# Patient Record
Sex: Female | Born: 1945 | Race: White | Hispanic: No | Smoking: Former smoker
Health system: Southern US, Community
[De-identification: ages and names within clinical notes are randomized; demographics above are authoritative.]

## PROBLEM LIST (undated history)

## (undated) DIAGNOSIS — I959 Hypotension, unspecified: Secondary | ICD-10-CM

## (undated) DIAGNOSIS — I509 Heart failure, unspecified: Secondary | ICD-10-CM

## (undated) DIAGNOSIS — E119 Type 2 diabetes mellitus without complications: Secondary | ICD-10-CM

## (undated) DIAGNOSIS — J90 Pleural effusion, not elsewhere classified: Secondary | ICD-10-CM

## (undated) DIAGNOSIS — J449 Chronic obstructive pulmonary disease, unspecified: Secondary | ICD-10-CM

## (undated) DIAGNOSIS — D649 Anemia, unspecified: Secondary | ICD-10-CM

## (undated) DIAGNOSIS — C801 Malignant (primary) neoplasm, unspecified: Secondary | ICD-10-CM

## (undated) HISTORY — DX: Chronic obstructive pulmonary disease, unspecified: J44.9

## (undated) HISTORY — PX: TONSILLECTOMY: SUR1361

## (undated) HISTORY — DX: Pleural effusion, not elsewhere classified: J90

## (undated) HISTORY — DX: Hypotension, unspecified: I95.9

## (undated) HISTORY — DX: Anemia, unspecified: D64.9

## (undated) HISTORY — DX: Heart failure, unspecified: I50.9

---

## 2016-09-21 ENCOUNTER — Emergency Department
Admission: EM | Admit: 2016-09-21 | Discharge: 2016-09-21 | Disposition: A | Discharge status: Home / Self Care | Payer: Medicare Other | Attending: Emergency Medicine | Admitting: Emergency Medicine

## 2016-09-21 ENCOUNTER — Encounter: Payer: Self-pay | Admitting: Emergency Medicine

## 2016-09-21 DIAGNOSIS — E119 Type 2 diabetes mellitus without complications: Secondary | ICD-10-CM | POA: Insufficient documentation

## 2016-09-21 DIAGNOSIS — M62830 Muscle spasm of back: Secondary | ICD-10-CM | POA: Insufficient documentation

## 2016-09-21 DIAGNOSIS — M545 Low back pain, unspecified: Secondary | ICD-10-CM

## 2016-09-21 DIAGNOSIS — F17210 Nicotine dependence, cigarettes, uncomplicated: Secondary | ICD-10-CM | POA: Insufficient documentation

## 2016-09-21 HISTORY — DX: Malignant (primary) neoplasm, unspecified: C80.1

## 2016-09-21 HISTORY — DX: Type 2 diabetes mellitus without complications: E11.9

## 2016-09-21 MED ORDER — KETOROLAC TROMETHAMINE 30 MG/ML IJ SOLN
30.0000 mg | Freq: Once | INTRAMUSCULAR | Status: AC
  Administered 2016-09-21: 30 mg via INTRAMUSCULAR
  Filled 2016-09-21: qty 1

## 2016-09-21 MED ORDER — BACLOFEN 10 MG PO TABS: 10.0000 mg | ORAL_TABLET | Freq: Three times a day (TID) | ORAL | 0 refills | Status: AC | PRN

## 2016-09-21 MED ORDER — ORPHENADRINE CITRATE 30 MG/ML IJ SOLN
60.0000 mg | Freq: Two times a day (BID) | INTRAMUSCULAR | Status: DC
  Administered 2016-09-21: 60 mg via INTRAMUSCULAR
  Filled 2016-09-21: qty 2

## 2016-09-21 MED ORDER — NAPROXEN 500 MG PO TABS: 500.0000 mg | ORAL_TABLET | Freq: Two times a day (BID) | ORAL | 0 refills | Status: DC

## 2016-09-21 NOTE — ED Triage Notes (Signed)
Patient presents to the ED with back pain.  Patient states, "it started in the middle and now it's all over."  Patient states pain began approx. 1 week ago.  Patient states back pain increases with coughing.

## 2016-09-21 NOTE — ED Notes (Addendum)
Pt reports back pain (entire back) for the last week - pt denies heavy lifting/falls/injuries - pt reports she has never had this pain before - pain is severe enough to hamper sleep and causes difficulty walking - pt denies urinary pain or frequency - pt states the pain is worse with coughing - pt has had a "cold" for the past week with a non-productive cough

## 2016-09-21 NOTE — ED Provider Notes (Signed)
[facility] Emergency Department Provider Note  ____________________________________________  Time seen: Approximately 12:35 PM  I have reviewed the triage vital signs and the nursing notes.   HISTORY  Chief Complaint Back Pain    HPI Natasha [name] is a 70 y.o. female , NAD, presents to the emergency department with 1 week history of back pain. Patient states she had back pain began about the middle section of her back approximately one week ago. Pain has been consistently worsening and spreading up and down her back since that time. Denies any injuries, traumas or falls. Has not noted any rashes, redness or swelling. Has been taking Excedrin over-the-counter which has not been assisting with her pain. Also notes she has had a cold over the last week and the cough seems to make the pain worse. Patient denies chest pain, shortness breath or wheeze. Said no abdominal pain, nausea or vomiting. Denies any numbness, weakness, tingling.   Past Medical History:  Diagnosis Date  . Cancer ([place])    lymphoma  . Diabetes mellitus without complication ([place])     There are no active problems to display for this patient.   History reviewed. No pertinent surgical history.  Prior to Admission medications   Medication Sig Start Date End Date Taking? Authorizing Provider  baclofen (LIORESAL) 10 MG tablet Take 1 tablet (10 mg total) by mouth 3 (three) times daily as needed for muscle spasms. 09/21/16 09/28/16  Stephonie Wilcoxen L Madasyn Heath, PA-C  naproxen (NAPROSYN) 500 MG tablet Take 1 tablet (500 mg total) by mouth 2 (two) times daily with a meal. 09/21/16   Kijana Cromie L Davin Muramoto, PA-C    Allergies Patient has no known allergies.  No family history on file.  Social History Social History  Substance Use Topics  . Smoking status: Current Every Day Smoker    Packs/day: 0.50    Types: Cigarettes  . Smokeless tobacco: Never Used  . Alcohol use No     Review of Systems  Constitutional: No  fever/chills ENT: Positive nasal congestion, runny nose. No ear pain, sinus pressure. Cardiovascular: No chest pain. Respiratory: Positive cough without chest congestion No shortness of breath. No wheezing.  Gastrointestinal: No abdominal pain.  No nausea, vomiting.   Musculoskeletal: Positive for back pain.  Skin: Negative for rash. Neurological: Negative for numbness, weakness, tingling.Marland Kitchen 10-point ROS otherwise negative.  ____________________________________________   PHYSICAL EXAM:  VITAL SIGNS: ED Triage Vitals  Enc Vitals Group     BP 09/21/16 1200 127/89     Pulse Rate 09/21/16 1200 95     Resp 09/21/16 1200 20     Temp 09/21/16 1200 98.6 F (37 C)     Temp Source 09/21/16 1200 Oral     SpO2 09/21/16 1200 98 %     Weight 09/21/16 1201 200 lb (90.7 kg)     Height 09/21/16 1201 5\' 5"  (1.651 m)     Head Circumference --      Peak Flow --      Pain Score 09/21/16 1201 8     Pain Loc --      Pain Edu? --      Excl. in [place]? --     Constitutional: Alert and oriented. Well appearing and in no acute distress. Eyes: Conjunctivae are normal Without icterus or injection Head: Atraumatic. ENT:      Nose: No congestion/rhinnorhea.      Mouth/Throat: Mucous membranes are moist.  Neck: Supple with full range of motion. No cervical spine  tenderness to palpation. No trapezial muscle spasms. Hematological/Lymphatic/Immunilogical: No cervical lymphadenopathy. Cardiovascular: Normal rate, regular rhythm. Normal S1 and S2. No murmurs, rubs, gallops. Good peripheral circulation. Respiratory: Normal respiratory effort without tachypnea or retractions. Lungs CTAB with breath sounds noted in all lung fields. No wheeze, rhonchi, rales. Musculoskeletal: No tenderness to palpation of the central thoracic, lumbar or sacral regions. Mid to lower thoracic muscle spasm about the right side is noted with mild tenderness to palpation. Full range of motion of the lumbar spine without pain or  difficulty. Full range of motion of bilateral upper and lower extremities without pain or difficulty. No lower extremity tenderness nor edema.  No joint effusions. Neurologic:  Normal speech and language. Normal gait and posture. No gross focal neurologic deficits are appreciated.  Skin:  Skin is warm, dry and intact. No rash, redness, swelling, skin sores or bruising noted. Psychiatric: Mood and affect are normal. Speech and behavior are normal. Patient exhibits appropriate insight and judgement.   ____________________________________________   LABS  None ____________________________________________  EKG  None ____________________________________________  RADIOLOGY  None ____________________________________________    PROCEDURES  Procedure(s) performed: None   Procedures   Medications  orphenadrine (NORFLEX) injection 60 mg (60 mg Intramuscular Given 09/21/16 1259)  ketorolac (TORADOL) 30 MG/ML injection 30 mg (30 mg Intramuscular Given 09/21/16 1259)     ____________________________________________   INITIAL IMPRESSION / ASSESSMENT AND PLAN / ED COURSE  Pertinent labs & imaging results that were available during my care of the patient were reviewed by me and considered in my medical decision making (see chart for details).  Clinical Course     Patient's diagnosis is consistent with Acute lower back pain with muscle spasms. Patient was given Toradol and Norflex while in the emergency department and noted significant decrease in pain. Patient will be discharged home with prescriptions for baclofen and Naprosyn to take as directed. Patient is to follow up with [facility] if symptoms persist past this treatment course. Patient is given ED precautions to return to the ED for any worsening or new symptoms.   ____________________________________________  FINAL CLINICAL IMPRESSION(S) / ED DIAGNOSES  Final diagnoses:  Acute bilateral low back pain without  sciatica  Muscle spasm of back      NEW MEDICATIONS STARTED DURING THIS VISIT:  Discharge Medication List as of 09/21/2016  1:53 PM    START taking these medications   Details  baclofen (LIORESAL) 10 MG tablet Take 1 tablet (10 mg total) by mouth 3 (three) times daily as needed for muscle spasms., Starting Tue 09/21/2016, Until Tue 09/28/2016, Print    naproxen (NAPROSYN) 500 MG tablet Take 1 tablet (500 mg total) by mouth 2 (two) times daily with a meal., Starting Tue 09/21/2016, [place], PA-C 09/21/16 1422    Carrie Mew, MD 09/22/16 1609

## 2016-10-03 ENCOUNTER — Emergency Department
Admission: EM | Admit: 2016-10-03 | Discharge: 2016-10-03 | Disposition: A | Discharge status: Home / Self Care | Payer: Medicare Other | Attending: Emergency Medicine | Admitting: Emergency Medicine

## 2016-10-03 ENCOUNTER — Emergency Department: Payer: Medicare Other

## 2016-10-03 ENCOUNTER — Encounter: Payer: Self-pay | Admitting: Emergency Medicine

## 2016-10-03 DIAGNOSIS — E119 Type 2 diabetes mellitus without complications: Secondary | ICD-10-CM | POA: Diagnosis not present

## 2016-10-03 DIAGNOSIS — M47816 Spondylosis without myelopathy or radiculopathy, lumbar region: Secondary | ICD-10-CM | POA: Diagnosis not present

## 2016-10-03 DIAGNOSIS — Z8572 Personal history of non-Hodgkin lymphomas: Secondary | ICD-10-CM | POA: Insufficient documentation

## 2016-10-03 DIAGNOSIS — Z791 Long term (current) use of non-steroidal anti-inflammatories (NSAID): Secondary | ICD-10-CM | POA: Insufficient documentation

## 2016-10-03 DIAGNOSIS — F17210 Nicotine dependence, cigarettes, uncomplicated: Secondary | ICD-10-CM | POA: Insufficient documentation

## 2016-10-03 DIAGNOSIS — M461 Sacroiliitis, not elsewhere classified: Secondary | ICD-10-CM | POA: Diagnosis not present

## 2016-10-03 DIAGNOSIS — M549 Dorsalgia, unspecified: Secondary | ICD-10-CM

## 2016-10-03 DIAGNOSIS — M545 Low back pain: Secondary | ICD-10-CM | POA: Diagnosis present

## 2016-10-03 MED ORDER — KETOROLAC TROMETHAMINE 60 MG/2ML IM SOLN
30.0000 mg | Freq: Once | INTRAMUSCULAR | Status: AC
  Administered 2016-10-03: 30 mg via INTRAMUSCULAR
  Filled 2016-10-03: qty 2

## 2016-10-03 MED ORDER — PREDNISONE 10 MG PO TABS: 40.0000 mg | ORAL_TABLET | Freq: Every day | ORAL | 0 refills | Status: AC

## 2016-10-03 MED ORDER — MELOXICAM 7.5 MG PO TABS: 7.5000 mg | ORAL_TABLET | Freq: Every day | ORAL | 0 refills | Status: DC

## 2016-10-03 NOTE — ED Triage Notes (Signed)
C/O low back pain.  Onset of symptoms day after thanksgiving.  Has been seen through ED for same and was given Naprosyn, muscle relaxers.   Medications helped pain, but patient has run out of medication.  Has not followed up with PCP

## 2016-10-03 NOTE — ED Provider Notes (Signed)
[facility] Emergency Department Provider Note  ____________________________________________  Time seen: Approximately 4:28 PM  I have reviewed the triage vital signs and the nursing notes.   HISTORY  Chief Complaint Back Pain    HPI Natasha [name] is a 70 y.o. female that presents with low back pain for 2 weeks. Patient states that she has back pain throughout lower back. Pain does not radiate. Pain is worse when standing up. Patient is not having any difficulty walking. Patient denies any trauma. Patient was seen in ED previously and was given baclofen and naproxen for symptoms. Patient states that these helped significanty but she has run out. Patient has not followed up with a primary care doctor. Patient denies incontinence, numbness or tingling, leg pain, nausea, vomiting, fever, weight loss.   Past Medical History:  Diagnosis Date  . 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 Diabetes mellitus without complication ([place])     There are no active problems to display for this patient.   History reviewed. No pertinent surgical history.  Prior to Admission medications   Medication Sig Start Date End Date Taking? Authorizing Provider  meloxicam (MOBIC) 7.5 MG tablet Take 1 tablet (7.5 mg total) by mouth daily. 10/03/16   Laban Emperor, PA-C  naproxen (NAPROSYN) 500 MG tablet Take 1 tablet (500 mg total) by mouth 2 (two) times daily with a meal. 09/21/16   Jami L Hagler, PA-C  predniSONE (DELTASONE) 10 MG tablet Take 4 tablets (40 mg total) by mouth daily. 10/03/16 10/08/16  Laban Emperor, PA-C    Allergies Patient has no known allergies.  No family history on file.  Social History Social History  Substance Use Topics  . Smoking status: Current Every Day Smoker    Packs/day: 0.50    Types: Cigarettes  . Smokeless tobacco: Never Used  . Alcohol use No     Review of Systems  Constitutional: No fever/chills Eyes: No visual changes. No discharge ENT: No  upper respiratory complaints. Cardiovascular: no chest pain. Respiratory: no cough. No SOB. Gastrointestinal: No abdominal pain.  No nausea, no vomiting.  No diarrhea.  No constipation. Genitourinary: Negative for dysuria.  Skin: Negative for rash, abrasions, lacerations, ecchymosis. Neurological: Negative for headaches, focal weakness or numbness.   ____________________________________________   PHYSICAL EXAM:  VITAL SIGNS: ED Triage Vitals  Enc Vitals Group     BP 10/03/16 1314 (!) 148/66     Pulse Rate 10/03/16 1314 82     Resp 10/03/16 1314 16     Temp 10/03/16 1314 98 F (36.7 C)     Temp Source 10/03/16 1314 Oral     SpO2 10/03/16 1314 100 %     Weight 10/03/16 1312 200 lb (90.7 kg)     Height 10/03/16 1312 5\' 5"  (1.651 m)     Head Circumference --      Peak Flow --      Pain Score 10/03/16 1312 10     Pain Loc --      Pain Edu? --      Excl. in [place]? --      Constitutional: Alert and oriented. Well appearing and in no acute distress. Eyes: Conjunctivae are normal. PERRL. EOMI. Head: Atraumatic. ENT:      Ears:       Nose: No congestion/rhinnorhea.      Mouth/Throat: Mucous membranes are moist.  Neck: No stridor. Cardiovascular: Normal rate, regular rhythm. Normal S1 and S2.  Good peripheral circulation. Respiratory: Normal respiratory  effort without tachypnea or retractions. Lungs CTAB. Good air entry to the bases with no decreased or absent breath sounds. Gastrointestinal: Bowel sounds 4 quadrants. Soft and nontender to palpation.  Musculoskeletal: No tenderness to palpation over cervical, thoracic, lumbar, or sacral spine. Patient has tenderness to palpation over right SI joint. No tenderness to palpation over left SI joint. And has full range of motion of upper and lower extremities. No difficulty walking. No joint effusions. Positive straight leg raise. Neurologic:  Normal speech and language. No gross focal neurologic deficits are appreciated.  Skin:  Skin  is warm, dry and intact. No rash noted. Psychiatric: Mood and affect are normal. Speech and behavior are normal. Patient exhibits appropriate insight and judgement.   ____________________________________________   LABS (all labs ordered are listed, but only abnormal results are displayed)  Labs Reviewed - No data to display ____________________________________________  EKG   ____________________________________________  RADIOLOGY Robinette Haines, personally viewed and evaluated these images (plain radiographs) as part of my medical decision making, as well as reviewing the written report by the radiologist.  Dg Lumbar Spine 2-3 Views  Result Date: 10/03/2016 CLINICAL DATA:  Low back pain, no known injury, initial encounter EXAM: LUMBAR SPINE - 3 VIEW COMPARISON:  None. FINDINGS: Five lumbar type vertebral bodies are well visualized. Vertebral body height is well maintained. Mild osteophytic changes are seen worst at the L1-2 level. No soft tissue abnormality is noted. IMPRESSION: Mild degenerative change without acute abnormality. Electronically Signed   By: Inez Catalina M.D.   On: 10/03/2016 16:10    ____________________________________________    PROCEDURES  Procedure(s) performed:    Procedures    Medications  ketorolac (TORADOL) injection 30 mg (30 mg Intramuscular Given 10/03/16 1607)     ____________________________________________   INITIAL IMPRESSION / ASSESSMENT AND PLAN / ED COURSE  Pertinent labs & imaging results that were available during my care of the patient were reviewed by me and considered in my medical decision making (see chart for details).  Review of the [place] CSRS was performed in accordance of the [place] prior to dispensing any controlled drugs.  Clinical Course     Patient's diagnosis is consistent with osteoarthritis and SI joint inflammation without radiculopathy. Patient was given Toradol in emergency department  and states that her  pain resolved. Patient will be discharged home with prescriptions for prednisone and meloxicam. Patient is to follow up with [facility] as needed or otherwise directed. Patient is given ED precautions to return to the ED for any worsening or new symptoms. No evidence of cauda hematoma, vascular catastrophe, spinal abscess/hematoma, or referred intra-abdominal pain. All patients questions questions were answered.    ____________________________________________  FINAL CLINICAL IMPRESSION(S) / ED DIAGNOSES  Final diagnoses:  Back pain  Osteoarthritis of lumbar spine, unspecified spinal osteoarthritis complication status  SI (sacroiliac) joint inflammation ([facility])      NEW MEDICATIONS STARTED DURING THIS VISIT:  New Prescriptions   MELOXICAM (MOBIC) 7.5 MG TABLET    Take 1 tablet (7.5 mg total) by mouth daily.   PREDNISONE (DELTASONE) 10 MG TABLET    Take 4 tablets (40 mg total) by mouth daily.        This chart was dictated using voice recognition software/Dragon. Despite best efforts to proofread, errors can occur which can change the meaning. Any change was purely unintentional.   Laban Emperor, PA-C 10/03/16 1810    Delman Kitten, MD 10/10/16 [phone]

## 2017-08-22 ENCOUNTER — Emergency Department: Payer: Medicare Other

## 2017-08-22 ENCOUNTER — Emergency Department
Admission: EM | Admit: 2017-08-22 | Discharge: 2017-08-23 | Disposition: A | Discharge status: Home / Self Care | Payer: Medicare Other | Attending: Emergency Medicine | Admitting: Emergency Medicine

## 2017-08-22 DIAGNOSIS — Y9389 Activity, other specified: Secondary | ICD-10-CM | POA: Insufficient documentation

## 2017-08-22 DIAGNOSIS — F17210 Nicotine dependence, cigarettes, uncomplicated: Secondary | ICD-10-CM | POA: Insufficient documentation

## 2017-08-22 DIAGNOSIS — S6991XA Unspecified injury of right wrist, hand and finger(s), initial encounter: Secondary | ICD-10-CM | POA: Diagnosis present

## 2017-08-22 DIAGNOSIS — Y92009 Unspecified place in unspecified non-institutional (private) residence as the place of occurrence of the external cause: Secondary | ICD-10-CM | POA: Diagnosis not present

## 2017-08-22 DIAGNOSIS — Z8572 Personal history of non-Hodgkin lymphomas: Secondary | ICD-10-CM | POA: Insufficient documentation

## 2017-08-22 DIAGNOSIS — E119 Type 2 diabetes mellitus without complications: Secondary | ICD-10-CM | POA: Diagnosis not present

## 2017-08-22 DIAGNOSIS — Y999 Unspecified external cause status: Secondary | ICD-10-CM | POA: Insufficient documentation

## 2017-08-22 DIAGNOSIS — W2209XA Striking against other stationary object, initial encounter: Secondary | ICD-10-CM | POA: Diagnosis not present

## 2017-08-22 MED ORDER — MELOXICAM 7.5 MG PO TABS
7.5000 mg | ORAL_TABLET | Freq: Once | ORAL | Status: AC
  Administered 2017-08-22: 7.5 mg via ORAL
  Filled 2017-08-22: qty 1

## 2017-08-22 MED ORDER — MELOXICAM 7.5 MG PO TABS: 7.5000 mg | ORAL_TABLET | Freq: Every day | ORAL | 0 refills | Status: DC

## 2017-08-22 NOTE — ED Notes (Signed)

## 2017-08-22 NOTE — ED Notes (Signed)
Pt states around last Thursday she got her hand caught down in the couch and pulled it out too quickly. States R pointer and middle finger pain. Those 2 fingers are swollen. Pt able to move slightly but not bend fully. Alert, oriented, ambulatory. Has not used ice or pain medication. States she put vicks vapor rub on it.

## 2017-08-22 NOTE — ED Provider Notes (Signed)
[facility] Emergency Department Provider Note  ____________________________________________  Time seen: Approximately 11:53 PM  I have reviewed the triage vital signs and the nursing notes.   HISTORY  Chief Complaint Hand Injury    HPI Natasha [name] is a 71 y.o. female who presents emergency department complaining of second digit pain to the right hand.  Patient reports that she was trying to get her cell phone out between 2 questions on the couch 3 days ago.  She reports that she tried to pull her hand out to quickly and injured her second finger.  Patient has had pain, swelling, ecchymosis that is now turning a "yellowish" color since the injury.  Patient reports that she has limited range of motion to the second digit of her right hand.  No numbness or tingling.  Patient has tried Vicks rubs topically but has not taken any other medication for this complaint.  No other injury or complaint at this time.  Past Medical History:  Diagnosis Date  . 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 Diabetes mellitus without complication ([place])     There are no active problems to display for this patient.   No past surgical history on file.  Prior to Admission medications   Medication Sig Start Date End Date Taking? Authorizing Provider  meloxicam (MOBIC) 7.5 MG tablet Take 1 tablet (7.5 mg total) by mouth daily. 08/22/17 08/22/18  Mikaili Flippin, Charline Bills, PA-C    Allergies Patient has no known allergies.  No family history on file.  Social History Social History  Substance Use Topics  . Smoking status: Current Every Day Smoker    Packs/day: 0.50    Types: Cigarettes  . Smokeless tobacco: Never Used  . Alcohol use No     Review of Systems  Constitutional: No fever/chills Eyes: No visual changes.  Cardiovascular: no chest pain. Respiratory: no cough. No SOB. Gastrointestinal: No abdominal pain.  No nausea, no vomiting.  Musculoskeletal: Positive for pain, swelling,  ecchymosis to the second digit of the right hand Skin: Negative for rash, abrasions, lacerations, ecchymosis. Neurological: Negative for headaches, focal weakness or numbness. 10-point ROS otherwise negative.  ____________________________________________   PHYSICAL EXAM:  VITAL SIGNS: ED Triage Vitals  Enc Vitals Group     BP 08/22/17 2148 128/85     Pulse Rate 08/22/17 2148 89     Resp 08/22/17 2148 20     Temp 08/22/17 2148 98.7 F (37.1 C)     Temp Source 08/22/17 2148 Oral     SpO2 08/22/17 2148 99 %     Weight 08/22/17 2146 210 lb (95.3 kg)     Height 08/22/17 2146 5\' 5"  (1.651 m)     Head Circumference --      Peak Flow --      Pain Score 08/22/17 2146 10     Pain Loc --      Pain Edu? --      Excl. in [place]? --      Constitutional: Alert and oriented. Well appearing and in no acute distress. Eyes: Conjunctivae are normal. PERRL. EOMI. Head: Atraumatic. Neck: No stridor.    Cardiovascular: Normal rate, regular rhythm. Normal S1 and S2.  Good peripheral circulation. Respiratory: Normal respiratory effort without tachypnea or retractions. Lungs CTAB. Good air entry to the bases with no decreased or absent breath sounds. Musculoskeletal: Full range of motion to all extremities. No gross deformities appreciated.  Patient has edema to the second and third digit  of the right hand.  Resolving ecchymosis is appreciated to the second and third digit. full range of motion to the first, third, fourth, fith digits.  The patient has good extension of the second digit but limited flexion.  Patient is very tender to palpation over the MCP joint and the PIP joint of the second digit.  All tenderness to palpation carcinoma dorsal aspect.  Patient is nontender to palpation over the flexor tendon distribution.  No palpable abnormality.  Refill and sensation intact distal digit.  There are no lacerations to the hand or digit.  No other tenderness to palpation. Neurologic:  Normal speech and  language. No gross focal neurologic deficits are appreciated.  Skin:  Skin is warm, dry and intact. No rash noted. Psychiatric: Mood and affect are normal. Speech and behavior are normal. Patient exhibits appropriate insight and judgement.   ____________________________________________   LABS (all labs ordered are listed, but only abnormal results are displayed)  Labs Reviewed - No data to display ____________________________________________  EKG   ____________________________________________  RADIOLOGY Natasha [name], personally viewed and evaluated these images (plain radiographs) as part of my medical decision making, as well as reviewing the written report by the radiologist.  Dg Hand Complete Right  Result Date: 08/22/2017 CLINICAL DATA:  71 year old female with trauma to the right hand with pain over the right index finger. EXAM: RIGHT HAND - COMPLETE 3+ VIEW COMPARISON:  None. FINDINGS: There is a faint linear oblique lucency in the proximal aspect of the fourth metacarpal, likely representing an old healed fracture. Correlation with clinical exam and point tenderness over this area is recommended. No other acute fracture identified. The bones are osteopenic. There is no dislocation. The soft tissues are grossly unremarkable. IMPRESSION: No definite acute fracture. Probable old healed fracture of the proximal fourth metacarpal. Correlation with clinical exam and point tenderness recommended. No dislocation. Electronically Signed   By: Anner Crete M.D.   On: 08/22/2017 22:21    ____________________________________________    PROCEDURES  Procedure(s) performed:    Procedures    Medications  meloxicam (MOBIC) tablet 7.5 mg (7.5 mg Oral Given 08/22/17 2353)     ____________________________________________   INITIAL IMPRESSION / ASSESSMENT AND PLAN / ED COURSE  Pertinent labs & imaging results that were available during my care of the patient were  reviewed by me and considered in my medical decision making (see chart for details).  Review of the [place] CSRS was performed in accordance of the [place] prior to dispensing any controlled drugs.     Patient's diagnosis is consistent with right hand injury.  Differential included fracture versus strain versus contusion versus ligament rupture.  X-ray reveals no acute osseous abnormality.  Patient does have a second digit.  There is some concern for ligamentous injury, patient's presentation with no tenderness to palpation along the flexor tendon, as well as mechanism of injury is not consistent with flexor tendon rupture.  At this time, finger splinted, patient will be placed on anti-inflammatories.  If after 4-5 days but there is no improvement in the flexion of the digit, patient will follow up with hand surgery for further evaluation.. Patient will be discharged home with prescriptions for meloxicam. Patient is to follow up with hand surgery as needed or otherwise directed. Patient is given ED precautions to return to the ED for any worsening or new symptoms.     ____________________________________________  FINAL CLINICAL IMPRESSION(S) / ED DIAGNOSES  Final diagnoses:  Injury of finger of right  hand, initial encounter      NEW MEDICATIONS STARTED DURING THIS VISIT:  New Prescriptions   MELOXICAM (MOBIC) 7.5 MG TABLET    Take 1 tablet (7.5 mg total) by mouth daily.        This chart was dictated using voice recognition software/Dragon. Despite best efforts to proofread, errors can occur which can change the meaning. Any change was purely unintentional.    Darletta Moll, PA-C 08/22/17 2359    Carrie Mew, MD 08/27/17 1630

## 2017-08-22 NOTE — ED Triage Notes (Signed)
Hand got caught in sofa 5 days ago and having continued pain.

## 2018-06-11 ENCOUNTER — Emergency Department
Admission: EM | Admit: 2018-06-11 | Discharge: 2018-06-11 | Disposition: A | Discharge status: Home / Self Care | Payer: Medicare HMO | Attending: Emergency Medicine | Admitting: Emergency Medicine

## 2018-06-11 ENCOUNTER — Other Ambulatory Visit: Payer: Self-pay

## 2018-06-11 DIAGNOSIS — Z8571 Personal history of Hodgkin lymphoma: Secondary | ICD-10-CM | POA: Diagnosis not present

## 2018-06-11 DIAGNOSIS — Z5320 Procedure and treatment not carried out because of patient's decision for unspecified reasons: Secondary | ICD-10-CM | POA: Insufficient documentation

## 2018-06-11 DIAGNOSIS — L0291 Cutaneous abscess, unspecified: Secondary | ICD-10-CM | POA: Diagnosis not present

## 2018-06-11 DIAGNOSIS — E119 Type 2 diabetes mellitus without complications: Secondary | ICD-10-CM | POA: Diagnosis not present

## 2018-06-11 DIAGNOSIS — F17210 Nicotine dependence, cigarettes, uncomplicated: Secondary | ICD-10-CM | POA: Insufficient documentation

## 2018-06-11 DIAGNOSIS — L02214 Cutaneous abscess of groin: Secondary | ICD-10-CM | POA: Diagnosis not present

## 2018-06-11 DIAGNOSIS — N939 Abnormal uterine and vaginal bleeding, unspecified: Secondary | ICD-10-CM | POA: Diagnosis not present

## 2018-06-11 LAB — URINALYSIS, ROUTINE W REFLEX MICROSCOPIC
Bacteria, UA: NONE SEEN
Bilirubin Urine: NEGATIVE
Glucose, UA: >=500 mg/dL — AB
Ketones, ur: NEGATIVE mg/dL
Nitrite: NEGATIVE
Protein, ur: 30 mg/dL — AB
RBC / HPF: >50 RBC/hpf — ABNORMAL HIGH (ref 0–5)
Specific Gravity, Urine: 1.022 (ref 1.005–1.030)
WBC, UA: >50 WBC/hpf — ABNORMAL HIGH (ref 0–5)
pH: 5 (ref 5.0–8.0)

## 2018-06-11 LAB — CBC
HCT: 41 % (ref 35.0–47.0)
Hemoglobin: 14.4 g/dL (ref 12.0–16.0)
MCH: 31 pg (ref 26.0–34.0)
MCHC: 35 g/dL (ref 32.0–36.0)
MCV: 88.5 fL (ref 80.0–100.0)
Platelets: 151 10*3/uL (ref 150–440)
RBC: 4.63 MIL/uL (ref 3.80–5.20)
RDW: 14 % (ref 11.5–14.5)
WBC: 7 10*3/uL (ref 3.6–11.0)

## 2018-06-11 LAB — BASIC METABOLIC PANEL
Anion gap: 5 (ref 5–15)
BUN: 13 mg/dL (ref 8–23)
CO2: 29 mmol/L (ref 22–32)
Calcium: 8.6 mg/dL — ABNORMAL LOW (ref 8.9–10.3)
Chloride: 105 mmol/L (ref 98–111)
Creatinine, Ser: 0.77 mg/dL (ref 0.44–1.00)
GFR calc Af Amer: >60 mL/min (ref >60)
GFR calc non Af Amer: >60 mL/min (ref >60)
Glucose, Bld: 260 mg/dL — ABNORMAL HIGH (ref 70–99)
Potassium: 4.3 mmol/L (ref 3.5–5.1)
Sodium: 139 mmol/L (ref 135–145)

## 2018-06-11 MED ORDER — CEPHALEXIN 500 MG PO CAPS: 500.0000 mg | ORAL_CAPSULE | Freq: Four times a day (QID) | ORAL | 0 refills | Status: AC

## 2018-06-11 NOTE — ED Provider Notes (Signed)
[facility] Emergency Department Provider Note  ____________________________________________   I have reviewed the triage vital signs and the nursing notes.   HISTORY  Chief Complaint Groin bleeding  History limited by: Not Limited   HPI Natasha [name] is a 72 y.o. female who presents to the emergency department today because of concerns for pain and bleeding from the site in her groin.  Located in the left groin.  States that the symptoms started last night after she took a bath.  She noticed then some bleeding at this morning.  She states spleen is been continuous.  It has been associated with some swelling and discomfort.  Patient has not had any fevers, nausea or vomiting.   Per medical record review patient has a history of DM  Past Medical History:  Diagnosis Date  . 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 Diabetes mellitus without complication ([place])     There are no active problems to display for this patient.   History reviewed. No pertinent surgical history.  Prior to Admission medications   Medication Sig Start Date End Date Taking? Authorizing Provider  meloxicam (MOBIC) 7.5 MG tablet Take 1 tablet (7.5 mg total) by mouth daily. 08/22/17 08/22/18  Cuthriell, Charline Bills, PA-C    Allergies Patient has no known allergies.  History reviewed. No pertinent family history.  Social History Social History   Tobacco Use  . Smoking status: Current Every Day Smoker    Packs/day: 0.50    Types: Cigarettes  . Smokeless tobacco: Never Used  Substance Use Topics  . Alcohol use: No  . Drug use: Not on file    Review of Systems Constitutional: No fever/chills Eyes: No visual changes. ENT: No sore throat. Cardiovascular: Denies chest pain. Respiratory: Denies shortness of breath. Gastrointestinal: No abdominal pain.  No nausea, no vomiting.  No diarrhea.   Genitourinary: Negative for dysuria. Musculoskeletal: Negative for back pain. Skin: Positive  for bleeding from groin Neurological: Negative for headaches, focal weakness or numbness.  ____________________________________________   PHYSICAL EXAM:  VITAL SIGNS: ED Triage Vitals [06/11/18 1305]  Enc Vitals Group     BP (!) 124/55     Pulse Rate 86     Resp 18     Temp 99.1 F (37.3 C)     Temp Source Oral     SpO2 98 %     Weight 200 lb (90.7 kg)     Height 5\' 5"  (1.651 m)     Head Circumference      Peak Flow      Pain Score 5   Constitutional: Alert and oriented.  Eyes: Conjunctivae are normal.  ENT      Head: Normocephalic and atraumatic.      Nose: No congestion/rhinnorhea.      Mouth/Throat: Mucous membranes are moist.      Neck: No stridor. Hematological/Lymphatic/Immunilogical: No cervical lymphadenopathy. Cardiovascular: Normal rate, regular rhythm.  No murmurs, rubs, or gallops.  Respiratory: Normal respiratory effort without tachypnea nor retractions. Breath sounds are clear and equal bilaterally. No wheezes/rales/rhonchi. Gastrointestinal: Soft and non tender. No rebound. No guarding.  Genitourinary: Left groin area of fluctuance and small open area with spontaneous drainage of blood. Tender to palpation.  Musculoskeletal: Normal range of motion in all extremities. No lower extremity edema. Neurologic:  Normal speech and language. No gross focal neurologic deficits are appreciated.  Skin:  Erythema and drainage to left groin. Psychiatric: Mood and affect are normal. Speech and behavior are  normal. Patient exhibits appropriate insight and judgment.  ____________________________________________    LABS (pertinent positives/negatives)  BMP wnl except glu 260, ca 8.6 CBC wbc 7.0, hgb 14.4, plt 151 UA cloudy, large urine dipstick, large leukocytes,  >50 rbc and wbc, 11-20 squamous epithelial cells, present wbc clumps ____________________________________________   EKG  None  ____________________________________________     RADIOLOGY  None  ____________________________________________   PROCEDURES  Procedures  ____________________________________________   INITIAL IMPRESSION / ASSESSMENT AND PLAN / ED COURSE  Pertinent labs & imaging results that were available during my care of the patient were reviewed by me and considered in my medical decision making (see chart for details).   Patient presented to the emergency department today because of concerns for bleeding swelling and tenderness to the left groin.  Exam is consistent with an abscess.  There is small amount of spontaneous sanguinous discharge.  Did have a discussion with the patient.  Did highly recommend incision and drainage.  Patient however declined.  We did discuss risk of worsening infection including sepsis and death.  Patient continued to decline incision and drainage.  We will at least place the patient on antibiotics.  In addition UA does show some white blood cells which I think could be secondary to the abscess however will place on antibiotics that could cover both the abscess and a potential urinary tract infection.  ____________________________________________   FINAL CLINICAL IMPRESSION(S) / ED DIAGNOSES  Final diagnoses:  Abscess     Note: This dictation was prepared with Dragon dictation. Any transcriptional errors that result from this process are unintentional     Nance Pear, MD 06/11/18 1630

## 2018-06-11 NOTE — ED Triage Notes (Signed)
Pt arrives [facility] from home. States last night she took a bath with essential oils (bath salts). States woke up this AM with vaginal swelling. States on L side. States vaginal bleeding. Alert, oriented, ambulatory.

## 2018-06-11 NOTE — Discharge Instructions (Signed)
Please seek medical attention for any high fevers, chest pain, shortness of breath, change in behavior, persistent vomiting, bloody stool or any other new or concerning symptoms.  

## 2018-06-11 NOTE — ED Notes (Signed)
Pt refused vital signs.

## 2018-07-07 ENCOUNTER — Ambulatory Visit (INDEPENDENT_AMBULATORY_CARE_PROVIDER_SITE_OTHER): Payer: Medicare HMO | Admitting: Family Medicine

## 2018-07-07 ENCOUNTER — Encounter: Payer: Self-pay | Admitting: Family Medicine

## 2018-07-07 VITALS — BP 116/64 | HR 86 | Temp 98.0°F | Resp 18 | Ht 63.25 in | Wt 178.8 lb

## 2018-07-07 DIAGNOSIS — E2839 Other primary ovarian failure: Secondary | ICD-10-CM | POA: Diagnosis not present

## 2018-07-07 DIAGNOSIS — Z1239 Encounter for other screening for malignant neoplasm of breast: Secondary | ICD-10-CM

## 2018-07-07 DIAGNOSIS — Z23 Encounter for immunization: Secondary | ICD-10-CM | POA: Diagnosis not present

## 2018-07-07 DIAGNOSIS — Z1159 Encounter for screening for other viral diseases: Secondary | ICD-10-CM | POA: Diagnosis not present

## 2018-07-07 DIAGNOSIS — Z1231 Encounter for screening mammogram for malignant neoplasm of breast: Secondary | ICD-10-CM

## 2018-07-07 DIAGNOSIS — E669 Obesity, unspecified: Secondary | ICD-10-CM | POA: Diagnosis not present

## 2018-07-07 DIAGNOSIS — L602 Onychogryphosis: Secondary | ICD-10-CM

## 2018-07-07 DIAGNOSIS — R6889 Other general symptoms and signs: Secondary | ICD-10-CM | POA: Diagnosis not present

## 2018-07-07 DIAGNOSIS — Z720 Tobacco use: Secondary | ICD-10-CM | POA: Diagnosis not present

## 2018-07-07 DIAGNOSIS — R252 Cramp and spasm: Secondary | ICD-10-CM | POA: Diagnosis not present

## 2018-07-07 DIAGNOSIS — D692 Other nonthrombocytopenic purpura: Secondary | ICD-10-CM | POA: Insufficient documentation

## 2018-07-07 DIAGNOSIS — M170 Bilateral primary osteoarthritis of knee: Secondary | ICD-10-CM | POA: Diagnosis not present

## 2018-07-07 DIAGNOSIS — M79604 Pain in right leg: Secondary | ICD-10-CM | POA: Diagnosis not present

## 2018-07-07 DIAGNOSIS — E119 Type 2 diabetes mellitus without complications: Secondary | ICD-10-CM | POA: Diagnosis not present

## 2018-07-07 DIAGNOSIS — M79605 Pain in left leg: Secondary | ICD-10-CM

## 2018-07-07 DIAGNOSIS — R634 Abnormal weight loss: Secondary | ICD-10-CM

## 2018-07-07 DIAGNOSIS — E1169 Type 2 diabetes mellitus with other specified complication: Secondary | ICD-10-CM | POA: Diagnosis not present

## 2018-07-07 DIAGNOSIS — R1013 Epigastric pain: Secondary | ICD-10-CM

## 2018-07-07 MED ORDER — OMEPRAZOLE 40 MG PO CPDR: 40.0000 mg | DELAYED_RELEASE_CAPSULE | Freq: Every day | ORAL | 0 refills | Status: DC

## 2018-07-07 NOTE — Patient Instructions (Signed)
Diabetes Mellitus and Nutrition When you have diabetes (diabetes mellitus), it is very important to have healthy eating habits because your blood sugar (glucose) levels are greatly affected by what you eat and drink. Eating healthy foods in the appropriate amounts, at about the same times every day, can help you:  Control your blood glucose.  Lower your risk of heart disease.  Improve your blood pressure.  Reach or maintain a healthy weight.  Every person with diabetes is different, and each person has different needs for a meal plan. Your health care provider may recommend that you work with a diet and nutrition specialist (dietitian) to make a meal plan that is best for you. Your meal plan may vary depending on factors such as:  The calories you need.  The medicines you take.  Your weight.  Your blood glucose, blood pressure, and cholesterol levels.  Your activity level.  Other health conditions you have, such as heart or kidney disease.  How do carbohydrates affect me? Carbohydrates affect your blood glucose level more than any other type of food. Eating carbohydrates naturally increases the amount of glucose in your blood. Carbohydrate counting is a method for keeping track of how many carbohydrates you eat. Counting carbohydrates is important to keep your blood glucose at a healthy level, especially if you use insulin or take certain oral diabetes medicines. It is important to know how many carbohydrates you can safely have in each meal. This is different for every person. Your dietitian can help you calculate how many carbohydrates you should have at each meal and for snack. Foods that contain carbohydrates include:  Bread, cereal, rice, pasta, and crackers.  Potatoes and corn.  Peas, beans, and lentils.  Milk and yogurt.  Fruit and juice.  Desserts, such as cakes, cookies, ice cream, and candy.  How does alcohol affect me? Alcohol can cause a sudden decrease in blood  glucose (hypoglycemia), especially if you use insulin or take certain oral diabetes medicines. Hypoglycemia can be a life-threatening condition. Symptoms of hypoglycemia (sleepiness, dizziness, and confusion) are similar to symptoms of having too much alcohol. If your health care provider says that alcohol is safe for you, follow these guidelines:  Limit alcohol intake to no more than 1 drink per day for nonpregnant women and 2 drinks per day for men. One drink equals 12 oz of beer, 5 oz of wine, or 1 oz of hard liquor.  Do not drink on an empty stomach.  Keep yourself hydrated with water, diet soda, or unsweetened iced tea.  Keep in mind that regular soda, juice, and other mixers may contain a lot of sugar and must be counted as carbohydrates.  What are tips for following this plan? Reading food labels  Start by checking the serving size on the label. The amount of calories, carbohydrates, fats, and other nutrients listed on the label are based on one serving of the food. Many foods contain more than one serving per package.  Check the total grams (g) of carbohydrates in one serving. You can calculate the number of servings of carbohydrates in one serving by dividing the total carbohydrates by 15. For example, if a food has 30 g of total carbohydrates, it would be equal to 2 servings of carbohydrates.  Check the number of grams (g) of saturated and trans fats in one serving. Choose foods that have low or no amount of these fats.  Check the number of milligrams (mg) of sodium in one serving. Most people   should limit total sodium intake to less than 2,300 mg per day.  Always check the nutrition information of foods labeled as "low-fat" or "nonfat". These foods may be higher in added sugar or refined carbohydrates and should be avoided.  Talk to your dietitian to identify your daily goals for nutrients listed on the label. Shopping  Avoid buying canned, premade, or processed foods. These  foods tend to be high in fat, sodium, and added sugar.  Shop around the outside edge of the grocery store. This includes fresh fruits and vegetables, bulk grains, fresh meats, and fresh dairy. Cooking  Use low-heat cooking methods, such as baking, instead of high-heat cooking methods like deep frying.  Cook using healthy oils, such as olive, canola, or sunflower oil.  Avoid cooking with butter, cream, or high-fat meats. Meal planning  Eat meals and snacks regularly, preferably at the same times every day. Avoid going long periods of time without eating.  Eat foods high in fiber, such as fresh fruits, vegetables, beans, and whole grains. Talk to your dietitian about how many servings of carbohydrates you can eat at each meal.  Eat 4-6 ounces of lean protein each day, such as lean meat, chicken, fish, eggs, or tofu. 1 ounce is equal to 1 ounce of meat, chicken, or fish, 1 egg, or 1/4 cup of tofu.  Eat some foods each day that contain healthy fats, such as avocado, nuts, seeds, and fish. Lifestyle   Check your blood glucose regularly.  Exercise at least 30 minutes 5 or more days each week, or as told by your health care provider.  Take medicines as told by your health care provider.  Do not use any products that contain nicotine or tobacco, such as cigarettes and e-cigarettes. If you need help quitting, ask your health care provider.  Work with a counselor or diabetes educator to identify strategies to manage stress and any emotional and social challenges. What are some questions to ask my health care provider?  Do I need to meet with a diabetes educator?  Do I need to meet with a dietitian?  What number can I call if I have questions?  When are the best times to check my blood glucose? Where to find more information:  American Diabetes Association: diabetes.org/food-and-fitness/food  Academy of Nutrition and Dietetics:  www.eatright.org/resources/health/diseases-and-conditions/diabetes  National Institute of Diabetes and Digestive and Kidney Diseases (NIH): www.niddk.nih.gov/health-information/diabetes/overview/diet-eating-physical-activity Summary  A healthy meal plan will help you control your blood glucose and maintain a healthy lifestyle.  Working with a diet and nutrition specialist (dietitian) can help you make a meal plan that is best for you.  Keep in mind that carbohydrates and alcohol have immediate effects on your blood glucose levels. It is important to count carbohydrates and to use alcohol carefully. This information is not intended to replace advice given to you by your health care provider. Make sure you discuss any questions you have with your health care provider. Document Released: 07/08/2005 Document Revised: 11/15/2016 Document Reviewed: 11/15/2016 Elsevier Interactive Patient Education  2018 Elsevier Inc.  
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
## 2018-07-08 LAB — COMPLETE METABOLIC PANEL WITH GFR
AG Ratio: 2.1 (calc) (ref 1.0–2.5)
ALT: 7 U/L (ref 6–29)
AST: 8 U/L — ABNORMAL LOW (ref 10–35)
Albumin: 3.9 g/dL (ref 3.6–5.1)
Alkaline phosphatase (APISO): 101 U/L (ref 33–130)
BUN: 17 mg/dL (ref 7–25)
CO2: 28 mmol/L (ref 20–32)
Calcium: 9.2 mg/dL (ref 8.6–10.4)
Chloride: 107 mmol/L (ref 98–110)
Creat: 0.92 mg/dL (ref 0.60–0.93)
GFR, Est African American: 73 mL/min/{1.73_m2} (ref >=60)
GFR, Est Non African American: 63 mL/min/{1.73_m2} (ref >=60)
Globulin: 1.9 g/dL (calc) (ref 1.9–3.7)
Glucose, Bld: 324 mg/dL — ABNORMAL HIGH (ref 65–99)
Potassium: 4.1 mmol/L (ref 3.5–5.3)
Sodium: 139 mmol/L (ref 135–146)
Total Bilirubin: 0.6 mg/dL (ref 0.2–1.2)
Total Protein: 5.8 g/dL — ABNORMAL LOW (ref 6.1–8.1)

## 2018-07-08 LAB — LIPID PANEL
Cholesterol: 119 mg/dL (ref <200)
HDL: 25 mg/dL — ABNORMAL LOW (ref >50)
LDL Cholesterol (Calc): 70 mg/dL (calc)
Non-HDL Cholesterol (Calc): 94 mg/dL (calc) (ref <130)
Total CHOL/HDL Ratio: 4.8 (calc) (ref <5.0)
Triglycerides: 153 mg/dL — ABNORMAL HIGH (ref <150)

## 2018-07-08 LAB — HEMOGLOBIN A1C
Hgb A1c MFr Bld: 9 % of total Hgb — ABNORMAL HIGH (ref <5.7)
Mean Plasma Glucose: 212 (calc)
eAG (mmol/L): 11.7 (calc)

## 2018-07-08 LAB — CBC WITH DIFFERENTIAL/PLATELET
Basophils Absolute: 29 cells/uL (ref 0–200)
Basophils Relative: 0.6 %
Eosinophils Absolute: 110 cells/uL (ref 15–500)
Eosinophils Relative: 2.3 %
HCT: 42.8 % (ref 35.0–45.0)
Hemoglobin: 14.5 g/dL (ref 11.7–15.5)
Lymphs Abs: 1104 cells/uL (ref 850–3900)
MCH: 30.3 pg (ref 27.0–33.0)
MCHC: 33.9 g/dL (ref 32.0–36.0)
MCV: 89.4 fL (ref 80.0–100.0)
MPV: 10.4 fL (ref 7.5–12.5)
Monocytes Relative: 7.7 %
Neutro Abs: 3187 cells/uL (ref 1500–7800)
Neutrophils Relative %: 66.4 %
Platelets: 177 10*3/uL (ref 140–400)
RBC: 4.79 10*6/uL (ref 3.80–5.10)
RDW: 13.2 % (ref 11.0–15.0)
Total Lymphocyte: 23 %
WBC mixed population: 370 cells/uL (ref 200–950)
WBC: 4.8 10*3/uL (ref 3.8–10.8)

## 2018-07-08 LAB — HEPATITIS C ANTIBODY
Hepatitis C Ab: NONREACTIVE
SIGNAL TO CUT-OFF: 0.01 (ref <1.00)

## 2018-07-08 LAB — MICROALBUMIN / CREATININE URINE RATIO
Creatinine, Urine: 94 mg/dL (ref 20–275)
Microalb Creat Ratio: 7 mcg/mg creat (ref <30)
Microalb, Ur: 0.7 mg/dL

## 2018-07-08 LAB — MAGNESIUM: Magnesium: 1.7 mg/dL (ref 1.5–2.5)

## 2018-07-10 ENCOUNTER — Encounter: Payer: Self-pay | Admitting: Gastroenterology

## 2018-07-10 ENCOUNTER — Telehealth: Payer: Self-pay | Admitting: Family Medicine

## 2018-07-10 NOTE — Telephone Encounter (Signed)
Referral was placed during her visit. Can you check and see if done?

## 2018-07-10 NOTE — Telephone Encounter (Signed)
Copied from [place] [id number]. Topic: Quick Communication - See Telephone Encounter >> Jul 10, 2018 10:11 AM Blase Mess A wrote: CRM for notification. See Telephone encounter for: 07/10/18. Patient called to request a referral  from [place] [facility] [place], [place] [address] [phone]  Please advise Patients call back [phone]

## 2018-07-17 DIAGNOSIS — R6889 Other general symptoms and signs: Secondary | ICD-10-CM | POA: Diagnosis not present

## 2018-07-24 ENCOUNTER — Ambulatory Visit (INDEPENDENT_AMBULATORY_CARE_PROVIDER_SITE_OTHER): Payer: Medicare HMO | Admitting: Podiatry

## 2018-07-24 ENCOUNTER — Encounter: Payer: Self-pay | Admitting: Podiatry

## 2018-07-24 VITALS — BP 127/82 | HR 73 | Resp 16

## 2018-07-24 DIAGNOSIS — R6889 Other general symptoms and signs: Secondary | ICD-10-CM | POA: Diagnosis not present

## 2018-07-24 DIAGNOSIS — M79676 Pain in unspecified toe(s): Secondary | ICD-10-CM | POA: Diagnosis not present

## 2018-07-24 DIAGNOSIS — B351 Tinea unguium: Secondary | ICD-10-CM

## 2018-07-24 NOTE — Progress Notes (Signed)
  Subjective:  Patient ID: Natasha [name], female    DOB: 1946/05/08,  MRN: [id number] HPI Chief Complaint  Patient presents with  . Debridement    Requesting nail care-nails are long and thick, unable to cut die to back issues  . New Patient (Initial Visit)    72 y.o. female presents with the above complaint.   ROS: Denies fever chills nausea vomiting muscle aches pains relates positive back pain negative chest pain shortness of breath or headache.  Past Medical History:  Diagnosis Date  . Cancer ([place])    lymphoma  . Diabetes mellitus without complication [facility])    Past Surgical History:  Procedure Laterality Date  . TONSILLECTOMY Bilateral    as a child    Current Outpatient Medications:  .  omeprazole (PRILOSEC) 40 MG capsule, Take 1 capsule (40 mg total) by mouth daily., Disp: 30 capsule, Rfl: 0  No Known Allergies Review of Systems Objective:   Vitals:   07/24/18 0817  BP: 127/82  Pulse: 73  Resp: 16    General: Well developed, nourished, in no acute distress, alert and oriented x3   Dermatological: Skin is warm, dry and supple bilateral. Nails x 10 are well maintained; remaining integument appears unremarkable at this time. There are no open sores, no preulcerative lesions, no rash or signs of infection present.  Toenails are grossly thickened brittle mycotic with subungual debris severely elongated.  No open lesions or wounds.  Vascular: Dorsalis Pedis artery and Posterior Tibial artery pedal pulses are 2/4 bilateral with immedate capillary fill time. Pedal hair growth present. No varicosities and no lower extremity edema present bilateral.   Neruologic: Grossly intact via light touch bilateral. Vibratory intact via tuning fork bilateral. Protective threshold with Semmes Wienstein monofilament intact to all pedal sites bilateral. Patellar and Achilles deep tendon reflexes 2+ bilateral. No Babinski or clonus noted bilateral.   Musculoskeletal: No gross boney pedal  deformities bilateral. No pain, crepitus, or limitation noted with foot and ankle range of motion bilateral. Muscular strength 5/5 in all groups tested bilateral.  Gait: Unassisted, Nonantalgic.    Radiographs:  None taken  Assessment & Plan:   Assessment: Onychomycosis onychogryphosis.    Plan: Debridement of toenails 1 through 5 bilateral secondary to pain refer to Dr. Prudence [name] for nail care.     Natasha [name], [place]

## 2018-08-14 ENCOUNTER — Other Ambulatory Visit: Payer: Self-pay

## 2018-08-14 ENCOUNTER — Encounter: Payer: Self-pay | Admitting: Family Medicine

## 2018-08-14 ENCOUNTER — Ambulatory Visit (INDEPENDENT_AMBULATORY_CARE_PROVIDER_SITE_OTHER): Payer: Medicare HMO | Admitting: *Deleted

## 2018-08-14 ENCOUNTER — Ambulatory Visit
Admission: RE | Admit: 2018-08-14 | Discharge: 2018-08-14 | Disposition: A | Discharge status: Home / Self Care | Payer: Medicare HMO | Source: Ambulatory Visit | Attending: Family Medicine | Admitting: Family Medicine

## 2018-08-14 ENCOUNTER — Ambulatory Visit (INDEPENDENT_AMBULATORY_CARE_PROVIDER_SITE_OTHER): Payer: Medicare HMO | Admitting: Family Medicine

## 2018-08-14 VITALS — BP 116/78 | HR 89 | Temp 97.5°F | Resp 18 | Ht 63.0 in | Wt 181.0 lb

## 2018-08-14 DIAGNOSIS — M79662 Pain in left lower leg: Secondary | ICD-10-CM

## 2018-08-14 DIAGNOSIS — M461 Sacroiliitis, not elsewhere classified: Secondary | ICD-10-CM | POA: Insufficient documentation

## 2018-08-14 DIAGNOSIS — R590 Localized enlarged lymph nodes: Secondary | ICD-10-CM | POA: Diagnosis not present

## 2018-08-14 DIAGNOSIS — E1169 Type 2 diabetes mellitus with other specified complication: Secondary | ICD-10-CM

## 2018-08-14 DIAGNOSIS — R252 Cramp and spasm: Secondary | ICD-10-CM

## 2018-08-14 DIAGNOSIS — E669 Obesity, unspecified: Secondary | ICD-10-CM | POA: Diagnosis not present

## 2018-08-14 DIAGNOSIS — E785 Hyperlipidemia, unspecified: Secondary | ICD-10-CM | POA: Diagnosis not present

## 2018-08-14 DIAGNOSIS — R1013 Epigastric pain: Secondary | ICD-10-CM | POA: Diagnosis not present

## 2018-08-14 DIAGNOSIS — R6889 Other general symptoms and signs: Secondary | ICD-10-CM | POA: Diagnosis not present

## 2018-08-14 DIAGNOSIS — M79605 Pain in left leg: Secondary | ICD-10-CM

## 2018-08-14 DIAGNOSIS — D692 Other nonthrombocytopenic purpura: Secondary | ICD-10-CM

## 2018-08-14 DIAGNOSIS — M79604 Pain in right leg: Secondary | ICD-10-CM

## 2018-08-14 DIAGNOSIS — R600 Localized edema: Secondary | ICD-10-CM | POA: Diagnosis not present

## 2018-08-14 DIAGNOSIS — M170 Bilateral primary osteoarthritis of knee: Secondary | ICD-10-CM

## 2018-08-14 MED ORDER — FREESTYLE LANCETS MISC: 12 refills | Status: DC

## 2018-08-14 MED ORDER — GLUCOSE BLOOD VI STRP: ORAL_STRIP | 12 refills | Status: DC

## 2018-08-14 MED ORDER — FREESTYLE SYSTEM KIT: 1.0000 | PACK | 0 refills | Status: DC | PRN

## 2018-08-14 MED ORDER — METFORMIN HCL ER 750 MG PO TB24: 1500.0000 mg | ORAL_TABLET | Freq: Every day | ORAL | 0 refills | Status: DC

## 2018-08-14 MED ORDER — GLIPIZIDE ER 5 MG PO TB24: 5.0000 mg | ORAL_TABLET | Freq: Every day | ORAL | 0 refills | Status: DC

## 2018-08-14 MED ORDER — OMEPRAZOLE 40 MG PO CPDR: 40.0000 mg | DELAYED_RELEASE_CAPSULE | Freq: Every day | ORAL | 0 refills | Status: DC

## 2018-08-14 MED ORDER — ROPINIROLE HCL 1 MG PO TABS: 1.0000 mg | ORAL_TABLET | Freq: Every day | ORAL | 0 refills | Status: DC

## 2018-08-14 NOTE — Patient Instructions (Signed)
Diabetes Mellitus and Nutrition When you have diabetes (diabetes mellitus), it is very important to have healthy eating habits because your blood sugar (glucose) levels are greatly affected by what you eat and drink. Eating healthy foods in the appropriate amounts, at about the same times every day, can help you:  Control your blood glucose.  Lower your risk of heart disease.  Improve your blood pressure.  Reach or maintain a healthy weight.  Every person with diabetes is different, and each person has different needs for a meal plan. Your health care provider may recommend that you work with a diet and nutrition specialist (dietitian) to make a meal plan that is best for you. Your meal plan may vary depending on factors such as:  The calories you need.  The medicines you take.  Your weight.  Your blood glucose, blood pressure, and cholesterol levels.  Your activity level.  Other health conditions you have, such as heart or kidney disease.  How do carbohydrates affect me? Carbohydrates affect your blood glucose level more than any other type of food. Eating carbohydrates naturally increases the amount of glucose in your blood. Carbohydrate counting is a method for keeping track of how many carbohydrates you eat. Counting carbohydrates is important to keep your blood glucose at a healthy level, especially if you use insulin or take certain oral diabetes medicines. It is important to know how many carbohydrates you can safely have in each meal. This is different for every person. Your dietitian can help you calculate how many carbohydrates you should have at each meal and for snack. Foods that contain carbohydrates include:  Bread, cereal, rice, pasta, and crackers.  Potatoes and corn.  Peas, beans, and lentils.  Milk and yogurt.  Fruit and juice.  Desserts, such as cakes, cookies, ice cream, and candy.  How does alcohol affect me? Alcohol can cause a sudden decrease in blood  glucose (hypoglycemia), especially if you use insulin or take certain oral diabetes medicines. Hypoglycemia can be a life-threatening condition. Symptoms of hypoglycemia (sleepiness, dizziness, and confusion) are similar to symptoms of having too much alcohol. If your health care provider says that alcohol is safe for you, follow these guidelines:  Limit alcohol intake to no more than 1 drink per day for nonpregnant women and 2 drinks per day for men. One drink equals 12 oz of beer, 5 oz of wine, or 1 oz of hard liquor.  Do not drink on an empty stomach.  Keep yourself hydrated with water, diet soda, or unsweetened iced tea.  Keep in mind that regular soda, juice, and other mixers may contain a lot of sugar and must be counted as carbohydrates.  What are tips for following this plan? Reading food labels  Start by checking the serving size on the label. The amount of calories, carbohydrates, fats, and other nutrients listed on the label are based on one serving of the food. Many foods contain more than one serving per package.  Check the total grams (g) of carbohydrates in one serving. You can calculate the number of servings of carbohydrates in one serving by dividing the total carbohydrates by 15. For example, if a food has 30 g of total carbohydrates, it would be equal to 2 servings of carbohydrates.  Check the number of grams (g) of saturated and trans fats in one serving. Choose foods that have low or no amount of these fats.  Check the number of milligrams (mg) of sodium in one serving. Most people   should limit total sodium intake to less than 2,300 mg per day.  Always check the nutrition information of foods labeled as "low-fat" or "nonfat". These foods may be higher in added sugar or refined carbohydrates and should be avoided.  Talk to your dietitian to identify your daily goals for nutrients listed on the label. Shopping  Avoid buying canned, premade, or processed foods. These  foods tend to be high in fat, sodium, and added sugar.  Shop around the outside edge of the grocery store. This includes fresh fruits and vegetables, bulk grains, fresh meats, and fresh dairy. Cooking  Use low-heat cooking methods, such as baking, instead of high-heat cooking methods like deep frying.  Cook using healthy oils, such as olive, canola, or sunflower oil.  Avoid cooking with butter, cream, or high-fat meats. Meal planning  Eat meals and snacks regularly, preferably at the same times every day. Avoid going long periods of time without eating.  Eat foods high in fiber, such as fresh fruits, vegetables, beans, and whole grains. Talk to your dietitian about how many servings of carbohydrates you can eat at each meal.  Eat 4-6 ounces of lean protein each day, such as lean meat, chicken, fish, eggs, or tofu. 1 ounce is equal to 1 ounce of meat, chicken, or fish, 1 egg, or 1/4 cup of tofu.  Eat some foods each day that contain healthy fats, such as avocado, nuts, seeds, and fish. Lifestyle   Check your blood glucose regularly.  Exercise at least 30 minutes 5 or more days each week, or as told by your health care provider.  Take medicines as told by your health care provider.  Do not use any products that contain nicotine or tobacco, such as cigarettes and e-cigarettes. If you need help quitting, ask your health care provider.  Work with a counselor or diabetes educator to identify strategies to manage stress and any emotional and social challenges. What are some questions to ask my health care provider?  Do I need to meet with a diabetes educator?  Do I need to meet with a dietitian?  What number can I call if I have questions?  When are the best times to check my blood glucose? Where to find more information:  American Diabetes Association: diabetes.org/food-and-fitness/food  Academy of Nutrition and Dietetics:  www.eatright.org/resources/health/diseases-and-conditions/diabetes  National Institute of Diabetes and Digestive and Kidney Diseases (NIH): www.niddk.nih.gov/health-information/diabetes/overview/diet-eating-physical-activity Summary  A healthy meal plan will help you control your blood glucose and maintain a healthy lifestyle.  Working with a diet and nutrition specialist (dietitian) can help you make a meal plan that is best for you.  Keep in mind that carbohydrates and alcohol have immediate effects on your blood glucose levels. It is important to count carbohydrates and to use alcohol carefully. This information is not intended to replace advice given to you by your health care provider. Make sure you discuss any questions you have with your health care provider. Document Released: 07/08/2005 Document Revised: 11/15/2016 Document Reviewed: 11/15/2016 Elsevier Interactive Patient Education  2018 Elsevier Inc.  

## 2018-08-14 NOTE — Progress Notes (Signed)
Name: Natasha [name]   MRN: [id number]    DOB: 03-31-1946   Date:08/14/2018       Progress Note  Subjective  Chief Complaint  Chief Complaint  Patient presents with  . Follow-up    1 month f/u  . Diabetes  . Leg Pain    lots of muscle cramps    HPI  DMII: she states she was diagnosed over 3 years ago when she was in [place]. She was given a prescription but never filled it. First visit with me was 06/2018, hgbA1C of 9, she refuses any type of injectable therapy. Urine micro was normal, her glucose was 324 on her last visit, normal kidney function, she has dyslipidemia with very low HDL, discussed statin but she refuses at this time. Discussed basal insulin but she is resistant, willing to try changing diet and also starting on oral diabetes medications. She states she does not have transportation so [facility] her [facility] can be delivered to her house. .   OA knee: she states her knees locks at times, has daily leg pain, lot os spasms. She states she has seen vascular doctors, and other providers and every one told her something different. She states she has cramps, legs feels sore all the time. We will try requip since she has spasms, pain is constant, at rest or during activity. Both legs. She asked for pain medication.  Toe nails: seen by podiatrist and doing better.   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 She  states can drink but cannot eat, after a couple of bites she has severe epigastric pain and has to stop otherwise gets nauseated. No blood in stools, no regurgitation. Pain in epigastric area is described as sharp, no radiation, but intense. She refuses referral to GI. She drinks coffee and sodas. Explained that caffeine is not good for her stomach. She states she drinks coffee and cigarettes for breakfast, coke for lunch and eats a meal for  dinner - last night she had spaghetti.    Patient Active Problem List   Diagnosis Date Noted  . SI (sacroiliac) joint inflammation ([place]) 08/14/2018  . Diabetes mellitus type 2, diet-controlled ([place]) 07/07/2018  . Primary osteoarthritis of both knees 07/07/2018  . Leg cramping 07/07/2018  . Senile purpura ([place]) 07/07/2018    Past Surgical History:  Procedure Laterality Date  . TONSILLECTOMY Bilateral    as a child    Family History  Problem Relation Age of Onset  . Cancer Mother        breast  . Cancer Father        lung  . Alzheimer's disease Father   . Varicose Veins Brother     Social History   Socioeconomic History  . Marital status: Widowed    Spouse name: Not on file  . Number of children: 2  . Years of education: Not on file  . Highest education level: 9th grade  Occupational History  . Occupation: unemployed  Social Needs  . Financial resource strain: Very hard  . Food insecurity:    Worry: Often true    Inability: Often true  . Transportation needs:    Medical: No    Non-medical: No  Tobacco Use  . Smoking status: Current Every Day Smoker    Packs/day: 0.50    Types: Cigarettes  Start date: 07/07/1961  . Smokeless tobacco: Never Used  Substance and Sexual Activity  . Alcohol use: No  . Drug use: Never  . Sexual activity: Not Currently    Partners: Male  Lifestyle  . Physical activity:    Days per week: 7 days    Minutes per session: 10 min  . Stress: Not at all  Relationships  . Social connections:    Talks on phone: Never    Gets together: More than three times a week    Attends religious service: More than 4 times per year    Active member of club or organization: No    Attends meetings of clubs or organizations: Never    Relationship status: Widowed  . Intimate partner violence:    Fear of current or ex partner: No    Emotionally abused: No    Physically abused: No    Forced sexual activity: No  Other Topics Concern  . Not on  file  Social History Narrative   Patient just moved here from [place] and lives with her daughter.      She has 1 son, 1 daughter, 8 grandchildren, & 2 great-grandchildren.     Current Outpatient Medications:  .  glipiZIDE (GLUCOTROL XL) 5 MG 24 hr tablet, Take 1 tablet (5 mg total) by mouth daily with breakfast., Disp: 90 tablet, Rfl: 0 .  metFORMIN (GLUCOPHAGE-XR) 750 MG 24 hr tablet, Take 2 tablets (1,500 mg total) by mouth daily with breakfast., Disp: 180 tablet, Rfl: 0 .  omeprazole (PRILOSEC) 40 MG capsule, Take 1 capsule (40 mg total) by mouth daily., Disp: 30 capsule, Rfl: 0 .  rOPINIRole (REQUIP) 1 MG tablet, Take 1 tablet (1 mg total) by mouth at bedtime., Disp: 90 tablet, Rfl: 0  No Known Allergies  I personally reviewed active problem list, medication list, allergies, family history, social history, health maintenance with the patient/caregiver today.   ROS  Constitutional: Negative for fever or weight change.  Respiratory: positive  for cough but no  shortness of breath.   Cardiovascular: Negative for chest pain or palpitations.  Gastrointestinal: Negative for abdominal pain, no bowel changes.  Musculoskeletal: positive for gait problem but no  joint swelling.  Skin: Negative for rash.  Neurological: Negative for dizziness or headache.  No other specific complaints in a complete review of systems (except as listed in HPI above).  Objective  Vitals:   08/14/18 0901  BP: 116/78  Pulse: 89  Resp: 18  Temp: (!) 97.5 F (36.4 C)  TempSrc: Oral  SpO2: 95%  Weight: 181 lb (82.1 kg)  Height: '5\' 3"'  (1.6 m)    Body mass index is 32.06 kg/m.  Physical Exam  Constitutional: Patient appears well-developed and well-nourished. Obese No distress.  HEENT: head atraumatic, normocephalic, pupils equal and reactive to light, neck supple, throat within normal limits Cardiovascular: Normal rate, regular rhythm and normal heart sounds.  No murmur heard. No BLE  edema. Pulmonary/Chest: Effort normal and breath sounds normal. No respiratory distress. Abdominal: Soft.  There is epigastric  tenderness. Psychiatric: Patient has a normal mood and affect. behavior is normal. Judgment and thought content normal. Muscular Skeletal: pain with extension of both knees, left calf larger than right, tight to touch and very tender.   Recent Results (from the past 2160 hour(s))  Urinalysis, Routine w reflex microscopic     Status: Abnormal   Collection Time: 06/11/18  1:08 PM  Result Value Ref Range   Color, Urine AMBER (A)  YELLOW    Comment: BIOCHEMICALS MAY BE AFFECTED BY COLOR   APPearance CLOUDY (A) CLEAR   Specific Gravity, Urine 1.022 1.005 - 1.030   pH 5.0 5.0 - 8.0   Glucose, UA >=500 (A) NEGATIVE mg/dL   Hgb urine dipstick LARGE (A) NEGATIVE   Bilirubin Urine NEGATIVE NEGATIVE   Ketones, ur NEGATIVE NEGATIVE mg/dL   Protein, ur 30 (A) NEGATIVE mg/dL   Nitrite NEGATIVE NEGATIVE   Leukocytes, UA LARGE (A) NEGATIVE   RBC / HPF >50 (H) 0 - 5 RBC/hpf   WBC, UA >50 (H) 0 - 5 WBC/hpf   Bacteria, UA NONE SEEN NONE SEEN   Squamous Epithelial / LPF 11-20 0 - 5   WBC Clumps PRESENT    Mucus PRESENT    Budding Yeast PRESENT     Comment: Performed at [facility], [address] [place], [place] [address]  CBC     Status: None   Collection Time: 06/11/18  1:08 PM  Result Value Ref Range   WBC 7.0 3.6 - 11.0 K/uL   RBC 4.63 3.80 - 5.20 MIL/uL   Hemoglobin 14.4 12.0 - 16.0 g/dL   HCT 41.0 35.0 - 47.0 %   MCV 88.5 80.0 - 100.0 fL   MCH 31.0 26.0 - 34.0 pg   MCHC 35.0 32.0 - 36.0 g/dL   RDW 14.0 11.5 - 14.5 %   Platelets 151 150 - 440 K/uL    Comment: Performed at [facility], [address] [place], [place] [address]  Basic metabolic panel     Status: Abnormal   Collection Time: 06/11/18  1:08 PM  Result Value Ref Range   Sodium 139 135 - 145 mmol/L   Potassium 4.3 3.5 - 5.1 mmol/L   Chloride 105 98 - 111 mmol/L   CO2 29 22  - 32 mmol/L   Glucose, Bld 260 (H) 70 - 99 mg/dL   BUN 13 8 - 23 mg/dL   Creatinine, Ser 0.77 0.44 - 1.00 mg/dL   Calcium 8.6 (L) 8.9 - 10.3 mg/dL   GFR calc non Af Amer >60 >60 mL/min   GFR calc Af Amer >60 >60 mL/min    Comment: (NOTE) The eGFR has been calculated using the CKD EPI equation. This calculation has not been validated in all clinical situations. eGFR's persistently <60 mL/min signify possible Chronic Kidney Disease.    Anion gap 5 5 - 15    Comment: Performed at [facility], [address] [place], [place] [address]  CBC with Differential/Platelet     Status: None   Collection Time: 07/07/18  9:55 AM  Result Value Ref Range   WBC 4.8 3.8 - 10.8 Thousand/uL   RBC 4.79 3.80 - 5.10 Million/uL   Hemoglobin 14.5 11.7 - 15.5 g/dL   HCT 42.8 35.0 - 45.0 %   MCV 89.4 80.0 - 100.0 fL   MCH 30.3 27.0 - 33.0 pg   MCHC 33.9 32.0 - 36.0 g/dL   RDW 13.2 11.0 - 15.0 %   Platelets 177 140 - 400 Thousand/uL   MPV 10.4 7.5 - 12.5 fL   Neutro Abs 3,187 1,500 - 7,800 cells/uL   Lymphs Abs 1,104 850 - 3,900 cells/uL   WBC mixed population 370 200 - 950 cells/uL   Eosinophils Absolute 110 15 - 500 cells/uL   Basophils Absolute 29 0 - 200 cells/uL   Neutrophils Relative % 66.4 %   Total Lymphocyte 23.0 %   Monocytes Relative 7.7 %  Eosinophils Relative 2.3 %   Basophils Relative 0.6 %  COMPLETE METABOLIC PANEL WITH GFR     Status: Abnormal   Collection Time: 07/07/18  9:55 AM  Result Value Ref Range   Glucose, Bld 324 (H) 65 - 99 mg/dL    Comment: .            Fasting reference interval . For someone without known diabetes, a glucose value >125 mg/dL indicates that they may have diabetes and this should be confirmed with a follow-up test. .    BUN 17 7 - 25 mg/dL   Creat 0.92 0.60 - 0.93 mg/dL    Comment: For patients >45 years of age, the reference limit for Creatinine is approximately 13% higher for people identified as African-American. .    GFR, Est  Non African American 63 > OR = 60 mL/min/1.4m   GFR, Est African American 73 > OR = 60 mL/min/1.788m  BUN/Creatinine Ratio NOT APPLICABLE 6 - 22 (calc)   Sodium 139 135 - 146 mmol/L   Potassium 4.1 3.5 - 5.3 mmol/L   Chloride 107 98 - 110 mmol/L   CO2 28 20 - 32 mmol/L   Calcium 9.2 8.6 - 10.4 mg/dL   Total Protein 5.8 (L) 6.1 - 8.1 g/dL   Albumin 3.9 3.6 - 5.1 g/dL   Globulin 1.9 1.9 - 3.7 g/dL (calc)   AG Ratio 2.1 1.0 - 2.5 (calc)   Total Bilirubin 0.6 0.2 - 1.2 mg/dL   Alkaline phosphatase (APISO) 101 33 - 130 U/L   AST 8 (L) 10 - 35 U/L   ALT 7 6 - 29 U/L  Lipid panel     Status: Abnormal   Collection Time: 07/07/18  9:55 AM  Result Value Ref Range   Cholesterol 119 <200 mg/dL   HDL 25 (L) >50 mg/dL   Triglycerides 153 (H) <150 mg/dL   LDL Cholesterol (Calc) 70 mg/dL (calc)    Comment: Reference range: <100 . Desirable range <100 mg/dL for primary prevention;   <70 mg/dL for patients with CHD or diabetic patients  with > or = 2 CHD risk factors. . Marland KitchenDL-C is now calculated using the Martin-Hopkins  calculation, which is a validated novel method providing  better accuracy than the Friedewald equation in the  estimation of LDL-C.  MaCresenciano Genret al. JAAnnamaria Helling202094;709(62 [id number]  (http://education.[facility].com/faq/FAQ164)    Total CHOL/HDL Ratio 4.8 <5.0 (calc)   Non-HDL Cholesterol (Calc) 94 <130 mg/dL (calc)    Comment: For patients with diabetes plus 1 major ASCVD risk  factor, treating to a non-HDL-C goal of <100 mg/dL  (LDL-C of <70 mg/dL) is considered a therapeutic  option.   Hemoglobin A1c     Status: Abnormal   Collection Time: 07/07/18  9:55 AM  Result Value Ref Range   Hgb A1c MFr Bld 9.0 (H) <5.7 % of total Hgb    Comment: For someone without known diabetes, a hemoglobin A1c value of 6.5% or greater indicates that they may have  diabetes and this should be confirmed with a follow-up  test. . For someone with known diabetes, a value <7%  indicates  that their diabetes is well controlled and a value  greater than or equal to 7% indicates suboptimal  control. A1c targets should be individualized based on  duration of diabetes, age, comorbid conditions, and  other considerations. . Currently, no consensus exists regarding use of hemoglobin A1c for diagnosis of diabetes for children. .    Mean  Plasma Glucose 212 (calc)   eAG (mmol/L) 11.7 (calc)  Hepatitis C Antibody     Status: None   Collection Time: 07/07/18  9:55 AM  Result Value Ref Range   Hepatitis C Ab NON-REACTIVE NON-REACTI   SIGNAL TO CUT-OFF 0.01 <1.00    Comment: . HCV antibody was non-reactive. There is no laboratory  evidence of HCV infection. . In most cases, no further action is required. However, if recent HCV exposure is suspected, a test for HCV RNA (test code 808 [id number] 2599) is suggested. . For additional information please refer to http://education.[facility].com/faq/FAQ22v1 (This link is being provided for informational/ educational purposes only.) .   Microalbumin / creatinine urine ratio     Status: None   Collection Time: 07/07/18  9:55 AM  Result Value Ref Range   Creatinine, Urine 94 20 - 275 mg/dL   Microalb, Ur 0.7 mg/dL    Comment: Reference Range Not established    Microalb Creat Ratio 7 <30 mcg/mg creat    Comment: . The ADA defines abnormalities in albumin excretion as follows: Marland Kitchen Category         Result (mcg/mg creatinine) . Normal                    <30 Microalbuminuria         30-299  Clinical albuminuria   > OR = 300 . The ADA recommends that at least two of three specimens collected within a 3-6 month period be abnormal before considering a patient to be within a diagnostic category.   Magnesium     Status: None   Collection Time: 07/07/18  9:55 AM  Result Value Ref Range   Magnesium 1.7 1.5 - 2.5 mg/dL   PHQ2/9: Depression screen [facility] 2/9 08/14/2018 07/07/2018  Decreased Interest 0 0  Down, Depressed,  Hopeless 0 0  PHQ - 2 Score 0 0  Altered sleeping - 3  Tired, decreased energy - 3  Change in appetite - 0  Feeling bad or failure about yourself  - 0  Trouble concentrating - 0  Moving slowly or fidgety/restless - 0  Suicidal thoughts - 0  PHQ-9 Score - 6  Difficult doing work/chores - Not difficult at all     Fall Risk: Fall Risk  08/14/2018 07/07/2018  Falls in the past year? Yes Yes  Number falls in past yr: 2 or more 2 or more  Injury with Fall? Yes Yes  Risk Factor Category  High Fall Risk High Fall Risk  Risk for fall due to : History of fall(s) Impaired balance/gait;Impaired mobility  Follow up Falls evaluation completed Falls prevention discussed;Education provided;Falls evaluation completed     Functional Status Survey: Is the patient deaf or have difficulty hearing?: No Does the patient have difficulty seeing, even when wearing glasses/contacts?: No Does the patient have difficulty concentrating, remembering, or making decisions?: No Does the patient have difficulty walking or climbing stairs?: Yes Does the patient have difficulty dressing or bathing?: No Does the patient have difficulty doing errands alone such as visiting a doctor's office or shopping?: No    Assessment & Plan  1. Dyslipidemia associated with type 2 diabetes mellitus ([place])  Discussed medications, but she refuses injectables, we will refer her to chronic care management, I also discussed referral to dietician but she refuses   2. SI (sacroiliac) joint inflammation ([facility])  Currently not having pain or problems  3. Leg cramping  - rOPINIRole (REQUIP) 1 MG tablet; Take 1 tablet (1 mg  total) by mouth at bedtime.  Dispense: 90 tablet; Refill: 0 - Referral to Chronic Care Management Services  4. Senile purpura ([facility])  stable  5. Primary osteoarthritis of both knees  - Referral to Chronic Care Management Services  6. Diabetes mellitus type 2 in obese ([facility])  - metFORMIN (GLUCOPHAGE-XR) 750  MG 24 hr tablet; Take 2 tablets (1,500 mg total) by mouth daily with breakfast.  Dispense: 180 tablet; Refill: 0 - glipiZIDE (GLUCOTROL XL) 5 MG 24 hr tablet; Take 1 tablet (5 mg total) by mouth daily with breakfast.  Dispense: 90 tablet; Refill: 0 - Referral to Chronic Care Management Services   7. Epigastric pain  She never got rx because of lack of transportation  - omeprazole (PRILOSEC) 40 MG capsule; Take 1 capsule (40 mg total) by mouth daily.  Dispense: 90 capsule; Refill: 0  8. Weight loss  Stable now, gained 2 lbs since last visit    9. Pain of left calf  - VAS [place] LOWER EXTREMITY VENOUS (DVT); Future

## 2018-08-14 NOTE — Telephone Encounter (Signed)
Refill request was sent to Dr. Krichna Sowles for approval and submission.  

## 2018-08-14 NOTE — Addendum Note (Signed)
Addended by: Inda Coke on: 08/14/2018 10:09 AM   Modules accepted: Orders

## 2018-08-14 NOTE — Patient Instructions (Addendum)
1. Attend your ultrasound/biopsy appointment as scheduled.  2. Call the office with any questions for your case management team 3. Your nurse case manager will call you in a week to follow up with you.    CCM (Chronic Care Management) Team   [place] Nurse Care Coordinator  [phone]   Ruben Reason PharmD  Clinical Pharmacist  [phone]

## 2018-08-14 NOTE — Addendum Note (Signed)
Addended by: Inda Coke on: 08/14/2018 11:49 AM   Modules accepted: Orders

## 2018-08-14 NOTE — Chronic Care Management (AMB) (Signed)
  Chronic Care Management   Initial Visit Note  08/14/2018 Name: Natasha [name] MRN: [id number] DOB: [birth year]  Referred by: Natasha Sizer, MD Reason for referral : Chronic Care Management (Initial Visit)  Subjective: "I want my legs to stop hurting"  Objective:  Lab Results  Component Value Date   HGBA1C 9.0 (H) 07/07/2018   Lab Results  Component Value Date   MICROALBUR 0.7 07/07/2018   LDLCALC 70 07/07/2018   CREATININE 0.92 07/07/2018   BP Readings from Last 3 Encounters:  08/14/18 116/78  07/24/18 127/82  07/07/18 116/64   Advanced Directives 06/11/2018  Does Patient Have a Medical Advance Directive? No  Would patient like information on creating a medical advance directive? No - Patient declined    Assessment: [name] is a 72 year old female primary care patient of Dr. Steele [name] who only recently moved to the area from [place]. She has recently established care with Dr. Ancil [name] who referred her to the CCM program for assistance with care coordination and disease management related to DM and new diagnosis left groin pain and lymph node swelling/progressively worsening leg pain.   [name] is being seen in the office today by her PCP and met with the CCM team as well.   Goals Addressed    . "I don't want my diabetes to get worse" (pt-stated)       Patient has poor understanding of DM disease process; limited to no knowledge of carb modified diet; patient is not checking cbg's and does not currently have a monitor; HgA1C elevated as noted above; patient had wound of left groin in 2018 which was difficult to heal  Clinical Goal(s): Over the next 30 days, patient will verbalize basic understanding of DM diagnosis and importance of self management activities  Interventions: General DM education provided; collaboration with PCP team to order cbg meter     . "I need the pain in my legs to go away" (pt-stated)       Discussion with Dr. Ancil [name] re: patient's  report of persistent leg pain, evaluation via ultrasound, and recommendation for lymph node bx.   Clinical Goal(s): Over the next 7 days, patient will verbalize receipt of appointment confirmation for biopsy and will confirm needed transportation.   Interventions: Discussed with patient Dr. Ancil [name] recommendations; addressed patient questions about procedure and transportation barriers.     Plan: I will follow up with patient by phone in one week. Patient will attend scheduled provider/diagnostic appointments.   [name] was given information about Chronic Care Management services today including:  1. CCM service includes personalized support from designated clinical staff supervised by her physician, including individualized plan of care and coordination with other care providers 2. 24/7 contact phone numbers for assistance for urgent and routine care needs. 3. Service will only be billed when office clinical staff spend 20 minutes or more in a month to coordinate care. 4. Only one practitioner may furnish and bill the service in a calendar month. 5. The patient may stop CCM services at any time (effective at the end of the month) by phone call to the office staff. 6. The patient will be responsible for cost sharing (co-pay) of up to 20% of the service fee (after annual deductible is met).  Patient agreed to services and verbal consent obtained.  [facility] [place] Management  [phone]

## 2018-08-15 ENCOUNTER — Telehealth: Payer: Self-pay | Admitting: Family Medicine

## 2018-08-15 MED ORDER — GLUCOSE BLOOD VI STRP: ORAL_STRIP | 12 refills | Status: DC

## 2018-08-15 MED ORDER — ACCU-CHEK AVIVA PLUS W/DEVICE KIT: 1.0000 | PACK | Freq: Two times a day (BID) | 0 refills | Status: DC

## 2018-08-15 NOTE — Telephone Encounter (Signed)
Copied from [place] [id number]. Topic: Quick Communication - See Telephone Encounter >> Aug 15, 2018  8:02 AM Conception Chancy, NT wrote: CRM for notification. See Telephone encounter for: 08/15/18.  Butch Penny is calling from [employer] and states that they are needing to know exactly how many times the patient is supposed to check there blood a day. They can not go off of "as instructed". Also states the patients insurance does not cover [place] and they would like approval to change it to [place] which the insurance does cover.  MEDICAL [address] [place], [place] - [place] [address] Phone: [phone] Fax: [phone]

## 2018-08-15 NOTE — Telephone Encounter (Signed)
Before breakfast and 2 hours after dinner

## 2018-08-16 ENCOUNTER — Ambulatory Visit (INDEPENDENT_AMBULATORY_CARE_PROVIDER_SITE_OTHER): Payer: Medicare HMO | Admitting: Gastroenterology

## 2018-08-16 ENCOUNTER — Encounter

## 2018-08-16 ENCOUNTER — Other Ambulatory Visit (INDEPENDENT_AMBULATORY_CARE_PROVIDER_SITE_OTHER): Payer: Medicare HMO

## 2018-08-16 ENCOUNTER — Encounter: Payer: Self-pay | Admitting: Gastroenterology

## 2018-08-16 VITALS — BP 120/72 | HR 86 | Ht 63.0 in | Wt 177.6 lb

## 2018-08-16 DIAGNOSIS — R1033 Periumbilical pain: Secondary | ICD-10-CM

## 2018-08-16 DIAGNOSIS — R634 Abnormal weight loss: Secondary | ICD-10-CM | POA: Diagnosis not present

## 2018-08-16 DIAGNOSIS — R6889 Other general symptoms and signs: Secondary | ICD-10-CM | POA: Diagnosis not present

## 2018-08-16 LAB — BUN: BUN: 16 mg/dL (ref 6–23)

## 2018-08-16 LAB — CREATININE, SERUM: Creatinine, Ser: 0.86 mg/dL (ref 0.40–1.20)

## 2018-08-16 NOTE — Patient Instructions (Signed)
If you are age 70 or older, your body mass index should be between 23-30. Your Body mass index is 31.46 kg/m. If this is out of the aforementioned range listed, please consider follow up with your Primary Care Provider.  If you are age 38 or younger, your body mass index should be between 19-25. Your Body mass index is 31.46 kg/m. If this is out of the aformentioned range listed, please consider follow up with your Primary Care Provider.   You have been scheduled for a CT scan of the abdomen and pelvis at [facility] ([address]---this is in the same building as Press [facility]).   You are scheduled on 08-29-2018 at 2pm. You should arrive 15 minutes prior to your appointment time for registration. Please follow the written instructions below on the day of your exam:  WARNING: IF YOU ARE ALLERGIC TO IODINE/X-RAY DYE, PLEASE NOTIFY RADIOLOGY IMMEDIATELY AT [phone]! YOU WILL BE GIVEN A 13 HOUR PREMEDICATION PREP.  1) Do not eat or drink anything after 10am (4 hours prior to your test) 2) You have been given 2 bottles of oral contrast to drink. The solution may taste better if refrigerated, but do NOT add ice or any other liquid to this solution. Shake well before drinking.    Drink 1 bottle of contrast @ 12/noon (2 hours prior to your exam)  Drink 1 bottle of contrast @ 1pm (1 hour prior to your exam)  You may take any medications as prescribed with a small amount of water, if necessary. If you take any of the following medications: METFORMIN, GLUCOPHAGE, GLUCOVANCE, AVANDAMET, RIOMET, FORTAMET, [place] MET, JANUMET, GLUMETZA or METAGLIP, you MAY be asked to HOLD this medication 48 hours AFTER the exam.  The purpose of you drinking the oral contrast is to aid in the visualization of your intestinal tract. The contrast solution may cause some diarrhea. Depending on your individual set of symptoms, you may also receive an intravenous injection of x-ray contrast/dye. Plan on  being at [facility] for 30 minutes or longer, depending on the type of exam you are having performed.  This test typically takes 30-45 minutes to complete.  If you have any questions regarding your exam or if you need to reschedule, you may call the CT department at [phone] between the hours of 8:00 am and 5:00 pm, Monday-Friday.  ________________________________________________________________________  It was a pleasure to see you today!  Dr. Loletha Carrow

## 2018-08-16 NOTE — Progress Notes (Signed)
[facility] Consult Note:  History: Natasha [name] 08/16/2018  Referring physician: Steele Sizer, MD  Reason for consult/chief complaint: Abdominal Pain (periumbilical pain after meals x 1 month)   Subjective  HPI:  This is a 72 year old woman referred after a family medicine clinic visit 2 days ago for abdominal pain.  I reviewed that note, it sounds like they have had difficulty getting a clear history of the patient's medical issues since she moved here from [place].  Patient has had poorly controlled diabetes but is not willing to take injectable medicines.  She expresses frustration to me today that no one has been paying attention to her chronic leg pain. Natasha [name] is a limited historian with poor health literacy who describes about a month of periumbilic somewhat bandlike abdominal pain that is vague with no clear consistent triggers or relieving factors.  It sounds like it might be worse to meals, more so if she eats a large portion.  She had lost some weight over the last few months, but it is stabilized currently.  She typically has a BM about every other day, and this is been her pattern for many years.  Denies rectal bleeding, dysphagia, odynophagia, nausea, vomiting.  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  She does not even recall the circumstances that led to the diagnosis.  She recalls being offered chemotherapy, but said she would need it "for life".  She did not want that therapy, and then says her condition was reevaluated and found to be cured after she had been to a revival.  He has not noticed any enlarged lymph nodes or skin rash.  She complains multiple times about chronic pain in her lower legs.  ROS:  Review of Systems  Constitutional: Positive for fatigue and unexpected weight change. Negative for appetite change.  HENT: Negative for mouth sores and voice change.   Eyes: Negative for pain  and redness.  Respiratory: Negative for cough and shortness of breath.   Cardiovascular: Positive for leg swelling. Negative for chest pain and palpitations.  Genitourinary: Negative for dysuria and hematuria.  Musculoskeletal: Positive for arthralgias and myalgias.  Skin: Negative for pallor and rash.  Neurological: Negative for weakness and headaches.       Bilateral leg pain  Hematological: Negative for adenopathy.     Past Medical History: Past Medical History:  Diagnosis Date  . Cancer ([place])    lymphoma  . Diabetes mellitus without complication ([place])   See above for questions related to the lymphoma diagnosis   Past Surgical History: Past Surgical History:  Procedure Laterality Date  . TONSILLECTOMY Bilateral    as a child     Family History: Family History  Problem Relation Age of Onset  . Cancer Mother        breast  . Cancer Father        lung  . Alzheimer's disease Father   . Varicose Veins Brother     Social History: Social History   Socioeconomic History  . Marital status: Widowed    Spouse name: Not on file  . Number of children: 2  . Years of education: Not on file  . Highest education level: 9th grade  Occupational History  . Occupation: unemployed  Social Needs  . Financial resource strain: Very hard  . Food insecurity:    Worry: Often true    Inability: Often true  . Transportation needs:    Medical: No  Non-medical: No  Tobacco Use  . Smoking status: Current Every Day Smoker    Packs/day: 0.50    Types: Cigarettes    Start date: 07/07/1961  . Smokeless tobacco: Never Used  Substance and Sexual Activity  . Alcohol use: No  . Drug use: Never  . Sexual activity: Not Currently    Partners: Male  Lifestyle  . Physical activity:    Days per week: 7 days    Minutes per session: 10 min  . Stress: Not at all  Relationships  . Social connections:    Talks on phone: Never    Gets together: More than three times a week    Attends  religious service: More than 4 times per year    Active member of club or organization: No    Attends meetings of clubs or organizations: Never    Relationship status: Widowed  Other Topics Concern  . Not on file  Social History Narrative   Patient just moved here from [place] and lives with her daughter.      She has 1 son, 1 daughter, 8 grandchildren, & 2 great-grandchildren.    Allergies: No Known Allergies  Outpatient Meds: Current Outpatient Medications  Medication Sig Dispense Refill  . Blood Glucose Monitoring Suppl (ACCU-CHEK AVIVA PLUS) w/Device KIT Place 1 each into alternate nostrils 2 (two) times daily. (Patient not taking: Reported on 08/16/2018) 1 kit 0  . glipiZIDE (GLUCOTROL XL) 5 MG 24 hr tablet Take 1 tablet (5 mg total) by mouth daily with breakfast. (Patient not taking: Reported on 08/16/2018) 90 tablet 0  . glucose blood (ACCU-CHEK AVIVA PLUS) test strip Use as instructed (Patient not taking: Reported on 08/16/2018) 100 each 12  . Lancets (FREESTYLE) lancets Use as instructed (Patient not taking: Reported on 08/16/2018) 100 each 12  . metFORMIN (GLUCOPHAGE-XR) 750 MG 24 hr tablet Take 2 tablets (1,500 mg total) by mouth daily with breakfast. (Patient not taking: Reported on 08/16/2018) 180 tablet 0  . omeprazole (PRILOSEC) 40 MG capsule Take 1 capsule (40 mg total) by mouth daily. (Patient not taking: Reported on 08/16/2018) 90 capsule 0  . rOPINIRole (REQUIP) 1 MG tablet Take 1 tablet (1 mg total) by mouth at bedtime. (Patient not taking: Reported on 08/16/2018) 90 tablet 0   No current facility-administered medications for this visit.       ___________________________________________________________________ Objective   Exam:  BP 120/72   Pulse 86   Ht '5\' 3"'  (1.6 m)   Wt 177 lb 9.6 oz (80.6 kg)   SpO2 94%   BMI 31.46 kg/m  Wt 200# 06/11/18, 178 # 07/07/18 Entire exam chaperoned by medical assistant, Magdalene River  General: this is a(n) chronically  ill-appearing woman, blunted and odd affect  Eyes: sclera anicteric, no redness  ENT: oral mucosa moist without lesions, no cervical or supraclavicular lymphadenopathy, poor dentition but no axillary adenopathy  CV: RRR without murmur, S1/S2, no JVD, mild peripheral edema, lower legs tender to the touch  Resp: clear to auscultation bilaterally, normal RR and effort noted  GI: soft, upper bandlike tenderness, with active bowel sounds. No guarding or palpable organomegaly noted.  Skin; warm and dry, no rash or jaundice noted  Neuro: awake, alert and oriented x 3. Normal gross motor function and fluent speech  Labs:  CBC Latest Ref Rng & Units 07/07/2018 06/11/2018  WBC 3.8 - 10.8 Thousand/uL 4.8 7.0  Hemoglobin 11.7 - 15.5 g/dL 14.5 14.4  Hematocrit 35.0 - 45.0 % 42.8 41.0  Platelets  140 - 400 Thousand/uL 177 151   CMP Latest Ref Rng & Units 08/16/2018 07/07/2018 06/11/2018  Glucose 65 - 99 mg/dL - 324(H) 260(H)  BUN 6 - 23 mg/dL '16 17 13  ' Creatinine 0.40 - 1.20 mg/dL 0.86 0.92 0.77  Sodium 135 - 146 mmol/L - 139 139  Potassium 3.5 - 5.3 mmol/L - 4.1 4.3  Chloride 98 - 110 mmol/L - 107 105  CO2 20 - 32 mmol/L - 28 29  Calcium 8.6 - 10.4 mg/dL - 9.2 8.6(L)  Total Protein 6.1 - 8.1 g/dL - 5.8(L) -  Total Bilirubin 0.2 - 1.2 mg/dL - 0.6 -  AST 10 - 35 U/L - 8(L) -  ALT 6 - 29 U/L - 7 -   Hgb A1C 9.0 on 07/07/18 (PCP note says refused injectable therapy)  Radiologic Studies:  No imaging  Assessment: Encounter Diagnoses  Name Primary?  . Periumbilical pain Yes  . Abnormal loss of weight     A month of abdominal pain that is very difficult to characterize.  Unclear and unsubstantiated history of lymphoma.  She might need endoscopic work-up, but when I started making mention of that, she seemed disinclined to do so, stating "I don't know if I want anything stuck in me".  She was agreeable to a CT scan abdomen and pelvis.  She also asked if I had any suggestions about her chronic  leg pain, and I referred her back to primary care about this.  Plan:  CT abdomen and pelvis with oral and IV contrast.  Recent renal function normal, will recheck today.  Thank you for the courtesy of this consult.  Please call me with any questions or concerns.  Nelida Meuse III  CC: Steele Sizer, MD

## 2018-08-21 ENCOUNTER — Other Ambulatory Visit: Payer: Self-pay | Admitting: Radiology

## 2018-08-21 ENCOUNTER — Encounter: Payer: Self-pay | Admitting: Family Medicine

## 2018-08-22 ENCOUNTER — Ambulatory Visit: Payer: Self-pay

## 2018-08-22 ENCOUNTER — Other Ambulatory Visit: Payer: Self-pay

## 2018-08-22 ENCOUNTER — Telehealth: Payer: Self-pay

## 2018-08-22 ENCOUNTER — Ambulatory Visit
Admission: RE | Admit: 2018-08-22 | Discharge: 2018-08-22 | Disposition: A | Discharge status: Home / Self Care | Payer: Medicare HMO | Source: Ambulatory Visit | Attending: Family Medicine | Admitting: Family Medicine

## 2018-08-22 DIAGNOSIS — Z79899 Other long term (current) drug therapy: Secondary | ICD-10-CM | POA: Diagnosis not present

## 2018-08-22 DIAGNOSIS — R252 Cramp and spasm: Secondary | ICD-10-CM

## 2018-08-22 DIAGNOSIS — Z8572 Personal history of non-Hodgkin lymphomas: Secondary | ICD-10-CM | POA: Insufficient documentation

## 2018-08-22 DIAGNOSIS — Z9889 Other specified postprocedural states: Secondary | ICD-10-CM | POA: Diagnosis not present

## 2018-08-22 DIAGNOSIS — E669 Obesity, unspecified: Secondary | ICD-10-CM

## 2018-08-22 DIAGNOSIS — Z7984 Long term (current) use of oral hypoglycemic drugs: Secondary | ICD-10-CM | POA: Insufficient documentation

## 2018-08-22 DIAGNOSIS — F17210 Nicotine dependence, cigarettes, uncomplicated: Secondary | ICD-10-CM | POA: Insufficient documentation

## 2018-08-22 DIAGNOSIS — E1169 Type 2 diabetes mellitus with other specified complication: Secondary | ICD-10-CM

## 2018-08-22 DIAGNOSIS — R599 Enlarged lymph nodes, unspecified: Secondary | ICD-10-CM | POA: Diagnosis not present

## 2018-08-22 DIAGNOSIS — R590 Localized enlarged lymph nodes: Secondary | ICD-10-CM | POA: Insufficient documentation

## 2018-08-22 DIAGNOSIS — R1013 Epigastric pain: Secondary | ICD-10-CM

## 2018-08-22 DIAGNOSIS — E119 Type 2 diabetes mellitus without complications: Secondary | ICD-10-CM | POA: Diagnosis not present

## 2018-08-22 DIAGNOSIS — R6889 Other general symptoms and signs: Secondary | ICD-10-CM | POA: Diagnosis not present

## 2018-08-22 LAB — CBC
HCT: 42.8 % (ref 36.0–46.0)
Hemoglobin: 14.4 g/dL (ref 12.0–15.0)
MCH: 29.7 pg (ref 26.0–34.0)
MCHC: 33.6 g/dL (ref 30.0–36.0)
MCV: 88.2 fL (ref 80.0–100.0)
Platelets: 163 10*3/uL (ref 150–400)
RBC: 4.85 MIL/uL (ref 3.87–5.11)
RDW: 13.2 % (ref 11.5–15.5)
WBC: 4.9 10*3/uL (ref 4.0–10.5)
nRBC: 0 % (ref 0.0–0.2)

## 2018-08-22 LAB — GLUCOSE, CAPILLARY: Glucose-Capillary: 225 mg/dL — ABNORMAL HIGH (ref 70–99)

## 2018-08-22 LAB — PROTIME-INR
INR: 1.03
Prothrombin Time: 13.4 seconds (ref 11.4–15.2)

## 2018-08-22 LAB — APTT: aPTT: 25 seconds (ref 24–36)

## 2018-08-22 MED ORDER — LIDOCAINE HCL (PF) 1 % IJ SOLN
INTRAMUSCULAR | Status: AC | PRN
  Administered 2018-08-22: 8 mL

## 2018-08-22 MED ORDER — GLUCOSE BLOOD VI STRP: ORAL_STRIP | 12 refills | Status: DC

## 2018-08-22 MED ORDER — ACCU-CHEK AVIVA PLUS W/DEVICE KIT: 1.0000 | PACK | Freq: Two times a day (BID) | 0 refills | Status: DC

## 2018-08-22 MED ORDER — GLIPIZIDE ER 5 MG PO TB24: 5.0000 mg | ORAL_TABLET | Freq: Every day | ORAL | 0 refills | Status: DC

## 2018-08-22 MED ORDER — SODIUM CHLORIDE 0.9 % IV SOLN
INTRAVENOUS | Status: DC
  Administered 2018-08-22: 10:00:00 via INTRAVENOUS

## 2018-08-22 MED ORDER — FENTANYL CITRATE (PF) 100 MCG/2ML IJ SOLN
INTRAMUSCULAR | Status: AC | PRN
  Administered 2018-08-22 (×2): 50 ug via INTRAVENOUS

## 2018-08-22 MED ORDER — MIDAZOLAM HCL 2 MG/2ML IJ SOLN
INTRAMUSCULAR | Status: AC | PRN
  Administered 2018-08-22 (×2): 1 mg via INTRAVENOUS

## 2018-08-22 MED ORDER — FREESTYLE LANCETS MISC: 12 refills | Status: DC

## 2018-08-22 MED ORDER — FENTANYL CITRATE (PF) 100 MCG/2ML IJ SOLN
INTRAMUSCULAR | Status: AC
  Filled 2018-08-22: qty 4

## 2018-08-22 MED ORDER — MIDAZOLAM HCL 5 MG/5ML IJ SOLN
INTRAMUSCULAR | Status: AC
  Filled 2018-08-22: qty 5

## 2018-08-22 MED ORDER — OMEPRAZOLE 40 MG PO CPDR: 40.0000 mg | DELAYED_RELEASE_CAPSULE | Freq: Every day | ORAL | 0 refills | Status: DC

## 2018-08-22 MED ORDER — METFORMIN HCL ER 750 MG PO TB24: 1500.0000 mg | ORAL_TABLET | Freq: Every day | ORAL | 0 refills | Status: DC

## 2018-08-22 MED ORDER — ROPINIROLE HCL 1 MG PO TABS: 1.0000 mg | ORAL_TABLET | Freq: Every day | ORAL | 0 refills | Status: DC

## 2018-08-22 NOTE — Telephone Encounter (Signed)
Refill request was sent to Dr. Krichna Sowles for approval and submission.  

## 2018-08-22 NOTE — Procedures (Signed)
Left inguinal lymph node core biopsy 18 g times four EBL 0 Comp 0

## 2018-08-22 NOTE — Chronic Care Management (AMB) (Signed)
  Care Management Note    72 y.o. year old female referred to Chronic Care Management by PCP Dr. Steele Sizer for assistance with care coordination and disease management related to DM and new diagnosis left groin pain and lymph node swelling/progressively worsening leg pain.   CCM RN CM reached out to patient via telephone as a follow up on progression towards established goals.  Was unable to reach patient via telephone today and have left HIPAA compliant voicemail asking patient to return my call. (unsuccessful outreach #1).  Plan: Will follow-up within 3-5  business days via telephone.   Natasha Husted E. Rollene Rotunda RN, BSN [facility] Care Management Coordinator [phone]

## 2018-08-22 NOTE — H&P (Signed)
Chief Complaint: Patient was seen in consultation today for No chief complaint on file.  at the request of [facility] [place] [facility]  Referring Physician(s): Steele Sizer  Supervising Physician: Marybelle Killings  Patient Status: [facility] - Out-pt  History of Present Illness: Natasha [name] is a 72 y.o. female with a history of lymphoma who presents with an enlarged left inguinal lymph node.  Past Medical History:  Diagnosis Date  . Cancer ([place])    lymphoma  . Diabetes mellitus without complication [facility])     Past Surgical History:  Procedure Laterality Date  . TONSILLECTOMY Bilateral    as a child    Allergies: Patient has no known allergies.  Medications: Prior to Admission medications   Medication Sig Start Date End Date Taking? Authorizing Provider  Blood Glucose Monitoring Suppl (ACCU-CHEK AVIVA PLUS) w/Device KIT Place 1 each into alternate nostrils 2 (two) times daily. Patient not taking: Reported on 08/16/2018 08/15/18   Steele Sizer, MD  glipiZIDE (GLUCOTROL XL) 5 MG 24 hr tablet Take 1 tablet (5 mg total) by mouth daily with breakfast. Patient not taking: Reported on 08/16/2018 08/14/18   Steele Sizer, MD  glucose blood (ACCU-CHEK AVIVA PLUS) test strip Use as instructed Patient not taking: Reported on 08/16/2018 08/15/18   Steele Sizer, MD  Lancets (FREESTYLE) lancets Use as instructed Patient not taking: Reported on 08/16/2018 08/14/18   Steele Sizer, MD  metFORMIN (GLUCOPHAGE-XR) 750 MG 24 hr tablet Take 2 tablets (1,500 mg total) by mouth daily with breakfast. Patient not taking: Reported on 08/16/2018 08/14/18   Steele Sizer, MD  omeprazole (PRILOSEC) 40 MG capsule Take 1 capsule (40 mg total) by mouth daily. Patient not taking: Reported on 08/16/2018 08/14/18   Steele Sizer, MD  rOPINIRole (REQUIP) 1 MG tablet Take 1 tablet (1 mg total) by mouth at bedtime. Patient not taking: Reported on 08/16/2018 08/14/18   Steele Sizer, MD     Family  History  Problem Relation Age of Onset  . Cancer Mother        breast  . Cancer Father        lung  . Alzheimer's disease Father   . Varicose Veins Brother     Social History   Socioeconomic History  . Marital status: Widowed    Spouse name: Not on file  . Number of children: 2  . Years of education: Not on file  . Highest education level: 9th grade  Occupational History  . Occupation: unemployed  Social Needs  . Financial resource strain: Very hard  . Food insecurity:    Worry: Often true    Inability: Often true  . Transportation needs:    Medical: No    Non-medical: No  Tobacco Use  . Smoking status: Current Every Day Smoker    Packs/day: 0.50    Types: Cigarettes    Start date: 07/07/1961  . Smokeless tobacco: Never Used  Substance and Sexual Activity  . Alcohol use: No  . Drug use: Never  . Sexual activity: Not Currently    Partners: Male  Lifestyle  . Physical activity:    Days per week: 7 days    Minutes per session: 10 min  . Stress: Not at all  Relationships  . Social connections:    Talks on phone: Never    Gets together: More than three times a week    Attends religious service: More than 4 times per year    Active member of club or organization: No    Attends  meetings of clubs or organizations: Never    Relationship status: Widowed  Other Topics Concern  . Not on file  Social History Narrative   Patient just moved here from [place] and lives with her daughter.      She has 1 son, 1 daughter, 8 grandchildren, & 2 great-grandchildren.     Review of Systems: A 12 point ROS discussed and pertinent positives are indicated in the HPI above.  All other systems are negative.  Review of Systems  Vital Signs: There were no vitals taken for this visit.  Physical Exam  Constitutional: She is oriented to person, place, and time. She appears well-developed and well-nourished.  HENT:  Head: Normocephalic and atraumatic.  Cardiovascular: Normal rate  and regular rhythm.  Pulmonary/Chest: Effort normal and breath sounds normal.  Neurological: She is alert and oriented to person, place, and time.    Imaging: US Venous Img Lower Unilateral Left  Result Date: 08/14/2018 CLINICAL DATA:  Left lower extremity pain and edema. EXAM: LEFT LOWER EXTREMITY VENOUS DOPPLER ULTRASOUND TECHNIQUE: Gray-scale sonography with graded compression, as well as color Doppler and duplex ultrasound were performed to evaluate the lower extremity deep venous systems from the level of the common femoral vein and including the common femoral, femoral, profunda femoral, popliteal and calf veins including the posterior tibial, peroneal and gastrocnemius veins when visible. The superficial great saphenous vein was also interrogated. Spectral Doppler was utilized to evaluate flow at rest and with distal augmentation maneuvers in the common femoral, femoral and popliteal veins. COMPARISON:  None. FINDINGS: Contralateral Common Femoral Vein: Respiratory phasicity is normal and symmetric with the symptomatic side. No evidence of thrombus. Normal compressibility. Common Femoral Vein: No evidence of thrombus. Normal compressibility, respiratory phasicity and response to augmentation. Saphenofemoral Junction: No evidence of thrombus. Normal compressibility and flow on color Doppler imaging. Profunda Femoral Vein: No evidence of thrombus. Normal compressibility and flow on color Doppler imaging. Femoral Vein: No evidence of thrombus. Normal compressibility, respiratory phasicity and response to augmentation. Popliteal Vein: No evidence of thrombus. Normal compressibility, respiratory phasicity and response to augmentation. Calf Veins: No evidence of thrombus. Normal compressibility and flow on color Doppler imaging. Superficial Great Saphenous Vein: No evidence of thrombus. Normal compressibility. Venous Reflux:  None. Other Findings: No evidence of superficial thrombophlebitis or abnormal  fluid collection. Nonspecific mildly prominent left inguinal lymph node measures approximately 3.8 x 0.9 x 1.9 cm. Although the short axis is not overtly enlarged, the sonographic appearance of this lymph node suggests a possible pathologic lymph node and correlation is suggested with physical exam and any prior hematologic history. IMPRESSION: 1. No evidence of left lower extremity deep venous thrombosis. 2. Mildly prominent left inguinal lymph node with sonographic appearance suggesting possible pathologic lymph node. Correlation suggested with physical exam and any prior hematologic medical history. Electronically Signed   By: Aletta Edouard M.D.   On: 08/14/2018 11:33    Labs:  CBC: Recent Labs    06/11/18 1308 07/07/18 0955  WBC 7.0 4.8  HGB 14.4 14.5  HCT 41.0 42.8  PLT 151 177    COAGS: No results for input(s): INR, APTT in the last 8760 hours.  BMP: Recent Labs    06/11/18 1308 07/07/18 0955 08/16/18 1445  NA 139 139  --   K 4.3 4.1  --   CL 105 107  --   CO2 29 28  --   GLUCOSE 260* 324*  --   BUN _0 CALCIUM 8.6*  9.2  --   CREATININE 0.77 0.92 0.86  GFRNONAA >60 63  --   GFRAA >60 73  --     LIVER FUNCTION TESTS: Recent Labs    07/07/18 0955  BILITOT 0.6  AST 8*  ALT 7  PROT 5.8*    TUMOR MARKERS: No results for input(s): AFPTM, CEA, CA199, CHROMGRNA in the last 8760 hours.  Assessment and Plan:  Left inguinal adenopathy. Biopsy to follow.  Thank you for this interesting consult.  I greatly enjoyed meeting Natasha [name] and look forward to participating in their care.  A copy of this report was sent to the requesting provider on this date.  Electronically Signed: Art A Alexande Sheerin, MD 08/22/2018, 9:14 AM   I spent a total of  40 Minutes   in face to face in clinical consultation, greater than 50% of which was counseling/coordinating care for lymph node biopsy.

## 2018-08-23 ENCOUNTER — Telehealth: Payer: Self-pay | Admitting: Family Medicine

## 2018-08-23 MED ORDER — ACCU-CHEK SOFTCLIX LANCETS MISC: 12 refills | Status: DC

## 2018-08-23 NOTE — Telephone Encounter (Signed)
Copied from [place] [id number]. Topic: Quick Communication - Rx Refill/Question >> Aug 23, 2018  2:18 PM Scherrie Gerlach wrote: Medication: soft clix  Anne at Pharmacy calling to ask if you could change the Rx you sent in for free style lancets to the soft clix.  Pt does not use free style.  [place], [place] [phone] (Phone) [phone] (Fax)

## 2018-08-24 ENCOUNTER — Encounter: Payer: Self-pay | Admitting: Family Medicine

## 2018-08-24 LAB — SURGICAL PATHOLOGY

## 2018-08-29 ENCOUNTER — Telehealth: Payer: Self-pay

## 2018-08-29 ENCOUNTER — Ambulatory Visit: Payer: Self-pay

## 2018-08-29 ENCOUNTER — Ambulatory Visit (INDEPENDENT_AMBULATORY_CARE_PROVIDER_SITE_OTHER)
Admission: RE | Admit: 2018-08-29 | Discharge: 2018-08-29 | Disposition: A | Discharge status: Home / Self Care | Payer: Medicare HMO | Source: Ambulatory Visit | Attending: Gastroenterology | Admitting: Gastroenterology

## 2018-08-29 DIAGNOSIS — R6889 Other general symptoms and signs: Secondary | ICD-10-CM | POA: Diagnosis not present

## 2018-08-29 DIAGNOSIS — R634 Abnormal weight loss: Secondary | ICD-10-CM

## 2018-08-29 DIAGNOSIS — R1033 Periumbilical pain: Secondary | ICD-10-CM | POA: Diagnosis not present

## 2018-08-29 DIAGNOSIS — R161 Splenomegaly, not elsewhere classified: Secondary | ICD-10-CM | POA: Diagnosis not present

## 2018-08-29 MED ORDER — IOPAMIDOL (ISOVUE-300) INJECTION 61%
100.0000 mL | Freq: Once | INTRAVENOUS | Status: AC | PRN
  Administered 2018-08-29: 100 mL via INTRAVENOUS

## 2018-08-29 NOTE — Chronic Care Management (AMB) (Signed)
  Care Management Note   72 y.o. year old female referred to Chronic Care Management by PCP Dr. Steele Sizer for assistance with care coordination and disease management related to DM and new diagnosis left groin pain and lymph node swelling/progressively worsening leg pain.   CCM RN CM reached out to patient via telephone as a follow up on progression towards established goals and for additional interventions if needed.  Was unable to reach patient via telephone today and have left HIPAA compliant voicemail on give number ( ([name] Office manager, friend)  asking patient to return my call. (unsuccessful outreach # 2).  Plan: Will follow-up within 3-5  business days via telephone.     [name] E. Rollene Rotunda, RN, BSN Nurse Care Coordinator [facility] Care Management  [phone]

## 2018-08-30 ENCOUNTER — Telehealth: Payer: Self-pay | Admitting: Family Medicine

## 2018-08-30 ENCOUNTER — Other Ambulatory Visit: Payer: Self-pay | Admitting: Family Medicine

## 2018-08-30 DIAGNOSIS — R591 Generalized enlarged lymph nodes: Secondary | ICD-10-CM

## 2018-08-30 DIAGNOSIS — R935 Abnormal findings on diagnostic imaging of other abdominal regions, including retroperitoneum: Secondary | ICD-10-CM

## 2018-08-30 DIAGNOSIS — R634 Abnormal weight loss: Secondary | ICD-10-CM

## 2018-08-30 NOTE — Telephone Encounter (Signed)
Did not leave a message on preferred number due to the name on the voicemail message was different than the patients. Also called the house number it was busy and mobile phone number had no name on voice message. Will try to call again tomorrow.

## 2018-08-30 NOTE — Telephone Encounter (Signed)
Spoke to GI, Dr. Loletha Carrow, he is concerned about lymphoma based on CT scan report and I will place referral to oncologist for further evaluation. He is notifying patient of results but we will also reach out to patient today.

## 2018-08-30 NOTE — Telephone Encounter (Signed)
Dr Loletha Carrow GI called to speak to Dr Ancil Boozer.

## 2018-09-01 ENCOUNTER — Encounter: Payer: Self-pay | Admitting: Gastroenterology

## 2018-09-04 ENCOUNTER — Ambulatory Visit: Payer: Self-pay

## 2018-09-04 ENCOUNTER — Ambulatory Visit: Payer: Self-pay | Admitting: *Deleted

## 2018-09-04 NOTE — Telephone Encounter (Signed)
Attempted to contact pt; left message on voicemail [phone].

## 2018-09-04 NOTE — Telephone Encounter (Signed)
Pt returned call states she has been having severe leg cramps in both legs mostly the back of her legs.. She states that she has seen Dr Ancil Boozer for this last week and was given Requip to help the cramping. She states the medication has had no effect. Pt does not know what is causing the cramps. She denies numbness and rash to  legs. She states that both legs are sore to touch. Per protocol pt to be seen today but pt states she needs 2 days notice for her ride. Appointment made per pt request on Wednesday with Dr Ancil Boozer.  Care advice read to patient. Pt verbalized understanding of all instructions. Pt instructed to go to nearest ED if symptoms worsen.  Reason for Disposition . [1] SEVERE pain (e.g., excruciating, unable to do any normal activities) AND [2] not improved after 2 hours of pain medicine  Answer Assessment - Initial Assessment Questions 1. ONSET: "When did the pain start?"      Long time 2. LOCATION: "Where is the pain located?"      Both legs mostly back of legs 3. PAIN: "How bad is the pain?"    (Scale 1-10; or mild, moderate, severe)   -  MILD (1-3): doesn't interfere with normal activities    -  MODERATE (4-7): interferes with normal activities (e.g., work or school) or awakens from sleep, limping    -  SEVERE (8-10): excruciating pain, unable to do any normal activities, unable to walk     Severe tries to walk out the cramp 4. WORK OR EXERCISE: "Has there been any recent work or exercise that involved this part of the body?"      no 5. CAUSE: "What do you think is causing the leg pain?"     Not know See Dr Ancil Boozer 6. OTHER SYMPTOMS: "Do you have any other symptoms?" (e.g., chest pain, back pain, breathing difficulty, swelling, rash, fever, numbness, weakness)     none 7. PREGNANCY: "Is there any chance you are pregnant?" "When was your last menstrual period?"     No  Protocols used: LEG PAIN-A-AH

## 2018-09-05 ENCOUNTER — Telehealth: Payer: Self-pay

## 2018-09-05 ENCOUNTER — Ambulatory Visit: Payer: Self-pay

## 2018-09-05 DIAGNOSIS — E119 Type 2 diabetes mellitus without complications: Secondary | ICD-10-CM

## 2018-09-05 DIAGNOSIS — R591 Generalized enlarged lymph nodes: Secondary | ICD-10-CM

## 2018-09-05 DIAGNOSIS — R252 Cramp and spasm: Secondary | ICD-10-CM

## 2018-09-05 DIAGNOSIS — M79604 Pain in right leg: Secondary | ICD-10-CM

## 2018-09-05 DIAGNOSIS — M79605 Pain in left leg: Secondary | ICD-10-CM

## 2018-09-05 NOTE — Telephone Encounter (Addendum)
She is going to be seen at cancer center, the pain may be secondary to lymphoma and they will be able to treat her with pain medication, in the mean time I can send gabapentin

## 2018-09-05 NOTE — Chronic Care Management (AMB) (Signed)
  Chronic Care Management Note   72 y.o.year old femalereferred to Chronic Care Management by PCP Dr. Laure Kidney assistance with care coordination and disease management related to DM and new diagnosis left groin pain and lymph node swelling/progressively worsening leg pain.  Was unable to reach patient via telephone today for follow up after initial engagement in office on 08/14/18. I have left HIPAA compliant voicemail on mobil number patient was recently contact at by PCP asking patient to return my call. (unsuccessful outreach #[phone]). CCM Team has had a difficult time contacting patient as previous numbers listed in chart do not have VM set up or are not patients current numbers.  Plan: Will follow-up within 3-5  business days via telephone for final engagement/follow up attempt. Will discuss inability to contact patient with Primary Care Provider.    Travus Oren E. Rollene Rotunda, RN, BSN Nurse Care Coordinator [facility] Care Management  [phone]

## 2018-09-06 ENCOUNTER — Ambulatory Visit (INDEPENDENT_AMBULATORY_CARE_PROVIDER_SITE_OTHER): Payer: Medicare HMO | Admitting: Family Medicine

## 2018-09-06 ENCOUNTER — Telehealth: Payer: Self-pay | Admitting: Gastroenterology

## 2018-09-06 ENCOUNTER — Encounter: Payer: Self-pay | Admitting: Family Medicine

## 2018-09-06 VITALS — BP 128/86 | HR 93 | Temp 98.1°F | Resp 18 | Ht 63.0 in | Wt 182.6 lb

## 2018-09-06 DIAGNOSIS — R252 Cramp and spasm: Secondary | ICD-10-CM | POA: Diagnosis not present

## 2018-09-06 DIAGNOSIS — R6889 Other general symptoms and signs: Secondary | ICD-10-CM | POA: Diagnosis not present

## 2018-09-06 DIAGNOSIS — S32000S Wedge compression fracture of unspecified lumbar vertebra, sequela: Secondary | ICD-10-CM | POA: Diagnosis not present

## 2018-09-06 DIAGNOSIS — I700 Atherosclerosis of aorta: Secondary | ICD-10-CM

## 2018-09-06 DIAGNOSIS — Z8572 Personal history of non-Hodgkin lymphomas: Secondary | ICD-10-CM | POA: Diagnosis not present

## 2018-09-06 DIAGNOSIS — R591 Generalized enlarged lymph nodes: Secondary | ICD-10-CM

## 2018-09-06 DIAGNOSIS — Z1211 Encounter for screening for malignant neoplasm of colon: Secondary | ICD-10-CM

## 2018-09-06 DIAGNOSIS — M79604 Pain in right leg: Secondary | ICD-10-CM

## 2018-09-06 DIAGNOSIS — M79605 Pain in left leg: Secondary | ICD-10-CM

## 2018-09-06 DIAGNOSIS — K8020 Calculus of gallbladder without cholecystitis without obstruction: Secondary | ICD-10-CM | POA: Diagnosis not present

## 2018-09-06 LAB — POC HEMOCCULT BLD/STL (HOME/3-CARD/SCREEN)
Card #2 Fecal Occult Blod, POC: NEGATIVE
Card #3 Fecal Occult Blood, POC: NEGATIVE
Fecal Occult Blood, POC: NEGATIVE

## 2018-09-06 MED ORDER — GABAPENTIN 100 MG PO CAPS: 100.0000 mg | ORAL_CAPSULE | Freq: Every day | ORAL | 3 refills | Status: DC

## 2018-09-06 MED ORDER — TRAMADOL HCL 50 MG PO TABS: 50.0000 mg | ORAL_TABLET | Freq: Every day | ORAL | 0 refills | Status: DC

## 2018-09-06 NOTE — Telephone Encounter (Signed)
Routed to Dr. Loletha Carrow, this is the patient you had tried to contact about her CT returning your call.

## 2018-09-06 NOTE — Progress Notes (Signed)
Name: Natasha [name]   MRN: [id number]    DOB: [birth year]   Date:09/06/2018       Progress Note  Subjective  Chief Complaint  Chief Complaint  Patient presents with  . Follow-up  . Leg Pain    HPI  Leg pain: she came in today because Requip is not helping with leg pain. She states is constant but worse at night, affects her sleep. She states it swells at times, no redness.   Compression fracture lumbar spine: on CT scan looks like chronic, she does not recall trauma, we will set up for bone density. We will try gabapentin to see if helps with leg pain. She denies back pain, bowel or bladder incontinence. She states she went to a cabin last year and slept on a sofa bed and could not walk for two days afterwards, explained that it was present in 2017 based on CT report this year  Atherosclerosis of aorta: explained vascular disease can also cause leg pain, and may need to see vascular surgeon.  Lymphadenopathy: had a biopsy that was low in cells but negative for lymphoma. 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 She is willing to see hematologist / oncologist today if it can help with her legs. 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 She is a poor historian and records are in [place]  Abdominal pain: seen by GI, had CT abdomen, she has splenomegaly, also lymphadenopathy, he advised her to be seen by oncologist for evaluation. She is on pantoprazole. CT also showed cholelithiasis but first needs evaluation by oncologist. Advised to continue PPI for now.    Patient Active Problem List   Diagnosis Date Noted  . SI (sacroiliac) joint inflammation ([place]) 08/14/2018  . Diabetes mellitus type 2, diet-controlled ([place]) 07/07/2018  . Primary osteoarthritis of both knees 07/07/2018  . Leg cramping 07/07/2018  . Senile purpura ([place]) 07/07/2018    Past Surgical History:  Procedure Laterality Date   . TONSILLECTOMY Bilateral    as a child    Family History  Problem Relation Age of Onset  . Cancer Mother        breast  . Cancer Father        lung  . Alzheimer's disease Father   . Varicose Veins Brother     Social History   Socioeconomic History  . Marital status: Widowed    Spouse name: Not on file  . Number of children: 2  . Years of education: Not on file  . Highest education level: 9th grade  Occupational History  . Occupation: unemployed  Social Needs  . Financial resource strain: Very hard  . Food insecurity:    Worry: Often true    Inability: Often true  . Transportation needs:    Medical: No    Non-medical: No  Tobacco Use  . Smoking status: Current Every Day Smoker    Packs/day: 0.50    Types: Cigarettes    Start date: 07/07/1961  . Smokeless tobacco: Never Used  Substance and Sexual Activity  . Alcohol use: No  . Drug use: Never  . Sexual activity: Not Currently    Partners: Male  Lifestyle  . Physical activity:    Days per week: 7 days    Minutes per session: 10 min  . Stress: Not at all  Relationships  . Social connections:    Talks on phone:  Never    Gets together: More than three times a week    Attends religious service: More than 4 times per year    Active member of club or organization: No    Attends meetings of clubs or organizations: Never    Relationship status: Widowed  . Intimate partner violence:    Fear of current or ex partner: No    Emotionally abused: No    Physically abused: No    Forced sexual activity: No  Other Topics Concern  . Not on file  Social History Narrative   Patient just moved here from [place] and lives with her daughter.      She has 1 son, 1 daughter, 8 grandchildren, & 2 great-grandchildren.     Current Outpatient Medications:  .  ACCU-CHEK SOFTCLIX LANCETS lancets, Use as instructed, Disp: 100 each, Rfl: 12 .  Blood Glucose Monitoring Suppl (ACCU-CHEK AVIVA PLUS) w/Device KIT, Place 1 each into  alternate nostrils 2 (two) times daily., Disp: 1 kit, Rfl: 0 .  glipiZIDE (GLUCOTROL XL) 5 MG 24 hr tablet, Take 1 tablet (5 mg total) by mouth daily with breakfast., Disp: 90 tablet, Rfl: 0 .  glucose blood (ACCU-CHEK AVIVA PLUS) test strip, Use as instructed, Disp: 100 each, Rfl: 12 .  metFORMIN (GLUCOPHAGE-XR) 750 MG 24 hr tablet, Take 2 tablets (1,500 mg total) by mouth daily with breakfast., Disp: 180 tablet, Rfl: 0 .  omeprazole (PRILOSEC) 40 MG capsule, Take 1 capsule (40 mg total) by mouth daily., Disp: 90 capsule, Rfl: 0 .  rOPINIRole (REQUIP) 1 MG tablet, Take 1 tablet (1 mg total) by mouth at bedtime., Disp: 90 tablet, Rfl: 0  No Known Allergies  I personally reviewed active problem list, medication list, allergies, family history, social history with the patient/caregiver today.   ROS  Constitutional: Negative for fever or weight change.  Respiratory: positive  for cough but no  shortness of breath.   Cardiovascular: Negative for chest pain or palpitations.  Gastrointestinal: Positive  for abdominal pain, no bowel changes.  Musculoskeletal: Positive  for gait problem and has intermittent  joint swelling.  Skin: Negative for rash.  Neurological: Negative for dizziness or headache.  No other specific complaints in a complete review of systems (except as listed in HPI above).   Objective  Vitals:   09/06/18 0800  BP: 128/86  Pulse: 93  Resp: 18  Temp: 98.1 F (36.7 C)  TempSrc: Oral  SpO2: 98%  Weight: 182 lb 9.6 oz (82.8 kg)  Height: '5\' 3"'  (1.6 m)    Body mass index is 32.35 kg/m.  Physical Exam  Constitutional: Patient appears well-developed and well-nourished. Obese No distress.  HEENT: head atraumatic, normocephalic, pupils equal and reactive to light,neck supple, throat within normal limits Cardiovascular: Normal rate, regular rhythm and normal heart sounds.  No murmur heard. No BLE edema. Pulmonary/Chest: Effort normal and breath sounds normal. No  respiratory distress. Abdominal: Soft.  There is abdominal tenderness. Psychiatric: Patient has a normal mood and affect. behavior is normal. Cooperative today   Recent Results (from the past 2160 hour(s))  Urinalysis, Routine w reflex microscopic     Status: Abnormal   Collection Time: 06/11/18  1:08 PM  Result Value Ref Range   Color, Urine AMBER (A) YELLOW    Comment: BIOCHEMICALS MAY BE AFFECTED BY COLOR   APPearance CLOUDY (A) CLEAR   Specific Gravity, Urine 1.022 1.005 - 1.030   pH 5.0 5.0 - 8.0   Glucose, UA >=500 (A)  NEGATIVE mg/dL   Hgb urine dipstick LARGE (A) NEGATIVE   Bilirubin Urine NEGATIVE NEGATIVE   Ketones, ur NEGATIVE NEGATIVE mg/dL   Protein, ur 30 (A) NEGATIVE mg/dL   Nitrite NEGATIVE NEGATIVE   Leukocytes, UA LARGE (A) NEGATIVE   RBC / HPF >50 (H) 0 - 5 RBC/hpf   WBC, UA >50 (H) 0 - 5 WBC/hpf   Bacteria, UA NONE SEEN NONE SEEN   Squamous Epithelial / LPF 11-20 0 - 5   WBC Clumps PRESENT    Mucus PRESENT    Budding Yeast PRESENT     Comment: Performed at [facility], [address] [place], [place] [address]  CBC     Status: None   Collection Time: 06/11/18  1:08 PM  Result Value Ref Range   WBC 7.0 3.6 - 11.0 K/uL   RBC 4.63 3.80 - 5.20 MIL/uL   Hemoglobin 14.4 12.0 - 16.0 g/dL   HCT 41.0 35.0 - 47.0 %   MCV 88.5 80.0 - 100.0 fL   MCH 31.0 26.0 - 34.0 pg   MCHC 35.0 32.0 - 36.0 g/dL   RDW 14.0 11.5 - 14.5 %   Platelets 151 150 - 440 K/uL    Comment: Performed at [facility], [address] [place], [place] [address]  Basic metabolic panel     Status: Abnormal   Collection Time: 06/11/18  1:08 PM  Result Value Ref Range   Sodium 139 135 - 145 mmol/L   Potassium 4.3 3.5 - 5.1 mmol/L   Chloride 105 98 - 111 mmol/L   CO2 29 22 - 32 mmol/L   Glucose, Bld 260 (H) 70 - 99 mg/dL   BUN 13 8 - 23 mg/dL   Creatinine, Ser 0.77 0.44 - 1.00 mg/dL   Calcium 8.6 (L) 8.9 - 10.3 mg/dL   GFR calc non Af Amer >60 >60 mL/min   GFR calc Af  Amer >60 >60 mL/min    Comment: (NOTE) The eGFR has been calculated using the CKD EPI equation. This calculation has not been validated in all clinical situations. eGFR's persistently <60 mL/min signify possible Chronic Kidney Disease.    Anion gap 5 5 - 15    Comment: Performed at [facility], [place],  [address]  CBC with Differential/Platelet     Status: None   Collection Time: 07/07/18  9:55 AM  Result Value Ref Range   WBC 4.8 3.8 - 10.8 Thousand/uL   RBC 4.79 3.80 - 5.10 Million/uL   Hemoglobin 14.5 11.7 - 15.5 g/dL   HCT 42.8 35.0 - 45.0 %   MCV 89.4 80.0 - 100.0 fL   MCH 30.3 27.0 - 33.0 pg   MCHC 33.9 32.0 - 36.0 g/dL   RDW 13.2 11.0 - 15.0 %   Platelets 177 140 - 400 Thousand/uL   MPV 10.4 7.5 - 12.5 fL   Neutro Abs 3,187 1,500 - 7,800 cells/uL   Lymphs Abs 1,104 850 - 3,900 cells/uL   WBC mixed population 370 200 - 950 cells/uL   Eosinophils Absolute 110 15 - 500 cells/uL   Basophils Absolute 29 0 - 200 cells/uL   Neutrophils Relative % 66.4 %   Total Lymphocyte 23.0 %   Monocytes Relative 7.7 %   Eosinophils Relative 2.3 %   Basophils Relative 0.6 %  COMPLETE METABOLIC PANEL WITH GFR     Status: Abnormal   Collection Time: 07/07/18  9:55 AM  Result Value Ref Range  Glucose, Bld 324 (H) 65 - 99 mg/dL    Comment: .            Fasting reference interval . For someone without known diabetes, a glucose value >125 mg/dL indicates that they may have diabetes and this should be confirmed with a follow-up test. .    BUN 17 7 - 25 mg/dL   Creat 0.92 0.60 - 0.93 mg/dL    Comment: For patients >33 years of age, the reference limit for Creatinine is approximately 13% higher for people identified as African-American. .    GFR, Est Non African American 63 > OR = 60 mL/min/1.75m   GFR, Est African American 73 > OR = 60 mL/min/1.751m  BUN/Creatinine Ratio NOT APPLICABLE 6 - 22 (calc)   Sodium 139 135 - 146 mmol/L   Potassium 4.1  3.5 - 5.3 mmol/L   Chloride 107 98 - 110 mmol/L   CO2 28 20 - 32 mmol/L   Calcium 9.2 8.6 - 10.4 mg/dL   Total Protein 5.8 (L) 6.1 - 8.1 g/dL   Albumin 3.9 3.6 - 5.1 g/dL   Globulin 1.9 1.9 - 3.7 g/dL (calc)   AG Ratio 2.1 1.0 - 2.5 (calc)   Total Bilirubin 0.6 0.2 - 1.2 mg/dL   Alkaline phosphatase (APISO) 101 33 - 130 U/L   AST 8 (L) 10 - 35 U/L   ALT 7 6 - 29 U/L  Lipid panel     Status: Abnormal   Collection Time: 07/07/18  9:55 AM  Result Value Ref Range   Cholesterol 119 <200 mg/dL   HDL 25 (L) >50 mg/dL   Triglycerides 153 (H) <150 mg/dL   LDL Cholesterol (Calc) 70 mg/dL (calc)    Comment: Reference range: <100 . Desirable range <100 mg/dL for primary prevention;   <70 mg/dL for patients with CHD or diabetic patients  with > or = 2 CHD risk factors. . Marland KitchenDL-C is now calculated using the Martin-Hopkins  calculation, which is a validated novel method providing  better accuracy than the Friedewald equation in the  estimation of LDL-C.  MaCresenciano Genret al. JAAnnamaria [id number];[id number]  (http://education.[facility].com/faq/FAQ164)    Total CHOL/HDL Ratio 4.8 <5.0 (calc)   Non-HDL Cholesterol (Calc) 94 <130 mg/dL (calc)    Comment: For patients with diabetes plus 1 major ASCVD risk  factor, treating to a non-HDL-C goal of <100 mg/dL  (LDL-C of <70 mg/dL) is considered a therapeutic  option.   Hemoglobin A1c     Status: Abnormal   Collection Time: 07/07/18  9:55 AM  Result Value Ref Range   Hgb A1c MFr Bld 9.0 (H) <5.7 % of total Hgb    Comment: For someone without known diabetes, a hemoglobin A1c value of 6.5% or greater indicates that they may have  diabetes and this should be confirmed with a follow-up  test. . For someone with known diabetes, a value <7% indicates  that their diabetes is well controlled and a value  greater than or equal to 7% indicates suboptimal  control. A1c targets should be individualized based on  duration of diabetes, age, comorbid  conditions, and  other considerations. . Currently, no consensus exists regarding use of hemoglobin A1c for diagnosis of diabetes for children. .    Mean Plasma Glucose 212 (calc)   eAG (mmol/L) 11.7 (calc)  Hepatitis C Antibody     Status: None   Collection Time: 07/07/18  9:55 AM  Result Value Ref Range   Hepatitis C  Ab NON-REACTIVE NON-REACTI   SIGNAL TO CUT-OFF 0.01 <1.00    Comment: . HCV antibody was non-reactive. There is no laboratory  evidence of HCV infection. . In most cases, no further action is required. However, if recent HCV exposure is suspected, a test for HCV RNA (test code [id number]) is suggested. . For additional information please refer to http://education.[facility].com/faq/FAQ22v1 (This link is being provided for informational/ educational purposes only.) .   Microalbumin / creatinine urine ratio     Status: None   Collection Time: 07/07/18  9:55 AM  Result Value Ref Range   Creatinine, Urine 94 20 - 275 mg/dL   Microalb, Ur 0.7 mg/dL    Comment: Reference Range Not established    Microalb Creat Ratio 7 <30 mcg/mg creat    Comment: . The ADA defines abnormalities in albumin excretion as follows: Marland Kitchen Category         Result (mcg/mg creatinine) . Normal                    <30 Microalbuminuria         30-299  Clinical albuminuria   > OR = 300 . The ADA recommends that at least two of three specimens collected within a 3-6 month period be abnormal before considering a patient to be within a diagnostic category.   Magnesium     Status: None   Collection Time: 07/07/18  9:55 AM  Result Value Ref Range   Magnesium 1.7 1.5 - 2.5 mg/dL  Creatinine     Status: None   Collection Time: 08/16/18  2:45 PM  Result Value Ref Range   Creatinine, Ser 0.86 0.40 - 1.20 mg/dL  BUN     Status: None   Collection Time: 08/16/18  2:45 PM  Result Value Ref Range   BUN 16 6 - 23 mg/dL  APTT upon arrival     Status: None   Collection Time: 08/22/18  9:04 AM   Result Value Ref Range   aPTT 25 24 - 36 seconds    Comment: Performed at [facility], [address] [place], [place] [address]  CBC upon arrival     Status: None   Collection Time: 08/22/18  9:04 AM  Result Value Ref Range   WBC 4.9 4.0 - 10.5 K/uL   RBC 4.85 3.87 - 5.11 MIL/uL   Hemoglobin 14.4 12.0 - 15.0 g/dL   HCT 42.8 36.0 - 46.0 %   MCV 88.2 80.0 - 100.0 fL   MCH 29.7 26.0 - 34.0 pg   MCHC 33.6 30.0 - 36.0 g/dL   RDW 13.2 11.5 - 15.5 %   Platelets 163 150 - 400 K/uL   nRBC 0.0 0.0 - 0.2 %    Comment: Performed at [facility], [address] [place], [place] [address]  Protime-INR upon arrival     Status: None   Collection Time: 08/22/18  9:04 AM  Result Value Ref Range   Prothrombin Time 13.4 11.4 - 15.2 seconds   INR 1.03     Comment: Performed at [facility], [address] [place], [place] [address]  Glucose, capillary     Status: Abnormal   Collection Time: 08/22/18  9:21 AM  Result Value Ref Range   Glucose-Capillary 225 (H) 70 - 99 mg/dL  Surgical pathology     Status: None   Collection Time: 08/22/18 11:07 AM  Result Value Ref Range   SURGICAL PATHOLOGY      Surgical  Pathology CASE: [id number] PATIENT: Aalyah Fluitt Surgical Pathology Report     SPECIMEN SUBMITTED: A. Lymph node, left inguinal  CLINICAL HISTORY: 72 year old female with reported history of lymphoma found to have non-specific mildly prominent left inguinal lymph node measuring approximately 3.8 x 0.9 x 1.9 cm on venous U/S of left lower extremity  PRE-OPERATIVE DIAGNOSIS: Mild left inguinal lymphadenopathy  POST-OPERATIVE DIAGNOSIS: Same as above     DIAGNOSIS: A. LYMPH NODE, LEFT INGUINAL; ULTRASOUND-GUIDED BIOPSY: - NO MORPHOLOGIC OR IMMUNOPHENOTYPIC EVIDENCE OF LYMPHOMA. - NEGATIVE FOR CARCINOMA AND GRANULOMATOUS INFLAMMATION. - SEE COMMENT.  Comment: Sections demonstrate three delicate cores of lymph node parenchyma with areas of  crush artifact. A portion of the specimen was submitted to [facility] for flow cytometric analysis.  Per report an immunophenotypic abnormality was not identified. Over all cell viability was less than optimal (54%). If these findings do not correlate with the level of clinical suspicion for a neoplastic process an excisional biopsy may be warranted.  GROSS DESCRIPTION: A. Labeled: Left inguinal lymph node Received: Fresh for lymphoma protocol Tissue fragment(s): 4 Size: Ranging from 0.4 to 1.3 cm in length and 0.1 cm in diameter Description: Needle core biopsy fragments of tan soft tissue.  A touch prep with a Diff-Quik stain is performed.  Representative section is submitted for flow cytometry. The remainder is entirely submitted for routine histology in cassettes 1-3.    Final Diagnosis performed by Quay Burow, MD.   Electronically signed 08/24/2018 2:39:11PM The electronic signature indicates that the named Attending Pathologist has evaluated the specimen  Technical component performed at [facility], [address] [place], [place] [address] Lab: [phone] Dir: Rush Farmer, MD, MMM  Professional component performed at [place], [facility], [address], [place], [place] [address] Lab: [phone] Dir: Dellia Nims. Rubinas, MD      PHQ2/9: Depression screen [facility] 2/9 09/06/2018 08/14/2018 07/07/2018  Decreased Interest 0 0 0  Down, Depressed, Hopeless 0 0 0  PHQ - 2 Score 0 0 0  Altered sleeping 3 - 3  Tired, decreased energy 0 - 3  Change in appetite 0 - 0  Feeling bad or failure about yourself  0 - 0  Trouble concentrating 0 - 0  Moving slowly or fidgety/restless 1 - 0  Suicidal thoughts 0 - 0  PHQ-9 Score 4 - 6  Difficult doing work/chores Not difficult at all - Not difficult at all    Fall Risk: Fall Risk  09/06/2018 08/14/2018 07/07/2018  Falls in the past year? 1 Yes Yes  Number falls in past yr: 1  2 or more 2 or more  Injury with Fall? 1 Yes Yes  Risk Factor Category  - High Fall Risk High Fall Risk  Risk for fall due to : History of fall(s);Impaired balance/gait;Impaired mobility History of fall(s) Impaired balance/gait;Impaired mobility  Follow up - Falls evaluation completed Falls prevention discussed;Education provided;Falls evaluation completed     Functional Status Survey: Is the patient deaf or have difficulty hearing?: No Does the patient have difficulty seeing, even when wearing glasses/contacts?: No Does the patient have difficulty concentrating, remembering, or making decisions?: No Does the patient have difficulty walking or climbing stairs?: Yes Does the patient have difficulty dressing or bathing?: No Does the patient have difficulty doing errands alone such as visiting a doctor's office or shopping?: No  Assessment & Plan  1. Lymphadenopathy  - Ambulatory referral to Oncology  2. Pain in both lower extremities  -  traMADol (ULTRAM) 50 MG tablet; Take 1 tablet (50 mg total) by mouth at bedtime.  Dispense: 30 tablet; Refill: 0 - gabapentin (NEURONTIN) 100 MG capsule; Take 1-3 capsules (100-300 mg total) by mouth at bedtime. Start with one at night and go up by one every 3 days, max of 300 mg at night  Dispense: 90 capsule; Refill: 3  3. Atherosclerosis of aorta (HCC)  Not on aspirin  4. Closed compression fracture of lumbosacral spine, sequela  - traMADol (ULTRAM) 50 MG tablet; Take 1 tablet (50 mg total) by mouth at bedtime.  Dispense: 30 tablet; Refill: 0 - gabapentin (NEURONTIN) 100 MG capsule; Take 1-3 capsules (100-300 mg total) by mouth at bedtime. Start with one at night and go up by one every 3 days, max of 300 mg at night  Dispense: 90 capsule; Refill: 3  5. Leg cramping  Stop requip   6. Cholelithiasis without cholecystitis  Hold off until evaluation by oncologist   7. Personal history of lymphoma  She needs to be seen by oncologist

## 2018-09-07 ENCOUNTER — Ambulatory Visit (INDEPENDENT_AMBULATORY_CARE_PROVIDER_SITE_OTHER): Payer: Medicare HMO

## 2018-09-07 DIAGNOSIS — E119 Type 2 diabetes mellitus without complications: Secondary | ICD-10-CM

## 2018-09-07 DIAGNOSIS — R591 Generalized enlarged lymph nodes: Secondary | ICD-10-CM

## 2018-09-07 NOTE — Chronic Care Management (AMB) (Signed)
  Chronic Care Management   Follow Up Note   09/07/2018 Name: Natasha [name] MRN: [id number] DOB: [birth year]  Referred by: Steele Sizer, MD Reason for referral : Chronic Care Management (Follow up DM)    Subjective: "Everytime I go to Dr. Ancil Boozer I get bad news" "I have cancer but I am not going to let anyone put poison in me"   Assessment: 72 y.o.year old femalereferred to Chronic Care Management by PCP Dr. Laure Kidney assistance with care coordination and disease management related to DM and new diagnosis left groin pain and lymph node swelling/progressively worsening leg pain.She was initially consulted by the CCM Team on 08/14/18 and goals were established. The CCM Team has had difficulty follow up with patient related to incorrect numbers provided. Today, I was able to engage with Natasha [name] on the telephone. She has had multiple medical consults for new and ongoing medical problems since our initial consult including new/recurring dx of possible lymphoma with referral to oncology.   Goals Addressed    . "I don't want my diabetes to get worse" (pt-stated)       Natasha [name] has received her glucose monitor and supplies but states 'its broken because it always reads 450". She states she is eating nothing but fruit and only eats one meal a day. She is willing to come in for a face to face for DM education reinforcement.  Clinical Goal(s): Over the next 30 days, patient will verbalize basic understanding of DM diagnosis and importance of self management activities  Interventions: Face to face follow up scheduled for 09/14/18 at 11:00 for education reinforcement. Patient encouraged to bring meter and medications to visit. Notified PCP of patient re-engagement.      Plan: The CCM Team will assess patient needs and establish goals related to needs during follow up face to face on Thursday 09/14/18 at 11:00     Delle Andrzejewski E. Rollene Rotunda, RN, BSN Nurse Care Coordinator [facility] Care Management  [phone]

## 2018-09-07 NOTE — Patient Instructions (Signed)
1. It was a pleasure to speak with you today.  2. You have an appointment scheduled with the CCM Team for a face to face follow up on Thursday 09/14/18 at 11:00 3. Please call and scheduled your transportation 2 days prior to your appointment with the CCM Team.  CCM (Chronic Care Management) Team   Trish Fountain RN, BSN Nurse Care Coordinator  [phone]  Ruben Reason PharmD  Clinical Pharmacist  [phone]

## 2018-09-08 ENCOUNTER — Inpatient Hospital Stay: Payer: Medicare HMO

## 2018-09-08 ENCOUNTER — Inpatient Hospital Stay: Payer: Medicare HMO | Attending: Internal Medicine | Admitting: Internal Medicine

## 2018-09-08 ENCOUNTER — Other Ambulatory Visit: Payer: Self-pay

## 2018-09-08 ENCOUNTER — Encounter: Payer: Self-pay | Admitting: Internal Medicine

## 2018-09-08 DIAGNOSIS — R05 Cough: Secondary | ICD-10-CM | POA: Insufficient documentation

## 2018-09-08 DIAGNOSIS — Z803 Family history of malignant neoplasm of breast: Secondary | ICD-10-CM | POA: Diagnosis not present

## 2018-09-08 DIAGNOSIS — E119 Type 2 diabetes mellitus without complications: Secondary | ICD-10-CM | POA: Diagnosis not present

## 2018-09-08 DIAGNOSIS — Z801 Family history of malignant neoplasm of trachea, bronchus and lung: Secondary | ICD-10-CM | POA: Insufficient documentation

## 2018-09-08 DIAGNOSIS — C8590 Non-Hodgkin lymphoma, unspecified, unspecified site: Secondary | ICD-10-CM

## 2018-09-08 DIAGNOSIS — R5381 Other malaise: Secondary | ICD-10-CM | POA: Diagnosis not present

## 2018-09-08 DIAGNOSIS — F17210 Nicotine dependence, cigarettes, uncomplicated: Secondary | ICD-10-CM | POA: Diagnosis not present

## 2018-09-08 DIAGNOSIS — Z7984 Long term (current) use of oral hypoglycemic drugs: Secondary | ICD-10-CM | POA: Insufficient documentation

## 2018-09-08 DIAGNOSIS — R6889 Other general symptoms and signs: Secondary | ICD-10-CM | POA: Diagnosis not present

## 2018-09-08 DIAGNOSIS — R591 Generalized enlarged lymph nodes: Secondary | ICD-10-CM | POA: Diagnosis not present

## 2018-09-08 DIAGNOSIS — R5383 Other fatigue: Secondary | ICD-10-CM | POA: Diagnosis not present

## 2018-09-08 DIAGNOSIS — G8929 Other chronic pain: Secondary | ICD-10-CM | POA: Diagnosis not present

## 2018-09-08 DIAGNOSIS — R109 Unspecified abdominal pain: Secondary | ICD-10-CM | POA: Diagnosis not present

## 2018-09-08 DIAGNOSIS — Z79899 Other long term (current) drug therapy: Secondary | ICD-10-CM | POA: Diagnosis not present

## 2018-09-08 LAB — COMPREHENSIVE METABOLIC PANEL
ALT: 12 U/L (ref 0–44)
AST: 16 U/L (ref 15–41)
Albumin: 4.2 g/dL (ref 3.5–5.0)
Alkaline Phosphatase: 82 U/L (ref 38–126)
Anion gap: 11 (ref 5–15)
BUN: 18 mg/dL (ref 8–23)
CO2: 26 mmol/L (ref 22–32)
Calcium: 9 mg/dL (ref 8.9–10.3)
Chloride: 105 mmol/L (ref 98–111)
Creatinine, Ser: 0.85 mg/dL (ref 0.44–1.00)
GFR calc Af Amer: >60 mL/min (ref >60)
GFR calc non Af Amer: >60 mL/min (ref >60)
Glucose, Bld: 100 mg/dL — ABNORMAL HIGH (ref 70–99)
Potassium: 3.7 mmol/L (ref 3.5–5.1)
Sodium: 142 mmol/L (ref 135–145)
Total Bilirubin: 0.8 mg/dL (ref 0.3–1.2)
Total Protein: 6.7 g/dL (ref 6.5–8.1)

## 2018-09-08 LAB — CBC WITH DIFFERENTIAL/PLATELET
Abs Immature Granulocytes: 0.02 10*3/uL (ref 0.00–0.07)
Basophils Absolute: 0 10*3/uL (ref 0.0–0.1)
Basophils Relative: 1 %
Eosinophils Absolute: 0.1 10*3/uL (ref 0.0–0.5)
Eosinophils Relative: 1 %
HCT: 42.2 % (ref 36.0–46.0)
Hemoglobin: 13.9 g/dL (ref 12.0–15.0)
Immature Granulocytes: 0 %
Lymphocytes Relative: 23 %
Lymphs Abs: 1.2 10*3/uL (ref 0.7–4.0)
MCH: 29.3 pg (ref 26.0–34.0)
MCHC: 32.9 g/dL (ref 30.0–36.0)
MCV: 89 fL (ref 80.0–100.0)
Monocytes Absolute: 0.3 10*3/uL (ref 0.1–1.0)
Monocytes Relative: 6 %
Neutro Abs: 3.6 10*3/uL (ref 1.7–7.7)
Neutrophils Relative %: 69 %
Platelets: 190 10*3/uL (ref 150–400)
RBC: 4.74 MIL/uL (ref 3.87–5.11)
RDW: 13.2 % (ref 11.5–15.5)
WBC: 5.2 10*3/uL (ref 4.0–10.5)
nRBC: 0 % (ref 0.0–0.2)

## 2018-09-08 LAB — LACTATE DEHYDROGENASE: LDH: 157 U/L (ref 98–192)

## 2018-09-08 NOTE — Assessment & Plan Note (Addendum)
#  As per patient report suspicious for" low-grade lymphoma".  CT scan abdomen pelvis suggestive of up to 2 cm retroperitoneal adenopathy/periportal/peripancreatic/pelvic adenopathy.  However left inguinal lymph node core biopsy negative for malignancy.   #I  Had a long discussion with the patient regarding the possibility of lymphoma; especially given her prior history.  However patient is adamant that she does not want to talk about the previous diagnosis.  Unfortunately patient seems to have poor insight.  Offered to talk to her daughter patient declined.  #Plan to get labs including CBC CMP LDH hepatitis panel; and also PET scan.  Patient will need repeat biopsy/possible excisional biopsy to confirm diagnosis of lymphoma.  # Smoker-discussed regarding smoking cessation.  Patient is not interested.  # Abdominal pain-chronic.  Question related to above possible diagnosis of lymphoma.  Await GI evaluation as planned.  #Bilateral lower extremity tenderness-unclear etiology.  Recent lower extremity ultrasound negative for any DVT.  Question related to smoking.  Recommend vascular referral/defer to PCP.  Thank you Dr.Sowles for allowing me to participate in the care of your pleasant patient. Please do not hesitate to contact me with questions or concerns in the interim.  # 60 minutes face-to-face with the patient discussing the above plan of care; more than 50% of time spent on prognosis/ natural history; counseling and coordination.   # DISPOSITION: # labs today; PET 3 weeks- Dr.B

## 2018-09-08 NOTE — Telephone Encounter (Signed)
She has seen the oncologist, so I no longer need to speak to her about the CT results.  I am not currently planning any further testing, as the lymphoma appears to explain the abdominal pain.

## 2018-09-08 NOTE — Progress Notes (Signed)
[name], [name], [name]; declines to give further information; [name]; [name]; 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  female has been referred to [place] for further evaluation of lymphadenopathy/diagnosis of lymphoma.  Patient states that she was diagnosed with "lymphoma" of the abdomen in [place] approximately 2 years ago.  However, states that after "divine intervention"-lymphoma disappeared/she was told to be cured.  She did not receive any chemotherapy or radiation therapy.  She is adamant; and does not want to talk about it any further.  Patient complains of abdominal pain for the last many months.  Denies any night sweats.  Denies any weight loss.  No fevers.  Patient complains of soreness in the legs.  Chronic mild cough.  Complains of fatigue/malaise.  Complains of easy bruising.  Review of Systems  Constitutional: Positive for malaise/fatigue. Negative for chills, diaphoresis, fever and weight loss.  HENT: Negative for nosebleeds and sore throat.   Eyes: Negative for double vision.  Respiratory: Positive for cough. Negative for hemoptysis, sputum production, shortness of breath and wheezing.   Cardiovascular: Negative for chest pain, palpitations, orthopnea and leg swelling.  Gastrointestinal: Positive for  abdominal pain. Negative for blood in stool, constipation, diarrhea, heartburn, melena, nausea and vomiting.  Genitourinary: Negative for dysuria, frequency and urgency.  Musculoskeletal: Positive for myalgias. Negative for back pain and joint pain.  Skin: Negative.  Negative for itching and rash.  Neurological: Negative for dizziness, tingling, focal weakness, weakness and headaches.  Endo/Heme/Allergies: Bruises/bleeds easily.  Psychiatric/Behavioral: Negative for depression. The patient is not nervous/anxious and does not have insomnia.      MEDICAL HISTORY:  Past Medical History:  Diagnosis Date  . Cancer [facility])    lymphoma-stomach   . Diabetes mellitus without complication ([place])     SURGICAL HISTORY: Past Surgical History:  Procedure Laterality Date  . TONSILLECTOMY Bilateral    as a child    SOCIAL HISTORY: Social History   Socioeconomic History  . Marital status: Widowed    Spouse name: Not on file  . Number of children: 2  . Years of education: Not on file  . Highest education level: 9th grade  Occupational History  . Occupation: unemployed  Social Needs  . Financial resource strain: Very hard  . Food insecurity:    Worry: Often true    Inability: Often true  . Transportation needs:    Medical: No    Non-medical: No  Tobacco Use  . Smoking status: Current Every Day Smoker    Packs/day: 1.00    Years: 55.00    Pack years: 55.00    Types: Cigarettes    Start date: 07/07/1961  . Smokeless tobacco: Never Used  Substance and Sexual Activity  . Alcohol use: No  . Drug use: Never  . Sexual activity: Not Currently    Partners: Male  Lifestyle  . Physical activity:  Days per week: 7 days    Minutes per session: 10 min  . Stress: Not at all  Relationships  . Social connections:    Talks on phone: Never    Gets together: More than three times a week    Attends religious service: More than 4 times per year    Active member of club or organization: No     Attends meetings of clubs or organizations: Never    Relationship status: Widowed  . Intimate partner violence:    Fear of current or ex partner: No    Emotionally abused: No    Physically abused: No    Forced sexual activity: No  Other Topics Concern  . Not on file  Social History Narrative   Patient moved from [place] and lives with her daughter; active smoker.  Denies any alcohol abuse.      She has 1 son, 1 daughter, 8 grandchildren, & 2 great-grandchildren.    FAMILY HISTORY: Family History  Problem Relation Age of Onset  . Cancer Mother        breast  . Cancer Father        lung  . Alzheimer's disease Father   . Varicose Veins Brother     ALLERGIES:  has No Known Allergies.  MEDICATIONS:  Current Outpatient Medications  Medication Sig Dispense Refill  . ACCU-CHEK SOFTCLIX LANCETS lancets Use as instructed 100 each 12  . Blood Glucose Monitoring Suppl (ACCU-CHEK AVIVA PLUS) w/Device KIT Place 1 each into alternate nostrils 2 (two) times daily. 1 kit 0  . glipiZIDE (GLUCOTROL XL) 5 MG 24 hr tablet Take 1 tablet (5 mg total) by mouth daily with breakfast. 90 tablet 0  . glucose blood (ACCU-CHEK AVIVA PLUS) test strip Use as instructed 100 each 12  . metFORMIN (GLUCOPHAGE-XR) 750 MG 24 hr tablet Take 2 tablets (1,500 mg total) by mouth daily with breakfast. 180 tablet 0  . omeprazole (PRILOSEC) 40 MG capsule Take 1 capsule (40 mg total) by mouth daily. 90 capsule 0  . gabapentin (NEURONTIN) 100 MG capsule Take 1-3 capsules (100-300 mg total) by mouth at bedtime. Start with one at night and go up by one every 3 days, max of 300 mg at night (Patient not taking: Reported on 09/08/2018) 90 capsule 3  . traMADol (ULTRAM) 50 MG tablet Take 1 tablet (50 mg total) by mouth at bedtime. (Patient not taking: Reported on 09/08/2018) 30 tablet 0   No current facility-administered medications for this visit.       Marland Kitchen  PHYSICAL EXAMINATION: ECOG PERFORMANCE STATUS: 1 - Symptomatic  but completely ambulatory  Vitals:   09/08/18 1515  BP: 116/78  Pulse: 85  Resp: 20  Temp: 97.8 F (36.6 C)   There were no vitals filed for this visit.  Physical Exam  Constitutional: She is oriented to person, place, and time and well-developed, well-nourished, and in no distress.  HENT:  Head: Normocephalic and atraumatic.  Mouth/Throat: Oropharynx is clear and moist. No oropharyngeal exudate.  Eyes: Pupils are equal, round, and reactive to light.  Neck: Normal range of motion. Neck supple.  Cardiovascular: Normal rate and regular rhythm.  Pulmonary/Chest: No respiratory distress. She has no wheezes.  Abdominal: Soft. Bowel sounds are normal. She exhibits no distension and no mass. There is no tenderness. There is no rebound and no guarding.  Musculoskeletal: Normal range of motion. She exhibits tenderness. She exhibits no edema.  Neurological: She is alert and oriented to person, place, and  time.  Skin: Skin is warm.  Psychiatric: Affect normal.     LABORATORY DATA:  I have reviewed the data as listed Lab Results  Component Value Date   WBC 5.2 09/08/2018   HGB 13.9 09/08/2018   HCT 42.2 09/08/2018   MCV 89.0 09/08/2018   PLT 190 09/08/2018   Recent Labs    06/11/18 1308 07/07/18 0955 08/16/18 1445 09/08/18 1607  NA 139 139  --  142  K 4.3 4.1  --  3.7  CL 105 107  --  105  CO2 29 28  --  26  GLUCOSE 260* 324*  --  100*  BUN _0 CREATININE 0.77 0.92 0.86 0.85  CALCIUM 8.6* 9.2  --  9.0  GFRNONAA >60 63  --  >60  GFRAA >60 73  --  >60  PROT  --  5.8*  --  6.7  ALBUMIN  --   --   --  4.2  AST  --  8*  --  16  ALT  --  7  --  12  ALKPHOS  --   --   --  82  BILITOT  --  0.6  --  0.8    RADIOGRAPHIC STUDIES: I have personally reviewed the radiological images as listed and agreed with the findings in the report. Ct Abdomen Pelvis W Contrast  Result Date: 08/29/2018 CLINICAL DATA:  Periumbilical abdominal pain lower abdominal pain for 1 month.  Postprandial discomfort. EXAM: CT ABDOMEN AND PELVIS WITH CONTRAST TECHNIQUE: Multidetector CT imaging of the abdomen and pelvis was performed using the standard protocol following bolus administration of intravenous contrast. CONTRAST:  174m ISOVUE-300 IOPAMIDOL (ISOVUE-300) INJECTION 61% COMPARISON:  None. FINDINGS: Lower chest: 3.1 by 2.0 cm bulla of the left lower lobe medially on image 6/3. Mild scarring or atelectasis in the lingula. Just above the hiatus there is a 1.2 cm periaortic lymph node on image 15/2. Hepatobiliary: Punctate calcifications in the right hepatic lobe favoring old granulomatous disease. Mild wall thickening in the somewhat contracted gallbladder with evidence of gallstones. No biliary dilatation. Pancreas: Unremarkable Spleen: Spleen measures 11.1 by 6.3 by 15.4 cm (volume = 560 cm^3). No focal splenic lesion. Adrenals/Urinary Tract: Both adrenal glands appear normal. Mild but likely age-appropriate renal atrophy. Exophytic 8 mm hypodense lesion of the left kidney lower pole is technically nonspecific but statistically likely represent a benign cyst. Urinary bladder unremarkable. Stomach/Bowel: Unremarkable Vascular/Lymphatic: Mild abdominal aortic atherosclerotic calcification. Patent celiac trunk, SMA, and inferior mesenteric artery. Abnormal accentuated size and number of peripancreatic, porta hepatis, retroperitoneal, upper abdominal central mesenteric, right external iliac, and bilateral inguinal lymph nodes. Index porta hepatis lymph node 1.3 cm in short axis, image 20/2. Portacaval node 2.0 cm in short axis, image 26/2. Left periaortic node 1.1 cm in short axis, image 31/2. Central mesenteric node 1.2 cm in short axis, image 39/2. Dominant right external iliac lymph node, 2.1 cm in short axis on image 70/2. Right inguinal lymph node 1.2 cm in short axis, image 78/2. Reproductive: Unremarkable Other: No supplemental non-categorized findings. Musculoskeletal: Superior and inferior  endplate compressions at L2 appears slightly more pronounced than on the radiographs from 10/03/2016 suggesting mild progression. Chronic superior endplate compression fracture of L5, stable. IMPRESSION: 1. Mild splenomegaly with upper abdominal and pelvic adenopathy concerning for lymphoma/lymphoproliferative disease. No focal splenic lesion identified. There is also mildly enlarged lower para-aortic thoracic lymph node. 2. Gallbladder wall thickening may be secondary to gallbladder contraction, but they are  believed to be multiple gallstones. 3.  Aortoiliac atherosclerotic vascular disease. 4. Chronic superior endplate compression fracture of L5. Superior and inferior endplate compression fractures at L2 appear mildly progressive compared to 10/03/2016 although may also be chronic. These results will be called to the ordering clinician or representative by the Radiologist Assistant, and communication documented in the PACS or zVision Dashboard. Electronically Signed   By: Van Clines M.D.   On: 08/29/2018 14:49   US Venous Img Lower Unilateral Left  Result Date: 08/14/2018 CLINICAL DATA:  Left lower extremity pain and edema. EXAM: LEFT LOWER EXTREMITY VENOUS DOPPLER ULTRASOUND TECHNIQUE: Gray-scale sonography with graded compression, as well as color Doppler and duplex ultrasound were performed to evaluate the lower extremity deep venous systems from the level of the common femoral vein and including the common femoral, femoral, profunda femoral, popliteal and calf veins including the posterior tibial, peroneal and gastrocnemius veins when visible. The superficial great saphenous vein was also interrogated. Spectral Doppler was utilized to evaluate flow at rest and with distal augmentation maneuvers in the common femoral, femoral and popliteal veins. COMPARISON:  None. FINDINGS: Contralateral Common Femoral Vein: Respiratory phasicity is normal and symmetric with the symptomatic side. No evidence of  thrombus. Normal compressibility. Common Femoral Vein: No evidence of thrombus. Normal compressibility, respiratory phasicity and response to augmentation. Saphenofemoral Junction: No evidence of thrombus. Normal compressibility and flow on color Doppler imaging. Profunda Femoral Vein: No evidence of thrombus. Normal compressibility and flow on color Doppler imaging. Femoral Vein: No evidence of thrombus. Normal compressibility, respiratory phasicity and response to augmentation. Popliteal Vein: No evidence of thrombus. Normal compressibility, respiratory phasicity and response to augmentation. Calf Veins: No evidence of thrombus. Normal compressibility and flow on color Doppler imaging. Superficial Great Saphenous Vein: No evidence of thrombus. Normal compressibility. Venous Reflux:  None. Other Findings: No evidence of superficial thrombophlebitis or abnormal fluid collection. Nonspecific mildly prominent left inguinal lymph node measures approximately 3.8 x 0.9 x 1.9 cm. Although the short axis is not overtly enlarged, the sonographic appearance of this lymph node suggests a possible pathologic lymph node and correlation is suggested with physical exam and any prior hematologic history. IMPRESSION: 1. No evidence of left lower extremity deep venous thrombosis. 2. Mildly prominent left inguinal lymph node with sonographic appearance suggesting possible pathologic lymph node. Correlation suggested with physical exam and any prior hematologic medical history. Electronically Signed   By: Aletta Edouard M.D.   On: 08/14/2018 11:33   [place] Core Biopsy (lymph Nodes)  Result Date: 08/22/2018 INDICATION: Left inguinal adenopathy.  History of lymphoma. EXAM: ULTRASOUND-GUIDED LEFT INGUINAL LYMPH NODE CORE BIOPSY MEDICATIONS: None. ANESTHESIA/SEDATION: Moderate (conscious) sedation was employed during this procedure. A total of Versed 2 mg and Fentanyl 100 mcg was administered intravenously. Moderate Sedation Time: 6  minutes. The patient's level of consciousness and vital signs were monitored continuously by radiology nursing throughout the procedure under my direct supervision. FLUOROSCOPY TIME:  Fluoroscopy Time:  minutes  seconds ( mGy). COMPLICATIONS: None immediate. PROCEDURE: Informed written consent was obtained from the patient after a thorough discussion of the procedural risks, benefits and alternatives. All questions were addressed. Maximal Sterile Barrier Technique was utilized including caps, mask, sterile gowns, sterile gloves, sterile drape, hand hygiene and skin antiseptic. A timeout was performed prior to the initiation of the procedure. The left inguinal region was prepped and draped in a sterile fashion. 1% lidocaine was utilized for local anesthesia. Under sonographic guidance, 4 18 gauge core biopsies of the enlarged  left inguinal lymph node were obtained. FINDINGS: Imaging confirms needle placement in the left inguinal lymph node. Post biopsy images demonstrate no evidence of hemorrhage. IMPRESSION: Successful ultrasound-guided core biopsy of a left inguinal lymph node. Electronically Signed   By: Marybelle Killings M.D.   On: 08/22/2018 11:34    ASSESSMENT & PLAN:   Generalized lymphadenopathy     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HCC) #As per patient report suspicious for" low-grade lymphoma".  CT scan abdomen pelvis suggestive of up to 2 cm retroperitoneal adenopathy/periportal/peripancreatic/pelvic adenopathy.  However left inguinal lymph node core biopsy negative for malignancy.   #I  Had a long discussion with the patient regarding the possibility of lymphoma; especially given her prior history.  However patient is adamant that she does not want to talk about the previous diagnosis.  Unfortunately patient seems to have poor insight.  Offered to talk to her daughter patient declined.  #Plan to get labs including CBC CMP LDH hepatitis panel; and also PET scan.  Patient will need repeat  biopsy/possible excisional biopsy to confirm diagnosis of lymphoma.  # Smoker-discussed regarding smoking cessation.  Patient is not interested.  # Abdominal pain-chronic.  Question related to above possible diagnosis of lymphoma.  Await GI evaluation as planned.  #Bilateral lower extremity tenderness-unclear etiology.  Recent lower extremity ultrasound negative for any DVT.  Question related to smoking.  Recommend vascular referral/defer to PCP.  Thank you Dr.Sowles for allowing me to participate in the care of your pleasant patient. Please do not hesitate to contact me with questions or concerns in the interim.  # DISPOSITION: # labs today; PET 3 weeks- Dr.B     All questions were answered. The patient knows to call the clinic with any problems, questions or concerns.       Cammie Sickle, MD 09/11/2018 7:11 AM

## 2018-09-08 NOTE — Progress Notes (Signed)
Pt highly anxious. Patient states that she was previously dx with lymphoma/leukemia. Patient very adamant about her fears and concerns regarding receiving "cancer treatment. I do not want to take any cancer treatments. I do not want to loose my hair or have any side effects. She is a poor historian to her care. She does not recall the exact hospital where she was treated in [place]. She explains that his experience was 1 year ago. She states that she had repeat imaging while in [place] and no abnormality was found. "I was healed at a church service and no evidence of any abnormal found at that time."

## 2018-09-09 LAB — HEPATITIS B SURFACE ANTIGEN: Hepatitis B Surface Ag: NEGATIVE

## 2018-09-09 LAB — HEPATITIS B CORE ANTIBODY, IGM: Hep B C IgM: NEGATIVE

## 2018-09-11 ENCOUNTER — Encounter: Payer: Self-pay | Admitting: Internal Medicine

## 2018-09-11 NOTE — Telephone Encounter (Signed)
Left patient a detailed message with Dr. Loletha Carrow' response. Oncology will now follow her for abdominal pain.

## 2018-09-12 ENCOUNTER — Telehealth: Payer: Self-pay

## 2018-09-12 ENCOUNTER — Ambulatory Visit: Payer: Self-pay

## 2018-09-12 DIAGNOSIS — E119 Type 2 diabetes mellitus without complications: Secondary | ICD-10-CM

## 2018-09-12 DIAGNOSIS — M79605 Pain in left leg: Secondary | ICD-10-CM

## 2018-09-12 DIAGNOSIS — M79604 Pain in right leg: Secondary | ICD-10-CM

## 2018-09-12 DIAGNOSIS — Z8572 Personal history of non-Hodgkin lymphomas: Secondary | ICD-10-CM

## 2018-09-12 LAB — MULTIPLE MYELOMA PANEL, SERUM
Albumin SerPl Elph-Mcnc: 3.9 g/dL (ref 2.9–4.4)
Albumin/Glob SerPl: 1.8 — ABNORMAL HIGH (ref 0.7–1.7)
Alpha 1: 0.2 g/dL (ref 0.0–0.4)
Alpha2 Glob SerPl Elph-Mcnc: 0.6 g/dL (ref 0.4–1.0)
B-Globulin SerPl Elph-Mcnc: 0.8 g/dL (ref 0.7–1.3)
Gamma Glob SerPl Elph-Mcnc: 0.5 g/dL (ref 0.4–1.8)
Globulin, Total: 2.2 g/dL (ref 2.2–3.9)
IgA: 36 mg/dL — ABNORMAL LOW (ref 87–352)
IgG (Immunoglobin G), Serum: 598 mg/dL — ABNORMAL LOW (ref 700–1600)
IgM (Immunoglobulin M), Srm: 28 mg/dL (ref 26–217)
Total Protein ELP: 6.1 g/dL (ref 6.0–8.5)

## 2018-09-12 NOTE — Chronic Care Management (AMB) (Signed)
  Chronic Care Management Note   72 y.o.year old femalereferred to Chronic Care Management by PCP Dr. Laure Kidney assistance with care coordination and disease management related to DM and new diagnosis left groin pain and lymph node swelling/progressively worsening leg pain.Patient has also recently been diagnosed/re-diagnosed with lymphoma and had an initial oncology consult 09/08/18.  Patient recently agreed to a follow up face to face appointment with the CCM Team for additional eduction related to her DM and care coordination related to her multiple referrals to specialist.  Was unable to reach patient via telephone today for a reminder of her appointment with the CCM team on 09/14/18 at 11:00. I have left a detailed VM message reminder of appointment time and date as requested by patient during last encounter.  Plan: Will follow-up with patient at her face to face visit.    Quintavious Rinck E. Rollene Rotunda, RN, BSN Nurse Care Coordinator [facility] Care Management  [phone]

## 2018-09-14 ENCOUNTER — Ambulatory Visit: Payer: Medicare HMO

## 2018-09-14 ENCOUNTER — Ambulatory Visit: Payer: Self-pay

## 2018-09-14 NOTE — Chronic Care Management (AMB) (Signed)
  Chronic Care Management   Note  09/14/2018 Name: Natasha [name] MRN: [id number] DOB: May 30, 1946   72 y.o.year old femalereferred to Chronic Care Management by PCP Dr. Laure Kidney assistance with care coordination and disease management related to DM and new diagnosis left groin pain and lymph node swelling/progressively worsening leg pain.Patient has also recently been diagnosed/re-diagnosed with lymphoma and had an initial oncology consult 09/08/18.  Patient recently agreed to a follow up face to face appointment with the CCM Team for additional eduction related to her DM and care coordination related to her multiple referrals to specialist. She was scheduled for a follow up face to face appointment with the team today, 09/14/18 at 11:00.  Patient was a no call no show for appointment. CCM RN CM placed an unsuccessful telephone outreach call to Ms. Fillingim and left a HIPAA compliant VM message requesting a return call.   Plan: The CCM Team will outreach to Ms. Geise over the next 3-5 days.    Tobey Lippard E. Rollene Rotunda, RN, BSN Nurse Care Coordinator [facility] / [facility] Care Management  [phone]

## 2018-09-19 ENCOUNTER — Telehealth: Payer: Self-pay

## 2018-09-19 ENCOUNTER — Ambulatory Visit: Payer: Self-pay

## 2018-09-19 DIAGNOSIS — E119 Type 2 diabetes mellitus without complications: Secondary | ICD-10-CM

## 2018-09-19 DIAGNOSIS — R591 Generalized enlarged lymph nodes: Secondary | ICD-10-CM

## 2018-09-19 NOTE — Chronic Care Management (AMB) (Signed)
  Chronic Care Management   Note  09/19/2018 Name: Natasha [name] MRN: [id number] DOB: 05-Apr-1946  72 y.o.year old femalereferred to Chronic Care Management by PCP Dr. Laure Kidney assistance with care coordination and disease management related to DM and new diagnosis left groin pain and lymph node swelling/progressively worsening leg pain.Patient has also recently been diagnosed/re-diagnosed with lymphoma and had an initial oncology consult 09/08/18.  Patient recently agreed to a follow up face to face appointment with the CCM Team for additional eduction related to her DM and care coordination related to her multiple referrals to specialist. She was scheduled for a follow up face to face appointment with the team on 09/14/18 at 11:00.  Patient was a no call no show for appointment. CCM RN CM placed an unsuccessful telephone outreach call (#2) to [name] and left a HIPAA compliant VM message requesting a return call.   Plan: The CCM Team will outreach to [name] over the next 3-5 days.   [name] E. Rollene Rotunda, RN, BSN Nurse Care Coordinator [facility] / [facility] Care Management  [phone]

## 2018-09-20 ENCOUNTER — Ambulatory Visit
Admission: RE | Admit: 2018-09-20 | Discharge: 2018-09-20 | Disposition: A | Discharge status: Home / Self Care | Payer: Medicare HMO | Source: Ambulatory Visit | Attending: Family Medicine | Admitting: Family Medicine

## 2018-09-20 ENCOUNTER — Ambulatory Visit: Payer: Medicare HMO

## 2018-09-20 DIAGNOSIS — Z1231 Encounter for screening mammogram for malignant neoplasm of breast: Secondary | ICD-10-CM | POA: Insufficient documentation

## 2018-09-20 DIAGNOSIS — Z1239 Encounter for other screening for malignant neoplasm of breast: Secondary | ICD-10-CM | POA: Diagnosis present

## 2018-09-20 DIAGNOSIS — R6889 Other general symptoms and signs: Secondary | ICD-10-CM | POA: Diagnosis not present

## 2018-09-26 ENCOUNTER — Telehealth: Payer: Self-pay

## 2018-09-26 ENCOUNTER — Ambulatory Visit: Payer: Self-pay

## 2018-09-26 DIAGNOSIS — R591 Generalized enlarged lymph nodes: Secondary | ICD-10-CM

## 2018-09-26 DIAGNOSIS — E119 Type 2 diabetes mellitus without complications: Secondary | ICD-10-CM

## 2018-09-26 DIAGNOSIS — C8590 Non-Hodgkin lymphoma, unspecified, unspecified site: Secondary | ICD-10-CM

## 2018-09-26 NOTE — Chronic Care Management (AMB) (Signed)
  Chronic Care Management   Note  09/26/2018 Name: Annalucia Laino Arlotta MRN: [id number] DOB: 1946-03-02   3rd unsuccessful telephone outreach attempt to Ms. Prentice Docker Thune today. We will discontinue outreach calls but will gladly be available at any time to provide care management services to Ms. Prentice Docker Nierenberg should she choose to re-engage with the CCM Team. CCM Team contact information previously provided.     Srihitha Tagliaferri E. Rollene Rotunda, RN, BSN Nurse Care Coordinator [facility] / [facility] Care Management  [phone]

## 2018-09-26 NOTE — Telephone Encounter (Signed)
Message from Conception Chancy, NT sent at 09/26/2018 2:23 PM EST   Patient returning phone call ----- Message from Natasha [name] sent at 09/26/2018 12:14 PM EST ----- Pt calling stating that she want to stop taking these three medicines she haven't taking medicine since thanksgiving Because they made her have diarrhea and abd pain with vomiting she states that none of these medicines work for her abd pain and leg pain and swelling gabapentin (NEURONTIN) 100 MG capsule glipiZIDE (GLUCOTROL XL) 5 MG 24 hr tablet traMADol (ULTRAM) 50 MG tablet [phone] please call this number until her phones get cut back on

## 2018-09-27 ENCOUNTER — Ambulatory Visit: Payer: Medicare HMO

## 2018-09-27 NOTE — Telephone Encounter (Signed)
She needs to be seen, please ask her to take glipizide only until she comes in to discuss other options

## 2018-09-27 NOTE — Telephone Encounter (Signed)
Patient was informed of Dr. Ancil Boozer message and stated that the Glipizide was the one that made her stomach hurt. I then transferred her upfront to be scheduled for a visit.

## 2018-09-29 ENCOUNTER — Encounter
Admission: RE | Admit: 2018-09-29 | Discharge: 2018-09-29 | Disposition: A | Discharge status: Home / Self Care | Payer: Medicare HMO | Source: Ambulatory Visit | Attending: Internal Medicine | Admitting: Internal Medicine

## 2018-09-29 ENCOUNTER — Inpatient Hospital Stay: Payer: Medicare HMO | Admitting: Internal Medicine

## 2018-09-29 ENCOUNTER — Telehealth: Payer: Self-pay

## 2018-09-29 DIAGNOSIS — C8591 Non-Hodgkin lymphoma, unspecified, lymph nodes of head, face, and neck: Secondary | ICD-10-CM | POA: Diagnosis not present

## 2018-09-29 DIAGNOSIS — C8590 Non-Hodgkin lymphoma, unspecified, unspecified site: Secondary | ICD-10-CM

## 2018-09-29 DIAGNOSIS — R6889 Other general symptoms and signs: Secondary | ICD-10-CM | POA: Diagnosis not present

## 2018-09-29 DIAGNOSIS — R591 Generalized enlarged lymph nodes: Secondary | ICD-10-CM | POA: Diagnosis not present

## 2018-09-29 LAB — GLUCOSE, CAPILLARY: Glucose-Capillary: 125 mg/dL — ABNORMAL HIGH (ref 70–99)

## 2018-09-29 MED ORDER — FLUDEOXYGLUCOSE F - 18 (FDG) INJECTION
9.4000 | Freq: Once | INTRAVENOUS | Status: AC | PRN
  Administered 2018-09-29: 9.14 via INTRAVENOUS

## 2018-09-29 NOTE — Assessment & Plan Note (Deleted)
#  As per patient report suspicious for" low-grade lymphoma".  CT scan abdomen pelvis suggestive of up to 2 cm retroperitoneal adenopathy/periportal/peripancreatic/pelvic adenopathy.  However left inguinal lymph node core biopsy negative for malignancy.   #I  Had a long discussion with the patient regarding the possibility of lymphoma; especially given her prior history.  However patient is adamant that she does not want to talk about the previous diagnosis.  Unfortunately patient seems to have poor insight.  Offered to talk to her daughter patient declined.  #Plan to get labs including CBC CMP LDH hepatitis panel; and also PET scan.  Patient will need repeat biopsy/possible excisional biopsy to confirm diagnosis of lymphoma.  # Smoker-discussed regarding smoking cessation.  Patient is not interested.  # Abdominal pain-chronic.  Question related to above possible diagnosis of lymphoma.  Await GI evaluation as planned.  #Bilateral lower extremity tenderness-unclear etiology.  Recent lower extremity ultrasound negative for any DVT.  Question related to smoking.  Recommend vascular referral/defer to PCP.  Thank you Dr.Sowles for allowing me to participate in the care of your pleasant patient. Please do not hesitate to contact me with questions or concerns in the interim.  # 60 minutes face-to-face with the patient discussing the above plan of care; more than 50% of time spent on prognosis/ natural history; counseling and coordination.   # DISPOSITION: # labs today; PET 3 weeks- Dr.B

## 2018-09-29 NOTE — Telephone Encounter (Signed)
I attempted to contact patient and ask about no show to her appointment today at 2:45. Patient did not answer, but I left a voicemail message for patient to return phone call and we can get her rescheduled to discuss results.

## 2018-09-29 NOTE — Progress Notes (Deleted)
[facility] CONSULT NOTE  Patient Care Team: Steele Sizer, [name], [name]; declines to give further information; [name]; [name]; 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  female has been referred to [place] for further evaluation of lymphadenopathy/diagnosis of lymphoma.  Patient states that she was diagnosed with "lymphoma" of the abdomen in [place] approximately 2 years ago.  However, states that after "divine intervention"-lymphoma disappeared/she was told to be cured.  She did not receive any chemotherapy or radiation therapy.  She is adamant; and does not want to talk about it any further.  Patient complains of abdominal pain for the last many months.  Denies any night sweats.  Denies any weight loss.  No fevers.  Patient complains of soreness in the legs.  Chronic mild cough.  Complains of fatigue/malaise.  Complains of easy bruising.  Review of Systems  Constitutional: Positive for malaise/fatigue. Negative for chills, diaphoresis, fever and weight loss.  HENT: Negative for nosebleeds and sore throat.   Eyes: Negative for double vision.  Respiratory: Positive for cough. Negative for hemoptysis, sputum production, shortness of breath and wheezing.   Cardiovascular: Negative for chest pain, palpitations, orthopnea and leg swelling.  Gastrointestinal: Positive for  abdominal pain. Negative for blood in stool, constipation, diarrhea, heartburn, melena, nausea and vomiting.  Genitourinary: Negative for dysuria, frequency and urgency.  Musculoskeletal: Positive for myalgias. Negative for back pain and joint pain.  Skin: Negative.  Negative for itching and rash.  Neurological: Negative for dizziness, tingling, focal weakness, weakness and headaches.  Endo/Heme/Allergies: Bruises/bleeds easily.  Psychiatric/Behavioral: Negative for depression. The patient is not nervous/anxious and does not have insomnia.      MEDICAL HISTORY:  Past Medical History:  Diagnosis Date  . Cancer [facility])    lymphoma-stomach   . 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 Diabetes mellitus without complication ([place])     SURGICAL HISTORY: Past Surgical History:  Procedure Laterality Date  . TONSILLECTOMY Bilateral    as a child    SOCIAL HISTORY: Social History   Socioeconomic History  . Marital status: Widowed    Spouse name: Not on file  . Number of children: 2  . Years of education: Not on file  . Highest education level: 9th grade  Occupational History  . Occupation: unemployed  Social Needs  . Financial resource strain: Very hard  . Food insecurity:    Worry: Often true    Inability: Often true  . Transportation needs:    Medical: No    Non-medical: No  Tobacco Use  . Smoking status: Current Every Day Smoker    Packs/day: 1.00    Years: 55.00    Pack years: 55.00    Types: Cigarettes    Start date: 07/07/1961  . Smokeless tobacco: Never Used  Substance and Sexual Activity  . Alcohol use: No  . Drug use: Never  . Sexual activity: Not Currently  Partners: Male  Lifestyle  . Physical activity:    Days per week: 7 days    Minutes per session: 10 min  . Stress: Not at all  Relationships  . Social connections:    Talks on phone: Never    Gets together: More than three times a week    Attends religious service: More than 4 times per year    Active member of  club or organization: No    Attends meetings of clubs or organizations: Never    Relationship status: Widowed  . Intimate partner violence:    Fear of current or ex partner: No    Emotionally abused: No    Physically abused: No    Forced sexual activity: No  Other Topics Concern  . Not on file  Social History Narrative   Patient moved from [place] and lives with her daughter; active smoker.  Denies any alcohol abuse.      She has 1 son, 1 daughter, 8 grandchildren, & 2 great-grandchildren.    FAMILY HISTORY: Family History  Problem Relation Age of Onset  . Cancer Mother        breast  . Cancer Father        lung  . Alzheimer's disease Father   . Varicose Veins Brother   . Breast cancer Neg Hx     ALLERGIES:  has No Known Allergies.  MEDICATIONS:  Current Outpatient Medications  Medication Sig Dispense Refill  . ACCU-CHEK SOFTCLIX LANCETS lancets Use as instructed 100 each 12  . Blood Glucose Monitoring Suppl (ACCU-CHEK AVIVA PLUS) w/Device KIT Place 1 each into alternate nostrils 2 (two) times daily. 1 kit 0  . gabapentin (NEURONTIN) 100 MG capsule Take 1-3 capsules (100-300 mg total) by mouth at bedtime. Start with one at night and go up by one every 3 days, max of 300 mg at night (Patient not taking: Reported on 09/08/2018) 90 capsule 3  . glipiZIDE (GLUCOTROL XL) 5 MG 24 hr tablet Take 1 tablet (5 mg total) by mouth daily with breakfast. 90 tablet 0  . glucose blood (ACCU-CHEK AVIVA PLUS) test strip Use as instructed 100 each 12  . metFORMIN (GLUCOPHAGE-XR) 750 MG 24 hr tablet Take 2 tablets (1,500 mg total) by mouth daily with breakfast. 180 tablet 0  . omeprazole (PRILOSEC) 40 MG capsule Take 1 capsule (40 mg total) by mouth daily. 90 capsule 0  . traMADol (ULTRAM) 50 MG tablet Take 1 tablet (50 mg total) by mouth at bedtime. (Patient not taking: Reported on 09/08/2018) 30 tablet 0   No current facility-administered medications for this visit.       Marland Kitchen  PHYSICAL  EXAMINATION: ECOG PERFORMANCE STATUS: 1 - Symptomatic but completely ambulatory  There were no vitals filed for this visit. There were no vitals filed for this visit.  Physical Exam  Constitutional: She is oriented to person, place, and time and well-developed, well-nourished, and in no distress.  HENT:  Head: Normocephalic and atraumatic.  Mouth/Throat: Oropharynx is clear and moist. No oropharyngeal exudate.  Eyes: Pupils are equal, round, and reactive to light.  Neck: Normal range of motion. Neck supple.  Cardiovascular: Normal rate and regular rhythm.  Pulmonary/Chest: No respiratory distress. She has no wheezes.  Abdominal: Soft. Bowel sounds are normal. She exhibits no distension and no mass. There is no tenderness. There is no rebound and no guarding.  Musculoskeletal: Normal range of motion. She exhibits tenderness. She exhibits no edema.  Neurological: She is alert and oriented  to person, place, and time.  Skin: Skin is warm.  Psychiatric: Affect normal.     LABORATORY DATA:  I have reviewed the data as listed Lab Results  Component Value Date   WBC 5.2 09/08/2018   HGB 13.9 09/08/2018   HCT 42.2 09/08/2018   MCV 89.0 09/08/2018   PLT 190 09/08/2018   Recent Labs    06/11/18 1308 07/07/18 0955 08/16/18 1445 09/08/18 1607  NA 139 139  --  142  K 4.3 4.1  --  3.7  CL 105 107  --  105  CO2 29 28  --  26  GLUCOSE 260* 324*  --  100*  BUN _0 CREATININE 0.77 0.92 0.86 0.85  CALCIUM 8.6* 9.2  --  9.0  GFRNONAA >60 63  --  >60  GFRAA >60 73  --  >60  PROT  --  5.8*  --  6.7  ALBUMIN  --   --   --  4.2  AST  --  8*  --  16  ALT  --  7  --  12  ALKPHOS  --   --   --  82  BILITOT  --  0.6  --  0.8    RADIOGRAPHIC STUDIES: I have personally reviewed the radiological images as listed and agreed with the findings in the report. No results found.  ASSESSMENT & PLAN:   No problem-specific Assessment & Plan notes found for this encounter.    All  questions were answered. The patient knows to call the clinic with any problems, questions or concerns.       Cammie Sickle, MD 09/29/2018 1:01 PM

## 2018-10-02 ENCOUNTER — Ambulatory Visit (INDEPENDENT_AMBULATORY_CARE_PROVIDER_SITE_OTHER): Payer: Medicare HMO | Admitting: Family Medicine

## 2018-10-02 ENCOUNTER — Encounter: Payer: Self-pay | Admitting: Family Medicine

## 2018-10-02 VITALS — BP 112/68 | HR 88 | Temp 97.8°F | Resp 16 | Ht 63.0 in | Wt 177.5 lb

## 2018-10-02 DIAGNOSIS — R6889 Other general symptoms and signs: Secondary | ICD-10-CM | POA: Diagnosis not present

## 2018-10-02 DIAGNOSIS — M79605 Pain in left leg: Secondary | ICD-10-CM

## 2018-10-02 DIAGNOSIS — C8590 Non-Hodgkin lymphoma, unspecified, unspecified site: Secondary | ICD-10-CM | POA: Diagnosis not present

## 2018-10-02 DIAGNOSIS — M79604 Pain in right leg: Secondary | ICD-10-CM

## 2018-10-02 DIAGNOSIS — R1084 Generalized abdominal pain: Secondary | ICD-10-CM

## 2018-10-02 NOTE — Progress Notes (Signed)
Name: Natasha [name]   MRN: [id number]    DOB: 08/18/1946   Date:10/02/2018       Progress Note  Subjective  Chief Complaint  Chief Complaint  Patient presents with  . Abdominal Pain    Tried taking Omaprazole but made her sick to her stomach-nausea, vomiting, diarrhea and states she is freezing. Patient would like to find out what is going with her stomach instead of taking another pill.    HPI  Lymphoma: patient diagnosed with lymphoma many years ago in [place], she came here and was complaining of leg pain, abdominal pain. Had CT and found to have lymphadenopathy. Saw GI but wanted to wait for oncologist evaluation. Omeprazole caused worsening of symptoms and she stopped medication. She has lost some weight, no change in bowel movements. States pain on abdomen is all over, no fever or chills.   Leg pain and edema: left worse than right. Negative for DVT, but continues to have edema of left leg, oncologist recommended evaluation by vascular surgeon    Patient Active Problem List   Diagnosis Date Noted  . Generalized lymphadenopathy 09/08/2018  . 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 SI (sacroiliac) joint inflammation ([place]) 08/14/2018  . Diabetes mellitus type 2, diet-controlled ([place]) 07/07/2018  . Primary osteoarthritis of both knees 07/07/2018  . Leg cramping 07/07/2018  . Senile purpura ([place]) 07/07/2018    Past Surgical History:  Procedure Laterality Date  . TONSILLECTOMY Bilateral    as a child    Family History  Problem Relation Age of Onset  . Cancer Mother        breast  . Cancer Father        lung  . Alzheimer's disease Father   . Varicose Veins Brother   . Breast cancer Neg Hx     Social History   Socioeconomic History  . Marital status: Widowed    Spouse name: Not on file  . Number of children: 2  . Years of education: Not on file  . Highest education level: 9th grade  Occupational History  . Occupation: unemployed  Social Needs  .  Financial resource strain: Very hard  . Food insecurity:    Worry: Often true    Inability: Often true  . Transportation needs:    Medical: No    Non-medical: No  Tobacco Use  . Smoking status: Current Every Day Smoker    Packs/day: 1.00    Years: 55.00    Pack years: 55.00    Types: Cigarettes    Start date: 07/07/1961  . Smokeless tobacco: Never Used  Substance and Sexual Activity  . Alcohol use: No  . Drug use: Never  . Sexual activity: Not Currently    Partners: Male  Lifestyle  . Physical activity:    Days per week: 7 days    Minutes per session: 10 min  . Stress: Not at all  Relationships  . Social connections:    Talks on phone: Never    Gets together: More than three times a week    Attends religious service: More than 4 times per year    Active member of club or organization: No    Attends meetings of clubs or organizations: Never    Relationship status: Widowed  . Intimate partner violence:    Fear of current or ex partner: No    Emotionally abused: No    Physically abused: No    Forced sexual activity: No  Other Topics Concern  .  Not on file  Social History Narrative   Patient moved from [place] and lives with her daughter; active smoker.  Denies any alcohol abuse.      She has 1 son, 1 daughter, 8 grandchildren, & 2 great-grandchildren.     Current Outpatient Medications:  .  ACCU-CHEK SOFTCLIX LANCETS lancets, Use as instructed, Disp: 100 each, Rfl: 12 .  Blood Glucose Monitoring Suppl (ACCU-CHEK AVIVA PLUS) w/Device KIT, Place 1 each into alternate nostrils 2 (two) times daily., Disp: 1 kit, Rfl: 0 .  glipiZIDE (GLUCOTROL XL) 5 MG 24 hr tablet, Take 1 tablet (5 mg total) by mouth daily with breakfast., Disp: 90 tablet, Rfl: 0 .  glucose blood (ACCU-CHEK AVIVA PLUS) test strip, Use as instructed, Disp: 100 each, Rfl: 12 .  metFORMIN (GLUCOPHAGE-XR) 750 MG 24 hr tablet, Take 2 tablets (1,500 mg total) by mouth daily with breakfast., Disp: 180 tablet,  Rfl: 0 .  gabapentin (NEURONTIN) 100 MG capsule, Take 1-3 capsules (100-300 mg total) by mouth at bedtime. Start with one at night and go up by one every 3 days, max of 300 mg at night (Patient not taking: Reported on 09/08/2018), Disp: 90 capsule, Rfl: 3 .  omeprazole (PRILOSEC) 40 MG capsule, Take 1 capsule (40 mg total) by mouth daily. (Patient not taking: Reported on 10/02/2018), Disp: 90 capsule, Rfl: 0 .  traMADol (ULTRAM) 50 MG tablet, Take 1 tablet (50 mg total) by mouth at bedtime. (Patient not taking: Reported on 09/08/2018), Disp: 30 tablet, Rfl: 0  No Known Allergies  I personally reviewed active problem list, medication list, allergies, family history, social history with the patient/caregiver today.   ROS   Constitutional: Negative for fever , positive for  weight change.  Respiratory: Positive  for cough and shortness of breath.   Cardiovascular: Negative for chest pain or palpitations.  Gastrointestinal: Positive for abdominal pain, no bowel changes.  Musculoskeletal: Negative for gait problem or joint swelling.  Skin: Negative for rash.  Neurological: Negative for dizziness or headac he.  No other specific complaints in a complete review of systems (except as listed in HPI above).   Objective  Vitals:   10/02/18 1127  BP: 112/68  Pulse: 88  Resp: 16  Temp: 97.8 F (36.6 C)  TempSrc: Oral  SpO2: 99%  Weight: 177 lb 8 oz (80.5 kg)  Height: '5\' 3"'  (1.6 m)    Body mass index is 31.44 kg/m.  Physical Exam  Constitutional: Patient appears well-developed and well-nourished. Obese No distress.  HEENT: head atraumatic, normocephalic, pupils equal and reactive to light, eneck supple, throat within normal limits Cardiovascular: Normal rate, regular rhythm and normal heart sounds.  No murmur heard. No BLE edema. Pulmonary/Chest: Effort normal and breath sounds normal. No respiratory distress. Abdominal: Soft.  There is diffuse abdominal tenderness, no rebound  tenderness. Psychiatric: Patient has a normal mood and affect. behavior was normal initially but got agitated when I said she has cancer   Recent Results (from the past 2160 hour(s))  CBC with Differential/Platelet     Status: None   Collection Time: 07/07/18  9:55 AM  Result Value Ref Range   WBC 4.8 3.8 - 10.8 Thousand/uL   RBC 4.79 3.80 - 5.10 Million/uL   Hemoglobin 14.5 11.7 - 15.5 g/dL   HCT 42.8 35.0 - 45.0 %   MCV 89.4 80.0 - 100.0 fL   MCH 30.3 27.0 - 33.0 pg   MCHC 33.9 32.0 - 36.0 g/dL   RDW 13.2 11.0 -  15.0 %   Platelets 177 140 - 400 Thousand/uL   MPV 10.4 7.5 - 12.5 fL   Neutro Abs 3,187 1,500 - 7,800 cells/uL   Lymphs Abs 1,104 850 - 3,900 cells/uL   WBC mixed population 370 200 - 950 cells/uL   Eosinophils Absolute 110 15 - 500 cells/uL   Basophils Absolute 29 0 - 200 cells/uL   Neutrophils Relative % 66.4 %   Total Lymphocyte 23.0 %   Monocytes Relative 7.7 %   Eosinophils Relative 2.3 %   Basophils Relative 0.6 %  COMPLETE METABOLIC PANEL WITH GFR     Status: Abnormal   Collection Time: 07/07/18  9:55 AM  Result Value Ref Range   Glucose, Bld 324 (H) 65 - 99 mg/dL    Comment: .            Fasting reference interval . For someone without known diabetes, a glucose value >125 mg/dL indicates that they may have diabetes and this should be confirmed with a follow-up test. .    BUN 17 7 - 25 mg/dL   Creat 0.92 0.60 - 0.93 mg/dL    Comment: For patients >62 years of age, the reference limit for Creatinine is approximately 13% higher for people identified as African-American. .    GFR, Est Non African American 63 > OR = 60 mL/min/1.66m   GFR, Est African American 73 > OR = 60 mL/min/1.749m  BUN/Creatinine Ratio NOT APPLICABLE 6 - 22 (calc)   Sodium 139 135 - 146 mmol/L   Potassium 4.1 3.5 - 5.3 mmol/L   Chloride 107 98 - 110 mmol/L   CO2 28 20 - 32 mmol/L   Calcium 9.2 8.6 - 10.4 mg/dL   Total Protein 5.8 (L) 6.1 - 8.1 g/dL   Albumin 3.9 3.6 - 5.1  g/dL   Globulin 1.9 1.9 - 3.7 g/dL (calc)   AG Ratio 2.1 1.0 - 2.5 (calc)   Total Bilirubin 0.6 0.2 - 1.2 mg/dL   Alkaline phosphatase (APISO) 101 33 - 130 U/L   AST 8 (L) 10 - 35 U/L   ALT 7 6 - 29 U/L  Lipid panel     Status: Abnormal   Collection Time: 07/07/18  9:55 AM  Result Value Ref Range   Cholesterol 119 <200 mg/dL   HDL 25 (L) >50 mg/dL   Triglycerides 153 (H) <150 mg/dL   LDL Cholesterol (Calc) 70 mg/dL (calc)    Comment: Reference range: <100 . Desirable range <100 mg/dL for primary prevention;   <70 mg/dL for patients with CHD or diabetic patients  with > or = 2 CHD risk factors. . Marland KitchenDL-C is now calculated using the Martin-Hopkins  calculation, which is a validated novel method providing  better accuracy than the Friedewald equation in the  estimation of LDL-C.  MaCresenciano Genret al. JAAnnamaria [id number]  (http://education.[facility].com/faq/FAQ164)    Total CHOL/HDL Ratio 4.8 <5.0 (calc)   Non-HDL Cholesterol (Calc) 94 <130 mg/dL (calc)    Comment: For patients with diabetes plus 1 major ASCVD risk  factor, treating to a non-HDL-C goal of <100 mg/dL  (LDL-C of <70 mg/dL) is considered a therapeutic  option.   Hemoglobin A1c     Status: Abnormal   Collection Time: 07/07/18  9:55 AM  Result Value Ref Range   Hgb A1c MFr Bld 9.0 (H) <5.7 % of total Hgb    Comment: For someone without known diabetes, a hemoglobin A1c value of 6.5% or greater indicates that they  may have  diabetes and this should be confirmed with a follow-up  test. . For someone with known diabetes, a value <7% indicates  that their diabetes is well controlled and a value  greater than or equal to 7% indicates suboptimal  control. A1c targets should be individualized based on  duration of diabetes, age, comorbid conditions, and  other considerations. . Currently, no consensus exists regarding use of hemoglobin A1c for diagnosis of diabetes for children. .    Mean Plasma  Glucose 212 (calc)   eAG (mmol/L) 11.7 (calc)  Hepatitis C Antibody     Status: None   Collection Time: 07/07/18  9:55 AM  Result Value Ref Range   Hepatitis C Ab NON-REACTIVE NON-REACTI   SIGNAL TO CUT-OFF 0.01 <1.00    Comment: . HCV antibody was non-reactive. There is no laboratory  evidence of HCV infection. . In most cases, no further action is required. However, if recent HCV exposure is suspected, a test for HCV RNA (test code [phone]) is suggested. . For additional information please refer to http://education.[facility].com/faq/FAQ22v1 (This link is being provided for informational/ educational purposes only.) .   Microalbumin / creatinine urine ratio     Status: None   Collection Time: 07/07/18  9:55 AM  Result Value Ref Range   Creatinine, Urine 94 20 - 275 mg/dL   Microalb, Ur 0.7 mg/dL    Comment: Reference Range Not established    Microalb Creat Ratio 7 <30 mcg/mg creat    Comment: . The ADA defines abnormalities in albumin excretion as follows: Marland Kitchen Category         Result (mcg/mg creatinine) . Normal                    <30 Microalbuminuria         30-299  Clinical albuminuria   > OR = 300 . The ADA recommends that at least two of three specimens collected within a 3-6 month period be abnormal before considering a patient to be within a diagnostic category.   Magnesium     Status: None   Collection Time: 07/07/18  9:55 AM  Result Value Ref Range   Magnesium 1.7 1.5 - 2.5 mg/dL  Creatinine     Status: None   Collection Time: 08/16/18  2:45 PM  Result Value Ref Range   Creatinine, Ser 0.86 0.40 - 1.20 mg/dL  BUN     Status: None   Collection Time: 08/16/18  2:45 PM  Result Value Ref Range   BUN 16 6 - 23 mg/dL  APTT upon arrival     Status: None   Collection Time: 08/22/18  9:04 AM  Result Value Ref Range   aPTT 25 24 - 36 seconds    Comment: Performed at [facility], [address] [place], [place] [address]  CBC upon arrival      Status: None   Collection Time: 08/22/18  9:04 AM  Result Value Ref Range   WBC 4.9 4.0 - 10.5 K/uL   RBC 4.85 3.87 - 5.11 MIL/uL   Hemoglobin 14.4 12.0 - 15.0 g/dL   HCT 42.8 36.0 - 46.0 %   MCV 88.2 80.0 - 100.0 fL   MCH 29.7 26.0 - 34.0 pg   MCHC 33.6 30.0 - 36.0 g/dL   RDW 13.2 11.5 - 15.5 %   Platelets 163 150 - 400 K/uL   nRBC 0.0 0.0 - 0.2 %    Comment: Performed at [facility],  [place], [place] [address]  Protime-INR upon arrival     Status: None   Collection Time: 08/22/18  9:04 AM  Result Value Ref Range   Prothrombin Time 13.4 11.4 - 15.2 seconds   INR 1.03     Comment: Performed at [facility], [address] [place], [place] [address]  Glucose, capillary     Status: Abnormal   Collection Time: 08/22/18  9:21 AM  Result Value Ref Range   Glucose-Capillary 225 (H) 70 - 99 mg/dL  Surgical pathology     Status: None   Collection Time: 08/22/18 11:07 AM  Result Value Ref Range   SURGICAL PATHOLOGY      Surgical Pathology CASE: [id number] PATIENT: Valecia Dicostanzo Surgical Pathology Report     SPECIMEN SUBMITTED: A. Lymph node, left inguinal  CLINICAL HISTORY: 72 year old female with reported history of lymphoma found to have non-specific mildly prominent left inguinal lymph node measuring approximately 3.8 x 0.9 x 1.9 cm on venous U/S of left lower extremity  PRE-OPERATIVE DIAGNOSIS: Mild left inguinal lymphadenopathy  POST-OPERATIVE DIAGNOSIS: Same as above     DIAGNOSIS: A. LYMPH NODE, LEFT INGUINAL; ULTRASOUND-GUIDED BIOPSY: - NO MORPHOLOGIC OR IMMUNOPHENOTYPIC EVIDENCE OF LYMPHOMA. - NEGATIVE FOR CARCINOMA AND GRANULOMATOUS INFLAMMATION. - SEE COMMENT.  Comment: Sections demonstrate three delicate cores of lymph node parenchyma with areas of crush artifact. A portion of the specimen was submitted to [facility] for Molecular Biology and Pathology for flow cytometric analysis.  Per report an  immunophenotypic abnormality was not identified. Over all cell viability was less than optimal (54%). If these findings do not correlate with the level of clinical suspicion for a neoplastic process an excisional biopsy may be warranted.  GROSS DESCRIPTION: A. Labeled: Left inguinal lymph node Received: Fresh for lymphoma protocol Tissue fragment(s): 4 Size: Ranging from 0.4 to 1.3 cm in length and 0.1 cm in diameter Description: Needle core biopsy fragments of tan soft tissue.  A touch prep with a Diff-Quik stain is performed.  Representative section is submitted for flow cytometry. The remainder is entirely submitted for routine histology in cassettes 1-3.    Final Diagnosis performed by Quay Burow, MD.   Electronically signed 08/24/2018 2:39:11PM The electronic signature indicates that the named Attending Pathologist has evaluated the specimen  Technical component performed at [facility], [address], [place], [place] [address] Lab: [phone] Dir: Rush Farmer, MD, MMM  Professional component performed at [place], [facility], [place], [place], [place] [address] Lab: [phone] Dir: Dellia Nims. Rubinas, MD   POC Hemoccult Bld/Stl (3-Cd Home Screen)     Status: Normal   Collection Time: 09/06/18  9:07 AM  Result Value Ref Range   Card #1 Date 08/29/2018    Fecal Occult Blood, POC Negative Negative   Card #2 Date 08/30/2018    Card #2 Fecal Occult Blod, POC Negative    Card #3 Date 08/31/2018    Card #3 Fecal Occult Blood, POC Negative   CBC with Differential/Platelet     Status: None   Collection Time: 09/08/18  4:07 PM  Result Value Ref Range   WBC 5.2 4.0 - 10.5 K/uL   RBC 4.74 3.87 - 5.11 MIL/uL   Hemoglobin 13.9 12.0 - 15.0 g/dL   HCT 42.2 36.0 - 46.0 %   MCV 89.0 80.0 - 100.0 fL   MCH 29.3 26.0 - 34.0 pg   MCHC 32.9 30.0 - 36.0 g/dL   RDW 13.2 11.5 -  15.5 %   Platelets 190 150 - 400 K/uL   nRBC 0.0 0.0 - 0.2 %   Neutrophils  Relative % 69 %   Neutro Abs 3.6 1.7 - 7.7 K/uL   Lymphocytes Relative 23 %   Lymphs Abs 1.2 0.7 - 4.0 K/uL   Monocytes Relative 6 %   Monocytes Absolute 0.3 0.1 - 1.0 K/uL   Eosinophils Relative 1 %   Eosinophils Absolute 0.1 0.0 - 0.5 K/uL   Basophils Relative 1 %   Basophils Absolute 0.0 0.0 - 0.1 K/uL   Immature Granulocytes 0 %   Abs Immature Granulocytes 0.02 0.00 - 0.07 K/uL    Comment: Performed at [facility], [address] [place], [place] [address]  Lactate dehydrogenase     Status: None   Collection Time: 09/08/18  4:07 PM  Result Value Ref Range   LDH 157 98 - 192 U/L    Comment: Performed at [facility], [address] [place], [place] [address]  Comprehensive metabolic panel     Status: Abnormal   Collection Time: 09/08/18  4:07 PM  Result Value Ref Range   Sodium 142 135 - 145 mmol/L   Potassium 3.7 3.5 - 5.1 mmol/L   Chloride 105 98 - 111 mmol/L   CO2 26 22 - 32 mmol/L   Glucose, Bld 100 (H) 70 - 99 mg/dL   BUN 18 8 - 23 mg/dL   Creatinine, Ser 0.85 0.44 - 1.00 mg/dL   Calcium 9.0 8.9 - 10.3 mg/dL   Total Protein 6.7 6.5 - 8.1 g/dL   Albumin 4.2 3.5 - 5.0 g/dL   AST 16 15 - 41 U/L   ALT 12 0 - 44 U/L   Alkaline Phosphatase 82 38 - 126 U/L   Total Bilirubin 0.8 0.3 - 1.2 mg/dL   GFR calc non Af Amer >60 >60 mL/min   GFR calc Af Amer >60 >60 mL/min    Comment: (NOTE) The eGFR has been calculated using the CKD EPI equation. This calculation has not been validated in all clinical situations. eGFR's persistently <60 mL/min signify possible Chronic Kidney Disease.    Anion gap 11 5 - 15    Comment: Performed at [facility], [address] [place], [place] [address]  Hepatitis B core antibody, IgM     Status: None   Collection Time: 09/08/18  4:07 PM  Result Value Ref Range   Hep B C IgM Negative Negative    Comment: (NOTE) Performed At: [facility] [place], [place] [id number] Rush Farmer MD  PJ:[id number]   Hepatitis B surface antigen     Status: None   Collection Time: 09/08/18  4:07 PM  Result Value Ref Range   Hepatitis B Surface Ag Negative Negative    Comment: (NOTE) Performed At: [facility] [place], [place] [id number] Rush Farmer MD NO:[id number]   Multiple Myeloma Panel (SPEP&IFE w/QIG)     Status: Abnormal   Collection Time: 09/08/18  4:07 PM  Result Value Ref Range   IgG (Immunoglobin G), Serum 598 (L) 700 - 1,600 mg/dL   IgA 36 (L) 87 - 352 mg/dL    Comment: Result confirmed on concentration.   IgM (Immunoglobulin M), Srm 28 26 - 217 mg/dL   Total Protein ELP 6.1 6.0 - 8.5 g/dL   Albumin SerPl Elph-Mcnc 3.9 2.9 - 4.4 g/dL   Alpha 1 0.2 0.0 - 0.4 g/dL   Alpha2 Glob SerPl Elph-Mcnc  0.6 0.4 - 1.0 g/dL   B-Globulin SerPl Elph-Mcnc 0.8 0.7 - 1.3 g/dL   Gamma Glob SerPl Elph-Mcnc 0.5 0.4 - 1.8 g/dL   M Protein SerPl Elph-Mcnc Not Observed Not Observed g/dL   Globulin, Total 2.2 2.2 - 3.9 g/dL   Albumin/Glob SerPl 1.8 (H) 0.7 - 1.7   IFE 1 Comment     Comment: An apparent normal immunofixation pattern.   Please Note Comment     Comment: (NOTE) Protein electrophoresis scan will follow via computer, mail, or courier delivery. Performed At: [facility] [place], [place] [id number] Rush Farmer MD FY:[id number]   Glucose, capillary     Status: Abnormal   Collection Time: 09/29/18 11:23 AM  Result Value Ref Range   Glucose-Capillary 125 (H) 70 - 99 mg/dL      PHQ2/9: Depression screen [facility] 2/9 09/06/2018 08/14/2018 07/07/2018  Decreased Interest 0 0 0  Down, Depressed, Hopeless 0 0 0  PHQ - 2 Score 0 0 0  Altered sleeping 3 - 3  Tired, decreased energy 0 - 3  Change in appetite 0 - 0  Feeling bad or failure about yourself  0 - 0  Trouble concentrating 0 - 0  Moving slowly or fidgety/restless 1 - 0  Suicidal thoughts 0 - 0  PHQ-9 Score 4 - 6  Difficult doing work/chores Not difficult at all - Not  difficult at all     Fall Risk: Fall Risk  09/06/2018 08/14/2018 07/07/2018  Falls in the past year? 1 Yes Yes  Number falls in past yr: 1 2 or more 2 or more  Injury with Fall? 1 Yes Yes  Risk Factor Category  - High Fall Risk High Fall Risk  Risk for fall due to : History of fall(s);Impaired balance/gait;Impaired mobility History of fall(s) Impaired balance/gait;Impaired mobility  Follow up - Falls evaluation completed Falls prevention discussed;Education provided;Falls evaluation completed     Assessment & Plan  1. 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 Generalized abdominal pain  It may be from Lymphoma, oncologist recommended follow up with GI  3. Pain in both lower extremities  - Ambulatory referral to Vascular Surgery - per oncologist recommendation

## 2018-10-02 NOTE — Addendum Note (Signed)
Addended by: Inda Coke on: 10/02/2018 12:38 PM   Modules accepted: Orders

## 2018-10-10 ENCOUNTER — Telehealth: Payer: Self-pay | Admitting: Internal Medicine

## 2018-10-10 NOTE — Telephone Encounter (Signed)
1610-Patient returned phone call to Shirlean Mylar in cancer center reception from phone number [phone]. She told the receptionist. "I don't know why Dr. B is calling me when I don't have cancer."  Dr. B called and left a phone number again at [phone]. MD left vm requesting pt to return a phone call and to come in for an apt to see him to discuss test results.

## 2018-10-10 NOTE — Telephone Encounter (Signed)
And myself called all the numbers listed in the contacts [[phone]-left messages to call us back].   The [phone]- " wrong number".  Heather/Brooke-please try to reach the patient to follow-up with me asap to discuss results of the PET scan.  FYI-Dr.Sowles.   Thanks Dr.B

## 2018-10-10 NOTE — Telephone Encounter (Signed)
1607- Left vm for patient at [phone] requesting patient to return our phone call to discuss test results.

## 2018-10-11 ENCOUNTER — Other Ambulatory Visit: Payer: Self-pay | Admitting: Family Medicine

## 2018-10-11 NOTE — Telephone Encounter (Signed)
Tramadol is not even on her active medication list, not sure if she stopped on her own. We placed referral to vascular surgeon  I don't mind giving one refill since she was here recently

## 2018-10-11 NOTE — Telephone Encounter (Signed)
Copied from [place] [phone]. Topic: Quick Communication - Rx Refill/Question >> Oct 11, 2018  1:46 PM Windy Kalata wrote: Medication: traMADol (ULTRAM) 50 MG tablet   Has the patient contacted their pharmacy? Yes.   (Agent: If no, request that the patient contact the pharmacy for the refill.) (Agent: If yes, when and what did the pharmacy advise?)   Needs refill  Preferred Pharmacy (with phone number or street name):   Agent: Please be advised that RX refills may take up to 3 business days. We ask that you follow-up with your pharmacy.

## 2018-10-11 NOTE — Telephone Encounter (Signed)
Doesn't she need an appointment for Tramadol.

## 2018-10-12 MED ORDER — TRAMADOL HCL 50 MG PO TABS: 50.0000 mg | ORAL_TABLET | Freq: Three times a day (TID) | ORAL | 0 refills | Status: DC | PRN

## 2018-10-12 NOTE — Telephone Encounter (Signed)
1431-I left a vm for the patient [phone]. The msg on the phone stated that the "textmail prescriber is not available please let a vm." VM - requesting patient "to return our phone call as Dr. B would like you to make an apt in the clinic to discuss test results."

## 2018-10-12 NOTE — Addendum Note (Signed)
Addended by: Chilton Greathouse on: 10/12/2018 02:46 PM   Modules accepted: Orders

## 2018-10-12 NOTE — Telephone Encounter (Signed)
FYI - Dr. Ancil Boozer, we have been trying to reach this patient to discuss her most recent pet scan results and have not been successful. The results are abnormal!

## 2018-10-12 NOTE — Telephone Encounter (Signed)
Tired to contact her to see if she has a schedule appointment with Vascular. Unable to reach her or leave vm. Looks like they have tired to call her and schedule appointment.

## 2018-10-16 ENCOUNTER — Encounter: Payer: Self-pay | Admitting: *Deleted

## 2018-10-16 NOTE — Telephone Encounter (Signed)
Certified letter mailed to patient.  ° °

## 2018-10-16 NOTE — Telephone Encounter (Signed)
md would like to send certified letter to patient stressing the importance of follow-up for test results.

## 2018-10-23 ENCOUNTER — Telehealth: Payer: Self-pay | Admitting: *Deleted

## 2018-10-23 ENCOUNTER — Telehealth: Payer: Self-pay | Admitting: Internal Medicine

## 2018-10-23 ENCOUNTER — Ambulatory Visit: Payer: Medicare HMO | Admitting: Podiatry

## 2018-10-23 NOTE — Telephone Encounter (Signed)
Patient called stating she got a registered letter and she wants to know her results. She left number [phone] as her contact number

## 2018-10-23 NOTE — Telephone Encounter (Signed)
Finally spoke to the patient regarding the results of the PET scan and the concerns for lymphoma.  Finally agrees to follow-up on January 2/Thursday; will discuss biopsy.

## 2018-10-23 NOTE — Telephone Encounter (Signed)
I called left message x3 phones- to call us back to discuss the results of the PET scan.

## 2018-10-23 NOTE — Telephone Encounter (Signed)
Pt returned phone call. Discussed briefly results with Dr. Rogue Bussing. Apt given on 10/26/18 at 11 am to further discuss test results

## 2018-10-26 ENCOUNTER — Other Ambulatory Visit: Payer: Medicare HMO

## 2018-10-26 ENCOUNTER — Inpatient Hospital Stay: Payer: Medicare HMO | Admitting: Internal Medicine

## 2018-10-26 ENCOUNTER — Telehealth: Payer: Self-pay | Admitting: *Deleted

## 2018-10-26 NOTE — Progress Notes (Unsigned)
[facility] CONSULT NOTE  Patient Care Team: Steele Sizer, [name], [name]; declines to give further information; [name]; [name]; 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  female has been referred to [place] for further evaluation of lymphadenopathy/diagnosis of lymphoma.  Patient states that she was diagnosed with "lymphoma" of the abdomen in [place] approximately 2 years ago.  However, states that after "divine intervention"-lymphoma disappeared/she was told to be cured.  She did not receive any chemotherapy or radiation therapy.  She is adamant; and does not want to talk about it any further.  Patient complains of abdominal pain for the last many months.  Denies any night sweats.  Denies any weight loss.  No fevers.  Patient complains of soreness in the legs.  Chronic mild cough.  Complains of fatigue/malaise.  Complains of easy bruising.  Review of Systems  Constitutional: Positive for malaise/fatigue. Negative for chills, diaphoresis, fever and weight loss.  HENT: Negative for nosebleeds and sore throat.   Eyes: Negative for double vision.  Respiratory: Positive for cough. Negative for hemoptysis, sputum production, shortness of breath and wheezing.   Cardiovascular: Negative for chest pain, palpitations, orthopnea and leg swelling.  Gastrointestinal: Positive for  abdominal pain. Negative for blood in stool, constipation, diarrhea, heartburn, melena, nausea and vomiting.  Genitourinary: Negative for dysuria, frequency and urgency.  Musculoskeletal: Positive for myalgias. Negative for back pain and joint pain.  Skin: Negative.  Negative for itching and rash.  Neurological: Negative for dizziness, tingling, focal weakness, weakness and headaches.  Endo/Heme/Allergies: Bruises/bleeds easily.  Psychiatric/Behavioral: Negative for depression. The patient is not nervous/anxious and does not have insomnia.      MEDICAL HISTORY:  Past Medical History:  Diagnosis Date  . Cancer [facility])    lymphoma-stomach   . 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 Diabetes mellitus without complication ([place])     SURGICAL HISTORY: Past Surgical History:  Procedure Laterality Date  . TONSILLECTOMY Bilateral    as a child    SOCIAL HISTORY: Social History   Socioeconomic History  . Marital status: Widowed    Spouse name: Not on file  . Number of children: 2  . Years of education: Not on file  . Highest education level: 9th grade  Occupational History  . Occupation: unemployed  Social Needs  . Financial resource strain: Very hard  . Food insecurity:    Worry: Often true    Inability: Often true  . Transportation needs:    Medical: No    Non-medical: No  Tobacco Use  . Smoking status: Current Every Day Smoker    Packs/day: 1.00    Years: 55.00    Pack years: 55.00    Types: Cigarettes    Start date: 07/07/1961  . Smokeless tobacco: Never Used  Substance and Sexual Activity  . Alcohol use: No  . Drug use: Never  . Sexual activity: Not Currently  Partners: Male  Lifestyle  . Physical activity:    Days per week: 7 days    Minutes per session: 10 min  . Stress: Not at all  Relationships  . Social connections:    Talks on phone: Never    Gets together: More than three times a week    Attends religious service: More than 4 times per year    Active member of  club or organization: No    Attends meetings of clubs or organizations: Never    Relationship status: Widowed  . Intimate partner violence:    Fear of current or ex partner: No    Emotionally abused: No    Physically abused: No    Forced sexual activity: No  Other Topics Concern  . Not on file  Social History Narrative   Patient moved from [place] and lives with her daughter; active smoker.  Denies any alcohol abuse.      She has 1 son, 1 daughter, 8 grandchildren, & 2 great-grandchildren.    FAMILY HISTORY: Family History  Problem Relation Age of Onset  . Cancer Mother        breast  . Cancer Father        lung  . Alzheimer's disease Father   . Varicose Veins Brother   . Breast cancer Neg Hx     ALLERGIES:  has No Known Allergies.  MEDICATIONS:  Current Outpatient Medications  Medication Sig Dispense Refill  . ACCU-CHEK SOFTCLIX LANCETS lancets Use as instructed 100 each 12  . Blood Glucose Monitoring Suppl (ACCU-CHEK AVIVA PLUS) w/Device KIT Place 1 each into alternate nostrils 2 (two) times daily. 1 kit 0  . glipiZIDE (GLUCOTROL XL) 5 MG 24 hr tablet Take 1 tablet (5 mg total) by mouth daily with breakfast. 90 tablet 0  . glucose blood (ACCU-CHEK AVIVA PLUS) test strip Use as instructed 100 each 12  . metFORMIN (GLUCOPHAGE-XR) 750 MG 24 hr tablet Take 2 tablets (1,500 mg total) by mouth daily with breakfast. 180 tablet 0  . omeprazole (PRILOSEC) 40 MG capsule Take 1 capsule (40 mg total) by mouth daily. (Patient not taking: Reported on 10/02/2018) 90 capsule 0  . traMADol (ULTRAM) 50 MG tablet Take 1 tablet (50 mg total) by mouth every 8 (eight) hours as needed. 30 tablet 0   No current facility-administered medications for this visit.       Marland Kitchen  PHYSICAL EXAMINATION: ECOG PERFORMANCE STATUS: 1 - Symptomatic but completely ambulatory  There were no vitals filed for this visit. There were no vitals filed for this visit.  Physical Exam  Constitutional: She is  oriented to person, place, and time and well-developed, well-nourished, and in no distress.  HENT:  Head: Normocephalic and atraumatic.  Mouth/Throat: Oropharynx is clear and moist. No oropharyngeal exudate.  Eyes: Pupils are equal, round, and reactive to light.  Neck: Normal range of motion. Neck supple.  Cardiovascular: Normal rate and regular rhythm.  Pulmonary/Chest: No respiratory distress. She has no wheezes.  Abdominal: Soft. Bowel sounds are normal. She exhibits no distension and no mass. There is no abdominal tenderness. There is no rebound and no guarding.  Musculoskeletal: Normal range of motion.        General: Tenderness present. No edema.  Neurological: She is alert and oriented to person, place, and time.  Skin: Skin is warm.  Psychiatric: Affect normal.     LABORATORY DATA:  I have reviewed the data as listed Lab Results  Component Value Date  WBC 5.2 09/08/2018   HGB 13.9 09/08/2018   HCT 42.2 09/08/2018   MCV 89.0 09/08/2018   PLT 190 09/08/2018   Recent Labs    06/11/18 1308 07/07/18 0955 08/16/18 1445 09/08/18 1607  NA 139 139  --  142  K 4.3 4.1  --  3.7  CL 105 107  --  105  CO2 29 28  --  26  GLUCOSE 260* 324*  --  100*  BUN '13 17 16 18  '$ CREATININE 0.77 0.92 0.86 0.85  CALCIUM 8.6* 9.2  --  9.0  GFRNONAA >60 63  --  >60  GFRAA >60 73  --  >60  PROT  --  5.8*  --  6.7  ALBUMIN  --   --   --  4.2  AST  --  8*  --  16  ALT  --  7  --  12  ALKPHOS  --   --   --  82  BILITOT  --  0.6  --  0.8    RADIOGRAPHIC STUDIES: I have personally reviewed the radiological images as listed and agreed with the findings in the report. Nm Pet Image Initial (pi) Skull Base To Thigh  Result Date: 09/29/2018 CLINICAL DATA:  Initial treatment strategy for low-grade lymphoma with generalized lymphadenopathy. Biopsy 2-3 weeks ago in leg. EXAM: NUCLEAR MEDICINE PET SKULL BASE TO THIGH TECHNIQUE: 9.1 mCi F-18 FDG was injected intravenously. Full-ring PET imaging was  performed from the skull base to thigh after the radiotracer. CT data was obtained and used for attenuation correction and anatomic localization. Fasting blood glucose: 125 mg/dl COMPARISON:  Abdominopelvic CT 08/29/2018 FINDINGS: Mediastinal blood pool activity: SUV max 2.1 NECK: Left tongue base/palatine tonsil hypermetabolism corresponds to probable soft tissue fullness including on image 39/3. This measures a S.U.V. max of 5.0. Hypermetabolism about the right facial musculature superficial to the angle of the mandible is likely due to muscular activity after radiopharmaceutical injection. Adenopathy in the low left jugular region and supraclavicular station, including at 10 mm and a S.U.V. max of 4.2 on image 65/3. Incidental CT findings: none CHEST: Bilateral hypermetabolic axillary nodes. Index left axillary node measures 1.3 cm and a S.U.V. max of 5.2 on 76/3. A periesophageal node measures 9 mm and a S.U.V. max of 5.3 on 96/3. Incidental CT findings: Tiny hiatal hernia. Normal heart size. A right upper lobe well-circumscribed non hypermetabolic pulmonary nodule of 1.5 cm has speckled calcification and possible fat density within on image 90/3. ABDOMEN/PELVIS: Hypermetabolism of the spleen relative to the liver with splenomegaly as detailed on prior CT. This measures a S.U.V. max of 4.7. Hypermetabolic abdominal nodes, including a portal caval node which measures 1.9 cm and a S.U.V. max of 6.9 on 147/3. Pelvic sidewall adenopathy, with an index right external iliac node measuring 2.1 cm and a S.U.V. max of 11.0 on 223/3. There is mild misregistration between CT and PET images, especially within the pelvis. A focus of hypermetabolism which projects posterior to the urinary bladder is therefore indeterminate, measuring a S.U.V. max of 9.5 and in the region of the anus and perineum including on image 249/3. Incidental CT findings: Abdominal aortic atherosclerosis. Gallstones. Pelvic floor laxity. SKELETON:  Multifocal osseous hypermetabolism. Example in the right humeral head at a S.U.V. max of 5.0 without CT correlate on image 54/3. Within the T5 vertebral body at a S.U.V. max of 7.0. Incidental CT findings: Remote left rib fractures. IMPRESSION: 1. Active lymphoma involving the cervicothoracic junction, chest,  abdomen, pelvis, and marrow, as detailed above. Suspicion of left tongue base/palatine tonsil involvement as evidenced by hypermetabolism and subtle soft tissue fullness. 2. Hypermetabolism within the low pelvis, difficult to localize secondary to misregistration between CT and PET images. Consider physical exam correlation, including of the perineum and anus. Presuming the physical exam is normal, this could simply represent urinary contamination about the perineum. 3. Non hypermetabolic right upper lobe pulmonary nodule, favored to represent a benign lesion such as a hamartoma. Recommend attention on follow-up. 4.  Incidental findings, including cholelithiasis. Electronically Signed   By: Abigail Miyamoto M.D.   On: 09/29/2018 16:50    ASSESSMENT & PLAN:   No problem-specific Assessment & Plan notes found for this encounter.    All questions were answered. The patient knows to call the clinic with any problems, questions or concerns.       Cammie Sickle, MD 10/26/2018 8:24 AM

## 2018-10-26 NOTE — Assessment & Plan Note (Signed)
#  As per patient report suspicious for" low-grade lymphoma".  CT scan abdomen pelvis suggestive of up to 2 cm retroperitoneal adenopathy/periportal/peripancreatic/pelvic adenopathy.  However left inguinal lymph node core biopsy negative for malignancy.   #I  Had a long discussion with the patient regarding the possibility of lymphoma; especially given her prior history.  However patient is adamant that she does not want to talk about the previous diagnosis.  Unfortunately patient seems to have poor insight.  Offered to talk to her daughter patient declined.  #Plan to get labs including CBC CMP LDH hepatitis panel; and also PET scan.  Patient will need repeat biopsy/possible excisional biopsy to confirm diagnosis of lymphoma.  # Smoker-discussed regarding smoking cessation.  Patient is not interested.  # Abdominal pain-chronic.  Question related to above possible diagnosis of lymphoma.  Await GI evaluation as planned.  #Bilateral lower extremity tenderness-unclear etiology.  Recent lower extremity ultrasound negative for any DVT.  Question related to smoking.  Recommend vascular referral/defer to PCP.  Thank you Dr.Sowles for allowing me to participate in the care of your pleasant patient. Please do not hesitate to contact me with questions or concerns in the interim.  # 60 minutes face-to-face with the patient discussing the above plan of care; more than 50% of time spent on prognosis/ natural history; counseling and coordination.   # DISPOSITION: # labs today; PET 3 weeks- Dr.B

## 2018-10-26 NOTE — Progress Notes (Signed)
Tumor Board Documentation  Natasha [name] was presented by Dr Rogue Bussing at our Tumor Board on 10/26/2018, which included representatives from medical oncology, radiation oncology, surgical oncology, surgical, radiology, pathology, navigation, research, pulmonology.  Natasha [name] currently presents as a current patient, for discussion with history of the following treatments: active survellience.  Additionally, we reviewed previous medical and familial history, history of present illness, and recent lab results along with all available histopathologic and imaging studies. The tumor board considered available treatment options and made the following recommendations: Biopsy Axillary bx either CT Core vs surgical lymph node excision  The following procedures/referrals were also placed: No orders of the defined types were placed in this encounter.   Clinical Trial Status: not discussed   Staging used:    National site-specific guidelines NCCN were discussed with respect to the case.  Tumor board is a meeting of clinicians from various specialty areas who evaluate and discuss patients for whom a multidisciplinary approach is being considered. Final determinations in the plan of care are those of the provider(s). The responsibility for follow up of recommendations given during tumor board is that of the provider.   Today's extended care, comprehensive team conference, Natasha [name] was not present for the discussion and was not examined.   Multidisciplinary Tumor Board is a multidisciplinary case peer review process.  Decisions discussed in the Multidisciplinary Tumor Board reflect the opinions of the specialists present at the conference without having examined the patient.  Ultimately, treatment and diagnostic decisions rest with the primary provider(s) and the patient.

## 2018-10-26 NOTE — Telephone Encounter (Signed)
Left vm x 2 - [phone] and [phone]- requesting call back as soon as possible to r/s her apt today. Patient called and left vm on scheduling line at 10 am this morning to cnl her apts with Dr. Jacinto Reap.

## 2018-10-27 NOTE — Telephone Encounter (Signed)
Spoke with patient. She is willing to r/s her apts with Dr. Rogue Bussing next Thursday - 11/02/18 at 845 am. Apt given to patient.

## 2018-10-31 ENCOUNTER — Telehealth: Payer: Self-pay | Admitting: Gastroenterology

## 2018-10-31 NOTE — Telephone Encounter (Signed)
Please schedule pt to see Dr. Loletha Carrow tomorrow or Thursday, he has appts.

## 2018-10-31 NOTE — Telephone Encounter (Signed)
Patient wants to be seen asap for abd pain and stomach pain, vomiting and nausea.

## 2018-11-02 ENCOUNTER — Inpatient Hospital Stay: Payer: Medicare HMO | Attending: Internal Medicine | Admitting: Internal Medicine

## 2018-11-02 ENCOUNTER — Encounter: Payer: Self-pay | Admitting: Internal Medicine

## 2018-11-02 VITALS — BP 131/79 | HR 73 | Temp 98.1°F | Resp 16 | Wt 177.0 lb

## 2018-11-02 DIAGNOSIS — Z9119 Patient's noncompliance with other medical treatment and regimen: Secondary | ICD-10-CM | POA: Insufficient documentation

## 2018-11-02 DIAGNOSIS — R591 Generalized enlarged lymph nodes: Secondary | ICD-10-CM | POA: Insufficient documentation

## 2018-11-02 DIAGNOSIS — M791 Myalgia, unspecified site: Secondary | ICD-10-CM | POA: Insufficient documentation

## 2018-11-02 DIAGNOSIS — E119 Type 2 diabetes mellitus without complications: Secondary | ICD-10-CM | POA: Diagnosis not present

## 2018-11-02 DIAGNOSIS — R109 Unspecified abdominal pain: Secondary | ICD-10-CM | POA: Insufficient documentation

## 2018-11-02 DIAGNOSIS — C8590 Non-Hodgkin lymphoma, unspecified, unspecified site: Secondary | ICD-10-CM

## 2018-11-02 DIAGNOSIS — Z79899 Other long term (current) drug therapy: Secondary | ICD-10-CM | POA: Insufficient documentation

## 2018-11-02 DIAGNOSIS — F17210 Nicotine dependence, cigarettes, uncomplicated: Secondary | ICD-10-CM | POA: Insufficient documentation

## 2018-11-02 DIAGNOSIS — R6889 Other general symptoms and signs: Secondary | ICD-10-CM | POA: Diagnosis not present

## 2018-11-02 DIAGNOSIS — R6881 Early satiety: Secondary | ICD-10-CM | POA: Insufficient documentation

## 2018-11-02 DIAGNOSIS — Z7984 Long term (current) use of oral hypoglycemic drugs: Secondary | ICD-10-CM | POA: Insufficient documentation

## 2018-11-02 DIAGNOSIS — R5383 Other fatigue: Secondary | ICD-10-CM | POA: Diagnosis not present

## 2018-11-02 NOTE — Assessment & Plan Note (Addendum)
#   As per patient report suspicious for" low-grade lymphoma".  Patient is quite symptomatic with poor appetite early satiety abdominal pain etc.   November 2019 CT scan abdomen pelvis suggestive of up to 2 cm retroperitoneal adenopathy/periportal/peripancreatic/pelvic adenopathy.    #Inguinal lymph node biopsy negative/interestingly no significant uptake noted on the PET scan.  #PET scan December 2019-lymphadenopathy above and below the diaphragm; splenic enlargement; lesions of the bone-highly concerning for involvement by lymphoma versus others.  I recommend a biopsy of the axillary lymph node.  This was discussed at the tumor conference.  #I had a long discussion the patient regarding the above results of the PET scan/CAT scan.  Patient continues to decline any further work-up including biopsy.  Discussed the treatment options of lymphoma could include " antibody therapies"/pills"-again based upon her histology etc.  #Unfortunately patient continues to have poor insight-she states " Reita Cliche is on my side".  She also states " hospitals kill people"  #Patient has missed multiple appointments/"no-shows"; significant noncompliance.  # Active smoker-  #Abdominal pain early satiety-likely secondary to above diagnosis.   # I reviewed the blood work- with the patient in detail; also reviewed the imaging independently [as summarized above]; and with the patient in detail.   #Unfortunately overall poor prognosis given patient's reluctance with proceeding with work-up/treatment plan.   # 40 minutes face-to-face with the patient discussing the above plan of care; more than 50% of time spent on prognosis/ natural history; counseling and coordination.  DISPOSITION: PET report # pt will call is interested in getting biopsy.

## 2018-11-02 NOTE — Progress Notes (Signed)
[facility] CONSULT NOTE  Patient Care Team: Steele Sizer, [name], [name]; declines to give further information; [name]; [name]; 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  female with a prior history of " lymphoma"-without any treatment-is currently here to review the results of her PET scan.  Patient unfortunately has poor insight-has missed multiple appointments/no-shows.  Finally after multiple calls/letter she finally is here to discuss options.  Complains of abdominal pain/early satiety; complains of bone pain.  She unfortunately continues to complain of significant fatigue; myalgias.  Review of Systems  Constitutional: Positive for malaise/fatigue. Negative for chills, diaphoresis, fever and weight loss.  HENT: Negative for nosebleeds and sore throat.   Eyes: Negative for double vision.  Respiratory: Positive for cough. Negative for hemoptysis, sputum production, shortness of breath and wheezing.   Cardiovascular: Negative for chest pain, palpitations, orthopnea and leg swelling.  Gastrointestinal: Positive for abdominal pain. Negative for blood in stool, constipation, diarrhea, heartburn, melena, nausea and vomiting.  Genitourinary: Negative for dysuria, frequency and urgency.  Musculoskeletal: Positive for myalgias. Negative for back pain and joint pain.   Skin: Negative.  Negative for itching and rash.  Neurological: Negative for dizziness, tingling, focal weakness, weakness and headaches.  Endo/Heme/Allergies: Bruises/bleeds easily.  Psychiatric/Behavioral: Negative for depression. The patient is not nervous/anxious and does not have insomnia.      MEDICAL HISTORY:  Past Medical History:  Diagnosis Date  . Cancer [facility])    lymphoma-stomach   . 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 Diabetes mellitus without complication ([place])     SURGICAL HISTORY: Past Surgical History:  Procedure Laterality Date  . TONSILLECTOMY Bilateral    as a child    SOCIAL HISTORY: Social History   Socioeconomic History  . Marital status: Widowed    Spouse name: Not on file  . Number of children: 2  . Years of education: Not on file  . Highest education level: 9th grade  Occupational History  . Occupation: unemployed  Social Needs  . Financial resource strain: Very hard  . Food insecurity:    Worry: Often true    Inability: Often true  . Transportation needs:    Medical: No    Non-medical: No  Tobacco Use  . Smoking status: Current Every Day Smoker    Packs/day: 1.00    Years: 55.00    Pack years: 55.00    Types: Cigarettes    Start date: 07/07/1961  . Smokeless tobacco: Never Used  Substance and Sexual Activity  . Alcohol use: No  . Drug use: Never  . Sexual activity: Not Currently    Partners: Male  Lifestyle  . Physical activity:    Days per week: 7 days    Minutes per session: 10 min  . Stress: Not at all  Relationships  . Social connections:    Talks on phone: Never    Gets together: More than three times  a week    Attends religious service: More than 4 times per year    Active member of club or organization: No    Attends meetings of clubs or organizations: Never    Relationship status: Widowed  . Intimate partner violence:    Fear of current or ex partner: No    Emotionally abused: No    Physically abused: No    Forced sexual  activity: No  Other Topics Concern  . Not on file  Social History Narrative   Patient moved from [place] and lives with her daughter; active smoker.  Denies any alcohol abuse.      She has 1 son, 1 daughter, 8 grandchildren, & 2 great-grandchildren.    FAMILY HISTORY: Family History  Problem Relation Age of Onset  . Cancer Mother        breast  . Cancer Father        lung  . Alzheimer's disease Father   . Varicose Veins Brother   . Breast cancer Neg Hx     ALLERGIES:  has No Known Allergies.  MEDICATIONS:  Current Outpatient Medications  Medication Sig Dispense Refill  . ACCU-CHEK SOFTCLIX LANCETS lancets Use as instructed 100 each 12  . Blood Glucose Monitoring Suppl (ACCU-CHEK AVIVA PLUS) w/Device KIT Place 1 each into alternate nostrils 2 (two) times daily. 1 kit 0  . glipiZIDE (GLUCOTROL XL) 5 MG 24 hr tablet Take 1 tablet (5 mg total) by mouth daily with breakfast. 90 tablet 0  . glucose blood (ACCU-CHEK AVIVA PLUS) test strip Use as instructed 100 each 12  . metFORMIN (GLUCOPHAGE-XR) 750 MG 24 hr tablet Take 2 tablets (1,500 mg total) by mouth daily with breakfast. 180 tablet 0  . omeprazole (PRILOSEC) 40 MG capsule Take 1 capsule (40 mg total) by mouth daily. 90 capsule 0  . traMADol (ULTRAM) 50 MG tablet Take 1 tablet (50 mg total) by mouth every 8 (eight) hours as needed. 30 tablet 0   No current facility-administered medications for this visit.       Marland Kitchen  PHYSICAL EXAMINATION: ECOG PERFORMANCE STATUS: 1 - Symptomatic but completely ambulatory  Vitals:   11/02/18 0854  BP: 131/79  Pulse: 73  Resp: 16  Temp: 98.1 F (36.7 C)   Filed Weights   11/02/18 0854  Weight: 177 lb (80.3 kg)    Physical Exam  Constitutional: She is oriented to person, place, and time and well-developed, well-nourished, and in no distress.  HENT:  Head: Normocephalic and atraumatic.  Mouth/Throat: Oropharynx is clear and moist. No oropharyngeal exudate.  Eyes: Pupils are equal,  round, and reactive to light.  Neck: Normal range of motion. Neck supple.  Cardiovascular: Normal rate and regular rhythm.  Pulmonary/Chest: No respiratory distress. She has no wheezes.  Decreased breath sounds bilateral.  Abdominal: Soft. Bowel sounds are normal. She exhibits no distension and no mass. There is no abdominal tenderness. There is no rebound and no guarding.  Musculoskeletal: Normal range of motion.        General: Tenderness present. No edema.  Lymphadenopathy:  Positive for axial adenopathy left more than right.  Neurological: She is alert and oriented to person, place, and time.  Skin: Skin is warm.  Psychiatric: Affect normal.     LABORATORY DATA:  I have reviewed the data as listed Lab Results  Component Value Date   WBC 5.2 09/08/2018   HGB 13.9 09/08/2018   HCT 42.2 09/08/2018   MCV 89.0 09/08/2018   PLT  190 09/08/2018   Recent Labs    06/11/18 1308 07/07/18 0955 08/16/18 1445 09/08/18 1607  NA 139 139  --  142  K 4.3 4.1  --  3.7  CL 105 107  --  105  CO2 29 28  --  26  GLUCOSE 260* 324*  --  100*  BUN '13 17 16 18  ' CREATININE 0.77 0.92 0.86 0.85  CALCIUM 8.6* 9.2  --  9.0  GFRNONAA >60 63  --  >60  GFRAA >60 73  --  >60  PROT  --  5.8*  --  6.7  ALBUMIN  --   --   --  4.2  AST  --  8*  --  16  ALT  --  7  --  12  ALKPHOS  --   --   --  82  BILITOT  --  0.6  --  0.8    RADIOGRAPHIC STUDIES: I have personally reviewed the radiological images as listed and agreed with the findings in the report. No results found.  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  Patient is quite symptomatic with poor appetite early satiety abdominal pain etc.   November 2019 CT scan abdomen pelvis suggestive of up to 2 cm retroperitoneal adenopathy/periportal/peripancreatic/pelvic adenopathy.    #Inguinal lymph node biopsy negative/interestingly no significant uptake noted on the PET  scan.  #PET scan December 2019-lymphadenopathy above and below the diaphragm; splenic enlargement; lesions of the bone-highly concerning for involvement by lymphoma versus others.  I recommend a biopsy of the axillary lymph node.  This was discussed at the tumor conference.  #I had a long discussion the patient regarding the above results of the PET scan/CAT scan.  Patient continues to decline any further work-up including biopsy.  Discussed the treatment options of lymphoma could include " antibody therapies"/pills"-again based upon her histology etc.  #Unfortunately patient continues to have poor insight-she states " Reita Cliche is on my side".  She also states " hospitals kill people"  #Patient has missed multiple appointments/"no-shows"; significant noncompliance.  # Active smoker-  #Abdominal pain early satiety-likely secondary to above diagnosis.   # I reviewed the blood work- with the patient in detail; also reviewed the imaging independently [as summarized above]; and with the patient in detail.    # 40 minutes face-to-face with the patient discussing the above plan of care; more than 50% of time spent on prognosis/ natural history; counseling and coordination.  DISPOSITION: PET report # pt will call is interested in getting biopsy.   All questions were answered. The patient knows to call the clinic with any problems, questions or concerns.    Cammie Sickle, MD 11/02/2018 3:33 PM

## 2018-11-02 NOTE — Progress Notes (Signed)
Patient is here today for a follow up on results and to discuss plan of care.  Patient c/o abdominal pain. She states that her stomach growls and she feels very hungry, but she can only take 2-3 bites of food and then feels very nauseated and bloated - and states her abdomen swells and gets very tender to touch.

## 2018-11-03 ENCOUNTER — Other Ambulatory Visit: Payer: Self-pay | Admitting: Family Medicine

## 2018-11-03 ENCOUNTER — Ambulatory Visit: Payer: Medicare HMO | Admitting: Internal Medicine

## 2018-11-03 DIAGNOSIS — R1013 Epigastric pain: Secondary | ICD-10-CM

## 2018-11-03 DIAGNOSIS — R252 Cramp and spasm: Secondary | ICD-10-CM

## 2018-11-03 DIAGNOSIS — E669 Obesity, unspecified: Secondary | ICD-10-CM

## 2018-11-03 DIAGNOSIS — E1169 Type 2 diabetes mellitus with other specified complication: Secondary | ICD-10-CM

## 2018-11-03 NOTE — Telephone Encounter (Signed)
Refill request for diabetic medication:   Glipizide 5 mg  Metformin 750 mg 24 hr tablet.  Last office visit pertaining to diabetes: 08/14/2018  Lab Results  Component Value Date   HGBA1C 9.0 (H) 07/07/2018     Refill request for general medication: Omeprazole 40 mg Requip 1 mg Tramadol 50 mg  Last office visit: 10/02/2018  Follow-ups on file. 11/14/2018

## 2018-11-14 ENCOUNTER — Ambulatory Visit: Payer: Medicare HMO | Admitting: Family Medicine

## 2018-11-16 NOTE — Progress Notes (Deleted)
[facility] Progress Note  Chief Complaint: Generalized abdominal pain  Subjective  History:  I saw this patient once in late October for generalized abdominal pain, CT scan suggested lymphoma.  It was difficult to reach the patient to give results, see documentation of that for details.  She was finally reached and given results, saw primary care and referred to oncology.  Initial oncology office note indicates this patient's ongoing denial about the diagnosis.  She also does not appear to believe that her abdominal pain is related to the lymphoma.  She was diagnosed with lymphoma years ago in [place], and believes that it was cured by a religious healer.  11/02/2018 oncology office note outlines this patient's poor insight into the diagnosis and how she declined further evaluation or treatment. She has ongoing generalized abdominal pain and nausea.  ROS: Cardiovascular:  no chest pain Respiratory: no dyspnea  The patient's Past Medical, Family and Social History were reviewed and are on file in the EMR.  Objective:  Med list reviewed  Current Outpatient Medications:  .  ACCU-CHEK SOFTCLIX LANCETS lancets, Use as instructed, Disp: 100 each, Rfl: 12 .  Blood Glucose Monitoring Suppl (ACCU-CHEK AVIVA PLUS) w/Device KIT, Place 1 each into alternate nostrils 2 (two) times daily., Disp: 1 kit, Rfl: 0 .  glipiZIDE (GLUCOTROL XL) 5 MG 24 hr tablet, TAKE ONE TABLET BY MOUTH EVERY DAY WITH BREAKFAST. (GENERIC FOR GLUCOTROL XL), Disp: 30 tablet, Rfl: 0 .  glucose blood (ACCU-CHEK AVIVA PLUS) test strip, Use as instructed, Disp: 100 each, Rfl: 12 .  metFORMIN (GLUCOPHAGE-XR) 750 MG 24 hr tablet, TAKE TWO TABLETS BY MOUTH EVERY DAY WITH BREAKFAST, Disp: 60 tablet, Rfl: 0 .  omeprazole (PRILOSEC) 40 MG capsule, TAKE ONE CAPSULE BY MOUTH EVERY DAY, Disp: 30 capsule, Rfl: 0 .  rOPINIRole (REQUIP) 1 MG tablet, TAKE ONE TABLET BY MOUTH EVERY DAY AT BEDTIME, Disp: 30 tablet, Rfl: 0 .  traMADol  (ULTRAM) 50 MG tablet, Take 1 tablet (50 mg total) by mouth every 8 (eight) hours as needed., Disp: 30 tablet, Rfl: 0   Vital signs in last 24 hrs: There were no vitals filed for this visit.  Physical Exam  ***  HEENT: sclera anicteric, oral mucosa moist without lesions  Neck: supple, no thyromegaly, JVD or lymphadenopathy  Cardiac: RRR without murmurs, S1S2 heard, no peripheral edema  Pulm: clear to auscultation bilaterally, normal RR and effort noted  Abdomen: soft, *** tenderness, with active bowel sounds. No guarding or palpable hepatosplenomegaly.  Skin; warm and dry, no jaundice or rash  Recent Labs:    Radiologic studies:  09/29/2018 PET scan IMPRESSION: 1. Active lymphoma involving the cervicothoracic junction, chest, abdomen, pelvis, and marrow, as detailed above. Suspicion of left tongue base/palatine tonsil involvement as evidenced by hypermetabolism and subtle soft tissue fullness. 2. Hypermetabolism within the low pelvis, difficult to localize secondary to misregistration between CT and PET images. Consider physical exam correlation, including of the perineum and anus. Presuming the physical exam is normal, this could simply represent urinary contamination about the perineum. 3. Non hypermetabolic right upper lobe pulmonary nodule, favored to represent a benign lesion such as a hamartoma. Recommend attention on follow-up. 4.  Incidental findings, including cholelithiasis.     Electronically Signed   By: Abigail Miyamoto M.D.   On: 09/29/2018 16:50    '@ASSESSMENTPLANBEGIN' @ Assessment: No diagnosis found.    Plan:    Total time *** minutes, over half spent face-to-face with patient in counseling  and coordination of care.   Natasha [name]

## 2018-11-17 ENCOUNTER — Ambulatory Visit: Payer: Medicare HMO | Admitting: Gastroenterology

## 2018-12-01 ENCOUNTER — Telehealth: Payer: Self-pay | Admitting: Family Medicine

## 2018-12-01 DIAGNOSIS — S32000S Wedge compression fracture of unspecified lumbar vertebra, sequela: Secondary | ICD-10-CM

## 2018-12-01 DIAGNOSIS — M79605 Pain in left leg: Principal | ICD-10-CM

## 2018-12-01 DIAGNOSIS — E1169 Type 2 diabetes mellitus with other specified complication: Secondary | ICD-10-CM

## 2018-12-01 DIAGNOSIS — E669 Obesity, unspecified: Secondary | ICD-10-CM

## 2018-12-01 DIAGNOSIS — R1013 Epigastric pain: Secondary | ICD-10-CM

## 2018-12-01 DIAGNOSIS — M79604 Pain in right leg: Secondary | ICD-10-CM

## 2018-12-01 DIAGNOSIS — R252 Cramp and spasm: Secondary | ICD-10-CM

## 2018-12-01 NOTE — Telephone Encounter (Signed)
LVM for pt to call the office to schedule an appt. °

## 2018-12-06 ENCOUNTER — Ambulatory Visit: Payer: Medicare HMO | Admitting: Gastroenterology

## 2019-08-27 ENCOUNTER — Emergency Department
Admission: EM | Admit: 2019-08-27 | Discharge: 2019-08-27 | Disposition: A | Discharge status: Home / Self Care | Payer: Medicare Other | Attending: Emergency Medicine | Admitting: Emergency Medicine

## 2019-08-27 ENCOUNTER — Other Ambulatory Visit: Payer: Self-pay

## 2019-08-27 ENCOUNTER — Encounter: Payer: Self-pay | Admitting: Emergency Medicine

## 2019-08-27 DIAGNOSIS — R21 Rash and other nonspecific skin eruption: Secondary | ICD-10-CM | POA: Diagnosis present

## 2019-08-27 DIAGNOSIS — Z79899 Other long term (current) drug therapy: Secondary | ICD-10-CM | POA: Diagnosis not present

## 2019-08-27 DIAGNOSIS — B029 Zoster without complications: Secondary | ICD-10-CM | POA: Insufficient documentation

## 2019-08-27 DIAGNOSIS — E119 Type 2 diabetes mellitus without complications: Secondary | ICD-10-CM | POA: Insufficient documentation

## 2019-08-27 DIAGNOSIS — F17210 Nicotine dependence, cigarettes, uncomplicated: Secondary | ICD-10-CM | POA: Insufficient documentation

## 2019-08-27 DIAGNOSIS — Z7984 Long term (current) use of oral hypoglycemic drugs: Secondary | ICD-10-CM | POA: Diagnosis not present

## 2019-08-27 MED ORDER — ONDANSETRON 8 MG PO TBDP
8.0000 mg | ORAL_TABLET | Freq: Once | ORAL | Status: AC
  Administered 2019-08-27: 12:00:00 8 mg via ORAL
  Filled 2019-08-27: qty 1

## 2019-08-27 MED ORDER — TRAMADOL HCL 50 MG PO TABS: 50.0000 mg | ORAL_TABLET | Freq: Two times a day (BID) | ORAL | 0 refills | Status: DC | PRN

## 2019-08-27 MED ORDER — ONDANSETRON HCL 8 MG PO TABS: 8.0000 mg | ORAL_TABLET | Freq: Three times a day (TID) | ORAL | 0 refills | Status: DC | PRN

## 2019-08-27 MED ORDER — TRAMADOL HCL 50 MG PO TABS: 50.0000 mg | ORAL_TABLET | Freq: Two times a day (BID) | ORAL | 0 refills | Status: AC | PRN

## 2019-08-27 MED ORDER — HYDROMORPHONE HCL 1 MG/ML IJ SOLN
0.5000 mg | Freq: Once | INTRAMUSCULAR | Status: AC
  Administered 2019-08-27: 11:00:00 0.5 mg via INTRAMUSCULAR
  Filled 2019-08-27: qty 1

## 2019-08-27 MED ORDER — VALACYCLOVIR HCL 500 MG PO TABS: 500.0000 mg | ORAL_TABLET | Freq: Three times a day (TID) | ORAL | 0 refills | Status: AC

## 2019-08-27 NOTE — ED Triage Notes (Signed)
Pt to ED via POV c/o rash that started on her right buttock and has spread down her right leg. Rash is blistery and red, appearance consistent with shingles type rash. Pt reports pain that feels like "pins and needles". Pt is in NAD.

## 2019-08-27 NOTE — ED Provider Notes (Signed)
[facility] Emergency Department Provider Note   ____________________________________________   None    (approximate)  I have reviewed the triage vital signs and the nursing notes.   HISTORY  Chief Complaint Herpes Zoster    HPI Natasha [name] is a 74 y.o. female patient presents with a burning rash from her right buttocks to mid thigh.  Patient states acute onset 2 days ago.  Patient denies fever associated complaint.  Patient rates pain as 8/10.  No palliative measures for complaint.         Past Medical History:  Diagnosis Date  . Cancer [facility])    lymphoma-stomach   . 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 Diabetes mellitus without complication [facility])     Patient Active Problem List   Diagnosis Date Noted  . Generalized lymphadenopathy 09/08/2018  . 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 Diabetes mellitus type 2, diet-controlled ([place]) 07/07/2018  . Primary osteoarthritis of both knees 07/07/2018  . Leg cramping 07/07/2018  . Senile purpura ([place]) 07/07/2018    Past Surgical History:  Procedure Laterality Date  . TONSILLECTOMY Bilateral    as a child    Prior to Admission medications   Medication Sig Start Date End Date Taking? Authorizing Provider  ACCU-CHEK SOFTCLIX LANCETS lancets Use as instructed 08/23/18   Steele Sizer, MD  Blood Glucose Monitoring Suppl (ACCU-CHEK AVIVA PLUS) w/Device KIT Place 1 each into alternate nostrils 2 (two) times daily. 08/22/18   Sowles, Drue Stager, MD  glipiZIDE (GLUCOTROL XL) 5 MG 24 hr tablet TAKE ONE TABLET BY MOUTH EVERY DAY WITH BREAKFAST. (GENERIC FOR GLUCOTROL XL) 11/03/18   Sowles, Drue Stager, MD  glucose blood (ACCU-CHEK AVIVA PLUS) test strip Use as instructed 08/22/18   Steele Sizer, MD  metFORMIN (GLUCOPHAGE-XR) 750 MG 24 hr tablet TAKE TWO TABLETS BY MOUTH EVERY DAY WITH BREAKFAST 11/03/18   Ancil Boozer, Drue Stager, MD  omeprazole (PRILOSEC) 40  MG capsule TAKE ONE CAPSULE BY MOUTH EVERY DAY 11/03/18   Ancil Boozer, Drue Stager, MD  rOPINIRole (REQUIP) 1 MG tablet TAKE ONE TABLET BY MOUTH EVERY DAY AT BEDTIME 11/03/18   Steele Sizer, MD  traMADol (ULTRAM) 50 MG tablet Take 1 tablet (50 mg total) by mouth every 8 (eight) hours as needed. 10/12/18   Steele Sizer, MD  traMADol (ULTRAM) 50 MG tablet Take 1 tablet (50 mg total) by mouth every 12 (twelve) hours as needed for up to 7 days. 08/27/19 09/03/19  Sable Feil, PA-C  valACYclovir (VALTREX) 500 MG tablet Take 1 tablet (500 mg total) by mouth 3 (three) times daily for 7 days. 08/27/19 09/03/19  Sable Feil, PA-C    Allergies Patient has no known allergies.  Family History  Problem Relation Age of Onset  . Cancer Mother        breast  . Cancer Father        lung  . Alzheimer's disease Father   . Varicose Veins Brother   . Breast cancer Neg Hx     Social History Social History   Tobacco Use  . Smoking status: Current Every Day Smoker    Packs/day: 1.00    Years: 55.00    Pack years: 55.00    Types: Cigarettes    Start date: 07/07/1961  . Smokeless tobacco: Never Used  Substance Use Topics  . Alcohol use: No  . Drug use: Never    Review of Systems  Constitutional: No fever/chills  Eyes: No visual changes. ENT: No sore throat. Cardiovascular: Denies chest pain. Respiratory: Denies shortness of breath. Gastrointestinal: No abdominal pain.  No nausea, no vomiting.  No diarrhea.  No constipation. Genitourinary: Negative for dysuria. Musculoskeletal: Negative for back pain. Skin: Positive for rash. Neurological: Negative for headaches, focal weakness or numbness. Endocrine:  Diabetes. ____________________________________________   PHYSICAL EXAM:  VITAL SIGNS: ED Triage Vitals [08/27/19 1105]  Enc Vitals Group     BP 123/64     Pulse Rate 86     Resp 16     Temp 99 F (37.2 C)     Temp Source Oral     SpO2 97 %     Weight 180 lb (81.6 kg)     Height 5'  5" (1.651 m)     Head Circumference      Peak Flow      Pain Score 8     Pain Loc      Pain Edu?      Excl. in [place]?    Constitutional: Alert and oriented. Well appearing and in no acute distress. Cardiovascular: Normal rate, regular rhythm. Grossly normal heart sounds.  Good peripheral circulation. Respiratory: Normal respiratory effort.  No retractions. Lungs CTAB. Skin:  Skin is warm, dry and intact.  Multiple vascular lesion on erythematous base radiating from the right buttocks to the mid thigh. Psychiatric: Mood and affect are normal. Speech and behavior are normal.  ____________________________________________   LABS (all labs ordered are listed, but only abnormal results are displayed)  Labs Reviewed - No data to display ____________________________________________  EKG   ____________________________________________  RADIOLOGY  ED MD interpretation:    Official radiology report(s): No results found.  ____________________________________________   PROCEDURES  Procedure(s) performed (including Critical Care):  Procedures   ____________________________________________   INITIAL IMPRESSION / ASSESSMENT AND PLAN / ED COURSE  As part of my medical decision making, I reviewed the following data within the [place] [facility] was evaluated in Emergency Department on 08/27/2019 for the symptoms described in the history of present illness. She was evaluated in the context of the global COVID-19 pandemic, which necessitated consideration that the patient might be at risk for infection with the SARS-CoV-2 virus that causes COVID-19. Institutional protocols and algorithms that pertain to the evaluation of patients at risk for COVID-19 are in a state of rapid change based on information released by regulatory bodies including the CDC and federal and state organizations. These policies and algorithms were followed during the patient's care in  the ED.   Patient presented with 2-day onset of a skin rash radiating from her right buttocks to mid thigh.  Physical exam is consistent with herpes zoster.  Patient given discharge care instructions prescription for Valtrex and tramadol.  Patient advised follow-up PCP.      ____________________________________________   FINAL CLINICAL IMPRESSION(S) / ED DIAGNOSES  Final diagnoses:  Herpes zoster of thigh     ED Discharge Orders         Ordered    valACYclovir (VALTREX) 500 MG tablet  3 times daily     08/27/19 1121    traMADol (ULTRAM) 50 MG tablet  Every 12 hours PRN     08/27/19 1121           Note:  This document was prepared using Dragon voice recognition software and may include unintentional dictation errors.    Sable Feil, PA-C 08/27/19  1126    Earleen Newport, MD 08/27/19 1136

## 2019-08-27 NOTE — ED Notes (Signed)
See triage note  Presents with rash to lower back and to right hip area   States she noticed the rash over night

## 2019-09-03 ENCOUNTER — Other Ambulatory Visit: Payer: Self-pay | Admitting: Family Medicine

## 2019-09-03 DIAGNOSIS — E2839 Other primary ovarian failure: Secondary | ICD-10-CM

## 2019-09-18 ENCOUNTER — Emergency Department
Admission: EM | Admit: 2019-09-18 | Discharge: 2019-09-18 | Disposition: A | Discharge status: Home / Self Care | Payer: Medicare Other | Attending: Emergency Medicine | Admitting: Emergency Medicine

## 2019-09-18 ENCOUNTER — Encounter: Payer: Self-pay | Admitting: Emergency Medicine

## 2019-09-18 ENCOUNTER — Other Ambulatory Visit: Payer: Self-pay

## 2019-09-18 DIAGNOSIS — L03312 Cellulitis of back [any part except buttock]: Secondary | ICD-10-CM | POA: Insufficient documentation

## 2019-09-18 DIAGNOSIS — Z7984 Long term (current) use of oral hypoglycemic drugs: Secondary | ICD-10-CM | POA: Diagnosis not present

## 2019-09-18 DIAGNOSIS — Z856 Personal history of leukemia: Secondary | ICD-10-CM | POA: Insufficient documentation

## 2019-09-18 DIAGNOSIS — E119 Type 2 diabetes mellitus without complications: Secondary | ICD-10-CM | POA: Insufficient documentation

## 2019-09-18 DIAGNOSIS — Z79899 Other long term (current) drug therapy: Secondary | ICD-10-CM | POA: Insufficient documentation

## 2019-09-18 DIAGNOSIS — R21 Rash and other nonspecific skin eruption: Secondary | ICD-10-CM | POA: Diagnosis present

## 2019-09-18 DIAGNOSIS — F17210 Nicotine dependence, cigarettes, uncomplicated: Secondary | ICD-10-CM | POA: Insufficient documentation

## 2019-09-18 LAB — CBC WITH DIFFERENTIAL/PLATELET
Abs Immature Granulocytes: 0.03 10*3/uL (ref 0.00–0.07)
Basophils Absolute: 0 10*3/uL (ref 0.0–0.1)
Basophils Relative: 1 %
Eosinophils Absolute: 0.2 10*3/uL (ref 0.0–0.5)
Eosinophils Relative: 4 %
HCT: 38.8 % (ref 36.0–46.0)
Hemoglobin: 13.3 g/dL (ref 12.0–15.0)
Immature Granulocytes: 1 %
Lymphocytes Relative: 20 %
Lymphs Abs: 1 10*3/uL (ref 0.7–4.0)
MCH: 28.9 pg (ref 26.0–34.0)
MCHC: 34.3 g/dL (ref 30.0–36.0)
MCV: 84.2 fL (ref 80.0–100.0)
Monocytes Absolute: 0.5 10*3/uL (ref 0.1–1.0)
Monocytes Relative: 10 %
Neutro Abs: 3.4 10*3/uL (ref 1.7–7.7)
Neutrophils Relative %: 64 %
Platelets: 157 10*3/uL (ref 150–400)
RBC: 4.61 MIL/uL (ref 3.87–5.11)
RDW: 14 % (ref 11.5–15.5)
WBC: 5.3 10*3/uL (ref 4.0–10.5)
nRBC: 0 % (ref 0.0–0.2)

## 2019-09-18 LAB — BASIC METABOLIC PANEL
Anion gap: 9 (ref 5–15)
BUN: 23 mg/dL (ref 8–23)
CO2: 28 mmol/L (ref 22–32)
Calcium: 9.3 mg/dL (ref 8.9–10.3)
Chloride: 103 mmol/L (ref 98–111)
Creatinine, Ser: 0.73 mg/dL (ref 0.44–1.00)
GFR calc Af Amer: >60 mL/min (ref >60)
GFR calc non Af Amer: >60 mL/min (ref >60)
Glucose, Bld: 338 mg/dL — ABNORMAL HIGH (ref 70–99)
Potassium: 3.7 mmol/L (ref 3.5–5.1)
Sodium: 140 mmol/L (ref 135–145)

## 2019-09-18 LAB — GLUCOSE, CAPILLARY: Glucose-Capillary: 292 mg/dL — ABNORMAL HIGH (ref 70–99)

## 2019-09-18 MED ORDER — ACETAMINOPHEN 500 MG PO TABS
1000.0000 mg | ORAL_TABLET | Freq: Once | ORAL | Status: AC
  Administered 2019-09-18: 1000 mg via ORAL
  Filled 2019-09-18: qty 2

## 2019-09-18 MED ORDER — TRAMADOL HCL 50 MG PO TABS
50.0000 mg | ORAL_TABLET | Freq: Once | ORAL | Status: AC
  Administered 2019-09-18: 50 mg via ORAL
  Filled 2019-09-18: qty 1

## 2019-09-18 MED ORDER — TRAMADOL HCL 50 MG PO TABS: 50.0000 mg | ORAL_TABLET | Freq: Four times a day (QID) | ORAL | 0 refills | Status: DC | PRN

## 2019-09-18 MED ORDER — CLINDAMYCIN HCL 300 MG PO CAPS: 300.0000 mg | ORAL_CAPSULE | Freq: Three times a day (TID) | ORAL | 0 refills | Status: AC

## 2019-09-18 MED ORDER — CLINDAMYCIN PHOSPHATE 300 MG/50ML IV SOLN
300.0000 mg | Freq: Once | INTRAVENOUS | Status: AC
  Administered 2019-09-18: 07:00:00 300 mg via INTRAVENOUS
  Filled 2019-09-18: qty 50

## 2019-09-18 NOTE — ED Notes (Signed)
This [facility] pharmacy regarding missing medication.

## 2019-09-18 NOTE — ED Notes (Signed)
Dressing applied to wounds on sacral area by MD Alfred Levins

## 2019-09-18 NOTE — ED Provider Notes (Signed)
[facility] Emergency Department Provider Note  ____________________________________________  Time seen: Approximately 5:28 AM  I have reviewed the triage vital signs and the nursing notes.   HISTORY  Chief Complaint Herpes Zoster   HPI Natasha [name] is a 73 y.o. female with several chronic medical problems as listed below who presents for evaluation of shingles.  Patient was seen here 3 weeks ago and diagnosed with shingles.  She was sent home on Valtrex and tramadol.  She reports that she felt the rash was improving however she has location in her right lower back where the skin peeled off and she started noticed some purulent discharge coming from it.  She reports that the pain has been severe and constant for the last 3 weeks.  She ran out of the tramadol she was given.  She denies fever or chills.   Past Medical History:  Diagnosis Date  . Cancer [facility])    lymphoma-stomach   . 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 Diabetes mellitus without complication [facility])     Patient Active Problem List   Diagnosis Date Noted  . Generalized lymphadenopathy 09/08/2018  . 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 SI (sacroiliac) joint inflammation ([place]) 08/14/2018  . Diabetes mellitus type 2, diet-controlled ([place]) 07/07/2018  . Primary osteoarthritis of both knees 07/07/2018  . Leg cramping 07/07/2018  . Senile purpura ([place]) 07/07/2018    Past Surgical History:  Procedure Laterality Date  . TONSILLECTOMY Bilateral    as a child    Prior to Admission medications   Medication Sig Start Date End Date Taking? Authorizing Provider  ACCU-CHEK SOFTCLIX LANCETS lancets Use as instructed 08/23/18   Steele Sizer, MD  Blood Glucose Monitoring Suppl (ACCU-CHEK AVIVA PLUS) w/Device KIT Place 1 each into alternate nostrils 2 (two) times daily. 08/22/18   Steele Sizer, MD  clindamycin (CLEOCIN) 300 MG capsule Take 1 capsule (300 mg total) by mouth 3 (three) times  daily for 10 days. 09/18/19 09/28/19  Alfred Levins, [place], MD  glipiZIDE (GLUCOTROL XL) 5 MG 24 hr tablet TAKE ONE TABLET BY MOUTH EVERY DAY WITH BREAKFAST. (GENERIC FOR GLUCOTROL XL) 11/03/18   Sowles, Drue Stager, MD  glucose blood (ACCU-CHEK AVIVA PLUS) test strip Use as instructed 08/22/18   Steele Sizer, MD  metFORMIN (GLUCOPHAGE-XR) 750 MG 24 hr tablet TAKE TWO TABLETS BY MOUTH EVERY DAY WITH BREAKFAST 11/03/18   Ancil Boozer, Drue Stager, MD  omeprazole (PRILOSEC) 40 MG capsule TAKE ONE CAPSULE BY MOUTH EVERY DAY 11/03/18   Steele Sizer, MD  ondansetron (ZOFRAN) 8 MG tablet Take 1 tablet (8 mg total) by mouth every 8 (eight) hours as needed for nausea or vomiting. 08/27/19   Sable Feil, PA-C  rOPINIRole (REQUIP) 1 MG tablet TAKE ONE TABLET BY MOUTH EVERY DAY AT BEDTIME 11/03/18   Steele Sizer, MD  traMADol (ULTRAM) 50 MG tablet Take 1 tablet (50 mg total) by mouth every 6 (six) hours as needed. 09/18/19 09/17/20  Rudene Re, MD    Allergies Patient has no known allergies.  Family History  Problem Relation Age of Onset  . Cancer Mother        breast  . Cancer Father        lung  . Alzheimer's disease Father   . Varicose Veins Brother   . Breast cancer Neg Hx     Social History Social History   Tobacco Use  . Smoking status: Current Every Day Smoker    Packs/day: 1.00  Years: 55.00    Pack years: 55.00    Types: Cigarettes    Start date: 07/07/1961  . Smokeless tobacco: Never Used  Substance Use Topics  . Alcohol use: No  . Drug use: Never    Review of Systems  Constitutional: Negative for fever. Eyes: Negative for visual changes. ENT: Negative for sore throat. Neck: No neck pain  Cardiovascular: Negative for chest pain. Respiratory: Negative for shortness of breath. Gastrointestinal: Negative for abdominal pain, vomiting or diarrhea. Genitourinary: Negative for dysuria. Musculoskeletal: Negative for back pain. Skin: + rash. Neurological: Negative for  headaches, weakness or numbness. Psych: No SI or HI  ____________________________________________   PHYSICAL EXAM:  VITAL SIGNS: ED Triage Vitals  Enc Vitals Group     BP 09/18/19 0329 122/77     Pulse Rate 09/18/19 0329 90     Resp 09/18/19 0329 20     Temp 09/18/19 0329 98 F (36.7 C)     Temp Source 09/18/19 0329 Oral     SpO2 09/18/19 0329 97 %     Weight 09/18/19 0321 179 lb 14.3 oz (81.6 kg)     Height 09/18/19 0321 _0  (1.651 m)     Head Circumference --      Peak Flow --      Pain Score 09/18/19 0329 10     Pain Loc --      Pain Edu? --      Excl. in [place]? --     Constitutional: Alert and oriented. Well appearing and in no apparent distress. HEENT:      Head: Normocephalic and atraumatic.         Eyes: Conjunctivae are normal. Sclera is non-icteric.       Mouth/Throat: Mucous membranes are moist.       Neck: Supple with no signs of meningismus. Cardiovascular: Regular rate and rhythm.  Respiratory: Normal respiratory effort.  Musculoskeletal: Nontender with normal range of motion in all extremities. No edema, cyanosis, or erythema of extremities. Neurologic: Normal speech and language. Face is symmetric. Moving all extremities. No gross focal neurologic deficits are appreciated. Skin: Skin is warm, dry and intact. Shingles rash located in the R lower back and buttock region, the lesions are all dry and healing. There is a larger lesion on the back with minimal amount of purulent discharge and mild surrounding erythema. Psychiatric: Mood and affect are normal. Speech and behavior are normal.  ____________________________________________   LABS (all labs ordered are listed, but only abnormal results are displayed)  Labs Reviewed  GLUCOSE, CAPILLARY - Abnormal; Notable for the following components:      Result Value   Glucose-Capillary 292 (*)    All other components within normal limits  BASIC METABOLIC PANEL - Abnormal; Notable for the following components:    Glucose, Bld 338 (*)    All other components within normal limits  CBC WITH DIFFERENTIAL/PLATELET  CBG MONITORING, ED   ____________________________________________  EKG  none  ____________________________________________  RADIOLOGY  none  ____________________________________________   PROCEDURES  Procedure(s) performed: None Procedures Critical Care performed:  None ____________________________________________   INITIAL IMPRESSION / ASSESSMENT AND PLAN / ED COURSE  73 y.o. female with several chronic medical problems as listed below who presents for evaluation of shingles.  Patient has had shingles infection now for 3 weeks.  The lesions are clear dry and crusted and in the healing process.  There is a larger lesion that now seems to have overlying cellulitis with a small amount of  pus and erythema.  There are no signs of sepsis or systemic symptoms.  No fluctuance or abscess. Labs showing mild hyperglycemia in a patient with known history of diabetes in the setting of not taking her morning diabetes medication. Patient given tramadol and tylenol for pain. Clindamycin for cellulitis.  Discussed wound care and close follow-up for wound reassessment with her PCP in 48 hours.  Discussed return precautions for any signs of systemic infection or progression of current cellulitis.       As part of my medical decision making, I reviewed the following data within the [place] notes reviewed and incorporated, Labs reviewed , Old chart reviewed, Notes from prior ED visits and [place] Controlled Substance Database   Please note:  Patient was evaluated in Emergency Department today for the symptoms described in the history of present illness. Patient was evaluated in the context of the global COVID-19 pandemic, which necessitated consideration that the patient might be at risk for infection with the SARS-CoV-2 virus that causes COVID-19. Institutional protocols and  algorithms that pertain to the evaluation of patients at risk for COVID-19 are in a state of rapid change based on information released by regulatory bodies including the CDC and federal and state organizations. These policies and algorithms were followed during the patient's care in the ED.  Some ED evaluations and interventions may be delayed as a result of limited staffing during the pandemic.   ____________________________________________   FINAL CLINICAL IMPRESSION(S) / ED DIAGNOSES   Final diagnoses:  Cellulitis of back except buttock      NEW MEDICATIONS STARTED DURING THIS VISIT:  ED Discharge Orders         Ordered    clindamycin (CLEOCIN) 300 MG capsule  3 times daily     09/18/19 0651    traMADol (ULTRAM) 50 MG tablet  Every 6 hours PRN     09/18/19 0651           Note:  This document was prepared using Dragon voice recognition software and may include unintentional dictation errors.    Rudene Re, MD 09/18/19 [phone]

## 2019-09-18 NOTE — ED Triage Notes (Signed)
Pt to triage via w/c with no distress noted, mask in place,brought in by EMS from home for c/o shingles; st "out of her meds for 3wks"; pt was seen 11/2 and rx 7 day course of valtrex

## 2019-09-18 NOTE — Discharge Instructions (Addendum)
Take the antibiotics fully as prescribed.  Change the dressing once every 24 hours.  Keep the area dry and clean.  Is okay to wash with warm water and soap.  Return to the emergency room if there are red area is becoming larger, to have a fever, if you notice more pus coming from it.  Otherwise follow-up with your doctor in 2 days for reevaluation.

## 2019-09-18 NOTE — ED Notes (Signed)
Pt resting in bed with no complaints. Bed locked and low.

## 2019-10-09 ENCOUNTER — Other Ambulatory Visit: Payer: Self-pay | Admitting: Family Medicine

## 2019-10-09 DIAGNOSIS — R1032 Left lower quadrant pain: Secondary | ICD-10-CM

## 2019-10-09 DIAGNOSIS — C8590 Non-Hodgkin lymphoma, unspecified, unspecified site: Secondary | ICD-10-CM

## 2019-10-09 DIAGNOSIS — R161 Splenomegaly, not elsewhere classified: Secondary | ICD-10-CM

## 2019-10-09 DIAGNOSIS — R1031 Right lower quadrant pain: Secondary | ICD-10-CM

## 2019-10-10 ENCOUNTER — Other Ambulatory Visit: Payer: Self-pay

## 2019-10-10 ENCOUNTER — Ambulatory Visit (HOSPITAL_COMMUNITY): Payer: Medicare Other

## 2019-10-10 ENCOUNTER — Ambulatory Visit
Admission: RE | Admit: 2019-10-10 | Discharge: 2019-10-10 | Disposition: A | Discharge status: Home / Self Care | Payer: Medicare Other | Source: Ambulatory Visit | Attending: Family Medicine | Admitting: Family Medicine

## 2019-10-10 DIAGNOSIS — R161 Splenomegaly, not elsewhere classified: Secondary | ICD-10-CM | POA: Diagnosis present

## 2019-10-10 DIAGNOSIS — C8590 Non-Hodgkin lymphoma, unspecified, unspecified site: Secondary | ICD-10-CM | POA: Insufficient documentation

## 2019-10-10 DIAGNOSIS — R1032 Left lower quadrant pain: Secondary | ICD-10-CM | POA: Insufficient documentation

## 2019-10-10 DIAGNOSIS — R1031 Right lower quadrant pain: Secondary | ICD-10-CM

## 2019-10-10 MED ORDER — IOHEXOL 300 MG/ML  SOLN
100.0000 mL | Freq: Once | INTRAMUSCULAR | Status: AC | PRN
  Administered 2019-10-10: 100 mL via INTRAVENOUS

## 2019-10-23 DIAGNOSIS — K8020 Calculus of gallbladder without cholecystitis without obstruction: Secondary | ICD-10-CM | POA: Insufficient documentation

## 2019-10-23 DIAGNOSIS — C8590 Non-Hodgkin lymphoma, unspecified, unspecified site: Secondary | ICD-10-CM | POA: Insufficient documentation

## 2019-12-11 DIAGNOSIS — M5442 Lumbago with sciatica, left side: Secondary | ICD-10-CM | POA: Diagnosis not present

## 2019-12-11 DIAGNOSIS — R21 Rash and other nonspecific skin eruption: Secondary | ICD-10-CM | POA: Diagnosis not present

## 2019-12-11 DIAGNOSIS — M545 Low back pain: Secondary | ICD-10-CM | POA: Diagnosis not present

## 2019-12-12 ENCOUNTER — Other Ambulatory Visit: Payer: Self-pay | Admitting: Family Medicine

## 2019-12-12 DIAGNOSIS — M545 Low back pain, unspecified: Secondary | ICD-10-CM

## 2019-12-20 ENCOUNTER — Ambulatory Visit
Admission: RE | Admit: 2019-12-20 | Discharge: 2019-12-20 | Disposition: A | Discharge status: Home / Self Care | Payer: Medicare Other | Source: Ambulatory Visit | Attending: Family Medicine | Admitting: Family Medicine

## 2019-12-20 ENCOUNTER — Other Ambulatory Visit: Payer: Self-pay

## 2019-12-20 DIAGNOSIS — M545 Low back pain, unspecified: Secondary | ICD-10-CM

## 2019-12-31 DIAGNOSIS — E119 Type 2 diabetes mellitus without complications: Secondary | ICD-10-CM | POA: Diagnosis not present

## 2019-12-31 DIAGNOSIS — S32030A Wedge compression fracture of third lumbar vertebra, initial encounter for closed fracture: Secondary | ICD-10-CM | POA: Diagnosis not present

## 2020-01-21 DIAGNOSIS — C8590 Non-Hodgkin lymphoma, unspecified, unspecified site: Secondary | ICD-10-CM | POA: Diagnosis not present

## 2020-01-21 DIAGNOSIS — M461 Sacroiliitis, not elsewhere classified: Secondary | ICD-10-CM | POA: Diagnosis not present

## 2020-01-21 DIAGNOSIS — B0229 Other postherpetic nervous system involvement: Secondary | ICD-10-CM | POA: Diagnosis not present

## 2020-01-21 DIAGNOSIS — E119 Type 2 diabetes mellitus without complications: Secondary | ICD-10-CM | POA: Diagnosis not present

## 2020-01-21 LAB — HM DIABETES EYE EXAM

## 2020-01-23 ENCOUNTER — Other Ambulatory Visit: Payer: Self-pay | Admitting: Family Medicine

## 2020-01-23 DIAGNOSIS — Z1231 Encounter for screening mammogram for malignant neoplasm of breast: Secondary | ICD-10-CM

## 2020-02-14 DIAGNOSIS — Z1211 Encounter for screening for malignant neoplasm of colon: Secondary | ICD-10-CM | POA: Diagnosis not present

## 2020-02-28 ENCOUNTER — Ambulatory Visit: Payer: Medicare HMO | Admitting: Podiatry

## 2020-03-04 ENCOUNTER — Ambulatory Visit: Payer: Medicare Other | Attending: Family Medicine

## 2020-03-14 DIAGNOSIS — B0229 Other postherpetic nervous system involvement: Secondary | ICD-10-CM | POA: Insufficient documentation

## 2020-03-18 ENCOUNTER — Other Ambulatory Visit (HOSPITAL_COMMUNITY): Payer: Self-pay | Admitting: Family Medicine

## 2020-03-18 ENCOUNTER — Other Ambulatory Visit: Payer: Self-pay | Admitting: Family Medicine

## 2020-03-18 DIAGNOSIS — H539 Unspecified visual disturbance: Secondary | ICD-10-CM

## 2020-03-18 DIAGNOSIS — C8590 Non-Hodgkin lymphoma, unspecified, unspecified site: Secondary | ICD-10-CM

## 2020-03-18 DIAGNOSIS — R519 Headache, unspecified: Secondary | ICD-10-CM

## 2020-03-19 ENCOUNTER — Ambulatory Visit
Admission: RE | Admit: 2020-03-19 | Discharge: 2020-03-19 | Disposition: A | Discharge status: Home / Self Care | Payer: Medicare Other | Source: Ambulatory Visit | Attending: Family Medicine | Admitting: Family Medicine

## 2020-03-19 ENCOUNTER — Other Ambulatory Visit: Payer: Self-pay

## 2020-03-19 DIAGNOSIS — R519 Headache, unspecified: Secondary | ICD-10-CM | POA: Diagnosis present

## 2020-03-19 DIAGNOSIS — C8590 Non-Hodgkin lymphoma, unspecified, unspecified site: Secondary | ICD-10-CM

## 2020-03-19 DIAGNOSIS — H539 Unspecified visual disturbance: Secondary | ICD-10-CM

## 2020-03-19 MED ORDER — IOHEXOL 300 MG/ML  SOLN
75.0000 mL | Freq: Once | INTRAMUSCULAR | Status: AC | PRN
  Administered 2020-03-19: 75 mL via INTRAVENOUS

## 2020-04-23 ENCOUNTER — Ambulatory Visit: Admit: 2020-04-23 | Payer: Medicare Other | Admitting: Ophthalmology

## 2020-04-23 SURGERY — PHACOEMULSIFICATION, CATARACT, WITH IOL INSERTION
Anesthesia: Topical | Laterality: Right

## 2020-05-27 DIAGNOSIS — R6889 Other general symptoms and signs: Secondary | ICD-10-CM | POA: Diagnosis not present

## 2020-07-24 IMAGING — US US EXTREM LOW VENOUS*L*
1 series · 13 of 24 positions shown · non-contrast
Comparison: None.

CLINICAL DATA: Left lower extremity pain and edema.



[Series 1: us extrem low venous*left* · 13 of 38 slices shown]
[im 1/38]
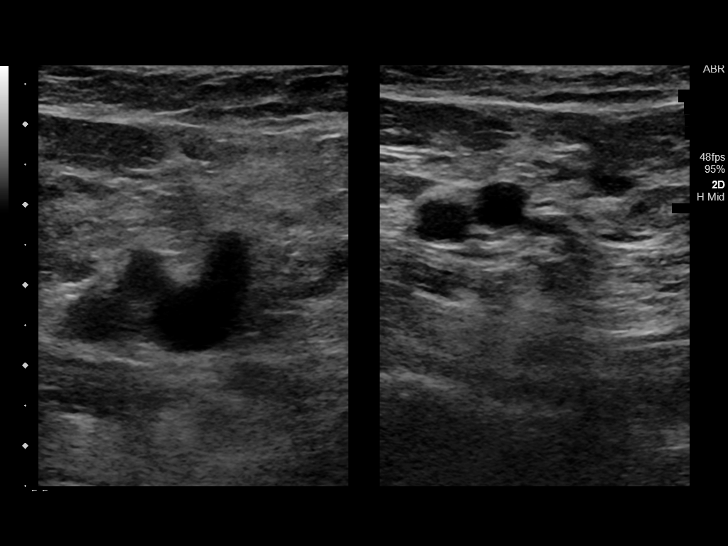
[im 4/38]
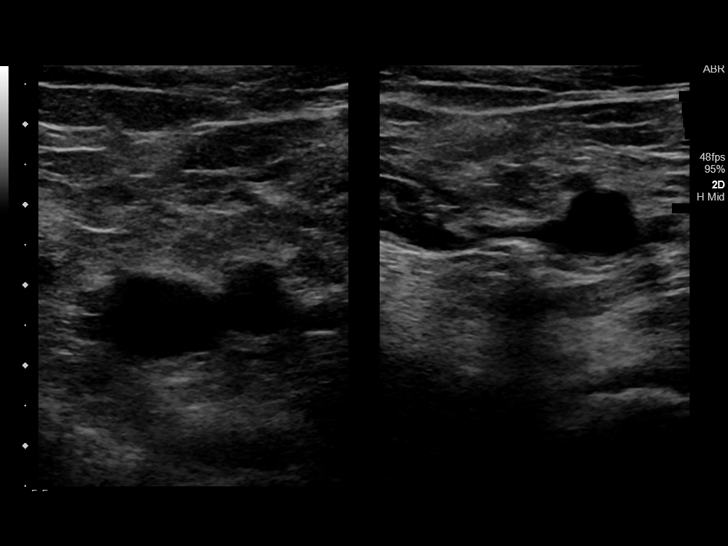
[im 7/38]
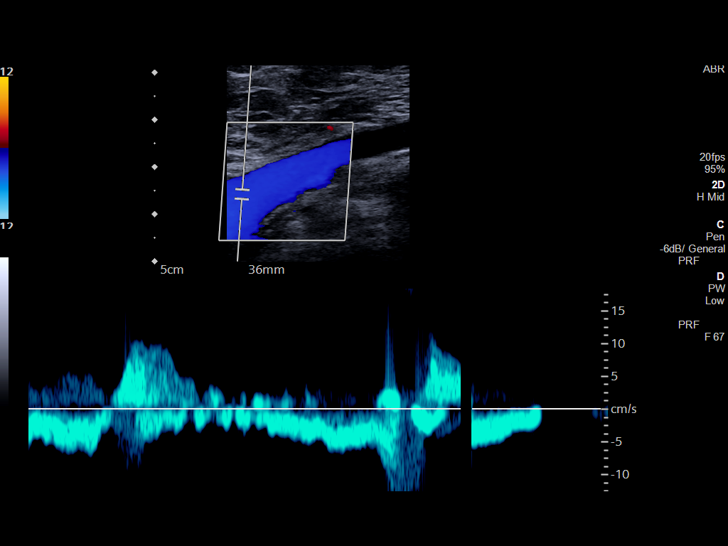
[im 10/38]
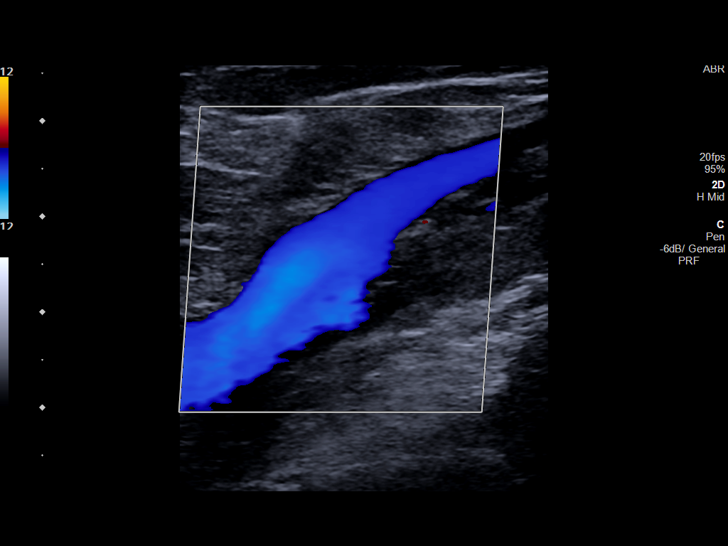
[im 13/38]
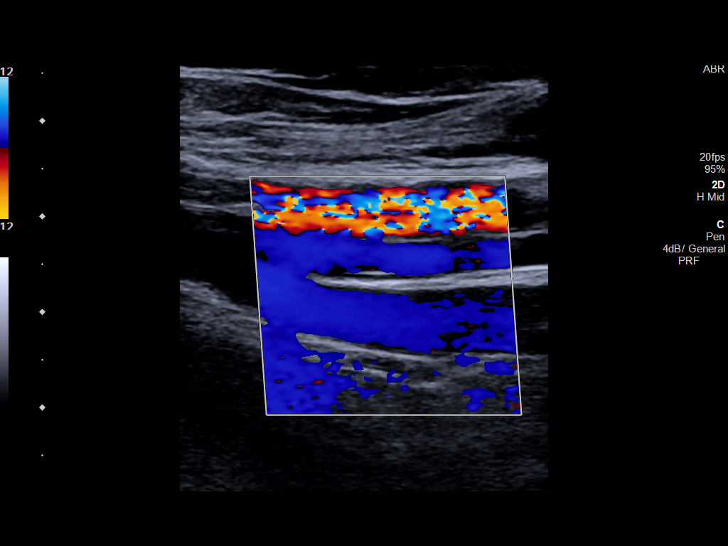
[im 17/38]
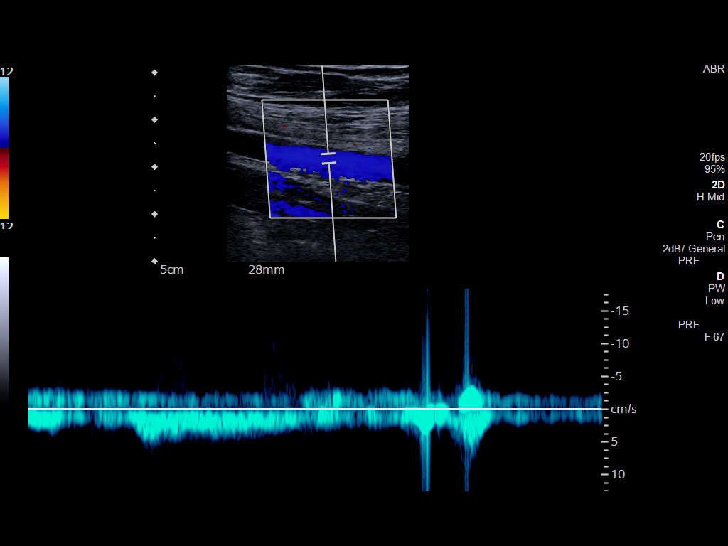
[im 20/38]
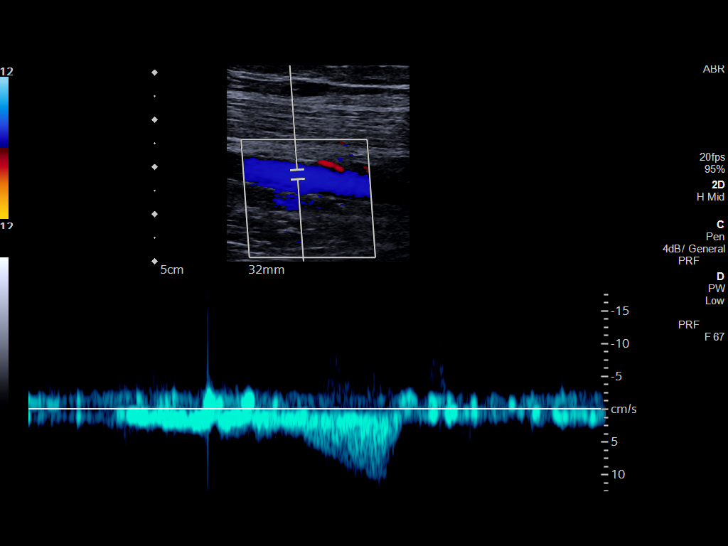
[im 21/38]
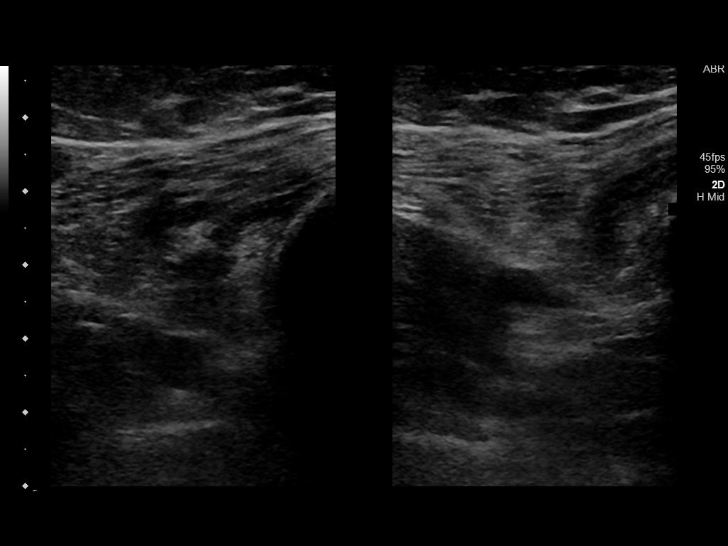
[im 25/38]
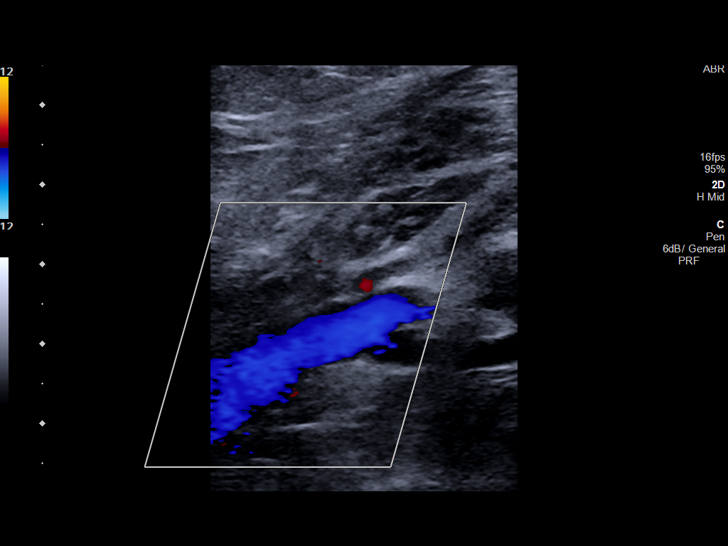
[im 28/38]
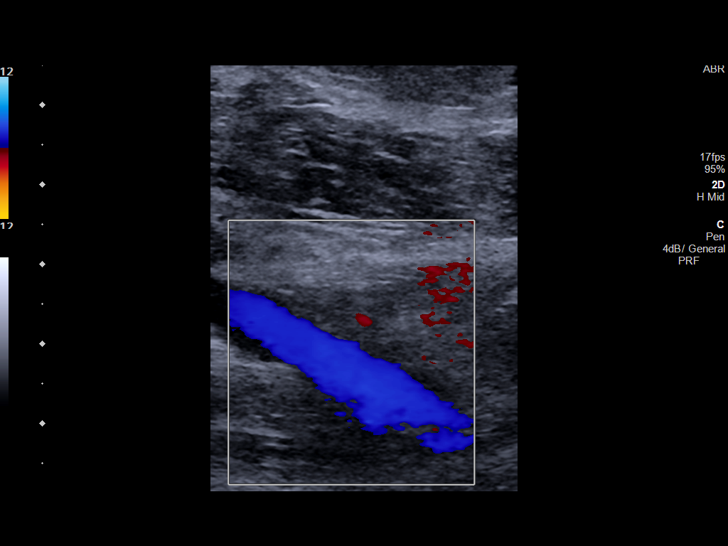
[im 31/38]
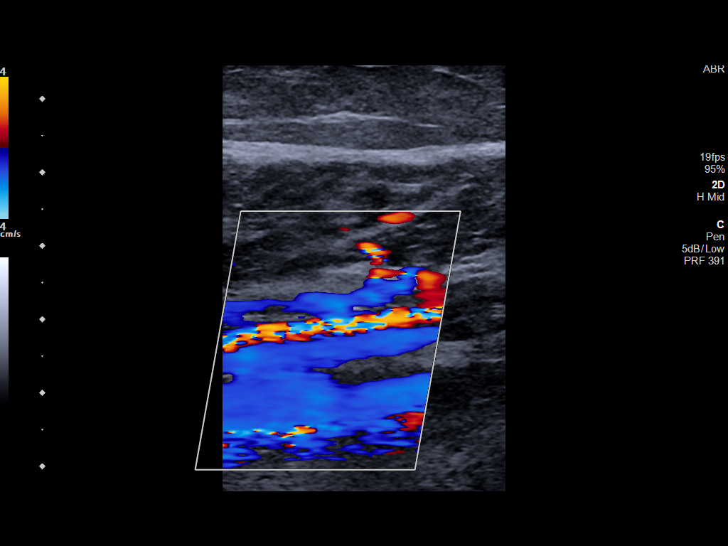
[im 34/38]
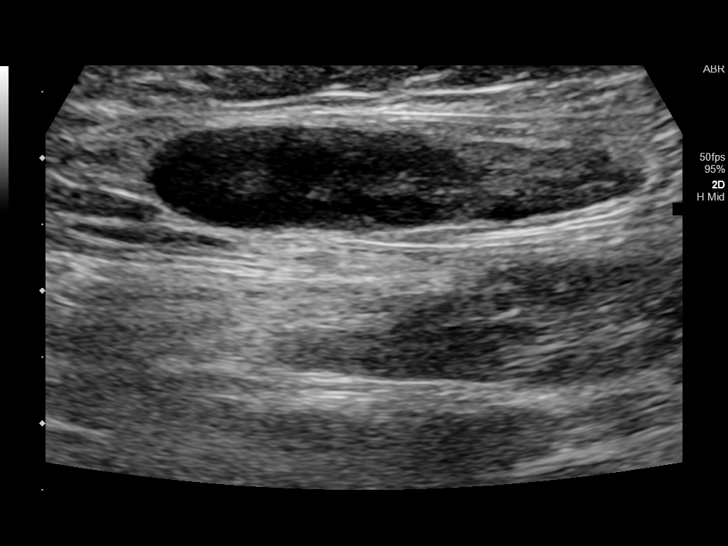
[im 38/38]
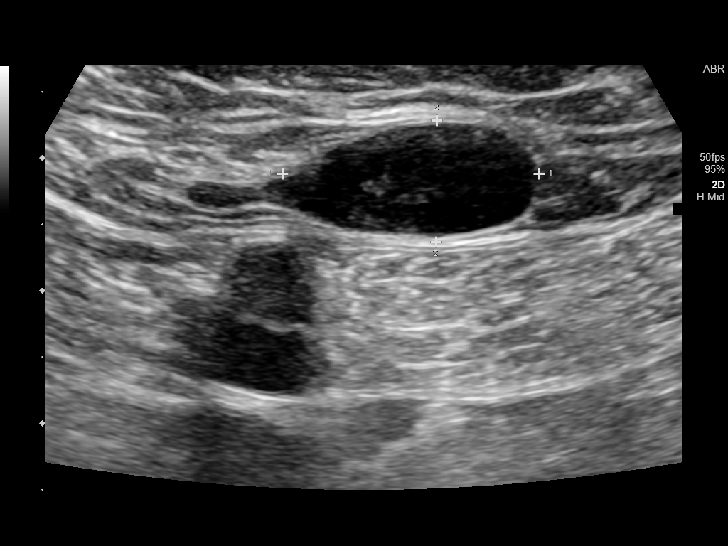

[13 of 24 positions shown; findings below may reference images not displayed]

FINDINGS: Contralateral Common Femoral Vein: Respiratory phasicity is normal
and symmetric with the symptomatic side. No evidence of thrombus.
Normal compressibility.

Common Femoral Vein: No evidence of thrombus. Normal
compressibility, respiratory phasicity and response to augmentation.

Saphenofemoral Junction: No evidence of thrombus. Normal
compressibility and flow on color Doppler imaging.

Profunda Femoral Vein: No evidence of thrombus. Normal
compressibility and flow on color Doppler imaging.

Femoral Vein: No evidence of thrombus. Normal compressibility,
respiratory phasicity and response to augmentation.

Popliteal Vein: No evidence of thrombus. Normal compressibility,
respiratory phasicity and response to augmentation.

Calf Veins: No evidence of thrombus. Normal compressibility and flow
on color Doppler imaging.

Superficial Great Saphenous Vein: No evidence of thrombus. Normal
compressibility.

Venous Reflux:  None.

Other Findings: No evidence of superficial thrombophlebitis or
abnormal fluid collection. Nonspecific mildly prominent left
inguinal lymph node measures approximately 3.8 x 0.9 x 1.9 cm.
Although the short axis is not overtly enlarged, the sonographic
appearance of this lymph node suggests a possible pathologic lymph
node and correlation is suggested with physical exam and any prior
hematologic history.
IMPRESSION: 1. No evidence of left lower extremity deep venous thrombosis.
2. Mildly prominent left inguinal lymph node with sonographic
appearance suggesting possible pathologic lymph node. Correlation
suggested with physical exam and any prior hematologic medical
history.

## 2020-07-29 DIAGNOSIS — R6889 Other general symptoms and signs: Secondary | ICD-10-CM | POA: Diagnosis not present

## 2020-08-14 ENCOUNTER — Other Ambulatory Visit: Payer: Self-pay

## 2020-08-14 ENCOUNTER — Ambulatory Visit (INDEPENDENT_AMBULATORY_CARE_PROVIDER_SITE_OTHER): Payer: Medicare HMO | Admitting: Family Medicine

## 2020-08-14 ENCOUNTER — Encounter: Payer: Self-pay | Admitting: Family Medicine

## 2020-08-14 VITALS — BP 110/74 | HR 56 | Temp 98.2°F | Ht 65.0 in | Wt 174.8 lb

## 2020-08-14 DIAGNOSIS — L609 Nail disorder, unspecified: Secondary | ICD-10-CM | POA: Diagnosis not present

## 2020-08-14 DIAGNOSIS — R6889 Other general symptoms and signs: Secondary | ICD-10-CM | POA: Diagnosis not present

## 2020-08-14 DIAGNOSIS — Z1211 Encounter for screening for malignant neoplasm of colon: Secondary | ICD-10-CM

## 2020-08-14 DIAGNOSIS — E119 Type 2 diabetes mellitus without complications: Secondary | ICD-10-CM

## 2020-08-14 DIAGNOSIS — Z720 Tobacco use: Secondary | ICD-10-CM | POA: Diagnosis not present

## 2020-08-14 DIAGNOSIS — C8590 Non-Hodgkin lymphoma, unspecified, unspecified site: Secondary | ICD-10-CM

## 2020-08-14 DIAGNOSIS — Z5320 Procedure and treatment not carried out because of patient's decision for unspecified reasons: Secondary | ICD-10-CM | POA: Diagnosis not present

## 2020-08-14 DIAGNOSIS — B0229 Other postherpetic nervous system involvement: Secondary | ICD-10-CM | POA: Diagnosis not present

## 2020-08-14 DIAGNOSIS — F17220 Nicotine dependence, chewing tobacco, uncomplicated: Secondary | ICD-10-CM | POA: Insufficient documentation

## 2020-08-14 DIAGNOSIS — F17210 Nicotine dependence, cigarettes, uncomplicated: Secondary | ICD-10-CM | POA: Insufficient documentation

## 2020-08-14 NOTE — Assessment & Plan Note (Signed)
Long and thick nails in pt with diabetes. Referral to podiatry for foot care

## 2020-08-14 NOTE — Assessment & Plan Note (Signed)
Discussed high risk for lung cancer and advised screening. She declined stating "she doesn't have cancer". Will continue to encourage smoking cessation

## 2020-08-14 NOTE — Patient Instructions (Signed)
You can call for a mammogram at these locations: ? ?[facility].  ?[address] ?[place] ?[phone] ? ? ? ?

## 2020-08-14 NOTE — Progress Notes (Signed)
Subjective:     Natasha [name] is a 74 y.o. female presenting for Establish Care, Herpes Zoster, and Podiatry referral     HPI  #Post herpetic neurology - on the right back, hip, thigh - before it was tingling - now with burning symptoms - saw Dr. Manuella Ghazi - initially on gabapentin w/o improvmenet - started lyrica 50 mg BID w/ improvement in treatment - has f/u with Dr. Manuella Ghazi on Monday  #Nails - nails are thick and long - has difficulty cutting nails and "out of control - would like to see podiatry  #Lymphoma - reports "its gone" - that it was gone before she moved up from [place] - one doctor told her it was gone, another doctor says no cancer   Review of Systems   Social History   Tobacco Use  Smoking Status Current Every Day Smoker  . Packs/day: 1.00  . Years: 55.00  . Pack years: 55.00  . Types: Cigarettes  . Start date: 07/07/1961  Smokeless Tobacco Never Used        Objective:    BP Readings from Last 3 Encounters:  08/14/20 110/74  09/18/19 107/75  08/27/19 123/64   Wt Readings from Last 3 Encounters:  08/14/20 174 lb 12 oz (79.3 kg)  09/18/19 179 lb 14.3 oz (81.6 kg)  08/27/19 180 lb (81.6 kg)    BP 110/74 (BP Location: Left Arm, Patient Position: Sitting, Cuff Size: Normal)   Pulse (!) 56   Temp 98.2 F (36.8 C) (Temporal)   Ht 5\' 5"  (1.651 m)   Wt 174 lb 12 oz (79.3 kg)   SpO2 96%   BMI 29.08 kg/m    Physical Exam Constitutional:      General: She is not in acute distress.    Appearance: She is well-developed. She is not diaphoretic.  HENT:     Right Ear: External ear normal.     Left Ear: External ear normal.     Nose: Nose normal.  Eyes:     Conjunctiva/sclera: Conjunctivae normal.  Cardiovascular:     Rate and Rhythm: Normal rate and regular rhythm.     Heart sounds: No murmur heard.   Pulmonary:     Effort: Pulmonary effort is normal. No respiratory distress.     Breath sounds: Normal breath sounds. No wheezing.    Musculoskeletal:     Cervical back: Neck supple.  Feet:     Comments: Long thick nails Skin:    General: Skin is warm and dry.     Capillary Refill: Capillary refill takes less than 2 seconds.  Neurological:     Mental Status: She is alert. Mental status is at baseline.  Psychiatric:        Mood and Affect: Mood normal.        Behavior: Behavior normal.           Assessment & Plan:   Problem List Items Addressed This Visit      Endocrine   Controlled type 2 diabetes mellitus without complication, without long-term current use of insulin (HCC)    Outside HgbA1c 5.8%. Continue diet and metformin 750 mg XR daily. Will address statin at next visit      Relevant Orders   Ambulatory referral to Podiatry   Ambulatory referral to Ophthalmology     Nervous and Auditory   Post herpetic neuralgia    Reviewed neurology note with Dr. Manuella Ghazi and trial of lyrica. She reports improvement with this medication. She has  neurology f/u on Monday, so elected to defer management to Dr. Manuella Ghazi        Musculoskeletal and Integument   Nail abnormality - Primary    Long and thick nails in pt with diabetes. 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 Reviewed visit from Dr.Brahmanday 10/2018 and she continues to have poor insight into disease, and declines any issues or desire for further work-up. Will continue to try to engage in care.       Tobacco use    Discussed high risk for lung cancer and advised screening. She declined stating "she doesn't have cancer". Will continue to encourage smoking cessation      Lung cancer screening declined by patient    Other Visit Diagnoses    Screening for colon cancer        Relevant Orders   Fecal occult blood, imunochemical       Return in about 6 months (around 02/12/2021) for wellness visit.  Lesleigh Noe, MD  This visit occurred during the SARS-CoV-2 public health emergency.  Safety protocols were in place, including screening questions prior to the visit, additional usage of staff PPE, and extensive cleaning of exam room while observing appropriate contact time as indicated for disinfecting solutions.

## 2020-08-14 NOTE — Assessment & Plan Note (Signed)
Reviewed neurology note with Dr. Manuella Ghazi and trial of lyrica. She reports improvement with this medication. She has neurology f/u on Monday, so elected to defer management to Dr. Manuella Ghazi

## 2020-08-14 NOTE — Assessment & Plan Note (Signed)
Outside HgbA1c 5.8%. Continue diet and metformin 750 mg XR daily. Will address statin at next visit

## 2020-08-14 NOTE — Assessment & Plan Note (Signed)
Reviewed CT 09/2019 with lymphadenopathy and showed images to patient to explain the abnormal findings and she was adament that she did not have cancer and declined any work-up. Reviewed visit from Dr.Brahmanday 10/2018 and she continues to have poor insight into disease, and declines any issues or desire for further work-up. Will continue to try to engage in care.

## 2020-08-18 DIAGNOSIS — R519 Headache, unspecified: Secondary | ICD-10-CM | POA: Diagnosis not present

## 2020-08-18 DIAGNOSIS — B0229 Other postherpetic nervous system involvement: Secondary | ICD-10-CM | POA: Diagnosis not present

## 2020-08-18 DIAGNOSIS — R6889 Other general symptoms and signs: Secondary | ICD-10-CM | POA: Diagnosis not present

## 2020-08-20 ENCOUNTER — Other Ambulatory Visit: Payer: Self-pay

## 2020-08-20 ENCOUNTER — Ambulatory Visit (INDEPENDENT_AMBULATORY_CARE_PROVIDER_SITE_OTHER): Payer: Medicare HMO | Admitting: Podiatry

## 2020-08-20 ENCOUNTER — Ambulatory Visit: Payer: Medicaid Other | Admitting: Podiatry

## 2020-08-20 DIAGNOSIS — B351 Tinea unguium: Secondary | ICD-10-CM

## 2020-08-20 DIAGNOSIS — R6889 Other general symptoms and signs: Secondary | ICD-10-CM | POA: Diagnosis not present

## 2020-08-20 DIAGNOSIS — M79674 Pain in right toe(s): Secondary | ICD-10-CM | POA: Diagnosis not present

## 2020-08-20 DIAGNOSIS — E119 Type 2 diabetes mellitus without complications: Secondary | ICD-10-CM | POA: Diagnosis not present

## 2020-08-20 DIAGNOSIS — M79675 Pain in left toe(s): Secondary | ICD-10-CM | POA: Diagnosis not present

## 2020-08-21 ENCOUNTER — Encounter: Payer: Self-pay | Admitting: Podiatry

## 2020-08-21 NOTE — Progress Notes (Signed)
  Subjective:  Patient ID: Natasha [name], female    DOB: 22-Feb-1946,  MRN: [id number]  Chief Complaint  Patient presents with  . routine foot care    nail trim    74 y.o. female returns for the above complaint.  Patient presents with thickened elongated dystrophic toenails discolored x10.  Patient states there is pain to touch.  She has not been able to debride on herself other than really thick and is painful to walk.  She would like for me to do it.  She denies any other acute complaints.  She is a diabetic with last A1c of 9.0  Objective:  There were no vitals filed for this visit. Podiatric Exam: Vascular: dorsalis pedis and posterior tibial pulses are palpable bilateral. Capillary return is immediate. Temperature gradient is WNL. Skin turgor WNL  Sensorium: Normal Semmes Weinstein monofilament test. Normal tactile sensation bilaterally. Nail Exam: Pt has thick disfigured discolored nails with subungual debris noted bilateral entire nail hallux through fifth toenails.  Pain on palpation to the nails. Ulcer Exam: There is no evidence of ulcer or pre-ulcerative changes or infection. Orthopedic Exam: Muscle tone and strength are WNL. No limitations in general ROM. No crepitus or effusions noted. HAV  B/L.  Hammer toes 2-5  B/L. Skin: No Porokeratosis. No infection or ulcers    Assessment & Plan:   1. Pain due to onychomycosis of toenails of both feet   2. Controlled type 2 diabetes mellitus without complication, without long-term current use of insulin ([place])     Patient was evaluated and treated and all questions answered.  Onychomycosis with pain  -Nails palliatively debrided as below. -Educated on self-care  Procedure: Nail Debridement Rationale: pain  Type of Debridement: manual, sharp debridement. Instrumentation: Nail nipper, rotary burr. Number of Nails: 10  Procedures and Treatment: Consent by patient was obtained for treatment procedures. The patient understood the  discussion of treatment and procedures well. All questions were answered thoroughly reviewed. Debridement of mycotic and hypertrophic toenails, 1 through 5 bilateral and clearing of subungual debris. No ulceration, no infection noted.  Return Visit-Office Procedure: Patient instructed to return to the office for a follow up visit 3 months for continued evaluation and treatment.  Boneta Lucks, DPM    No follow-ups on file.

## 2020-09-08 IMAGING — PT NM PET TUM IMG INITIAL (PI) SKULL BASE T - THIGH
1 of 10 series · 1 of 25 positions shown · non-contrast
Comparison: Abdominopelvic CT 08/29/2018

CLINICAL DATA: Initial treatment strategy for low-grade lymphoma
with generalized lymphadenopathy. Biopsy 2-3 weeks ago in leg..

EXAM:
NUCLEAR MEDICINE PET SKULL BASE TO THIGH
TECHNIQUE: 9.1 mCi F-18 FDG was injected intravenously. Full-ring PET imaging
was performed from the skull base to thigh after the radiotracer. CT
data was obtained and used for attenuation correction and anatomic
localization.
Fasting blood glucose: 125 mg/dl

[Series 3: ct wb 5.0 b30f · axial · 5.0mm · 0.98mm/px · 1 of 290 slices shown]
[im 290/290  brain]
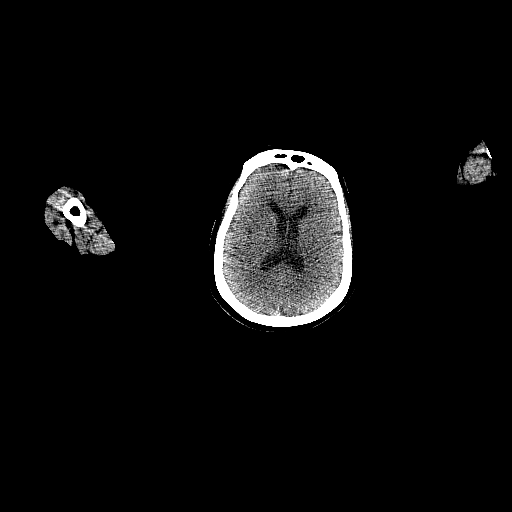

[1 of 25 positions shown; findings below may reference images not displayed]

FINDINGS: Mediastinal blood pool activity: SUV max

NECK: Left tongue base/palatine tonsil hypermetabolism corresponds
to probable soft tissue fullness including on image 39/3. This
measures a S.U.V. max of 5.0.

Hypermetabolism about the right facial musculature superficial to
the angle of the mandible is likely due to muscular activity after
radiopharmaceutical injection.

Adenopathy in the low left jugular region and supraclavicular
station, including at 10 mm and a S.U.V. max of 4.2 on image 65/3.

Incidental CT findings: none

CHEST: Bilateral hypermetabolic axillary nodes. Index left axillary
node measures 1.3 cm and a S.U.V. max of 5.2 on 76/3.

A periesophageal node measures 9 mm and a S.U.V. max of 5.3 on 96/3.

Incidental CT findings: Tiny hiatal hernia. Normal heart size. A
right upper lobe well-circumscribed non hypermetabolic pulmonary
nodule of 1.5 cm has speckled calcification and possible fat density
within on image 90/3.

ABDOMEN/PELVIS: Hypermetabolism of the spleen relative to the liver
with splenomegaly as detailed on prior CT. This measures a S.U.V.
max of 4.7.

Hypermetabolic abdominal nodes, including a portal caval node which
measures 1.9 cm and a S.U.V. max of 6.9 on 147/3.

Pelvic sidewall adenopathy, with an index right external iliac node
measuring 2.1 cm and a S.U.V. max of 11.0 on 223/3.

There is mild misregistration between CT and PET images, especially
within the pelvis. A focus of hypermetabolism which projects
posterior to the urinary bladder is therefore indeterminate,
measuring a S.U.V. max of 9.5 and in the region of the anus and
perineum including on image 249/3.

Incidental CT findings: Abdominal aortic atherosclerosis.
Gallstones. Pelvic floor laxity.

SKELETON: Multifocal osseous hypermetabolism. Example in the right
humeral head at a S.U.V. max of 5.0 without CT correlate on image
54/3. Within the T5 vertebral body at a S.U.V. max of 7.0.

Incidental CT findings: Remote left rib fractures.
IMPRESSION: 1. Active lymphoma involving the cervicothoracic junction, chest,
abdomen, pelvis, and marrow, as detailed above. Suspicion of left
tongue base/palatine tonsil involvement as evidenced by
hypermetabolism and subtle soft tissue fullness.
2. Hypermetabolism within the low pelvis, difficult to localize
secondary to misregistration between CT and PET images. Consider
physical exam correlation, including of the perineum and anus.
Presuming the physical exam is normal, this could simply represent
urinary contamination about the perineum.
3. Non hypermetabolic right upper lobe pulmonary nodule, favored to
represent a benign lesion such as a hamartoma. Recommend attention
on follow-up.
4.  Incidental findings, including cholelithiasis.

## 2020-09-16 ENCOUNTER — Telehealth (INDEPENDENT_AMBULATORY_CARE_PROVIDER_SITE_OTHER): Payer: Medicare HMO | Admitting: Family Medicine

## 2020-09-16 ENCOUNTER — Telehealth: Payer: Self-pay

## 2020-09-16 ENCOUNTER — Encounter: Payer: Self-pay | Admitting: Family Medicine

## 2020-09-16 DIAGNOSIS — J069 Acute upper respiratory infection, unspecified: Secondary | ICD-10-CM

## 2020-09-16 MED ORDER — BENZONATATE 100 MG PO CAPS: 100.0000 mg | ORAL_CAPSULE | Freq: Three times a day (TID) | ORAL | 0 refills | Status: DC | PRN

## 2020-09-16 NOTE — Patient Instructions (Signed)
Based on your symptoms, it looks like you have a virus.   Antibiotics are not need for a viral infection but the following will help:   1. Drink plenty of fluids 2. Get lots of rest  Sinus Congestion 1) Neti Pot (Saline rinse) -- 2 times day -- if tolerated 2) Flonase (Store Brand ok) - once daily 3) Over the counter congestion medications  Cough 1) Cough drops can be helpful 2) Nyquil (or nighttime cough medication) 3) Honey is proven to be one of the best cough medications  4) Cough medicine with Dextromethorphan can also be helpful  Sore Throat 1) Honey as above, cough drops 2) Ibuprofen or Aleve can be helpful 3) Salt water Gargles  If you develop fevers (Temperature >100.4), chills, worsening symptoms or symptoms lasting longer than 10 days return to clinic.    

## 2020-09-16 NOTE — Telephone Encounter (Signed)
Please see note from today 

## 2020-09-16 NOTE — Progress Notes (Signed)
    I connected with Natasha [name] on 09/16/20 at  2:40 PM EST by video and verified that I am speaking with the correct person using two identifiers.   I discussed the limitations, risks, security and privacy concerns of performing an evaluation and management service by video and the availability of in person appointments. I also discussed with the patient that there may be a patient responsible charge related to this service. The patient expressed understanding and agreed to proceed.  Patient location: Home Provider Location: [facility] Participants: Lesleigh Noe and Prentice Docker Papp   Subjective:     Natasha [name] is a 74 y.o. female presenting for Cough (x 3 weeks ) and Nasal Congestion (x 3 weeks )     Cough Associated symptoms include headaches. Pertinent negatives include no chills, ear pain, sore throat or shortness of breath.  Sinus Problem This is a new problem. The current episode started 1 to 4 weeks ago. The problem is unchanged. There has been no fever. Associated symptoms include congestion, coughing and headaches. Pertinent negatives include no chills, ear pain, shortness of breath, sinus pressure, sneezing, sore throat or swollen glands. Past treatments include nothing.   No loss of taste or smell No sick contact  No money to purchase OTC medications  Was exposed to great grandson who has RSV  Review of Systems  Constitutional: Negative for chills.  HENT: Positive for congestion. Negative for ear pain, sinus pressure, sneezing and sore throat.   Respiratory: Positive for cough. Negative for shortness of breath.   Neurological: Positive for headaches.     Social History   Tobacco Use  Smoking Status Current Every Day Smoker  . Packs/day: 1.00  . Years: 55.00  . Pack years: 55.00  . Types: Cigarettes  . Start date: 07/07/1961  Smokeless Tobacco Never Used        Objective:   BP Readings from Last 3 Encounters:  08/14/20 110/74    09/18/19 107/75  08/27/19 123/64   Wt Readings from Last 3 Encounters:  08/14/20 174 lb 12 oz (79.3 kg)  09/18/19 179 lb 14.3 oz (81.6 kg)  08/27/19 180 lb (81.6 kg)   There were no vitals taken for this visit.    Speaking in complete sentences Alert oriented        Assessment & Plan:   Problem List Items Addressed This Visit    None    Visit Diagnoses    URI with cough and congestion    -  Primary   Relevant Medications   benzonatate (TESSALON PERLES) 100 MG capsule     Suspect RSV infection given known exposure with persisting cough Cough medication prescribed Follow-up if sinus symptoms worsen or new fever   Interactive audio and video telecommunications were attempted between this provider and patient, however failed, due to patient having technical difficulties OR patient did not have access to video capability.  We continued and completed visit with audio only.   Start Time: 3:01 End Time: 3:10     No follow-ups on file.  Lesleigh Noe, MD

## 2020-09-16 NOTE — Telephone Encounter (Signed)
Pt left v/m that her great grandson had RSV and gave to pt and pt wants abx. I spoke with pt and pts great grandson was tested positive for RSV. Pt started with symptoms 2 wks ago. Pt has prod cough with gray phlegm and when pt blows her nose there is gray mucus. pt has bad H/A.  No dizziness,CP, SOB,fever, diarrhea, loss of taste or smell,S/T, runny nose or vomiting. 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 Pt is not sure when he went to the doctor and got tested. Pt scheduled video visit with Dr Einar Pheasant 09/16/20 at 2:40. Pt cannot do vital signs but has questions for CMA about video connection.

## 2020-11-28 ENCOUNTER — Encounter: Payer: Self-pay | Admitting: Podiatry

## 2020-11-28 ENCOUNTER — Ambulatory Visit (INDEPENDENT_AMBULATORY_CARE_PROVIDER_SITE_OTHER): Payer: Medicare Other | Admitting: Podiatry

## 2020-11-28 ENCOUNTER — Other Ambulatory Visit: Payer: Self-pay

## 2020-11-28 DIAGNOSIS — E119 Type 2 diabetes mellitus without complications: Secondary | ICD-10-CM | POA: Diagnosis not present

## 2020-11-28 DIAGNOSIS — M79674 Pain in right toe(s): Secondary | ICD-10-CM | POA: Diagnosis not present

## 2020-11-28 DIAGNOSIS — L84 Corns and callosities: Secondary | ICD-10-CM | POA: Diagnosis not present

## 2020-11-28 DIAGNOSIS — M79675 Pain in left toe(s): Secondary | ICD-10-CM | POA: Diagnosis not present

## 2020-11-28 DIAGNOSIS — B351 Tinea unguium: Secondary | ICD-10-CM

## 2020-12-04 NOTE — Progress Notes (Addendum)
Subjective:  Patient ID: Natasha [name], female    DOB: 12-Nov-1945,  MRN: [id number]  75 y.o. female presents with preventative diabetic foot care and painful thick toenails that are difficult to trim. Pain interferes with ambulation. Aggravating factors include wearing enclosed shoe gear. Pain is relieved with periodic professional debridement.Marland Kitchen    PCP: Lesleigh Noe, MD and last visit was: 09/16/2020  Review of Systems: Negative except as noted in the HPI.  Past Medical History:  Diagnosis Date  . Cancer [facility])    lymphoma-stomach   . 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 Diabetes mellitus without complication ([place])   . Vaginal delivery    x 5   Past Surgical History:  Procedure Laterality Date  . TONSILLECTOMY Bilateral    as a child   Patient Active Problem List   Diagnosis Date Noted  . Nail abnormality 08/14/2020  . Tobacco use 08/14/2020  . Lung cancer screening declined by patient 08/14/2020  . Post herpetic neuralgia 03/14/2020  . Gall stones 10/23/2019  . Lymphoma ([place]) 10/23/2019  . Generalized lymphadenopathy 09/08/2018  . 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 SI (sacroiliac) joint inflammation ([place]) 08/14/2018  . Controlled type 2 diabetes mellitus without complication, without long-term current use of insulin ([place]) 07/07/2018  . Primary osteoarthritis of both knees 07/07/2018  . Leg cramping 07/07/2018  . Senile purpura ([place]) 07/07/2018    Current Outpatient Medications:  .  benzonatate (TESSALON PERLES) 100 MG capsule, Take 1 capsule (100 mg total) by mouth 3 (three) times daily as needed for cough., Disp: 20 capsule, Rfl: 0 .  metFORMIN (GLUCOPHAGE) 500 MG tablet, Take 500 mg by mouth 2 (two) times daily with a meal., Disp: , Rfl:  .  pregabalin (LYRICA) 100 MG capsule, Take by mouth., Disp: , Rfl:  No Known Allergies Social History   Tobacco Use  Smoking Status Current Every Day Smoker  . Packs/day: 1.00  . Years: 55.00  . Pack years: 55.00  .  Types: Cigarettes  . Start date: 07/07/1961  Smokeless Tobacco Never Used    Objective:  There were no vitals filed for this visit. Constitutional Patient is a pleasant 74 y.o. Caucasian female in NAD. AAO x 3.  Vascular Capillary refill time to digits immediate b/l. Palpable pedal pulses b/l LE. Pedal hair sparse. Lower extremity skin temperature gradient within normal limits. No pain with calf compression b/l. No cyanosis or clubbing noted.  Neurologic Normal speech. Protective sensation intact 5/5 intact bilaterally with 10g monofilament b/l. Vibratory sensation intact b/l.  Dermatologic Pedal skin with normal turgor, texture and tone bilaterally. No open wounds bilaterally. No interdigital macerations bilaterally. Toenails 1-5 b/l elongated, discolored, dystrophic, thickened, crumbly with subungual debris and tenderness to dorsal palpation. Hyperkeratotic lesion(s) submet head 5 left foot, submet head 5 right foot and sub 5th met base left foot.  No erythema, no edema, no drainage, no fluctuance.  Orthopedic: Normal muscle strength 5/5 to all lower extremity muscle groups bilaterally. No pain crepitus or joint limitation noted with ROM b/l. Hallux valgus with bunion deformity noted b/l lower extremities. Hammertoes noted to the 2-5 bilaterally.   Assessment:   1. Pain due to onychomycosis of toenails of both feet   2. Callus   3. Controlled type 2 diabetes mellitus without complication, without long-term current use of insulin ([place])    Plan:  Patient was evaluated and treated and all questions answered.  Onychomycosis with pain -Nails palliatively debridement as below. -Educated on  self-care  Procedure: Nail Debridement Rationale: Pain Type of Debridement: manual, sharp debridement. Instrumentation: Nail nipper, rotary burr. Number of Nails: 10  -Examined patient. -Continue diabetic foot care principles. -Patient to continue soft, supportive shoe gear daily. -Toenails 1-5 b/l  were debrided in length and girth with sterile nail nippers and dremel without iatrogenic bleeding.  -Callus(es) submet head 5 left foot, submet head 5 right foot and sub 5th met base left foot pared utilizing sterile scalpel blade without complication or incident. Total number debrided =3. -Patient to report any pedal injuries to medical professional immediately. -Patient/POA to call should there be question/concern in the interim.  Return in about 3 months (around 02/25/2021).  Marzetta Board, DPM

## 2021-02-18 ENCOUNTER — Telehealth: Payer: Self-pay

## 2021-02-18 NOTE — Telephone Encounter (Signed)
Pt wanted Dr Einar Pheasant to know that medicaid tried x 2 but could not get Dr Einar Pheasant approved as pts PCP. Pt wanted DR Einar Pheasant to be aware that she would be seeing a new doctor in [place] that does take medicaid.

## 2021-02-19 NOTE — Telephone Encounter (Signed)
Thank you for the notification. Will route to manager to see what the issue is.   We are able to refill medication for 30 days to allow time to find a new PCP

## 2021-02-19 NOTE — Telephone Encounter (Signed)
Noted, will have front office investigate this further.  Need further clarification on what type of Medicaid she has.  That may be the issue.    Forwarding to [employer] to look in to.   Thanks.

## 2021-03-04 ENCOUNTER — Ambulatory Visit: Payer: Medicare Other | Admitting: Podiatry

## 2021-03-20 ENCOUNTER — Ambulatory Visit: Payer: Medicare Other | Admitting: Podiatry

## 2021-05-20 ENCOUNTER — Encounter: Payer: Self-pay | Admitting: Podiatry

## 2021-05-20 ENCOUNTER — Ambulatory Visit (INDEPENDENT_AMBULATORY_CARE_PROVIDER_SITE_OTHER): Payer: Medicare Other | Admitting: Podiatry

## 2021-05-20 ENCOUNTER — Other Ambulatory Visit: Payer: Self-pay

## 2021-05-20 DIAGNOSIS — L84 Corns and callosities: Secondary | ICD-10-CM

## 2021-05-20 DIAGNOSIS — E119 Type 2 diabetes mellitus without complications: Secondary | ICD-10-CM | POA: Diagnosis not present

## 2021-05-20 DIAGNOSIS — M79675 Pain in left toe(s): Secondary | ICD-10-CM | POA: Diagnosis not present

## 2021-05-20 DIAGNOSIS — M79674 Pain in right toe(s): Secondary | ICD-10-CM | POA: Diagnosis not present

## 2021-05-20 DIAGNOSIS — B351 Tinea unguium: Secondary | ICD-10-CM | POA: Diagnosis not present

## 2021-05-23 NOTE — Progress Notes (Signed)
Subjective: Tekeshia Klahr Macari is a pleasant 75 y.o. female patient seen today for preventative diabetic foot care. She has  painful thick toenails that are difficult to trim. Pain interferes with ambulation. Aggravating factors include wearing enclosed shoe gear. Pain is relieved with periodic professional debridement.  Patient states she stubbed her right foot on 03/11/2021. She went to Urgent Care and was told her right 2nd toe was fractured. She was given ibuprofen 800 mg. She was to follow up with Ortho at [facility] in [place]. She did not follow up, but states digits feel better.  PCP is Lesleigh Noe, MD. Last visit was: 09/16/2020.  No Known Allergies  Objective: Physical Exam  General: Jameriah Trotti Cedeno is a pleasant 75 y.o. Caucasian female, in NAD. AAO x 3.   Vascular:  Capillary refill time to digits immediate b/l. Palpable pedal pulses b/l LE. Pedal hair sparse. Lower extremity skin temperature gradient within normal limits. No pain with calf compression b/l.  Dermatological:  Pedal skin with normal turgor, texture and tone b/l lower extremities. Toenails 1-5 b/l elongated, discolored, dystrophic, thickened, crumbly with subungual debris and tenderness to dorsal palpation. Hyperkeratotic lesion(s) submet head 5 left foot, submet head 5 right foot, and sub 5th met base left foot.  No erythema, no edema, no drainage, no fluctuance.  Musculoskeletal:  Normal muscle strength 5/5 to all lower extremity muscle groups bilaterally. No pain crepitus or joint limitation noted with ROM 2-5 bilaterally. No gross bony deformities b/l lower extremities.  Neurological:  Protective sensation intact 5/5 intact bilaterally with 10g monofilament b/l.  Assessment and Plan:  1. Pain due to onychomycosis of toenails of both feet   2. Callus   3. Controlled type 2 diabetes mellitus without complication, without long-term current use of insulin ([place])      -Examined patient. -Continue  diabetic foot care principles. -Patient to continue soft, supportive shoe gear daily. -Toenails 1-5 b/l were debrided in length and girth with sterile nail nippers and dremel without iatrogenic bleeding.  -Callus(es) submet head 5 left foot, submet head 5 right foot, and sub 5th met base left foot pared utilizing sterile scalpel blade without complication or incident. Total number debrided =3. -Patient to report any pedal injuries to medical professional immediately. -Patient/POA to call should there be question/concern in the interim.  Return in about 3 months (around 08/20/2021).  Marzetta Board, DPM

## 2021-05-25 ENCOUNTER — Encounter: Payer: Self-pay | Admitting: Nurse Practitioner

## 2021-05-25 ENCOUNTER — Other Ambulatory Visit: Payer: Self-pay

## 2021-05-25 ENCOUNTER — Ambulatory Visit (INDEPENDENT_AMBULATORY_CARE_PROVIDER_SITE_OTHER): Payer: Medicare Other | Admitting: Nurse Practitioner

## 2021-05-25 VITALS — BP 104/71 | HR 81 | Temp 98.4°F | Ht 63.11 in | Wt 168.4 lb

## 2021-05-25 DIAGNOSIS — L989 Disorder of the skin and subcutaneous tissue, unspecified: Secondary | ICD-10-CM | POA: Diagnosis not present

## 2021-05-25 DIAGNOSIS — Z1231 Encounter for screening mammogram for malignant neoplasm of breast: Secondary | ICD-10-CM

## 2021-05-25 DIAGNOSIS — Z7689 Persons encountering health services in other specified circumstances: Secondary | ICD-10-CM

## 2021-05-25 DIAGNOSIS — Z13820 Encounter for screening for osteoporosis: Secondary | ICD-10-CM | POA: Diagnosis not present

## 2021-05-25 DIAGNOSIS — R053 Chronic cough: Secondary | ICD-10-CM

## 2021-05-25 DIAGNOSIS — E119 Type 2 diabetes mellitus without complications: Secondary | ICD-10-CM | POA: Diagnosis not present

## 2021-05-25 MED ORDER — AZITHROMYCIN 250 MG PO TABS: ORAL_TABLET | ORAL | 0 refills | Status: AC

## 2021-05-25 NOTE — Assessment & Plan Note (Signed)
Chronic. Controlled. Last A1c 5.8. Controlled with diet and not currently on medication.  Will make recommendations based on lab results.

## 2021-05-25 NOTE — Progress Notes (Signed)
BP 104/71   Pulse 81   Temp 98.4 F (36.9 C)   Ht 5' 3.11" (1.603 m)   Wt 168 lb 6 oz (76.4 kg)   SpO2 96%   BMI 29.72 kg/m    Subjective:    Patient ID: Natasha [name], female    DOB: 07-19-1946, 75 y.o.   MRN: [id number]  HPI: Natasha [name] is a 75 y.o. female  Chief Complaint  Patient presents with   Establish Care   Cough    Patient states that her great grandchildren had a productive cough that she got from them, she would like something to help this   Patient presents to clinic to establish care with new PCP.  Patient reports a history of Diabetes.  Patient denies a history of: Hypertension, Elevated Cholesterol, Thyroid problems, Depression, Anxiety, Neurological problems, and Abdominal problems.   Patient states she watches her grandson and he had a cough and gave it to her.  Patient states she now has a productive cough that has been going on for about 4 months.  She was given tessalon/benzonate which hasn't helped her.  Patient states she is a current everyday smoker. She smokes about 1 ppd. Patient states she coughs mostly in the morning and some at night.  COPD status:  unknown Satisfied with current treatment?:  no current treatment Oxygen use: no Dyspnea frequency: none Cough frequency: daily. 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   Nail abnormality 08/14/2020   Post herpetic neuralgia 03/14/2020    Tobacco use 08/14/2020   Lung cancer screening declined by patient 08/14/2020   Gall stones 10/23/2019   Lymphoma ([place]) 10/23/2019   Resolved Ambulatory Problems    Diagnosis Date Noted   No Resolved Ambulatory Problems   Past Medical History:  Diagnosis Date   Cancer ([place])    Cancer ([place])    Diabetes mellitus without complication ([place])    Vaginal delivery    Family History  Problem Relation Age of Onset   Breast cancer Mother    Alzheimer's disease Father    Lung cancer Father        lung   Varicose Veins Brother    Heart attack Brother    Past Surgical History:  Procedure Laterality Date   TONSILLECTOMY Bilateral    as a child     Relevant past medical, surgical, family and social history reviewed and updated as indicated. Interim medical history since our last visit reviewed. Allergies and medications reviewed and updated.  Review of Systems  Eyes:  Negative for visual disturbance.  Respiratory:  Positive for cough. Negative for chest tightness and shortness of breath.   Cardiovascular:  Negative for chest pain, palpitations and leg swelling.  Skin:        Bumps on face  Neurological:  Negative for dizziness and headaches.   Per HPI unless specifically indicated above     Objective:    BP 104/71   Pulse 81  Temp 98.4 F (36.9 C)   Ht 5' 3.11" (1.603 m)   Wt 168 lb 6 oz (76.4 kg)   SpO2 96%   BMI 29.72 kg/m   Wt Readings from Last 3 Encounters:  05/25/21 168 lb 6 oz (76.4 kg)  08/14/20 174 lb 12 oz (79.3 kg)  09/18/19 179 lb 14.3 oz (81.6 kg)    Physical Exam Vitals and nursing note reviewed.  Constitutional:      General: She is not in acute distress.    Appearance: Normal appearance. She is normal weight. She is not ill-appearing, toxic-appearing or diaphoretic.  HENT:     Head: Normocephalic.     Right Ear: External ear normal.     Left Ear: External ear normal.     Nose: Nose normal.     Mouth/Throat:     Mouth: Mucous membranes are  moist.     Pharynx: Oropharynx is clear.  Eyes:     General:        Right eye: No discharge.        Left eye: No discharge.     Extraocular Movements: Extraocular movements intact.     Conjunctiva/sclera: Conjunctivae normal.     Pupils: Pupils are equal, round, and reactive to light.  Cardiovascular:     Rate and Rhythm: Normal rate and regular rhythm.     Heart sounds: No murmur heard. Pulmonary:     Effort: Pulmonary effort is normal. No respiratory distress.     Breath sounds: Normal breath sounds. No wheezing or rales.  Musculoskeletal:     Cervical back: Normal range of motion and neck supple.  Skin:    General: Skin is warm and dry.     Capillary Refill: Capillary refill takes less than 2 seconds.       Neurological:     General: No focal deficit present.     Mental Status: She is alert and oriented to person, place, and time. Mental status is at baseline.  Psychiatric:        Mood and Affect: Mood normal.        Behavior: Behavior normal.        Thought Content: Thought content normal.        Judgment: Judgment normal.    Results for orders placed or performed during the hospital encounter of 09/18/19  Glucose, capillary  Result Value Ref Range   Glucose-Capillary 292 (H) 70 - 99 mg/dL  CBC with Differential  Result Value Ref Range   WBC 5.3 4.0 - 10.5 K/uL   RBC 4.61 3.87 - 5.11 MIL/uL   Hemoglobin 13.3 12.0 - 15.0 g/dL   HCT 38.8 36.0 - 46.0 %   MCV 84.2 80.0 - 100.0 fL   MCH 28.9 26.0 - 34.0 pg   MCHC 34.3 30.0 - 36.0 g/dL   RDW 14.0 11.5 - 15.5 %   Platelets 157 150 - 400 K/uL   nRBC 0.0 0.0 - 0.2 %   Neutrophils Relative % 64 %   Neutro Abs 3.4 1.7 - 7.7 K/uL   Lymphocytes Relative 20 %   Lymphs Abs 1.0 0.7 - 4.0 K/uL   Monocytes Relative 10 %   Monocytes Absolute 0.5 0.1 - 1.0 K/uL   Eosinophils Relative 4 %   Eosinophils Absolute 0.2 0.0 - 0.5 K/uL   Basophils Relative 1 %   Basophils Absolute 0.0 0.0 - 0.1 K/uL   Immature Granulocytes 1 %    Abs Immature Granulocytes 0.03 0.00 -  0.07 K/uL  Basic metabolic panel  Result Value Ref Range   Sodium 140 135 - 145 mmol/L   Potassium 3.7 3.5 - 5.1 mmol/L   Chloride 103 98 - 111 mmol/L   CO2 28 22 - 32 mmol/L   Glucose, Bld 338 (H) 70 - 99 mg/dL   BUN 23 8 - 23 mg/dL   Creatinine, Ser 0.73 0.44 - 1.00 mg/dL   Calcium 9.3 8.9 - 10.3 mg/dL   GFR calc non Af Amer >60 >60 mL/min   GFR calc Af Amer >60 >60 mL/min   Anion gap 9 5 - 15      Assessment & Plan:   Problem List Items Addressed This Visit       Endocrine   Controlled type 2 diabetes mellitus without complication, without long-term current use of insulin ([facility]) - Primary    Chronic. Controlled. Last A1c 5.8. Controlled with diet and not currently on medication.  Will make recommendations based on lab results.       Relevant Orders   Comp Met (CMET)   HgB A1c   Other Visit Diagnoses     Chronic cough       Azithromycin given. Chest xray ordered. If unable to find cause of cough will refer to pulmonolgy.   Relevant Orders   DG Chest 2 View   Face lesion       Recommend patient see Dermatology for further evaluation and treatment.    Relevant Orders   Ambulatory referral to Dermatology   Screening for osteoporosis       Relevant Orders   DG Bone Density   Encounter for screening mammogram for malignant neoplasm of breast       Relevant Orders   MM DIGITAL SCREENING BILATERAL   Encounter to establish care            Follow up plan: Return in about 3 months (around 08/25/2021) for HTN, HLD, DM2 FU.

## 2021-05-26 LAB — HEMOGLOBIN A1C
Est. average glucose Bld gHb Est-mCnc: 123 mg/dL
Hgb A1c MFr Bld: 5.9 % — ABNORMAL HIGH (ref 4.8–5.6)

## 2021-05-26 LAB — COMPREHENSIVE METABOLIC PANEL
ALT: 13 IU/L (ref 0–32)
AST: 14 IU/L (ref 0–40)
Albumin/Globulin Ratio: 3.6 — ABNORMAL HIGH (ref 1.2–2.2)
Albumin: 4.3 g/dL (ref 3.7–4.7)
Alkaline Phosphatase: 128 IU/L — ABNORMAL HIGH (ref 44–121)
BUN/Creatinine Ratio: 32 — ABNORMAL HIGH (ref 12–28)
BUN: 27 mg/dL (ref 8–27)
Bilirubin Total: 0.5 mg/dL (ref 0.0–1.2)
CO2: 25 mmol/L (ref 20–29)
Calcium: 9.2 mg/dL (ref 8.7–10.3)
Chloride: 107 mmol/L — ABNORMAL HIGH (ref 96–106)
Creatinine, Ser: 0.85 mg/dL (ref 0.57–1.00)
Globulin, Total: 1.2 g/dL — ABNORMAL LOW (ref 1.5–4.5)
Glucose: 168 mg/dL — ABNORMAL HIGH (ref 65–99)
Potassium: 4.6 mmol/L (ref 3.5–5.2)
Sodium: 144 mmol/L (ref 134–144)
Total Protein: 5.5 g/dL — ABNORMAL LOW (ref 6.0–8.5)
eGFR: 72 mL/min/{1.73_m2} (ref >59)

## 2021-05-26 NOTE — Progress Notes (Signed)
Hi Natasha [name].  Your lab work looks good.  Your liver enzymes are elevated.  We will continue to monitor in the future.  A1c is well controlled at 5.9.  Keep up the good work.  Please let me know if you have any questions.

## 2021-06-01 ENCOUNTER — Encounter: Payer: Medicare HMO | Admitting: Family Medicine

## 2021-07-06 DIAGNOSIS — G43009 Migraine without aura, not intractable, without status migrainosus: Secondary | ICD-10-CM | POA: Diagnosis not present

## 2021-07-06 DIAGNOSIS — S61002A Unspecified open wound of left thumb without damage to nail, initial encounter: Secondary | ICD-10-CM | POA: Diagnosis not present

## 2021-07-06 DIAGNOSIS — Z23 Encounter for immunization: Secondary | ICD-10-CM | POA: Diagnosis not present

## 2021-07-06 DIAGNOSIS — L089 Local infection of the skin and subcutaneous tissue, unspecified: Secondary | ICD-10-CM | POA: Diagnosis not present

## 2021-07-06 DIAGNOSIS — M189 Osteoarthritis of first carpometacarpal joint, unspecified: Secondary | ICD-10-CM | POA: Diagnosis not present

## 2021-07-06 DIAGNOSIS — B0229 Other postherpetic nervous system involvement: Secondary | ICD-10-CM | POA: Diagnosis not present

## 2021-07-06 DIAGNOSIS — S61012A Laceration without foreign body of left thumb without damage to nail, initial encounter: Secondary | ICD-10-CM | POA: Diagnosis not present

## 2021-08-21 ENCOUNTER — Ambulatory Visit (INDEPENDENT_AMBULATORY_CARE_PROVIDER_SITE_OTHER): Payer: Medicare Other | Admitting: Podiatry

## 2021-08-21 ENCOUNTER — Other Ambulatory Visit: Payer: Self-pay

## 2021-08-21 ENCOUNTER — Encounter: Payer: Self-pay | Admitting: Podiatry

## 2021-08-21 DIAGNOSIS — E119 Type 2 diabetes mellitus without complications: Secondary | ICD-10-CM | POA: Diagnosis not present

## 2021-08-21 DIAGNOSIS — M79675 Pain in left toe(s): Secondary | ICD-10-CM

## 2021-08-21 DIAGNOSIS — M2042 Other hammer toe(s) (acquired), left foot: Secondary | ICD-10-CM

## 2021-08-21 DIAGNOSIS — B351 Tinea unguium: Secondary | ICD-10-CM | POA: Diagnosis not present

## 2021-08-21 DIAGNOSIS — M2041 Other hammer toe(s) (acquired), right foot: Secondary | ICD-10-CM

## 2021-08-21 DIAGNOSIS — M79674 Pain in right toe(s): Secondary | ICD-10-CM | POA: Diagnosis not present

## 2021-08-25 ENCOUNTER — Encounter: Payer: Self-pay | Admitting: Nurse Practitioner

## 2021-08-25 ENCOUNTER — Other Ambulatory Visit: Payer: Self-pay

## 2021-08-25 ENCOUNTER — Ambulatory Visit (INDEPENDENT_AMBULATORY_CARE_PROVIDER_SITE_OTHER): Payer: Medicare Other | Admitting: Nurse Practitioner

## 2021-08-25 VITALS — BP 116/67 | HR 82 | Temp 97.9°F | Ht 63.0 in | Wt 169.6 lb

## 2021-08-25 DIAGNOSIS — B0229 Other postherpetic nervous system involvement: Secondary | ICD-10-CM | POA: Diagnosis not present

## 2021-08-25 DIAGNOSIS — E119 Type 2 diabetes mellitus without complications: Secondary | ICD-10-CM

## 2021-08-25 MED ORDER — PREGABALIN 150 MG PO CAPS
150.0000 mg | ORAL_CAPSULE | Freq: Two times a day (BID) | ORAL | 0 refills | Status: DC
Start: 2021-08-25 — End: 2022-04-09

## 2021-08-25 NOTE — Progress Notes (Signed)
  Subjective:  Patient ID: Natasha [name], female    DOB: [birth year],  MRN: [id number]  Natasha Docker Alexie presents to clinic today for for annual diabetic foot examination and callus(es) b/l feet and painful thick toenails that are difficult to trim. Painful toenails interfere with ambulation. Aggravating factors include wearing enclosed shoe gear. Pain is relieved with periodic professional debridement. Painful calluses are aggravated when weightbearing with and without shoegear. Pain is relieved with periodic professional debridement.  Patient states she tripped over her purse strap on yesterday. She suspects she reinjured her right 2nd toe. States digit is not painful or swollen.   Patient does not monitor blood glucose daily.  PCP is Jon Billings, NP , and last visit was 05/25/2021.  No Known Allergies  Review of Systems: Negative except as noted in the HPI. Objective:   Constitutional Liani Caris Dingwall is a pleasant 75 y.o. Caucasian female, WD, WN in NAD. AAO x 3.   Vascular CFT immediate b/l LE. Palpable DP/PT pulses b/l LE. Digital hair sparse b/l. Skin temperature gradient WNL b/l. No pain with calf compression b/l. No edema noted b/l. Lower extremity skin temperature gradient within normal limits. No cyanosis or clubbing noted.  Neurologic Normal speech. Oriented to person, place, and time. Protective sensation intact 5/5 intact bilaterally with 10g monofilament b/l. Vibratory sensation intact b/l.  Dermatologic Pedal skin with normal turgor, texture and tone b/l lower extremities. No open wounds b/l lower extremities. No interdigital macerations noted b/l LE. Toenails 1-5 b/l elongated, discolored, dystrophic, thickened, crumbly with subungual debris and tenderness to dorsal palpation. Hyperkeratotic lesion(s) submet head 1 b/l, submet head 2 b/l, submet head 3 b/l, submet head 4 b/l, and submet head 5 b/l.  No erythema, no edema, no drainage, no fluctuance.  Orthopedic: Normal muscle  strength 5/5 to all lower extremity muscle groups bilaterally. No edema nor ecchymosis of right 2nd digit. Hammertoe deformity noted 2-5 b/l.   Radiographs: None Assessment:   1. Pain due to onychomycosis of toenails of both feet   2. Controlled type 2 diabetes mellitus without complication, without long-term current use of insulin ([place])   3. Acquired hammertoes of both feet   4. Encounter for diabetic foot exam [facility])    Plan:  Patient was evaluated and treated and all questions answered. Consent given for treatment as described below: -Medicaid ABN signed for this year. Patient refuses services of paring of calluses bilaterally on today. Copy has been placed in patient chart. -Discussed injury to right 2nd toe. She declines xray on today's visit. -Diabetic foot examination performed today. -Continue diabetic foot care principles: inspect feet daily, monitor glucose as recommended by PCP and/or Endocrinologist, and follow prescribed diet per PCP, Endocrinologist and/or dietician. -Toenails 1-5 b/l were debrided in length and girth with sterile nail nippers and dremel without iatrogenic bleeding.  -Patient/POA to call should there be question/concern in the interim.  Return in about 3 months (around 11/21/2021).  Marzetta Board, DPM

## 2021-08-25 NOTE — Assessment & Plan Note (Signed)
Chronic. Patient states Lyrica was helping some but not completely.  Will increase Lyrica to 150mg  BID. Follow up in 3 months for reevaluation.

## 2021-08-25 NOTE — Progress Notes (Signed)
BP 116/67   Pulse 82   Temp 97.9 F (36.6 C) (Oral)   Ht _0  (1.6 m)   Wt 169 lb 9.6 oz (76.9 kg)   SpO2 98%   BMI 30.04 kg/m    Subjective:    Patient ID: Natasha [name], female    DOB: [birth year], 75 y.o.   MRN: [id number]  HPI: Natasha [name] is a 75 y.o. female  Chief Complaint  Patient presents with   Diabetes    Pt states she had an eye exam within the last year but does not know where she went to. States she has the papers at home, requested patient to bring them to next visit   Hyperlipidemia   Hypertension   DIABETES Hypoglycemic episodes:no Polydipsia/polyuria: no Visual disturbance: no Chest pain: no Paresthesias: no Glucose Monitoring: no  Accucheck frequency: Not Checking  Fasting glucose:  Post prandial:  Evening:  Before meals: Taking Insulin?: no  Long acting insulin:  Short acting insulin: Blood Pressure Monitoring: not checking Retinal Examination:  Up to date. Will bring information to next visit Foot Exam: Up to Date Diabetic Education: Not Completed Pneumovax:  Refused Influenza:  Refused Aspirin: no  Patient has stopped all of her medications.  She does not feel like any of them were working.  States she doesn't know why she was taking the elavil.  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  States it was helping some but not a lot.    Relevant past medical, surgical, family and social history reviewed and updated as indicated. Interim medical history since our last visit reviewed. Allergies and medications reviewed and updated.  Review of Systems  Musculoskeletal:        Leg pain   Per HPI unless specifically indicated above     Objective:    BP 116/67   Pulse 82   Temp 97.9 F (36.6 C) (Oral)   Ht _1  (1.6 m)   Wt 169 lb 9.6 oz (76.9 kg)   SpO2 98%   BMI 30.04 kg/m   Wt Readings from Last 3 Encounters:  08/25/21 169 lb 9.6 oz (76.9 kg)  05/25/21 168 lb 6 oz (76.4 kg)  08/14/20 174 lb 12 oz (79.3  kg)    Physical Exam Vitals and nursing note reviewed.  Constitutional:      General: She is not in acute distress.    Appearance: Normal appearance. She is normal weight. She is not ill-appearing, toxic-appearing or diaphoretic.  HENT:     Head: Normocephalic.     Right Ear: External ear normal.     Left Ear: External ear normal.     Nose: Nose normal.     Mouth/Throat:     Mouth: Mucous membranes are moist.     Pharynx: Oropharynx is clear.  Eyes:     General:        Right eye: No discharge.        Left eye: No discharge.     Extraocular Movements: Extraocular movements intact.     Conjunctiva/sclera: Conjunctivae normal.     Pupils: Pupils are equal, round, and reactive to light.  Cardiovascular:     Rate and Rhythm: Normal rate and regular rhythm.     Heart sounds: No murmur heard. Pulmonary:     Effort: Pulmonary effort is normal. No respiratory distress.     Breath sounds: Normal breath sounds. No wheezing or rales.  Musculoskeletal:     Cervical back:  Normal range of motion and neck supple.  Skin:    General: Skin is warm and dry.     Capillary Refill: Capillary refill takes less than 2 seconds.  Neurological:     General: No focal deficit present.     Mental Status: She is alert and oriented to person, place, and time. Mental status is at baseline.  Psychiatric:        Mood and Affect: Mood normal.        Behavior: Behavior normal.        Thought Content: Thought content normal.        Judgment: Judgment normal.    Results for orders placed or performed in visit on 05/25/21  Comp Met (CMET)  Result Value Ref Range   Glucose 168 (H) 65 - 99 mg/dL   BUN 27 8 - 27 mg/dL   Creatinine, Ser 0.85 0.57 - 1.00 mg/dL   eGFR 72 >59 mL/min/1.73   BUN/Creatinine Ratio 32 (H) 12 - 28   Sodium 144 134 - 144 mmol/L   Potassium 4.6 3.5 - 5.2 mmol/L   Chloride 107 (H) 96 - 106 mmol/L   CO2 25 20 - 29 mmol/L   Calcium 9.2 8.7 - 10.3 mg/dL   Total Protein 5.5 (L) 6.0 - 8.5  g/dL   Albumin 4.3 3.7 - 4.7 g/dL   Globulin, Total 1.2 (L) 1.5 - 4.5 g/dL   Albumin/Globulin Ratio 3.6 (H) 1.2 - 2.2   Bilirubin Total 0.5 0.0 - 1.2 mg/dL   Alkaline Phosphatase 128 (H) 44 - 121 IU/L   AST 14 0 - 40 IU/L   ALT 13 0 - 32 IU/L  HgB A1c  Result Value Ref Range   Hgb A1c MFr Bld 5.9 (H) 4.8 - 5.6 %   Est. average glucose Bld gHb Est-mCnc 123 mg/dL      Assessment & Plan:   Problem List Items Addressed This Visit       Endocrine   Controlled type 2 diabetes mellitus without complication, without long-term current use of insulin ([facility]) - Primary    Chronic.  Controlled with diet.  Has not been taking her Metformin.  Does not want to restart.  Would like to control her diabetes with diet.  Labs ordered today.  Return to clinic in 3 months for reevaluation.  Call sooner if concerns arise.  Discussed importance of Statin and ACE/ARB for prevention purposes.  Patient declines both.  Will draw labs at next visit.          Nervous and Auditory   Post herpetic neuralgia    Chronic. Patient states Lyrica was helping some but not completely.  Will increase Lyrica to 169m BID. Follow up in 3 months for reevaluation.      Other Visit Diagnoses     Screening for ischemic heart disease            Follow up plan: Return in about 3 months (around 11/25/2021) for HTN, HLD, DM2 FU.

## 2021-08-25 NOTE — Assessment & Plan Note (Addendum)
Chronic.  Controlled with diet.  Has not been taking her Metformin.  Does not want to restart.  Would like to control her diabetes with diet.  Labs ordered today.  Return to clinic in 3 months for reevaluation.  Call sooner if concerns arise.  Discussed importance of Statin and ACE/ARB for prevention purposes.  Patient declines both.  Will draw labs at next visit.

## 2021-09-07 ENCOUNTER — Telehealth: Payer: Self-pay | Admitting: Nurse Practitioner

## 2021-09-07 NOTE — Telephone Encounter (Signed)
Copied from [place] [id number]. Topic: Medicare AWV >> Sep 07, 2021 11:22 AM Lavonia Drafts wrote: Reason for CRM:  Left message for patient to call back and schedule Medicare Annual Wellness Visit (AWV) to be done virtually or by telephone.  No hx of AWV eligible as of 10/25/09  Please schedule at anytime with [facility] Advisor.      57 Minutes appointment   Any questions, please call me at [phone]

## 2021-10-23 ENCOUNTER — Telehealth: Payer: Self-pay | Admitting: Nurse Practitioner

## 2021-10-23 NOTE — Telephone Encounter (Signed)
Copied from [place] [phone]. Topic: Medicare AWV >> Oct 23, 2021 10:04 AM Lavonia Drafts wrote: Reason for CRM:  N/A unable to leave a message for patient to call back and schedule Medicare Annual Wellness Visit (AWV) to be done virtually or by telephone.  No hx of AWV eligible as of 09/07/21  Please schedule at anytime with [facility] Health Advisor.      58 Minutes appointment   Any questions, please call me at [phone]

## 2021-11-25 ENCOUNTER — Ambulatory Visit: Payer: Medicare Other | Admitting: Nurse Practitioner

## 2021-11-25 NOTE — Progress Notes (Deleted)
There were no vitals taken for this visit.   Subjective:    Patient ID: Natasha [name], female    DOB: 06-26-1946, 76 y.o.   MRN: [id number]  HPI: Natasha [name] is a 76 y.o. female  No chief complaint on file.  DIABETES Hypoglycemic episodes:no Polydipsia/polyuria: no Visual disturbance: no Chest pain: no Paresthesias: no Glucose Monitoring: no  Accucheck frequency: Not Checking  Fasting glucose:  Post prandial:  Evening:  Before meals: Taking Insulin?: no  Long acting insulin:  Short acting insulin: Blood Pressure Monitoring: not checking Retinal Examination:  Up to date. Will bring information to next visit Foot Exam: Up to Date Diabetic Education: Not Completed Pneumovax:  Refused Influenza:  Refused Aspirin: no  Patient has stopped all of her medications.  She does not feel like any of them were working.  States she doesn't know why she was taking the elavil.  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  States it was helping some but not a lot.    Relevant past medical, surgical, family and social history reviewed and updated as indicated. Interim medical history since our last visit reviewed. Allergies and medications reviewed and updated.  Review of Systems  Musculoskeletal:        Leg pain   Per HPI unless specifically indicated above     Objective:    There were no vitals taken for this visit.  Wt Readings from Last 3 Encounters:  08/25/21 169 lb 9.6 oz (76.9 kg)  05/25/21 168 lb 6 oz (76.4 kg)  08/14/20 174 lb 12 oz (79.3 kg)    Physical Exam Vitals and nursing note reviewed.  Constitutional:      General: She is not in acute distress.    Appearance: Normal appearance. She is normal weight. She is not ill-appearing, toxic-appearing or diaphoretic.  HENT:     Head: Normocephalic.     Right Ear: External ear normal.     Left Ear: External ear normal.     Nose: Nose normal.     Mouth/Throat:     Mouth: Mucous membranes are  moist.     Pharynx: Oropharynx is clear.  Eyes:     General:        Right eye: No discharge.        Left eye: No discharge.     Extraocular Movements: Extraocular movements intact.     Conjunctiva/sclera: Conjunctivae normal.     Pupils: Pupils are equal, round, and reactive to light.  Cardiovascular:     Rate and Rhythm: Normal rate and regular rhythm.     Heart sounds: No murmur heard. Pulmonary:     Effort: Pulmonary effort is normal. No respiratory distress.     Breath sounds: Normal breath sounds. No wheezing or rales.  Musculoskeletal:     Cervical back: Normal range of motion and neck supple.  Skin:    General: Skin is warm and dry.     Capillary Refill: Capillary refill takes less than 2 seconds.  Neurological:     General: No focal deficit present.     Mental Status: She is alert and oriented to person, place, and time. Mental status is at baseline.  Psychiatric:        Mood and Affect: Mood normal.        Behavior: Behavior normal.        Thought Content: Thought content normal.        Judgment: Judgment normal.  Results for orders placed or performed in visit on 05/25/21  Comp Met (CMET)  Result Value Ref Range   Glucose 168 (H) 65 - 99 mg/dL   BUN 27 8 - 27 mg/dL   Creatinine, Ser 0.85 0.57 - 1.00 mg/dL   eGFR 72 >59 mL/min/1.73   BUN/Creatinine Ratio 32 (H) 12 - 28   Sodium 144 134 - 144 mmol/L   Potassium 4.6 3.5 - 5.2 mmol/L   Chloride 107 (H) 96 - 106 mmol/L   CO2 25 20 - 29 mmol/L   Calcium 9.2 8.7 - 10.3 mg/dL   Total Protein 5.5 (L) 6.0 - 8.5 g/dL   Albumin 4.3 3.7 - 4.7 g/dL   Globulin, Total 1.2 (L) 1.5 - 4.5 g/dL   Albumin/Globulin Ratio 3.6 (H) 1.2 - 2.2   Bilirubin Total 0.5 0.0 - 1.2 mg/dL   Alkaline Phosphatase 128 (H) 44 - 121 IU/L   AST 14 0 - 40 IU/L   ALT 13 0 - 32 IU/L  HgB A1c  Result Value Ref Range   Hgb A1c MFr Bld 5.9 (H) 4.8 - 5.6 %   Est. average glucose Bld gHb Est-mCnc 123 mg/dL      Assessment & Plan:   Problem List  Items Addressed This Visit       Cardiovascular and Mediastinum   Senile purpura ([place])     Endocrine   Controlled type 2 diabetes mellitus without complication, without long-term current use of insulin ([place]) - Primary     Follow up plan: No follow-ups on file.

## 2021-11-29 IMAGING — MR MR LUMBAR SPINE W/O CM
2 series · 36 of 48 positions shown · non-contrast
Comparison: Correlation made with CT abdomen 08/29/2018

CLINICAL DATA: Acute low back pain

EXAM:
MRI LUMBAR SPINE WITHOUT CONTRAST
TECHNIQUE: Multiplanar, multisequence MR imaging of the lumbar spine was
performed. No intravenous contrast was administered.

[Series 8: T2 · axial · 4.0mm · 0.78mm/px · z∈[-49,+148]mm · 24 of 34 slices shown]
[im 1/34]
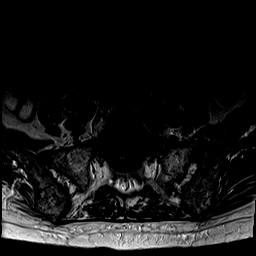
[im 2/34]
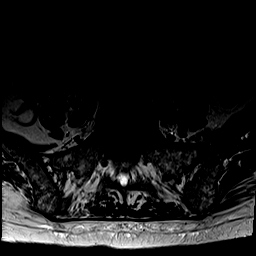
[im 3/34]
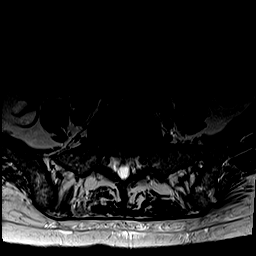
[im 5/34]
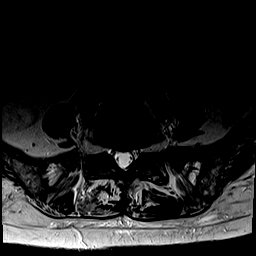
[im 6/34]
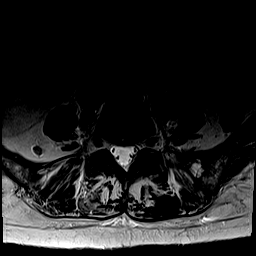
[im 8/34]
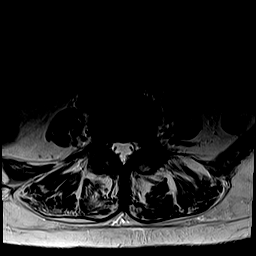
[im 9/34]
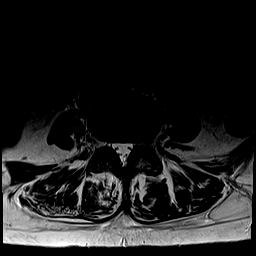
[im 11/34]
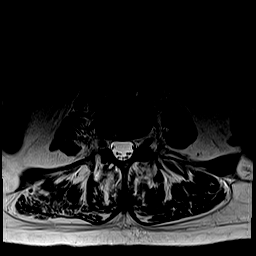
[im 12/34]
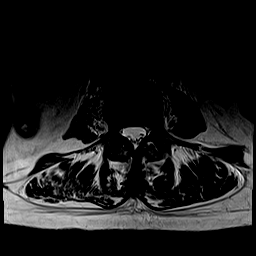
[im 13/34]
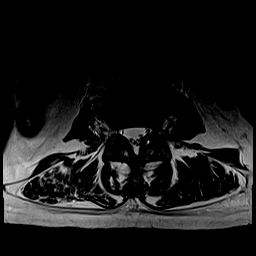
[im 15/34]
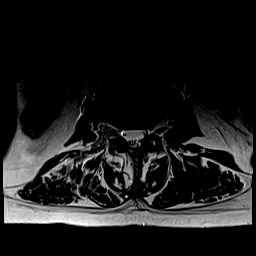
[im 16/34]
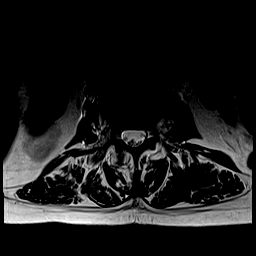
[im 18/34]
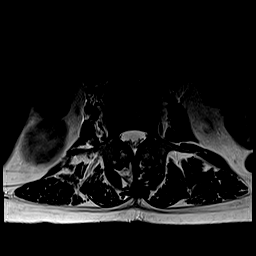
[im 19/34]
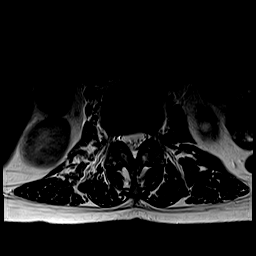
[im 21/34]
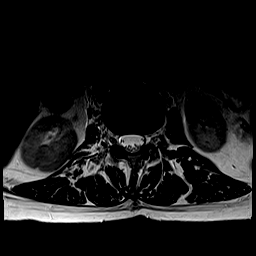
[im 22/34]
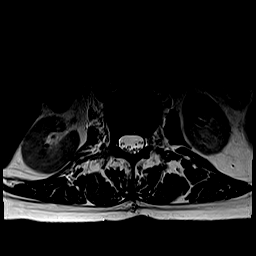
[im 23/34]
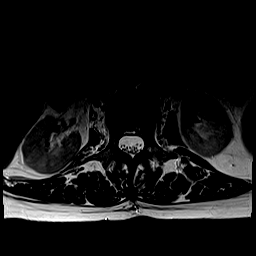
[im 25/34]
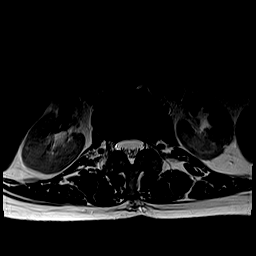
[im 26/34]
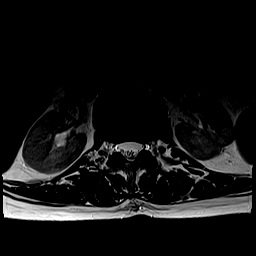
[im 28/34]
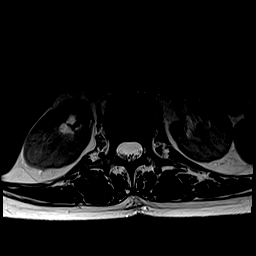
[im 29/34]
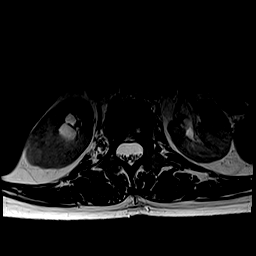
[im 31/34]
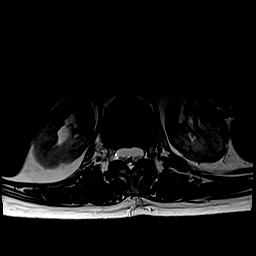
[im 32/34]
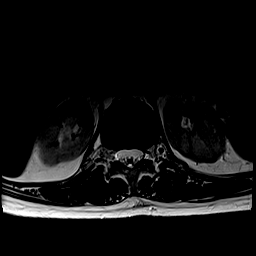
[im 34/34]
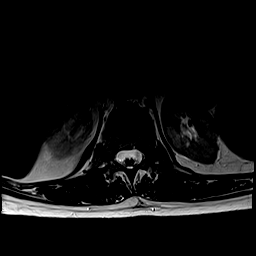

[Series 9: T1 · axial · 4.0mm · 0.39mm/px · z∈[-49,+139]mm · 12 of 34 slices shown]
[im 1/34]
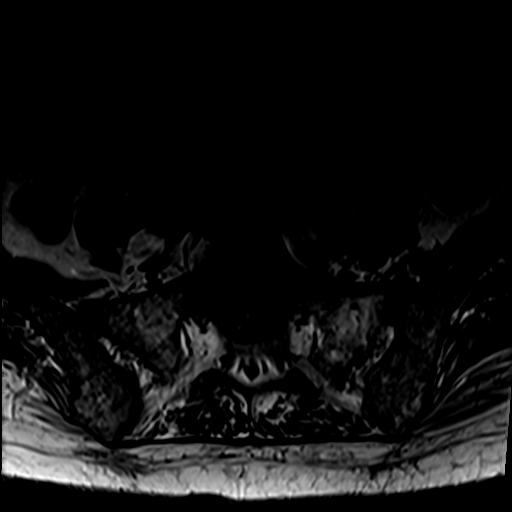
[im 2/34]
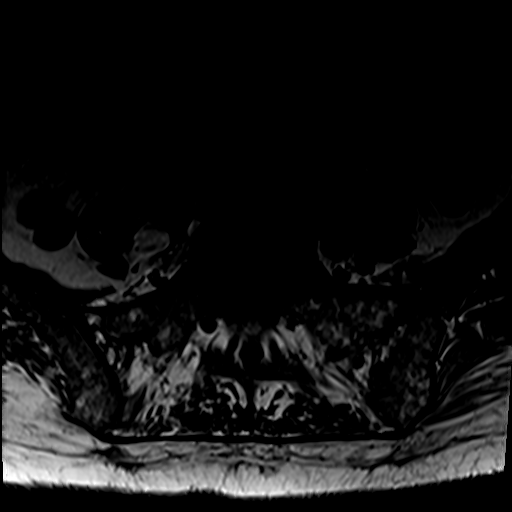
[im 3/34]
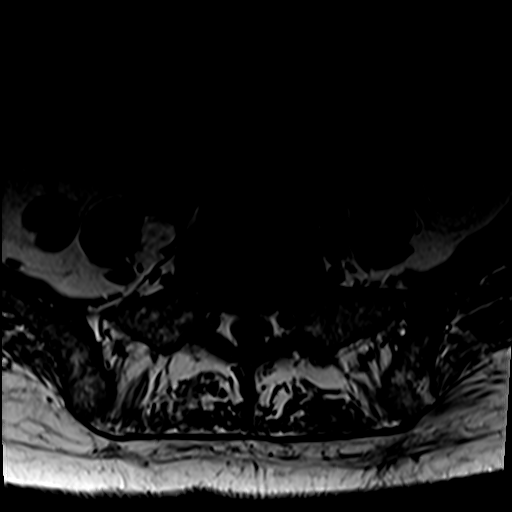
[im 6/34]
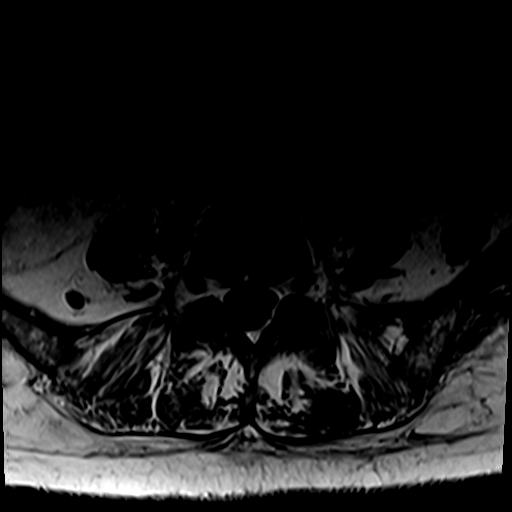
[im 11/34]
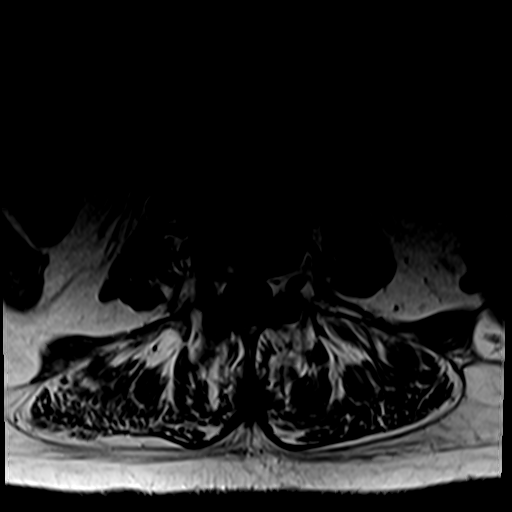
[im 15/34]
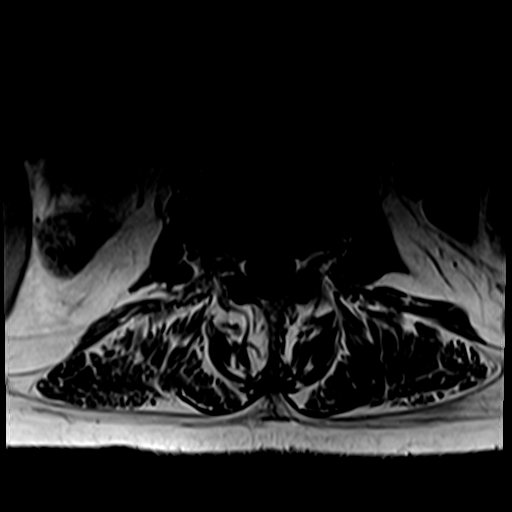
[im 18/34]
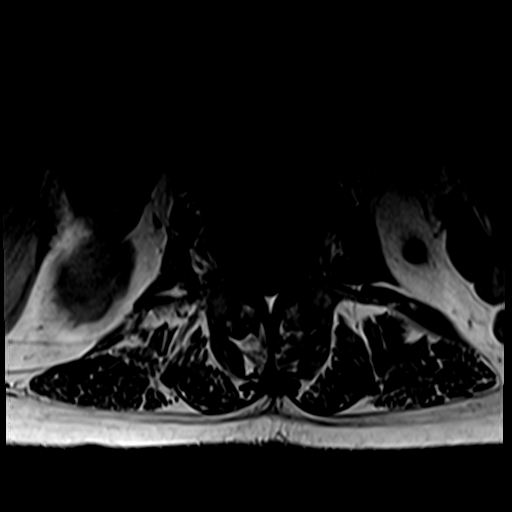
[im 19/34]
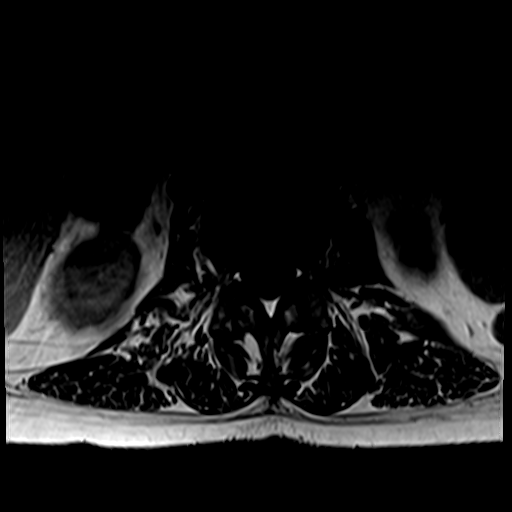
[im 23/34]
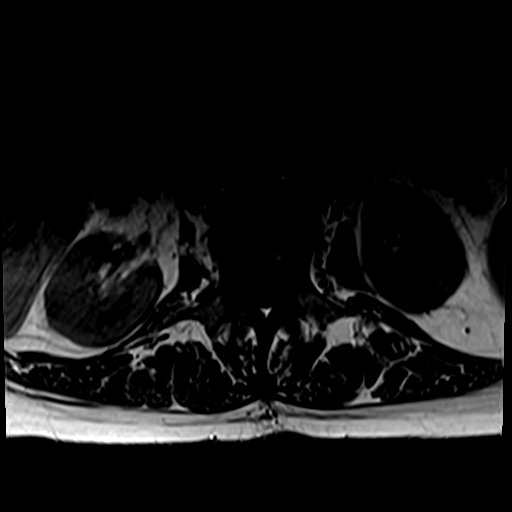
[im 28/34]
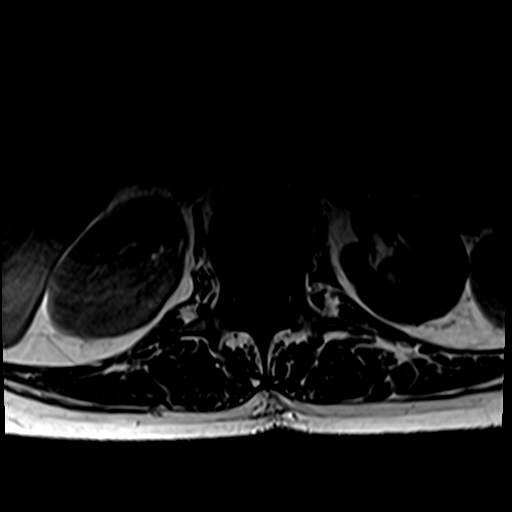
[im 29/34]
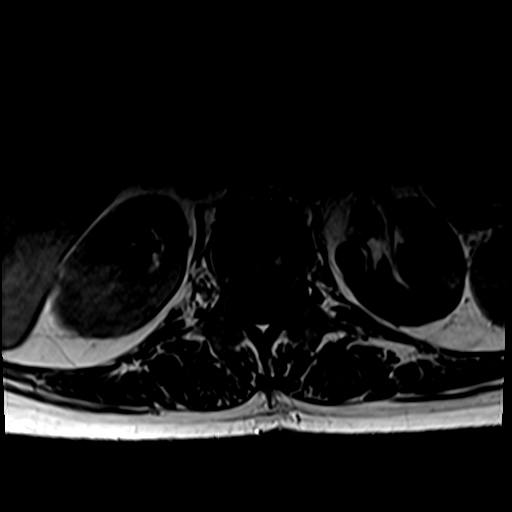
[im 32/34]
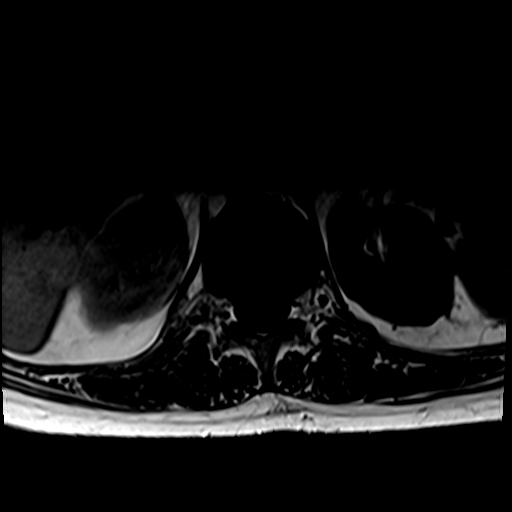

[36 of 48 positions shown; findings below may reference images not displayed]

FINDINGS: Segmentation:  Standard.

Alignment:  Trace retrolisthesis at L2-L3 and L3-L4.

Vertebrae: There is chronic loss of height at the superior and
inferior endplates of L2 and the superior endplate of L5. Loss of
height (less than 50%) at the inferior endplate of L3 is new since
the prior CT. No substantial osseous retropulsion. Mild marrow edema
is present at this level. There is no suspicious osseous lesion.

Conus medullaris and cauda equina: Conus extends to the L1 level.
Conus and cauda equina appear normal.

Paraspinal and other soft tissues: Retroperitoneal adenopathy the
reflecting known lymphoma.

Disc levels:

L1-L2: Minimal disc bulge. Mild facet arthropathy. No canal or
foraminal stenosis.

L2-L3: Minimal disc bulge. Mild facet arthropathy. No canal or
foraminal stenosis.

L3-L4: Minimal disc bulge. Mild facet arthropathy. No canal or
foraminal stenosis.

L4-L5: Mild disc bulge. Moderate facet arthropathy with ligamentum
flavum infolding. No canal or foraminal stenosis.

L5-S1: Small central disc protrusion and annular fissure. Mild facet
arthropathy. No canal or foraminal stenosis.
IMPRESSION: Acute or subacute compression deformity of L3 with less than 50%
loss of height.

Mild degenerative changes without high-grade stenosis.

Retroperitoneal adenopathy.

## 2021-12-02 ENCOUNTER — Ambulatory Visit: Payer: Medicare Other | Admitting: Podiatry

## 2022-04-09 ENCOUNTER — Ambulatory Visit (INDEPENDENT_AMBULATORY_CARE_PROVIDER_SITE_OTHER): Payer: Medicare Other | Admitting: Nurse Practitioner

## 2022-04-09 ENCOUNTER — Encounter: Payer: Self-pay | Admitting: Nurse Practitioner

## 2022-04-09 VITALS — BP 111/73 | HR 87 | Temp 98.6°F | Ht 62.99 in | Wt 161.4 lb

## 2022-04-09 DIAGNOSIS — R0602 Shortness of breath: Secondary | ICD-10-CM | POA: Insufficient documentation

## 2022-04-09 DIAGNOSIS — Z5320 Procedure and treatment not carried out because of patient's decision for unspecified reasons: Secondary | ICD-10-CM

## 2022-04-09 DIAGNOSIS — F17220 Nicotine dependence, chewing tobacco, uncomplicated: Secondary | ICD-10-CM

## 2022-04-09 DIAGNOSIS — R591 Generalized enlarged lymph nodes: Secondary | ICD-10-CM

## 2022-04-09 DIAGNOSIS — D692 Other nonthrombocytopenic purpura: Secondary | ICD-10-CM | POA: Diagnosis not present

## 2022-04-09 DIAGNOSIS — E1129 Type 2 diabetes mellitus with other diabetic kidney complication: Secondary | ICD-10-CM | POA: Diagnosis not present

## 2022-04-09 DIAGNOSIS — R809 Proteinuria, unspecified: Secondary | ICD-10-CM | POA: Diagnosis not present

## 2022-04-09 DIAGNOSIS — R051 Acute cough: Secondary | ICD-10-CM | POA: Diagnosis not present

## 2022-04-09 DIAGNOSIS — C8590 Non-Hodgkin lymphoma, unspecified, unspecified site: Secondary | ICD-10-CM

## 2022-04-09 DIAGNOSIS — E119 Type 2 diabetes mellitus without complications: Secondary | ICD-10-CM | POA: Diagnosis not present

## 2022-04-09 DIAGNOSIS — I700 Atherosclerosis of aorta: Secondary | ICD-10-CM | POA: Insufficient documentation

## 2022-04-09 DIAGNOSIS — J449 Chronic obstructive pulmonary disease, unspecified: Secondary | ICD-10-CM | POA: Insufficient documentation

## 2022-04-09 LAB — MICROALBUMIN, URINE WAIVED
Creatinine, Urine Waived: 100 mg/dL (ref 10–300)
Microalb, Ur Waived: 80 mg/L — ABNORMAL HIGH (ref 0–19)

## 2022-04-09 LAB — BAYER DCA HB A1C WAIVED: HB A1C (BAYER DCA - WAIVED): 5.2 % (ref 4.8–5.6)

## 2022-04-09 MED ORDER — ALBUTEROL SULFATE HFA 108 (90 BASE) MCG/ACT IN AERS: 2.0000 | INHALATION_SPRAY | Freq: Four times a day (QID) | RESPIRATORY_TRACT | 1 refills | Status: DC | PRN

## 2022-04-09 MED ORDER — UMECLIDINIUM-VILANTEROL 62.5-25 MCG/ACT IN AEPB: 1.0000 | INHALATION_SPRAY | Freq: Every day | RESPIRATORY_TRACT | 3 refills | Status: DC

## 2022-04-09 MED ORDER — AMOXICILLIN-POT CLAVULANATE 875-125 MG PO TABS: 1.0000 | ORAL_TABLET | Freq: Two times a day (BID) | ORAL | 0 refills | Status: AC

## 2022-04-09 MED ORDER — FUROSEMIDE 20 MG PO TABS: 10.0000 mg | ORAL_TABLET | Freq: Every day | ORAL | 0 refills | Status: DC

## 2022-04-09 MED ORDER — IPRATROPIUM-ALBUTEROL 0.5-2.5 (3) MG/3ML IN SOLN
3.0000 mL | Freq: Once | RESPIRATORY_TRACT | Status: AC
  Administered 2022-04-09: 3 mL via RESPIRATORY_TRACT

## 2022-04-09 MED ORDER — PREDNISONE 20 MG PO TABS: 40.0000 mg | ORAL_TABLET | Freq: Every day | ORAL | 0 refills | Status: AC

## 2022-04-09 NOTE — Progress Notes (Signed)
BP 111/73   Pulse 87   Temp 98.6 F (37 C) (Oral)   Ht 5' 2.99" (1.6 m)   Wt 161 lb 6.4 oz (73.2 kg)   SpO2 94%   BMI 28.60 kg/m    Subjective:    Patient ID: Natasha [name], female    DOB: 08/08/1946, 76 y.o.   MRN: [id number]  HPI: Natasha [name] is a 76 y.o. female  Chief Complaint  Patient presents with   Cough    Started about a months ago. Patient states that cough is very productive.   Shortness of Breath    Current everyday smoking.    Insomnia    Finding it hard to sleep   Leg Swelling    B/L leg and foot swelling    Wheezing   Facial Swelling    Swelling started about a month ago    DIABETES Last A1c 5.9%.  Currently no medications. Hypoglycemic episodes:no Polydipsia/polyuria: no Visual disturbance: no Chest pain: no Paresthesias: no Glucose Monitoring: no  Accucheck frequency: not checking  Fasting glucose:  Post prandial:  Evening:  Before meals: Taking Insulin?: no  Long acting insulin:  Short acting insulin: Blood Pressure Monitoring: not checking Retinal Examination: Not up to Date Foot Exam: Up to Date Pneumovax:  refuses Influenza: Up to Date Aspirin: no   SHORTNESS OF BREATH/COUGH/WHEEZING Started about one month ago feeling weak and shortness of breath.  Walking from dining room to kitchen will have SOB with wheezing.  Is a smoker, started at age 15.  Currently smokes 1/2 PPD.  Not interested in quitting.  Coughing up dark yellow phlegm.  Has used inhalers in past, but none current.  States along with the cough her face feels swollen and jaw feels locked + legs have been swollen.  Has history of lymphoma, she reports oncology cleared her and she takes no medications.  Last visit 11/02/2018.  Has swelling in legs, presented yesterday morning.  Does have SOB when lying down with coughing.  Reports the face swelling started about one month ago.  Discussed return to oncology with her due to her history, to which she reports "I am not going  back there because God cleared my cancer and they lied to me there". Duration: weeks Circumstances of initial development of cough: unknown Cough severity: moderate Cough description: productive Aggravating factors:   nothing specific -- getting worse Alleviating factors:  Vicks, Nyquil, Dayquil Status:  worse Treatments attempted:  nothing is working Wheezing: yes Shortness of breath: yes Chest pain: no Chest tightness:no Nasal congestion: yes Runny nose: yes Postnasal drip: yes Frequent throat clearing or swallowing: yes Hemoptysis: no Fevers: no Night sweats: no Weight loss: no Heartburn: no Recent foreign travel: no Tuberculosis contacts: no   INSOMNIA Due to cough recently -- can not sleep. Duration: months Satisfied with sleep quality: no Difficulty falling asleep: yes Difficulty staying asleep: yes Waking a few hours after sleep onset: yes Early morning awakenings: no Daytime hypersomnolence: no Wakes feeling refreshed: no Good sleep hygiene: yes Apnea: no Snoring: no Depressed/anxious mood: no Recent stress: no Restless legs/nocturnal leg cramps: no Chronic pain/arthritis: no History of sleep study: no Treatments attempted: none    Relevant past medical, surgical, family and social history reviewed and updated as indicated. Interim medical history since our last visit reviewed. Allergies and medications reviewed and updated.  Review of Systems  Constitutional:  Negative for activity change, appetite change, diaphoresis, fatigue and fever.  HENT:  Positive for congestion,  postnasal drip and rhinorrhea. Negative for ear discharge, ear pain, sinus pressure, sinus pain, sneezing, sore throat and voice change.   Respiratory:  Positive for cough, chest tightness, shortness of breath and wheezing.   Cardiovascular:  Positive for leg swelling. Negative for chest pain and palpitations.  Gastrointestinal: Negative.   Endocrine: Negative for polydipsia, polyphagia  and polyuria.  Neurological: Negative.   Psychiatric/Behavioral:  Positive for sleep disturbance. Negative for decreased concentration, self-injury and suicidal ideas. The patient is not nervous/anxious.     Per HPI unless specifically indicated above     Objective:    BP 111/73   Pulse 87   Temp 98.6 F (37 C) (Oral)   Ht 5' 2.99" (1.6 m)   Wt 161 lb 6.4 oz (73.2 kg)   SpO2 94%   BMI 28.60 kg/m   Wt Readings from Last 3 Encounters:  04/09/22 161 lb 6.4 oz (73.2 kg)  08/25/21 169 lb 9.6 oz (76.9 kg)  05/25/21 168 lb 6 oz (76.4 kg)    Physical Exam Vitals and nursing note reviewed.  Constitutional:      General: She is awake. She is not in acute distress.    Appearance: She is well-developed and well-groomed. She is ill-appearing. She is not toxic-appearing.  HENT:     Head: Normocephalic.     Right Ear: Hearing, tympanic membrane, ear canal and external ear normal.     Left Ear: Hearing, tympanic membrane, ear canal and external ear normal.     Nose: Nose normal.     Right Sinus: No maxillary sinus tenderness or frontal sinus tenderness.     Left Sinus: No maxillary sinus tenderness or frontal sinus tenderness.     Mouth/Throat:     Mouth: Mucous membranes are moist.     Pharynx: Posterior oropharyngeal erythema (mild with cobblestoning) present. No pharyngeal swelling or oropharyngeal exudate.  Eyes:     General: Lids are normal.        Right eye: No discharge.        Left eye: No discharge.     Conjunctiva/sclera: Conjunctivae normal.     Pupils: Pupils are equal, round, and reactive to light.  Neck:     Thyroid: No thyromegaly.     Vascular: No carotid bruit.  Cardiovascular:     Rate and Rhythm: Normal rate and regular rhythm.     Heart sounds: Normal heart sounds. No murmur heard.    No gallop.  Pulmonary:     Effort: Pulmonary effort is normal. No accessory muscle usage or respiratory distress.     Breath sounds: Decreased breath sounds and wheezing  present. No rhonchi.     Comments: Expiratory wheezes noted throughout lung fields. Frequent coughing present. Abdominal:     General: Bowel sounds are normal. There is no distension.     Palpations: Abdomen is soft.     Tenderness: There is no abdominal tenderness.  Musculoskeletal:     Cervical back: Normal range of motion and neck supple.     Right lower leg: 2+ Edema present.     Left lower leg: 2+ Edema present.  Lymphadenopathy:     Head:     Right side of head: Submandibular adenopathy present. No submental, tonsillar, preauricular, posterior auricular or occipital adenopathy.     Left side of head: Submandibular adenopathy present. No submental, tonsillar, preauricular, posterior auricular or occipital adenopathy.     Cervical: Cervical adenopathy present.     Right cervical: Superficial cervical adenopathy  present. No deep or posterior cervical adenopathy.    Left cervical: Superficial cervical adenopathy present. No deep or posterior cervical adenopathy.  Skin:    General: Skin is warm and dry.  Neurological:     Mental Status: She is alert and oriented to person, place, and time.     Cranial Nerves: Cranial nerves 2-12 are intact.     Deep Tendon Reflexes: Reflexes are normal and symmetric.     Reflex Scores:      Brachioradialis reflexes are 2+ on the right side and 2+ on the left side.      Patellar reflexes are 2+ on the right side and 2+ on the left side. Psychiatric:        Attention and Perception: Attention normal.        Mood and Affect: Mood normal.        Behavior: Behavior normal. Behavior is cooperative.        Thought Content: Thought content normal.        Judgment: Judgment normal.    Results for orders placed or performed in visit on 04/09/22  Bayer DCA Hb A1c Waived  Result Value Ref Range   HB A1C (BAYER DCA - WAIVED) 5.2 4.8 - 5.6 %  Microalbumin, Urine Waived  Result Value Ref Range   Microalb, Ur Waived 80 (H) 0 - 19 mg/L   Creatinine, Urine  Waived 100 10 - 300 mg/dL   Microalb/Creat Ratio 30-300 (H) <30 mg/g      Assessment & Plan:   Problem List Items Addressed This Visit       Cardiovascular and Mediastinum   Atherosclerosis of aorta ([place])    Noted on past imaging on review, would benefit from statin but refuses this.  Recommend complete cessation of smoking.      Relevant Medications   furosemide (LASIX) 20 MG tablet   Senile purpura ([place])    Noted on exam.  Recommend gentle skin care and monitor for for skin breakdown and immediately notify provider if present.      Relevant Medications   furosemide (LASIX) 20 MG tablet     Respiratory   COPD (chronic obstructive pulmonary disease) ([place])    Suspect underlying emphysema present, she refuses lung screening.  Has smoked since age 77.  Current acute exacerbation (wheezing, SOB, Cough).  At this time provide Duoneb in office and: - Start Anoro 1 puff into lungs daily and Albuterol as needed for wheezing or SOB. - Script for Prednisone 40 MG daily x 5 days and Augmentin BID for 7 days sent for acute exacerbation. - Obtain CXR, although unsure she will obtain as she reports issues with transportation. - Consider spirometry in office at upcoming visit. Return in one week.       Relevant Medications   predniSONE (DELTASONE) 20 MG tablet   umeclidinium-vilanterol (ANORO ELLIPTA) 62.5-25 MCG/ACT AEPB   albuterol (VENTOLIN HFA) 108 (90 Base) MCG/ACT inhaler     Endocrine   Type 2 diabetes mellitus with proteinuria ([facility])    Chronic, ongoing.  A1c today 5.2% and urine ALB 80 (June 2023).  Can maintain off Metformin at this time, but discussed starting ARB in future for kidney protection.  Avoid ACE due to underlying lung disease.  Could do low dose Olmesartan in future if patient agreeable, at this time she refuses.  Recommend check BS at home at least a few times a month and document for provider.  Restart medication as needed in future. Labs today  CBC, CMP, lipid,  TSH.      Relevant Orders   Bayer DCA Hb A1c Waived (Completed)   Microalbumin, Urine Waived (Completed)   Lipid Panel w/o Chol/HDL Ratio   TSH     Immune and Lymphatic   Generalized lymphadenopathy    Refer to lymphoma plan of care.      Relevant Orders   CBC with Differential/Platelet     Other   Acute cough    Refer to COPD plan of care, suspect exacerbation present.  Will also obtain labs and order echo today + check BNP due to edema and SBE being present.  Return in one week.  Have advised her if worsening over weekend to immediately go to ER.      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  Lymphadenopathy at this time.  Obtain u/s of soft tissue neck to further assess.  Check labs today CBC, CMP.  She refuses return to oncology, highly recommended this and discussed with her findings of exam.  She reports "God cured her in past".      Relevant Medications   amoxicillin-clavulanate (AUGMENTIN) 875-125 MG tablet   predniSONE (DELTASONE) 20 MG tablet   Other Relevant Orders   US Soft Tissue Head/Neck (NON-THYROID)   Lung cancer screening declined by patient    Continues to refuse this at visit today, although is highly recommended and discussed at length.      Nicotine dependence, chewing tobacco, uncomplicated    I have recommended complete cessation of tobacco use. I have discussed various options available for assistance with tobacco cessation including over the counter methods (Nicotine gum, patch and lozenges). We also discussed prescription options (Chantix, Nicotine Inhaler / Nasal Spray). The patient is not interested in pursuing any prescription tobacco cessation options at this time.       SOB (shortness of breath) on exertion     Obtain echo and check BNP today.  Educated patient on this.  If worsening over weekend highly recommend she immediately go to ER.      Relevant Orders   DG Chest 2 View   ECHOCARDIOGRAM COMPLETE     Follow up plan: Return in about 4 days (around 04/13/2022) for Cough with me or Malachy Mood -- whoever has space:).

## 2022-04-09 NOTE — Assessment & Plan Note (Signed)
Previously followed by oncology, suspect lymphoma symptoms worsening at this time based on exam.  Lymphadenopathy at this time.  Obtain u/s of soft tissue neck to further assess.  Check labs today CBC, CMP.  She refuses return to oncology, highly recommended this and discussed with her findings of exam.  She reports "God cured her in past".

## 2022-04-09 NOTE — Assessment & Plan Note (Signed)
Continues to refuse this at visit today, although is highly recommended and discussed at length.

## 2022-04-09 NOTE — Assessment & Plan Note (Signed)
I have recommended complete cessation of tobacco use. I have discussed various options available for assistance with tobacco cessation including over the counter methods (Nicotine gum, patch and lozenges). We also discussed prescription options (Chantix, Nicotine Inhaler / Nasal Spray). The patient is not interested in pursuing any prescription tobacco cessation options at this time.  

## 2022-04-09 NOTE — Assessment & Plan Note (Addendum)
Chronic, ongoing.  A1c today 5.2% and urine ALB 80 (June 2023).  Can maintain off Metformin at this time, but discussed starting ARB in future for kidney protection.  Avoid ACE due to underlying lung disease.  Could do low dose Olmesartan in future if patient agreeable, at this time she refuses.  Recommend check BS at home at least a few times a month and document for provider.  Restart medication as needed in future. Labs today CBC, CMP, lipid, TSH.

## 2022-04-09 NOTE — Patient Instructions (Signed)
COPD and Physical Activity ?Chronic obstructive pulmonary disease (COPD) is a long-term, or chronic, condition that affects the lungs. COPD is a general term that can be used to describe many problems that cause inflammation of the lungs and limit airflow. These conditions include chronic bronchitis and emphysema. ?The main symptom of COPD is shortness of breath, which makes it harder to do even simple tasks. This can also make it harder to exercise and stay active. Talk with your health care provider about treatments to help you breathe better and actions you can take to prevent breathing problems during physical activity. ?What are the benefits of exercising when you have COPD? ?Exercising regularly is an important part of a healthy lifestyle. You can still exercise and do physical activities even though you have COPD. Exercise and physical activity improve your shortness of breath by increasing blood flow (circulation). This causes your heart to pump more oxygen through your body. Moderate exercise can: ?Improve oxygen use. ?Increase your energy level. ?Help with shortness of breath. ?Strengthen your breathing muscles. ?Improve heart health. ?Help with sleep. ?Improve your self-esteem and feelings of self-worth. ?Lower depression, stress, and anxiety. ?Exercise can benefit everyone with COPD. The severity of your disease may affect how hard you can exercise, especially at first, but everyone can benefit. Talk with your health care provider about how much exercise is safe for you, and which activities and exercises are safe for you. ?What actions can I take to prevent breathing problems during physical activity? ?Sign up for a pulmonary rehabilitation program. This type of program may include: ?Education about lung diseases. ?Exercise classes that teach you how to exercise and be more active while improving your breathing. This usually involves: ?Exercise using your lower extremities, such as a stationary  bicycle. ?About 30 minutes of exercise, 2 to 5 times per week, for 6 to 12 weeks. ?Strength training, such as push-ups or leg lifts. ?Nutrition education. ?Group classes in which you can talk with others who also have COPD and learn ways to manage stress. ?If you use an oxygen tank, you should use it while you exercise. Work with your health care provider to adjust your oxygen for your physical activity. Your resting flow rate is different from your flow rate during physical activity. ?How to manage your breathing while exercising ?While you are exercising: ?Take slow breaths. ?Pace yourself, and do nottry to go too fast. ?Purse your lips while breathing out. Pursing your lips is similar to a kissing or whistling position. ?If doing exercise that uses a quick burst of effort, such as weight lifting: ?Breathe in before starting the exercise. ?Breathe out during the hardest part of the exercise, such as raising the weights. ?Where to find support ?You can find support for exercising with COPD from: ?Your health care provider. ?A pulmonary rehabilitation program. ?Your local health department or community health programs. ?Support groups, either online or in-person. Your health care provider may be able to recommend support groups. ?Where to find more information ?You can find more information about exercising with COPD from: ?American Lung Association: lung.org ?COPD Foundation: copdfoundation.org ?Contact a health care provider if: ?Your symptoms get worse. ?You have nausea. ?You have a fever. ?You want to start a new exercise program or a new activity. ?Get help right away if: ?You have chest pain. ?You cannot breathe. ?These symptoms may represent a serious problem that is an emergency. Do not wait to see if the symptoms will go away. Get medical help right away. Call   your local emergency services (911 in the [place] Do not drive yourself to the hospital. ?Summary ?COPD is a general term that can be used to describe  many different lung problems that cause lung inflammation and limit airflow. This includes chronic bronchitis and emphysema. ?Exercise and physical activity improve your shortness of breath by increasing blood flow (circulation). This causes your heart to provide more oxygen to your body. ?Contact your health care provider before starting any exercise program or new activity. Ask your health care provider what exercises and activities are safe for you. ?This information is not intended to replace advice given to you by your health care provider. Make sure you discuss any questions you have with your health care provider. ?Document Revised: 08/19/2020 Document Reviewed: 08/19/2020 ?Elsevier Patient Education ? 2023 Elsevier Inc. ? ?

## 2022-04-09 NOTE — Assessment & Plan Note (Signed)
Noted on exam.  Recommend gentle skin care and monitor for for skin breakdown and immediately notify provider if present.

## 2022-04-09 NOTE — Assessment & Plan Note (Signed)
Noted on past imaging on review, would benefit from statin but refuses this.  Recommend complete cessation of smoking.

## 2022-04-09 NOTE — Assessment & Plan Note (Signed)
Refer to COPD plan of care, suspect exacerbation present.  Will also obtain labs and order echo today + check BNP due to edema and SBE being present.  Return in one week.  Have advised her if worsening over weekend to immediately go to ER.

## 2022-04-09 NOTE — Assessment & Plan Note (Signed)
Refer to lymphoma plan of care.

## 2022-04-09 NOTE — Assessment & Plan Note (Signed)
Obtain echo and check BNP today.  Educated patient on this.  If worsening over weekend highly recommend she immediately go to ER.

## 2022-04-09 NOTE — Assessment & Plan Note (Signed)
Suspect underlying emphysema present, she refuses lung screening.  Has smoked since age 76.  Current acute exacerbation (wheezing, SOB, Cough).  At this time provide Duoneb in office and: - Start Anoro 1 puff into lungs daily and Albuterol as needed for wheezing or SOB. - Script for Prednisone 40 MG daily x 5 days and Augmentin BID for 7 days sent for acute exacerbation. - Obtain CXR, although unsure she will obtain as she reports issues with transportation. - Consider spirometry in office at upcoming visit. Return in one week.

## 2022-04-10 LAB — CBC WITH DIFFERENTIAL/PLATELET
Basophils Absolute: 0.1 10*3/uL (ref 0.0–0.2)
Basos: 1 %
EOS (ABSOLUTE): 0.1 10*3/uL (ref 0.0–0.4)
Eos: 1 %
Hematocrit: 30.9 % — ABNORMAL LOW (ref 34.0–46.6)
Hemoglobin: 9.7 g/dL — ABNORMAL LOW (ref 11.1–15.9)
Immature Grans (Abs): 0.1 10*3/uL (ref 0.0–0.1)
Immature Granulocytes: 2 %
Lymphocytes Absolute: 3.9 10*3/uL — ABNORMAL HIGH (ref 0.7–3.1)
Lymphs: 59 %
MCH: 28.6 pg (ref 26.6–33.0)
MCHC: 31.4 g/dL — ABNORMAL LOW (ref 31.5–35.7)
MCV: 91 fL (ref 79–97)
Monocytes Absolute: 0.8 10*3/uL (ref 0.1–0.9)
Monocytes: 13 %
NRBC: 1 % — ABNORMAL HIGH (ref 0–0)
Neutrophils Absolute: 1.5 10*3/uL (ref 1.4–7.0)
Neutrophils: 24 %
Platelets: 82 10*3/uL — CL (ref 150–450)
RBC: 3.39 x10E6/uL — ABNORMAL LOW (ref 3.77–5.28)
RDW: 15.7 % — ABNORMAL HIGH (ref 11.7–15.4)
WBC: 6.5 10*3/uL (ref 3.4–10.8)

## 2022-04-10 LAB — LIPID PANEL W/O CHOL/HDL RATIO
Cholesterol, Total: 97 mg/dL — ABNORMAL LOW (ref 100–199)
HDL: 14 mg/dL — ABNORMAL LOW (ref >39)
LDL Chol Calc (NIH): 61 mg/dL (ref 0–99)
Triglycerides: 119 mg/dL (ref 0–149)
VLDL Cholesterol Cal: 22 mg/dL (ref 5–40)

## 2022-04-10 LAB — COMPREHENSIVE METABOLIC PANEL
ALT: 6 IU/L (ref 0–32)
AST: 19 IU/L (ref 0–40)
Albumin/Globulin Ratio: 2.4 — ABNORMAL HIGH (ref 1.2–2.2)
Albumin: 3.8 g/dL (ref 3.7–4.7)
Alkaline Phosphatase: 144 IU/L — ABNORMAL HIGH (ref 44–121)
BUN/Creatinine Ratio: 24 (ref 12–28)
BUN: 24 mg/dL (ref 8–27)
Bilirubin Total: 1.4 mg/dL — ABNORMAL HIGH (ref 0.0–1.2)
CO2: 26 mmol/L (ref 20–29)
Calcium: 9.3 mg/dL (ref 8.7–10.3)
Chloride: 104 mmol/L (ref 96–106)
Creatinine, Ser: 1.01 mg/dL — ABNORMAL HIGH (ref 0.57–1.00)
Globulin, Total: 1.6 g/dL (ref 1.5–4.5)
Glucose: 134 mg/dL — ABNORMAL HIGH (ref 70–99)
Potassium: 4.3 mmol/L (ref 3.5–5.2)
Sodium: 144 mmol/L (ref 134–144)
Total Protein: 5.4 g/dL — ABNORMAL LOW (ref 6.0–8.5)
eGFR: 58 mL/min/{1.73_m2} — ABNORMAL LOW (ref >59)

## 2022-04-10 LAB — BRAIN NATRIURETIC PEPTIDE: BNP: 51.8 pg/mL (ref 0.0–100.0)

## 2022-04-10 LAB — TSH: TSH: 5.3 u[IU]/mL — ABNORMAL HIGH (ref 0.450–4.500)

## 2022-04-11 ENCOUNTER — Other Ambulatory Visit: Payer: Self-pay | Admitting: Nurse Practitioner

## 2022-04-11 DIAGNOSIS — D649 Anemia, unspecified: Secondary | ICD-10-CM

## 2022-04-11 DIAGNOSIS — R7989 Other specified abnormal findings of blood chemistry: Secondary | ICD-10-CM

## 2022-04-11 DIAGNOSIS — C8590 Non-Hodgkin lymphoma, unspecified, unspecified site: Secondary | ICD-10-CM

## 2022-04-11 DIAGNOSIS — D696 Thrombocytopenia, unspecified: Secondary | ICD-10-CM

## 2022-04-11 NOTE — Progress Notes (Signed)
Good morning crew, please call patient and alert her labs have returned: - CBC is showing new onset of anemia -- low hemoglobin and hematocrit.  Plus platelets that help the blood clot are very low at 82, previous was 157 for her.  I am going to put a referral in to get her into the blood doctors I am very concerned with these changes and feel they are leading to some of the symptoms we are currently seeing.  I need her to schedule with them ASAP. 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  This is new for her. - Cholesterol levels are stable. - Thyroid level is mildly elevated, we will recheck this at next visit to see if we need to start medication for thyroid health. - Heart lab, BNP, which helps look for heart failure is in normal range.  Any questions?  Please ensure to maintain follow-up this week. Keep being amazing!!  Thank you for allowing me to participate in your care.  I appreciate you. Kindest regards, Annibelle Brazie

## 2022-04-13 ENCOUNTER — Ambulatory Visit: Payer: Medicare Other | Admitting: Family Medicine

## 2022-04-16 ENCOUNTER — Ambulatory Visit
Admission: RE | Admit: 2022-04-16 | Discharge: 2022-04-16 | Disposition: A | Discharge status: Home / Self Care | Payer: Medicare Other | Source: Ambulatory Visit | Attending: Family Medicine | Admitting: Family Medicine

## 2022-04-16 ENCOUNTER — Ambulatory Visit (INDEPENDENT_AMBULATORY_CARE_PROVIDER_SITE_OTHER): Payer: Medicare Other | Admitting: Family Medicine

## 2022-04-16 ENCOUNTER — Encounter: Payer: Self-pay | Admitting: Family Medicine

## 2022-04-16 ENCOUNTER — Ambulatory Visit: Payer: Self-pay | Admitting: *Deleted

## 2022-04-16 ENCOUNTER — Other Ambulatory Visit: Payer: Self-pay | Admitting: Family Medicine

## 2022-04-16 VITALS — BP 88/50 | HR 73 | Temp 97.7°F | Wt 163.4 lb

## 2022-04-16 DIAGNOSIS — F17220 Nicotine dependence, chewing tobacco, uncomplicated: Secondary | ICD-10-CM | POA: Diagnosis not present

## 2022-04-16 DIAGNOSIS — D61818 Other pancytopenia: Secondary | ICD-10-CM

## 2022-04-16 DIAGNOSIS — D649 Anemia, unspecified: Secondary | ICD-10-CM

## 2022-04-16 DIAGNOSIS — R601 Generalized edema: Secondary | ICD-10-CM | POA: Diagnosis not present

## 2022-04-16 DIAGNOSIS — I700 Atherosclerosis of aorta: Secondary | ICD-10-CM | POA: Diagnosis not present

## 2022-04-16 DIAGNOSIS — D696 Thrombocytopenia, unspecified: Secondary | ICD-10-CM

## 2022-04-16 DIAGNOSIS — R7989 Other specified abnormal findings of blood chemistry: Secondary | ICD-10-CM | POA: Diagnosis not present

## 2022-04-16 DIAGNOSIS — C8590 Non-Hodgkin lymphoma, unspecified, unspecified site: Secondary | ICD-10-CM

## 2022-04-16 DIAGNOSIS — R161 Splenomegaly, not elsewhere classified: Secondary | ICD-10-CM | POA: Diagnosis not present

## 2022-04-16 DIAGNOSIS — R0602 Shortness of breath: Secondary | ICD-10-CM

## 2022-04-16 DIAGNOSIS — R911 Solitary pulmonary nodule: Secondary | ICD-10-CM | POA: Diagnosis not present

## 2022-04-16 DIAGNOSIS — R590 Localized enlarged lymph nodes: Secondary | ICD-10-CM | POA: Diagnosis not present

## 2022-04-16 DIAGNOSIS — F17210 Nicotine dependence, cigarettes, uncomplicated: Secondary | ICD-10-CM | POA: Diagnosis not present

## 2022-04-16 DIAGNOSIS — Z8572 Personal history of non-Hodgkin lymphomas: Secondary | ICD-10-CM | POA: Diagnosis not present

## 2022-04-16 DIAGNOSIS — K8020 Calculus of gallbladder without cholecystitis without obstruction: Secondary | ICD-10-CM | POA: Diagnosis not present

## 2022-04-16 DIAGNOSIS — R9389 Abnormal findings on diagnostic imaging of other specified body structures: Secondary | ICD-10-CM | POA: Diagnosis not present

## 2022-04-16 DIAGNOSIS — R935 Abnormal findings on diagnostic imaging of other abdominal regions, including retroperitoneum: Secondary | ICD-10-CM | POA: Diagnosis not present

## 2022-04-16 MED ORDER — IOHEXOL 300 MG/ML  SOLN
100.0000 mL | Freq: Once | INTRAMUSCULAR | Status: AC | PRN
  Administered 2022-04-16: 100 mL via INTRAVENOUS

## 2022-04-16 NOTE — Telephone Encounter (Signed)
CT result from [facility] from [facility].   Reason for Disposition  [1] Follow-up call to recent contact AND [2] information only call, no triage required    CT Scan results  Answer Assessment - Initial Assessment Questions 1. REASON FOR CALL or QUESTION: "What is your reason for calling today?" or "How can I best help you?" or "What question do you have that I can help answer?"     [place] with [facility] calling in regarding stat results of a CT scan on this pt. Results in Epic  Protocols used: Information Only Call - No Triage-A-AH

## 2022-04-16 NOTE — Telephone Encounter (Addendum)
  Chief Complaint: CT of chest, abd and pelvis stat result called in from [facility] Symptoms: N/A Frequency: N/A Pertinent Negatives: Patient denies N/A Disposition: [] ED /[] Urgent Care (no appt availability in office) / [] Appointment(In office/virtual)/ []  [place]/ [] Home Care/ [] Refused Recommended Disposition /[] [facility]/ [x]  Follow-up with PCP Additional Notes: Message sent to [facility] high priority for Dr. Olevia Perches and I called into practice and spoke with Jon Gills making her aware of stat CT result for Dr. Laural Benes. She will get it to her.

## 2022-04-17 LAB — CBC WITH DIFFERENTIAL/PLATELET
Basophils Absolute: 0.1 10*3/uL (ref 0.0–0.2)
Basos: 1 %
EOS (ABSOLUTE): 0 10*3/uL (ref 0.0–0.4)
Eos: 0 %
Hematocrit: 26.6 % — ABNORMAL LOW (ref 34.0–46.6)
Hemoglobin: 8.2 g/dL — ABNORMAL LOW (ref 11.1–15.9)
Immature Grans (Abs): 0.3 10*3/uL — ABNORMAL HIGH (ref 0.0–0.1)
Immature Granulocytes: 2 %
Lymphocytes Absolute: 8.2 10*3/uL — ABNORMAL HIGH (ref 0.7–3.1)
Lymphs: 71 %
MCH: 28.5 pg (ref 26.6–33.0)
MCHC: 30.8 g/dL — ABNORMAL LOW (ref 31.5–35.7)
MCV: 92 fL (ref 79–97)
Monocytes Absolute: 0.7 10*3/uL (ref 0.1–0.9)
Monocytes: 6 %
NRBC: 1 % — ABNORMAL HIGH (ref 0–0)
Neutrophils Absolute: 2.3 10*3/uL (ref 1.4–7.0)
Neutrophils: 20 %
Platelets: 89 10*3/uL — CL (ref 150–450)
RBC: 2.88 x10E6/uL — ABNORMAL LOW (ref 3.77–5.28)
RDW: 16 % — ABNORMAL HIGH (ref 11.7–15.4)
WBC: 11.6 10*3/uL — ABNORMAL HIGH (ref 3.4–10.8)

## 2022-04-17 LAB — BASIC METABOLIC PANEL
BUN/Creatinine Ratio: 35 — ABNORMAL HIGH (ref 12–28)
BUN: 39 mg/dL — ABNORMAL HIGH (ref 8–27)
CO2: 22 mmol/L (ref 20–29)
Calcium: 9.2 mg/dL (ref 8.7–10.3)
Chloride: 103 mmol/L (ref 96–106)
Creatinine, Ser: 1.12 mg/dL — ABNORMAL HIGH (ref 0.57–1.00)
Glucose: 219 mg/dL — ABNORMAL HIGH (ref 70–99)
Potassium: 3.7 mmol/L (ref 3.5–5.2)
Sodium: 141 mmol/L (ref 134–144)
eGFR: 52 mL/min/{1.73_m2} — ABNORMAL LOW (ref >59)

## 2022-04-17 LAB — IRON AND TIBC
Iron Saturation: 32 % (ref 15–55)
Iron: 91 ug/dL (ref 27–139)
Total Iron Binding Capacity: 288 ug/dL (ref 250–450)
UIBC: 197 ug/dL (ref 118–369)

## 2022-04-17 LAB — URIC ACID: Uric Acid: 10 mg/dL — ABNORMAL HIGH (ref 3.1–7.9)

## 2022-04-17 LAB — TSH: TSH: 3.05 u[IU]/mL (ref 0.450–4.500)

## 2022-04-17 LAB — LACTATE DEHYDROGENASE: LDH: 289 IU/L — ABNORMAL HIGH (ref 119–226)

## 2022-04-17 LAB — FERRITIN: Ferritin: 143 ng/mL (ref 15–150)

## 2022-04-17 LAB — THYROID PEROXIDASE ANTIBODY: Thyroperoxidase Ab SerPl-aCnc: <9 IU/mL (ref 0–34)

## 2022-04-17 LAB — T4, FREE: Free T4: 1.27 ng/dL (ref 0.82–1.77)

## 2022-04-19 ENCOUNTER — Ambulatory Visit: Payer: Medicare Other

## 2022-04-22 ENCOUNTER — Ambulatory Visit: Payer: Self-pay | Admitting: *Deleted

## 2022-04-22 NOTE — Telephone Encounter (Signed)
Routing to provider to advise.  

## 2022-04-22 NOTE — Telephone Encounter (Signed)
Spoke with patient regarding ED advise from [place]. Pt states "Oh so you all lied, okay have a good day" and proceeded to end the call. Notified PCP.

## 2022-04-22 NOTE — Telephone Encounter (Signed)
Patient needs to be seen in the ER.  A virtual visit is not appropriate.  She needs further evaluation and treatment.

## 2022-04-22 NOTE — Telephone Encounter (Signed)
  Chief Complaint: jaw pain requesting medication Symptoms: left side of jaw pain. "Locked jaw" unable to open mouth to brush teeth or eat very well. Can drink.  Frequency: 1 month Pertinent Negatives: Patient denies difficulty breathing, dehydration sx  Disposition: '[x]'$ ED /'[]'$ Urgent Care (no appt availability in office) / '[]'$ Appointment(In office/virtual)/ '[]'$  [facility]/ '[]'$ Home Care/ '[x]'$ Refused Recommended Disposition /'[]'$ [facility]/ '[]'$  Follow-up with PCP Additional Notes:    Recommended ED patient refused due to transportation needs 3 days to arrange and does not want to go to ED. Reports she called office yesterday and was told by "someone" the Dr would be notified and medication would be sent to pharmacy. No medication has been sent. No documentation found of encounter from yesterday .please advise if VV appt can be scheduled.  Please advise .  FC Alexis notified regarding issue and NT not able to see any documentation from yesterday per patient.   Reason for Disposition  Patient sounds very sick or weak to the triager  Answer Assessment - Initial Assessment Questions 1. SYMPTOM: "What's the main symptom you're concerned about?" (e.g., chapped lips, dry mouth, lump, sores)     Jaw pain and unable to open mouth  2. ONSET: "When did the  jaw pain   start?"     1 month ago  3. PAIN: "Is there any pain?" If Yes, ask: "How bad is it?" (Scale: 1-10; mild, moderate, severe)   - MILD (1-3):  doesn't interfere with eating or normal activities   - MODERATE (4-7): interferes with eating some solids and normal activities   - SEVERE (8-10):  excruciating pain, interferes with most normal activities   - SEVERE DYSPHAGIA: can't swallow liquids, drooling     Yes  4. CAUSE: "What do you think is causing the symptoms?"     unknown 5. OTHER SYMPTOMS: "Do you have any other symptoms?" (e.g., fever, sore throat, toothache, swelling)     Left side pain and unable to open mouth to eat  can drink can not brush teeth 6. PREGNANCY: "Is there any chance you are pregnant?" "When was your last menstrual period?"     na  Protocols used: Mouth Symptoms-A-AH

## 2022-04-22 NOTE — Progress Notes (Signed)
Prednisone not appropriate. Patient needs to see oncology.

## 2022-04-23 DIAGNOSIS — I700 Atherosclerosis of aorta: Secondary | ICD-10-CM | POA: Diagnosis not present

## 2022-04-23 DIAGNOSIS — D649 Anemia, unspecified: Secondary | ICD-10-CM | POA: Insufficient documentation

## 2022-04-23 DIAGNOSIS — J449 Chronic obstructive pulmonary disease, unspecified: Secondary | ICD-10-CM | POA: Diagnosis not present

## 2022-04-23 DIAGNOSIS — D696 Thrombocytopenia, unspecified: Secondary | ICD-10-CM | POA: Diagnosis not present

## 2022-04-23 DIAGNOSIS — R059 Cough, unspecified: Secondary | ICD-10-CM | POA: Diagnosis not present

## 2022-04-23 DIAGNOSIS — J9811 Atelectasis: Secondary | ICD-10-CM | POA: Diagnosis not present

## 2022-04-23 DIAGNOSIS — R0602 Shortness of breath: Secondary | ICD-10-CM | POA: Diagnosis not present

## 2022-04-23 DIAGNOSIS — J90 Pleural effusion, not elsewhere classified: Secondary | ICD-10-CM | POA: Insufficient documentation

## 2022-04-23 DIAGNOSIS — R600 Localized edema: Secondary | ICD-10-CM | POA: Diagnosis not present

## 2022-04-23 DIAGNOSIS — R609 Edema, unspecified: Secondary | ICD-10-CM | POA: Diagnosis not present

## 2022-04-23 DIAGNOSIS — R053 Chronic cough: Secondary | ICD-10-CM | POA: Diagnosis present

## 2022-04-23 DIAGNOSIS — R079 Chest pain, unspecified: Secondary | ICD-10-CM | POA: Diagnosis not present

## 2022-04-24 ENCOUNTER — Emergency Department: Payer: Medicare Other

## 2022-04-24 ENCOUNTER — Emergency Department
Admission: EM | Admit: 2022-04-24 | Discharge: 2022-04-25 | Disposition: A | Discharge status: Other Acute Inpatient Hospital | Payer: Medicare Other | Attending: Emergency Medicine | Admitting: Emergency Medicine

## 2022-04-24 DIAGNOSIS — R609 Edema, unspecified: Secondary | ICD-10-CM

## 2022-04-24 DIAGNOSIS — J9811 Atelectasis: Secondary | ICD-10-CM | POA: Diagnosis not present

## 2022-04-24 DIAGNOSIS — J90 Pleural effusion, not elsewhere classified: Secondary | ICD-10-CM | POA: Diagnosis not present

## 2022-04-24 DIAGNOSIS — R0609 Other forms of dyspnea: Secondary | ICD-10-CM

## 2022-04-24 DIAGNOSIS — R079 Chest pain, unspecified: Secondary | ICD-10-CM | POA: Diagnosis not present

## 2022-04-24 DIAGNOSIS — I700 Atherosclerosis of aorta: Secondary | ICD-10-CM | POA: Diagnosis not present

## 2022-04-24 DIAGNOSIS — D649 Anemia, unspecified: Secondary | ICD-10-CM

## 2022-04-24 DIAGNOSIS — R059 Cough, unspecified: Secondary | ICD-10-CM | POA: Diagnosis not present

## 2022-04-24 DIAGNOSIS — D696 Thrombocytopenia, unspecified: Secondary | ICD-10-CM

## 2022-04-24 DIAGNOSIS — R053 Chronic cough: Secondary | ICD-10-CM

## 2022-04-24 LAB — CBC
HCT: 28.1 % — ABNORMAL LOW (ref 36.0–46.0)
Hemoglobin: 8.5 g/dL — ABNORMAL LOW (ref 12.0–15.0)
MCH: 28.6 pg (ref 26.0–34.0)
MCHC: 30.2 g/dL (ref 30.0–36.0)
MCV: 94.6 fL (ref 80.0–100.0)
Platelets: 85 10*3/uL — ABNORMAL LOW (ref 150–400)
RBC: 2.97 MIL/uL — ABNORMAL LOW (ref 3.87–5.11)
RDW: 16.6 % — ABNORMAL HIGH (ref 11.5–15.5)
WBC: 9.6 10*3/uL (ref 4.0–10.5)
nRBC: 0.8 % — ABNORMAL HIGH (ref 0.0–0.2)

## 2022-04-24 LAB — BASIC METABOLIC PANEL
Anion gap: 6 (ref 5–15)
BUN: 31 mg/dL — ABNORMAL HIGH (ref 8–23)
CO2: 26 mmol/L (ref 22–32)
Calcium: 8.7 mg/dL — ABNORMAL LOW (ref 8.9–10.3)
Chloride: 111 mmol/L (ref 98–111)
Creatinine, Ser: 1 mg/dL (ref 0.44–1.00)
GFR, Estimated: 59 mL/min — ABNORMAL LOW (ref >60)
Glucose, Bld: 263 mg/dL — ABNORMAL HIGH (ref 70–99)
Potassium: 3.8 mmol/L (ref 3.5–5.1)
Sodium: 143 mmol/L (ref 135–145)

## 2022-04-24 LAB — TROPONIN I (HIGH SENSITIVITY)
Troponin I (High Sensitivity): 5 ng/L (ref <18)
Troponin I (High Sensitivity): 6 ng/L (ref <18)

## 2022-04-24 LAB — BRAIN NATRIURETIC PEPTIDE: B Natriuretic Peptide: 332.8 pg/mL — ABNORMAL HIGH (ref 0.0–100.0)

## 2022-04-24 NOTE — ED Provider Notes (Signed)
Emergency department handoff note  Care of this patient was signed out to me at the end of the previous provider shift.  All pertinent patient information was conveyed and all questions were answered.  Patient pending transfer to facility with access to cardiothoracic surgery.  I spoke to Dr. Eliseo Squires at [facility] who agrees to except this patient in transfer.  Patient and family updated and agrees with plan for transfer to [facility] however it may be sometime as the transfer center stated that they have multiple admitted patients in the emergency department that will take priority and therefore transfer will likely happen later tonight or overnight.   Naaman Plummer, MD 04/24/22 [phone]

## 2022-04-24 NOTE — ED Notes (Signed)
Pt taken to CT.

## 2022-04-24 NOTE — ED Notes (Signed)
Called [facility] for update on transport, will be several more hours per CJ.

## 2022-04-24 NOTE — ED Provider Notes (Signed)
[facility] Provider Note    Event Date/Time   First MD Initiated Contact with Patient 04/24/22 [phone]     (approximate)   History   Cough, Chest Pain, and Shortness of Breath   HPI Level 5 caveat: History is limited by the patient being a very vague historian.  Natasha [name] is a 76 y.o. 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  She presents by private vehicle for evaluation of months of cough that has gradually been getting worse over time.  Over the last month it is gotten significantly worse and then got worse again over the last week.  She saw her primary care provider a week ago who did some labs and told her that some of her blood counts are low (specifically her hemoglobin and her platelets) but then her BMP looked good.  They were planning to do some additional outpatient evaluation, though it is unclear what was the plan.  The patient states that she was told to come to the emergency department and we would give her some pills that would make her feel better.  She also reports that she has had swelling in her legs that has been going on for perhaps a few weeks.  She does not feel short of breath except for when she coughs.  The episodes of coughing have started lasting longer and she feels short of breath when she is coughing, but otherwise she is fine.  She only has chest pain when she coughs and that it is an aching discomfort.  She has not had any trauma.  She denies fever, nausea, vomiting, and abdominal pain.     Physical Exam   Triage Vital Signs: ED Triage Vitals  Enc Vitals Group     BP 04/24/22 0020 117/63     Pulse Rate 04/24/22 0020 75     Resp 04/24/22 0020 (!) 22     Temp 04/24/22 0022 98.4 F (36.9 C)     Temp Source 04/24/22 0022 Oral     SpO2 04/24/22 0020 100 %     Weight --      Height --       Head Circumference --      Peak Flow --      Pain Score 04/24/22 0020 5     Pain Loc --      Pain Edu? --      Excl. in [place]? --     Most recent vital signs: Vitals:   04/24/22 0700 04/24/22 0800  BP: (!) 101/59 97/75  Pulse: 65 77  Resp: 18 18  Temp:    SpO2: 95% 98%     General: Awake, no distress.  Awake and alert, smiling and interactive with me, appears elderly and nontoxic. CV:  Good peripheral perfusion.  Normal heart sounds. Resp:  Normal effort.  No accessory muscle usage.  Upon attempts at inspiration for auscultation, the patient starts to cough, so auscultation is difficult.  However there seems to be decreased breath sounds on the right but still appropriate air movement. Abd:  No distention.  No tenderness to palpation. Other:  No focal neurological deficits.  Patient has 1+ pitting edema in bilateral lower extremities, no evidence of cellulitis.   ED Results / Procedures / Treatments   Labs (all labs ordered are listed, but only abnormal results are  displayed) Labs Reviewed  BASIC METABOLIC PANEL - Abnormal; Notable for the following components:      Result Value   Glucose, Bld 263 (*)    BUN 31 (*)    Calcium 8.7 (*)    GFR, Estimated 59 (*)    All other components within normal limits  CBC - Abnormal; Notable for the following components:   RBC 2.97 (*)    Hemoglobin 8.5 (*)    HCT 28.1 (*)    RDW 16.6 (*)    Platelets 85 (*)    nRBC 0.8 (*)    All other components within normal limits  BRAIN NATRIURETIC PEPTIDE - Abnormal; Notable for the following components:   B Natriuretic Peptide 332.8 (*)    All other components within normal limits  TROPONIN I (HIGH SENSITIVITY)  TROPONIN I (HIGH SENSITIVITY)     EKG  ED ECG REPORT I, Hinda Kehr, the attending physician, personally viewed and interpreted this ECG.  Date: 04/24/2022 EKG Time: 00: 21 Rate: 71 Rhythm: normal sinus rhythm QRS Axis: normal Intervals: normal ST/T Wave  abnormalities: Non-specific ST segment / T-wave changes, but no clear evidence of acute ischemia. Narrative Interpretation: no definitive evidence of acute ischemia; does not meet STEMI criteria.    RADIOLOGY I viewed and interpreted the patient's chest x-ray.  It is concerning for a pleural effusion on the right as well as some interstitial edema.  I viewed and interpreted the CT chest without contrast as well.  It is obvious that there is a large pleural effusion on the right side and there is a small area of pneumothorax.  The radiologist interpreted this is a hemopneumothorax although the report reports that there is a very small amount of air.  Infectious process less likely.    PROCEDURES:  Critical Care performed: No  Procedures   MEDICATIONS ORDERED IN ED: Medications - No data to display   IMPRESSION / MDM / [place] / ED COURSE  I reviewed the triage vital signs and the nursing notes.                              Differential diagnosis includes, but is not limited to, new onset CHF, COPD exacerbation, pneumonia, malignancy, interstitial edema, pneumothorax.  Patient's presentation is most consistent with acute presentation with potential threat to life or bodily function.  The patient is on the cardiac monitor to evaluate for evidence of arrhythmia and/or significant heart rate changes.  The patient's vital signs are stable and within normal limits.  She has no hypoxemia.  She is a lifelong smoker with a COPD diagnosis and her oxygen saturation of about 85% is to be expected.  She is in no respiratory distress and only becomes distressed when she starts coughing.  The only time during my evaluation with her that she started to cough was when I asked her to breathe deeply so that I could auscultate her lungs.  The coughing resolved very quickly.  This does not sound like an infectious process, but rather something that has been gradually worsening over an  extended period of time.  She showed me the paperwork from her outpatient clinic visit that stated that she had thrombocytopenia and anemia.  It also mentioned that her BNP is normal.  Labs ordered tonight in the emergency department include BMP, BNP, CBC, and high-sensitivity troponin.  Studies ordered include two-view chest x-ray and EKG.  EKG shows no  sign of ischemia.  Two-view chest x-ray is concerning for effusion and/or interstitial edema, as I documented above.  High-sensitivity troponin is normal but BNP is slightly elevated at 332 which could be suggestive of new onset CHF.  CBC is consistent with the outpatient labs with platelets of 85 and a slight drop in hemoglobin down to 8.5, but she has no symptomatic anemia.  Her BMP is essentially normal other than hyperglycemia.  Given the nonspecific or nondiagnostic findings on the chest x-ray, I ordered a CT chest without contrast.  As documented above, this is notable for a large pleural effusion and a small amount of pneumothorax, although I do not think that is the primary issue given that she is extremely clinically stable and this is a more insidious process than a spontaneous hemopneumothorax.  I am suspicious for a malignant effusion that has been developing over time, as well as perhaps some underlying new onset CHF.  The patient does not require an emergent thoracentesis nor an emergent thoracostomy.  I put her on 2 L of oxygen for comfort and for the presence of the small pneumothorax, but she does not need supplemental oxygen at this time.  I believe that she would benefit from hospitalization, pulmonary consult, and IR guided thoracentesis to further analyze the effusion.  She also will need a cardiology consultation to evaluate for the possibility of new onset CHF.  I discussed this with her and her friend who is at bedside.  They understand and agree with the plan.  I am consulting the hospitalist service for admission.   Clinical  Course as of 04/24/22 0818  Sat Apr 24, 2022  0753 Discussed case by phone with Dr. Blaine Hamper with the hospitalist service.  He pointed out the concern of the hemopneumothorax listed on the radiology report and feels that the patient should be transferred to a facility with cardiothoracic coverage.  I explained that this is more of a long-term process and that I believe it may be a malignant effusion that could be treated with IR guided thoracentesis, but I understand his point that she may require higher level of care than we can provide.  I discussed the case in person with the patient, her friend, and now her adult daughter who is at bedside.  They are all in agreement with transfer.  I put her on 2 L of oxygen simply because of the report stating that there was a very small pneumothorax, but she is not requiring supplemental oxygen and she is in no distress when she is not coughing, which only occurred when I was in the room when I asked her to take deep breaths when I was auscultating her lungs.  She is hemodynamically stable and in no distress at this time.  The patient and family requested that we transfer her to [facility].  I explained that we will try this first, but given hospital volumes, it may require multiple calls to find a facility with space for her.  I discussed the case in person with Dr. Cheri Fowler, the ED physician who is assuming care for her.  He will touch base with [facility] and try to initiate a transfer. [CF]    Clinical Course User Index [CF] Hinda Kehr, MD     FINAL CLINICAL IMPRESSION(S) / ED DIAGNOSES   Final diagnoses:  Pleural effusion on right  Chronic cough  Edema, unspecified type  Peripheral edema  Dyspnea on exertion  Thrombocytopenia (HCC)  Anemia, unspecified type  Rx / DC Orders   ED Discharge Orders     None        Note:  This document was prepared using Dragon voice recognition software and may include unintentional dictation errors.   Hinda Kehr, MD 04/24/22 [phone]

## 2022-04-24 NOTE — ED Triage Notes (Signed)
Pt has had a cough for the past few months. Pt states she has chest pain and shortness of breath.

## 2022-04-24 NOTE — ED Notes (Signed)
Patient's daughter signed transfer consent at this time.

## 2022-04-24 NOTE — ED Notes (Signed)
Patient ambulatory to restroom independently at this time.

## 2022-04-24 NOTE — ED Notes (Signed)
[facility] called to say they are having staffing issues and no transport is available tonight and no idea if/when one will be available tomorrow.

## 2022-04-25 DIAGNOSIS — D696 Thrombocytopenia, unspecified: Secondary | ICD-10-CM | POA: Diagnosis not present

## 2022-04-25 DIAGNOSIS — R6889 Other general symptoms and signs: Secondary | ICD-10-CM | POA: Diagnosis not present

## 2022-04-25 DIAGNOSIS — Z743 Need for continuous supervision: Secondary | ICD-10-CM | POA: Diagnosis not present

## 2022-04-25 DIAGNOSIS — F17210 Nicotine dependence, cigarettes, uncomplicated: Secondary | ICD-10-CM | POA: Diagnosis not present

## 2022-04-25 DIAGNOSIS — J90 Pleural effusion, not elsewhere classified: Secondary | ICD-10-CM | POA: Diagnosis not present

## 2022-04-25 DIAGNOSIS — R600 Localized edema: Secondary | ICD-10-CM | POA: Diagnosis not present

## 2022-04-25 DIAGNOSIS — D801 Nonfamilial hypogammaglobulinemia: Secondary | ICD-10-CM | POA: Diagnosis not present

## 2022-04-25 DIAGNOSIS — D61818 Other pancytopenia: Secondary | ICD-10-CM | POA: Diagnosis not present

## 2022-04-25 DIAGNOSIS — I361 Nonrheumatic tricuspid (valve) insufficiency: Secondary | ICD-10-CM | POA: Diagnosis not present

## 2022-04-25 DIAGNOSIS — Z859 Personal history of malignant neoplasm, unspecified: Secondary | ICD-10-CM | POA: Diagnosis not present

## 2022-04-25 DIAGNOSIS — J449 Chronic obstructive pulmonary disease, unspecified: Secondary | ICD-10-CM | POA: Diagnosis not present

## 2022-04-25 DIAGNOSIS — E119 Type 2 diabetes mellitus without complications: Secondary | ICD-10-CM | POA: Diagnosis not present

## 2022-04-25 DIAGNOSIS — D649 Anemia, unspecified: Secondary | ICD-10-CM | POA: Diagnosis not present

## 2022-04-25 LAB — BASIC METABOLIC PANEL
Anion gap: 3 — ABNORMAL LOW (ref 5–15)
BUN: 28 mg/dL — ABNORMAL HIGH (ref 8–23)
CO2: 27 mmol/L (ref 22–32)
Calcium: 8.2 mg/dL — ABNORMAL LOW (ref 8.9–10.3)
Chloride: 114 mmol/L — ABNORMAL HIGH (ref 98–111)
Creatinine, Ser: 1.03 mg/dL — ABNORMAL HIGH (ref 0.44–1.00)
GFR, Estimated: 57 mL/min — ABNORMAL LOW (ref >60)
Glucose, Bld: 96 mg/dL (ref 70–99)
Potassium: 4 mmol/L (ref 3.5–5.1)
Sodium: 144 mmol/L (ref 135–145)

## 2022-04-25 LAB — CBC WITH DIFFERENTIAL/PLATELET
Abs Immature Granulocytes: 0.15 10*3/uL — ABNORMAL HIGH (ref 0.00–0.07)
Basophils Absolute: 0 10*3/uL (ref 0.0–0.1)
Basophils Relative: 1 %
Eosinophils Absolute: 0.1 10*3/uL (ref 0.0–0.5)
Eosinophils Relative: 1 %
HCT: 28.3 % — ABNORMAL LOW (ref 36.0–46.0)
Hemoglobin: 8.4 g/dL — ABNORMAL LOW (ref 12.0–15.0)
Immature Granulocytes: 3 %
Lymphocytes Relative: 62 %
Lymphs Abs: 2.9 10*3/uL (ref 0.7–4.0)
MCH: 28.3 pg (ref 26.0–34.0)
MCHC: 29.7 g/dL — ABNORMAL LOW (ref 30.0–36.0)
MCV: 95.3 fL (ref 80.0–100.0)
Monocytes Absolute: 0.4 10*3/uL (ref 0.1–1.0)
Monocytes Relative: 8 %
Neutro Abs: 1.2 10*3/uL — ABNORMAL LOW (ref 1.7–7.7)
Neutrophils Relative %: 25 %
Platelets: 74 10*3/uL — ABNORMAL LOW (ref 150–400)
RBC: 2.97 MIL/uL — ABNORMAL LOW (ref 3.87–5.11)
RDW: 17.2 % — ABNORMAL HIGH (ref 11.5–15.5)
WBC: 4.7 10*3/uL (ref 4.0–10.5)
nRBC: 0.8 % — ABNORMAL HIGH (ref 0.0–0.2)

## 2022-04-25 NOTE — ED Notes (Signed)
Pt up to commode to void.  

## 2022-04-25 NOTE — ED Notes (Signed)
[place] checked for completion °

## 2022-04-25 NOTE — ED Notes (Signed)
Called [facility] about possible transport to [facility], due to staffing issues the earliest would be possibly be mid afternoon at the earliest.

## 2022-04-26 DIAGNOSIS — Z859 Personal history of malignant neoplasm, unspecified: Secondary | ICD-10-CM | POA: Diagnosis not present

## 2022-04-26 DIAGNOSIS — R600 Localized edema: Secondary | ICD-10-CM | POA: Diagnosis not present

## 2022-04-26 DIAGNOSIS — E119 Type 2 diabetes mellitus without complications: Secondary | ICD-10-CM | POA: Diagnosis not present

## 2022-04-26 DIAGNOSIS — D696 Thrombocytopenia, unspecified: Secondary | ICD-10-CM | POA: Diagnosis not present

## 2022-04-26 DIAGNOSIS — F17210 Nicotine dependence, cigarettes, uncomplicated: Secondary | ICD-10-CM | POA: Diagnosis not present

## 2022-04-26 DIAGNOSIS — D649 Anemia, unspecified: Secondary | ICD-10-CM | POA: Diagnosis not present

## 2022-04-26 DIAGNOSIS — J449 Chronic obstructive pulmonary disease, unspecified: Secondary | ICD-10-CM | POA: Diagnosis not present

## 2022-04-26 DIAGNOSIS — J90 Pleural effusion, not elsewhere classified: Secondary | ICD-10-CM | POA: Diagnosis not present

## 2022-04-26 DIAGNOSIS — D61818 Other pancytopenia: Secondary | ICD-10-CM | POA: Diagnosis not present

## 2022-04-28 ENCOUNTER — Telehealth: Payer: Self-pay | Admitting: *Deleted

## 2022-04-28 NOTE — Telephone Encounter (Signed)
Transition Care Management Unsuccessful Follow-up Telephone Call  Date of discharge and from where:   [facility] 04-26-2022  Attempts:  1st Attempt  Reason for unsuccessful TCM follow-up call:  Left voice message

## 2022-04-29 NOTE — Telephone Encounter (Signed)
Transition Care Management Unsuccessful Follow-up Telephone Call  Date of discharge and from where:  [facility] 04-26-2022   Attempts:  2nd Attempt  Reason for unsuccessful TCM follow-up call:  Left voice message

## 2022-04-30 DIAGNOSIS — L049 Acute lymphadenitis, unspecified: Secondary | ICD-10-CM | POA: Diagnosis not present

## 2022-05-07 DIAGNOSIS — L049 Acute lymphadenitis, unspecified: Secondary | ICD-10-CM | POA: Diagnosis not present

## 2022-05-07 DIAGNOSIS — Z7189 Other specified counseling: Secondary | ICD-10-CM | POA: Diagnosis not present

## 2022-05-07 DIAGNOSIS — M26609 Unspecified temporomandibular joint disorder, unspecified side: Secondary | ICD-10-CM | POA: Diagnosis not present

## 2022-05-07 DIAGNOSIS — J441 Chronic obstructive pulmonary disease with (acute) exacerbation: Secondary | ICD-10-CM | POA: Diagnosis not present

## 2022-05-12 ENCOUNTER — Encounter: Payer: Self-pay | Admitting: Nurse Practitioner

## 2022-05-12 ENCOUNTER — Ambulatory Visit (INDEPENDENT_AMBULATORY_CARE_PROVIDER_SITE_OTHER): Payer: Medicare Other | Admitting: Nurse Practitioner

## 2022-05-12 ENCOUNTER — Inpatient Hospital Stay: Payer: Medicare Other | Admitting: Nurse Practitioner

## 2022-05-12 VITALS — BP 111/66 | HR 70 | Temp 98.4°F | Wt 157.8 lb

## 2022-05-12 DIAGNOSIS — J90 Pleural effusion, not elsewhere classified: Secondary | ICD-10-CM | POA: Insufficient documentation

## 2022-05-12 DIAGNOSIS — D61818 Other pancytopenia: Secondary | ICD-10-CM | POA: Diagnosis not present

## 2022-05-12 DIAGNOSIS — R600 Localized edema: Secondary | ICD-10-CM

## 2022-05-12 DIAGNOSIS — Z09 Encounter for follow-up examination after completed treatment for conditions other than malignant neoplasm: Secondary | ICD-10-CM | POA: Diagnosis not present

## 2022-05-12 DIAGNOSIS — C8590 Non-Hodgkin lymphoma, unspecified, unspecified site: Secondary | ICD-10-CM

## 2022-05-12 DIAGNOSIS — J449 Chronic obstructive pulmonary disease, unspecified: Secondary | ICD-10-CM | POA: Diagnosis not present

## 2022-05-12 MED ORDER — FUROSEMIDE 20 MG PO TABS: 10.0000 mg | ORAL_TABLET | Freq: Every day | ORAL | 0 refills | Status: DC

## 2022-05-12 NOTE — Assessment & Plan Note (Signed)
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  Declined referral to Hematology due to platelets and anemia.  Patient declined.  Discussed with patient that she would not have to do treatment for lymphoma but can receive treatment for blood disorders.  Patient still declines.

## 2022-05-12 NOTE — Progress Notes (Signed)
BP 111/66   Pulse 70   Temp 98.4 F (36.9 C) (Oral)   Wt 157 lb 12.8 oz (71.6 kg)   SpO2 96%   BMI 27.96 kg/m    Subjective:    Patient ID: Natasha [name], female    DOB: 06/23/1946, 76 y.o.   MRN: [id number]  HPI: Natasha [name] is a 76 y.o. female  Chief Complaint  Patient presents with   COPD   Edema    Bilateral leg swelling , states vessels in her legs burst .    Transition of [facility] Follow up.   Hospital/Facility: [facility] Physician: Unknown D/C Date: 04/26/2022  Records Requested: NA Records Received: Yes Records Reviewed: Yes  Diagnoses on Discharge:   #Pleural effusion- likely related to malignancy. States her breathing is a lot better since she had the fluid drained.   #Lymphoma #Pancytopenia Patient initially presented to [facility] health with a month of progressive cough. CT at that time showed a large pleural effusion and a small pneumothorax. She has a very confusing history of lymphoma and leukemia patient states that she had both lymphoma and leukemia when she was in [place], nobody has been able to access those records and she states she never got treatment as she was saved by the [facility]. When she moved to [place] she was seen by oncology due to CT findings concerning for lymphoma with diffuse lymphadenopathy. She had a lymph node biopsy at that time which was negative for malignancy but oncology recommended a another lymph node biopsy given such a high suspicion for lymphoma. She declined further work-up and does not appear to have interacted with oncology since then. Of note was recently found to be pancytopenic. Her story is very concerning for an underlying malignancy. Given her pancytopenia suspect some sort of hematologic malignancy and have sent work-up as detailed below. 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 Will likely need to involve oncology in the next day or so. Reassuringly  she remains clinically stable and is no longer requiring oxygen. She became upset when I mentioned the need for a diagnostic thora and states she was hold she could just have all of the fluid drained from her lung and go home. I explained to ger our concern that she may have an underlying malignancy and she became upset and called me a liar. After continued discussion she agreed to go forward with the diagnostic para. She initially requested to be put fully under for the procedure and when I explained that was not an option, she was initially upset but then agreeable to have thora done with our procedure team.   # LE edema  Has bilateral lower extremity edema that has been worsening in the last couple of weeks. BNP 336 at OSH. Have low suspicion she has heart failure but given the clinical scenario she likely needs an echo anyway for malignancy work-up so have ordered complete echo.  #Type 2 diabetes Appears to have been on metformin at some point, has stopped taking it '[ ]'  A1c  #Dementia Has a chart diagnosis of dementia, does not appear to have a recent Bruning. Would benefit from PT/OT. She has very poor insight and it is hard to assess capacity given her refusal to engage an any conversations about cancer. Her daughter will be at the hospital later today and would be beneficial to discuss things with her, as per patient, appears her daughter  was the main reason she agreed to even come to [facility] as she wanted to stay home and not be treated.   # nicotine dependence  # COPD  Smokes 1/2 ppd    Biggest issue at visit today is her lower extremity edema.  States they are painful and get so full blood starts to come out.   Denies HA, CP, SOB, dizziness, palpitations, visual changes, and lower extremity swelling.   Date of interactive Contact within 48 hours of discharge:  Contact was through:  2 unsuccessful phone call attempts  Date of 7 day or 14 day face-to-face visit:    within 14  days  Outpatient Encounter Medications as of 05/12/2022  Medication Sig Note   albuterol (VENTOLIN HFA) 108 (90 Base) MCG/ACT inhaler Inhale 2 puffs into the lungs every 6 (six) hours as needed for wheezing or shortness of breath.    cyclobenzaprine (FLEXERIL) 10 MG tablet Take 10 mg by mouth at bedtime.    furosemide (LASIX) 20 MG tablet Take 0.5 tablets (10 mg total) by mouth daily for 7 days. T    umeclidinium-vilanterol (ANORO ELLIPTA) 62.5-25 MCG/ACT AEPB Inhale 1 puff into the lungs daily at 6 (six) AM.    [DISCONTINUED] baclofen (LIORESAL) 10 MG tablet Take 10 mg by mouth 3 (three) times daily. (Patient not taking: Reported on 05/12/2022) 04/24/2022: Patient states Rx was never called in to her pharmacy.   [DISCONTINUED] furosemide (LASIX) 20 MG tablet Take 0.5 tablets (10 mg total) by mouth daily for 7 days.    No facility-administered encounter medications on file as of 05/12/2022.    Diagnostic Tests Reviewed/Disposition: Reviewed  Consults: Needs referral to Hematology/Oncology, Cardiothoracic surgery, and Palliative  Discharge Instructions: Reviewed  Disease/illness Education: Provided during visit  Home Health/Community Services Discussions/Referrals: Discussed referral to palliative  Establishment or re-establishment of referral orders for community resources: NA  Discussion with other health care providers: NA  Assessment and Support of treatment regimen adherence:  Appointments Coordinated with: Patient  Education for self-management, independent living, and ADLs: Assessed at visit   Relevant past medical, surgical, family and social history reviewed and updated as indicated. Interim medical history since our last visit reviewed. Allergies and medications reviewed and updated.  Review of Systems  Eyes:  Negative for visual disturbance.  Respiratory:  Negative for cough, chest tightness and shortness of breath.   Cardiovascular:  Positive for leg swelling. Negative  for chest pain and palpitations.  Neurological:  Negative for dizziness and headaches.    Per HPI unless specifically indicated above     Objective:    BP 111/66   Pulse 70   Temp 98.4 F (36.9 C) (Oral)   Wt 157 lb 12.8 oz (71.6 kg)   SpO2 96%   BMI 27.96 kg/m   Wt Readings from Last 3 Encounters:  05/12/22 157 lb 12.8 oz (71.6 kg)  04/16/22 163 lb 6.4 oz (74.1 kg)  04/09/22 161 lb 6.4 oz (73.2 kg)    Physical Exam Vitals and nursing note reviewed.  Constitutional:      General: She is not in acute distress.    Appearance: Normal appearance. She is normal weight. She is not ill-appearing, toxic-appearing or diaphoretic.  HENT:     Head: Normocephalic.     Right Ear: External ear normal.     Left Ear: External ear normal.     Nose: Nose normal.     Mouth/Throat:     Mouth: Mucous membranes are moist.  Pharynx: Oropharynx is clear.  Eyes:     General:        Right eye: No discharge.        Left eye: No discharge.     Extraocular Movements: Extraocular movements intact.     Conjunctiva/sclera: Conjunctivae normal.     Pupils: Pupils are equal, round, and reactive to light.  Cardiovascular:     Rate and Rhythm: Normal rate and regular rhythm.     Heart sounds: No murmur heard. Pulmonary:     Effort: Pulmonary effort is normal. No respiratory distress.     Breath sounds: Normal breath sounds. No wheezing or rales.  Musculoskeletal:     Cervical back: Normal range of motion and neck supple.     Right lower leg: 2+ Pitting Edema present.     Left lower leg: 2+ Pitting Edema present.  Skin:    General: Skin is warm and dry.     Capillary Refill: Capillary refill takes less than 2 seconds.  Neurological:     General: No focal deficit present.     Mental Status: She is alert and oriented to person, place, and time. Mental status is at baseline.  Psychiatric:        Mood and Affect: Mood normal.        Behavior: Behavior normal.        Thought Content: Thought  content normal.        Judgment: Judgment normal.     Results for orders placed or performed during the hospital encounter of 77/41/28  Basic metabolic panel  Result Value Ref Range   Sodium 143 135 - 145 mmol/L   Potassium 3.8 3.5 - 5.1 mmol/L   Chloride 111 98 - 111 mmol/L   CO2 26 22 - 32 mmol/L   Glucose, Bld 263 (H) 70 - 99 mg/dL   BUN 31 (H) 8 - 23 mg/dL   Creatinine, Ser 1.00 0.44 - 1.00 mg/dL   Calcium 8.7 (L) 8.9 - 10.3 mg/dL   GFR, Estimated 59 (L) >60 mL/min   Anion gap 6 5 - 15  CBC  Result Value Ref Range   WBC 9.6 4.0 - 10.5 K/uL   RBC 2.97 (L) 3.87 - 5.11 MIL/uL   Hemoglobin 8.5 (L) 12.0 - 15.0 g/dL   HCT 28.1 (L) 36.0 - 46.0 %   MCV 94.6 80.0 - 100.0 fL   MCH 28.6 26.0 - 34.0 pg   MCHC 30.2 30.0 - 36.0 g/dL   RDW 16.6 (H) 11.5 - 15.5 %   Platelets 85 (L) 150 - 400 K/uL   nRBC 0.8 (H) 0.0 - 0.2 %  Brain natriuretic peptide  Result Value Ref Range   B Natriuretic Peptide 332.8 (H) 0.0 - 100.0 pg/mL  CBC with Differential  Result Value Ref Range   WBC 4.7 4.0 - 10.5 K/uL   RBC 2.97 (L) 3.87 - 5.11 MIL/uL   Hemoglobin 8.4 (L) 12.0 - 15.0 g/dL   HCT 28.3 (L) 36.0 - 46.0 %   MCV 95.3 80.0 - 100.0 fL   MCH 28.3 26.0 - 34.0 pg   MCHC 29.7 (L) 30.0 - 36.0 g/dL   RDW 17.2 (H) 11.5 - 15.5 %   Platelets 74 (L) 150 - 400 K/uL   nRBC 0.8 (H) 0.0 - 0.2 %   Neutrophils Relative % 25 %   Neutro Abs 1.2 (L) 1.7 - 7.7 K/uL   Lymphocytes Relative 62 %   Lymphs Abs 2.9 0.7 -  4.0 K/uL   Monocytes Relative 8 %   Monocytes Absolute 0.4 0.1 - 1.0 K/uL   Eosinophils Relative 1 %   Eosinophils Absolute 0.1 0.0 - 0.5 K/uL   Basophils Relative 1 %   Basophils Absolute 0.0 0.0 - 0.1 K/uL   Immature Granulocytes 3 %   Abs Immature Granulocytes 0.15 (H) 0.00 - 0.07 K/uL  Basic metabolic panel  Result Value Ref Range   Sodium 144 135 - 145 mmol/L   Potassium 4.0 3.5 - 5.1 mmol/L   Chloride 114 (H) 98 - 111 mmol/L   CO2 27 22 - 32 mmol/L   Glucose, Bld 96 70 - 99 mg/dL    BUN 28 (H) 8 - 23 mg/dL   Creatinine, Ser 1.03 (H) 0.44 - 1.00 mg/dL   Calcium 8.2 (L) 8.9 - 10.3 mg/dL   GFR, Estimated 57 (L) >60 mL/min   Anion gap 3 (L) 5 - 15  Troponin I (High Sensitivity)  Result Value Ref Range   Troponin I (High Sensitivity) 5 <18 ng/L  Troponin I (High Sensitivity)  Result Value Ref Range   Troponin I (High Sensitivity) 6 <18 ng/L      Assessment & Plan:   Problem List Items Addressed This Visit       Respiratory   Pleural effusion    Symptoms improved since hospitalization.  Discussed that symptoms will likely reoccur due to fluid being from Malignancy.  Offered to refer patient to Cardiothoracic surgery to help with management but she declined.  She would rather go back to the ED for treatment.         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  Declined referral to Hematology due to platelets and anemia.  Patient declined.  Discussed with patient that she would not have to do treatment for lymphoma but can receive treatment for blood disorders.  Patient still declines.      Relevant Orders   CBC w/Diff   Comp Met (CMET)   Amb Referral to Palliative Care     Other   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HCC)    Ongoing. Patient declines referral to Hematology/Oncology.  Persistent in declining treatment.  Does agree to Palliative Care referral which was placed during visit.        Relevant Orders   CBC w/Diff   Comp Met (CMET)   Amb Referral to Palliative Care   Lower extremity edema    Will start Lasix 39m daily for 1 week.  Discussed that it may or may not help due to it likely being third space. ECHO was normal and LE edema not related to cardiac.  CMP repeated to check potassium.  Will recheck potassium at next visit.  Will hold off on Spirnolactone due to lower end BP.  Discussed increasing protein intake to increase albumin levels and improve swelling.  Recommend compression stockings to help with  swelling.  Follow up in 1 week.      Other Visit Diagnoses     Hospital discharge follow-up    -  Primary   Discharge instrucitons reviewed. Declines treatment for lymphoma and pancytopenia.  Referral placed for palliative.        Follow up plan: Return in about 1 week (around 05/19/2022) for LE swelling.

## 2022-05-12 NOTE — Assessment & Plan Note (Signed)
Ongoing. Patient declines referral to Hematology/Oncology.  Persistent in declining treatment.  Does agree to Palliative Care referral which was placed during visit.

## 2022-05-12 NOTE — Assessment & Plan Note (Signed)
Symptoms improved since hospitalization.  Discussed that symptoms will likely reoccur due to fluid being from Malignancy.  Offered to refer patient to Cardiothoracic surgery to help with management but she declined.  She would rather go back to the ED for treatment.

## 2022-05-12 NOTE — Assessment & Plan Note (Signed)
Will start Lasix '10mg'$  daily for 1 week.  Discussed that it may or may not help due to it likely being third space. ECHO was normal and LE edema not related to cardiac.  CMP repeated to check potassium.  Will recheck potassium at next visit.  Will hold off on Spirnolactone due to lower end BP.  Discussed increasing protein intake to increase albumin levels and improve swelling.  Recommend compression stockings to help with swelling.  Follow up in 1 week.

## 2022-05-13 ENCOUNTER — Telehealth: Payer: Self-pay

## 2022-05-13 LAB — CBC WITH DIFFERENTIAL/PLATELET
Basophils Absolute: 0 10*3/uL (ref 0.0–0.2)
Basos: 1 %
EOS (ABSOLUTE): 0.1 10*3/uL (ref 0.0–0.4)
Eos: 1 %
Hematocrit: 29.1 % — ABNORMAL LOW (ref 34.0–46.6)
Hemoglobin: 9.4 g/dL — ABNORMAL LOW (ref 11.1–15.9)
Immature Grans (Abs): 0.1 10*3/uL (ref 0.0–0.1)
Immature Granulocytes: 1 %
Lymphocytes Absolute: 2.5 10*3/uL (ref 0.7–3.1)
Lymphs: 66 %
MCH: 28.8 pg (ref 26.6–33.0)
MCHC: 32.3 g/dL (ref 31.5–35.7)
MCV: 89 fL (ref 79–97)
Monocytes Absolute: 0.6 10*3/uL (ref 0.1–0.9)
Monocytes: 15 %
NRBC: 1 % — ABNORMAL HIGH (ref 0–0)
Neutrophils Absolute: 0.6 10*3/uL — ABNORMAL LOW (ref 1.4–7.0)
Neutrophils: 16 %
Platelets: 70 10*3/uL — CL (ref 150–450)
RBC: 3.26 x10E6/uL — ABNORMAL LOW (ref 3.77–5.28)
RDW: 16 % — ABNORMAL HIGH (ref 11.7–15.4)
WBC: 3.8 10*3/uL (ref 3.4–10.8)

## 2022-05-13 LAB — COMPREHENSIVE METABOLIC PANEL
ALT: 9 IU/L (ref 0–32)
AST: 21 IU/L (ref 0–40)
Albumin/Globulin Ratio: 2.6 — ABNORMAL HIGH (ref 1.2–2.2)
Albumin: 3.7 g/dL — ABNORMAL LOW (ref 3.8–4.8)
Alkaline Phosphatase: 146 IU/L — ABNORMAL HIGH (ref 44–121)
BUN/Creatinine Ratio: 18 (ref 12–28)
BUN: 19 mg/dL (ref 8–27)
Bilirubin Total: 1.3 mg/dL — ABNORMAL HIGH (ref 0.0–1.2)
CO2: 22 mmol/L (ref 20–29)
Calcium: 9.5 mg/dL (ref 8.7–10.3)
Chloride: 106 mmol/L (ref 96–106)
Creatinine, Ser: 1.03 mg/dL — ABNORMAL HIGH (ref 0.57–1.00)
Globulin, Total: 1.4 g/dL — ABNORMAL LOW (ref 1.5–4.5)
Glucose: 133 mg/dL — ABNORMAL HIGH (ref 70–99)
Potassium: 3.9 mmol/L (ref 3.5–5.2)
Sodium: 144 mmol/L (ref 134–144)
Total Protein: 5.1 g/dL — ABNORMAL LOW (ref 6.0–8.5)
eGFR: 57 mL/min/{1.73_m2} — ABNORMAL LOW (ref >59)

## 2022-05-13 NOTE — Progress Notes (Signed)
Please let patient know that her lab work shows that her platelets are extremely low.  I still recommend she see Hematology for this.  Her albumin which is a protein is low.  I also recommend she increase her protein intake as discussed yesterday. If she can add in a protein drink at least daily this could help with her swelling.

## 2022-05-13 NOTE — Telephone Encounter (Signed)
Spoke with patient and scheduled a in person per request Palliative Consult for 05/20/22 @ 2:15 PM.  Consent obtained; updated Netsmart, Team List and Epic.

## 2022-05-18 ENCOUNTER — Other Ambulatory Visit: Payer: Medicare Other | Admitting: Student

## 2022-05-18 DIAGNOSIS — Z720 Tobacco use: Secondary | ICD-10-CM | POA: Diagnosis not present

## 2022-05-18 DIAGNOSIS — Z515 Encounter for palliative care: Secondary | ICD-10-CM | POA: Diagnosis not present

## 2022-05-18 DIAGNOSIS — M79605 Pain in left leg: Secondary | ICD-10-CM

## 2022-05-18 DIAGNOSIS — C8590 Non-Hodgkin lymphoma, unspecified, unspecified site: Secondary | ICD-10-CM | POA: Diagnosis not present

## 2022-05-18 DIAGNOSIS — R600 Localized edema: Secondary | ICD-10-CM | POA: Diagnosis not present

## 2022-05-18 DIAGNOSIS — M79604 Pain in right leg: Secondary | ICD-10-CM | POA: Diagnosis not present

## 2022-05-18 DIAGNOSIS — R0602 Shortness of breath: Secondary | ICD-10-CM

## 2022-05-18 NOTE — Progress Notes (Unsigned)
   There were no vitals taken for this visit.   Subjective:    Patient ID: Natasha [name], female    DOB: 1945/12/01, 76 y.o.   MRN: [id number]  HPI: Natasha [name] is a 76 y.o. female  No chief complaint on file.  LOWER EXTREMITY SWELLING   Relevant past medical, surgical, family and social history reviewed and updated as indicated. Interim medical history since our last visit reviewed. Allergies and medications reviewed and updated.  Review of Systems  Per HPI unless specifically indicated above     Objective:    There were no vitals taken for this visit.  Wt Readings from Last 3 Encounters:  05/12/22 157 lb 12.8 oz (71.6 kg)  04/16/22 163 lb 6.4 oz (74.1 kg)  04/09/22 161 lb 6.4 oz (73.2 kg)    Physical Exam  Results for orders placed or performed in visit on 05/12/22  CBC w/Diff  Result Value Ref Range   WBC 3.8 3.4 - 10.8 x10E3/uL   RBC 3.26 (L) 3.77 - 5.28 x10E6/uL   Hemoglobin 9.4 (L) 11.1 - 15.9 g/dL   Hematocrit 29.1 (L) 34.0 - 46.6 %   MCV 89 79 - 97 fL   MCH 28.8 26.6 - 33.0 pg   MCHC 32.3 31.5 - 35.7 g/dL   RDW 16.0 (H) 11.7 - 15.4 %   Platelets 70 (LL) 150 - 450 x10E3/uL   Neutrophils 16 Not Estab. %   Lymphs 66 Not Estab. %   Monocytes 15 Not Estab. %   Eos 1 Not Estab. %   Basos 1 Not Estab. %   Neutrophils Absolute 0.6 (L) 1.4 - 7.0 x10E3/uL   Lymphocytes Absolute 2.5 0.7 - 3.1 x10E3/uL   Monocytes Absolute 0.6 0.1 - 0.9 x10E3/uL   EOS (ABSOLUTE) 0.1 0.0 - 0.4 x10E3/uL   Basophils Absolute 0.0 0.0 - 0.2 x10E3/uL   Immature Granulocytes 1 Not Estab. %   Immature Grans (Abs) 0.1 0.0 - 0.1 x10E3/uL   NRBC 1 (H) 0 - 0 %   Hematology Comments: Note:   Comp Met (CMET)  Result Value Ref Range   Glucose 133 (H) 70 - 99 mg/dL   BUN 19 8 - 27 mg/dL   Creatinine, Ser 1.03 (H) 0.57 - 1.00 mg/dL   eGFR 57 (L) >59 mL/min/1.73   BUN/Creatinine Ratio 18 12 - 28   Sodium 144 134 - 144 mmol/L   Potassium 3.9 3.5 - 5.2 mmol/L   Chloride 106 96 - 106  mmol/L   CO2 22 20 - 29 mmol/L   Calcium 9.5 8.7 - 10.3 mg/dL   Total Protein 5.1 (L) 6.0 - 8.5 g/dL   Albumin 3.7 (L) 3.8 - 4.8 g/dL   Globulin, Total 1.4 (L) 1.5 - 4.5 g/dL   Albumin/Globulin Ratio 2.6 (H) 1.2 - 2.2   Bilirubin Total 1.3 (H) 0.0 - 1.2 mg/dL   Alkaline Phosphatase 146 (H) 44 - 121 IU/L   AST 21 0 - 40 IU/L   ALT 9 0 - 32 IU/L      Assessment & Plan:   Problem List Items Addressed This Visit   None    Follow up plan: No follow-ups on file.

## 2022-05-18 NOTE — Progress Notes (Signed)
[facility] Palliative Care Consult Note Telephone: [phone]  Fax: [phone]   Date of encounter: 05/18/22 2:15 PM PATIENT NAME: Natasha [name] [address] [place] [address]   [phone] (home)  DOB: 03/30/1946 MRN: [id number] PRIMARY CARE PROVIDER:    Jon Billings, NP,  [place] [address] [phone]  REFERRING PROVIDER:   Jon Billings, NP [address] [place],  [place] [address] [phone]  RESPONSIBLE PARTY:    Contact Information     Name Relation Home Work Mobile   Natasha [name] Daughter   [place]   [phone]        I met face to face with patient in the home. Palliative Care was asked to follow this patient by consultation request of  Natasha Billings, NP to address advance care planning and complex medical decision making. This is the initial visit.                                     ASSESSMENT AND PLAN / RECOMMENDATIONS:   Advance Care Planning/Goals of Care: Goals include to maximize quality of life and symptom management. Patient/health care surrogate gave his/her permission to discuss.Our advance care planning conversation included a discussion about:    The value and importance of advance care planning  Experiences with loved ones who have been seriously ill or have died  Exploration of personal, cultural or spiritual beliefs that might influence medical decisions  Exploration of goals of care in the event of a sudden injury or illness  CODE STATUS:  Full Code, would want CPR attempted but unsure of ventilation.   Education provided on Palliative Medicine. Introduced MOST form. Patient states she is open to hospitalization, but does not want any further workup. She does express that she would be open to blood transfusions if she needed them. 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 Attempted to provide education  on her disease processes. Patient does not express a clear understanding of her diagnoses. She does state daughter Natasha [name] can speak on her behalf. We discussed HCPOA and encourage this be completed. We discussed daughter being present at next palliative visit. Patient denies any needs/community resources today other than needing furniture. Will refer to palliative SW.  Symptom Management/Plan:  Lymphoma, hx of leukemia?-patient declines further work up, declines oncology referral. Will continue to provide education, monitor for symptoms. Will provide ongoing education.   Shortness of breath-treated for pleural effusion; suspected malignant in nature. She declines any further work up. She is open to having thoracentesis if needed. We discussed possibility of recurrence. Patient reports some improvement in her shortness of breath. She does endorse shortness of breath with exertion. Education provided on using her inhalers.   Smoking cessation-patient reports smoking 1/2 pack per day, expresses not ready to quit. Education provided on smoking cessation; she denies any intervention.   LE edema and pain-patient has trialed furosemide. Continue acetaminophen for pain. No improvement so far. Recommend compression stockings; patient declines to wear. Encourage patient to elevate BLE. She is to follow up with PCP.   Follow up Palliative Care Visit: Palliative care will continue to follow for complex medical decision making, advance care planning, and clarification of goals. Return in 8 weeks or prn.  I spent 60 minutes providing this consultation. More than 50% of the time  in this consultation was spent in counseling and care coordination.   PPS: 70%  HOSPICE ELIGIBILITY/DIAGNOSIS: TBD  Chief Complaint: Palliative Medicine initial consult.   HISTORY OF PRESENT ILLNESS:  Natasha [name] is a 76 y.o. year old female  with malignant lymphoma, pleural effusion, LE edema, hospitalized  7/2-04/26/22 due to pleural effusion s/p thoracentesis.   Has been in [place] 6 years. Previously was told she had leukemia? And lymphoma, but was healed in [place] and does not want to have further work up. She endorses leg swelling and pain, currently receiving furosemide. Appetite is good; although endorses weight loss of 5 pounds recently. She reports shortness of breath is a lot better since hospitalization. Occasional cough now; had been coughing up grey phlegm.   History obtained from review of EMR, discussion with primary team, and interview with family, facility staff/caregiver and/or Natasha [name].  I reviewed available labs, medications, imaging, studies and related documents from the EMR.  Records reviewed and summarized above.   ROS  A 10 Point ROS is negative, except for the pertinent positives detailed per the HPI.   Physical Exam: Constitutional: NAD General: frail appearing EYES: anicteric sclera, lids intact, no discharge  ENMT: intact hearing, oral mucous membranes moist, dentition intact CV: S1S2, RRR, 2+ LE edema Pulmonary: Right lobes diminished, no increased work of breathing, occasional cough, room air Abdomen:  normo-active BS + 4 quadrants, soft and non tender, no ascites GU: deferred MSK: moves all extremities, ambulatory Skin: warm and dry, no rashes or wounds on visible skin Neuro:  no generalized weakness, A & O x 3 Psych: non-anxious affect, cooperative Hem/lymph/immuno: no widespread bruising CURRENT PROBLEM LIST:  Patient Active Problem List   Diagnosis Date Noted   Pleural effusion 05/12/2022   Lower extremity edema 05/12/2022   Pancytopenia ([place]) 04/16/2022   Type 2 diabetes mellitus with proteinuria ([place]) 04/09/2022   Atherosclerosis of aorta ([place]) 04/09/2022   COPD (chronic obstructive pulmonary disease) ([facility]) 04/09/2022   SOB (shortness of breath) on exertion 04/09/2022   Acute cough 04/09/2022   Cigarette nicotine dependence, uncomplicated 28/78/6767    Post herpetic neuralgia 03/14/2020   Gall stones 10/23/2019   Generalized lymphadenopathy 09/08/2018   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[place]) 09/08/2018   Primary osteoarthritis of both knees 07/07/2018   Senile purpura ([place]) 07/07/2018   PAST MEDICAL HISTORY:  Active Ambulatory Problems    Diagnosis Date Noted   Primary osteoarthritis of both knees 07/07/2018   Senile purpura ([place]) 07/07/2018   Generalized lymphadenopathy 09/08/2018   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[place]) 09/08/2018   Post herpetic neuralgia 03/14/2020   Cigarette nicotine dependence, uncomplicated 20/94/7096   Gall stones 10/23/2019   Type 2 diabetes mellitus with proteinuria ([place]) 04/09/2022   Atherosclerosis of aorta ([place]) 04/09/2022   COPD (chronic obstructive pulmonary disease) ([place]) 04/09/2022   SOB (shortness of breath) on exertion 04/09/2022   Acute cough 04/09/2022   Pancytopenia ([place]) 04/16/2022   Pleural effusion 05/12/2022   Lower extremity edema 05/12/2022   Resolved Ambulatory Problems    Diagnosis Date Noted   Controlled type 2 diabetes mellitus without complication, without long-term current use of insulin ([place]) 07/07/2018   Leg cramping 07/07/2018   SI (sacroiliac) joint inflammation ([place]) 08/14/2018   Nail abnormality 08/14/2020   Lung cancer screening declined by patient 08/14/2020   Lymphoma ([place]) 10/23/2019   Past Medical History:  Diagnosis Date   Cancer ([employer] [employer])    Cancer ([place])    Diabetes mellitus without complication ([place])  Vaginal delivery    SOCIAL HX:  Social History   Tobacco Use   Smoking status: Every Day    Packs/day: 1.00    Years: 55.00    Total pack years: 55.00    Types: Cigarettes    Start date: 07/07/1961   Smokeless tobacco: Never  Substance Use Topics   Alcohol use: No   FAMILY HX:  Family History  Problem Relation Age of Onset   Breast cancer Mother    Alzheimer's disease Father    Lung cancer Father        lung   Varicose Veins Brother    Heart attack  Brother       ALLERGIES: No Known Allergies   PERTINENT MEDICATIONS:  Outpatient Encounter Medications as of 05/18/2022  Medication Sig   albuterol (VENTOLIN HFA) 108 (90 Base) MCG/ACT inhaler Inhale 2 puffs into the lungs every 6 (six) hours as needed for wheezing or shortness of breath.   cyclobenzaprine (FLEXERIL) 10 MG tablet Take 10 mg by mouth at bedtime.   furosemide (LASIX) 20 MG tablet Take 0.5 tablets (10 mg total) by mouth daily for 7 days. T   umeclidinium-vilanterol (ANORO ELLIPTA) 62.5-25 MCG/ACT AEPB Inhale 1 puff into the lungs daily at 6 (six) AM.   No facility-administered encounter medications on file as of 05/18/2022.   Thank you for the opportunity to participate in the care of Ms. Olivarez.  The palliative care team will continue to follow. Please call our office at [phone] if we can be of additional assistance.   Ezekiel Slocumb, NP   COVID-19 PATIENT SCREENING TOOL Asked and negative response unless otherwise noted:  Have you had symptoms of covid, tested positive or been in contact with someone with symptoms/positive test in the past 5-10 days? No

## 2022-05-19 ENCOUNTER — Encounter: Payer: Self-pay | Admitting: Nurse Practitioner

## 2022-05-19 ENCOUNTER — Ambulatory Visit (INDEPENDENT_AMBULATORY_CARE_PROVIDER_SITE_OTHER): Payer: Medicare Other | Admitting: Nurse Practitioner

## 2022-05-19 VITALS — BP 105/66 | HR 84 | Temp 98.1°F | Wt 155.5 lb

## 2022-05-19 DIAGNOSIS — R600 Localized edema: Secondary | ICD-10-CM | POA: Diagnosis not present

## 2022-05-19 DIAGNOSIS — R051 Acute cough: Secondary | ICD-10-CM | POA: Diagnosis not present

## 2022-05-19 MED ORDER — AZITHROMYCIN 250 MG PO TABS: ORAL_TABLET | ORAL | 0 refills | Status: AC

## 2022-05-19 NOTE — Assessment & Plan Note (Signed)
Likely related to malignancy in lung causing fluid build up.  Will treat with azithromycin due to patient suspecting it is infectious in nature.  Not able to get chest xray due to limited transportation.  Follow up in 1 month for reevaluation.  Call sooner if concerns arise.

## 2022-05-19 NOTE — Assessment & Plan Note (Signed)
Ongoing. Not improved with Lasix due to third spacing.  Discussed importance of compression stockings and increased protein.  Will reach out to palliative regarding patient's symptoms to discuss possible treatment options.  Patient declined Cardiothoracic surgery referral.

## 2022-05-20 ENCOUNTER — Other Ambulatory Visit: Payer: Medicare Other | Admitting: Student

## 2022-05-21 DIAGNOSIS — Z7189 Other specified counseling: Secondary | ICD-10-CM | POA: Diagnosis not present

## 2022-05-21 DIAGNOSIS — D61818 Other pancytopenia: Secondary | ICD-10-CM | POA: Diagnosis not present

## 2022-05-21 DIAGNOSIS — J441 Chronic obstructive pulmonary disease with (acute) exacerbation: Secondary | ICD-10-CM | POA: Diagnosis not present

## 2022-05-24 ENCOUNTER — Telehealth: Payer: Self-pay

## 2022-05-24 NOTE — Telephone Encounter (Signed)
PC SW outreached ptient per PC NP - L. Rivers SW referral request.   Call unsuccessful. SW LVM with contact information.

## 2022-05-28 DIAGNOSIS — R609 Edema, unspecified: Secondary | ICD-10-CM | POA: Diagnosis not present

## 2022-06-01 DIAGNOSIS — R609 Edema, unspecified: Secondary | ICD-10-CM | POA: Diagnosis not present

## 2022-06-04 ENCOUNTER — Other Ambulatory Visit: Payer: Self-pay | Admitting: Nurse Practitioner

## 2022-06-04 DIAGNOSIS — R609 Edema, unspecified: Secondary | ICD-10-CM | POA: Diagnosis not present

## 2022-06-04 DIAGNOSIS — R059 Cough, unspecified: Secondary | ICD-10-CM | POA: Diagnosis not present

## 2022-06-04 NOTE — Telephone Encounter (Signed)
Requested Prescriptions  Pending Prescriptions Disp Refills  . albuterol (VENTOLIN HFA) 108 (90 Base) MCG/ACT inhaler [Pharmacy Med Name: ALBUTEROL HFA (PROAIR) INHALER] 8.5 each 1    Sig: TAKE 2 PUFFS BY MOUTH EVERY 6 HOURS AS NEEDED FOR WHEEZE OR SHORTNESS OF BREATH     Pulmonology:  Beta Agonists 2 Passed - 06/04/2022  2:14 AM      Passed - Last BP in normal range    BP Readings from Last 1 Encounters:  05/19/22 105/66         Passed - Last Heart Rate in normal range    Pulse Readings from Last 1 Encounters:  05/19/22 84         Passed - Valid encounter within last 12 months    Recent Outpatient Visits          2 weeks ago Lower extremity edema   [facility] Jon Billings, NP   3 weeks ago Hospital discharge follow-up   [facility] Jon Billings, NP   1 month ago Low grade malignant lymphoma [facility])   [place], [place], DO   1 month ago 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[place])   [place] Cannady, Jolene T, NP   9 months ago Controlled type 2 diabetes mellitus without complication, without long-term current use of insulin ([place])   [place], Karen, NP      Future Appointments            In 2 weeks Jon Billings, NP [facility] [place]

## 2022-06-16 ENCOUNTER — Other Ambulatory Visit: Payer: Self-pay

## 2022-06-16 ENCOUNTER — Emergency Department: Payer: Medicare Other

## 2022-06-16 ENCOUNTER — Observation Stay
Admission: EM | Admit: 2022-06-16 | Discharge: 2022-06-17 | Disposition: A | Discharge status: Home / Self Care | Payer: Medicare Other | Attending: Internal Medicine | Admitting: Internal Medicine

## 2022-06-16 DIAGNOSIS — R0602 Shortness of breath: Secondary | ICD-10-CM | POA: Diagnosis not present

## 2022-06-16 DIAGNOSIS — C9590 Leukemia, unspecified not having achieved remission: Secondary | ICD-10-CM | POA: Insufficient documentation

## 2022-06-16 DIAGNOSIS — R822 Biliuria: Secondary | ICD-10-CM | POA: Insufficient documentation

## 2022-06-16 DIAGNOSIS — R911 Solitary pulmonary nodule: Secondary | ICD-10-CM | POA: Diagnosis not present

## 2022-06-16 DIAGNOSIS — R069 Unspecified abnormalities of breathing: Secondary | ICD-10-CM | POA: Diagnosis not present

## 2022-06-16 DIAGNOSIS — D649 Anemia, unspecified: Secondary | ICD-10-CM | POA: Diagnosis not present

## 2022-06-16 DIAGNOSIS — Z20822 Contact with and (suspected) exposure to covid-19: Secondary | ICD-10-CM | POA: Insufficient documentation

## 2022-06-16 DIAGNOSIS — J90 Pleural effusion, not elsewhere classified: Secondary | ICD-10-CM | POA: Diagnosis not present

## 2022-06-16 DIAGNOSIS — E119 Type 2 diabetes mellitus without complications: Secondary | ICD-10-CM | POA: Insufficient documentation

## 2022-06-16 DIAGNOSIS — C8590 Non-Hodgkin lymphoma, unspecified, unspecified site: Secondary | ICD-10-CM | POA: Insufficient documentation

## 2022-06-16 DIAGNOSIS — J441 Chronic obstructive pulmonary disease with (acute) exacerbation: Secondary | ICD-10-CM | POA: Diagnosis not present

## 2022-06-16 DIAGNOSIS — D696 Thrombocytopenia, unspecified: Secondary | ICD-10-CM | POA: Diagnosis not present

## 2022-06-16 DIAGNOSIS — R0600 Dyspnea, unspecified: Secondary | ICD-10-CM | POA: Diagnosis not present

## 2022-06-16 DIAGNOSIS — R0902 Hypoxemia: Secondary | ICD-10-CM | POA: Diagnosis not present

## 2022-06-16 DIAGNOSIS — F17210 Nicotine dependence, cigarettes, uncomplicated: Secondary | ICD-10-CM | POA: Insufficient documentation

## 2022-06-16 DIAGNOSIS — R161 Splenomegaly, not elsewhere classified: Secondary | ICD-10-CM | POA: Insufficient documentation

## 2022-06-16 DIAGNOSIS — R600 Localized edema: Secondary | ICD-10-CM | POA: Insufficient documentation

## 2022-06-16 DIAGNOSIS — Z743 Need for continuous supervision: Secondary | ICD-10-CM | POA: Diagnosis not present

## 2022-06-16 LAB — SARS CORONAVIRUS 2 BY RT PCR: SARS Coronavirus 2 by RT PCR: NEGATIVE

## 2022-06-16 LAB — CBC WITH DIFFERENTIAL/PLATELET
Abs Immature Granulocytes: 0.09 10*3/uL — ABNORMAL HIGH (ref 0.00–0.07)
Basophils Absolute: 0 10*3/uL (ref 0.0–0.1)
Basophils Relative: 1 %
Eosinophils Absolute: 0.1 10*3/uL (ref 0.0–0.5)
Eosinophils Relative: 1 %
HCT: 28.7 % — ABNORMAL LOW (ref 36.0–46.0)
Hemoglobin: 8.6 g/dL — ABNORMAL LOW (ref 12.0–15.0)
Immature Granulocytes: 2 %
Lymphocytes Relative: 76 %
Lymphs Abs: 3.7 10*3/uL (ref 0.7–4.0)
MCH: 28.4 pg (ref 26.0–34.0)
MCHC: 30 g/dL (ref 30.0–36.0)
MCV: 94.7 fL (ref 80.0–100.0)
Monocytes Absolute: 0.6 10*3/uL (ref 0.1–1.0)
Monocytes Relative: 12 %
Neutro Abs: 0.4 10*3/uL — CL (ref 1.7–7.7)
Neutrophils Relative %: 8 %
Platelets: 84 10*3/uL — ABNORMAL LOW (ref 150–400)
RBC: 3.03 MIL/uL — ABNORMAL LOW (ref 3.87–5.11)
RDW: 18.6 % — ABNORMAL HIGH (ref 11.5–15.5)
Smear Review: NORMAL
WBC Morphology: INCREASED
WBC: 4.9 10*3/uL (ref 4.0–10.5)
nRBC: 1.4 % — ABNORMAL HIGH (ref 0.0–0.2)

## 2022-06-16 LAB — COMPREHENSIVE METABOLIC PANEL
ALT: 10 U/L (ref 0–44)
AST: 26 U/L (ref 15–41)
Albumin: 3.4 g/dL — ABNORMAL LOW (ref 3.5–5.0)
Alkaline Phosphatase: 130 U/L — ABNORMAL HIGH (ref 38–126)
Anion gap: 8 (ref 5–15)
BUN: 23 mg/dL (ref 8–23)
CO2: 26 mmol/L (ref 22–32)
Calcium: 9.4 mg/dL (ref 8.9–10.3)
Chloride: 110 mmol/L (ref 98–111)
Creatinine, Ser: 0.95 mg/dL (ref 0.44–1.00)
GFR, Estimated: >60 mL/min (ref >60)
Glucose, Bld: 112 mg/dL — ABNORMAL HIGH (ref 70–99)
Potassium: 3.6 mmol/L (ref 3.5–5.1)
Sodium: 144 mmol/L (ref 135–145)
Total Bilirubin: 3 mg/dL — ABNORMAL HIGH (ref 0.3–1.2)
Total Protein: 5.5 g/dL — ABNORMAL LOW (ref 6.5–8.1)

## 2022-06-16 LAB — TROPONIN I (HIGH SENSITIVITY): Troponin I (High Sensitivity): 8 ng/L (ref <18)

## 2022-06-16 LAB — LACTIC ACID, PLASMA: Lactic Acid, Venous: 1.2 mmol/L (ref 0.5–1.9)

## 2022-06-16 LAB — BRAIN NATRIURETIC PEPTIDE: B Natriuretic Peptide: 170.5 pg/mL — ABNORMAL HIGH (ref 0.0–100.0)

## 2022-06-16 MED ORDER — SODIUM CHLORIDE 0.9 % IV BOLUS
500.0000 mL | Freq: Once | INTRAVENOUS | Status: AC
  Administered 2022-06-17: 500 mL via INTRAVENOUS

## 2022-06-16 MED ORDER — IPRATROPIUM-ALBUTEROL 0.5-2.5 (3) MG/3ML IN SOLN
3.0000 mL | Freq: Once | RESPIRATORY_TRACT | Status: AC
  Administered 2022-06-16: 3 mL via RESPIRATORY_TRACT
  Filled 2022-06-16: qty 3

## 2022-06-16 MED ORDER — IPRATROPIUM-ALBUTEROL 0.5-2.5 (3) MG/3ML IN SOLN
3.0000 mL | Freq: Once | RESPIRATORY_TRACT | Status: AC
  Administered 2022-06-17: 3 mL via RESPIRATORY_TRACT
  Filled 2022-06-16: qty 3

## 2022-06-16 MED ORDER — IOHEXOL 350 MG/ML SOLN
75.0000 mL | Freq: Once | INTRAVENOUS | Status: AC | PRN
  Administered 2022-06-16: 75 mL via INTRAVENOUS

## 2022-06-16 NOTE — ED Provider Notes (Signed)
[facility] Provider Note    Event Date/Time   First MD Initiated Contact with Patient 06/16/22 2137     (approximate)   History   Shortness of Breath   HPI  Natasha [name] is a 76 y.o. female extensive and complex past medical history and oncologic history is a very vague poor historian presents to the ER reportedly for worsening cough and shortness of breath as well as leg swelling.  Patient very vague about how long this has been going on.  On review of past medical records she had recent admission in July for similar symptoms and was actually transferred to [facility] after having evidence of pleural effusion was sent for further evaluation.  On their evaluation there per review of care everywhere patient declined oncologic work-up given her long history of leukemia as well as lymphoma.  She had diagnostic and therapeutic thoracentesis showed an exudative effusion.  She was discharged to palliative care after that.     Physical Exam   Triage Vital Signs: ED Triage Vitals  Enc Vitals Group     BP 06/16/22 2140 (!) 100/40     Pulse Rate 06/16/22 2140 87     Resp 06/16/22 2140 18     Temp 06/16/22 2140 97.9 F (36.6 C)     Temp Source 06/16/22 2140 Oral     SpO2 06/16/22 2133 96 %     Weight 06/16/22 2137 167 lb 5.3 oz (75.9 kg)     Height 06/16/22 2137 '5\' 4"'$  (1.626 m)     Head Circumference --      Peak Flow --      Pain Score 06/16/22 2137 0     Pain Loc --      Pain Edu? --      Excl. in [place]? --     Most recent vital signs: Vitals:   06/16/22 2145 06/16/22 2318  BP: (!) 96/44 (!) 96/45  Pulse: 88 95  Resp: 16 17  Temp:    SpO2: 94% 93%     Constitutional: Alert, chronically ill appearing  Eyes: Conjunctivae are normal.  Head: Atraumatic. Nose: No congestion/rhinnorhea. Mouth/Throat: Mucous membranes are moist.   Neck: Painless ROM.  Cardiovascular:   Good peripheral circulation. Respiratory: mild tachypnea  Diminished right basilar bs.   Scattered expiratory wheeze Gastrointestinal: Soft and nontender.  Musculoskeletal:  no deformity, ble pitting edema Neurologic:  MAE spontaneously. No gross focal neurologic deficits are appreciated.  Skin:  Skin is warm, dry and intact. No rash noted. Psychiatric: Mood and affect are normal. Speech and behavior are normal.    ED Results / Procedures / Treatments   Labs (all labs ordered are listed, but only abnormal results are displayed) Labs Reviewed  CBC WITH DIFFERENTIAL/PLATELET - Abnormal; Notable for the following components:      Result Value   RBC 3.03 (*)    Hemoglobin 8.6 (*)    HCT 28.7 (*)    RDW 18.6 (*)    Platelets 84 (*)    nRBC 1.4 (*)    Neutro Abs 0.4 (*)    Abs Immature Granulocytes 0.09 (*)    All other components within normal limits  COMPREHENSIVE METABOLIC PANEL - Abnormal; Notable for the following components:   Glucose, Bld 112 (*)    Total Protein 5.5 (*)    Albumin 3.4 (*)    Alkaline Phosphatase 130 (*)    Total Bilirubin 3.0 (*)    All other components within  normal limits  BRAIN NATRIURETIC PEPTIDE - Abnormal; Notable for the following components:   B Natriuretic Peptide 170.5 (*)    All other components within normal limits  SARS CORONAVIRUS 2 BY RT PCR  LACTIC ACID, PLASMA  LACTIC ACID, PLASMA  PATHOLOGIST SMEAR REVIEW  BLOOD GAS, VENOUS  TROPONIN I (HIGH SENSITIVITY)  TROPONIN I (HIGH SENSITIVITY)     EKG  ED ECG REPORT I, Merlyn Lot, the attending physician, personally viewed and interpreted this ECG.   Date: 06/16/2022  EKG Time: 21:41  Rate: 85  Rhythm: sinus  Axis: normal  Intervals: normal  ST&T Change: nonspecific st abn, no stemi    RADIOLOGY Please see ED Course for my review and interpretation.  I personally reviewed all radiographic images ordered to evaluate for the above acute complaints and reviewed radiology reports and findings.  These findings were personally discussed with the patient.  Please  see medical record for radiology report.    PROCEDURES:  Critical Care performed: No  .Ortho Injury Treatment  Date/Time: 06/16/2022 9:58 PM  Performed by: Merlyn Lot, MD Authorized by: Merlyn Lot, MD       MEDICATIONS ORDERED IN ED: Medications  sodium chloride 0.9 % bolus 500 mL (has no administration in time range)  ipratropium-albuterol (DUONEB) 0.5-2.5 (3) MG/3ML nebulizer solution 3 mL (has no administration in time range)  ipratropium-albuterol (DUONEB) 0.5-2.5 (3) MG/3ML nebulizer solution 3 mL (3 mLs Nebulization Given 06/16/22 2226)  iohexol (OMNIPAQUE) 350 MG/ML injection 75 mL (75 mLs Intravenous Contrast Given 06/16/22 2339)     IMPRESSION / MDM / ASSESSMENT AND PLAN / ED COURSE  I reviewed the triage vital signs and the nursing notes.                              Differential diagnosis includes, but is not limited to, Asthma, copd, CHF, pna, ptx, malignancy, Pe, anemia  Patient presented to the ER for evaluation of symptoms as described above.  This presenting complaint could reflect a potentially life-threatening illness therefore the patient will be placed on continuous pulse oximetry and telemetry for monitoring.  Laboratory evaluation will be sent to evaluate for the above complaints.  Patient well-perfused does have lower extremity edema exam as described above concerning for possible CHF but given her significant oncologic history will order chest x-ray blood work evaluate for sepsis.  Also has wheezing on exam given Solu-Medrol as well as nebulizer may be underlying COPD.    Clinical Course as of 06/16/22 2350  Wed Jun 16, 2022  2241 Chest x-ray on my review and interpretation concerning for developing right large pleural effusion.  Still awaiting remainder of blood work. [PR]  2332 Protocol is wise stable.  COVID-negative.  Lactate negative.  BNP borderline elevated we will give IV fluid bolus 500 as her blood pressure is a little bit soft but  she is also sleeping.  Will order CTA renal function normal.  Will consult hospitalist for admission given her symptoms with large pleural effusion shortness of breath for therapeutic thoracentesis. [PR]  2346 CTA on my review and interpretation shows large pleural effusion no evidence of PE.   [PR]    Clinical Course User Index [PR] Merlyn Lot, MD     FINAL CLINICAL IMPRESSION(S) / ED DIAGNOSES   Final diagnoses:  Pleural effusion  COPD exacerbation ([place])     Rx / DC Orders   ED Discharge Orders  None        Note:  This document was prepared using Dragon voice recognition software and may include unintentional dictation errors.    Merlyn Lot, MD 06/16/22 2350

## 2022-06-16 NOTE — H&P (Incomplete)
History and Physical    Natasha [name] EVO:[id number] DOB: 1945-12-30 DOA: 06/16/2022  PCP: Jon Billings, NP  Patient coming from: home  I have personally briefly reviewed patient's old medical records in [place]  Chief Complaint: sob/doe  HPI: Natasha [name] is a 76 y.o. female with medical history significant of  Lymphoma, Leukemia for which patient declines further evaluation, pleural effusion presumed related to malignancy for which patient only wants symptomatic treatment patient also has history of tobacco abuse ,COPD,DMII, lower extremity edema who has had interim history of admission 7/2-04/26/22 due to pleural effusion s/p thoracentesis.  Patient states s/p discharge she felt improved but noted soon there after progressive sob an doe. She notes he symptoms have been more persistent over the last few days. She notes fever/chills/ but does not cough, she denies chest pain , n/v/d/dysuria or abdominal pain.  ED Course:  Vitals: afeb, bp 100/40, hr 87, bp 18 stat 95% on ra  EXH:BZJIR rhythm nos specific st change, artifact Labs: Wbc 4.9, hgb 8.6 (9.4),  plt 84, lactic 1.2,  Cer 8,7 NA 144, K 3.6, cL 110, glu 112, cr 0.95, alkphos 130 Tb 3 Cxr: 1. Moderate to large right and small left pleural effusions, enlarged since prior examination. 2. Stable right pulmonary nodule, previously characterized as a pulmonary hamartoma on 04/24/2022.  CTPA 1. No CT evidence of pulmonary artery embolus. 2. Moderate right and small left pleural effusions with compressive atelectasis of the majority of the right lower lobe versus pneumonia. 3. Mediastinal, bilateral hilar, bilateral axillary, and upper abdominal adenopathy consistent with history of lymphoma. 4. Splenomegaly  Review of Systems: As per HPI otherwise 10 point review of systems negative.   Past Medical History:  Diagnosis Date   Cancer ([place])    lymphoma-stomach    Cancer ([place])    leulemia   Diabetes mellitus  without complication ([place])    Vaginal delivery    x 5    Past Surgical History:  Procedure Laterality Date   TONSILLECTOMY Bilateral    as a child     reports that she has been smoking cigarettes. She started smoking about 60 years ago. She has a 55.00 pack-year smoking history. She has never used smokeless tobacco. She reports that she does not drink alcohol and does not use drugs.  No Known Allergies  Family History  Problem Relation Age of Onset   Breast cancer Mother    Alzheimer's disease Father    Lung cancer Father        lung   Varicose Veins Brother    Heart attack Brother     Prior to Admission medications   Medication Sig Start Date End Date Taking? Authorizing Provider  albuterol (VENTOLIN HFA) 108 (90 Base) MCG/ACT inhaler TAKE 2 PUFFS BY MOUTH EVERY 6 HOURS AS NEEDED FOR WHEEZE OR SHORTNESS OF BREATH 06/04/22   Cannady, Henrine Screws T, NP  cyclobenzaprine (FLEXERIL) 10 MG tablet Take 10 mg by mouth at bedtime. 05/07/22   [provider]  Ipratropium-Albuterol (COMBIVENT RESPIMAT) 20-100 MCG/ACT AERS respimat Inhale 1 puff into the lungs every 6 (six) hours.    [provider]  umeclidinium-vilanterol Jearl Klinefelter ELLIPTA) 62.5-25 MCG/ACT AEPB Inhale 1 puff into the lungs daily at 6 (six) AM. Patient not taking: Reported on 05/18/2022 04/09/22   Venita Lick, NP    Physical Exam: Vitals:   06/16/22 2137 06/16/22 2140 06/16/22 2145 06/16/22 2318  BP:  (!) 100/40 (!) 96/44 (!) 96/45  Pulse:  87 88 95  Resp:  '18 16 17  '$ Temp:  97.9 F (36.6 C)    TempSrc:  Oral    SpO2:  95% 94% 93%  Weight: 75.9 kg     Height: '5\' 4"'$  (1.626 m)       Vitals:   06/16/22 2137 06/16/22 2140 06/16/22 2145 06/16/22 2318  BP:  (!) 100/40 (!) 96/44 (!) 96/45  Pulse:  87 88 95  Resp:  '18 16 17  '$ Temp:  97.9 F (36.6 C)    TempSrc:  Oral    SpO2:  95% 94% 93%  Weight: 75.9 kg     Height: '5\' 4"'$  (1.626 m)      Constitutional: NAD, calm, comfortable Eyes: PERRL, lids and  conjunctivae normal ENMT: Mucous membranes are moist. Posterior pharynx clear of any exudate or lesions.Normal dentition.  Neck: normal, supple, no masses, no thyromegaly Respiratory: + ronchi, + crackles . Decrease air entry on right vs left, Normal respiratory effort. Minimal accessory muscle use.  Cardiovascular: Regular rate and rhythm, no murmurs / rubs / gallops. +extremity edema. + pedal pulses.  Abdomen: diffuse tenderness right>L, no masses palpated. No hepatosplenomegaly. Bowel sounds positive.  Musculoskeletal: no clubbing / cyanosis. No joint deformity upper and lower extremities. Good ROM, no contractures. Normal muscle tone.  Skin: no rashes, lesions, ulcers. No induration Neurologic: CN 2-12 grossly intact. Sensation intact,  Strength 5/5 in all 4.  Psychiatric:poor insight. Alert and oriented x 3. Normal mood.    Labs on Admission: I have personally reviewed following labs and imaging studies  CBC: Recent Labs  Lab 06/16/22 2144  WBC 4.9  NEUTROABS 0.4*  HGB 8.6*  HCT 28.7*  MCV 94.7  PLT 84*   Basic Metabolic Panel: Recent Labs  Lab 06/16/22 2144  NA 144  K 3.6  CL 110  CO2 26  GLUCOSE 112*  BUN 23  CREATININE 0.95  CALCIUM 9.4   GFR: Estimated Creatinine Clearance: 51.8 mL/min (by C-G formula based on SCr of 0.95 mg/dL). Liver Function Tests: Recent Labs  Lab 06/16/22 2144  AST 26  ALT 10  ALKPHOS 130*  BILITOT 3.0*  PROT 5.5*  ALBUMIN 3.4*   No results for input(s): "LIPASE", "AMYLASE" in the last 168 hours. No results for input(s): "AMMONIA" in the last 168 hours. Coagulation Profile: No results for input(s): "INR", "PROTIME" in the last 168 hours. Cardiac Enzymes: No results for input(s): "CKTOTAL", "CKMB", "CKMBINDEX", "TROPONINI" in the last 168 hours. BNP (last 3 results) No results for input(s): "PROBNP" in the last 8760 hours. HbA1C: No results for input(s): "HGBA1C" in the last 72 hours. CBG: No results for input(s): "GLUCAP" in  the last 168 hours. Lipid Profile: No results for input(s): "CHOL", "HDL", "LDLCALC", "TRIG", "CHOLHDL", "LDLDIRECT" in the last 72 hours. Thyroid Function Tests: No results for input(s): "TSH", "T4TOTAL", "FREET4", "T3FREE", "THYROIDAB" in the last 72 hours. Anemia Panel: No results for input(s): "VITAMINB12", "FOLATE", "FERRITIN", "TIBC", "IRON", "RETICCTPCT" in the last 72 hours. Urine analysis:    Component Value Date/Time   COLORURINE AMBER (A) 06/11/2018 1308   APPEARANCEUR CLOUDY (A) 06/11/2018 1308   LABSPEC 1.022 06/11/2018 1308   PHURINE 5.0 06/11/2018 1308   GLUCOSEU >=500 (A) 06/11/2018 1308   HGBUR LARGE (A) 06/11/2018 1308   BILIRUBINUR NEGATIVE 06/11/2018 1308   KETONESUR NEGATIVE 06/11/2018 1308   PROTEINUR 30 (A) 06/11/2018 1308   NITRITE NEGATIVE 06/11/2018 1308   LEUKOCYTESUR LARGE (A) 06/11/2018 1308    Radiological Exams on Admission:  DG Chest Portable 1 View  Result Date: 06/16/2022 CLINICAL DATA:  Dyspnea EXAM: PORTABLE CHEST 1 VIEW COMPARISON:  04/24/2022 FINDINGS: Moderate to large right and small left pleural effusions are seen, enlarged since prior examination on the right and new on the left. Stable 19 mm nodule within the right mid lung zone. No pneumothorax. Cardiac size is within normal limits. Pulmonary vascularity is normal. No acute bone abnormality. IMPRESSION: 1. Moderate to large right and small left pleural effusions, enlarged since prior examination. 2. Stable right pulmonary nodule, previously characterized as a pulmonary hamartoma on 04/24/2022. Electronically Signed   By: Fidela Salisbury M.D.   On: 06/16/2022 22:24    EKG: Independently reviewed. 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  -monitor plt counts   Lower extremity edema -on trial with po lasix , per patient note effective would consider trial of iv in house as continue as bp allows -bnp 170  Elevated bili -monitor labs, lfts  -related to malignant  process DVT prophylaxis: scd   Code Status: full Family Communication: none at bedside  Disposition Plan: patient  expected to be admitted greater than 2 midnights  Consults called: ir Admission status: progressive care    Clance Boll MD [facility]   If 7PM-7AM, please contact night-coverage [email or web address] Password [facility]  06/16/2022, 11:47 PM

## 2022-06-16 NOTE — ED Triage Notes (Signed)
Pt presents to ED via EMS due to worsen SOB. Hx COPD. Current smoker 1/2 pack a day. Pt does not wear O2 at home. Denies N/V/D.

## 2022-06-17 ENCOUNTER — Inpatient Hospital Stay: Payer: Medicare Other

## 2022-06-17 DIAGNOSIS — R609 Edema, unspecified: Secondary | ICD-10-CM | POA: Diagnosis not present

## 2022-06-17 DIAGNOSIS — J90 Pleural effusion, not elsewhere classified: Secondary | ICD-10-CM

## 2022-06-17 DIAGNOSIS — J441 Chronic obstructive pulmonary disease with (acute) exacerbation: Secondary | ICD-10-CM | POA: Diagnosis not present

## 2022-06-17 DIAGNOSIS — Z8572 Personal history of non-Hodgkin lymphomas: Secondary | ICD-10-CM | POA: Diagnosis not present

## 2022-06-17 DIAGNOSIS — R059 Cough, unspecified: Secondary | ICD-10-CM | POA: Diagnosis not present

## 2022-06-17 DIAGNOSIS — R911 Solitary pulmonary nodule: Secondary | ICD-10-CM | POA: Diagnosis not present

## 2022-06-17 LAB — IRON AND TIBC
Iron: 52 ug/dL (ref 28–170)
Saturation Ratios: 16 % (ref 10.4–31.8)
TIBC: 336 ug/dL (ref 250–450)
UIBC: 284 ug/dL

## 2022-06-17 LAB — CBC
HCT: 26.7 % — ABNORMAL LOW (ref 36.0–46.0)
Hemoglobin: 7.7 g/dL — ABNORMAL LOW (ref 12.0–15.0)
MCH: 27.6 pg (ref 26.0–34.0)
MCHC: 28.8 g/dL — ABNORMAL LOW (ref 30.0–36.0)
MCV: 95.7 fL (ref 80.0–100.0)
Platelets: 88 10*3/uL — ABNORMAL LOW (ref 150–400)
RBC: 2.79 MIL/uL — ABNORMAL LOW (ref 3.87–5.11)
RDW: 18.6 % — ABNORMAL HIGH (ref 11.5–15.5)
WBC: 3.1 10*3/uL — ABNORMAL LOW (ref 4.0–10.5)
nRBC: 1.3 % — ABNORMAL HIGH (ref 0.0–0.2)

## 2022-06-17 LAB — BLOOD GAS, VENOUS
Acid-base deficit: 0.2 mmol/L (ref 0.0–2.0)
Bicarbonate: 25.9 mmol/L (ref 20.0–28.0)
O2 Saturation: 58.4 %
Patient temperature: 37
pCO2, Ven: 47 mmHg (ref 44–60)
pH, Ven: 7.35 (ref 7.25–7.43)
pO2, Ven: 39 mmHg (ref 32–45)

## 2022-06-17 LAB — BODY FLUID CELL COUNT WITH DIFFERENTIAL
Eos, Fluid: 0 %
Lymphs, Fluid: 85 %
Monocyte-Macrophage-Serous Fluid: 14 %
Neutrophil Count, Fluid: 1 %
Total Nucleated Cell Count, Fluid: 3748 cu mm

## 2022-06-17 LAB — COMPREHENSIVE METABOLIC PANEL
ALT: 11 U/L (ref 0–44)
AST: 24 U/L (ref 15–41)
Albumin: 3.2 g/dL — ABNORMAL LOW (ref 3.5–5.0)
Alkaline Phosphatase: 112 U/L (ref 38–126)
Anion gap: 8 (ref 5–15)
BUN: 29 mg/dL — ABNORMAL HIGH (ref 8–23)
CO2: 24 mmol/L (ref 22–32)
Calcium: 8.9 mg/dL (ref 8.9–10.3)
Chloride: 110 mmol/L (ref 98–111)
Creatinine, Ser: 1.06 mg/dL — ABNORMAL HIGH (ref 0.44–1.00)
GFR, Estimated: 55 mL/min — ABNORMAL LOW (ref >60)
Glucose, Bld: 319 mg/dL — ABNORMAL HIGH (ref 70–99)
Potassium: 4.3 mmol/L (ref 3.5–5.1)
Sodium: 142 mmol/L (ref 135–145)
Total Bilirubin: 2.1 mg/dL — ABNORMAL HIGH (ref 0.3–1.2)
Total Protein: 5.1 g/dL — ABNORMAL LOW (ref 6.5–8.1)

## 2022-06-17 LAB — LACTATE DEHYDROGENASE: LDH: 279 U/L — ABNORMAL HIGH (ref 98–192)

## 2022-06-17 LAB — VITAMIN B12: Vitamin B-12: 904 pg/mL (ref 180–914)

## 2022-06-17 LAB — CBG MONITORING, ED
Glucose-Capillary: 298 mg/dL — ABNORMAL HIGH (ref 70–99)
Glucose-Capillary: 297 mg/dL — ABNORMAL HIGH (ref 70–99)

## 2022-06-17 LAB — FERRITIN: Ferritin: 63 ng/mL (ref 11–307)

## 2022-06-17 LAB — EXPECTORATED SPUTUM ASSESSMENT W GRAM STAIN, RFLX TO RESP C

## 2022-06-17 LAB — TROPONIN I (HIGH SENSITIVITY): Troponin I (High Sensitivity): 7 ng/L (ref <18)

## 2022-06-17 LAB — TSH: TSH: 3.593 u[IU]/mL (ref 0.350–4.500)

## 2022-06-17 LAB — RETICULOCYTES
Immature Retic Fract: 34.4 % — ABNORMAL HIGH (ref 2.3–15.9)
RBC.: 2.92 MIL/uL — ABNORMAL LOW (ref 3.87–5.11)
Retic Count, Absolute: 121.5 10*3/uL (ref 19.0–186.0)
Retic Ct Pct: 4.2 % — ABNORMAL HIGH (ref 0.4–3.1)

## 2022-06-17 LAB — PATHOLOGIST SMEAR REVIEW

## 2022-06-17 LAB — FOLATE: Folate: 9.1 ng/mL (ref >5.9)

## 2022-06-17 LAB — LACTIC ACID, PLASMA: Lactic Acid, Venous: 1.1 mmol/L (ref 0.5–1.9)

## 2022-06-17 MED ORDER — HYDROCOD POLI-CHLORPHE POLI ER 10-8 MG/5ML PO SUER: 5.0000 mL | Freq: Two times a day (BID) | ORAL | 0 refills | Status: AC

## 2022-06-17 MED ORDER — SODIUM CHLORIDE 0.9 % IV BOLUS
1000.0000 mL | Freq: Once | INTRAVENOUS | Status: AC
  Administered 2022-06-17: 1000 mL via INTRAVENOUS

## 2022-06-17 MED ORDER — ONDANSETRON HCL 4 MG/2ML IJ SOLN: 4.0000 mg | Freq: Four times a day (QID) | INTRAMUSCULAR | Status: DC | PRN

## 2022-06-17 MED ORDER — IPRATROPIUM-ALBUTEROL 0.5-2.5 (3) MG/3ML IN SOLN
3.0000 mL | Freq: Four times a day (QID) | RESPIRATORY_TRACT | Status: DC
  Administered 2022-06-17 (×3): 3 mL via RESPIRATORY_TRACT
  Filled 2022-06-17 (×2): qty 3

## 2022-06-17 MED ORDER — ALBUTEROL SULFATE HFA 108 (90 BASE) MCG/ACT IN AERS: INHALATION_SPRAY | RESPIRATORY_TRACT | 1 refills | Status: DC

## 2022-06-17 MED ORDER — ALBUTEROL SULFATE (2.5 MG/3ML) 0.083% IN NEBU: 2.5000 mg | INHALATION_SOLUTION | Freq: Four times a day (QID) | RESPIRATORY_TRACT | Status: DC | PRN

## 2022-06-17 MED ORDER — INSULIN ASPART 100 UNIT/ML IJ SOLN
0.0000 [IU] | Freq: Three times a day (TID) | INTRAMUSCULAR | Status: DC
  Administered 2022-06-17 (×2): 3 [IU] via SUBCUTANEOUS
  Filled 2022-06-17 (×2): qty 1

## 2022-06-17 MED ORDER — ACETAMINOPHEN 325 MG RE SUPP: 650.0000 mg | Freq: Four times a day (QID) | RECTAL | Status: DC | PRN

## 2022-06-17 MED ORDER — BENZONATATE 200 MG PO CAPS: 200.0000 mg | ORAL_CAPSULE | Freq: Three times a day (TID) | ORAL | 0 refills | Status: DC

## 2022-06-17 MED ORDER — IPRATROPIUM-ALBUTEROL 20-100 MCG/ACT IN AERS: 1.0000 | INHALATION_SPRAY | Freq: Four times a day (QID) | RESPIRATORY_TRACT | Status: DC

## 2022-06-17 MED ORDER — ALBUTEROL SULFATE HFA 108 (90 BASE) MCG/ACT IN AERS: 2.0000 | INHALATION_SPRAY | Freq: Four times a day (QID) | RESPIRATORY_TRACT | Status: DC | PRN

## 2022-06-17 MED ORDER — FUROSEMIDE 20 MG PO TABS: 20.0000 mg | ORAL_TABLET | Freq: Every day | ORAL | 0 refills | Status: DC

## 2022-06-17 MED ORDER — BENZONATATE 100 MG PO CAPS: 200.0000 mg | ORAL_CAPSULE | Freq: Three times a day (TID) | ORAL | Status: DC

## 2022-06-17 MED ORDER — HYDROCOD POLI-CHLORPHE POLI ER 10-8 MG/5ML PO SUER
5.0000 mL | Freq: Two times a day (BID) | ORAL | Status: DC
  Administered 2022-06-17: 5 mL via ORAL
  Filled 2022-06-17: qty 5

## 2022-06-17 MED ORDER — ONDANSETRON HCL 4 MG PO TABS: 4.0000 mg | ORAL_TABLET | Freq: Four times a day (QID) | ORAL | Status: DC | PRN

## 2022-06-17 MED ORDER — BENZONATATE 100 MG PO CAPS
200.0000 mg | ORAL_CAPSULE | Freq: Three times a day (TID) | ORAL | Status: DC
  Administered 2022-06-17: 200 mg via ORAL
  Filled 2022-06-17: qty 2

## 2022-06-17 MED ORDER — ACETAMINOPHEN 325 MG PO TABS: 650.0000 mg | ORAL_TABLET | Freq: Four times a day (QID) | ORAL | Status: DC | PRN

## 2022-06-17 MED ORDER — CYCLOBENZAPRINE HCL 10 MG PO TABS: 10.0000 mg | ORAL_TABLET | Freq: Every day | ORAL | 0 refills | Status: DC

## 2022-06-17 NOTE — Care Management Obs Status (Signed)
[place] NOTIFICATION   Patient Details  Name: [name] MRN: [id number] Date of Birth: 1946-02-16   Medicare Observation Status Notification Given:  Yes    Shelbie Hutching, RN 06/17/2022, 2:57 PM

## 2022-06-17 NOTE — Discharge Summary (Signed)
Physician Discharge Summary  Natasha [name] LOV:[id number] DOB: May 19, 1946 DOA: 06/16/2022  PCP: Jon Billings, NP  Admit date: 06/16/2022 Discharge date: 06/17/2022  Admitted From: Home Disposition:  Home  Recommendations for Outpatient Follow-up:  Follow up with PCP in 1-2 weeks Follow up outpatient palliative care  Home Health:No  Equipment/Devices:None   Discharge Condition:Stable  CODE STATUS:FULL  Diet recommendation: Reg  Brief/Interim Summary:  76 y.o. female with medical history significant of  Lymphoma, Leukemia for which patient declines further evaluation, pleural effusion presumed related to malignancy for which patient only wants symptomatic treatment patient also has history of tobacco abuse ,COPD,DMII, lower extremity edema who has had interim history of admission 7/2-04/26/22 due to pleural effusion s/p thoracentesis.  Patient states s/p discharge she felt improved but noted soon there after progressive sob an doe. She notes he symptoms have been more persistent over the last few days. She notes fever/chills/ but does not cough, she denies chest pain , n/v/d/dysuria or abdominal pain.  Underwent successful thoracentesis on 8/24.  Yielded 850 cc of amber-colored fluid.  Patient tolerated procedure well.  No immediate complications.  Post procedure chest x-ray shows interval decrease in size of pleural effusion with no evidence of pneumothorax.  Patient weaned off oxygen and is on room air at time of discharge.  Stable for DC home.  Patient does not have any antitussives at home.  Will prescribe short course of Tessalon and Tussionex.  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  Patient was worked up at [facility] however declined any specific oncology care.  Desires only symptomatic care.  Patient is  followed outpatient by palliative care.  Completed a ultrasound-guided thoracentesis while admitted to [facility].  850 cc amber-colored fluid liberated.  Weaned off oxygen postprocedure.  Postprocedure x-ray shows interval decrease in size of pleural effusion, negative for pneumothorax.  Stable for DC home at this time.  Recommend outpatient follow-up with PCP and palliative care.  Discharge Instructions  Discharge Instructions     Diet - low sodium heart healthy   Complete by: As directed    Increase activity slowly   Complete by: As directed       Allergies as of 06/17/2022   No Known Allergies      Medication List     STOP taking these medications    potassium chloride 10 MEQ CR capsule Commonly known as: MICRO-K   umeclidinium-vilanterol 62.5-25 MCG/ACT Aepb Commonly known as: ANORO ELLIPTA       TAKE these medications    albuterol 108 (90 Base) MCG/ACT inhaler Commonly known as: VENTOLIN HFA TAKE 2 PUFFS BY MOUTH EVERY 6 HOURS AS NEEDED FOR WHEEZE OR SHORTNESS OF BREATH   benzonatate 200 MG capsule Commonly known as: TESSALON Take 1 capsule (200 mg total) by mouth 3 (three) times daily.   chlorpheniramine-HYDROcodone 10-8 MG/5ML Commonly known as: TUSSIONEX Take 5 mLs by mouth every 12 (twelve) hours for 12 days.   Combivent Respimat 20-100 MCG/ACT Aers respimat Generic drug: Ipratropium-Albuterol Inhale 1 puff into the lungs every 6 (six) hours.   cyclobenzaprine 10 MG tablet Commonly known as: FLEXERIL Take 1 tablet (10 mg total) by mouth at bedtime.   furosemide 20 MG tablet Commonly known as: LASIX Take 1 tablet (20 mg total) by mouth daily.        No Known Allergies  Consultations: Interventional  radiology   Procedures/Studies: US THORACENTESIS ASP PLEURAL SPACE W/IMG GUIDE  Result Date: 06/17/2022 INDICATION: Patient with history of lymphoma and shortness of breath with right-sided pleural effusion request received for diagnostic and  therapeutic thoracentesis. EXAM: ULTRASOUND GUIDED RIGHT THORACENTESIS MEDICATIONS: Local 1% lidocaine only. COMPLICATIONS: None immediate. PROCEDURE: An ultrasound guided thoracentesis was thoroughly discussed with the patient and questions answered. The benefits, risks, alternatives and complications were also discussed. The patient understands and wishes to proceed with the procedure. Written consent was obtained. Ultrasound was performed to localize and mark an adequate pocket of fluid in the right chest. The area was then prepped and draped in the normal sterile fashion. 1% Lidocaine was used for local anesthesia. Under ultrasound guidance a 19 gauge, 7-cm, Yueh catheter was introduced. Thoracentesis was performed. The catheter was removed and a dressing applied. FINDINGS: A total of approximately 850 mL of amber colored fluid was removed. Samples were sent to the laboratory as requested by the clinical team. IMPRESSION: Successful ultrasound guided right thoracentesis yielding 850 mL of pleural fluid. This exam was performed by Tsosie Billing PA-C, and was supervised and interpreted by Dr. Denna Haggard. Electronically Signed   By: Albin Felling M.D.   On: 06/17/2022 13:15   DG Chest Port 1 View  Result Date: 06/17/2022 CLINICAL DATA:  Status post thoracentesis on the right. EXAM: PORTABLE CHEST 1 VIEW COMPARISON:  Chest radiograph dated 06/16/2022. FINDINGS: The heart size and mediastinal contours are within normal limits. There is a small right pleural effusion with associated atelectasis/airspace disease, decreased since prior exam. A previously described pulmonary nodule in the right upper lobe is obscured and not well seen on today's exam. There is a mild left basilar atelectasis and a small left pleural effusion likely contributes. Mild diffuse bilateral interstitial opacities likely represent pulmonary edema. There is no pneumothorax. Degenerative changes are seen in the spine. IMPRESSION: Interval  decrease of a right pleural effusion which is now small in size. There is likely a trace left pleural effusion. Electronically Signed   By: Zerita Boers M.D.   On: 06/17/2022 12:28   CT Angio Chest PE W and/or Wo Contrast  Result Date: 06/16/2022 CLINICAL DATA:  Concern for pulmonary embolism. History of lymphoma. EXAM: CT ANGIOGRAPHY CHEST WITH CONTRAST TECHNIQUE: Multidetector CT imaging of the chest was performed using the standard protocol during bolus administration of intravenous contrast. Multiplanar CT image reconstructions and MIPs were obtained to evaluate the vascular anatomy. RADIATION DOSE REDUCTION: This exam was performed according to the departmental dose-optimization program which includes automated exposure control, adjustment of the mA and/or kV according to patient size and/or use of iterative reconstruction technique. CONTRAST:  41m OMNIPAQUE IOHEXOL 350 MG/ML SOLN COMPARISON:  Chest radiograph dated 06/16/2022 and CT dated 04/24/2022. FINDINGS: Evaluation of this exam is limited due to respiratory motion artifact. Cardiovascular: There is no cardiomegaly or pericardial effusion. The thoracic aorta is unremarkable. The origins of the great vessels of the aortic arch appear patent. Evaluation of the pulmonary arteries is limited due to respiratory motion. No pulmonary artery embolus identified. Mediastinum/Nodes: Bilateral hilar and mediastinal adenopathy. Right hilar adenopathy measures 2.3 cm short axis. Prevascular adenopathy measures 18 mm short axis. Subcarinal adenopathy measures 18 mm short axis. Scattered upper mediastinal, supraclavicular as well as bilateral axillary adenopathy noted. Left axillary adenopathy measures 16 mm in short axis. Right axillary adenopathy measures 18 mm short axis. Lungs/Pleura: Moderate right and small left pleural effusions similar to prior CT. There is compressive atelectasis of the  majority of the right lower lobe versus pneumonia. A 1.8 cm nodular  density with areas of calcification in the right upper lobe along the pleural surface as seen on the prior CT. There is no pneumothorax. The central airways are patent. Upper Abdomen: Splenomegaly.  Upper abdominal adenopathy. Musculoskeletal: Diffuse subcutaneous edema and anasarca. No acute osseous pathology. Review of the MIP images confirms the above findings. IMPRESSION: 1. No CT evidence of pulmonary artery embolus. 2. Moderate right and small left pleural effusions with compressive atelectasis of the majority of the right lower lobe versus pneumonia. 3. Mediastinal, bilateral hilar, bilateral axillary, and upper abdominal adenopathy consistent with history of lymphoma. 4. Splenomegaly. Electronically Signed   By: Anner Crete M.D.   On: 06/16/2022 23:58   DG Chest Portable 1 View  Result Date: 06/16/2022 CLINICAL DATA:  Dyspnea EXAM: PORTABLE CHEST 1 VIEW COMPARISON:  04/24/2022 FINDINGS: Moderate to large right and small left pleural effusions are seen, enlarged since prior examination on the right and new on the left. Stable 19 mm nodule within the right mid lung zone. No pneumothorax. Cardiac size is within normal limits. Pulmonary vascularity is normal. No acute bone abnormality. IMPRESSION: 1. Moderate to large right and small left pleural effusions, enlarged since prior examination. 2. Stable right pulmonary nodule, previously characterized as a pulmonary hamartoma on 04/24/2022. Electronically Signed   By: Fidela Salisbury M.D.   On: 06/16/2022 22:24      Subjective: Seen and examined on the day of discharge.  Respiratory status improved.  Stable for discharge home  Discharge Exam: Vitals:   06/17/22 1234 06/17/22 1300  BP:  (!) 87/48  Pulse:  85  Resp:  15  Temp: 97.6 F (36.4 C)   SpO2: 98% 94%   Vitals:   06/17/22 1139 06/17/22 1202 06/17/22 1234 06/17/22 1300  BP: (!) 99/52 (!) 86/57  (!) 87/48  Pulse: 86 84  85  Resp:    15  Temp:   97.6 F (36.4 C)   TempSrc:   Oral    SpO2: 100% 98% 98% 94%  Weight:      Height:        General: Pt is alert, awake, not in acute distress Cardiovascular: RRR, S1/S2 +, no rubs, no gallops Respiratory: CTA bilaterally, no wheezing, no rhonchi Abdominal: Soft, NT, ND, bowel sounds + Extremities: no edema, no cyanosis    The results of significant diagnostics from this hospitalization (including imaging, microbiology, ancillary and laboratory) are listed below for reference.     Microbiology: Recent Results (from the past 240 hour(s))  SARS Coronavirus 2 by RT PCR (hospital order, performed in [facility] hospital lab) *cepheid single result test* Anterior Nasal Swab     Status: None   Collection Time: 06/16/22 10:21 PM   Specimen: Anterior Nasal Swab  Result Value Ref Range Status   SARS Coronavirus 2 by RT PCR NEGATIVE NEGATIVE Final    Comment: (NOTE) SARS-CoV-2 target nucleic acids are NOT DETECTED.  The SARS-CoV-2 RNA is generally detectable in upper and lower respiratory specimens during the acute phase of infection. The lowest concentration of SARS-CoV-2 viral copies this assay can detect is 250 copies / mL. A negative result does not preclude SARS-CoV-2 infection and should not be used as the sole basis for treatment or other patient management decisions.  A negative result may occur with improper specimen collection / handling, submission of specimen other than nasopharyngeal swab, presence of viral mutation(s) within the areas targeted by this  assay, and inadequate number of viral copies (<250 copies / mL). A negative result must be combined with clinical observations, patient history, and epidemiological information.  Fact Sheet for Patients:   https://www.patel.info/  Fact Sheet for Healthcare Providers: https://hall.com/  This test is not yet approved or  cleared by the [place] FDA and has been authorized for detection and/or diagnosis of  SARS-CoV-2 by FDA under an Emergency Use Authorization (EUA).  This EUA will remain in effect (meaning this test can be used) for the duration of the COVID-19 declaration under Section 564(b)(1) of the Act, 21 U.S.C. section 360bbb-3(b)(1), unless the authorization is terminated or revoked sooner.  Performed at [facility], [address] [place], [place] [address]   Expectorated Sputum Assessment w Gram Stain, Rflx to Resp Cult     Status: None   Collection Time: 06/17/22 12:45 PM   Specimen: Sputum  Result Value Ref Range Status   Specimen Description SPUTUM  Final   Special Requests NONE  Final   Sputum evaluation   Final    THIS SPECIMEN IS ACCEPTABLE FOR SPUTUM CULTURE Performed at [facility], [address] [place], [place] [address]    Report Status 06/17/2022 FINAL  Final     Labs: BNP (last 3 results) Recent Labs    04/09/22 1057 04/24/22 0024 06/16/22 2144  BNP 51.8 332.8* 235.5*   Basic Metabolic Panel: Recent Labs  Lab 06/16/22 2144 06/17/22 0934  NA 144 142  K 3.6 4.3  CL 110 110  CO2 26 24  GLUCOSE 112* 319*  BUN 23 29*  CREATININE 0.95 1.06*  CALCIUM 9.4 8.9   Liver Function Tests: Recent Labs  Lab 06/16/22 2144 06/17/22 0934  AST 26 24  ALT 10 11  ALKPHOS 130* 112  BILITOT 3.0* 2.1*  PROT 5.5* 5.1*  ALBUMIN 3.4* 3.2*   No results for input(s): "LIPASE", "AMYLASE" in the last 168 hours. No results for input(s): "AMMONIA" in the last 168 hours. CBC: Recent Labs  Lab 06/16/22 2144 06/17/22 0934  WBC 4.9 3.1*  NEUTROABS 0.4*  --   HGB 8.6* 7.7*  HCT 28.7* 26.7*  MCV 94.7 95.7  PLT 84* 88*   Cardiac Enzymes: No results for input(s): "CKTOTAL", "CKMB", "CKMBINDEX", "TROPONINI" in the last 168 hours. BNP: Invalid input(s): "POCBNP" CBG: Recent Labs  Lab 06/17/22 0940 06/17/22 1230  GLUCAP 298* 297*   D-Dimer No results for input(s): "DDIMER" in the last 72 hours. Hgb A1c No results for input(s):  "HGBA1C" in the last 72 hours. Lipid Profile No results for input(s): "CHOL", "HDL", "LDLCALC", "TRIG", "CHOLHDL", "LDLDIRECT" in the last 72 hours. Thyroid function studies Recent Labs    06/17/22 0216  TSH 3.593   Anemia work up Recent Labs    06/17/22 0216  VITAMINB12 904  FOLATE 9.1  FERRITIN 63  TIBC 336  IRON 52  RETICCTPCT 4.2*   Urinalysis    Component Value Date/Time   COLORURINE AMBER (A) 06/11/2018 1308   APPEARANCEUR CLOUDY (A) 06/11/2018 1308   LABSPEC 1.022 06/11/2018 1308   PHURINE 5.0 06/11/2018 1308   GLUCOSEU >=500 (A) 06/11/2018 1308   HGBUR LARGE (A) 06/11/2018 1308   BILIRUBINUR NEGATIVE 06/11/2018 1308   KETONESUR NEGATIVE 06/11/2018 1308   PROTEINUR 30 (A) 06/11/2018 1308   NITRITE NEGATIVE 06/11/2018 1308   LEUKOCYTESUR LARGE (A) 06/11/2018 1308   Sepsis Labs Recent Labs  Lab 06/16/22 2144 06/17/22 0934  WBC 4.9 3.1*   Microbiology Recent Results (from  the past 240 hour(s))  SARS Coronavirus 2 by RT PCR (hospital order, performed in [facility] hospital lab) *cepheid single result test* Anterior Nasal Swab     Status: None   Collection Time: 06/16/22 10:21 PM   Specimen: Anterior Nasal Swab  Result Value Ref Range Status   SARS Coronavirus 2 by RT PCR NEGATIVE NEGATIVE Final    Comment: (NOTE) SARS-CoV-2 target nucleic acids are NOT DETECTED.  The SARS-CoV-2 RNA is generally detectable in upper and lower respiratory specimens during the acute phase of infection. The lowest concentration of SARS-CoV-2 viral copies this assay can detect is 250 copies / mL. A negative result does not preclude SARS-CoV-2 infection and should not be used as the sole basis for treatment or other patient management decisions.  A negative result may occur with improper specimen collection / handling, submission of specimen other than nasopharyngeal swab, presence of viral mutation(s) within the areas targeted by this assay, and inadequate number of viral  copies (<250 copies / mL). A negative result must be combined with clinical observations, patient history, and epidemiological information.  Fact Sheet for Patients:   https://www.patel.info/  Fact Sheet for Healthcare Providers: https://hall.com/  This test is not yet approved or  cleared by the [place] FDA and has been authorized for detection and/or diagnosis of SARS-CoV-2 by FDA under an Emergency Use Authorization (EUA).  This EUA will remain in effect (meaning this test can be used) for the duration of the COVID-19 declaration under Section 564(b)(1) of the Act, 21 U.S.C. section 360bbb-3(b)(1), unless the authorization is terminated or revoked sooner.  Performed at [facility], [address] [place], [place] [address]   Expectorated Sputum Assessment w Gram Stain, Rflx to Resp Cult     Status: None   Collection Time: 06/17/22 12:45 PM   Specimen: Sputum  Result Value Ref Range Status   Specimen Description SPUTUM  Final   Special Requests NONE  Final   Sputum evaluation   Final    THIS SPECIMEN IS ACCEPTABLE FOR SPUTUM CULTURE Performed at [facility], [address], [place], [place] [address]    Report Status 06/17/2022 FINAL  Final     Time coordinating discharge: Over 30 minutes  SIGNED:   Sidney Ace, MD  [facility] 06/17/2022, 2:12 PM Pager   If 7PM-7AM, please contact night-coverage

## 2022-06-17 NOTE — ED Notes (Signed)
Pt. Discharged at [phone]

## 2022-06-17 NOTE — Care Management CC44 (Signed)
Condition Code 44 Documentation Completed  Patient Details  Name: Natasha [name] MRN: [id number] Date of Birth: 1946-04-18   Condition Code 44 given:  Yes Patient signature on Condition Code 44 notice:  Yes Documentation of 2 MD's agreement:  Yes Code 44 added to claim:  Yes    Shelbie Hutching, RN 06/17/2022, 2:57 PM

## 2022-06-17 NOTE — ED Notes (Signed)
Pt ambulated to toilet in room without assistance.  Pt reconnected to cardiac monitor and placed back on O2 at this time.  Bed sheets changed d/t being wet from urine.  Pt alert and in NAD at this time.

## 2022-06-17 NOTE — Procedures (Signed)
PROCEDURE SUMMARY:  Successful US guided right thoracentesis. Yielded 850 mL of amber colored fluid. Pt tolerated procedure well. No immediate complications.  Specimen was sent for labs. CXR ordered.  EBL < 1 mL  Rockney Ghee 06/17/2022 12:09 PM

## 2022-06-17 NOTE — ED Notes (Signed)
Pt. Sitting up in bed, eating lunch, NAD.

## 2022-06-18 LAB — HEMOGLOBIN A1C
Hgb A1c MFr Bld: 5.5 % (ref 4.8–5.6)
Mean Plasma Glucose: 111 mg/dL

## 2022-06-20 LAB — CULTURE, RESPIRATORY W GRAM STAIN: Culture: NORMAL

## 2022-06-21 LAB — COMP PANEL: LEUKEMIA/LYMPHOMA: Immunophenotypic Profile: 63

## 2022-06-21 LAB — CYTOLOGY - NON PAP

## 2022-06-21 NOTE — Progress Notes (Unsigned)
There were no vitals taken for this visit.   Subjective:    Patient ID: Natasha [name], female    DOB: 12/12/1945, 76 y.o.   MRN: [id number]  HPI: Natasha [name] is a 76 y.o. female  No chief complaint on file.  LOWER EXTREMITY SWELLING Not improved with Lasix. Plans to get compression stockings and boost once she gets paid.    Patient states she is having an ongoing cough since before her hospitalization.  She is coughing up grey phlegm.  States this is not from being a smoker or from the effusion she developed.  When she had things like this in the past she was given medications to have it cleared up.   Relevant past medical, surgical, family and social history reviewed and updated as indicated. Interim medical history since our last visit reviewed. Allergies and medications reviewed and updated.  Review of Systems  Respiratory:  Positive for cough.   Cardiovascular:  Positive for leg swelling.    Per HPI unless specifically indicated above     Objective:    There were no vitals taken for this visit.  Wt Readings from Last 3 Encounters:  06/16/22 167 lb 5.3 oz (75.9 kg)  05/19/22 155 lb 8 oz (70.5 kg)  05/12/22 157 lb 12.8 oz (71.6 kg)    Physical Exam Vitals and nursing note reviewed.  Constitutional:      General: She is not in acute distress.    Appearance: Normal appearance. She is normal weight. She is not ill-appearing, toxic-appearing or diaphoretic.  HENT:     Head: Normocephalic.     Right Ear: External ear normal.     Left Ear: External ear normal.     Nose: Nose normal.     Mouth/Throat:     Mouth: Mucous membranes are moist.     Pharynx: Oropharynx is clear.  Eyes:     General:        Right eye: No discharge.        Left eye: No discharge.     Extraocular Movements: Extraocular movements intact.     Conjunctiva/sclera: Conjunctivae normal.     Pupils: Pupils are equal, round, and reactive to light.  Cardiovascular:     Rate and Rhythm: Normal  rate and regular rhythm.     Heart sounds: No murmur heard. Pulmonary:     Effort: Pulmonary effort is normal. No respiratory distress.     Breath sounds: Decreased air movement present. Examination of the right-lower field reveals decreased breath sounds. Examination of the left-lower field reveals decreased breath sounds. Decreased breath sounds present. No wheezing or rales.  Musculoskeletal:     Cervical back: Normal range of motion and neck supple.  Skin:    General: Skin is warm and dry.     Capillary Refill: Capillary refill takes less than 2 seconds.  Neurological:     General: No focal deficit present.     Mental Status: She is alert and oriented to person, place, and time. Mental status is at baseline.  Psychiatric:        Mood and Affect: Mood normal.        Behavior: Behavior normal.        Thought Content: Thought content normal.        Judgment: Judgment normal.    Results for orders placed or performed during the hospital encounter of 06/16/22  SARS Coronavirus 2 by RT PCR (hospital order, performed in [facility] hospital lab) *  cepheid single result test* Anterior Nasal Swab   Specimen: Anterior Nasal Swab  Result Value Ref Range   SARS Coronavirus 2 by RT PCR NEGATIVE NEGATIVE  Expectorated Sputum Assessment w Gram Stain, Rflx to Resp Cult   Specimen: Sputum  Result Value Ref Range   Specimen Description SPUTUM    Special Requests NONE    Sputum evaluation      THIS SPECIMEN IS ACCEPTABLE FOR SPUTUM CULTURE Performed at [facility], [address], [place], [place] [address]    Report Status 06/17/2022 FINAL   Culture, Respiratory w Gram Stain   Specimen: SPU  Result Value Ref Range   Specimen Description      SPUTUM Performed at [facility], [address], [place], [place] [address]    Special Requests      NONE Reflexed from [phone] Performed at [facility], [place], [place] [address]    Gram Stain       FEW GRAM POSITIVE COCCI IN PAIRS RARE GRAM POSITIVE RODS ABUNDANT YEAST RARE SQUAMOUS EPITHELIAL CELLS PRESENT RARE WBC PRESENT,BOTH PMN AND MONONUCLEAR    Culture      FEW Normal respiratory flora-no Staph aureus or Pseudomonas seen Performed at [facility], [place] [address] [place],  [address]    Report Status 06/20/2022 FINAL   CBC with Differential  Result Value Ref Range   WBC 4.9 4.0 - 10.5 K/uL   RBC 3.03 (L) 3.87 - 5.11 MIL/uL   Hemoglobin 8.6 (L) 12.0 - 15.0 g/dL   HCT 28.7 (L) 36.0 - 46.0 %   MCV 94.7 80.0 - 100.0 fL   MCH 28.4 26.0 - 34.0 pg   MCHC 30.0 30.0 - 36.0 g/dL   RDW 18.6 (H) 11.5 - 15.5 %   Platelets 84 (L) 150 - 400 K/uL   nRBC 1.4 (H) 0.0 - 0.2 %   Neutrophils Relative % 8 %   Neutro Abs 0.4 (LL) 1.7 - 7.7 K/uL   Lymphocytes Relative 76 %   Lymphs Abs 3.7 0.7 - 4.0 K/uL   Monocytes Relative 12 %   Monocytes Absolute 0.6 0.1 - 1.0 K/uL   Eosinophils Relative 1 %   Eosinophils Absolute 0.1 0.0 - 0.5 K/uL   Basophils Relative 1 %   Basophils Absolute 0.0 0.0 - 0.1 K/uL   WBC Morphology INCREASED LYMPHOCYTES    RBC Morphology MORPHOLOGY UNREMARKABLE    Smear Review Normal platelet morphology    Immature Granulocytes 2 %   Abs Immature Granulocytes 0.09 (H) 0.00 - 0.07 K/uL  Comprehensive metabolic panel  Result Value Ref Range   Sodium 144 135 - 145 mmol/L   Potassium 3.6 3.5 - 5.1 mmol/L   Chloride 110 98 - 111 mmol/L   CO2 26 22 - 32 mmol/L   Glucose, Bld 112 (H) 70 - 99 mg/dL   BUN 23 8 - 23 mg/dL   Creatinine, Ser 0.95 0.44 - 1.00 mg/dL   Calcium 9.4 8.9 - 10.3 mg/dL   Total Protein 5.5 (L) 6.5 - 8.1 g/dL   Albumin 3.4 (L) 3.5 - 5.0 g/dL   AST 26 15 - 41 U/L   ALT 10 0 - 44 U/L   Alkaline Phosphatase 130 (H) 38 - 126 U/L   Total Bilirubin 3.0 (H) 0.3 - 1.2 mg/dL   GFR, Estimated >60 >60 mL/min   Anion gap 8 5 - 15  Brain natriuretic peptide  Result Value Ref Range  B Natriuretic Peptide 170.5 (H) 0.0 - 100.0 pg/mL  Lactic  acid, plasma  Result Value Ref Range   Lactic Acid, Venous 1.2 0.5 - 1.9 mmol/L  Lactic acid, plasma  Result Value Ref Range   Lactic Acid, Venous 1.1 0.5 - 1.9 mmol/L  Pathologist smear review  Result Value Ref Range   Path Review Blood smear is reviewed.   Blood gas, venous  Result Value Ref Range   pH, Ven 7.35 7.25 - 7.43   pCO2, Ven 47 44 - 60 mmHg   pO2, Ven 39 32 - 45 mmHg   Bicarbonate 25.9 20.0 - 28.0 mmol/L   Acid-base deficit 0.2 0.0 - 2.0 mmol/L   O2 Saturation 58.4 %   Patient temperature 37.0    Collection site VEIN   Hemoglobin A1c  Result Value Ref Range   Hgb A1c MFr Bld 5.5 4.8 - 5.6 %   Mean Plasma Glucose 111 mg/dL  TSH  Result Value Ref Range   TSH 3.593 0.350 - 4.500 uIU/mL  Comprehensive metabolic panel  Result Value Ref Range   Sodium 142 135 - 145 mmol/L   Potassium 4.3 3.5 - 5.1 mmol/L   Chloride 110 98 - 111 mmol/L   CO2 24 22 - 32 mmol/L   Glucose, Bld 319 (H) 70 - 99 mg/dL   BUN 29 (H) 8 - 23 mg/dL   Creatinine, Ser 1.06 (H) 0.44 - 1.00 mg/dL   Calcium 8.9 8.9 - 10.3 mg/dL   Total Protein 5.1 (L) 6.5 - 8.1 g/dL   Albumin 3.2 (L) 3.5 - 5.0 g/dL   AST 24 15 - 41 U/L   ALT 11 0 - 44 U/L   Alkaline Phosphatase 112 38 - 126 U/L   Total Bilirubin 2.1 (H) 0.3 - 1.2 mg/dL   GFR, Estimated 55 (L) >60 mL/min   Anion gap 8 5 - 15  CBC  Result Value Ref Range   WBC 3.1 (L) 4.0 - 10.5 K/uL   RBC 2.79 (L) 3.87 - 5.11 MIL/uL   Hemoglobin 7.7 (L) 12.0 - 15.0 g/dL   HCT 26.7 (L) 36.0 - 46.0 %   MCV 95.7 80.0 - 100.0 fL   MCH 27.6 26.0 - 34.0 pg   MCHC 28.8 (L) 30.0 - 36.0 g/dL   RDW 18.6 (H) 11.5 - 15.5 %   Platelets 88 (L) 150 - 400 K/uL   nRBC 1.3 (H) 0.0 - 0.2 %  Vitamin B12  Result Value Ref Range   Vitamin B-12 904 180 - 914 pg/mL  Folate  Result Value Ref Range   Folate 9.1 >5.9 ng/mL  Iron and TIBC  Result Value Ref Range   Iron 52 28 - 170 ug/dL   TIBC 336 250 - 450 ug/dL   Saturation Ratios 16 10.4 - 31.8 %   UIBC 284 ug/dL   Ferritin  Result Value Ref Range   Ferritin 63 11 - 307 ng/mL  Reticulocytes  Result Value Ref Range   Retic Ct Pct 4.2 (H) 0.4 - 3.1 %   RBC. 2.92 (L) 3.87 - 5.11 MIL/uL   Retic Count, Absolute 121.5 19.0 - 186.0 K/uL   Immature Retic Fract 34.4 (H) 2.3 - 15.9 %  Lactate dehydrogenase  Result Value Ref Range   LDH 279 (H) 98 - 192 U/L  Body fluid cell count with differential  Result Value Ref Range   Fluid Type-FCT CYTO PLEU    Color, Fluid YELLOW YELLOW   Appearance, Fluid CLOUDY (  A) CLEAR   Total Nucleated Cell Count, Fluid 3,748 cu mm   Neutrophil Count, Fluid 1 %   Lymphs, Fluid 85 %   Monocyte-Macrophage-Serous Fluid 14 %   Eos, Fluid 0 %  CBG monitoring, ED  Result Value Ref Range   Glucose-Capillary 298 (H) 70 - 99 mg/dL  CBG monitoring, ED  Result Value Ref Range   Glucose-Capillary 297 (H) 70 - 99 mg/dL  Troponin I (High Sensitivity)  Result Value Ref Range   Troponin I (High Sensitivity) 8 <18 ng/L  Troponin I (High Sensitivity)  Result Value Ref Range   Troponin I (High Sensitivity) 7 <18 ng/L      Assessment & Plan:   Problem List Items Addressed This Visit   None    Follow up plan: No follow-ups on file.

## 2022-06-22 ENCOUNTER — Encounter: Payer: Self-pay | Admitting: Nurse Practitioner

## 2022-06-22 ENCOUNTER — Ambulatory Visit (INDEPENDENT_AMBULATORY_CARE_PROVIDER_SITE_OTHER): Payer: Medicare Other | Admitting: Nurse Practitioner

## 2022-06-22 VITALS — BP 100/63 | HR 73 | Temp 98.5°F | Wt 166.4 lb

## 2022-06-22 DIAGNOSIS — L03116 Cellulitis of left lower limb: Secondary | ICD-10-CM | POA: Diagnosis not present

## 2022-06-22 DIAGNOSIS — C8590 Non-Hodgkin lymphoma, unspecified, unspecified site: Secondary | ICD-10-CM

## 2022-06-22 DIAGNOSIS — R0602 Shortness of breath: Secondary | ICD-10-CM | POA: Diagnosis not present

## 2022-06-22 DIAGNOSIS — R059 Cough, unspecified: Secondary | ICD-10-CM | POA: Diagnosis not present

## 2022-06-22 DIAGNOSIS — D649 Anemia, unspecified: Secondary | ICD-10-CM | POA: Diagnosis not present

## 2022-06-22 MED ORDER — CEPHALEXIN 500 MG PO CAPS: 500.0000 mg | ORAL_CAPSULE | Freq: Three times a day (TID) | ORAL | 0 refills | Status: AC

## 2022-06-22 MED ORDER — MUPIROCIN 2 % EX OINT
1.0000 | TOPICAL_OINTMENT | Freq: Two times a day (BID) | CUTANEOUS | 0 refills | Status: DC
Start: 2022-06-22 — End: 2022-06-29

## 2022-06-22 NOTE — Assessment & Plan Note (Addendum)
Chronic. I recommended to patient that she be seen in the ER due to her blood pressure and low Ozygen Saturations.  Patient declined going to the ER via private car or by ambulance.  She was seen 5 days ago and had 865ms removed from right lung.  H/H was down to 7.7/26.7.  Suspect patient would be lower today and possibly need a blood transfusion.  Patient declines blood and blood products when asked during visit.  Lower extremity swelling is worse than previous exam.  Reached out to palliative NP who states she will reach out to her today for follow up appt.  Patient has not had appt with palliative since 05/18/22.  CMP and CBC ordered today.  Follow up in 1 week.

## 2022-06-23 ENCOUNTER — Telehealth: Payer: Self-pay | Admitting: Student

## 2022-06-23 ENCOUNTER — Telehealth: Payer: Self-pay | Admitting: Nurse Practitioner

## 2022-06-23 LAB — CBC WITH DIFFERENTIAL/PLATELET
Basophils Absolute: 0 10*3/uL (ref 0.0–0.2)
Basos: 1 %
EOS (ABSOLUTE): 0.1 10*3/uL (ref 0.0–0.4)
Eos: 2 %
Hematocrit: 26.6 % — ABNORMAL LOW (ref 34.0–46.6)
Hemoglobin: 8 g/dL — ABNORMAL LOW (ref 11.1–15.9)
Immature Grans (Abs): 0.1 10*3/uL (ref 0.0–0.1)
Immature Granulocytes: 2 %
Lymphocytes Absolute: 3.1 10*3/uL (ref 0.7–3.1)
Lymphs: 72 %
MCH: 27.9 pg (ref 26.6–33.0)
MCHC: 30.1 g/dL — ABNORMAL LOW (ref 31.5–35.7)
MCV: 93 fL (ref 79–97)
Monocytes Absolute: 0.6 10*3/uL (ref 0.1–0.9)
Monocytes: 13 %
NRBC: 1 % — ABNORMAL HIGH (ref 0–0)
Neutrophils Absolute: 0.4 10*3/uL — CL (ref 1.4–7.0)
Neutrophils: 10 %
Platelets: 77 10*3/uL — CL (ref 150–450)
RBC: 2.87 x10E6/uL — ABNORMAL LOW (ref 3.77–5.28)
RDW: 17.3 % — ABNORMAL HIGH (ref 11.7–15.4)
WBC: 4.3 10*3/uL (ref 3.4–10.8)

## 2022-06-23 LAB — COMPREHENSIVE METABOLIC PANEL
ALT: 7 IU/L (ref 0–32)
AST: 22 IU/L (ref 0–40)
Albumin/Globulin Ratio: 2.3 — ABNORMAL HIGH (ref 1.2–2.2)
Albumin: 3.4 g/dL — ABNORMAL LOW (ref 3.8–4.8)
Alkaline Phosphatase: 152 IU/L — ABNORMAL HIGH (ref 44–121)
BUN/Creatinine Ratio: 21 (ref 12–28)
BUN: 21 mg/dL (ref 8–27)
Bilirubin Total: 2 mg/dL — ABNORMAL HIGH (ref 0.0–1.2)
CO2: 24 mmol/L (ref 20–29)
Calcium: 9.1 mg/dL (ref 8.7–10.3)
Chloride: 107 mmol/L — ABNORMAL HIGH (ref 96–106)
Creatinine, Ser: 0.98 mg/dL (ref 0.57–1.00)
Globulin, Total: 1.5 g/dL (ref 1.5–4.5)
Glucose: 189 mg/dL — ABNORMAL HIGH (ref 70–99)
Potassium: 4.1 mmol/L (ref 3.5–5.2)
Sodium: 143 mmol/L (ref 134–144)
Total Protein: 4.9 g/dL — ABNORMAL LOW (ref 6.0–8.5)
eGFR: 61 mL/min/{1.73_m2} (ref >59)

## 2022-06-23 NOTE — Telephone Encounter (Signed)
Palliative NP was able to reach patient to follow up on recent ED visit and PCP visit. Patient states she does not want to keep going to ED as it was advised per her PCP yesterday. She does endoerse shortness of breath. When attempting to discuss her disease processes, options regarding her care and arranging a f/u visit, phone became disconnected. NP attempted to call patient back, no answer. Left message for patient to return call.

## 2022-06-23 NOTE — Progress Notes (Signed)
Referral placed.

## 2022-06-23 NOTE — Progress Notes (Signed)
Please let patient know that her lab work still shows anemia as well as having a low white blood cell count.  This means her ability to fight infection is decreased. I still recommend she see Hematology.  If she agrees, I will place the referral.  Her other lab work is stable.  Please keep up coming appt.

## 2022-06-23 NOTE — Addendum Note (Signed)
Addended by: Jon Billings on: 06/23/2022 02:53 PM   Modules accepted: Orders

## 2022-06-23 NOTE — Telephone Encounter (Signed)
Received a phone call at 7:07 that neutrophils were 0.04.  Will call and let patient know today.  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

## 2022-06-29 ENCOUNTER — Telehealth: Payer: Self-pay | Admitting: Oncology

## 2022-06-29 ENCOUNTER — Encounter: Payer: Self-pay | Admitting: Oncology

## 2022-06-29 ENCOUNTER — Inpatient Hospital Stay: Payer: Medicare Other | Attending: Oncology | Admitting: Oncology

## 2022-06-29 ENCOUNTER — Inpatient Hospital Stay: Payer: Medicare Other

## 2022-06-29 DIAGNOSIS — E119 Type 2 diabetes mellitus without complications: Secondary | ICD-10-CM | POA: Diagnosis not present

## 2022-06-29 DIAGNOSIS — Z801 Family history of malignant neoplasm of trachea, bronchus and lung: Secondary | ICD-10-CM | POA: Diagnosis not present

## 2022-06-29 DIAGNOSIS — D649 Anemia, unspecified: Secondary | ICD-10-CM | POA: Diagnosis not present

## 2022-06-29 DIAGNOSIS — R161 Splenomegaly, not elsewhere classified: Secondary | ICD-10-CM | POA: Diagnosis not present

## 2022-06-29 DIAGNOSIS — I700 Atherosclerosis of aorta: Secondary | ICD-10-CM | POA: Insufficient documentation

## 2022-06-29 DIAGNOSIS — Z8249 Family history of ischemic heart disease and other diseases of the circulatory system: Secondary | ICD-10-CM | POA: Diagnosis not present

## 2022-06-29 DIAGNOSIS — R5383 Other fatigue: Secondary | ICD-10-CM | POA: Diagnosis not present

## 2022-06-29 DIAGNOSIS — C8590 Non-Hodgkin lymphoma, unspecified, unspecified site: Secondary | ICD-10-CM | POA: Diagnosis not present

## 2022-06-29 DIAGNOSIS — Z79899 Other long term (current) drug therapy: Secondary | ICD-10-CM | POA: Insufficient documentation

## 2022-06-29 DIAGNOSIS — Z818 Family history of other mental and behavioral disorders: Secondary | ICD-10-CM | POA: Insufficient documentation

## 2022-06-29 DIAGNOSIS — J449 Chronic obstructive pulmonary disease, unspecified: Secondary | ICD-10-CM | POA: Insufficient documentation

## 2022-06-29 DIAGNOSIS — D696 Thrombocytopenia, unspecified: Secondary | ICD-10-CM | POA: Insufficient documentation

## 2022-06-29 DIAGNOSIS — Z803 Family history of malignant neoplasm of breast: Secondary | ICD-10-CM | POA: Insufficient documentation

## 2022-06-29 DIAGNOSIS — R600 Localized edema: Secondary | ICD-10-CM | POA: Diagnosis not present

## 2022-06-29 DIAGNOSIS — R809 Proteinuria, unspecified: Secondary | ICD-10-CM | POA: Diagnosis not present

## 2022-06-29 DIAGNOSIS — D61818 Other pancytopenia: Secondary | ICD-10-CM | POA: Insufficient documentation

## 2022-06-29 DIAGNOSIS — J90 Pleural effusion, not elsewhere classified: Secondary | ICD-10-CM | POA: Diagnosis not present

## 2022-06-29 DIAGNOSIS — B0229 Other postherpetic nervous system involvement: Secondary | ICD-10-CM | POA: Diagnosis not present

## 2022-06-29 DIAGNOSIS — D709 Neutropenia, unspecified: Secondary | ICD-10-CM | POA: Insufficient documentation

## 2022-06-29 DIAGNOSIS — F17210 Nicotine dependence, cigarettes, uncomplicated: Secondary | ICD-10-CM | POA: Insufficient documentation

## 2022-06-29 LAB — CBC WITH DIFFERENTIAL/PLATELET
Abs Immature Granulocytes: 0.08 10*3/uL — ABNORMAL HIGH (ref 0.00–0.07)
Basophils Absolute: 0.1 10*3/uL (ref 0.0–0.1)
Basophils Relative: 1 %
Eosinophils Absolute: 0.1 10*3/uL (ref 0.0–0.5)
Eosinophils Relative: 1 %
HCT: 26.6 % — ABNORMAL LOW (ref 36.0–46.0)
Hemoglobin: 7.9 g/dL — ABNORMAL LOW (ref 12.0–15.0)
Immature Granulocytes: 2 %
Lymphocytes Relative: 78 %
Lymphs Abs: 4.2 10*3/uL — ABNORMAL HIGH (ref 0.7–4.0)
MCH: 28.2 pg (ref 26.0–34.0)
MCHC: 29.7 g/dL — ABNORMAL LOW (ref 30.0–36.0)
MCV: 95 fL (ref 80.0–100.0)
Monocytes Absolute: 0.6 10*3/uL (ref 0.1–1.0)
Monocytes Relative: 11 %
Neutro Abs: 0.4 10*3/uL — CL (ref 1.7–7.7)
Neutrophils Relative %: 7 %
Platelets: 83 10*3/uL — ABNORMAL LOW (ref 150–400)
RBC: 2.8 MIL/uL — ABNORMAL LOW (ref 3.87–5.11)
RDW: 18.9 % — ABNORMAL HIGH (ref 11.5–15.5)
WBC: 5.5 10*3/uL (ref 4.0–10.5)
nRBC: 2.2 % — ABNORMAL HIGH (ref 0.0–0.2)

## 2022-06-29 LAB — FOLATE: Folate: 8.3 ng/mL (ref >5.9)

## 2022-06-29 LAB — COMPREHENSIVE METABOLIC PANEL
ALT: 11 U/L (ref 0–44)
AST: 24 U/L (ref 15–41)
Albumin: 3.3 g/dL — ABNORMAL LOW (ref 3.5–5.0)
Alkaline Phosphatase: 130 U/L — ABNORMAL HIGH (ref 38–126)
Anion gap: 11 (ref 5–15)
BUN: 28 mg/dL — ABNORMAL HIGH (ref 8–23)
CO2: 28 mmol/L (ref 22–32)
Calcium: 9.2 mg/dL (ref 8.9–10.3)
Chloride: 101 mmol/L (ref 98–111)
Creatinine, Ser: 1.06 mg/dL — ABNORMAL HIGH (ref 0.44–1.00)
GFR, Estimated: 55 mL/min — ABNORMAL LOW (ref >60)
Glucose, Bld: 140 mg/dL — ABNORMAL HIGH (ref 70–99)
Potassium: 4.2 mmol/L (ref 3.5–5.1)
Sodium: 140 mmol/L (ref 135–145)
Total Bilirubin: 2.2 mg/dL — ABNORMAL HIGH (ref 0.3–1.2)
Total Protein: 5.6 g/dL — ABNORMAL LOW (ref 6.5–8.1)

## 2022-06-29 LAB — TSH: TSH: 9.444 u[IU]/mL — ABNORMAL HIGH (ref 0.350–4.500)

## 2022-06-29 LAB — RETICULOCYTES
Immature Retic Fract: 29.3 % — ABNORMAL HIGH (ref 2.3–15.9)
RBC.: 2.82 MIL/uL — ABNORMAL LOW (ref 3.87–5.11)
Retic Count, Absolute: 131.1 10*3/uL (ref 19.0–186.0)
Retic Ct Pct: 4.7 % — ABNORMAL HIGH (ref 0.4–3.1)

## 2022-06-29 LAB — VITAMIN B12: Vitamin B-12: 1087 pg/mL — ABNORMAL HIGH (ref 180–914)

## 2022-06-29 LAB — HEPATITIS B SURFACE ANTIGEN: Hepatitis B Surface Ag: NONREACTIVE

## 2022-06-29 LAB — LACTATE DEHYDROGENASE: LDH: 317 U/L — ABNORMAL HIGH (ref 98–192)

## 2022-06-29 LAB — URIC ACID: Uric Acid, Serum: 8.1 mg/dL — ABNORMAL HIGH (ref 2.5–7.1)

## 2022-06-29 LAB — FERRITIN: Ferritin: 77 ng/mL (ref 11–307)

## 2022-06-29 NOTE — Telephone Encounter (Signed)
Left VM with PET scan information and instructions (at [facility]).

## 2022-06-29 NOTE — Telephone Encounter (Signed)
VM left with patient with all details about PET scan scheduled 9/13. Patient is to arrive 30 min early, have nothing to eat or drink for 6 hours prior except for water, watch carbs prior and scan will be done at [facility]. Left [facility] address and phone number as well.

## 2022-06-29 NOTE — Progress Notes (Signed)
Hematology/Oncology Consult note [facility] [phone] [phone]  Patient Care Team: Jon Billings, NP as PCP - General (Nurse Practitioner) [name], RN (Inactive) as Case Manager   Name of the patient: Natasha [name]  [id number]  09-Nov-1945    Reason for referral-anemia   Referring physician-Karen Mathis Dad, NP  Date of visit: 06/29/22   History of presenting illness- Patient is a 76 year old female with a past medical history significant for type 2 diabetes COPD postherpetic neuralgia who has been referred for anemia.  Her most recent CBC from 06/22/2022 showed white count of 4.3, H&H of 8/26.6 with an MCV of 93 and a platelet count of 77.  Looking back at her prior CBCs her hemoglobin was normal at 13.3 until November 2020 and since June 2023 her hemoglobin has been fluctuating between 8.5-9.5.  Platelets were normal up until November 2020 but fluctuating between 70-80 since June 2023.  Her hemoglobin was normal at 13 in May 2021 as well and we do not have any labs between 2021 and 2023.  Patient had a CT chest abdomen and pelvis with contrast which showed bulky bilateral axillary mediastinal and hilar adenopathy with the largest axillary node measuring 3.3 x 2.5 cm as compared to 1.7 cm back in 2020.  Severe splenomegaly of 21.9 cm.  Bulky intra-abdominal adenopathy.  Moderate right pleural effusion  Patient prefers not to address her lymphoma and only wants to take care of her anemia and low white blood cell count.  She is fearful that any form of chemotherapy for possible lymphoma will make her lose her hair.  She has been feeling poorly overall with increasing fatigue and bilateral lower extremity swelling.  ECOG PS- 2-3  Pain scale- 0   Review of systems- Review of Systems  Constitutional:  Positive for malaise/fatigue. Negative for chills, fever and weight loss.  HENT:  Negative for congestion, ear discharge and  nosebleeds.   Eyes:  Negative for blurred vision.  Respiratory:  Negative for cough, hemoptysis, sputum production, shortness of breath and wheezing.   Cardiovascular:  Positive for leg swelling. Negative for chest pain, palpitations, orthopnea and claudication.  Gastrointestinal:  Negative for abdominal pain, blood in stool, constipation, diarrhea, heartburn, melena, nausea and vomiting.  Genitourinary:  Negative for dysuria, flank pain, frequency, hematuria and urgency.  Musculoskeletal:  Negative for back pain, joint pain and myalgias.  Skin:  Negative for rash.  Neurological:  Negative for dizziness, tingling, focal weakness, seizures, weakness and headaches.  Endo/Heme/Allergies:  Does not bruise/bleed easily.  Psychiatric/Behavioral:  Negative for depression and suicidal ideas. The patient does not have insomnia.     No Known Allergies  Patient Active Problem List   Diagnosis Date Noted   Pleural effusion 05/12/2022   Lower extremity edema 05/12/2022   Pancytopenia ([place]) 04/16/2022   Type 2 diabetes mellitus with proteinuria (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   Diabetes mellitus  without complication ([place])    Vaginal delivery    x 5     Past Surgical History:  Procedure Laterality Date   TONSILLECTOMY Bilateral    as a child    Social History   Socioeconomic History   Marital status: Widowed    Spouse  name: Not on file   Number of children: 2   Years of education: Not on file   Highest education level: 9th grade  Occupational History   Occupation: unemployed  Tobacco Use   Smoking status: Every Day    Packs/day: 1.00    Years: 55.00    Total pack years: 55.00    Types: Cigarettes    Start date: 07/07/1961   Smokeless tobacco: Never  Vaping Use   Vaping Use: Never used  Substance and Sexual Activity   Alcohol use: No   Drug use: Never   Sexual activity: Yes    Partners: Male    Birth control/protection: Post-menopausal  Other Topics Concern   Not on file  Social History Narrative   08/14/20   From: [place]   Living: to be near daughter   Work: retired      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      Enjoys: stays at home      Exercise: walking to her daughter's store   Diet: eats fruit, air fried chicken      Safety   Seat belts: Yes    Guns: No   Safe in relationships: Yes    Social Determinants of Health   Financial Resource Strain: High Risk (07/07/2018)   Overall Financial Resource Strain (CARDIA)    Difficulty of Paying Living Expenses: Very hard  Food Insecurity: Food Insecurity Present (07/07/2018)   Hunger Vital Sign    Worried About Running Out of Food in the Last Year: Often true    Ran Out of Food in the Last Year: Often true  Transportation Needs: No Transportation Needs (07/07/2018)   PRAPARE - Hydrologist (Medical): No    Lack of Transportation (Non-Medical): No  Physical Activity: Insufficiently Active (07/07/2018)   Exercise Vital Sign    Days of Exercise per Week: 7 days    Minutes of Exercise per Session: 10 min  Stress: No Stress Concern Present (07/07/2018)   [place]    Feeling of Stress : Not at all  Social Connections: Somewhat Isolated (07/07/2018)   Social Connection and  Isolation Panel [NHANES]    Frequency of Communication with Friends and Family: Never    Frequency of Social Gatherings with Friends and Family: More than three times a week    Attends Religious Services: More than 4 times per year    Active Member of [employer] or Organizations: No    Attends Archivist Meetings: Never    Marital Status: Widowed  Intimate Partner Violence: Not At Risk (07/07/2018)   Humiliation, Afraid, Rape, and Kick questionnaire    Fear of Current or Ex-Partner: No    Emotionally Abused: No    Physically Abused: No    Sexually Abused: No     Family History  Problem Relation Age of Onset   Breast cancer Mother    Alzheimer's disease Father    Lung cancer Father        lung   Varicose Veins Brother    Heart attack Brother  Current Outpatient Medications:    albuterol (VENTOLIN HFA) 108 (90 Base) MCG/ACT inhaler, TAKE 2 PUFFS BY MOUTH EVERY 6 HOURS AS NEEDED FOR WHEEZE OR SHORTNESS OF BREATH, Disp: 18 g, Rfl: 1   benzonatate (TESSALON) 200 MG capsule, Take 1 capsule (200 mg total) by mouth 3 (three) times daily., Disp: 20 capsule, Rfl: 0   cephALEXin (KEFLEX) 500 MG capsule, Take 1 capsule (500 mg total) by mouth 3 (three) times daily for 10 days., Disp: 30 capsule, Rfl: 0   chlorpheniramine-HYDROcodone (TUSSIONEX) 10-8 MG/5ML, Take 5 mLs by mouth every 12 (twelve) hours for 12 days., Disp: 115 mL, Rfl: 0   cyclobenzaprine (FLEXERIL) 10 MG tablet, Take 1 tablet (10 mg total) by mouth at bedtime., Disp: 30 tablet, Rfl: 0   Ipratropium-Albuterol (COMBIVENT RESPIMAT) 20-100 MCG/ACT AERS respimat, Inhale 1 puff into the lungs every 6 (six) hours., Disp: , Rfl:    Physical exam:  Physical Exam Constitutional:      General: She is not in acute distress.    Comments: Exam somewhat limited as she is unable to sit up on the examination table  Cardiovascular:     Rate and Rhythm: Normal rate and regular rhythm.     Heart sounds: Normal heart sounds.   Pulmonary:     Effort: Pulmonary effort is normal.     Breath sounds: Normal breath sounds.  Abdominal:     General: Bowel sounds are normal.     Palpations: Abdomen is soft.     Comments: No palpable splenomegaly  Musculoskeletal:     Right lower leg: Edema present.     Left lower leg: Edema present.  Lymphadenopathy:     Comments: Palpable axillary adenopathy  Skin:    General: Skin is warm and dry.  Neurological:     Mental Status: She is alert and oriented to person, place, and time.           Latest Ref Rng & Units 06/22/2022    9:39 AM  CMP  Glucose 70 - 99 mg/dL 189   BUN 8 - 27 mg/dL 21   Creatinine 0.57 - 1.00 mg/dL 0.98   Sodium 134 - 144 mmol/L 143   Potassium 3.5 - 5.2 mmol/L 4.1   Chloride 96 - 106 mmol/L 107   CO2 20 - 29 mmol/L 24   Calcium 8.7 - 10.3 mg/dL 9.1   Total Protein 6.0 - 8.5 g/dL 4.9   Total Bilirubin 0.0 - 1.2 mg/dL 2.0   Alkaline Phos 44 - 121 IU/L 152   AST 0 - 40 IU/L 22   ALT 0 - 32 IU/L 7       Latest Ref Rng & Units 06/22/2022    9:39 AM  CBC  WBC 3.4 - 10.8 x10E3/uL 4.3   Hemoglobin 11.1 - 15.9 g/dL 8.0   Hematocrit 34.0 - 46.6 % 26.6   Platelets 150 - 450 x10E3/uL 77     No images are attached to the encounter.  US THORACENTESIS ASP PLEURAL SPACE W/IMG GUIDE  Result Date: 06/17/2022 INDICATION: Patient with history of lymphoma and shortness of breath with right-sided pleural effusion request received for diagnostic and therapeutic thoracentesis. EXAM: ULTRASOUND GUIDED RIGHT THORACENTESIS MEDICATIONS: Local 1% lidocaine only. COMPLICATIONS: None immediate. PROCEDURE: An ultrasound guided thoracentesis was thoroughly discussed with the patient and questions answered. The benefits, risks, alternatives and complications were also discussed. The patient understands and wishes to proceed with the procedure. Written consent was obtained. Ultrasound was performed to localize  and mark an adequate pocket of fluid in the right chest. The  area was then prepped and draped in the normal sterile fashion. 1% Lidocaine was used for local anesthesia. Under ultrasound guidance a 19 gauge, 7-cm, Yueh catheter was introduced. Thoracentesis was performed. The catheter was removed and a dressing applied. FINDINGS: A total of approximately 850 mL of amber colored fluid was removed. Samples were sent to the laboratory as requested by the clinical team. IMPRESSION: Successful ultrasound guided right thoracentesis yielding 850 mL of pleural fluid. This exam was performed by Tsosie Billing PA-C, and was supervised and interpreted by Dr. Denna Haggard. Electronically Signed   By: Albin Felling M.D.   On: 06/17/2022 13:15   DG Chest Port 1 View  Result Date: 06/17/2022 CLINICAL DATA:  Status post thoracentesis on the right. EXAM: PORTABLE CHEST 1 VIEW COMPARISON:  Chest radiograph dated 06/16/2022. FINDINGS: The heart size and mediastinal contours are within normal limits. There is a small right pleural effusion with associated atelectasis/airspace disease, decreased since prior exam. A previously described pulmonary nodule in the right upper lobe is obscured and not well seen on today's exam. There is a mild left basilar atelectasis and a small left pleural effusion likely contributes. Mild diffuse bilateral interstitial opacities likely represent pulmonary edema. There is no pneumothorax. Degenerative changes are seen in the spine. IMPRESSION: Interval decrease of a right pleural effusion which is now small in size. There is likely a trace left pleural effusion. Electronically Signed   By: Zerita Boers M.D.   On: 06/17/2022 12:28   CT Angio Chest PE W and/or Wo Contrast  Result Date: 06/16/2022 CLINICAL DATA:  Concern for pulmonary embolism. History of lymphoma. EXAM: CT ANGIOGRAPHY CHEST WITH CONTRAST TECHNIQUE: Multidetector CT imaging of the chest was performed using the standard protocol during bolus administration of intravenous contrast. Multiplanar CT image  reconstructions and MIPs were obtained to evaluate the vascular anatomy. RADIATION DOSE REDUCTION: This exam was performed according to the departmental dose-optimization program which includes automated exposure control, adjustment of the mA and/or kV according to patient size and/or use of iterative reconstruction technique. CONTRAST:  71m OMNIPAQUE IOHEXOL 350 MG/ML SOLN COMPARISON:  Chest radiograph dated 06/16/2022 and CT dated 04/24/2022. FINDINGS: Evaluation of this exam is limited due to respiratory motion artifact. Cardiovascular: There is no cardiomegaly or pericardial effusion. The thoracic aorta is unremarkable. The origins of the great vessels of the aortic arch appear patent. Evaluation of the pulmonary arteries is limited due to respiratory motion. No pulmonary artery embolus identified. Mediastinum/Nodes: Bilateral hilar and mediastinal adenopathy. Right hilar adenopathy measures 2.3 cm short axis. Prevascular adenopathy measures 18 mm short axis. Subcarinal adenopathy measures 18 mm short axis. Scattered upper mediastinal, supraclavicular as well as bilateral axillary adenopathy noted. Left axillary adenopathy measures 16 mm in short axis. Right axillary adenopathy measures 18 mm short axis. Lungs/Pleura: Moderate right and small left pleural effusions similar to prior CT. There is compressive atelectasis of the majority of the right lower lobe versus pneumonia. A 1.8 cm nodular density with areas of calcification in the right upper lobe along the pleural surface as seen on the prior CT. There is no pneumothorax. The central airways are patent. Upper Abdomen: Splenomegaly.  Upper abdominal adenopathy. Musculoskeletal: Diffuse subcutaneous edema and anasarca. No acute osseous pathology. Review of the MIP images confirms the above findings. IMPRESSION: 1. No CT evidence of pulmonary artery embolus. 2. Moderate right and small left pleural effusions with compressive atelectasis of the majority of  the  right lower lobe versus pneumonia. 3. Mediastinal, bilateral hilar, bilateral axillary, and upper abdominal adenopathy consistent with history of lymphoma. 4. Splenomegaly. Electronically Signed   By: Anner Crete M.D.   On: 06/16/2022 23:58   DG Chest Portable 1 View  Result Date: 06/16/2022 CLINICAL DATA:  Dyspnea EXAM: PORTABLE CHEST 1 VIEW COMPARISON:  04/24/2022 FINDINGS: Moderate to large right and small left pleural effusions are seen, enlarged since prior examination on the right and new on the left. Stable 19 mm nodule within the right mid lung zone. No pneumothorax. Cardiac size is within normal limits. Pulmonary vascularity is normal. No acute bone abnormality. IMPRESSION: 1. Moderate to large right and small left pleural effusions, enlarged since prior examination. 2. Stable right pulmonary nodule, previously characterized as a pulmonary hamartoma on 04/24/2022. Electronically Signed   By: Fidela Salisbury M.D.   On: 06/16/2022 22:24    Assessment and plan- Patient is a 76 y.o. female for leukopenia and anemia  Most recent labs from August 2023 showed a white cell count of 4.3 with an ANC of 0.4.  Patient has had worsening neutropenia since July 2023 platelets have been chronically low since June 2023 and they fluctuate between 70-80.  Back in 2020 patient had normal counts.  Her hemoglobin which was 13 in November 2020 has drifted down to 8.5.  I explained to the patient that her anemia thrombocytopenia and neutropenia are likely secondary to her underlying lymphoma and as such cannot be separated from each other.  She had a CT angio chest in August 2023 which showed moderate right pleural effusion and small left pleural effusion as well as bilateral axillary hilar and mediastinal adenopathy.  She was known to have splenomegaly as well.  June 2023 CT scan showed worsening intra-abdominal adenopathy as well.  I am concerned we are dealing with worsening lymphoma and her cytopenias are unlikely  to get better until a diagnosis of her lymphoma is made and treatment is considered.  Patient is still not sure if she wants to proceed to any treatment for possible lymphoma but is willing to undergo work-up at this time  I will start off with a PET CT scan followed by lymph node biopsy.  I am holding off on bone marrow biopsy given patient's hesitation to pursue any treatment for it in general.  I will see her back after PET scan and biopsy results are back.  I am obtaining baseline labs today including a complete anemia work-up, peripheral flow cytometry and hepatitis and HIV testing.   Thank you for this kind referral and the opportunity to participate in the care of this  Patient   Visit Diagnosis 1. Normocytic anemia   2. 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HCC)     Dr. Randa Evens, MD, MPH [facility] [phone] 06/29/2022

## 2022-06-30 ENCOUNTER — Encounter: Payer: Self-pay | Admitting: Nurse Practitioner

## 2022-06-30 ENCOUNTER — Encounter: Payer: Self-pay | Admitting: Oncology

## 2022-06-30 ENCOUNTER — Other Ambulatory Visit: Payer: Self-pay | Admitting: *Deleted

## 2022-06-30 DIAGNOSIS — U071 COVID-19: Secondary | ICD-10-CM | POA: Diagnosis not present

## 2022-06-30 DIAGNOSIS — R195 Other fecal abnormalities: Secondary | ICD-10-CM | POA: Diagnosis not present

## 2022-06-30 DIAGNOSIS — R059 Cough, unspecified: Secondary | ICD-10-CM | POA: Diagnosis not present

## 2022-06-30 DIAGNOSIS — C8590 Non-Hodgkin lymphoma, unspecified, unspecified site: Secondary | ICD-10-CM

## 2022-06-30 DIAGNOSIS — R0602 Shortness of breath: Secondary | ICD-10-CM | POA: Diagnosis not present

## 2022-06-30 LAB — HEPATITIS C ANTIBODY: HCV Ab: NONREACTIVE

## 2022-06-30 LAB — HEPATITIS B SURFACE ANTIBODY, QUANTITATIVE: Hep B S AB Quant (Post): >1000 m[IU]/mL (ref >9.9)

## 2022-06-30 LAB — HAPTOGLOBIN: Haptoglobin: 85 mg/dL (ref 42–346)

## 2022-06-30 LAB — HIV ANTIBODY (ROUTINE TESTING W REFLEX): HIV Screen 4th Generation wRfx: NONREACTIVE

## 2022-06-30 LAB — HEPATITIS B CORE ANTIBODY, TOTAL: Hep B Core Total Ab: REACTIVE — AB

## 2022-07-01 DIAGNOSIS — E118 Type 2 diabetes mellitus with unspecified complications: Secondary | ICD-10-CM | POA: Diagnosis not present

## 2022-07-01 DIAGNOSIS — E1122 Type 2 diabetes mellitus with diabetic chronic kidney disease: Secondary | ICD-10-CM | POA: Diagnosis not present

## 2022-07-01 DIAGNOSIS — R0602 Shortness of breath: Secondary | ICD-10-CM | POA: Diagnosis not present

## 2022-07-01 DIAGNOSIS — R059 Cough, unspecified: Secondary | ICD-10-CM | POA: Diagnosis not present

## 2022-07-01 NOTE — Telephone Encounter (Signed)
Pt scheduled 9/11

## 2022-07-02 ENCOUNTER — Encounter: Payer: Self-pay | Admitting: *Deleted

## 2022-07-02 DIAGNOSIS — E1122 Type 2 diabetes mellitus with diabetic chronic kidney disease: Secondary | ICD-10-CM | POA: Diagnosis not present

## 2022-07-02 DIAGNOSIS — U071 COVID-19: Secondary | ICD-10-CM | POA: Diagnosis not present

## 2022-07-02 DIAGNOSIS — R0602 Shortness of breath: Secondary | ICD-10-CM | POA: Diagnosis not present

## 2022-07-02 DIAGNOSIS — R059 Cough, unspecified: Secondary | ICD-10-CM | POA: Diagnosis not present

## 2022-07-02 DIAGNOSIS — E118 Type 2 diabetes mellitus with unspecified complications: Secondary | ICD-10-CM | POA: Diagnosis not present

## 2022-07-02 NOTE — Progress Notes (Unsigned)
There were no vitals taken for this visit.   Subjective:    Patient ID: Natasha [name], female    DOB: 09-17-1946, 76 y.o.   MRN: [id number]  HPI: Natasha [name] is a 76 y.o. female  No chief complaint on file.    Patient states she is having trouble breathing, swelling is worse.  She went to the hospital 5 days ago and had 851m of fluid taken off her pleural effusion.  She just wants the cough to go away.  Patient states when she left the hospital 5 days ago she felt the same as she did when she went to the hospital.  She does not want blood or blood products still. The swelling in her legs has worsened.  Patient states she got bit by a bug recently and the area on her leg won't heal.  It is very painful on her LLE.   Relevant past medical, surgical, family and social history reviewed and updated as indicated. Interim medical history since our last visit reviewed. Allergies and medications reviewed and updated.  Review of Systems  Respiratory:  Positive for cough and shortness of breath.   Cardiovascular:  Positive for leg swelling.    Per HPI unless specifically indicated above     Objective:    There were no vitals taken for this visit.  Wt Readings from Last 3 Encounters:  06/22/22 166 lb 6.4 oz (75.5 kg)  06/16/22 167 lb 5.3 oz (75.9 kg)  05/19/22 155 lb 8 oz (70.5 kg)    Physical Exam Vitals and nursing note reviewed.  Constitutional:      General: She is not in acute distress.    Appearance: Normal appearance. She is normal weight. She is not ill-appearing, toxic-appearing or diaphoretic.  HENT:     Head: Normocephalic.     Right Ear: External ear normal.     Left Ear: External ear normal.     Nose: Nose normal.     Mouth/Throat:     Mouth: Mucous membranes are moist.     Pharynx: Oropharynx is clear.  Eyes:     General:        Right eye: No discharge.        Left eye: No discharge.     Extraocular Movements: Extraocular movements intact.      Conjunctiva/sclera: Conjunctivae normal.     Pupils: Pupils are equal, round, and reactive to light.  Cardiovascular:     Rate and Rhythm: Normal rate and regular rhythm.     Heart sounds: No murmur heard. Pulmonary:     Effort: Pulmonary effort is normal. No respiratory distress.     Breath sounds: Decreased air movement present. Examination of the right-lower field reveals decreased breath sounds. Examination of the left-lower field reveals decreased breath sounds. Decreased breath sounds and wheezing present. No rales.  Musculoskeletal:     Cervical back: Normal range of motion and neck supple.     Right lower leg: 2+ Pitting Edema present.     Left lower leg: 2+ Pitting Edema present.  Skin:    General: Skin is warm and dry.     Capillary Refill: Capillary refill takes less than 2 seconds.       Neurological:     General: No focal deficit present.     Mental Status: She is alert and oriented to person, place, and time. Mental status is at baseline.  Psychiatric:        Mood and Affect:  Mood normal.        Behavior: Behavior normal.        Thought Content: Thought content normal.        Judgment: Judgment normal.    Results for orders placed or performed in visit on 06/29/22  Uric acid  Result Value Ref Range   Uric Acid, Serum 8.1 (H) 2.5 - 7.1 mg/dL  Lactate dehydrogenase  Result Value Ref Range   LDH 317 (H) 98 - 192 U/L  Hepatitis C antibody  Result Value Ref Range   HCV Ab NON REACTIVE NON REACTIVE  Hepatitis B surface antigen  Result Value Ref Range   Hepatitis B Surface Ag NON REACTIVE NON REACTIVE  Hepatitis B surface antibody,quantitative  Result Value Ref Range   Hep B S AB Quant (Post) >1,000.0 Immunity>9.9 mIU/mL  HIV ANTIBODY (ROUTINE TETSING W RELFEX)  Result Value Ref Range   HIV Screen 4th Generation wRfx Non Reactive Non Reactive  TSH  Result Value Ref Range   TSH 9.444 (H) 0.350 - 4.500 uIU/mL  Hepatitis B core antibody, total  Result Value Ref  Range   Hep B Core Total Ab Reactive (A) NON REACTIVE  Haptoglobin  Result Value Ref Range   Haptoglobin 85 42 - 346 mg/dL  Reticulocytes  Result Value Ref Range   Retic Ct Pct 4.7 (H) 0.4 - 3.1 %   RBC. 2.82 (L) 3.87 - 5.11 MIL/uL   Retic Count, Absolute 131.1 19.0 - 186.0 K/uL   Immature Retic Fract 29.3 (H) 2.3 - 15.9 %  Ferritin  Result Value Ref Range   Ferritin 77 11 - 307 ng/mL  Vitamin B12  Result Value Ref Range   Vitamin B-12 1,087 (H) 180 - 914 pg/mL  Folate  Result Value Ref Range   Folate 8.3 >5.9 ng/mL  Comprehensive metabolic panel  Result Value Ref Range   Sodium 140 135 - 145 mmol/L   Potassium 4.2 3.5 - 5.1 mmol/L   Chloride 101 98 - 111 mmol/L   CO2 28 22 - 32 mmol/L   Glucose, Bld 140 (H) 70 - 99 mg/dL   BUN 28 (H) 8 - 23 mg/dL   Creatinine, Ser 1.06 (H) 0.44 - 1.00 mg/dL   Calcium 9.2 8.9 - 10.3 mg/dL   Total Protein 5.6 (L) 6.5 - 8.1 g/dL   Albumin 3.3 (L) 3.5 - 5.0 g/dL   AST 24 15 - 41 U/L   ALT 11 0 - 44 U/L   Alkaline Phosphatase 130 (H) 38 - 126 U/L   Total Bilirubin 2.2 (H) 0.3 - 1.2 mg/dL   GFR, Estimated 55 (L) >60 mL/min   Anion gap 11 5 - 15  CBC with Differential/Platelet  Result Value Ref Range   WBC 5.5 4.0 - 10.5 K/uL   RBC 2.80 (L) 3.87 - 5.11 MIL/uL   Hemoglobin 7.9 (L) 12.0 - 15.0 g/dL   HCT 26.6 (L) 36.0 - 46.0 %   MCV 95.0 80.0 - 100.0 fL   MCH 28.2 26.0 - 34.0 pg   MCHC 29.7 (L) 30.0 - 36.0 g/dL   RDW 18.9 (H) 11.5 - 15.5 %   Platelets 83 (L) 150 - 400 K/uL   nRBC 2.2 (H) 0.0 - 0.2 %   Neutrophils Relative % 7 %   Neutro Abs 0.4 (LL) 1.7 - 7.7 K/uL   Lymphocytes Relative 78 %   Lymphs Abs 4.2 (H) 0.7 - 4.0 K/uL   Monocytes Relative 11 %   Monocytes Absolute  0.6 0.1 - 1.0 K/uL   Eosinophils Relative 1 %   Eosinophils Absolute 0.1 0.0 - 0.5 K/uL   Basophils Relative 1 %   Basophils Absolute 0.1 0.0 - 0.1 K/uL   Immature Granulocytes 2 %   Abs Immature Granulocytes 0.08 (H) 0.00 - 0.07 K/uL      Assessment & Plan:    Problem List Items Addressed This Visit   None    Follow up plan: No follow-ups on file.

## 2022-07-05 ENCOUNTER — Encounter: Payer: Self-pay | Admitting: Nurse Practitioner

## 2022-07-05 ENCOUNTER — Telehealth (INDEPENDENT_AMBULATORY_CARE_PROVIDER_SITE_OTHER): Payer: Medicare Other | Admitting: Nurse Practitioner

## 2022-07-05 VITALS — HR 91 | Resp 15 | Ht 62.0 in | Wt 152.0 lb

## 2022-07-05 DIAGNOSIS — R0602 Shortness of breath: Secondary | ICD-10-CM

## 2022-07-05 DIAGNOSIS — N393 Stress incontinence (female) (male): Secondary | ICD-10-CM

## 2022-07-05 DIAGNOSIS — C8590 Non-Hodgkin lymphoma, unspecified, unspecified site: Secondary | ICD-10-CM

## 2022-07-05 DIAGNOSIS — D61818 Other pancytopenia: Secondary | ICD-10-CM | POA: Diagnosis not present

## 2022-07-05 LAB — MULTIPLE MYELOMA PANEL, SERUM
Albumin SerPl Elph-Mcnc: 3 g/dL (ref 2.9–4.4)
Albumin/Glob SerPl: 1.6 (ref 0.7–1.7)
Alpha 1: 0.3 g/dL (ref 0.0–0.4)
Alpha2 Glob SerPl Elph-Mcnc: 0.5 g/dL (ref 0.4–1.0)
B-Globulin SerPl Elph-Mcnc: 0.8 g/dL (ref 0.7–1.3)
Gamma Glob SerPl Elph-Mcnc: 0.3 g/dL — ABNORMAL LOW (ref 0.4–1.8)
Globulin, Total: 1.9 g/dL — ABNORMAL LOW (ref 2.2–3.9)
IgA: 21 mg/dL — ABNORMAL LOW (ref 64–422)
IgG (Immunoglobin G), Serum: 345 mg/dL — ABNORMAL LOW (ref 586–1602)
IgM (Immunoglobulin M), Srm: 15 mg/dL — ABNORMAL LOW (ref 26–217)
Total Protein ELP: 4.9 g/dL — ABNORMAL LOW (ref 6.0–8.5)

## 2022-07-05 NOTE — Assessment & Plan Note (Signed)
Home health order placed for patient during visit.  Did discuss with patient that home health may not be appropriate due to nature of her illness.  Palliative/Hospice is likely more appropriate if patient decides not to pursue treatment. Patient would like to pursue home health first.

## 2022-07-05 NOTE — Assessment & Plan Note (Signed)
Patient is working with Hematology.  She is considering treatment for Lymphoma.  Has another appt with Hematology in two weeks.

## 2022-07-05 NOTE — Assessment & Plan Note (Signed)
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  Patient's O2 sats are in the high 80s-90s.  Will refer to Pulmonology for initiation of Oxygen.

## 2022-07-06 ENCOUNTER — Telehealth: Payer: Self-pay | Admitting: *Deleted

## 2022-07-06 NOTE — Telephone Encounter (Signed)
-----   Message from Sindy Guadeloupe, MD sent at 06/30/2022  8:28 AM EDT ----- Whenever she comes for pet I would like her to get blood work for flow cytometry and hep B core antibody (igM not total)

## 2022-07-06 NOTE — Telephone Encounter (Signed)
I called the pt and left voicemail as well as [name] the roommate to see if everyone  is ok so we can get the scan set up again for her. Left my direct phone number

## 2022-07-07 DIAGNOSIS — L309 Dermatitis, unspecified: Secondary | ICD-10-CM | POA: Diagnosis not present

## 2022-07-07 DIAGNOSIS — E1122 Type 2 diabetes mellitus with diabetic chronic kidney disease: Secondary | ICD-10-CM | POA: Diagnosis not present

## 2022-07-07 DIAGNOSIS — U071 COVID-19: Secondary | ICD-10-CM | POA: Diagnosis not present

## 2022-07-07 DIAGNOSIS — R059 Cough, unspecified: Secondary | ICD-10-CM | POA: Diagnosis not present

## 2022-07-08 ENCOUNTER — Encounter: Payer: Self-pay | Admitting: *Deleted

## 2022-07-08 ENCOUNTER — Encounter: Payer: Self-pay | Admitting: Nurse Practitioner

## 2022-07-08 DIAGNOSIS — R0602 Shortness of breath: Secondary | ICD-10-CM

## 2022-07-11 ENCOUNTER — Encounter: Payer: Self-pay | Admitting: Nurse Practitioner

## 2022-07-12 ENCOUNTER — Emergency Department: Payer: Medicare Other

## 2022-07-12 ENCOUNTER — Inpatient Hospital Stay
Admission: EM | Admit: 2022-07-12 | Discharge: 2022-07-16 | DRG: 840 | Disposition: A | Discharge status: Home Health | Payer: Medicare Other | Attending: Internal Medicine | Admitting: Internal Medicine

## 2022-07-12 DIAGNOSIS — Z7189 Other specified counseling: Secondary | ICD-10-CM

## 2022-07-12 DIAGNOSIS — Z23 Encounter for immunization: Secondary | ICD-10-CM

## 2022-07-12 DIAGNOSIS — R531 Weakness: Secondary | ICD-10-CM | POA: Diagnosis not present

## 2022-07-12 DIAGNOSIS — J9611 Chronic respiratory failure with hypoxia: Secondary | ICD-10-CM | POA: Diagnosis not present

## 2022-07-12 DIAGNOSIS — Z8249 Family history of ischemic heart disease and other diseases of the circulatory system: Secondary | ICD-10-CM

## 2022-07-12 DIAGNOSIS — Z9889 Other specified postprocedural states: Secondary | ICD-10-CM

## 2022-07-12 DIAGNOSIS — R0602 Shortness of breath: Principal | ICD-10-CM

## 2022-07-12 DIAGNOSIS — C8590 Non-Hodgkin lymphoma, unspecified, unspecified site: Secondary | ICD-10-CM | POA: Diagnosis not present

## 2022-07-12 DIAGNOSIS — Z515 Encounter for palliative care: Secondary | ICD-10-CM

## 2022-07-12 DIAGNOSIS — E1122 Type 2 diabetes mellitus with diabetic chronic kidney disease: Secondary | ICD-10-CM | POA: Diagnosis not present

## 2022-07-12 DIAGNOSIS — Z820 Family history of epilepsy and other diseases of the nervous system: Secondary | ICD-10-CM | POA: Diagnosis not present

## 2022-07-12 DIAGNOSIS — E1165 Type 2 diabetes mellitus with hyperglycemia: Secondary | ICD-10-CM | POA: Diagnosis not present

## 2022-07-12 DIAGNOSIS — Z79899 Other long term (current) drug therapy: Secondary | ICD-10-CM | POA: Diagnosis not present

## 2022-07-12 DIAGNOSIS — R0902 Hypoxemia: Secondary | ICD-10-CM

## 2022-07-12 DIAGNOSIS — F17210 Nicotine dependence, cigarettes, uncomplicated: Secondary | ICD-10-CM | POA: Diagnosis present

## 2022-07-12 DIAGNOSIS — J449 Chronic obstructive pulmonary disease, unspecified: Secondary | ICD-10-CM | POA: Diagnosis not present

## 2022-07-12 DIAGNOSIS — W19XXXA Unspecified fall, initial encounter: Secondary | ICD-10-CM | POA: Diagnosis present

## 2022-07-12 DIAGNOSIS — J90 Pleural effusion, not elsewhere classified: Secondary | ICD-10-CM

## 2022-07-12 DIAGNOSIS — N189 Chronic kidney disease, unspecified: Secondary | ICD-10-CM | POA: Diagnosis not present

## 2022-07-12 DIAGNOSIS — N1831 Chronic kidney disease, stage 3a: Secondary | ICD-10-CM | POA: Diagnosis not present

## 2022-07-12 DIAGNOSIS — J9621 Acute and chronic respiratory failure with hypoxia: Secondary | ICD-10-CM

## 2022-07-12 DIAGNOSIS — I5031 Acute diastolic (congestive) heart failure: Secondary | ICD-10-CM | POA: Diagnosis not present

## 2022-07-12 DIAGNOSIS — J320 Chronic maxillary sinusitis: Secondary | ICD-10-CM | POA: Diagnosis not present

## 2022-07-12 DIAGNOSIS — I509 Heart failure, unspecified: Secondary | ICD-10-CM | POA: Diagnosis not present

## 2022-07-12 DIAGNOSIS — J918 Pleural effusion in other conditions classified elsewhere: Secondary | ICD-10-CM | POA: Diagnosis not present

## 2022-07-12 DIAGNOSIS — Z803 Family history of malignant neoplasm of breast: Secondary | ICD-10-CM

## 2022-07-12 DIAGNOSIS — Y92009 Unspecified place in unspecified non-institutional (private) residence as the place of occurrence of the external cause: Secondary | ICD-10-CM

## 2022-07-12 DIAGNOSIS — D638 Anemia in other chronic diseases classified elsewhere: Secondary | ICD-10-CM

## 2022-07-12 DIAGNOSIS — K111 Hypertrophy of salivary gland: Secondary | ICD-10-CM | POA: Diagnosis not present

## 2022-07-12 DIAGNOSIS — I5033 Acute on chronic diastolic (congestive) heart failure: Secondary | ICD-10-CM

## 2022-07-12 DIAGNOSIS — R6889 Other general symptoms and signs: Secondary | ICD-10-CM | POA: Diagnosis not present

## 2022-07-12 DIAGNOSIS — T380X5A Adverse effect of glucocorticoids and synthetic analogues, initial encounter: Secondary | ICD-10-CM | POA: Diagnosis not present

## 2022-07-12 DIAGNOSIS — J9601 Acute respiratory failure with hypoxia: Secondary | ICD-10-CM | POA: Diagnosis not present

## 2022-07-12 DIAGNOSIS — E039 Hypothyroidism, unspecified: Secondary | ICD-10-CM

## 2022-07-12 DIAGNOSIS — I6782 Cerebral ischemia: Secondary | ICD-10-CM | POA: Diagnosis not present

## 2022-07-12 DIAGNOSIS — D630 Anemia in neoplastic disease: Secondary | ICD-10-CM | POA: Diagnosis present

## 2022-07-12 DIAGNOSIS — D696 Thrombocytopenia, unspecified: Secondary | ICD-10-CM

## 2022-07-12 DIAGNOSIS — U099 Post covid-19 condition, unspecified: Secondary | ICD-10-CM | POA: Diagnosis not present

## 2022-07-12 DIAGNOSIS — R059 Cough, unspecified: Secondary | ICD-10-CM | POA: Diagnosis not present

## 2022-07-12 DIAGNOSIS — U071 COVID-19: Secondary | ICD-10-CM | POA: Diagnosis not present

## 2022-07-12 DIAGNOSIS — J811 Chronic pulmonary edema: Secondary | ICD-10-CM | POA: Diagnosis not present

## 2022-07-12 DIAGNOSIS — Z743 Need for continuous supervision: Secondary | ICD-10-CM | POA: Diagnosis not present

## 2022-07-12 DIAGNOSIS — J321 Chronic frontal sinusitis: Secondary | ICD-10-CM | POA: Diagnosis not present

## 2022-07-12 DIAGNOSIS — J9691 Respiratory failure, unspecified with hypoxia: Secondary | ICD-10-CM | POA: Diagnosis not present

## 2022-07-12 DIAGNOSIS — E119 Type 2 diabetes mellitus without complications: Secondary | ICD-10-CM

## 2022-07-12 DIAGNOSIS — B0229 Other postherpetic nervous system involvement: Secondary | ICD-10-CM | POA: Diagnosis present

## 2022-07-12 DIAGNOSIS — J9811 Atelectasis: Secondary | ICD-10-CM | POA: Diagnosis not present

## 2022-07-12 DIAGNOSIS — R609 Edema, unspecified: Secondary | ICD-10-CM | POA: Diagnosis not present

## 2022-07-12 LAB — CBC WITH DIFFERENTIAL/PLATELET
Abs Immature Granulocytes: 0.13 10*3/uL — ABNORMAL HIGH (ref 0.00–0.07)
Basophils Absolute: 0 10*3/uL (ref 0.0–0.1)
Basophils Relative: 0 %
Eosinophils Absolute: 0.1 10*3/uL (ref 0.0–0.5)
Eosinophils Relative: 1 %
HCT: 28.4 % — ABNORMAL LOW (ref 36.0–46.0)
Hemoglobin: 8 g/dL — ABNORMAL LOW (ref 12.0–15.0)
Immature Granulocytes: 2 %
Lymphocytes Relative: 83 %
Lymphs Abs: 7 10*3/uL — ABNORMAL HIGH (ref 0.7–4.0)
MCH: 27.6 pg (ref 26.0–34.0)
MCHC: 28.2 g/dL — ABNORMAL LOW (ref 30.0–36.0)
MCV: 97.9 fL (ref 80.0–100.0)
Monocytes Absolute: 0.6 10*3/uL (ref 0.1–1.0)
Monocytes Relative: 8 %
Neutro Abs: 0.5 10*3/uL — ABNORMAL LOW (ref 1.7–7.7)
Neutrophils Relative %: 6 %
Platelets: 98 10*3/uL — ABNORMAL LOW (ref 150–400)
RBC: 2.9 MIL/uL — ABNORMAL LOW (ref 3.87–5.11)
RDW: 19.9 % — ABNORMAL HIGH (ref 11.5–15.5)
Smear Review: NORMAL
WBC: 8.2 10*3/uL (ref 4.0–10.5)
nRBC: 2.3 % — ABNORMAL HIGH (ref 0.0–0.2)

## 2022-07-12 LAB — BASIC METABOLIC PANEL
Anion gap: 10 (ref 5–15)
BUN: 27 mg/dL — ABNORMAL HIGH (ref 8–23)
CO2: 29 mmol/L (ref 22–32)
Calcium: 8.7 mg/dL — ABNORMAL LOW (ref 8.9–10.3)
Chloride: 103 mmol/L (ref 98–111)
Creatinine, Ser: 1.25 mg/dL — ABNORMAL HIGH (ref 0.44–1.00)
GFR, Estimated: 45 mL/min — ABNORMAL LOW (ref >60)
Glucose, Bld: 154 mg/dL — ABNORMAL HIGH (ref 70–99)
Potassium: 4.9 mmol/L (ref 3.5–5.1)
Sodium: 142 mmol/L (ref 135–145)

## 2022-07-12 LAB — TROPONIN I (HIGH SENSITIVITY)
Troponin I (High Sensitivity): 28 ng/L — ABNORMAL HIGH (ref <18)
Troponin I (High Sensitivity): 23 ng/L — ABNORMAL HIGH (ref <18)

## 2022-07-12 LAB — SARS CORONAVIRUS 2 BY RT PCR: SARS Coronavirus 2 by RT PCR: POSITIVE — AB

## 2022-07-12 LAB — BRAIN NATRIURETIC PEPTIDE: B Natriuretic Peptide: 320.6 pg/mL — ABNORMAL HIGH (ref 0.0–100.0)

## 2022-07-12 MED ORDER — ALBUTEROL SULFATE (2.5 MG/3ML) 0.083% IN NEBU: 3.0000 mL | INHALATION_SOLUTION | RESPIRATORY_TRACT | Status: DC | PRN

## 2022-07-12 MED ORDER — ACETAMINOPHEN 325 MG PO TABS
650.0000 mg | ORAL_TABLET | Freq: Four times a day (QID) | ORAL | Status: DC | PRN
  Filled 2022-07-12 (×3): qty 2

## 2022-07-12 MED ORDER — ENOXAPARIN SODIUM 40 MG/0.4ML IJ SOSY
40.0000 mg | PREFILLED_SYRINGE | INTRAMUSCULAR | Status: DC
  Administered 2022-07-12 – 2022-07-15 (×4): 40 mg via SUBCUTANEOUS
  Filled 2022-07-12 (×4): qty 0.4

## 2022-07-12 MED ORDER — FUROSEMIDE 10 MG/ML IJ SOLN
20.0000 mg | Freq: Two times a day (BID) | INTRAMUSCULAR | Status: DC
  Administered 2022-07-12 – 2022-07-13 (×3): 20 mg via INTRAVENOUS
  Filled 2022-07-12 (×3): qty 2

## 2022-07-12 MED ORDER — TRAZODONE HCL 50 MG PO TABS
25.0000 mg | ORAL_TABLET | Freq: Every evening | ORAL | Status: DC | PRN
  Administered 2022-07-14: 25 mg via ORAL
  Filled 2022-07-12 (×2): qty 1

## 2022-07-12 MED ORDER — TRIAMCINOLONE ACETONIDE 0.1 % EX CREA
TOPICAL_CREAM | Freq: Two times a day (BID) | CUTANEOUS | Status: DC
  Administered 2022-07-12 – 2022-07-14 (×2): 1 via TOPICAL
  Filled 2022-07-12: qty 15

## 2022-07-12 MED ORDER — ACETAMINOPHEN 650 MG RE SUPP: 650.0000 mg | Freq: Four times a day (QID) | RECTAL | Status: DC | PRN

## 2022-07-12 MED ORDER — SODIUM CHLORIDE 0.9 % IV SOLN: INTRAVENOUS | Status: DC

## 2022-07-12 MED ORDER — IPRATROPIUM-ALBUTEROL 0.5-2.5 (3) MG/3ML IN SOLN: 3.0000 mL | RESPIRATORY_TRACT | Status: DC | PRN

## 2022-07-12 MED ORDER — MAGNESIUM HYDROXIDE 400 MG/5ML PO SUSP: 30.0000 mL | Freq: Every day | ORAL | Status: DC | PRN

## 2022-07-12 MED ORDER — HYDROCOD POLI-CHLORPHE POLI ER 10-8 MG/5ML PO SUER
5.0000 mL | Freq: Two times a day (BID) | ORAL | Status: DC | PRN
  Administered 2022-07-14: 5 mL via ORAL
  Filled 2022-07-12: qty 5

## 2022-07-12 MED ORDER — IOHEXOL 350 MG/ML SOLN
60.0000 mL | Freq: Once | INTRAVENOUS | Status: AC | PRN
  Administered 2022-07-12: 60 mL via INTRAVENOUS

## 2022-07-12 NOTE — ED Notes (Signed)
XRAY  POWERSHARE  WITH  [facility] 

## 2022-07-12 NOTE — ED Provider Notes (Signed)
[facility] Provider Note    Event Date/Time   First MD Initiated Contact with Patient 07/12/22 1341     (approximate)   History   SOB   HPI Natasha [name] is a 76 y.o. female who presents to the emergency department today because of concerns for shortness of breath.  Patient states that has been on and off although came back again this morning more severe.  She states she has also felt weak and had a fall yesterday and hit her head.  She does not think she lost consciousness.  She denies any pain in her chest.  States that she has required fluid to be drawn from her chest in the past. Per chart review patient had tested positive for COVID earlier this month.     Physical Exam   Triage Vital Signs: ED Triage Vitals  Enc Vitals Group     BP 07/12/22 1329 110/63     Pulse Rate 07/12/22 1329 90     Resp 07/12/22 1329 19     Temp 07/12/22 1329 98.8 F (37.1 C)     Temp Source 07/12/22 1329 Oral     SpO2 07/12/22 1329 97 %     Weight 07/12/22 1330 154 lb 5.2 oz (70 kg)     Height 07/12/22 1330 '5\' 2"'$  (1.575 m)     Head Circumference --      Peak Flow --      Pain Score 07/12/22 1330 0     Pain Loc --      Pain Edu? --      Excl. in [place]? --     Most recent vital signs: Vitals:   07/12/22 1329  BP: 110/63  Pulse: 90  Resp: 19  Temp: 98.8 F (37.1 C)  SpO2: 97%    General: Awake, alert, oriented.  CV:  Good peripheral perfusion. Regular rate and rhythm. Resp:  Increased work of breathing. Decreased breath sounds. Abd:  No distention.     ED Results / Procedures / Treatments   Labs (all labs ordered are listed, but only abnormal results are displayed) Labs Reviewed  CBC WITH DIFFERENTIAL/PLATELET - Abnormal; Notable for the following components:      Result Value   RBC 2.90 (*)    Hemoglobin 8.0 (*)    HCT 28.4 (*)    MCHC 28.2 (*)    RDW 19.9 (*)    Platelets 98 (*)    nRBC 2.3 (*)    Neutro Abs 0.5 (*)    Lymphs Abs 7.0 (*)     Abs Immature Granulocytes 0.13 (*)    All other components within normal limits  BASIC METABOLIC PANEL - Abnormal; Notable for the following components:   Glucose, Bld 154 (*)    BUN 27 (*)    Creatinine, Ser 1.25 (*)    Calcium 8.7 (*)    GFR, Estimated 45 (*)    All other components within normal limits  TROPONIN I (HIGH SENSITIVITY) - Abnormal; Notable for the following components:   Troponin I (High Sensitivity) 28 (*)    All other components within normal limits  SARS CORONAVIRUS 2 BY RT PCR     EKG  I, Nance Pear, attending physician, personally viewed and interpreted this EKG  EKG Time: 1334 Rate: 90 Rhythm: sinus rhythm Axis: normal Intervals: qtc 425 QRS: narrow, low voltage ST changes: no st elevation Impression: abnormal ekg   RADIOLOGY I independently interpreted and visualized the CT  head. My interpretation: No large bleed Radiology interpretation:  IMPRESSION:  1. No acute intracranial findings.  2. Marked enlargement of the bilateral parotid glands, new from  2019. 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  3. Chronic microvascular ischemic change and cerebral volume loss.  4. Right maxillary and right frontal sinus disease. Partial  bilateral mastoid effusions.    I independently interpreted and visualized the CXR. My interpretation: Bilateral pleural effusions Radiology interpretation:  IMPRESSION:  Central pulmonary vessels are more prominent suggesting CHF.  Moderate to large bilateral pleural effusions, more so on the right  side with significant interval increase.      PROCEDURES:  Critical Care performed: No  Procedures   MEDICATIONS ORDERED IN ED: Medications - No data to display   IMPRESSION / MDM / [place] / ED COURSE  I reviewed the triage vital signs and the nursing notes.                              Differential diagnosis  includes, but is not limited to, pneumonia, pneumothorax, effusion, COVID.  Patient's presentation is most consistent with acute presentation with potential threat to life or bodily function.  Patient presented to the emergency department today because of concerns for shortness of breath.  EMS did find patient to be hypoxic on room air.  Was placed on nasal cannula and stated that she did feel better.  Diminished breath sounds diffusely throughout her lungs.  Chest x-ray is concerning for bilateral pleural effusions.  Patient did have COVID earlier this month.  While I do think hypoxia could be explained by the pleural effusions given concern for possible pulmonary embolism a CT angio will be ordered.    FINAL CLINICAL IMPRESSION(S) / ED DIAGNOSES   Final diagnoses:  SOB (shortness of breath)  Hypoxia  Pleural effusion     Rx / DC Orders   ED Discharge Orders     None        Note:  This document was prepared using Dragon voice recognition software and may include unintentional dictation errors.    Nance Pear, MD 07/12/22 [phone]

## 2022-07-12 NOTE — Assessment & Plan Note (Addendum)
-   This likely secondary to her lymphoma and differential diagnosis would include new onset acute CHF possibly diastolic. - The patient be admitted to a medical telemetry bed. - We will obtain an IR consult for thoracentesis. - O2 protocol will be followed. - We will check her BN P. - 2D echo be obtained. - With elevated BNP we will gently diuresed with IV Lasix given borderline blood pressure.

## 2022-07-12 NOTE — Assessment & Plan Note (Signed)
-   O2 protocol will be followed. - Management otherwise as above. 

## 2022-07-12 NOTE — Assessment & Plan Note (Signed)
-   The patient will place on supplement coverage with NovoLog.

## 2022-07-12 NOTE — ED Notes (Signed)
[facility]  CALLED  PER  DR  ROBINSON  MD 

## 2022-07-12 NOTE — H&P (Signed)
[place]   PATIENT NAME: Natasha [name]    MR#:  [id number]  DATE OF BIRTH:  [birth year]  DATE OF ADMISSION:  07/12/2022  PRIMARY CARE PHYSICIAN: Jon Billings, NP   Patient is coming from: Home  REQUESTING/REFERRING PHYSICIAN: Merlyn Lot, MD  CHIEF COMPLAINT:   Chief Complaint  Patient presents with   Shortness of Breath    Home health called for hypoxia , sao2 per home health 80's, copd patient , she fell yesterday no loc, bruise to right face     HISTORY OF PRESENT ILLNESS:  Natasha [name] is a 76 y.o. Caucasian female with medical history significant for type 2 diabetes mellitus, COPD, postherpetic neuralgia, recurrent pleural effusion and newly diagnosed gastric lymphoma and well as anemia and thrombocytopenia, who presented to the emergency room with acute onset of worsening dyspnea which has been intermittent lately and since this morning has been constant.  She admits to generalized weakness and fatigue and had a fall yesterday when she slipped out of the couch hitting her head.  She has been having orthopnea and paroxysmal nocturnal dyspnea as well as dyspnea on exertion, dry cough and occasional wheezing.  She continues to smoke about a pack of cigarettes per day.  No loss of consciousness after her fall and she denied paresthesias or focal muscle weakness.  No chest pain or dyspnea or palpitations.  No fever or chills.  Earlier this month she had positive COVID on outpatient basis and was given p.o. Paxlovid.  No dysuria, oliguria or hematuria or flank pain.  ED Course: Upon presentation to the emergency room, respiratory rate was 23 and pulse currently 98% on 3 L O2 by nasal cannula with otherwise normal vital signs.  The patient has no baseline home oxygen requirement.  BMP revealed a BUN of 27 and creatinine 1.27 with blood glucose of 154 with calcium 8.7.  High sensitive troponin I was 28 and later 23.  CBC showed hemoglobin of 8 and hematocrit 20.4 with  platelets of 98 all close to baseline. EKG as reviewed by me : EKG showed normal sinus rhythm with a rate of 90 with PACs and low voltage QRS. Imaging: Portable chest ray showed central pulmonary vessels were prominent suggesting CHF and moderate to large bilateral pleural effusions more on the right than the left with significant interval increase.  The patient will be admitted to a medical telemetry bed for further evaluation and management. 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   Diabetes mellitus without complication ([place])    Pleural effusion    [facility] 875m,  2 weeks ago   Vaginal delivery    x 5    PAST SURGICAL HISTORY:   Past Surgical History:  Procedure Laterality Date   TONSILLECTOMY Bilateral    as a child    SOCIAL HISTORY:   Social History   Tobacco Use   Smoking status: Every Day    Packs/day: 2.00    Years: 55.00    Total pack years: 110.00    Types: Cigarettes    Start date: 07/07/1961   Smokeless tobacco: Never   Tobacco comments:    1/2 pack she states but family says 2 PPD  Substance Use Topics   Alcohol use: No    FAMILY HISTORY:   Family History  Problem Relation Age of  Onset   Breast cancer Mother    Alzheimer's disease Father    Lung cancer Father        lung   Varicose Veins Brother    Heart attack Brother     DRUG ALLERGIES:  No Known Allergies  REVIEW OF SYSTEMS:   ROS As per history of present illness. All pertinent systems were reviewed above. Constitutional, HEENT, cardiovascular, respiratory, GI, GU, musculoskeletal, neuro, psychiatric, endocrine, integumentary and hematologic systems were reviewed and are otherwise negative/unremarkable except for positive findings mentioned above in the HPI.   MEDICATIONS AT HOME:   Prior to Admission medications   Medication Sig Start Date End Date Taking? Authorizing Provider  albuterol  (VENTOLIN HFA) 108 (90 Base) MCG/ACT inhaler TAKE 2 PUFFS BY MOUTH EVERY 6 HOURS AS NEEDED FOR WHEEZE OR SHORTNESS OF BREATH 06/17/22  Yes Sreenath, Sudheer B, MD  betamethasone valerate ointment (VALISONE) 0.1 % Apply topically 2 (two) times daily. 07/07/22  Yes [provider]  chlorpheniramine-HYDROcodone ([place]) 10-8 MG/5ML  06/17/22  Yes [provider]  Ipratropium-Albuterol (COMBIVENT RESPIMAT) 20-100 MCG/ACT AERS respimat Inhale 1 puff into the lungs every 6 (six) hours.   Yes [provider]  nirmatrelvir/ritonavir EUA, renal dosing, (PAXLOVID) 10 x 150 MG & 10 x '100MG'$  TABS  06/30/22  Yes [provider]      VITAL SIGNS:  Blood pressure (!) 101/59, pulse 86, temperature 98.7 F (37.1 C), temperature source Oral, resp. rate 13, height '5\' 2"'$  (1.575 m), weight 70 kg, SpO2 96 %.  PHYSICAL EXAMINATION:  Physical Exam  GENERAL:  76 y.o.-year-old Caucasian female patient lying in the bed with mild respiratory distress with conversational dyspnea.   EYES: Pupils equal, round, reactive to light and accommodation. No scleral icterus. Extraocular muscles intact.  HEENT: Head atraumatic, normocephalic. Oropharynx and nasopharynx clear.  NECK:  Supple, no jugular venous distention. No thyroid enlargement, no tenderness.  LUNGS: Diminished bibasilar breath sounds more on the right than the left.. No use of accessory muscles of respiration.  CARDIOVASCULAR: Regular rate and rhythm, S1, S2 normal. No murmurs, rubs, or gallops.  ABDOMEN: Soft, nondistended, nontender. Bowel sounds present. No organomegaly or mass.  EXTREMITIES: 1-2+ bilateral lower extremity pitting edema, with no cyanosis, or clubbing.  NEUROLOGIC: Cranial nerves II through XII are intact. Muscle strength 5/5 in all extremities. Sensation intact. Gait not checked.  PSYCHIATRIC: The patient is alert and oriented x 3.  Normal affect and good eye contact. SKIN: No obvious rash, lesion, or ulcer.    LABORATORY PANEL:   CBC Recent Labs  Lab 07/12/22 1335  WBC 8.2  HGB 8.0*  HCT 28.4*  PLT 98*   ------------------------------------------------------------------------------------------------------------------  Chemistries  Recent Labs  Lab 07/12/22 1335  NA 142  K 4.9  CL 103  CO2 29  GLUCOSE 154*  BUN 27*  CREATININE 1.25*  CALCIUM 8.7*   ------------------------------------------------------------------------------------------------------------------  Cardiac Enzymes No results for input(s): "TROPONINI" in the last 168 hours. ------------------------------------------------------------------------------------------------------------------  RADIOLOGY:  CT Angio Chest PE W and/or Wo Contrast  Result Date: 07/12/2022 CLINICAL DATA:  Severe shortness of breath, weakness, fell yesterday, history of lymphoma EXAM: CT ANGIOGRAPHY CHEST WITH CONTRAST TECHNIQUE: Multidetector CT imaging of the chest was performed using the standard protocol during bolus administration of intravenous contrast. Multiplanar CT image reconstructions and MIPs were obtained to evaluate the vascular anatomy. RADIATION DOSE REDUCTION: This exam was performed according to the departmental dose-optimization program which includes automated exposure control, adjustment of the mA and/or kV according  to patient size and/or use of iterative reconstruction technique. CONTRAST:  72m OMNIPAQUE IOHEXOL 350 MG/ML SOLN COMPARISON:  06/16/2022, 04/16/2022 FINDINGS: Cardiovascular: This is a technically adequate evaluation of the pulmonary vasculature. No filling defects or pulmonary emboli. The heart is unremarkable without pericardial effusion. No evidence of thoracic aortic aneurysm or dissection. Mediastinum/Nodes: The extensive mediastinal, hilar, and axillary adenopathy seen on prior exam again identified, not appreciably changed. Index lymph nodes are as follows: Right axilla, image 24/4, 3.1 x 2.0 cm.  Precarinal, image 42/4, 1.7 x 2.1 cm. Stable appearance of the thyroid, trachea, and esophagus. Lungs/Pleura: There are persistent bilateral pleural effusions, right greater than left, increased since prior study. Dense consolidation within the lung bases, greatest in the lower lobes, most compatible with compressive atelectasis. Partially calcified right upper lobe nodule again identified reference image 51/4 measuring up to 2 cm. No pneumothorax. Central airways are patent. Upper Abdomen: Partial visualization of marked splenomegaly and extensive upper abdominal lymphadenopathy. Musculoskeletal: There is diffuse body wall edema. No acute or destructive bony lesions. Reconstructed images demonstrate no additional findings. Review of the MIP images confirms the above findings. IMPRESSION: 1. No evidence of pulmonary embolus. 2. Worsening large bilateral pleural effusions with underlying lower lobe atelectasis. 3. Diffuse anasarca. 4. Extensive thoracic and abdominal lymphadenopathy, as well as marked splenomegaly, compatible with patient's known history of lymphoma. Electronically Signed   By: MRanda NgoM.D.   On: 07/12/2022 17:05   CT Head Wo Contrast  Result Date: 07/12/2022 CLINICAL DATA:  Fall.  History of lymphoma. EXAM: CT HEAD WITHOUT CONTRAST TECHNIQUE: Contiguous axial images were obtained from the base of the skull through the vertex without intravenous contrast. RADIATION DOSE REDUCTION: This exam was performed according to the departmental dose-optimization program which includes automated exposure control, adjustment of the mA and/or kV according to patient size and/or use of iterative reconstruction technique. COMPARISON:  03/19/2020 head CT, 09/29/2018 PET-CT FINDINGS: Brain: No evidence of acute infarction, hemorrhage, hydrocephalus, extra-axial collection or mass lesion/mass effect. Patchy low-density changes within the periventricular and subcortical white matter compatible with chronic  microvascular ischemic change. Mild diffuse cerebral volume loss. Vascular: No hyperdense vessel or unexpected calcification. Skull: Normal. Negative for fracture or focal lesion. Sinuses/Orbits: Mucosal thickening of the right maxillary and right frontal sinuses. Partial bilateral mastoid effusions. Other: Marked enlargement of the bilateral parotid glands is new from 2019. IMPRESSION: 1. No acute intracranial findings. 2. Marked enlargement of the bilateral parotid glands, new from 2019. 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 3. Chronic microvascular ischemic change and cerebral volume loss. 4. Right maxillary and right frontal sinus disease. Partial bilateral mastoid effusions. Electronically Signed   By: NDavina PokeD.O.   On: 07/12/2022 15:36   DG Chest Portable 1 View  Result Date: 07/12/2022 CLINICAL DATA:  Shortness of breath, hypoxia EXAM: PORTABLE CHEST 1 VIEW COMPARISON:  Previous studies including the examination of 06/17/2022 FINDINGS: Transverse diameter of heart is increased. Central pulmonary vessels are more prominent. Moderate to large bilateral pleural effusions are seen, more so on the right side with significant interval increase. There is no pneumothorax. Evaluation of mid and lower lung fields for focal infiltrates is limited by the effusions. IMPRESSION: Central pulmonary vessels are more prominent suggesting CHF. Moderate to large bilateral pleural effusions, more so on the right side with significant interval increase. Electronically Signed   By: PElmer PickerM.D.   On: 07/12/2022 15:05  IMPRESSION AND PLAN:  Assessment and Plan: * Bilateral pleural effusion - This likely secondary to her lymphoma and differential diagnosis would include new onset acute CHF possibly diastolic. - The patient be admitted to a medical telemetry bed. - We will obtain an IR consult for thoracentesis. - O2  protocol will be followed. - We will check her BN P. - 2D echo be obtained. - With elevated BNP we will gently diuresed with IV Lasix given borderline blood pressure.  Acute respiratory failure with hypoxia (HCC) - O2 protocol will be followed. - Management otherwise as above.  Type 2 diabetes mellitus with chronic kidney disease - The patient will place on supplement coverage with NovoLog.  Chronic obstructive pulmonary disease (COPD) ([place]) - The patient will be placed on her inhalers and as needed DuoNebs.   DVT prophylaxis: Lovenox.  Advanced Care Planning:  Code Status: full code.  Family Communication:  The plan of care was discussed in details with the patient (and family). I answered all questions. The patient agreed to proceed with the above mentioned plan. Further management will depend upon hospital course. Disposition Plan: Back to previous home environment Consults called: none.  All the records are reviewed and case discussed with ED provider.  Status is: Inpatient    At the time of the admission, it appears that the appropriate admission status for this patient is inpatient.  This is judged to be reasonable and necessary in order to provide the required intensity of service to ensure the patient's safety given the presenting symptoms, physical exam findings and initial radiographic and laboratory data in the context of comorbid conditions.  The patient requires inpatient status due to high intensity of service, high risk of further deterioration and high frequency of surveillance required.  I certify that at the time of admission, it is my clinical judgment that the patient will require inpatient hospital care extending more than 2 midnights.                            Dispo: The patient is from: Home              Anticipated d/c is to: Home              Patient currently is not medically stable to d/c.              Difficult to place patient: No  Christel Mormon M.D on  07/12/2022 at 8:15 PM  [facility]   From 7 PM-7 AM, contact night-coverage [email or web address]  CC: Primary care physician; Jon Billings, NP

## 2022-07-12 NOTE — Assessment & Plan Note (Signed)
-   The patient will be placed on her inhalers and as needed DuoNebs.

## 2022-07-12 NOTE — ED Triage Notes (Signed)
Home health called for hypoxia , sao2 per home health 80's, copd patient , she fell yesterday no loc, bruise to right face

## 2022-07-12 NOTE — ED Provider Notes (Signed)
CTA without evidence of PE.  Discussed my recommendation for admission given her acute hypoxia.  Family requesting transfer to [facility].  [facility] is at capacity has declined acceptance.  They then asked for transfer to [facility] which is on diversion.  They understand need for admission.  Will consult hospitalist for admission here.   Merlyn Lot, MD 07/12/22 1911

## 2022-07-13 ENCOUNTER — Inpatient Hospital Stay: Payer: Medicare Other

## 2022-07-13 ENCOUNTER — Encounter: Payer: Self-pay | Admitting: Nurse Practitioner

## 2022-07-13 ENCOUNTER — Inpatient Hospital Stay (HOSPITAL_COMMUNITY)
Admit: 2022-07-13 | Discharge: 2022-07-13 | Disposition: A | Discharge status: Home / Self Care | Payer: Medicare Other | Attending: Family Medicine | Admitting: Family Medicine

## 2022-07-13 DIAGNOSIS — I5031 Acute diastolic (congestive) heart failure: Secondary | ICD-10-CM | POA: Diagnosis not present

## 2022-07-13 DIAGNOSIS — J90 Pleural effusion, not elsewhere classified: Secondary | ICD-10-CM | POA: Diagnosis not present

## 2022-07-13 LAB — CBC
HCT: 24.7 % — ABNORMAL LOW (ref 36.0–46.0)
Hemoglobin: 7 g/dL — ABNORMAL LOW (ref 12.0–15.0)
MCH: 27.6 pg (ref 26.0–34.0)
MCHC: 28.3 g/dL — ABNORMAL LOW (ref 30.0–36.0)
MCV: 97.2 fL (ref 80.0–100.0)
Platelets: 83 10*3/uL — ABNORMAL LOW (ref 150–400)
RBC: 2.54 MIL/uL — ABNORMAL LOW (ref 3.87–5.11)
RDW: 19.9 % — ABNORMAL HIGH (ref 11.5–15.5)
WBC: 6.6 10*3/uL (ref 4.0–10.5)
nRBC: 2.3 % — ABNORMAL HIGH (ref 0.0–0.2)

## 2022-07-13 LAB — ECHOCARDIOGRAM COMPLETE
AR max vel: 2.06 cm2
AV Area VTI: 2.57 cm2
AV Area mean vel: 1.98 cm2
AV Mean grad: 8 mmHg
AV Peak grad: 14.3 mmHg
Ao pk vel: 1.89 m/s
Area-P 1/2: 5.75 cm2
Height: 62 in
S' Lateral: 2.4 cm
Weight: 2723.12 oz

## 2022-07-13 LAB — BODY FLUID CELL COUNT WITH DIFFERENTIAL
Eos, Fluid: 0 %
Lymphs, Fluid: 95 %
Monocyte-Macrophage-Serous Fluid: 5 %
Neutrophil Count, Fluid: 0 %
Total Nucleated Cell Count, Fluid: 3205 cu mm

## 2022-07-13 LAB — PROCALCITONIN: Procalcitonin: 0.29 ng/mL

## 2022-07-13 LAB — VITAMIN B12: Vitamin B-12: 1148 pg/mL — ABNORMAL HIGH (ref 180–914)

## 2022-07-13 LAB — BASIC METABOLIC PANEL
Anion gap: 7 (ref 5–15)
BUN: 27 mg/dL — ABNORMAL HIGH (ref 8–23)
CO2: 30 mmol/L (ref 22–32)
Calcium: 7.9 mg/dL — ABNORMAL LOW (ref 8.9–10.3)
Chloride: 107 mmol/L (ref 98–111)
Creatinine, Ser: 1.25 mg/dL — ABNORMAL HIGH (ref 0.44–1.00)
GFR, Estimated: 45 mL/min — ABNORMAL LOW (ref >60)
Glucose, Bld: 128 mg/dL — ABNORMAL HIGH (ref 70–99)
Potassium: 4 mmol/L (ref 3.5–5.1)
Sodium: 144 mmol/L (ref 135–145)

## 2022-07-13 LAB — IRON AND TIBC
Iron: 96 ug/dL (ref 28–170)
Saturation Ratios: 30 % (ref 10.4–31.8)
TIBC: 325 ug/dL (ref 250–450)
UIBC: 229 ug/dL

## 2022-07-13 LAB — TSH: TSH: 4.684 u[IU]/mL — ABNORMAL HIGH (ref 0.350–4.500)

## 2022-07-13 LAB — VITAMIN D 25 HYDROXY (VIT D DEFICIENCY, FRACTURES): Vit D, 25-Hydroxy: 21.3 ng/mL — ABNORMAL LOW (ref 30–100)

## 2022-07-13 LAB — LACTATE DEHYDROGENASE, PLEURAL OR PERITONEAL FLUID: LD, Fluid: 156 U/L — ABNORMAL HIGH (ref 3–23)

## 2022-07-13 LAB — FERRITIN: Ferritin: 64 ng/mL (ref 11–307)

## 2022-07-13 MED ORDER — SENNOSIDES-DOCUSATE SODIUM 8.6-50 MG PO TABS: 1.0000 | ORAL_TABLET | Freq: Every evening | ORAL | Status: DC | PRN

## 2022-07-13 MED ORDER — IPRATROPIUM-ALBUTEROL 20-100 MCG/ACT IN AERS
1.0000 | INHALATION_SPRAY | Freq: Four times a day (QID) | RESPIRATORY_TRACT | Status: DC
  Administered 2022-07-13 – 2022-07-15 (×11): 1 via RESPIRATORY_TRACT
  Filled 2022-07-13: qty 4

## 2022-07-13 MED ORDER — ORAL CARE MOUTH RINSE: 15.0000 mL | OROMUCOSAL | Status: DC | PRN

## 2022-07-13 MED ORDER — VITAMIN D 25 MCG (1000 UNIT) PO TABS
1000.0000 [IU] | ORAL_TABLET | Freq: Every day | ORAL | Status: DC
  Administered 2022-07-14 – 2022-07-16 (×3): 1000 [IU] via ORAL
  Filled 2022-07-13 (×3): qty 1

## 2022-07-13 MED ORDER — GUAIFENESIN 100 MG/5ML PO LIQD
5.0000 mL | ORAL | Status: DC | PRN
  Administered 2022-07-13: 5 mL via ORAL
  Filled 2022-07-13 (×2): qty 10

## 2022-07-13 MED ORDER — NICOTINE 14 MG/24HR TD PT24: 14.0000 mg | MEDICATED_PATCH | Freq: Every day | TRANSDERMAL | Status: DC | PRN

## 2022-07-13 NOTE — Progress Notes (Signed)
Patient's b/p remains soft, asymptomatic. MD made aware.

## 2022-07-13 NOTE — Evaluation (Signed)
Physical Therapy Evaluation Patient Details Name: Natasha [name] MRN: [id number] DOB: October 25, 1946 Today's Date: 07/13/2022  History of Present Illness  Per MD: 76 y.o. Caucasian female with medical history significant for type 2 diabetes mellitus, COPD, postherpetic neuralgia, tobacco use, recurrent pleural effusion and newly diagnosed gastric lymphoma and well as anemia and thrombocytopenia, who presented to the emergency room with acute onset of worsening dyspnea.    Clinical Impression  Pt supine having echo performed while therapist gatherd subjective and background/history information for pt.  Pt current lives at home with daughter, granddaughter, and several roommates.  Pt has had 3-4 falls in the last 6 months and is needing an AD to assist her with ambulation.  Pt denies the need for AD, but per the request of members of the household, pt needs a rollator.  Pt moves well with slow, sometimes sporadic movements due to lack of safety concern.  Pt has narrow base of support when ambulating in room and limited by nasal cannula.  Pt has good O2 saturation in sitting and supine, but decreased to 84-88% when upright in standing and ambulating.  Pt educated on pursed lip breathing exercise and pt was able to achieve once back in supine position.  Pt to benefit from oxygen use at this time and may be recommended at home as part of discharge.  Pt left on [facility] with call bell within reach and nursing notified.  Current discharge plans to home with HHPT is appropriate at this time.  Pt will continue to benefit from skilled therapy in order to address deficits listed below.        Recommendations for follow up therapy are one component of a multi-disciplinary discharge planning process, led by the attending physician.  Recommendations may be updated based on patient status, additional functional criteria and insurance authorization.  Follow Up Recommendations Home health PT      Assistance Recommended at  Discharge PRN  Patient can return home with the following  A little help with walking and/or transfers;A little help with bathing/dressing/bathroom;Assistance with cooking/housework;Help with stairs or ramp for entrance    Equipment Recommendations Rollator (4 wheels) (Family requesting a rollator.)  Recommendations for Other Services       Functional Status Assessment Patient has had a recent decline in their functional status and demonstrates the ability to make significant improvements in function in a reasonable and predictable amount of time.     Precautions / Restrictions Precautions Precautions: Fall      Mobility  Bed Mobility Overal bed mobility: Needs Assistance Bed Mobility: Supine to Sit, Sit to Supine     Supine to sit: Supervision Sit to supine: Supervision   General bed mobility comments: increased time and effort to complete.    Transfers Overall transfer level: Needs assistance Equipment used: None Transfers: Sit to/from Stand Sit to Stand: Supervision           General transfer comment: Able to perform but slow to do so and slightly unsteady    Ambulation/Gait Ambulation/Gait assistance: Min guard Gait Distance (Feet): 10 Feet Assistive device: None Gait Pattern/deviations: Step-through pattern, Trunk flexed, Narrow base of support Gait velocity: decreased     General Gait Details: Pt able to ambulate with NBOS, and demonstrating increased risk of falling due to imbalance with walking.  Stairs            Wheelchair Mobility    Modified Rankin (Stroke Patients Only)       Balance Overall balance assessment:  Mild deficits observed, not formally tested, History of Falls                                           Pertinent Vitals/Pain Pain Assessment Pain Assessment: No/denies pain    Home Living Family/patient expects to be discharged to:: Private residence Living Arrangements: Other  relatives;Non-relatives/Friends Available Help at Discharge: Family;Available 24 hours/day Type of Home: House Home Access: Stairs to enter Entrance Stairs-Rails: None Entrance Stairs-Number of Steps: 2   Home Layout: One level Home Equipment: Conservation officer, nature (2 wheels);Cane - single point Additional Comments: Roommates endorse fall 2 days ago; Endorses 3 falls in the past 6 months.    Prior Function Prior Level of Function : Needs assist       Physical Assist : ADLs (physical)   ADLs (physical): Grooming;Bathing;Dressing;Toileting         Hand Dominance   Dominant Hand: Left    Extremity/Trunk Assessment   Upper Extremity Assessment Upper Extremity Assessment: Generalized weakness    Lower Extremity Assessment Lower Extremity Assessment: Generalized weakness       Communication      Cognition Arousal/Alertness: Awake/alert Behavior During Therapy: WFL for tasks assessed/performed Overall Cognitive Status: Within Functional Limits for tasks assessed                                          General Comments      Exercises     Assessment/Plan    PT Assessment Patient needs continued PT services  PT Problem List Decreased strength;Decreased activity tolerance;Decreased balance;Decreased mobility;Decreased knowledge of use of DME;Decreased safety awareness       PT Treatment Interventions DME instruction;Gait training;Stair training;Functional mobility training;Therapeutic activities;Therapeutic exercise;Balance training;Neuromuscular re-education;Patient/family education    PT Goals (Current goals can be found in the Care Plan section)  Acute Rehab PT Goals Patient Stated Goal: to go home. PT Goal Formulation: With patient Time For Goal Achievement: 07/27/22 Potential to Achieve Goals: Good    Frequency Min 2X/week     Co-evaluation               AM-PAC PT "6 Clicks" Mobility  Outcome Measure Help needed turning from your  back to your side while in a flat bed without using bedrails?: A Little Help needed moving from lying on your back to sitting on the side of a flat bed without using bedrails?: A Little Help needed moving to and from a bed to a chair (including a wheelchair)?: A Little Help needed standing up from a chair using your arms (e.g., wheelchair or bedside chair)?: A Little Help needed to walk in hospital room?: A Little Help needed climbing 3-5 steps with a railing? : A Lot 6 Click Score: 17    End of Session Equipment Utilized During Treatment: Gait belt Activity Tolerance: Patient tolerated treatment well;Other (comment) (Distance limited by O2 line.) Patient left: with call bell/phone within reach;with family/visitor present (on [facility] with nursing notified.) Nurse Communication: Mobility status PT Visit Diagnosis: Unsteadiness on feet (R26.81);Other abnormalities of gait and mobility (R26.89);Repeated falls (R29.6);Muscle weakness (generalized) (M62.81);History of falling (Z91.81);Difficulty in walking, not elsewhere classified (R26.2)    Time: 1326-1406 PT Time Calculation (min) (ACUTE ONLY): 40 min   Charges:   PT Evaluation $PT Eval Moderate Complexity: 1  Mod PT Treatments $Therapeutic Activity: 8-22 mins        Gwenlyn Saran, PT, DPT 07/13/22, 3:08 PM

## 2022-07-13 NOTE — Progress Notes (Signed)
Ultrasound at bedside for thoracentesis

## 2022-07-13 NOTE — TOC Initial Note (Signed)
Transition of Care [facility]) - Initial/Assessment Note    Patient Details  Name: Natasha [name] MRN: [id number] Date of Birth: Aug 29, 1946  Transition of [facility]) CM/SW Contact:    Laurena Slimmer, RN Phone Number: 07/13/2022, 1:47 PM  Clinical Narrative:                  Transition of Care [facility]) Screening Note   Patient Details  Name: Natasha [name] Date of Birth: Feb 29, 1946   Transition of Care [facility]) CM/SW Contact:    Laurena Slimmer, RN Phone Number: 07/13/2022, 1:47 PM    Transition of Care Department [facility]) has reviewed patient and no TOC needs have been identified at this time. We will continue to monitor patient advancement through interdisciplinary progression rounds. If new patient transition needs arise, please place a TOC consult.          Patient Goals and CMS Choice        Expected Discharge Plan and Services                                                Prior Living Arrangements/Services                       Activities of Daily Living      Permission Sought/Granted                  Emotional Assessment              Admission diagnosis:  SOB (shortness of breath) [R06.02] Pleural effusion [J90] Hypoxia [R09.02] Bilateral pleural effusion [J90] Patient Active Problem List   Diagnosis Date Noted   Bilateral pleural effusion 07/12/2022   Type 2 diabetes mellitus with chronic kidney disease 07/12/2022   Acute respiratory failure with hypoxia (HCC) 07/12/2022   Pleural effusion 05/12/2022   Lower extremity edema 05/12/2022   Pancytopenia ([place]) 04/16/2022   Type 2 diabetes mellitus with proteinuria ([place]) 04/09/2022   Atherosclerosis of aorta ([place]) 04/09/2022   Chronic obstructive pulmonary disease (COPD) (HCC) 04/09/2022   SOB (shortness of breath) on exertion 04/09/2022   Acute cough 04/09/2022   Cigarette nicotine dependence, uncomplicated 67/67/2094   Post herpetic neuralgia 03/14/2020   Gall stones 10/23/2019    Generalized lymphadenopathy 09/08/2018   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

## 2022-07-13 NOTE — Procedures (Signed)
Ultrasound-guided diagnostic and therapeutic right sided thoracentesis performed yielding 850 mil liters of amber colored fluid. No immediate complications.   Diagnostic fluid was sent to the lab for further analysis. Follow-up chest x-ray pending. EBL is < 2 ml.

## 2022-07-13 NOTE — Evaluation (Signed)
Occupational Therapy Evaluation Patient Details Name: Natasha [name] MRN: [id number] DOB: [birth year] Today's Date: 07/13/2022   History of Present Illness Per MD: 76 y.o. Caucasian female with medical history significant for type 2 diabetes mellitus, COPD, postherpetic neuralgia, tobacco use, recurrent pleural effusion and newly diagnosed gastric lymphoma and well as anemia and thrombocytopenia, who presented to the emergency room with acute onset of worsening dyspnea and testing positive for Covid-19.   Clinical Impression   Natasha [name] presents with generalized weakness, limited endurance, impaired balance, fatigue, and dyspnea. She lives in a single-story home, 2 STE, with family and roommates, is largely Mod I in basic ADLs, walks w/o AD although endorses 3 or more falls in previous year, does not drive, does perform household chores and cooking. During today's evaluation, her O2 sats are 97% while supine on 4L O2, dropping to 88% with OOB activity but recovering to upper 90s within 5 minutes of seated rest break. She is able to perform bed mobility, transfers, dressing, grooming in standing, all with SUPV-CGA -- no physical assistance needed, but pt occasionally unsteady in standing, using walls or furniture for support. Provided educ re: falls prevention, home modifications, reducing # of cigarettes smoked per day. Recommend ongoing OT during hospitalization, with DC home with [place].    Recommendations for follow up therapy are one component of a multi-disciplinary discharge planning process, led by the attending physician.  Recommendations may be updated based on patient status, additional functional criteria and insurance authorization.   Follow Up Recommendations  Home health OT    Assistance Recommended at Discharge Intermittent Supervision/Assistance  Patient can return home with the following A little help with bathing/dressing/bathroom;A little help with walking and/or transfers;Assistance  with cooking/housework;Assist for transportation    Functional Status Assessment  Patient has had a recent decline in their functional status and demonstrates the ability to make significant improvements in function in a reasonable and predictable amount of time.  Equipment Recommendations  None recommended by OT    Recommendations for Other Services Other (comment) (Home O2)     Precautions / Restrictions Precautions Precautions: Fall Precaution Comments: airborne/contact precautions Restrictions Weight Bearing Restrictions: No      Mobility Bed Mobility Overal bed mobility: Needs Assistance Bed Mobility: Supine to Sit, Sit to Supine     Supine to sit: Supervision Sit to supine: Supervision   General bed mobility comments: increased time and effort to complete.    Transfers Overall transfer level: Needs assistance Equipment used: None Transfers: Sit to/from Stand Sit to Stand: Supervision           General transfer comment: Able to perform but slow to do so and slightly unsteady      Balance Overall balance assessment: Mild deficits observed, not formally tested, History of Falls                                         ADL either performed or assessed with clinical judgement   ADL Overall ADL's : Needs assistance/impaired Eating/Feeding: Modified independent;Set up   Grooming: Oral care;Wash/dry face;Wash/dry hands;Standing;Supervision/safety               Lower Body Dressing: Min guard;Sitting/lateral leans Lower Body Dressing Details (indicate cue type and reason): donning/doffing socks. Extra time/effort required             Functional mobility during ADLs: Supervision/safety;Min guard General ADL Comments:  SUPV-CGA for safety with OOB fxl mobility w/o RW     Vision         Perception     Praxis      Pertinent Vitals/Pain Pain Assessment Pain Assessment: No/denies pain     Hand Dominance Left    Extremity/Trunk Assessment Upper Extremity Assessment Upper Extremity Assessment: Generalized weakness   Lower Extremity Assessment Lower Extremity Assessment: Generalized weakness       Communication Communication Communication: No difficulties   Cognition Arousal/Alertness: Awake/alert Behavior During Therapy: WFL for tasks assessed/performed Overall Cognitive Status: Within Functional Limits for tasks assessed                                       General Comments       Exercises Other Exercises Other Exercises: Educ re: falls prevention, potential trial of RW, DC recs, harm reduction/smoking cessation   Shoulder Instructions      Home Living Family/patient expects to be discharged to:: Private residence Living Arrangements: Other relatives;Non-relatives/Friends Available Help at Discharge: Family;Available 24 hours/day (Lives with daughter, 64 year old Health and safety inspector, + 2 roommates) Type of Home: House Home Access: Stairs to enter Technical brewer of Steps: 2 Entrance Stairs-Rails: None Home Layout: One level     Bathroom Shower/Tub: Teacher, early years/pre: Standard     Home Equipment: Conservation officer, nature (2 wheels);Cane - single point   Additional Comments: Roommates endorse fall 2 days ago; Endorses 3 falls in the past 6 months.      Prior Functioning/Environment Prior Level of Function : Needs assist       Physical Assist : ADLs (physical)   ADLs (physical): Grooming;Bathing;Dressing;Toileting Mobility Comments: Ambulates w/o AD, reports 3 falls this year ADLs Comments: Largely IND in BADL, although reports getting into tub/shower is sometimes difficult, as if getting up from toilet. Does not drive. Daughter shops or has groceries delivered. Pt performs housework, cooking        OT Problem List: Decreased strength;Decreased activity tolerance;Cardiopulmonary status limiting activity;Decreased knowledge of use of DME or  AE      OT Treatment/Interventions: Self-care/ADL training;Patient/family education;Therapeutic exercise;Balance training;Energy conservation;Therapeutic activities;DME and/or AE instruction    OT Goals(Current goals can be found in the care plan section) Acute Rehab OT Goals Patient Stated Goal: to be able to breath better OT Goal Formulation: With patient Time For Goal Achievement: 07/27/22 Potential to Achieve Goals: Good ADL Goals Pt Will Perform Grooming: standing;with modified independence (including hair care) Pt Will Transfer to Toilet: regular height toilet;stand pivot transfer Pt Will Perform Tub/Shower Transfer: shower seat;with modified independence (using LRAD)  OT Frequency: Min 2X/week    Co-evaluation              AM-PAC OT "6 Clicks" Daily Activity     Outcome Measure Help from another person eating meals?: None Help from another person taking care of personal grooming?: A Little Help from another person toileting, which includes using toliet, bedpan, or urinal?: A Little Help from another person bathing (including washing, rinsing, drying)?: A Little Help from another person to put on and taking off regular upper body clothing?: A Little Help from another person to put on and taking off regular lower body clothing?: A Little 6 Click Score: 19   End of Session    Activity Tolerance: Patient tolerated treatment well Patient left: in bed;with call bell/phone within reach;with bed alarm set  OT  Visit Diagnosis: Muscle weakness (generalized) (M62.81);Unsteadiness on feet (R26.81);History of falling (Z91.81)                Time: 4580-9983 OT Time Calculation (min): 27 min Charges:  OT General Charges $OT Visit: 1 Visit OT Evaluation $OT Eval Low Complexity: 1 Low OT Treatments $Self Care/Home Management : 23-37 mins Josiah Lobo, PhD, MS, OTR/L 07/13/22, 4:41 PM

## 2022-07-13 NOTE — Progress Notes (Signed)
PROGRESS NOTE    Natasha [name]  DJS:[id number] DOB: [birth year] DOA: 07/12/2022 PCP: Jon Billings, NP   Brief Narrative:  76 y.o. Caucasian female with medical history significant for type 2 diabetes mellitus, COPD, postherpetic neuralgia, tobacco use, recurrent pleural effusion and newly diagnosed gastric lymphoma and well as anemia and thrombocytopenia, who presented to the emergency room with acute onset of worsening dyspnea. Also had a fall at home due to weakness. Earlier this month she had positive COVID on outpatient basis and was given p.o. Paxlovid.  EKG showed PACs, chest x-ray was suggestive of large bilateral pleural effusion/CHF. She had similar presentation in August 2023 requiring thoracentesis, 850 cc removed. Another thoracentesis performed on 9/19, 850cc removed.    Assessment & Plan:  Principal Problem:   Bilateral pleural effusion Active Problems:   Acute respiratory failure with hypoxia (HCC)   Chronic obstructive pulmonary disease (COPD) (HCC)   Type 2 diabetes mellitus with chronic kidney disease     Assessment and Plan: * Bilateral pleural effusion, recurrent Acute hypoxic respiratory failure - Secondary to lymphoma versus new onset CHF versus recent COVID-19 infection.  Thoracentesis performed this morning.  Echocardiogram ordered.  Getting IV Lasix.  Check procalcitonin to rule out superimposed bacterial infection.  Bronchodilators/I-S.  Stop IVF while she is getting lasix. Supplemental oxygen as needed. CTA chest= negative for PE, bilateral large pleural effusion, abdominal lymphadenopathy and splenomegaly  COVID-19 infection - Per patient she was diagnosed outpatient on home test on 9/6.  On Paxlovid.  Weakness/Fall -CTH negative.   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 Follows with outpatient Dr. Janese Banks.  There is concerns about worsening lymphoma but patient not sure about proceeding with any form of treatment  besides symptomatic management.  Patient and family stating they have a follow-up appointment outpatient this Friday for a PET scan thereafter lymph node biopsy to discuss further options.  Anemia/thrombocytopenia - Baseline hemoglobin 7.5.  Platelets around 80 K  Type 2 diabetes mellitus with chronic kidney disease - Sliding scale and Accu-Cheks  Chronic obstructive pulmonary disease (COPD) (HCC) - Bronchodilators, I-S/flutter valve   Seen by palliative care outpatient, at that time patient wanted to be Full code as she wanted CPR but unsure about ventilation.    Will reconsult Palliative care. Overall Poor prognosis without further treatment.     DVT prophylaxis: Lovenox Code Status: Full Code Family Communication: Patient's roommates at bedside  Status is: Inpatient Current hospital stay to monitor her respiratory symptoms     Subjective:  Feels little relief and shortness of breath after thoracentesis this morning. Roommate present at bedside tells me that she has had exertional dyspnea at home.  Her other roommate was having some URI type symptoms therefore they all home tested and patient was tested COVID-19 positive on 9/6.  Examination:  General exam: Appears calm and comfortable.  Chronically ill Respiratory system: Diminished breath sounds bilaterally at bases Cardiovascular system: S1 & S2 heard, RRR. No JVD, murmurs, rubs, gallops or clicks. No pedal edema. Gastrointestinal system: Abdomen is nondistended, soft and nontender. No organomegaly or masses felt. Normal bowel sounds heard. Central nervous system: Alert and oriented. No focal neurological deficits. Extremities: Symmetric 4 x 5 power. Skin: No rashes, lesions or ulcers, right eye bruising Psychiatry: Judgement and insight appear normal. Mood & affect appropriate.     Objective: Vitals:   07/13/22 0500 07/13/22 0528 07/13/22 0600 07/13/22 0652  BP:  (!) 90/47 92/62   Pulse:  81  85  Resp:  17     Temp:  98 F (36.7 C)    TempSrc:  Oral    SpO2:  98% 100% 95%  Weight: 77.2 kg     Height:        Intake/Output Summary (Last 24 hours) at 07/13/2022 0719 Last data filed at 07/13/2022 0600 Gross per 24 hour  Intake 762.99 ml  Output 400 ml  Net 362.99 ml   Filed Weights   07/12/22 1330 07/13/22 0500  Weight: 70 kg 77.2 kg     Data Reviewed:   CBC: Recent Labs  Lab 07/12/22 1335 07/13/22 0504  WBC 8.2 6.6  NEUTROABS 0.5*  --   HGB 8.0* 7.0*  HCT 28.4* 24.7*  MCV 97.9 97.2  PLT 98* 83*   Basic Metabolic Panel: Recent Labs  Lab 07/12/22 1335 07/13/22 0504  NA 142 144  K 4.9 4.0  CL 103 107  CO2 29 30  GLUCOSE 154* 128*  BUN 27* 27*  CREATININE 1.25* 1.25*  CALCIUM 8.7* 7.9*   GFR: Estimated Creatinine Clearance: 38 mL/min (A) (by C-G formula based on SCr of 1.25 mg/dL (H)). Liver Function Tests: No results for input(s): "AST", "ALT", "ALKPHOS", "BILITOT", "PROT", "ALBUMIN" in the last 168 hours. No results for input(s): "LIPASE", "AMYLASE" in the last 168 hours. No results for input(s): "AMMONIA" in the last 168 hours. Coagulation Profile: No results for input(s): "INR", "PROTIME" in the last 168 hours. Cardiac Enzymes: No results for input(s): "CKTOTAL", "CKMB", "CKMBINDEX", "TROPONINI" in the last 168 hours. BNP (last 3 results) No results for input(s): "PROBNP" in the last 8760 hours. HbA1C: No results for input(s): "HGBA1C" in the last 72 hours. CBG: No results for input(s): "GLUCAP" in the last 168 hours. Lipid Profile: No results for input(s): "CHOL", "HDL", "LDLCALC", "TRIG", "CHOLHDL", "LDLDIRECT" in the last 72 hours. Thyroid Function Tests: No results for input(s): "TSH", "T4TOTAL", "FREET4", "T3FREE", "THYROIDAB" in the last 72 hours. Anemia Panel: No results for input(s): "VITAMINB12", "FOLATE", "FERRITIN", "TIBC", "IRON", "RETICCTPCT" in the last 72 hours. Sepsis Labs: No results for input(s): "PROCALCITON", "LATICACIDVEN" in the  last 168 hours.  Recent Results (from the past 240 hour(s))  SARS Coronavirus 2 by RT PCR (hospital order, performed in [facility] hospital lab) *cepheid single result test* Anterior Nasal Swab     Status: Abnormal   Collection Time: 07/12/22 10:51 PM   Specimen: Anterior Nasal Swab  Result Value Ref Range Status   SARS Coronavirus 2 by RT PCR POSITIVE (A) NEGATIVE Final    Comment: (NOTE) SARS-CoV-2 target nucleic acids are DETECTED  SARS-CoV-2 RNA is generally detectable in upper respiratory specimens  during the acute phase of infection.  Positive results are indicative  of the presence of the identified virus, but do not rule out bacterial infection or co-infection with other pathogens not detected by the test.  Clinical correlation with patient history and  other diagnostic information is necessary to determine patient infection status.  The expected result is negative.  Fact Sheet for Patients:   https://www.patel.info/   Fact Sheet for Healthcare Providers:   https://hall.com/    This test is not yet approved or cleared by the [place] FDA and  has been authorized for detection and/or diagnosis of SARS-CoV-2 by FDA under an Emergency Use Authorization (EUA).  This EUA will remain in effect (meaning this test can be used) for the duration of  the COVID-19 declaration under Section 564(b)(1)  of the Act, 21 U.S.C. section 360-bbb-3(b)(1),  unless the authorization is terminated or revoked sooner.   Performed at [facility], [address] [place],  [address]          Radiology Studies: CT Angio Chest PE W and/or Wo Contrast  Result Date: 07/12/2022 CLINICAL DATA:  Severe shortness of breath, weakness, fell yesterday, history of lymphoma EXAM: CT ANGIOGRAPHY CHEST WITH CONTRAST TECHNIQUE: Multidetector CT imaging of the chest was performed using the standard protocol during bolus administration of  intravenous contrast. Multiplanar CT image reconstructions and MIPs were obtained to evaluate the vascular anatomy. RADIATION DOSE REDUCTION: This exam was performed according to the departmental dose-optimization program which includes automated exposure control, adjustment of the mA and/or kV according to patient size and/or use of iterative reconstruction technique. CONTRAST:  40m OMNIPAQUE IOHEXOL 350 MG/ML SOLN COMPARISON:  06/16/2022, 04/16/2022 FINDINGS: Cardiovascular: This is a technically adequate evaluation of the pulmonary vasculature. No filling defects or pulmonary emboli. The heart is unremarkable without pericardial effusion. No evidence of thoracic aortic aneurysm or dissection. Mediastinum/Nodes: The extensive mediastinal, hilar, and axillary adenopathy seen on prior exam again identified, not appreciably changed. Index lymph nodes are as follows: Right axilla, image 24/4, 3.1 x 2.0 cm. Precarinal, image 42/4, 1.7 x 2.1 cm. Stable appearance of the thyroid, trachea, and esophagus. Lungs/Pleura: There are persistent bilateral pleural effusions, right greater than left, increased since prior study. Dense consolidation within the lung bases, greatest in the lower lobes, most compatible with compressive atelectasis. Partially calcified right upper lobe nodule again identified reference image 51/4 measuring up to 2 cm. No pneumothorax. Central airways are patent. Upper Abdomen: Partial visualization of marked splenomegaly and extensive upper abdominal lymphadenopathy. Musculoskeletal: There is diffuse body wall edema. No acute or destructive bony lesions. Reconstructed images demonstrate no additional findings. Review of the MIP images confirms the above findings. IMPRESSION: 1. No evidence of pulmonary embolus. 2. Worsening large bilateral pleural effusions with underlying lower lobe atelectasis. 3. Diffuse anasarca. 4. Extensive thoracic and abdominal lymphadenopathy, as well as marked splenomegaly,  compatible with patient's known history of lymphoma. Electronically Signed   By: MRanda NgoM.D.   On: 07/12/2022 17:05   CT Head Wo Contrast  Result Date: 07/12/2022 CLINICAL DATA:  Fall.  History of lymphoma. EXAM: CT HEAD WITHOUT CONTRAST TECHNIQUE: Contiguous axial images were obtained from the base of the skull through the vertex without intravenous contrast. RADIATION DOSE REDUCTION: This exam was performed according to the departmental dose-optimization program which includes automated exposure control, adjustment of the mA and/or kV according to patient size and/or use of iterative reconstruction technique. COMPARISON:  03/19/2020 head CT, 09/29/2018 PET-CT FINDINGS: Brain: No evidence of acute infarction, hemorrhage, hydrocephalus, extra-axial collection or mass lesion/mass effect. Patchy low-density changes within the periventricular and subcortical white matter compatible with chronic microvascular ischemic change. Mild diffuse cerebral volume loss. Vascular: No hyperdense vessel or unexpected calcification. Skull: Normal. Negative for fracture or focal lesion. Sinuses/Orbits: Mucosal thickening of the right maxillary and right frontal sinuses. Partial bilateral mastoid effusions. Other: Marked enlargement of the bilateral parotid glands is new from 2019. IMPRESSION: 1. No acute intracranial findings. 2. Marked enlargement of the bilateral parotid glands, new from 2019. 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 3. Chronic microvascular ischemic change and cerebral volume loss. 4. Right maxillary and right frontal sinus disease. Partial bilateral mastoid effusions. Electronically Signed   By: NDavina PokeD.O.   On: 07/12/2022 15:36  DG Chest Portable 1 View  Result Date: 07/12/2022 CLINICAL DATA:  Shortness of breath, hypoxia EXAM: PORTABLE CHEST 1 VIEW COMPARISON:  Previous studies including the examination of  06/17/2022 FINDINGS: Transverse diameter of heart is increased. Central pulmonary vessels are more prominent. Moderate to large bilateral pleural effusions are seen, more so on the right side with significant interval increase. There is no pneumothorax. Evaluation of mid and lower lung fields for focal infiltrates is limited by the effusions. IMPRESSION: Central pulmonary vessels are more prominent suggesting CHF. Moderate to large bilateral pleural effusions, more so on the right side with significant interval increase. Electronically Signed   By: Elmer Picker M.D.   On: 07/12/2022 15:05        Scheduled Meds:  enoxaparin (LOVENOX) injection  40 mg Subcutaneous Q24H   furosemide  20 mg Intravenous Q12H   triamcinolone cream   Topical BID   Continuous Infusions:  sodium chloride 100 mL/hr at 07/13/22 0600     LOS: 1 day   Time spent= 35 mins    Marquelle Balow Arsenio Loader, MD [facility]  If 7PM-7AM, please contact night-coverage  07/13/2022, 7:19 AM

## 2022-07-13 NOTE — Progress Notes (Signed)
*  PRELIMINARY RESULTS* Echocardiogram 2D Echocardiogram has been performed.  Natasha [name] 07/13/2022, 1:53 PM

## 2022-07-14 ENCOUNTER — Other Ambulatory Visit: Payer: Self-pay

## 2022-07-14 DIAGNOSIS — I5033 Acute on chronic diastolic (congestive) heart failure: Secondary | ICD-10-CM | POA: Diagnosis not present

## 2022-07-14 DIAGNOSIS — U071 COVID-19: Secondary | ICD-10-CM | POA: Diagnosis not present

## 2022-07-14 DIAGNOSIS — I509 Heart failure, unspecified: Secondary | ICD-10-CM | POA: Diagnosis not present

## 2022-07-14 DIAGNOSIS — D638 Anemia in other chronic diseases classified elsewhere: Secondary | ICD-10-CM

## 2022-07-14 DIAGNOSIS — N189 Chronic kidney disease, unspecified: Secondary | ICD-10-CM | POA: Diagnosis not present

## 2022-07-14 DIAGNOSIS — N1831 Chronic kidney disease, stage 3a: Secondary | ICD-10-CM

## 2022-07-14 DIAGNOSIS — E039 Hypothyroidism, unspecified: Secondary | ICD-10-CM

## 2022-07-14 DIAGNOSIS — J90 Pleural effusion, not elsewhere classified: Secondary | ICD-10-CM | POA: Diagnosis not present

## 2022-07-14 DIAGNOSIS — J9691 Respiratory failure, unspecified with hypoxia: Secondary | ICD-10-CM | POA: Diagnosis not present

## 2022-07-14 DIAGNOSIS — D696 Thrombocytopenia, unspecified: Secondary | ICD-10-CM

## 2022-07-14 DIAGNOSIS — J9601 Acute respiratory failure with hypoxia: Secondary | ICD-10-CM | POA: Diagnosis not present

## 2022-07-14 LAB — BASIC METABOLIC PANEL
Anion gap: 6 (ref 5–15)
BUN: 27 mg/dL — ABNORMAL HIGH (ref 8–23)
CO2: 31 mmol/L (ref 22–32)
Calcium: 7.9 mg/dL — ABNORMAL LOW (ref 8.9–10.3)
Chloride: 105 mmol/L (ref 98–111)
Creatinine, Ser: 1.17 mg/dL — ABNORMAL HIGH (ref 0.44–1.00)
GFR, Estimated: 49 mL/min — ABNORMAL LOW (ref >60)
Glucose, Bld: 130 mg/dL — ABNORMAL HIGH (ref 70–99)
Potassium: 3.7 mmol/L (ref 3.5–5.1)
Sodium: 142 mmol/L (ref 135–145)

## 2022-07-14 LAB — MAGNESIUM: Magnesium: 1.8 mg/dL (ref 1.7–2.4)

## 2022-07-14 LAB — CBC
HCT: 24.8 % — ABNORMAL LOW (ref 36.0–46.0)
Hemoglobin: 7.2 g/dL — ABNORMAL LOW (ref 12.0–15.0)
MCH: 28 pg (ref 26.0–34.0)
MCHC: 29 g/dL — ABNORMAL LOW (ref 30.0–36.0)
MCV: 96.5 fL (ref 80.0–100.0)
Platelets: 85 10*3/uL — ABNORMAL LOW (ref 150–400)
RBC: 2.57 MIL/uL — ABNORMAL LOW (ref 3.87–5.11)
RDW: 19.9 % — ABNORMAL HIGH (ref 11.5–15.5)
WBC: 6.4 10*3/uL (ref 4.0–10.5)
nRBC: 1.9 % — ABNORMAL HIGH (ref 0.0–0.2)

## 2022-07-14 LAB — T4, FREE: Free T4: 0.78 ng/dL (ref 0.61–1.12)

## 2022-07-14 MED ORDER — NIRMATRELVIR/RITONAVIR (PAXLOVID) TABLET (RENAL DOSING)
2.0000 | ORAL_TABLET | Freq: Two times a day (BID) | ORAL | Status: DC
  Administered 2022-07-14 – 2022-07-16 (×5): 2 via ORAL
  Filled 2022-07-14: qty 20

## 2022-07-14 MED ORDER — LEVOTHYROXINE SODIUM 50 MCG PO TABS
50.0000 ug | ORAL_TABLET | Freq: Every day | ORAL | Status: DC
  Administered 2022-07-15 – 2022-07-16 (×2): 50 ug via ORAL
  Filled 2022-07-14 (×2): qty 1

## 2022-07-14 MED ORDER — FUROSEMIDE 10 MG/ML IJ SOLN
40.0000 mg | Freq: Two times a day (BID) | INTRAMUSCULAR | Status: DC
  Administered 2022-07-14 – 2022-07-16 (×5): 40 mg via INTRAVENOUS
  Filled 2022-07-14 (×5): qty 4

## 2022-07-14 NOTE — Hospital Course (Addendum)
76 y.o. Caucasian female with medical history significant for type 2 diabetes mellitus, COPD, postherpetic neuralgia, tobacco use, recurrent pleural effusion and newly diagnosed gastric lymphoma and well as anemia and thrombocytopenia, who presented to the emergency room with acute onset of worsening dyspnea. Also had a fall at home due to weakness. Earlier this month she had positive COVID on outpatient basis and was given p.o. Paxlovid.  He also had recurrent pleural effusion, 850 mL of pleural fluid removed on 9/19. Patient also have evidence of pulm edema on chest x-ray, she has a mild elevation of BNP, she was placed on IV Lasix for acute on chronic congestive heart failure.  Echocardiogram showed ejection fraction 60 to 65%.

## 2022-07-14 NOTE — Progress Notes (Signed)
Physical Therapy Treatment Patient Details Name: Natasha [name] MRN: [id number] DOB: 10-29-1945 Today's Date: 07/14/2022   History of Present Illness Per MD: 76 y.o. Caucasian female with medical history significant for type 2 diabetes mellitus, COPD, postherpetic neuralgia, tobacco use, recurrent pleural effusion and newly diagnosed gastric lymphoma and well as anemia and thrombocytopenia, who presented to the emergency room with acute onset of worsening dyspnea and testing positive for Covid-19.    PT Comments    Pt was sitting EOB upon arriving. She is A and O and agreeable to session. Does endorse eagerness to DC home. Was able to exit bed, stand, and ambulate with very minimal assist. Vcs for increased BOS during gait but overall pretty stable. Do recommend DC home with HHPT to follow to maximize independence by improving strength, balance, and overall safety with ADLs.    Recommendations for follow up therapy are one component of a multi-disciplinary discharge planning process, led by the attending physician.  Recommendations may be updated based on patient status, additional functional criteria and insurance authorization.  Follow Up Recommendations  Home health PT     Assistance Recommended at Discharge PRN  Patient can return home with the following A little help with walking and/or transfers;A little help with bathing/dressing/bathroom;Assistance with cooking/housework;Help with stairs or ramp for entrance   Equipment Recommendations  None recommended by PT       Precautions / Restrictions Precautions Precautions: Fall Precaution Comments: airborne/contact precautions Restrictions Weight Bearing Restrictions: No     Mobility  Bed Mobility Overal bed mobility: Needs Assistance Bed Mobility: Supine to Sit, Sit to Supine  Supine to sit: Supervision Sit to supine: Supervision  Transfers Overall transfer level: Needs assistance Equipment used: None Transfers: Sit to/from  Stand Sit to Stand: Supervision   Ambulation/Gait Ambulation/Gait assistance: Supervision Gait Distance (Feet): 120 Feet Assistive device: None Gait Pattern/deviations: Step-through pattern, Trunk flexed, Narrow base of support Gait velocity: decreased   General Gait Details: pt was easily and safely able to ambulate in room ~ 8 laps without LOB or safety concern.     Balance Overall balance assessment: Mild deficits observed, not formally tested, History of Falls    Cognition Arousal/Alertness: Awake/alert Behavior During Therapy: WFL for tasks assessed/performed Overall Cognitive Status: Within Functional Limits for tasks assessed    General Comments: pt is A and O x 4               Pertinent Vitals/Pain Pain Assessment Pain Assessment: No/denies pain     PT Goals (current goals can now be found in the care plan section) Acute Rehab PT Goals Patient Stated Goal: to go home. Progress towards PT goals: Progressing toward goals    Frequency    Min 2X/week      PT Plan Current plan remains appropriate       AM-PAC PT "6 Clicks" Mobility   Outcome Measure  Help needed turning from your back to your side while in a flat bed without using bedrails?: A Little Help needed moving from lying on your back to sitting on the side of a flat bed without using bedrails?: A Little Help needed moving to and from a bed to a chair (including a wheelchair)?: A Little Help needed standing up from a chair using your arms (e.g., wheelchair or bedside chair)?: A Little Help needed to walk in hospital room?: A Little Help needed climbing 3-5 steps with a railing? : A Little 6 Click Score: 18    End of  Session Equipment Utilized During Treatment: Gait belt Activity Tolerance: Patient tolerated treatment well Patient left: with call bell/phone within reach;with family/visitor present Nurse Communication: Mobility status PT Visit Diagnosis: Unsteadiness on feet (R26.81);Other  abnormalities of gait and mobility (R26.89);Repeated falls (R29.6);Muscle weakness (generalized) (M62.81);History of falling (Z91.81);Difficulty in walking, not elsewhere classified (R26.2)     Time: 8185-9093 PT Time Calculation (min) (ACUTE ONLY): 9 min  Charges:  $Gait Training: 8-22 mins                     Julaine Fusi PTA 07/14/22, 4:02 PM

## 2022-07-14 NOTE — Plan of Care (Signed)

## 2022-07-14 NOTE — Progress Notes (Signed)
  Progress Note   Patient: Natasha [name] BJS:[id number] DOB: 09-07-1946 DOA: 07/12/2022     2 DOS: the patient was seen and examined on 07/14/2022   Brief hospital course: 76 y.o. Caucasian female with medical history significant for type 2 diabetes mellitus, COPD, postherpetic neuralgia, tobacco use, recurrent pleural effusion and newly diagnosed gastric lymphoma and well as anemia and thrombocytopenia, who presented to the emergency room with acute onset of worsening dyspnea. Also had a fall at home due to weakness. Earlier this month she had positive COVID on outpatient basis and was given p.o. Paxlovid.  He also had recurrent pleural effusion, 850 mL of pleural fluid removed on 9/19. Patient also have evidence of pulm edema on chest x-ray, she has a mild elevation of BNP, she was placed on IV Lasix for acute on chronic congestive heart failure.  Echocardiogram showed ejection fraction 60 to 65%.  Assessment and Plan:  * Bilateral pleural effusion Appears to be secondary to lymphoma.  Congestive heart failure may also played a role. Lab results of pleural fluids showed majority of lymphocytes, consistent with lymphoma. Continue to follow.  Acute respiratory failure with hypoxia (HCC) Acute on chronic diastolic congestive heart failure. COVID infection. Patient still has some volume overload, she has increased short of breath.  Chest x-ray showed pulm edema, significant leg edema.  Continue IV Lasix, I have increased to 40 mg twice a day. Continue to complete Paxlovid. Reviewed echocardiogram, ejection fraction 60 to 65%.  Lymphoma. 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 Patient does not have iron or B12 deficiency.  We will give 1 unit PRBC transfusion.  Chronic kidney disease stage IIIa. Continue to follow after diuretics.  Hypothyroidism. Started Synthroid.   Type 2 diabetes mellitus with chronic kidney disease Continue sliding scale  insulin.  Chronic obstructive pulmonary disease (COPD) (HCC) No evidence of bronchospasm.      Subjective:  Patient still requiring oxygen, short of breath with exertion. Cough, nonproductive.  Physical Exam: Vitals:   07/13/22 1947 07/14/22 0500 07/14/22 0518 07/14/22 0747  BP: 101/65  (!) 103/59 101/62  Pulse: 83  82 84  Resp: '17  19 20  '$ Temp: 98.4 F (36.9 C)  98.6 F (37 C) 98.2 F (36.8 C)  TempSrc: Oral   Oral  SpO2: 99%  100% 99%  Weight:  77.1 kg    Height:       General exam: Appears calm and comfortable  Respiratory system: Decreased breath sounds. Respiratory effort normal. Cardiovascular system: S1 & S2 heard, RRR. No JVD, murmurs, rubs, gallops or clicks.  Gastrointestinal system: Abdomen is nondistended, soft and nontender. No organomegaly or masses felt. Normal bowel sounds heard. Central nervous system: Alert and oriented. No focal neurological deficits. Extremities: 2+edema Skin: No rashes, lesions or ulcers Psychiatry: Judgement and insight appear normal. Mood & affect appropriate. '  Data Reviewed:  Reviewed the imaging studies, all lab results.  Family Communication: daughter updated  Disposition: Status is: Inpatient Remains inpatient appropriate because: Severity of disease, IV treatment.  Planned Discharge Destination: Home with Home Health    Time spent: 55 minutes  Author: Sharen Hones, MD 07/14/2022 1:30 PM  For on call review [email or web address].

## 2022-07-14 NOTE — Progress Notes (Signed)
Spoke with patient regarding discharge plan. Patient stated she had home health prior to this admission but could not remember the name of the agency. DME recommended per therapy for rollator. Patient stated she did not want a rollator. Patient's pharmacy is [facility]. Her daughter will transport her home.

## 2022-07-15 DIAGNOSIS — J90 Pleural effusion, not elsewhere classified: Secondary | ICD-10-CM | POA: Diagnosis not present

## 2022-07-15 DIAGNOSIS — Z7189 Other specified counseling: Secondary | ICD-10-CM

## 2022-07-15 DIAGNOSIS — Z515 Encounter for palliative care: Secondary | ICD-10-CM

## 2022-07-15 LAB — BASIC METABOLIC PANEL
Anion gap: 9 (ref 5–15)
BUN: 27 mg/dL — ABNORMAL HIGH (ref 8–23)
CO2: 31 mmol/L (ref 22–32)
Calcium: 8.3 mg/dL — ABNORMAL LOW (ref 8.9–10.3)
Chloride: 101 mmol/L (ref 98–111)
Creatinine, Ser: 1.12 mg/dL — ABNORMAL HIGH (ref 0.44–1.00)
GFR, Estimated: 52 mL/min — ABNORMAL LOW (ref >60)
Glucose, Bld: 118 mg/dL — ABNORMAL HIGH (ref 70–99)
Potassium: 4.2 mmol/L (ref 3.5–5.1)
Sodium: 141 mmol/L (ref 135–145)

## 2022-07-15 LAB — MAGNESIUM: Magnesium: 1.7 mg/dL (ref 1.7–2.4)

## 2022-07-15 LAB — CBC
HCT: 26.1 % — ABNORMAL LOW (ref 36.0–46.0)
Hemoglobin: 7.6 g/dL — ABNORMAL LOW (ref 12.0–15.0)
MCH: 27.9 pg (ref 26.0–34.0)
MCHC: 29.1 g/dL — ABNORMAL LOW (ref 30.0–36.0)
MCV: 96 fL (ref 80.0–100.0)
Platelets: 77 10*3/uL — ABNORMAL LOW (ref 150–400)
RBC: 2.72 MIL/uL — ABNORMAL LOW (ref 3.87–5.11)
RDW: 19.9 % — ABNORMAL HIGH (ref 11.5–15.5)
WBC: 7.6 10*3/uL (ref 4.0–10.5)
nRBC: 1.1 % — ABNORMAL HIGH (ref 0.0–0.2)

## 2022-07-15 LAB — GLUCOSE, CAPILLARY
Glucose-Capillary: 190 mg/dL — ABNORMAL HIGH (ref 70–99)
Glucose-Capillary: 330 mg/dL — ABNORMAL HIGH (ref 70–99)
Glucose-Capillary: 314 mg/dL — ABNORMAL HIGH (ref 70–99)

## 2022-07-15 LAB — ABO/RH: ABO/RH(D): O POS

## 2022-07-15 LAB — PREPARE RBC (CROSSMATCH)

## 2022-07-15 MED ORDER — DEXAMETHASONE 4 MG PO TABS
6.0000 mg | ORAL_TABLET | Freq: Every day | ORAL | Status: DC
  Administered 2022-07-15 – 2022-07-16 (×2): 6 mg via ORAL
  Filled 2022-07-15 (×2): qty 2

## 2022-07-15 MED ORDER — PNEUMOCOCCAL 20-VAL CONJ VACC 0.5 ML IM SUSY
0.5000 mL | PREFILLED_SYRINGE | INTRAMUSCULAR | Status: AC
  Administered 2022-07-16: 0.5 mL via INTRAMUSCULAR
  Filled 2022-07-15 (×2): qty 0.5

## 2022-07-15 MED ORDER — INSULIN ASPART 100 UNIT/ML IJ SOLN
0.0000 [IU] | Freq: Three times a day (TID) | INTRAMUSCULAR | Status: DC
  Administered 2022-07-15: 2 [IU] via SUBCUTANEOUS
  Filled 2022-07-15: qty 1

## 2022-07-15 MED ORDER — SODIUM CHLORIDE 0.9% IV SOLUTION: Freq: Once | INTRAVENOUS | Status: AC

## 2022-07-15 MED ORDER — INFLUENZA VAC A&B SA ADJ QUAD 0.5 ML IM PRSY
0.5000 mL | PREFILLED_SYRINGE | INTRAMUSCULAR | Status: AC
  Administered 2022-07-16: 0.5 mL via INTRAMUSCULAR
  Filled 2022-07-15: qty 0.5

## 2022-07-15 MED ORDER — INSULIN ASPART 100 UNIT/ML IJ SOLN: 0.0000 [IU] | Freq: Three times a day (TID) | INTRAMUSCULAR | Status: DC

## 2022-07-15 MED ORDER — INSULIN ASPART 100 UNIT/ML IJ SOLN
0.0000 [IU] | Freq: Three times a day (TID) | INTRAMUSCULAR | Status: DC
  Administered 2022-07-15: 7 [IU] via SUBCUTANEOUS
  Administered 2022-07-16: 9 [IU] via SUBCUTANEOUS
  Administered 2022-07-16: 5 [IU] via SUBCUTANEOUS
  Filled 2022-07-15 (×3): qty 1

## 2022-07-15 NOTE — Progress Notes (Signed)
Occupational Therapy Treatment Patient Details Name: Natasha [name] MRN: [id number] DOB: September 11, 1946 Today's Date: 07/15/2022   History of present illness Per MD: 76 y.o. Caucasian female with medical history significant for type 2 diabetes mellitus, COPD, postherpetic neuralgia, tobacco use, recurrent pleural effusion and newly diagnosed gastric lymphoma and well as anemia and thrombocytopenia, who presented to the emergency room with acute onset of worsening dyspnea and testing positive for Covid-19.   OT comments  Natasha [name] was seen for OT treatment on this date. Upon arrival to room pt awake/alert, seated EOB. Pt agreeable to OT tx session, but denies need to engage in functional ADL management during session. She performs functional mobility in room with supervision for safety. Pt educated on energy conservation strategies for home and hospital with emphasis on activity pacing and pursed lipped breathing.  Pt return verbalized understanding of instruction provided. Pt eager to sit up in chair at end of session. OT facilitated transition to room recliner and pt left with all needs met, VSS.  Pt making good progress toward goals and continues to benefit from skilled OT services to maximize return to PLOF and minimize risk of future falls, injury, caregiver burden, and readmission. Will continue to follow POC. Discharge recommendation remains appropriate.     Recommendations for follow up therapy are one component of a multi-disciplinary discharge planning process, led by the attending physician.  Recommendations may be updated based on patient status, additional functional criteria and insurance authorization.    Follow Up Recommendations  Home health OT    Assistance Recommended at Discharge Intermittent Supervision/Assistance  Patient can return home with the following  A little help with bathing/dressing/bathroom;A little help with walking and/or transfers;Assistance with  cooking/housework;Assist for transportation   Equipment Recommendations  None recommended by OT (Pt has necessary equipment.)    Recommendations for Other Services      Precautions / Restrictions Precautions Precautions: Fall Restrictions Weight Bearing Restrictions: No       Mobility Bed Mobility Overal bed mobility: Needs Assistance Bed Mobility: Supine to Sit, Sit to Supine     Supine to sit: Modified independent (Device/Increase time), HOB elevated Sit to supine: Modified independent (Device/Increase time), HOB elevated        Transfers Overall transfer level: Needs assistance Equipment used: None Transfers: Sit to/from Stand Sit to Stand: Supervision           General transfer comment: Able to ambulate in room with supervision/min cueing for safety.     Balance Overall balance assessment: Mild deficits observed, not formally tested, History of Falls                                         ADL either performed or assessed with clinical judgement   ADL Overall ADL's : Needs assistance/impaired                                     Functional mobility during ADLs: Supervision/safety;Cueing for safety General ADL Comments: Pt able to perform x4 laps around bed with supervision for safety, no AE this date. Pt reports she has been up in the room independently using her BSC. Educated on energy conservation strategies including activity pacing and PLB to maximize safety and functional independence with ADL management upon DC home.    Extremity/Trunk Assessment Upper  Extremity Assessment Upper Extremity Assessment: Generalized weakness   Lower Extremity Assessment Lower Extremity Assessment: Generalized weakness   Cervical / Trunk Assessment Cervical / Trunk Assessment: Normal    Vision Patient Visual Report: No change from baseline     Perception     Praxis      Cognition Arousal/Alertness: Awake/alert Behavior During  Therapy: WFL for tasks assessed/performed Overall Cognitive Status: Within Functional Limits for tasks assessed                                 General Comments: pt is A and O x 4        Exercises Other Exercises Other Exercises: Pt educated on energy conservation strategies and routines modifications to support safety and functional independence with ADL management upon hospital DC.    Shoulder Instructions       General Comments      Pertinent Vitals/ Pain       Pain Assessment Pain Assessment: No/denies pain  Home Living                                          Prior Functioning/Environment              Frequency  Min 2X/week        Progress Toward Goals  OT Goals(current goals can now be found in the care plan section)  Progress towards OT goals: Progressing toward goals  Acute Rehab OT Goals Patient Stated Goal: To go home OT Goal Formulation: With patient Time For Goal Achievement: 07/27/22 Potential to Achieve Goals: Good  Plan Discharge plan remains appropriate;Frequency remains appropriate    Co-evaluation                 AM-PAC OT "6 Clicks" Daily Activity     Outcome Measure   Help from another person eating meals?: None Help from another person taking care of personal grooming?: None Help from another person toileting, which includes using toliet, bedpan, or urinal?: A Little Help from another person bathing (including washing, rinsing, drying)?: A Little Help from another person to put on and taking off regular upper body clothing?: A Little Help from another person to put on and taking off regular lower body clothing?: A Little 6 Click Score: 20    End of Session Equipment Utilized During Treatment: Oxygen;Gait belt  OT Visit Diagnosis: Muscle weakness (generalized) (M62.81);Unsteadiness on feet (R26.81);History of falling (Z91.81)   Activity Tolerance Patient tolerated treatment well   Patient  Left with call bell/phone within reach;in chair;with chair alarm set   Nurse Communication          Time: 8453-6468 OT Time Calculation (min): 23 min  Charges: OT General Charges $OT Visit: 1 Visit OT Treatments $Self Care/Home Management : 23-37 mins  Shara Blazing, M.S., OTR/L Ascom: [phone] 07/15/22, 3:34 PM

## 2022-07-15 NOTE — Progress Notes (Signed)
  Progress Note   Patient: Natasha [name] VCB:[id number] DOB: May 16, 1946 DOA: 07/12/2022     3 DOS: the patient was seen and examined on 07/15/2022   Brief hospital course: 76 y.o. Caucasian female with medical history significant for type 2 diabetes mellitus, COPD, postherpetic neuralgia, tobacco use, recurrent pleural effusion and newly diagnosed gastric lymphoma and well as anemia and thrombocytopenia, who presented to the emergency room with acute onset of worsening dyspnea. Also had a fall at home due to weakness. Earlier this month she had positive COVID on outpatient basis and was given p.o. Paxlovid.  He also had recurrent pleural effusion, 850 mL of pleural fluid removed on 9/19. Patient also have evidence of pulm edema on chest x-ray, she has a mild elevation of BNP, she was placed on IV Lasix for acute on chronic congestive heart failure.  Echocardiogram showed ejection fraction 60 to 65%.  Assessment and Plan: Bilateral pleural effusion secondary to lymphoma. Discussed with cytology, pleural fluids study appear to support lymphoma.  Flow cytometry was sent out to confirm. Patient will need repeated thoracentesis in the future.   Acute respiratory failure with hypoxia (HCC) Acute on chronic diastolic congestive heart failure. COVID infection. Continue to complete Paxlovid. Echocardiogram showed ejection fraction 60 to 65%. Patient condition improving on diuretics, renal function is still getting better.  Continue another day of IV Lasix. Also added dexamethasone 6 mg oral daily.   Lymphoma. 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 Patient does not have iron or B12 deficiency.  Blood transfusion ordered yesterday, patient refused it.  Discussed with the patient and her daughter again today, patient misunderstood the daughter.  They are now agreeable. Hemoglobin today 7.6, due to congestive heart failure, will transfuse hb to 8.   Chronic kidney disease  stage IIIa. Renal function still getting better with diuretics.   Hypothyroidism. Diagnosis, continue Synthroid.     Type 2 diabetes mellitus with chronic kidney disease Continue sliding scale insulin.   Chronic obstructive pulmonary disease (COPD) (HCC) No evidence of bronchospasm.        Subjective:  Patient feels better, less shortness of breath.  No significant effusion.  Physical Exam: Vitals:   07/14/22 1634 07/14/22 2045 07/15/22 0442 07/15/22 0743  BP: 104/62 111/67 (!) 133/94 (!) 98/55  Pulse: 81 88 98 89  Resp: '18 17 16 18  '$ Temp: 98.3 F (36.8 C) 98.4 F (36.9 C) 99.3 F (37.4 C) 98 F (36.7 C)  TempSrc: Oral Oral Oral   SpO2: 100% 99% 91% 98%  Weight:   77.1 kg   Height:       General exam: Appears calm and comfortable  Respiratory system: Clear to auscultation. Respiratory effort normal. Cardiovascular system: S1 & S2 heard, RRR. No JVD, murmurs, rubs, gallops or clicks. No pedal edema. Gastrointestinal system: Abdomen is nondistended, soft and nontender. No organomegaly or masses felt. Normal bowel sounds heard. Central nervous system: Alert and oriented. No focal neurological deficits. Extremities: Symmetric 5 x 5 power. Skin: No rashes, lesions or ulcers Psychiatry: Judgement and insight appear normal. Mood & affect appropriate.   Data Reviewed:  Lab results reviewed.  Family Communication: Daughter updated over the phone.  Disposition: Status is: Inpatient Remains inpatient appropriate because: Severity of disease, IV treatment.  Planned Discharge Destination: Home with Home Health    Time spent: 35 minutes  Author: Sharen Hones, MD 07/15/2022 1:50 PM  For on call review [email or web address].

## 2022-07-15 NOTE — Plan of Care (Addendum)
Consulted noted. Preparing to enter room but staff is preparing to work with patient. Will follow up at another time. Per staff patient is working towards discharge. If patient discharges before PMT visit, would recommend outpatient palliative to follow.

## 2022-07-15 NOTE — TOC Progression Note (Addendum)
Transition of Care [facility]) - Progression Note    Patient Details  Name: Natasha [name] MRN: [id number] Date of Birth: Jun 29, 1946  Transition [facility]) CM/SW [place], LCSW Phone Number: 07/15/2022, 9:11 AM  Clinical Narrative:  Notified heart failure nurse navigator about heart failure consult.   11:11 am: Patient on COVID isolation precautions. Tried calling her in the room x 2 but no answer. Need to find out which home health agency she is active with so that we can update them on discharge plan. Received sticky note about arranging a hospital bed, home oxygen and supplies, a BP cuff, and a referral for pulmonology. Patient ambulated 120 feet with supervision with PT yesterday so it does not appear that she qualifies for a hospital bed. Sent secure chat to PT to confirm. Only able to provide BP cuff if patient is unable to afford one. MD will make pulmonology referral.  12:44 pm: Called into room and spoke to patient. Updated her about hospital bed. RN will complete sats test after lunch. Patient unable to afford BP cuff out of pocket so will check charity BP cuff availability. Patient cannot remember name of home health agency but said her daughter might. Called daughter and she said patient is not active with home health yet. Patient and daughter are aware of potential discharge tomorrow.  1:43 pm: Patient's PCP office had made a referral to [facility] on 9/11. Call liaison who stated she is not in their system but they can accept her for PT, OT, RN. Patient is aware and agreeable. Information added to AVS.  2:50 pm: Received call from Mr. Vira Browns. Discussed his concerns with her returning home. Oxygen sats test still pending. He requested a pulse oximeter as well as the BP cuff so he can more easily keep an eye on her vitals. Left voicemail for Angelica with CAP program. Will make referral when she calls back. Also gave phone number of CSW covering tomorrow in  case she is unable to call back today. [employer] is going to see if insurance will cover a hospital bed. Patient has been sleeping on the couch at home.  4:12 pm: Per [employer] representative, insurance should cover hospital bed and there will not be a copay. Ordered bed and oxygen through [employer]. Patient has been updated.  Expected Discharge Plan and Services                                                 Social Determinants of Health (SDOH) Interventions    Readmission Risk Interventions     No data to display

## 2022-07-15 NOTE — Progress Notes (Signed)
SATURATION QUALIFICATIONS: (This note is used to comply with regulatory documentation for home oxygen)  Patient Saturations on Room Air at Rest = 95%  Patient Saturations on Room Air while Ambulating = 86%  Patient Saturations on 2 Liters of oxygen while Ambulating = 95%  Please briefly explain why patient needs home oxygen:  Pt was at 95% on room air while resting, when ambulating, pt dropped to 86-89% on room air. Once placed back on 2 liters of oxygen, pt stayed at 95% or better when ambulating.

## 2022-07-15 NOTE — Progress Notes (Signed)
PT Cancellation Note  Patient Details Name: Natasha [name] MRN: [id number] DOB: [birth year]   Cancelled Treatment:     PT attempt. Pt is currently working with OT. PT will return tomorrow and continue to follow per current POC   Willette Pa 07/15/2022, 2:42 PM

## 2022-07-15 NOTE — TOC CM/SW Note (Signed)
    Durable Medical Equipment  (From admission, onward)           Start     Ordered   07/15/22 1606  For home use only DME Hospital bed  Once       Question Answer Comment  Length of Need Lifetime   Patient has (list medical condition): lymphoma, pleural effusion, anemia   The above medical condition requires: Patient requires the ability to reposition frequently   Bed type Semi-electric      07/15/22 1606

## 2022-07-15 NOTE — Plan of Care (Signed)

## 2022-07-15 NOTE — Care Management Important Message (Signed)
Important Message  Patient Details  Name: Natasha [name] MRN: [id number] Date of Birth: September 02, 1946   Medicare Important Message Given:  N/A - LOS <3 / Initial given by admissions     [name] 07/15/2022, 7:52 AM

## 2022-07-15 NOTE — Progress Notes (Signed)
   07/15/22 0800  Clinical Encounter Type  Visited With Other (Comment)  Visit Type Follow-up  Referral From Chaplain  Consult/Referral To Chaplain   Chaplain attempted follow up visit but patient was with care team. Chaplain services will follow up.

## 2022-07-16 ENCOUNTER — Encounter: Payer: Self-pay | Admitting: Nurse Practitioner

## 2022-07-16 ENCOUNTER — Ambulatory Visit: Payer: Medicare Other

## 2022-07-16 DIAGNOSIS — F17210 Nicotine dependence, cigarettes, uncomplicated: Secondary | ICD-10-CM | POA: Diagnosis not present

## 2022-07-16 DIAGNOSIS — J9691 Respiratory failure, unspecified with hypoxia: Secondary | ICD-10-CM | POA: Diagnosis not present

## 2022-07-16 DIAGNOSIS — I509 Heart failure, unspecified: Secondary | ICD-10-CM | POA: Diagnosis not present

## 2022-07-16 DIAGNOSIS — C8590 Non-Hodgkin lymphoma, unspecified, unspecified site: Secondary | ICD-10-CM

## 2022-07-16 DIAGNOSIS — N189 Chronic kidney disease, unspecified: Secondary | ICD-10-CM | POA: Diagnosis not present

## 2022-07-16 DIAGNOSIS — J90 Pleural effusion, not elsewhere classified: Secondary | ICD-10-CM | POA: Diagnosis not present

## 2022-07-16 LAB — TYPE AND SCREEN
ABO/RH(D): O POS
Antibody Screen: NEGATIVE
Unit division: 0

## 2022-07-16 LAB — BPAM RBC
Blood Product Expiration Date: 202310242359
ISSUE DATE / TIME: 202309211709
Unit Type and Rh: 5100

## 2022-07-16 LAB — BODY FLUID CULTURE W GRAM STAIN: Culture: NO GROWTH

## 2022-07-16 LAB — GLUCOSE, CAPILLARY
Glucose-Capillary: 280 mg/dL — ABNORMAL HIGH (ref 70–99)
Glucose-Capillary: 377 mg/dL — ABNORMAL HIGH (ref 70–99)
Glucose-Capillary: 319 mg/dL — ABNORMAL HIGH (ref 70–99)

## 2022-07-16 LAB — CBC
HCT: 24.4 % — ABNORMAL LOW (ref 36.0–46.0)
Hemoglobin: 7.6 g/dL — ABNORMAL LOW (ref 12.0–15.0)
MCH: 28.8 pg (ref 26.0–34.0)
MCHC: 31.1 g/dL (ref 30.0–36.0)
MCV: 92.4 fL (ref 80.0–100.0)
Platelets: 61 10*3/uL — ABNORMAL LOW (ref 150–400)
RBC: 2.64 MIL/uL — ABNORMAL LOW (ref 3.87–5.11)
RDW: 18.6 % — ABNORMAL HIGH (ref 11.5–15.5)
WBC: 5.9 10*3/uL (ref 4.0–10.5)
nRBC: 0.8 % — ABNORMAL HIGH (ref 0.0–0.2)

## 2022-07-16 LAB — BASIC METABOLIC PANEL
Anion gap: 6 (ref 5–15)
BUN: 36 mg/dL — ABNORMAL HIGH (ref 8–23)
CO2: 35 mmol/L — ABNORMAL HIGH (ref 22–32)
Calcium: 7.8 mg/dL — ABNORMAL LOW (ref 8.9–10.3)
Chloride: 97 mmol/L — ABNORMAL LOW (ref 98–111)
Creatinine, Ser: 1.13 mg/dL — ABNORMAL HIGH (ref 0.44–1.00)
GFR, Estimated: 51 mL/min — ABNORMAL LOW (ref >60)
Glucose, Bld: 299 mg/dL — ABNORMAL HIGH (ref 70–99)
Potassium: 3.8 mmol/L (ref 3.5–5.1)
Sodium: 138 mmol/L (ref 135–145)

## 2022-07-16 LAB — MAGNESIUM: Magnesium: 1.8 mg/dL (ref 1.7–2.4)

## 2022-07-16 MED ORDER — LEVOTHYROXINE SODIUM 50 MCG PO TABS: 50.0000 ug | ORAL_TABLET | Freq: Every day | ORAL | 0 refills | Status: DC

## 2022-07-16 MED ORDER — POTASSIUM CHLORIDE CRYS ER 10 MEQ PO TBCR: 10.0000 meq | EXTENDED_RELEASE_TABLET | Freq: Two times a day (BID) | ORAL | 0 refills | Status: DC

## 2022-07-16 MED ORDER — IPRATROPIUM-ALBUTEROL 20-100 MCG/ACT IN AERS
1.0000 | INHALATION_SPRAY | Freq: Four times a day (QID) | RESPIRATORY_TRACT | Status: DC
  Administered 2022-07-16 (×3): 1 via RESPIRATORY_TRACT
  Filled 2022-07-16: qty 4

## 2022-07-16 MED ORDER — DEXAMETHASONE 6 MG PO TABS: 6.0000 mg | ORAL_TABLET | Freq: Every day | ORAL | 0 refills | Status: AC

## 2022-07-16 MED ORDER — TORSEMIDE 20 MG PO TABS: 20.0000 mg | ORAL_TABLET | Freq: Every day | ORAL | 0 refills | Status: DC

## 2022-07-16 NOTE — TOC Transition Note (Signed)
Transition of Care [facility]) - CM/SW Discharge Note   Patient Details  Name: Natasha [name] MRN: [id number] Date of Birth: Sep 25, 1946  [facility] CM/SW Contact:  Beverly Sessions, RN Phone Number: 07/16/2022, 11:53 AM   Clinical Narrative:     Patient to discharge today back to [facility] and DC summary faxed to [facility] VM left for son to update.  Awaiting return call   Judson Roch with [facility] home health notified of discharge [facility] to provide transport at 2 pm  Per Eustaquio Maize at [facility] does not need to call report.  Bedside RN updated          Patient Goals and CMS Choice        Discharge Placement                       Discharge Plan and Services                                     Social Determinants of Health (SDOH) Interventions     Readmission Risk Interventions     No data to display

## 2022-07-16 NOTE — Plan of Care (Signed)
Problem: Education: Goal: Knowledge of General Education information will improve Description: Including pain rating scale, medication(s)/side effects and non-pharmacologic comfort measures 07/16/2022 1116 by Mancel Bale, RN Outcome: Adequate for Discharge 07/16/2022 0920 by Mancel Bale, RN Outcome: Progressing   Problem: Health Behavior/Discharge Planning: Goal: Ability to manage health-related needs will improve 07/16/2022 1116 by Mancel Bale, RN Outcome: Adequate for Discharge 07/16/2022 0920 by Mancel Bale, RN Outcome: Progressing   Problem: Clinical Measurements: Goal: Ability to maintain clinical measurements within normal limits will improve 07/16/2022 1116 by Mancel Bale, RN Outcome: Adequate for Discharge 07/16/2022 0920 by Mancel Bale, RN Outcome: Progressing Goal: Will remain free from infection 07/16/2022 1116 by Mancel Bale, RN Outcome: Adequate for Discharge 07/16/2022 0920 by Mancel Bale, RN Outcome: Progressing Goal: Diagnostic test results will improve 07/16/2022 1116 by Mancel Bale, RN Outcome: Adequate for Discharge 07/16/2022 0920 by Mancel Bale, RN Outcome: Progressing Goal: Respiratory complications will improve 07/16/2022 1116 by Mancel Bale, RN Outcome: Adequate for Discharge 07/16/2022 0920 by Mancel Bale, RN Outcome: Progressing Goal: Cardiovascular complication will be avoided 07/16/2022 1116 by Mancel Bale, RN Outcome: Adequate for Discharge 07/16/2022 0920 by Mancel Bale, RN Outcome: Progressing   Problem: Activity: Goal: Risk for activity intolerance will decrease 07/16/2022 1116 by Mancel Bale, RN Outcome: Adequate for Discharge 07/16/2022 0920 by Mancel Bale, RN Outcome: Progressing   Problem: Nutrition: Goal: Adequate nutrition will be maintained 07/16/2022 1116 by Mancel Bale, RN Outcome: Adequate for Discharge 07/16/2022 0920 by Mancel Bale, RN Outcome:  Progressing   Problem: Coping: Goal: Level of anxiety will decrease 07/16/2022 1116 by Mancel Bale, RN Outcome: Adequate for Discharge 07/16/2022 0920 by Mancel Bale, RN Outcome: Progressing   Problem: Elimination: Goal: Will not experience complications related to bowel motility 07/16/2022 1116 by Mancel Bale, RN Outcome: Adequate for Discharge 07/16/2022 0920 by Mancel Bale, RN Outcome: Progressing Goal: Will not experience complications related to urinary retention 07/16/2022 1116 by Mancel Bale, RN Outcome: Adequate for Discharge 07/16/2022 0920 by Mancel Bale, RN Outcome: Progressing   Problem: Pain Managment: Goal: General experience of comfort will improve 07/16/2022 1116 by Mancel Bale, RN Outcome: Adequate for Discharge 07/16/2022 0920 by Mancel Bale, RN Outcome: Progressing   Problem: Safety: Goal: Ability to remain free from injury will improve 07/16/2022 1116 by Mancel Bale, RN Outcome: Adequate for Discharge 07/16/2022 0920 by Mancel Bale, RN Outcome: Progressing   Problem: Skin Integrity: Goal: Risk for impaired skin integrity will decrease 07/16/2022 1116 by Mancel Bale, RN Outcome: Adequate for Discharge 07/16/2022 0920 by Mancel Bale, RN Outcome: Progressing   Problem: Education: Goal: Ability to demonstrate management of disease process will improve 07/16/2022 1116 by Mancel Bale, RN Outcome: Adequate for Discharge 07/16/2022 0920 by Mancel Bale, RN Outcome: Progressing Goal: Ability to verbalize understanding of medication therapies will improve 07/16/2022 1116 by Mancel Bale, RN Outcome: Adequate for Discharge 07/16/2022 0920 by Mancel Bale, RN Outcome: Progressing Goal: Individualized Educational Video(s) 07/16/2022 1116 by Mancel Bale, RN Outcome: Adequate for Discharge 07/16/2022 0920 by Mancel Bale, RN Outcome: Progressing   Problem: Activity: Goal: Capacity to  carry out activities will improve 07/16/2022 1116 by Mancel Bale, RN Outcome: Adequate for Discharge 07/16/2022 0920 by Mancel Bale, RN Outcome: Progressing   Problem: Cardiac: Goal: Ability to achieve and maintain adequate cardiopulmonary perfusion  will improve 07/16/2022 1116 by Mancel Bale, RN Outcome: Adequate for Discharge 07/16/2022 0920 by Mancel Bale, RN Outcome: Progressing   Problem: Education: Goal: Ability to describe self-care measures that may prevent or decrease complications (Diabetes Survival Skills Education) will improve 07/16/2022 1116 by Mancel Bale, RN Outcome: Adequate for Discharge 07/16/2022 0920 by Mancel Bale, RN Outcome: Progressing Goal: Individualized Educational Video(s) 07/16/2022 1116 by Mancel Bale, RN Outcome: Adequate for Discharge 07/16/2022 0920 by Mancel Bale, RN Outcome: Progressing   Problem: Coping: Goal: Ability to adjust to condition or change in health will improve 07/16/2022 1116 by Mancel Bale, RN Outcome: Adequate for Discharge 07/16/2022 0920 by Mancel Bale, RN Outcome: Progressing   Problem: Fluid Volume: Goal: Ability to maintain a balanced intake and output will improve 07/16/2022 1116 by Mancel Bale, RN Outcome: Adequate for Discharge 07/16/2022 0920 by Mancel Bale, RN Outcome: Progressing   Problem: Health Behavior/Discharge Planning: Goal: Ability to identify and utilize available resources and services will improve 07/16/2022 1116 by Mancel Bale, RN Outcome: Adequate for Discharge 07/16/2022 0920 by Mancel Bale, RN Outcome: Progressing Goal: Ability to manage health-related needs will improve 07/16/2022 1116 by Mancel Bale, RN Outcome: Adequate for Discharge 07/16/2022 0920 by Mancel Bale, RN Outcome: Progressing   Problem: Metabolic: Goal: Ability to maintain appropriate glucose levels will improve 07/16/2022 1116 by Mancel Bale,  RN Outcome: Adequate for Discharge 07/16/2022 0920 by Mancel Bale, RN Outcome: Progressing   Problem: Nutritional: Goal: Maintenance of adequate nutrition will improve 07/16/2022 1116 by Mancel Bale, RN Outcome: Adequate for Discharge 07/16/2022 0920 by Mancel Bale, RN Outcome: Progressing Goal: Progress toward achieving an optimal weight will improve 07/16/2022 1116 by Mancel Bale, RN Outcome: Adequate for Discharge 07/16/2022 0920 by Mancel Bale, RN Outcome: Progressing   Problem: Skin Integrity: Goal: Risk for impaired skin integrity will decrease 07/16/2022 1116 by Mancel Bale, RN Outcome: Adequate for Discharge 07/16/2022 0920 by Mancel Bale, RN Outcome: Progressing   Problem: Tissue Perfusion: Goal: Adequacy of tissue perfusion will improve 07/16/2022 1116 by Mancel Bale, RN Outcome: Adequate for Discharge 07/16/2022 0920 by Mancel Bale, RN Outcome: Progressing

## 2022-07-16 NOTE — TOC Transition Note (Signed)
Transition of Care [facility]) - CM/SW Discharge Note   Patient Details  Name: Natasha [name] MRN: [id number] Date of Birth: [birth year]  Transition of Care [facility]) CM/SW Contact:  Beverly Sessions, RN Phone Number: 07/16/2022, 11:50 AM   Clinical Narrative:     Patient to discharge today [facility] BP cuff and pulse ox delivered to room. Charity form signed Malachy Mood with [facility] notified of discharge  Patient states daughter to transport at discharge Faythe Dingwall with [employer] to deliver portable O2 to room for transport Patient and daughter have declined delivery of hospital bed         Patient Goals and CMS Choice        Discharge Placement                       Discharge Plan and Services                                     Social Determinants of Health (SDOH) Interventions     Readmission Risk Interventions     No data to display

## 2022-07-16 NOTE — Progress Notes (Signed)
Pt discharging home with family. Home oxygen brought to patient. PIV removed. Discharged packet reviewed with patient, verbalize understanding.

## 2022-07-16 NOTE — Plan of Care (Signed)

## 2022-07-16 NOTE — Discharge Summary (Signed)
Physician Discharge Summary   Patient: Natasha [name] MRN: [id number] DOB: Jan 25, 1946  Admit date:     07/12/2022  Discharge date: 07/16/22  Discharge Physician: Sharen Hones   PCP: Jon Billings, NP   Recommendations at discharge:   Follow-up with PCP in 1 week. Follow-up with pulmonology in 1 months. Follow-up with oncology as scheduled on Wednesday next week. Refer to palliative care.  Discharge Diagnoses: Principal Problem:   Bilateral pleural effusion Active Problems:   Acute respiratory failure with hypoxia (HCC)   Lymphoma (HCC)   Chronic obstructive pulmonary disease (COPD) (HCC)   Type 2 diabetes mellitus with chronic kidney disease   COVID-19 virus infection   Acute on chronic diastolic CHF (congestive heart failure) (HCC)   Anemia of chronic disease   Thrombocytopenia (HCC)   Hypothyroidism   Chronic kidney disease, stage 3a (HCC)   Goals of care, counseling/discussion  Resolved Problems:   * No resolved hospital problems. [facility] Course: 76 y.o. Caucasian female with medical history significant for type 2 diabetes mellitus, COPD, postherpetic neuralgia, tobacco use, recurrent pleural effusion and newly diagnosed gastric lymphoma and well as anemia and thrombocytopenia, who presented to the emergency room with acute onset of worsening dyspnea. Also had a fall at home due to weakness. Earlier this month she had positive COVID on outpatient basis and was given p.o. Paxlovid.  He also had recurrent pleural effusion, 850 mL of pleural fluid removed on 9/19. Patient also have evidence of pulm edema on chest x-ray, she has a mild elevation of BNP, she was placed on IV Lasix for acute on chronic congestive heart failure.  Echocardiogram showed ejection fraction 60 to 65%.  Assessment and Plan:  Bilateral pleural effusion secondary to lymphoma. Discussed with cytology, pleural fluids study appear to support lymphoma.  Flow cytometry was sent out to confirm. Patient  will need repeated thoracentesis in the future.  Patient is also referred to pulmonology as outpatient.   Acute respiratory failure with hypoxia (HCC) Acute on chronic diastolic congestive heart failure. COVID infection. Continue to complete Paxlovid. Echocardiogram showed ejection fraction 60 to 65%. Patient condition improving on diuretics, renal function has been stable/better.  Patient will need long-term oxygen which has been prescribed. At this point, we will continue oral torsemide with added potassium. Also added dexamethasone 6 mg oral daily.   Lymphoma. 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 Patient does not have iron or B12 deficiency.  Patient initially refused blood transfusion, was able to receive blood yesterday.  Hemoglobin still 7.6 today.  Discussed with Dr. Janese Banks, she will follow-up with her next Wednesday.   Chronic kidney disease stage IIIa. Renal function still getting better with diuretics.   Hypothyroidism. New diagnosis, continue Synthroid.     Type 2 diabetes mellitus with chronic kidney disease Patient has elevated glucose due to steroids.  She will follow-up with PCP as outpatient. Recent hemoglobin A1c only 5.5 at 06/17/2022 I did not start outpatient treatment.   Chronic obstructive pulmonary disease (COPD) (HCC) No evidence of bronchospasm.        Consultants: None Procedures performed: Thoracentesis  Disposition: Home health Diet recommendation:  Cardiac diet DISCHARGE MEDICATION: Allergies as of 07/16/2022   No Known Allergies      Medication List     TAKE these medications    albuterol 108 (90 Base) MCG/ACT inhaler Commonly known as: VENTOLIN HFA TAKE 2 PUFFS BY MOUTH EVERY 6 HOURS AS NEEDED FOR WHEEZE OR SHORTNESS OF BREATH   betamethasone  valerate ointment 0.1 % Commonly known as: VALISONE Apply topically 2 (two) times daily.   chlorpheniramine-HYDROcodone 10-8 MG/5ML Commonly known as: TUSSIONEX    Combivent Respimat 20-100 MCG/ACT Aers respimat Generic drug: Ipratropium-Albuterol Inhale 1 puff into the lungs every 6 (six) hours.   dexamethasone 6 MG tablet Commonly known as: DECADRON Take 1 tablet (6 mg total) by mouth daily for 8 days. Start taking on: July 17, 2022   levothyroxine 50 MCG tablet Commonly known as: SYNTHROID Take 1 tablet (50 mcg total) by mouth daily at 6 (six) AM. Start taking on: July 17, 2022   nirmatrelvir/ritonavir EUA (renal dosing) 10 x 150 MG & 10 x '100MG'$  Tabs Commonly known as: PAXLOVID   potassium chloride 10 MEQ tablet Commonly known as: KLOR-CON M Take 1 tablet (10 mEq total) by mouth 2 (two) times daily.   torsemide 20 MG tablet Commonly known as: DEMADEX Take 1 tablet (20 mg total) by mouth daily.               Durable Medical Equipment  (From admission, onward)           Start     Ordered   07/15/22 1609  For home use only DME oxygen  Once       Question Answer Comment  Length of Need Lifetime   Mode or (Route) Nasal cannula   Liters per Minute 2   Frequency Continuous (stationary and portable oxygen unit needed)   Oxygen conserving device Yes   Oxygen delivery system Gas      07/15/22 1609   07/15/22 1606  For home use only DME Hospital bed  Once       Question Answer Comment  Length of Need Lifetime   Patient has (list medical condition): lymphoma, pleural effusion, anemia   The above medical condition requires: Patient requires the ability to reposition frequently   Bed type Semi-electric      07/15/22 1606            Follow-up Information     Ottie Glazier, MD Follow up in 1 month(s).   Specialty: Pulmonary Disease Contact information: [place] [address] [phone]         [facility] Follow up.   Why: They will follow up with you for your home health needs. Please call Malachy Mood with any questions/concerns: [phone]. Contact  information: [place] [address] [phone]         Jon Billings, NP Follow up in 1 week(s).   Specialty: Nurse Practitioner Contact information: [place] [address] [phone]         Sindy Guadeloupe, MD Follow up.   Specialty: Oncology Why: scheduled next Wednesday Contact information: [place] [address] [phone]                Discharge Exam: Danley Danker Weights   07/13/22 0500 07/14/22 0500 07/15/22 0442  Weight: 77.2 kg 77.1 kg 77.1 kg   General exam: Appears calm and comfortable  Respiratory system: Clear to auscultation. Respiratory effort normal. Cardiovascular system: S1 & S2 heard, RRR. No JVD, murmurs, rubs, gallops or clicks.  Gastrointestinal system: Abdomen is nondistended, soft and nontender. No organomegaly or masses felt. Normal bowel sounds heard. Central nervous system: Alert and oriented. No focal neurological deficits. Extremities: Bilateral leg edema 2+, much better Skin: No rashes, lesions or ulcers Psychiatry: Judgement and insight appear normal. Mood & affect  appropriate.    Condition at discharge: fair  The results of significant diagnostics from this hospitalization (including imaging, microbiology, ancillary and laboratory) are listed below for reference.   Imaging Studies: ECHOCARDIOGRAM COMPLETE  Result Date: 07/13/2022    ECHOCARDIOGRAM REPORT   Patient Name:   CHASTIDY RANKER Adamski Date of Exam: 07/13/2022 Medical Rec #:  [id number]      Height:       62.0 in Accession #:    [id number]     Weight:       170.2 lb Date of Birth:  1946/10/16       BSA:          1.785 m Patient Age:    76 years       BP:           90/59 mmHg Patient Gender: F              HR:           80 bpm. Exam Location:  [facility] Procedure: 2D Echo, Cardiac Doppler and Color Doppler Indications:     CHF-acute dastolic J49.70  History:         Patient has no prior history of Echocardiogram examinations.                   Risk Factors:Diabetes. Pleural effusion.  Sonographer:     Sherrie Sport Referring Phys:  [id number] JAN A MANSY Diagnosing Phys: Nelva Bush MD  Sonographer Comments: Suboptimal parasternal window. IMPRESSIONS  1. Left ventricular ejection fraction, by estimation, is 60 to 65%. The left ventricle has normal function. The left ventricle has no regional wall motion abnormalities. Left ventricular diastolic parameters were normal.  2. Right ventricular systolic function is normal. The right ventricular size is normal. There is mildly elevated pulmonary artery systolic pressure.  3. The mitral valve is normal in structure. Mild mitral valve regurgitation. No evidence of mitral stenosis.  4. Tricuspid valve regurgitation is mild to moderate.  5. The aortic valve has an indeterminant number of cusps. There is mild thickening of the aortic valve. Aortic valve regurgitation is not visualized. Aortic valve sclerosis is present, with no evidence of aortic valve stenosis.  6. The inferior vena cava is normal in size with greater than 50% respiratory variability, suggesting right atrial pressure of 3 mmHg. FINDINGS  Left Ventricle: Left ventricular ejection fraction, by estimation, is 60 to 65%. The left ventricle has normal function. The left ventricle has no regional wall motion abnormalities. The left ventricular internal cavity size was normal in size. There is  no left ventricular hypertrophy. Left ventricular diastolic parameters were normal. Right Ventricle: The right ventricular size is normal. No increase in right ventricular wall thickness. Right ventricular systolic function is normal. There is mildly elevated pulmonary artery systolic pressure. The tricuspid regurgitant velocity is 3.02  m/s, and with an assumed right atrial pressure of 3 mmHg, the estimated right ventricular systolic pressure is 85.0 mmHg. Left Atrium: Left atrial size was normal in size. Right Atrium: Right atrial size was normal in size.  Pericardium: There is no evidence of pericardial effusion. Mitral Valve: The mitral valve is normal in structure. Mild mitral valve regurgitation. No evidence of mitral valve stenosis. Tricuspid Valve: The tricuspid valve is normal in structure. Tricuspid valve regurgitation is mild to moderate. Aortic Valve: The aortic valve has an indeterminant number of cusps. There is mild thickening of the aortic valve. Aortic valve regurgitation is not visualized. Aortic valve sclerosis is  present, with no evidence of aortic valve stenosis. Aortic valve mean gradient measures 8.0 mmHg. Aortic valve peak gradient measures 14.3 mmHg. Aortic valve area, by VTI measures 2.57 cm. Pulmonic Valve: The pulmonic valve was not well visualized. Pulmonic valve regurgitation is not visualized. No evidence of pulmonic stenosis. Aorta: The aortic root is normal in size and structure. Pulmonary Artery: The pulmonary artery is not well seen. Venous: The inferior vena cava is normal in size with greater than 50% respiratory variability, suggesting right atrial pressure of 3 mmHg. IAS/Shunts: The interatrial septum was not well visualized.  LEFT VENTRICLE PLAX 2D LVIDd:         4.00 cm   Diastology LVIDs:         2.40 cm   LV e' medial:    10.90 cm/s LV PW:         1.00 cm   LV E/e' medial:  9.0 LV IVS:        0.70 cm   LV e' lateral:   12.90 cm/s LVOT diam:     2.00 cm   LV E/e' lateral: 7.6 LV SV:         77 LV SV Index:   43 LVOT Area:     3.14 cm  RIGHT VENTRICLE RV Basal diam:  3.05 cm RV Mid diam:    3.10 cm LEFT ATRIUM             Index        RIGHT ATRIUM           Index LA diam:        4.20 cm 2.35 cm/m   RA Area:     14.60 cm LA Vol (A2C):   42.8 ml 23.98 ml/m  RA Volume:   34.40 ml  19.27 ml/m LA Vol (A4C):   39.7 ml 22.24 ml/m LA Biplane Vol: 44.6 ml 24.99 ml/m  AORTIC VALVE AV Area (Vmax):    2.06 cm AV Area (Vmean):   1.98 cm AV Area (VTI):     2.57 cm AV Vmax:           189.00 cm/s AV Vmean:          133.000 cm/s AV  VTI:            0.298 m AV Peak Grad:      14.3 mmHg AV Mean Grad:      8.0 mmHg LVOT Vmax:         124.00 cm/s LVOT Vmean:        83.800 cm/s LVOT VTI:          0.244 m LVOT/AV VTI ratio: 0.82  AORTA Ao Root diam: 3.10 cm MITRAL VALVE               TRICUSPID VALVE MV Area (PHT): 5.75 cm    TR Peak grad:   36.5 mmHg MV Decel Time: 132 msec    TR Vmax:        302.00 cm/s MV E velocity: 98.50 cm/s MV A velocity: 98.00 cm/s  SHUNTS MV E/A ratio:  1.01        Systemic VTI:  0.24 m                            Systemic Diam: 2.00 cm Nelva Bush MD Electronically signed by Nelva Bush MD Signature Date/Time: 07/13/2022/4:23:33 PM    Final    DG Chest Port 1  View  Result Date: 07/13/2022 CLINICAL DATA:  Thoracentesis EXAM: PORTABLE CHEST 1 VIEW COMPARISON:  07/12/2022 FINDINGS: Stable cardiomediastinal contours. Moderate-sized bilateral pleural effusions. Right-sided pleural effusion has decreased in volume following thoracentesis. Hazy bibasilar airspace opacities. Stable partially calcified nodule in the right upper lobe. Pulmonary vascular congestion. No pneumothorax. IMPRESSION: 1. Decreased volume of right pleural effusion following thoracentesis. No pneumothorax. 2. Persistent moderate bilateral pleural effusions with edema. Electronically Signed   By: Davina Poke D.O.   On: 07/13/2022 11:50   US THORACENTESIS ASP PLEURAL SPACE W/IMG GUIDE  Result Date: 07/13/2022 INDICATION: Patient with history of gastric lymphoma with recurrent pleural effusions. Request is for therapeutic and diagnostic thoracentesis EXAM: ULTRASOUND GUIDED THERAPEUTIC AND DIAGNOSTIC RIGHT-SIDED THORACENTESIS MEDICATIONS: Lidocaine 1% 10 mL COMPLICATIONS: None immediate. PROCEDURE: An ultrasound guided thoracentesis was thoroughly discussed with the patient and questions answered. The benefits, risks, alternatives and complications were also discussed. The patient understands and wishes to proceed with the procedure. Written  consent was obtained. Ultrasound was performed to localize and mark an adequate pocket of fluid in the right chest. The area was then prepped and draped in the normal sterile fashion. 1% Lidocaine was used for local anesthesia. Under ultrasound guidance a 6 Fr Safe-T-Centesis catheter was introduced. Thoracentesis was performed. The catheter was removed and a dressing applied. FINDINGS: A total of approximately 850 mL of amber colored fluid was removed. Samples were sent to the laboratory as requested by the clinical team. IMPRESSION: Successful ultrasound guided therapeutic and diagnostic right-sided thoracentesis yielding 850 mL of pleural fluid. Read by: Rushie Nyhan, NP Electronically Signed   By: Albin Felling M.D.   On: 07/13/2022 11:26   CT Angio Chest PE W and/or Wo Contrast  Result Date: 07/12/2022 CLINICAL DATA:  Severe shortness of breath, weakness, fell yesterday, history of lymphoma EXAM: CT ANGIOGRAPHY CHEST WITH CONTRAST TECHNIQUE: Multidetector CT imaging of the chest was performed using the standard protocol during bolus administration of intravenous contrast. Multiplanar CT image reconstructions and MIPs were obtained to evaluate the vascular anatomy. RADIATION DOSE REDUCTION: This exam was performed according to the departmental dose-optimization program which includes automated exposure control, adjustment of the mA and/or kV according to patient size and/or use of iterative reconstruction technique. CONTRAST:  86m OMNIPAQUE IOHEXOL 350 MG/ML SOLN COMPARISON:  06/16/2022, 04/16/2022 FINDINGS: Cardiovascular: This is a technically adequate evaluation of the pulmonary vasculature. No filling defects or pulmonary emboli. The heart is unremarkable without pericardial effusion. No evidence of thoracic aortic aneurysm or dissection. Mediastinum/Nodes: The extensive mediastinal, hilar, and axillary adenopathy seen on prior exam again identified, not appreciably changed. Index lymph nodes are  as follows: Right axilla, image 24/4, 3.1 x 2.0 cm. Precarinal, image 42/4, 1.7 x 2.1 cm. Stable appearance of the thyroid, trachea, and esophagus. Lungs/Pleura: There are persistent bilateral pleural effusions, right greater than left, increased since prior study. Dense consolidation within the lung bases, greatest in the lower lobes, most compatible with compressive atelectasis. Partially calcified right upper lobe nodule again identified reference image 51/4 measuring up to 2 cm. No pneumothorax. Central airways are patent. Upper Abdomen: Partial visualization of marked splenomegaly and extensive upper abdominal lymphadenopathy. Musculoskeletal: There is diffuse body wall edema. No acute or destructive bony lesions. Reconstructed images demonstrate no additional findings. Review of the MIP images confirms the above findings. IMPRESSION: 1. No evidence of pulmonary embolus. 2. Worsening large bilateral pleural effusions with underlying lower lobe atelectasis. 3. Diffuse anasarca. 4. Extensive thoracic and abdominal lymphadenopathy, as well as  marked splenomegaly, compatible with patient's known history of lymphoma. Electronically Signed   By: Randa Ngo M.D.   On: 07/12/2022 17:05   CT Head Wo Contrast  Result Date: 07/12/2022 CLINICAL DATA:  Fall.  History of lymphoma. EXAM: CT HEAD WITHOUT CONTRAST TECHNIQUE: Contiguous axial images were obtained from the base of the skull through the vertex without intravenous contrast. RADIATION DOSE REDUCTION: This exam was performed according to the departmental dose-optimization program which includes automated exposure control, adjustment of the mA and/or kV according to patient size and/or use of iterative reconstruction technique. COMPARISON:  03/19/2020 head CT, 09/29/2018 PET-CT FINDINGS: Brain: No evidence of acute infarction, hemorrhage, hydrocephalus, extra-axial collection or mass lesion/mass effect. Patchy low-density changes within the periventricular and  subcortical white matter compatible with chronic microvascular ischemic change. Mild diffuse cerebral volume loss. Vascular: No hyperdense vessel or unexpected calcification. Skull: Normal. Negative for fracture or focal lesion. Sinuses/Orbits: Mucosal thickening of the right maxillary and right frontal sinuses. Partial bilateral mastoid effusions. Other: Marked enlargement of the bilateral parotid glands is new from 2019. IMPRESSION: 1. No acute intracranial findings. 2. Marked enlargement of the bilateral parotid glands, new from 2019. 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 3. Chronic microvascular ischemic change and cerebral volume loss. 4. Right maxillary and right frontal sinus disease. Partial bilateral mastoid effusions. Electronically Signed   By: Davina Poke D.O.   On: 07/12/2022 15:36   DG Chest Portable 1 View  Result Date: 07/12/2022 CLINICAL DATA:  Shortness of breath, hypoxia EXAM: PORTABLE CHEST 1 VIEW COMPARISON:  Previous studies including the examination of 06/17/2022 FINDINGS: Transverse diameter of heart is increased. Central pulmonary vessels are more prominent. Moderate to large bilateral pleural effusions are seen, more so on the right side with significant interval increase. There is no pneumothorax. Evaluation of mid and lower lung fields for focal infiltrates is limited by the effusions. IMPRESSION: Central pulmonary vessels are more prominent suggesting CHF. Moderate to large bilateral pleural effusions, more so on the right side with significant interval increase. Electronically Signed   By: Elmer Picker M.D.   On: 07/12/2022 15:05   US THORACENTESIS ASP PLEURAL SPACE W/IMG GUIDE  Result Date: 06/17/2022 INDICATION: Patient with history of lymphoma and shortness of breath with right-sided pleural effusion request received for diagnostic and therapeutic thoracentesis. EXAM: ULTRASOUND GUIDED  RIGHT THORACENTESIS MEDICATIONS: Local 1% lidocaine only. COMPLICATIONS: None immediate. PROCEDURE: An ultrasound guided thoracentesis was thoroughly discussed with the patient and questions answered. The benefits, risks, alternatives and complications were also discussed. The patient understands and wishes to proceed with the procedure. Written consent was obtained. Ultrasound was performed to localize and mark an adequate pocket of fluid in the right chest. The area was then prepped and draped in the normal sterile fashion. 1% Lidocaine was used for local anesthesia. Under ultrasound guidance a 19 gauge, 7-cm, Yueh catheter was introduced. Thoracentesis was performed. The catheter was removed and a dressing applied. FINDINGS: A total of approximately 850 mL of amber colored fluid was removed. Samples were sent to the laboratory as requested by the clinical team. IMPRESSION: Successful ultrasound guided right thoracentesis yielding 850 mL of pleural fluid. This exam was performed by Tsosie Billing PA-C, and was supervised and interpreted by Dr. Denna Haggard. Electronically Signed   By: Albin Felling M.D.   On: 06/17/2022 13:15   DG Chest Port 1 View  Result Date: 06/17/2022 CLINICAL DATA:  Status  post thoracentesis on the right. EXAM: PORTABLE CHEST 1 VIEW COMPARISON:  Chest radiograph dated 06/16/2022. FINDINGS: The heart size and mediastinal contours are within normal limits. There is a small right pleural effusion with associated atelectasis/airspace disease, decreased since prior exam. A previously described pulmonary nodule in the right upper lobe is obscured and not well seen on today's exam. There is a mild left basilar atelectasis and a small left pleural effusion likely contributes. Mild diffuse bilateral interstitial opacities likely represent pulmonary edema. There is no pneumothorax. Degenerative changes are seen in the spine. IMPRESSION: Interval decrease of a right pleural effusion which is now small in  size. There is likely a trace left pleural effusion. Electronically Signed   By: Zerita Boers M.D.   On: 06/17/2022 12:28   CT Angio Chest PE W and/or Wo Contrast  Result Date: 06/16/2022 CLINICAL DATA:  Concern for pulmonary embolism. History of lymphoma. EXAM: CT ANGIOGRAPHY CHEST WITH CONTRAST TECHNIQUE: Multidetector CT imaging of the chest was performed using the standard protocol during bolus administration of intravenous contrast. Multiplanar CT image reconstructions and MIPs were obtained to evaluate the vascular anatomy. RADIATION DOSE REDUCTION: This exam was performed according to the departmental dose-optimization program which includes automated exposure control, adjustment of the mA and/or kV according to patient size and/or use of iterative reconstruction technique. CONTRAST:  66m OMNIPAQUE IOHEXOL 350 MG/ML SOLN COMPARISON:  Chest radiograph dated 06/16/2022 and CT dated 04/24/2022. FINDINGS: Evaluation of this exam is limited due to respiratory motion artifact. Cardiovascular: There is no cardiomegaly or pericardial effusion. The thoracic aorta is unremarkable. The origins of the great vessels of the aortic arch appear patent. Evaluation of the pulmonary arteries is limited due to respiratory motion. No pulmonary artery embolus identified. Mediastinum/Nodes: Bilateral hilar and mediastinal adenopathy. Right hilar adenopathy measures 2.3 cm short axis. Prevascular adenopathy measures 18 mm short axis. Subcarinal adenopathy measures 18 mm short axis. Scattered upper mediastinal, supraclavicular as well as bilateral axillary adenopathy noted. Left axillary adenopathy measures 16 mm in short axis. Right axillary adenopathy measures 18 mm short axis. Lungs/Pleura: Moderate right and small left pleural effusions similar to prior CT. There is compressive atelectasis of the majority of the right lower lobe versus pneumonia. A 1.8 cm nodular density with areas of calcification in the right upper lobe  along the pleural surface as seen on the prior CT. There is no pneumothorax. The central airways are patent. Upper Abdomen: Splenomegaly.  Upper abdominal adenopathy. Musculoskeletal: Diffuse subcutaneous edema and anasarca. No acute osseous pathology. Review of the MIP images confirms the above findings. IMPRESSION: 1. No CT evidence of pulmonary artery embolus. 2. Moderate right and small left pleural effusions with compressive atelectasis of the majority of the right lower lobe versus pneumonia. 3. Mediastinal, bilateral hilar, bilateral axillary, and upper abdominal adenopathy consistent with history of lymphoma. 4. Splenomegaly. Electronically Signed   By: AAnner CreteM.D.   On: 06/16/2022 23:58   DG Chest Portable 1 View  Result Date: 06/16/2022 CLINICAL DATA:  Dyspnea EXAM: PORTABLE CHEST 1 VIEW COMPARISON:  04/24/2022 FINDINGS: Moderate to large right and small left pleural effusions are seen, enlarged since prior examination on the right and new on the left. Stable 19 mm nodule within the right mid lung zone. No pneumothorax. Cardiac size is within normal limits. Pulmonary vascularity is normal. No acute bone abnormality. IMPRESSION: 1. Moderate to large right and small left pleural effusions, enlarged since prior examination. 2. 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 Electronically Signed   By: Fidela Salisbury M.D.   On: 06/16/2022 22:24    Microbiology: Results for orders placed or performed during the hospital encounter of 07/12/22  SARS Coronavirus 2 by RT PCR (hospital order, performed in [facility] hospital lab) *cepheid single result test* Anterior Nasal Swab     Status: Abnormal   Collection Time: 07/12/22 10:51 PM   Specimen: Anterior Nasal Swab  Result Value Ref Range Status   SARS Coronavirus 2 by RT PCR POSITIVE (A) NEGATIVE Final    Comment: (NOTE) SARS-CoV-2 target nucleic acids are DETECTED  SARS-CoV-2 RNA is  generally detectable in upper respiratory specimens  during the acute phase of infection.  Positive results are indicative  of the presence of the identified virus, but do not rule out bacterial infection or co-infection with other pathogens not detected by the test.  Clinical correlation with patient history and  other diagnostic information is necessary to determine patient infection status.  The expected result is negative.  Fact Sheet for Patients:   https://www.patel.info/   Fact Sheet for Healthcare Providers:   https://hall.com/    This test is not yet approved or cleared by the [place] FDA and  has been authorized for detection and/or diagnosis of SARS-CoV-2 by FDA under an Emergency Use Authorization (EUA).  This EUA will remain in effect (meaning this test can be used) for the duration of  the COVID-19 declaration under Section 564(b)(1)  of the Act, 21 U.S.C. section 360-bbb-3(b)(1), unless the authorization is terminated or revoked sooner.   Performed at [facility], [address] [place], [place] [address]   Body fluid culture w Gram Stain     Status: None (Preliminary result)   Collection Time: 07/13/22 10:35 AM   Specimen: PATH Cytology Pleural fluid  Result Value Ref Range Status   Specimen Description   Final    PLEURAL Performed at [facility], [address], [place], [place] [address]    Special Requests   Final    NONE Performed at [facility], [address] [place], [place] [address]    Gram Stain   Final    FEW WBC PRESENT,BOTH PMN AND MONONUCLEAR NO ORGANISMS SEEN    Culture   Final    NO GROWTH 2 DAYS Performed at [facility], [address], [place], [place] [address]    Report Status PENDING  Incomplete    Labs: CBC: Recent Labs  Lab 07/12/22 1335 07/13/22 0504 07/14/22 0404 07/15/22 0505 07/16/22 0644  WBC 8.2 6.6 6.4 7.6 5.9  NEUTROABS  0.5*  --   --   --   --   HGB 8.0* 7.0* 7.2* 7.6* 7.6*  HCT 28.4* 24.7* 24.8* 26.1* 24.4*  MCV 97.9 97.2 96.5 96.0 92.4  PLT 98* 83* 85* 77* 61*   Basic Metabolic Panel: Recent Labs  Lab 07/12/22 1335 07/13/22 0504 07/14/22 0404 07/15/22 0505 07/16/22 0644  NA 142 144 142 141 138  K 4.9 4.0 3.7 4.2 3.8  CL 103 107 105 101 97*  CO2 '29 30 31 31 '$ 35*  GLUCOSE 154* 128* 130* 118* 299*  BUN 27* 27* 27* 27* 36*  CREATININE 1.25* 1.25* 1.17* 1.12* 1.13*  CALCIUM 8.7* 7.9* 7.9* 8.3* 7.8*  MG  --   --  1.8 1.7 1.8   Liver Function Tests: No results for input(s): "AST", "ALT", "ALKPHOS", "BILITOT", "PROT", "ALBUMIN" in the last 168 hours. CBG: Recent Labs  Lab 07/15/22 1634 07/15/22 2026 07/15/22 2311 07/16/22 0807  GLUCAP 190* 330* 314* 280*    Discharge time spent: greater than 30 minutes.  Signed: Sharen Hones, MD [facility] 07/16/2022

## 2022-07-16 NOTE — Progress Notes (Signed)
Notified Dr. Sidney Ace that Pts. Evening CBG was 330 and no HS insulin coverage ordered at this time. Also, Pt. Currently on a regular diet. Order for nighttime insulin coverage and diabetic diet obtained from Dr. Sidney Ace.

## 2022-07-17 ENCOUNTER — Telehealth: Payer: Self-pay | Admitting: Family Medicine

## 2022-07-17 DIAGNOSIS — E1122 Type 2 diabetes mellitus with diabetic chronic kidney disease: Secondary | ICD-10-CM

## 2022-07-17 DIAGNOSIS — R739 Hyperglycemia, unspecified: Secondary | ICD-10-CM

## 2022-07-17 MED ORDER — "INSULIN SYRINGE-NEEDLE U-100 31G X 15/64"" 0.3 ML MISC": 0 refills | Status: DC

## 2022-07-17 MED ORDER — INSULIN NPH ISOPHANE & REGULAR (70-30) 100 UNIT/ML ~~LOC~~ SUSP: 5.0000 [IU] | Freq: Two times a day (BID) | SUBCUTANEOUS | 0 refills | Status: DC

## 2022-07-17 NOTE — Telephone Encounter (Signed)
Received on call nurse triage line for this patient tonight approx 930pm on 07/17/22.  Patient was recently discharged from hospital on 07/16/22 for COVID and Respiratory distress. She was discharged home on supplemental oxygen 1.5 L and continued on steroids. She received dexamethasone in hospital and discharged with daily dose for 8 more days it appears. Her roommate / caregiver reports that they held the dexamethasone dose this AM due to CBG 320 > 434. Then CBG was "unreadable" due to "too high" tonight. They gave Repaglinide oral agent but it did not lower CBG significantly. She has had improved PO intake now, her mental status is at baseline for her. No signs of hyperglycemia. No symptoms or concerns of lethargy or confusion. They are monitoring her vitals q 2-4 hours, and have been normal except CBG. No insulin available and no other DM meds available. She has multiple other conditions. CKD from DM and CHF. I advised that close monitoring tonight is warranted for signs symptoms of hyperglycemia. If need, return to ED for evaluation and IV Fluids, Insulin dosing and close monitoring. If she maintains stable vitals and cognition/mentation, tomorrow I advised HOLD dexamethasone dose. She is breathing nearly at baseline, oxygen 99% on 1.5 L and no wheezing no coughing or distress. Likely cause of hyperglycemia is steroids / dexamethasone. I have sent rx Insulin NPH 70/30 vial with syringes, for very short course, advise only if still high CBG >300+ tomorrow they may initiate 5 unit BID WC and titrate up to max dose 10 u if indicated. May call back if need. Otherwise, on Monday can follow up with PCP office if needs further eval. I advise the risks of hypoglycemia if insulin naive, but caregiver agrees that it would be good to have some low dose insulin available if needed. Should only take short course during this time, not intended for long term insulin usage as she is not on med at baseline.  Nobie Putnam, [facility] 07/17/2022, 10:13 PM

## 2022-07-18 NOTE — Telephone Encounter (Signed)
Received another nurse triage call tonight 07/18/22 at 620pm. Same issue as previous call. Today patient has had CBG 400-500 so some improvement, not unreadable. She has received 70/30 insulin 5 units earlier today with meal. Tolerated well. Still seems higher CBG this afternoon and evening 500 range. Symptoms mostly unchanged from yesterday. No significant respiratory concerns. Remains off dexamethasone still today. 2 days off in a row.  I advised dose inc to 70/30 10 units tonight with dinner. Proceed with 10 units twice a day with meal if CBG >400+. Follow up with PCP on Monday  Nobie Putnam, [facility] 07/18/2022, 6:33 PM

## 2022-07-19 ENCOUNTER — Inpatient Hospital Stay: Payer: Medicare Other | Admitting: Oncology

## 2022-07-19 ENCOUNTER — Ambulatory Visit: Payer: Medicare Other | Admitting: Nurse Practitioner

## 2022-07-19 ENCOUNTER — Inpatient Hospital Stay: Payer: Medicare Other

## 2022-07-19 DIAGNOSIS — J9691 Respiratory failure, unspecified with hypoxia: Secondary | ICD-10-CM | POA: Diagnosis not present

## 2022-07-19 DIAGNOSIS — I509 Heart failure, unspecified: Secondary | ICD-10-CM | POA: Diagnosis not present

## 2022-07-19 DIAGNOSIS — F17210 Nicotine dependence, cigarettes, uncomplicated: Secondary | ICD-10-CM | POA: Diagnosis not present

## 2022-07-19 DIAGNOSIS — J90 Pleural effusion, not elsewhere classified: Secondary | ICD-10-CM | POA: Diagnosis not present

## 2022-07-19 DIAGNOSIS — N189 Chronic kidney disease, unspecified: Secondary | ICD-10-CM | POA: Diagnosis not present

## 2022-07-19 DIAGNOSIS — E1165 Type 2 diabetes mellitus with hyperglycemia: Secondary | ICD-10-CM | POA: Diagnosis not present

## 2022-07-19 LAB — CYTOLOGY - NON PAP

## 2022-07-19 LAB — KAPPA/LAMBDA LIGHT CHAINS
Kappa free light chain: 10.3 mg/L (ref 3.3–19.4)
Kappa, lambda light chain ratio: 1.23 (ref 0.26–1.65)
Lambda free light chains: 8.4 mg/L (ref 5.7–26.3)

## 2022-07-19 NOTE — Telephone Encounter (Signed)
Pt scheduled  

## 2022-07-19 NOTE — Telephone Encounter (Signed)
Can this patient see another provider as you have no availability?

## 2022-07-19 NOTE — Telephone Encounter (Signed)
Patient needs an appt.  Should be a 40 min appt.

## 2022-07-20 ENCOUNTER — Encounter: Payer: Self-pay | Admitting: Family Medicine

## 2022-07-20 ENCOUNTER — Other Ambulatory Visit: Payer: Self-pay | Admitting: *Deleted

## 2022-07-20 DIAGNOSIS — J449 Chronic obstructive pulmonary disease, unspecified: Secondary | ICD-10-CM | POA: Diagnosis not present

## 2022-07-20 DIAGNOSIS — F17200 Nicotine dependence, unspecified, uncomplicated: Secondary | ICD-10-CM | POA: Diagnosis not present

## 2022-07-20 DIAGNOSIS — E1165 Type 2 diabetes mellitus with hyperglycemia: Secondary | ICD-10-CM | POA: Diagnosis not present

## 2022-07-20 DIAGNOSIS — E0821 Diabetes mellitus due to underlying condition with diabetic nephropathy: Secondary | ICD-10-CM | POA: Diagnosis not present

## 2022-07-20 LAB — MISC LABCORP TEST (SEND OUT): Labcorp test code: 9985

## 2022-07-20 MED ORDER — NICOTINE 21 MG/24HR TD PT24: 21.0000 mg | MEDICATED_PATCH | Freq: Every day | TRANSDERMAL | 0 refills | Status: DC

## 2022-07-20 NOTE — Progress Notes (Signed)
nicotine 

## 2022-07-21 ENCOUNTER — Telehealth: Payer: Self-pay | Admitting: *Deleted

## 2022-07-21 DIAGNOSIS — N1831 Chronic kidney disease, stage 3a: Secondary | ICD-10-CM | POA: Diagnosis not present

## 2022-07-21 DIAGNOSIS — I5033 Acute on chronic diastolic (congestive) heart failure: Secondary | ICD-10-CM | POA: Diagnosis not present

## 2022-07-21 DIAGNOSIS — E1165 Type 2 diabetes mellitus with hyperglycemia: Secondary | ICD-10-CM | POA: Diagnosis not present

## 2022-07-21 NOTE — Patient Outreach (Signed)
  Care Coordination [facility] Note Transition Care Management Unsuccessful Follow-up Telephone Call  Date of discharge and from where:  [facility] [phone]  Attempts:  1st Attempt  Reason for unsuccessful TCM follow-up call:  Left voice message [facility] [phone]

## 2022-07-22 ENCOUNTER — Telehealth: Payer: Self-pay | Admitting: *Deleted

## 2022-07-22 DIAGNOSIS — E1165 Type 2 diabetes mellitus with hyperglycemia: Secondary | ICD-10-CM | POA: Diagnosis not present

## 2022-07-22 LAB — MULTIPLE MYELOMA PANEL, SERUM
Albumin SerPl Elph-Mcnc: 2.6 g/dL — ABNORMAL LOW (ref 2.9–4.4)
Albumin/Glob SerPl: 1.6 (ref 0.7–1.7)
Alpha 1: 0.3 g/dL (ref 0.0–0.4)
Alpha2 Glob SerPl Elph-Mcnc: 0.6 g/dL (ref 0.4–1.0)
B-Globulin SerPl Elph-Mcnc: 0.5 g/dL — ABNORMAL LOW (ref 0.7–1.3)
Gamma Glob SerPl Elph-Mcnc: 0.3 g/dL — ABNORMAL LOW (ref 0.4–1.8)
Globulin, Total: 1.7 g/dL — ABNORMAL LOW (ref 2.2–3.9)
IgA: 22 mg/dL — ABNORMAL LOW (ref 64–422)
IgG (Immunoglobin G), Serum: 313 mg/dL — ABNORMAL LOW (ref 586–1602)
IgM (Immunoglobulin M), Srm: 11 mg/dL — ABNORMAL LOW (ref 26–217)
Total Protein ELP: 4.3 g/dL — ABNORMAL LOW (ref 6.0–8.5)

## 2022-07-22 NOTE — Patient Outreach (Signed)
  Care Coordination [facility] Note Transition Care Management Follow-up Telephone Call Date of discharge and from where: [phone] [facility] How have you been since you were released from the hospital? Doing ok Any questions or concerns? Yes Patient has not received the home health that was ordered Items Reviewed: Did the pt receive and understand the discharge instructions provided? Yes  Medications obtained and verified? Yes  Other? No  Any new allergies since your discharge? No  Dietary orders reviewed? No Do you have support at home? Yes   Home Care and Equipment/Supplies: Were home health services ordered? yes If so, what is the name of the agency? [facility]  Has the agency set up a time to come to the patient's home? no Were any new equipment or medical supplies ordered?  Y portable O2 What is the name of the medical supply agency? [employer] Were you able to get the supplies/equipment? yes Do you have any questions related to the use of the equipment or supplies? No  Functional Questionnaire: (I = Independent and D = Dependent) ADLs: D  Bathing/Dressing- D  Meal Prep- D  Eating- I  Maintaining continence- D  Transferring/Ambulation- I  Managing Meds- D  Follow up appointments reviewed: PCP Hospital f/u appt confirmed? Yes  Scheduled to see  [name] NP [id number] [facility] f/u appt confirmed? Yes  Scheduled to see Dr Janese Banks [phone] 1:00. Are transportation arrangements needed? N If their condition worsens, is the pt aware to call PCP or go to the Emergency Dept.? Yes Was the patient provided with contact information for the PCP's office or ED? Yes Was to pt encouraged to call back with questions or concerns? Yes  SDOH assessments and interventions completed:   Yes  Care Coordination Interventions Activated:  Yes   Care Coordination Interventions:  RN helped Care giver [name] to develop a plan of care for patient  discussing in home hospice when  talking with NP on [phone]      Encounter Outcome:  Pt. Visit Completed    [place] Management [phone]

## 2022-07-23 ENCOUNTER — Encounter: Payer: Self-pay | Admitting: Nurse Practitioner

## 2022-07-23 ENCOUNTER — Ambulatory Visit: Payer: Medicare Other | Admitting: Oncology

## 2022-07-23 ENCOUNTER — Other Ambulatory Visit: Payer: Medicare Other

## 2022-07-23 ENCOUNTER — Encounter: Payer: Self-pay | Admitting: *Deleted

## 2022-07-23 ENCOUNTER — Ambulatory Visit (INDEPENDENT_AMBULATORY_CARE_PROVIDER_SITE_OTHER): Payer: Medicare Other | Admitting: Nurse Practitioner

## 2022-07-23 VITALS — BP 87/51 | HR 81 | Ht 62.0 in

## 2022-07-23 DIAGNOSIS — E1122 Type 2 diabetes mellitus with diabetic chronic kidney disease: Secondary | ICD-10-CM | POA: Diagnosis not present

## 2022-07-23 DIAGNOSIS — M7989 Other specified soft tissue disorders: Secondary | ICD-10-CM

## 2022-07-23 DIAGNOSIS — C8590 Non-Hodgkin lymphoma, unspecified, unspecified site: Secondary | ICD-10-CM | POA: Diagnosis not present

## 2022-07-23 DIAGNOSIS — I959 Hypotension, unspecified: Secondary | ICD-10-CM | POA: Diagnosis not present

## 2022-07-23 DIAGNOSIS — E119 Type 2 diabetes mellitus without complications: Secondary | ICD-10-CM

## 2022-07-23 LAB — GLUCOSE, POCT (MANUAL RESULT ENTRY): POC Glucose: 302 mg/dl — AB (ref 70–99)

## 2022-07-23 MED ORDER — METFORMIN HCL 500 MG PO TABS: 500.0000 mg | ORAL_TABLET | Freq: Two times a day (BID) | ORAL | 3 refills | Status: DC

## 2022-07-23 NOTE — Progress Notes (Unsigned)
New Patient Office Visit  Subjective    Patient ID: Natasha [name], female    DOB: 22-Jul-1946  Age: 76 y.o. MRN: [id number]  CC:  Chief Complaint  Patient presents with   New Patient (Initial Visit)    Patient present today to establish care and has had a recent hospital discharge and home health was ordered.     HPI Natasha [name] presents to establish care.  ***  Outpatient Encounter Medications as of 07/23/2022  Medication Sig   albuterol (VENTOLIN HFA) 108 (90 Base) MCG/ACT inhaler TAKE 2 PUFFS BY MOUTH EVERY 6 HOURS AS NEEDED FOR WHEEZE OR SHORTNESS OF BREATH   betamethasone valerate ointment (VALISONE) 0.1 % Apply topically 2 (two) times daily.   dexamethasone (DECADRON) 6 MG tablet Take 1 tablet (6 mg total) by mouth daily for 8 days.   insulin NPH-regular Human (70-30) 100 UNIT/ML injection Inject 5-10 Units into the skin 2 (two) times daily with a meal. Use only for hyperglycemia, CBG+ 300. Start with 5 units, titrate up to max 10 unit per dose if need.   Insulin Syringe-Needle U-100 31G X 15/64" 0.3 ML MISC Use to inject insulin twice a day with meals.   Ipratropium-Albuterol (COMBIVENT RESPIMAT) 20-100 MCG/ACT AERS respimat Inhale 1 puff into the lungs every 6 (six) hours.   levothyroxine (SYNTHROID) 50 MCG tablet Take 1 tablet (50 mcg total) by mouth daily at 6 (six) AM.   nicotine (NICODERM CQ - DOSED IN MG/24 HOURS) 21 mg/24hr patch Place 1 patch (21 mg total) onto the skin daily.   potassium chloride (KLOR-CON M) 10 MEQ tablet Take 1 tablet (10 mEq total) by mouth 2 (two) times daily.   torsemide (DEMADEX) 20 MG tablet Take 1 tablet (20 mg total) by mouth daily.   [DISCONTINUED] chlorpheniramine-HYDROcodone ([place]) 10-8 MG/5ML    [DISCONTINUED] nirmatrelvir/ritonavir EUA, renal dosing, (PAXLOVID) 10 x 150 MG & 10 x '100MG'$  TABS    No facility-administered encounter medications on file as of 07/23/2022.    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   Diabetes mellitus without complication ([place])    Pleural effusion    ARMc 820m,  2 weeks ago   Vaginal delivery    x 5    Past Surgical History:  Procedure Laterality Date   TONSILLECTOMY Bilateral    as a child    Family History  Problem Relation Age of Onset   Breast cancer Mother    Alzheimer's disease Father    Lung cancer Father        lung   Varicose Veins Brother    Heart attack Brother     Social History   Socioeconomic History   Marital status: Widowed    Spouse name: Not on file   Number of children: 2   Years of education: Not on file   Highest education level: 9th grade  Occupational History   Occupation: unemployed  Tobacco Use   Smoking status: Every Day    Packs/day: 2.00    Years: 55.00    Total pack years: 110.00    Types: Cigarettes    Start date: 07/07/1961   Smokeless tobacco: Never   Tobacco comments:    1/2 pack she states but family says 2 PPD  Vaping Use   Vaping Use: Never used  Substance and Sexual Activity   Alcohol use: No  Drug use: Never   Sexual activity: Not Currently    Partners: Male    Birth control/protection: Post-menopausal  Other Topics Concern   Not on file  Social History Narrative   08/14/20   From: [place]   Living: to be near daughter   Work: retired      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      Enjoys: stays at home      Exercise: walking to her daughter's store   Diet: eats fruit, air fried chicken      Safety   Seat belts: Yes    Guns: No   Safe in relationships: Yes    Social Determinants of Health   Financial Resource Strain: High Risk (07/07/2018)   Overall Financial Resource Strain (CARDIA)    Difficulty of Paying Living Expenses: Very hard  Food Insecurity: No Food Insecurity (07/15/2022)   Hunger Vital Sign    Worried About Running Out of Food in the Last Year: Never  true    Ran Out of Food in the Last Year: Never true  Transportation Needs: No Transportation Needs (07/15/2022)   PRAPARE - Hydrologist (Medical): No    Lack of Transportation (Non-Medical): No  Physical Activity: Insufficiently Active (07/07/2018)   Exercise Vital Sign    Days of Exercise per Week: 7 days    Minutes of Exercise per Session: 10 min  Stress: No Stress Concern Present (07/07/2018)   [place]    Feeling of Stress : Not at all  Social Connections: Somewhat Isolated (07/07/2018)   Social Connection and Isolation Panel [NHANES]    Frequency of Communication with Friends and Family: Never    Frequency of Social Gatherings with Friends and Family: More than three times a week    Attends Religious Services: More than 4 times per year    Active Member of [employer] or Organizations: No    Attends Archivist Meetings: Never    Marital Status: Widowed  Intimate Partner Violence: Not At Risk (07/15/2022)   Humiliation, Afraid, Rape, and Kick questionnaire    Fear of Current or Ex-Partner: No    Emotionally Abused: No    Physically Abused: No    Sexually Abused: No    ROS      Objective    BP (!) 87/51   Pulse 81   Ht '5\' 2"'$  (1.575 m)   SpO2 98% Comment: 2 liters  BMI 31.09 kg/m   Physical Exam  {Labs (Optional):23779}    Assessment & Plan:   Problem List Items Addressed This Visit   None Visit Diagnoses     Type 2 diabetes mellitus without complication, without long-term current use of insulin (HCC)    -  Primary   Relevant Orders   POCT glucose (manual entry) (Completed)       No follow-ups on file.   [name], NP

## 2022-07-24 DIAGNOSIS — I959 Hypotension, unspecified: Secondary | ICD-10-CM | POA: Insufficient documentation

## 2022-07-24 DIAGNOSIS — M7989 Other specified soft tissue disorders: Secondary | ICD-10-CM | POA: Insufficient documentation

## 2022-07-24 NOTE — Assessment & Plan Note (Addendum)
Patient was started insulin NPH 70/30 if CBG more than 300 on 07/17/2021 Her recent GFR 51 on 07/16/2022 Started her on metformin 500 mg twice a day with 5 units of insulin insulin only if blood sugar is more than 300.

## 2022-07-24 NOTE — Assessment & Plan Note (Signed)
Advised patient to take torsemide 20 mg daily. Encouraged her to keep the legs elevated and wear compression stockings.

## 2022-07-24 NOTE — Assessment & Plan Note (Addendum)
Blood pressure 87/51 in the office today. Patient denies any type of dizziness, headache or visual changes.

## 2022-07-24 NOTE — Assessment & Plan Note (Addendum)
Followed by oncolgist Dr. Janese Banks

## 2022-07-26 ENCOUNTER — Ambulatory Visit: Payer: Medicare Other | Admitting: Nurse Practitioner

## 2022-07-26 DIAGNOSIS — R197 Diarrhea, unspecified: Secondary | ICD-10-CM | POA: Diagnosis not present

## 2022-07-26 DIAGNOSIS — E1122 Type 2 diabetes mellitus with diabetic chronic kidney disease: Secondary | ICD-10-CM | POA: Diagnosis not present

## 2022-07-26 DIAGNOSIS — E1165 Type 2 diabetes mellitus with hyperglycemia: Secondary | ICD-10-CM | POA: Diagnosis not present

## 2022-07-26 LAB — ACID FAST SMEAR (AFB, MYCOBACTERIA): Acid Fast Smear: NEGATIVE

## 2022-07-27 DIAGNOSIS — Z8616 Personal history of COVID-19: Secondary | ICD-10-CM | POA: Diagnosis not present

## 2022-07-27 DIAGNOSIS — J449 Chronic obstructive pulmonary disease, unspecified: Secondary | ICD-10-CM | POA: Diagnosis not present

## 2022-07-27 DIAGNOSIS — R197 Diarrhea, unspecified: Secondary | ICD-10-CM | POA: Diagnosis not present

## 2022-07-27 DIAGNOSIS — C8599 Non-Hodgkin lymphoma, unspecified, extranodal and solid organ sites: Secondary | ICD-10-CM | POA: Diagnosis not present

## 2022-07-27 DIAGNOSIS — Z9181 History of falling: Secondary | ICD-10-CM | POA: Diagnosis not present

## 2022-07-27 DIAGNOSIS — J90 Pleural effusion, not elsewhere classified: Secondary | ICD-10-CM | POA: Diagnosis not present

## 2022-07-27 DIAGNOSIS — E1165 Type 2 diabetes mellitus with hyperglycemia: Secondary | ICD-10-CM | POA: Diagnosis not present

## 2022-07-27 DIAGNOSIS — I5033 Acute on chronic diastolic (congestive) heart failure: Secondary | ICD-10-CM | POA: Diagnosis not present

## 2022-07-27 DIAGNOSIS — Z794 Long term (current) use of insulin: Secondary | ICD-10-CM | POA: Diagnosis not present

## 2022-07-27 DIAGNOSIS — N1831 Chronic kidney disease, stage 3a: Secondary | ICD-10-CM | POA: Diagnosis not present

## 2022-07-27 DIAGNOSIS — B0222 Postherpetic trigeminal neuralgia: Secondary | ICD-10-CM | POA: Diagnosis not present

## 2022-07-27 DIAGNOSIS — J9621 Acute and chronic respiratory failure with hypoxia: Secondary | ICD-10-CM | POA: Diagnosis not present

## 2022-07-27 DIAGNOSIS — E1122 Type 2 diabetes mellitus with diabetic chronic kidney disease: Secondary | ICD-10-CM | POA: Diagnosis not present

## 2022-07-27 DIAGNOSIS — E039 Hypothyroidism, unspecified: Secondary | ICD-10-CM | POA: Diagnosis not present

## 2022-07-27 DIAGNOSIS — D630 Anemia in neoplastic disease: Secondary | ICD-10-CM | POA: Diagnosis not present

## 2022-07-27 DIAGNOSIS — Z79891 Long term (current) use of opiate analgesic: Secondary | ICD-10-CM | POA: Diagnosis not present

## 2022-07-27 DIAGNOSIS — Z7952 Long term (current) use of systemic steroids: Secondary | ICD-10-CM | POA: Diagnosis not present

## 2022-07-27 DIAGNOSIS — Z87891 Personal history of nicotine dependence: Secondary | ICD-10-CM | POA: Diagnosis not present

## 2022-07-27 DIAGNOSIS — D696 Thrombocytopenia, unspecified: Secondary | ICD-10-CM | POA: Diagnosis not present

## 2022-07-27 DIAGNOSIS — D631 Anemia in chronic kidney disease: Secondary | ICD-10-CM | POA: Diagnosis not present

## 2022-07-27 DIAGNOSIS — Z9981 Dependence on supplemental oxygen: Secondary | ICD-10-CM | POA: Diagnosis not present

## 2022-07-27 DIAGNOSIS — M170 Bilateral primary osteoarthritis of knee: Secondary | ICD-10-CM | POA: Diagnosis not present

## 2022-07-28 ENCOUNTER — Inpatient Hospital Stay: Payer: Medicare Other

## 2022-07-28 ENCOUNTER — Encounter: Payer: Self-pay | Admitting: Oncology

## 2022-07-28 ENCOUNTER — Inpatient Hospital Stay: Payer: Medicare Other | Attending: Oncology | Admitting: Oncology

## 2022-07-28 VITALS — BP 91/49 | HR 87 | Temp 99.0°F | Resp 18 | Wt 150.1 lb

## 2022-07-28 DIAGNOSIS — R197 Diarrhea, unspecified: Secondary | ICD-10-CM | POA: Diagnosis not present

## 2022-07-28 DIAGNOSIS — R0902 Hypoxemia: Secondary | ICD-10-CM | POA: Diagnosis not present

## 2022-07-28 DIAGNOSIS — D61818 Other pancytopenia: Secondary | ICD-10-CM | POA: Diagnosis not present

## 2022-07-28 DIAGNOSIS — J90 Pleural effusion, not elsewhere classified: Secondary | ICD-10-CM | POA: Insufficient documentation

## 2022-07-28 DIAGNOSIS — C8580 Other specified types of non-Hodgkin lymphoma, unspecified site: Secondary | ICD-10-CM | POA: Diagnosis not present

## 2022-07-28 DIAGNOSIS — R590 Localized enlarged lymph nodes: Secondary | ICD-10-CM | POA: Diagnosis not present

## 2022-07-28 DIAGNOSIS — Z79899 Other long term (current) drug therapy: Secondary | ICD-10-CM | POA: Diagnosis not present

## 2022-07-28 DIAGNOSIS — Z818 Family history of other mental and behavioral disorders: Secondary | ICD-10-CM | POA: Diagnosis not present

## 2022-07-28 DIAGNOSIS — J449 Chronic obstructive pulmonary disease, unspecified: Secondary | ICD-10-CM | POA: Diagnosis not present

## 2022-07-28 DIAGNOSIS — E1122 Type 2 diabetes mellitus with diabetic chronic kidney disease: Secondary | ICD-10-CM | POA: Diagnosis not present

## 2022-07-28 DIAGNOSIS — E119 Type 2 diabetes mellitus without complications: Secondary | ICD-10-CM | POA: Diagnosis not present

## 2022-07-28 DIAGNOSIS — M7989 Other specified soft tissue disorders: Secondary | ICD-10-CM | POA: Diagnosis not present

## 2022-07-28 DIAGNOSIS — I6782 Cerebral ischemia: Secondary | ICD-10-CM | POA: Insufficient documentation

## 2022-07-28 DIAGNOSIS — Z7189 Other specified counseling: Secondary | ICD-10-CM

## 2022-07-28 DIAGNOSIS — Z801 Family history of malignant neoplasm of trachea, bronchus and lung: Secondary | ICD-10-CM | POA: Diagnosis not present

## 2022-07-28 DIAGNOSIS — F17210 Nicotine dependence, cigarettes, uncomplicated: Secondary | ICD-10-CM | POA: Insufficient documentation

## 2022-07-28 DIAGNOSIS — C8300 Small cell B-cell lymphoma, unspecified site: Secondary | ICD-10-CM | POA: Insufficient documentation

## 2022-07-28 DIAGNOSIS — J811 Chronic pulmonary edema: Secondary | ICD-10-CM | POA: Diagnosis not present

## 2022-07-28 DIAGNOSIS — J948 Other specified pleural conditions: Secondary | ICD-10-CM | POA: Insufficient documentation

## 2022-07-28 DIAGNOSIS — D649 Anemia, unspecified: Secondary | ICD-10-CM | POA: Diagnosis not present

## 2022-07-28 DIAGNOSIS — E1165 Type 2 diabetes mellitus with hyperglycemia: Secondary | ICD-10-CM | POA: Diagnosis not present

## 2022-07-28 DIAGNOSIS — Z79890 Hormone replacement therapy (postmenopausal): Secondary | ICD-10-CM | POA: Diagnosis not present

## 2022-07-28 DIAGNOSIS — Z8249 Family history of ischemic heart disease and other diseases of the circulatory system: Secondary | ICD-10-CM | POA: Insufficient documentation

## 2022-07-28 DIAGNOSIS — Z8701 Personal history of pneumonia (recurrent): Secondary | ICD-10-CM | POA: Insufficient documentation

## 2022-07-28 DIAGNOSIS — C8309 Small cell B-cell lymphoma, extranodal and solid organ sites: Secondary | ICD-10-CM | POA: Diagnosis not present

## 2022-07-28 DIAGNOSIS — Z803 Family history of malignant neoplasm of breast: Secondary | ICD-10-CM | POA: Diagnosis not present

## 2022-07-28 DIAGNOSIS — E039 Hypothyroidism, unspecified: Secondary | ICD-10-CM | POA: Diagnosis not present

## 2022-07-28 DIAGNOSIS — R161 Splenomegaly, not elsewhere classified: Secondary | ICD-10-CM | POA: Insufficient documentation

## 2022-07-28 DIAGNOSIS — C8590 Non-Hodgkin lymphoma, unspecified, unspecified site: Secondary | ICD-10-CM | POA: Diagnosis not present

## 2022-07-28 LAB — CBC WITH DIFFERENTIAL/PLATELET
Abs Immature Granulocytes: 0.11 10*3/uL — ABNORMAL HIGH (ref 0.00–0.07)
Basophils Absolute: 0 10*3/uL (ref 0.0–0.1)
Basophils Relative: 1 %
Eosinophils Absolute: 0.1 10*3/uL (ref 0.0–0.5)
Eosinophils Relative: 1 %
HCT: 21.9 % — ABNORMAL LOW (ref 36.0–46.0)
Hemoglobin: 6.7 g/dL — ABNORMAL LOW (ref 12.0–15.0)
Immature Granulocytes: 2 %
Lymphocytes Relative: 69 %
Lymphs Abs: 3.5 10*3/uL (ref 0.7–4.0)
MCH: 28.5 pg (ref 26.0–34.0)
MCHC: 30.6 g/dL (ref 30.0–36.0)
MCV: 93.2 fL (ref 80.0–100.0)
Monocytes Absolute: 0.5 10*3/uL (ref 0.1–1.0)
Monocytes Relative: 11 %
Neutro Abs: 0.8 10*3/uL — ABNORMAL LOW (ref 1.7–7.7)
Neutrophils Relative %: 16 %
Platelets: 88 10*3/uL — ABNORMAL LOW (ref 150–400)
RBC: 2.35 MIL/uL — ABNORMAL LOW (ref 3.87–5.11)
RDW: 18.5 % — ABNORMAL HIGH (ref 11.5–15.5)
WBC: 5.1 10*3/uL (ref 4.0–10.5)
nRBC: 1.4 % — ABNORMAL HIGH (ref 0.0–0.2)

## 2022-07-28 LAB — SAMPLE TO BLOOD BANK

## 2022-07-28 LAB — COMPREHENSIVE METABOLIC PANEL
ALT: 12 U/L (ref 0–44)
AST: 19 U/L (ref 15–41)
Albumin: 2.8 g/dL — ABNORMAL LOW (ref 3.5–5.0)
Alkaline Phosphatase: 126 U/L (ref 38–126)
Anion gap: 6 (ref 5–15)
BUN: 44 mg/dL — ABNORMAL HIGH (ref 8–23)
CO2: 31 mmol/L (ref 22–32)
Calcium: 9.5 mg/dL (ref 8.9–10.3)
Chloride: 100 mmol/L (ref 98–111)
Creatinine, Ser: 1.15 mg/dL — ABNORMAL HIGH (ref 0.44–1.00)
GFR, Estimated: 50 mL/min — ABNORMAL LOW (ref >60)
Glucose, Bld: 246 mg/dL — ABNORMAL HIGH (ref 70–99)
Potassium: 4 mmol/L (ref 3.5–5.1)
Sodium: 137 mmol/L (ref 135–145)
Total Bilirubin: 1.2 mg/dL (ref 0.3–1.2)
Total Protein: 5.3 g/dL — ABNORMAL LOW (ref 6.5–8.1)

## 2022-07-28 LAB — T4, FREE: Free T4: 1.08 ng/dL (ref 0.61–1.12)

## 2022-07-28 LAB — TSH: TSH: 6.064 u[IU]/mL — ABNORMAL HIGH (ref 0.350–4.500)

## 2022-07-28 MED ORDER — DEXAMETHASONE 4 MG PO TABS: 8.0000 mg | ORAL_TABLET | Freq: Every day | ORAL | 1 refills | Status: DC

## 2022-07-28 MED ORDER — ACYCLOVIR 400 MG PO TABS: 400.0000 mg | ORAL_TABLET | Freq: Every day | ORAL | 3 refills | Status: DC

## 2022-07-28 MED ORDER — PROCHLORPERAZINE MALEATE 10 MG PO TABS: 10.0000 mg | ORAL_TABLET | Freq: Four times a day (QID) | ORAL | 1 refills | Status: DC | PRN

## 2022-07-28 MED ORDER — ONDANSETRON HCL 8 MG PO TABS: 8.0000 mg | ORAL_TABLET | Freq: Three times a day (TID) | ORAL | 1 refills | Status: DC | PRN

## 2022-07-28 MED ORDER — LIDOCAINE-PRILOCAINE 2.5-2.5 % EX CREA: TOPICAL_CREAM | CUTANEOUS | 3 refills | Status: DC

## 2022-07-28 NOTE — Progress Notes (Signed)
Caregiver will like to know if we can draw a CBC,CMP, TSH/F4 for PCP office; NP Robin.

## 2022-07-28 NOTE — Progress Notes (Signed)
START ON PATHWAY REGIMEN - Lymphoma and CLL     A cycle is every 28 days:     Rituximab-xxxx      Bendamustine   **Always confirm dose/schedule in your pharmacy ordering system**  Patient Characteristics: Marginal Zone Lymphoma, Systemic, First Line, Symptomatic Disease Type: Marginal Zone Lymphoma Disease Type: Not Applicable Disease Type: Not Applicable Localized or Systemic Disease<= Systemic Line of Therapy: First Line Asymptomatic or Symptomatic<= Symptomatic Intent of Therapy: Non-Curative / Palliative Intent, Discussed with Patient

## 2022-07-29 ENCOUNTER — Ambulatory Visit: Payer: Medicare Other

## 2022-07-29 ENCOUNTER — Telehealth: Payer: Self-pay | Admitting: Oncology

## 2022-07-29 ENCOUNTER — Other Ambulatory Visit: Payer: Self-pay

## 2022-07-29 ENCOUNTER — Encounter: Payer: Self-pay | Admitting: *Deleted

## 2022-07-29 DIAGNOSIS — E1165 Type 2 diabetes mellitus with hyperglycemia: Secondary | ICD-10-CM | POA: Diagnosis not present

## 2022-07-29 DIAGNOSIS — C8590 Non-Hodgkin lymphoma, unspecified, unspecified site: Secondary | ICD-10-CM | POA: Diagnosis not present

## 2022-07-29 DIAGNOSIS — E1122 Type 2 diabetes mellitus with diabetic chronic kidney disease: Secondary | ICD-10-CM | POA: Diagnosis not present

## 2022-07-29 DIAGNOSIS — R197 Diarrhea, unspecified: Secondary | ICD-10-CM | POA: Diagnosis not present

## 2022-07-29 NOTE — Telephone Encounter (Signed)
pt MAnager from [facility] called to req that pt be contacted with more information in regards to her chemo education. Would also like for pt to be partnerd with Education officer, museum or some type of support group . Also looks like pt has not had chemo education., scheduled for 10/13 pt on the fence about treatment ,.KJ

## 2022-07-30 ENCOUNTER — Other Ambulatory Visit: Payer: Medicare Other

## 2022-07-30 ENCOUNTER — Ambulatory Visit: Payer: Medicare Other

## 2022-07-30 DIAGNOSIS — Z79891 Long term (current) use of opiate analgesic: Secondary | ICD-10-CM | POA: Diagnosis not present

## 2022-07-30 DIAGNOSIS — E1165 Type 2 diabetes mellitus with hyperglycemia: Secondary | ICD-10-CM | POA: Diagnosis not present

## 2022-07-30 DIAGNOSIS — Z794 Long term (current) use of insulin: Secondary | ICD-10-CM | POA: Diagnosis not present

## 2022-07-30 DIAGNOSIS — E039 Hypothyroidism, unspecified: Secondary | ICD-10-CM | POA: Diagnosis not present

## 2022-07-30 DIAGNOSIS — I5033 Acute on chronic diastolic (congestive) heart failure: Secondary | ICD-10-CM | POA: Diagnosis not present

## 2022-07-30 DIAGNOSIS — E1122 Type 2 diabetes mellitus with diabetic chronic kidney disease: Secondary | ICD-10-CM | POA: Diagnosis not present

## 2022-07-30 DIAGNOSIS — Z9981 Dependence on supplemental oxygen: Secondary | ICD-10-CM | POA: Diagnosis not present

## 2022-07-30 DIAGNOSIS — J9621 Acute and chronic respiratory failure with hypoxia: Secondary | ICD-10-CM | POA: Diagnosis not present

## 2022-07-30 DIAGNOSIS — N1831 Chronic kidney disease, stage 3a: Secondary | ICD-10-CM | POA: Diagnosis not present

## 2022-07-30 DIAGNOSIS — D630 Anemia in neoplastic disease: Secondary | ICD-10-CM | POA: Diagnosis not present

## 2022-07-30 DIAGNOSIS — D696 Thrombocytopenia, unspecified: Secondary | ICD-10-CM | POA: Diagnosis not present

## 2022-07-30 DIAGNOSIS — B0222 Postherpetic trigeminal neuralgia: Secondary | ICD-10-CM | POA: Diagnosis not present

## 2022-07-30 DIAGNOSIS — Z7952 Long term (current) use of systemic steroids: Secondary | ICD-10-CM | POA: Diagnosis not present

## 2022-07-30 DIAGNOSIS — Z9181 History of falling: Secondary | ICD-10-CM | POA: Diagnosis not present

## 2022-07-30 DIAGNOSIS — J90 Pleural effusion, not elsewhere classified: Secondary | ICD-10-CM | POA: Diagnosis not present

## 2022-07-30 DIAGNOSIS — J449 Chronic obstructive pulmonary disease, unspecified: Secondary | ICD-10-CM | POA: Diagnosis not present

## 2022-07-30 DIAGNOSIS — R609 Edema, unspecified: Secondary | ICD-10-CM | POA: Diagnosis not present

## 2022-07-30 DIAGNOSIS — C8599 Non-Hodgkin lymphoma, unspecified, extranodal and solid organ sites: Secondary | ICD-10-CM | POA: Diagnosis not present

## 2022-07-30 DIAGNOSIS — Z8616 Personal history of COVID-19: Secondary | ICD-10-CM | POA: Diagnosis not present

## 2022-07-30 DIAGNOSIS — D631 Anemia in chronic kidney disease: Secondary | ICD-10-CM | POA: Diagnosis not present

## 2022-07-30 DIAGNOSIS — Z87891 Personal history of nicotine dependence: Secondary | ICD-10-CM | POA: Diagnosis not present

## 2022-07-30 DIAGNOSIS — M170 Bilateral primary osteoarthritis of knee: Secondary | ICD-10-CM | POA: Diagnosis not present

## 2022-08-01 ENCOUNTER — Encounter: Payer: Self-pay | Admitting: Oncology

## 2022-08-01 NOTE — Progress Notes (Signed)
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  Her most recent CBC from 06/22/2022 showed white count of 4.3, H&H of 8/26.6 with an MCV of 93 and a platelet count of 77.  Looking back at her prior CBCs her hemoglobin was normal at 13.3 until November 2020 and since June 2023 her hemoglobin has been fluctuating between 8.5-9.5.  Platelets were normal up until November 2020 but fluctuating between 70-80 since June 2023.  Her hemoglobin was normal at 13 in May 2021 as well and we do not have any labs between 2021 and 2023.  Patient states that she was diagnosed with a possible lymphoma a few years ago in [place] but was told that she does not require any treatment   Patient had a CT chest abdomen and pelvis with contrast which showed bulky bilateral axillary mediastinal and hilar adenopathy with the largest axillary node measuring 3.3 x 2.5 cm as compared to 1.7 cm back in 2020.  Severe splenomegaly of 21.9 cm.  Bulky intra-abdominal adenopathy.  Moderate right pleural effusion  Flow cytometry from peripheral blood that was done at [facility] showed clonal B cells which was CD5 negative CD10 negative CD103 negative and CD123 negative.  Differential diagnosis includes marginal zone lymphoma, lymphoplasmacytic lymphoma, DLBCL and atypical CLL.  Blood smear shows a small to medium lymphoid cells with irregular nuclear contours containing  moderately condensed chromatin.  Large abnormal lymphocytes are rare.  Taken together these morphological and immunophenotypic findings favor the diagnosis of marginal zone lymphoma.  Patient had pleural fluid tapped as well and cytology was again consistent with involvement with patient's known B-cell lymphoma consistent with peripheral blood flow cytometry pathology.    Interval history-patient was recently discharged from the hospital following an episode of pneumonia and COVID infection.  At baseline she is on 2 L of oxygen.  Reports ongoing fatigue.  ECOG PS- 2 Pain scale- 0   Review of systems- Review of Systems  Constitutional:  Positive for malaise/fatigue and weight loss. Negative for chills and fever.  HENT:  Negative for congestion, ear discharge and nosebleeds.   Eyes:  Negative for blurred vision.  Respiratory:  Negative for cough, hemoptysis, sputum production, shortness of breath and wheezing.   Cardiovascular:  Positive for leg swelling. Negative for chest pain, palpitations, orthopnea and claudication.  Gastrointestinal:  Negative for abdominal pain, blood in stool, constipation, diarrhea, heartburn, melena, nausea and vomiting.  Genitourinary:  Negative for dysuria, flank pain, frequency, hematuria and urgency.  Musculoskeletal:  Negative for back pain, joint pain and myalgias.  Skin:  Negative for rash.  Neurological:  Negative for dizziness, tingling, focal weakness, seizures, weakness and headaches.  Endo/Heme/Allergies:  Does not bruise/bleed easily.  Psychiatric/Behavioral:  Negative for depression and suicidal ideas. The patient does not have insomnia.       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   Diabetes mellitus without complication (HCC)    Pleural effusion    [facility] 850m,  2 weeks ago   Vaginal delivery    x 5     Past Surgical History:  Procedure  Laterality Date   TONSILLECTOMY Bilateral    as a child    Social History   Socioeconomic History   Marital status: Widowed    Spouse name: Not on file   Number of children: 2   Years of education: Not on file   Highest education level: 9th grade  Occupational History   Occupation: unemployed  Tobacco Use   Smoking status: Every Day    Packs/day: 2.00    Years: 55.00    Total pack years: 110.00    Types: Cigarettes    Start date: 07/07/1961   Smokeless tobacco: Never   Tobacco comments:    1/2 pack she states but family says 2 PPD  Vaping Use   Vaping Use: Never used  Substance and Sexual Activity   Alcohol use: No   Drug use: Never   Sexual activity: Not Currently    Partners: Male    Birth control/protection: Post-menopausal  Other Topics Concern   Not on file  Social History Narrative   08/14/20   [place]  Living: to be near daughter   Work: retired      FPhysiological scientistchildren - JIT consultant(nearby) and son JEvelena Peat[place]  8 grandchildren, & 2 great-grandchildren.      Enjoys: stays at home      Exercise: walking to her daughter's store   Diet: eats fruit, air fried chicken      Safety   Seat belts: Yes    Guns: No   Safe in relationships: Yes    Social Determinants of Health   Financial Resource Strain: High Risk (07/07/2018)   Overall Financial Resource Strain (CARDIA)    Difficulty of Paying Living Expenses: Very hard  Food Insecurity: No Food Insecurity (07/15/2022)   Hunger Vital Sign    Worried About Running Out of Food in the Last Year: Never true    Ran Out of Food in the Last Year: Never true  Transportation Needs: No Transportation Needs (07/15/2022)   PRAPARE - THydrologist(Medical): No    Lack of Transportation (Non-Medical): No  Physical Activity: Insufficiently Active (07/07/2018)   Exercise Vital Sign    Days of Exercise per Week: 7 days    Minutes of Exercise per Session: 10 min  Stress: No Stress Concern  Present (07/07/2018)   [place]   Feeling of Stress : Not at all  Social Connections: Somewhat Isolated (07/07/2018)   Social Connection and Isolation Panel [NHANES]    Frequency of Communication with Friends and Family: Never    Frequency of Social Gatherings with Friends and Family: More than three times a week    Attends Religious Services: More than 4 times per year    Active Member of [employer] Organizations: No    Attends CArchivistMeetings: Never    Marital Status: Widowed  Intimate Partner Violence: Not At Risk (07/15/2022)   Humiliation, Afraid, Rape, and Kick questionnaire    Fear of Current or Ex-Partner: No    Emotionally Abused: No    Physically Abused: No    Sexually Abused: No    Family History  Problem Relation Age of Onset   Breast  cancer Mother    Alzheimer's disease Father    Lung cancer Father        lung   Varicose Veins Brother    Heart attack Brother      Current Outpatient Medications:    insulin NPH-regular Human (70-30) 100 UNIT/ML injection, Inject 5-10 Units into the skin 2 (two) times daily with a meal. Use only for hyperglycemia, CBG+ 300. Start with 5 units, titrate up to max 10 unit per dose if need., Disp: 3 mL, Rfl: 0   Insulin Syringe-Needle U-100 31G X 15/64" 0.3 ML MISC, Use to inject insulin twice a day with meals., Disp: 90 each, Rfl: 0   Ipratropium-Albuterol (COMBIVENT RESPIMAT) 20-100 MCG/ACT AERS respimat, Inhale 1 puff into the lungs every 6 (six) hours., Disp: , Rfl:    levothyroxine (SYNTHROID) 50 MCG tablet, Take 1 tablet (50 mcg total) by mouth daily at 6 (six) AM., Disp: 30 tablet, Rfl: 0   loperamide (IMODIUM) 2 MG capsule, Take 2 mg by mouth as needed for diarrhea or loose stools., Disp: , Rfl:    metFORMIN (GLUCOPHAGE) 500 MG tablet, Take 1 tablet (500 mg total) by mouth 2 (two) times daily with a meal., Disp: 180 tablet, Rfl: 3   Naproxen  Sod-diphenhydrAMINE 220-25 MG TABS, Take by mouth., Disp: , Rfl:    nicotine (NICODERM CQ - DOSED IN MG/24 HOURS) 21 mg/24hr patch, Place 1 patch (21 mg total) onto the skin daily., Disp: 28 patch, Rfl: 0   potassium chloride (KLOR-CON M) 10 MEQ tablet, Take 1 tablet (10 mEq total) by mouth 2 (two) times daily., Disp: 28 tablet, Rfl: 0   Simethicone 180 MG CAPS, Take by mouth., Disp: , Rfl:    torsemide (DEMADEX) 20 MG tablet, Take 1 tablet (20 mg total) by mouth daily., Disp: 14 tablet, Rfl: 0   acyclovir (ZOVIRAX) 400 MG tablet, Take 1 tablet (400 mg total) by mouth daily., Disp: 30 tablet, Rfl: 3   albuterol (VENTOLIN HFA) 108 (90 Base) MCG/ACT inhaler, TAKE 2 PUFFS BY MOUTH EVERY 6 HOURS AS NEEDED FOR WHEEZE OR SHORTNESS OF BREATH, Disp: 18 g, Rfl: 1   betamethasone valerate ointment (VALISONE) 0.1 %, Apply topically 2 (two) times daily., Disp: , Rfl:    dexamethasone (DECADRON) 4 MG tablet, Take 2 tablets (8 mg total) by mouth daily. Start the day after bendamustine chemotherapy for 2 days. Take with food., Disp: 30 tablet, Rfl: 1   lidocaine-prilocaine (EMLA) cream, Apply to affected area once, Disp: 30 g, Rfl: 3   ondansetron (ZOFRAN) 8 MG tablet, Take 1 tablet (8 mg total) by mouth every 8 (eight) hours as needed for nausea or vomiting. Start on the third day after chemotherapy., Disp: 30 tablet, Rfl: 1   prochlorperazine (COMPAZINE) 10 MG tablet, Take 1 tablet (10 mg total) by mouth every 6 (six) hours as needed for nausea or vomiting., Disp: 30 tablet, Rfl: 1  Physical exam:  Vitals:   07/28/22 1125  BP: (!) 91/49  Pulse: 87  Resp: 18  Temp: 99 F (37.2 C)  SpO2: 100%  Weight: 150 lb 1.6 oz (68.1 kg)   Physical Exam Constitutional:      General: She is not in acute distress.    Comments: Home oxygen  Cardiovascular:     Rate and Rhythm: Normal rate and regular rhythm.     Heart sounds: Normal heart sounds.  Pulmonary:     Effort: Pulmonary effort is normal.     Breath  sounds:  Normal breath sounds.  Musculoskeletal:     Right lower leg: Edema present.     Left lower leg: Edema present.  Skin:    General: Skin is warm and dry.  Neurological:     Mental Status: She is alert and oriented to person, place, and time.         Latest Ref Rng & Units 07/28/2022   12:36 PM  CMP  Glucose 70 - 99 mg/dL 246   BUN 8 - 23 mg/dL 44   Creatinine 0.44 - 1.00 mg/dL 1.15   Sodium 135 - 145 mmol/L 137   Potassium 3.5 - 5.1 mmol/L 4.0   Chloride 98 - 111 mmol/L 100   CO2 22 - 32 mmol/L 31   Calcium 8.9 - 10.3 mg/dL 9.5   Total Protein 6.5 - 8.1 g/dL 5.3   Total Bilirubin 0.3 - 1.2 mg/dL 1.2   Alkaline Phos 38 - 126 U/L 126   AST 15 - 41 U/L 19   ALT 0 - 44 U/L 12       Latest Ref Rng & Units 07/28/2022   12:36 PM  CBC  WBC 4.0 - 10.5 K/uL 5.1   Hemoglobin 12.0 - 15.0 g/dL 6.7   Hematocrit 36.0 - 46.0 % 21.9   Platelets 150 - 400 K/uL 88       ECHOCARDIOGRAM COMPLETE  Result Date: 07/13/2022    ECHOCARDIOGRAM REPORT   Patient Name:   Natasha [name] Date of Exam: 07/13/2022 Medical Rec #:  [id number]      Height:       62.0 in Accession #:    [id number]     Weight:       170.2 lb Date of Birth:  1946-05-12       BSA:          1.785 m Patient Age:    76 years       BP:           90/59 mmHg Patient Gender: F              HR:           80 bpm. Exam Location:  [facility] Procedure: 2D Echo, Cardiac Doppler and Color Doppler Indications:     CHF-acute dastolic L87.56  History:         Patient has no prior history of Echocardiogram examinations.                  Risk Factors:Diabetes. Pleural effusion.  Sonographer:     Sherrie Sport Referring Phys:  [id number] JAN A MANSY Diagnosing Phys: Nelva Bush MD  Sonographer Comments: Suboptimal parasternal window. IMPRESSIONS  1. Left ventricular ejection fraction, by estimation, is 60 to 65%. The left ventricle has normal function. The left ventricle has no regional wall motion abnormalities. Left ventricular diastolic parameters were  normal.  2. Right ventricular systolic function is normal. The right ventricular size is normal. There is mildly elevated pulmonary artery systolic pressure.  3. The mitral valve is normal in structure. Mild mitral valve regurgitation. No evidence of mitral stenosis.  4. Tricuspid valve regurgitation is mild to moderate.  5. The aortic valve has an indeterminant number of cusps. There is mild thickening of the aortic valve. Aortic valve regurgitation is not visualized. Aortic valve sclerosis is present, with no evidence of aortic valve stenosis.  6. The inferior vena cava is normal in size with greater than 50% respiratory variability, suggesting right atrial pressure of  3 mmHg. FINDINGS  Left Ventricle: Left ventricular ejection fraction, by estimation, is 60 to 65%. The left ventricle has normal function. The left ventricle has no regional wall motion abnormalities. The left ventricular internal cavity size was normal in size. There is  no left ventricular hypertrophy. Left ventricular diastolic parameters were normal. Right Ventricle: The right ventricular size is normal. No increase in right ventricular wall thickness. Right ventricular systolic function is normal. There is mildly elevated pulmonary artery systolic pressure. The tricuspid regurgitant velocity is 3.02  m/s, and with an assumed right atrial pressure of 3 mmHg, the estimated right ventricular systolic pressure is 01.5 mmHg. Left Atrium: Left atrial size was normal in size. Right Atrium: Right atrial size was normal in size. Pericardium: There is no evidence of pericardial effusion. Mitral Valve: The mitral valve is normal in structure. Mild mitral valve regurgitation. No evidence of mitral valve stenosis. Tricuspid Valve: The tricuspid valve is normal in structure. Tricuspid valve regurgitation is mild to moderate. Aortic Valve: The aortic valve has an indeterminant number of cusps. There is mild thickening of the aortic valve. Aortic valve  regurgitation is not visualized. Aortic valve sclerosis is present, with no evidence of aortic valve stenosis. Aortic valve mean gradient measures 8.0 mmHg. Aortic valve peak gradient measures 14.3 mmHg. Aortic valve area, by VTI measures 2.57 cm. Pulmonic Valve: The pulmonic valve was not well visualized. Pulmonic valve regurgitation is not visualized. No evidence of pulmonic stenosis. Aorta: The aortic root is normal in size and structure. Pulmonary Artery: The pulmonary artery is not well seen. Venous: The inferior vena cava is normal in size with greater than 50% respiratory variability, suggesting right atrial pressure of 3 mmHg. IAS/Shunts: The interatrial septum was not well visualized.  LEFT VENTRICLE PLAX 2D LVIDd:         4.00 cm   Diastology LVIDs:         2.40 cm   LV e' medial:    10.90 cm/s LV PW:         1.00 cm   LV E/e' medial:  9.0 LV IVS:        0.70 cm   LV e' lateral:   12.90 cm/s LVOT diam:     2.00 cm   LV E/e' lateral: 7.6 LV SV:         77 LV SV Index:   43 LVOT Area:     3.14 cm  RIGHT VENTRICLE RV Basal diam:  3.05 cm RV Mid diam:    3.10 cm LEFT ATRIUM             Index        RIGHT ATRIUM           Index LA diam:        4.20 cm 2.35 cm/m   RA Area:     14.60 cm LA Vol (A2C):   42.8 ml 23.98 ml/m  RA Volume:   34.40 ml  19.27 ml/m LA Vol (A4C):   39.7 ml 22.24 ml/m LA Biplane Vol: 44.6 ml 24.99 ml/m  AORTIC VALVE AV Area (Vmax):    2.06 cm AV Area (Vmean):   1.98 cm AV Area (VTI):     2.57 cm AV Vmax:           189.00 cm/s AV Vmean:          133.000 cm/s AV VTI:            0.298 m AV Peak Grad:  14.3 mmHg AV Mean Grad:      8.0 mmHg LVOT Vmax:         124.00 cm/s LVOT Vmean:        83.800 cm/s LVOT VTI:          0.244 m LVOT/AV VTI ratio: 0.82  AORTA Ao Root diam: 3.10 cm MITRAL VALVE               TRICUSPID VALVE MV Area (PHT): 5.75 cm    TR Peak grad:   36.5 mmHg MV Decel Time: 132 msec    TR Vmax:        302.00 cm/s MV E velocity: 98.50 cm/s MV A velocity: 98.00 cm/s   SHUNTS MV E/A ratio:  1.01        Systemic VTI:  0.24 m                            Systemic Diam: 2.00 cm Nelva Bush MD Electronically signed by Nelva Bush MD Signature Date/Time: 07/13/2022/4:23:33 PM    Final    DG Chest Port 1 View  Result Date: 07/13/2022 CLINICAL DATA:  Thoracentesis EXAM: PORTABLE CHEST 1 VIEW COMPARISON:  07/12/2022 FINDINGS: Stable cardiomediastinal contours. Moderate-sized bilateral pleural effusions. Right-sided pleural effusion has decreased in volume following thoracentesis. Hazy bibasilar airspace opacities. Stable partially calcified nodule in the right upper lobe. Pulmonary vascular congestion. No pneumothorax. IMPRESSION: 1. Decreased volume of right pleural effusion following thoracentesis. No pneumothorax. 2. Persistent moderate bilateral pleural effusions with edema. Electronically Signed   By: Davina Poke D.O.   On: 07/13/2022 11:50   US THORACENTESIS ASP PLEURAL SPACE W/IMG GUIDE  Result Date: 07/13/2022 INDICATION: Patient with history of gastric lymphoma with recurrent pleural effusions. Request is for therapeutic and diagnostic thoracentesis EXAM: ULTRASOUND GUIDED THERAPEUTIC AND DIAGNOSTIC RIGHT-SIDED THORACENTESIS MEDICATIONS: Lidocaine 1% 10 mL COMPLICATIONS: None immediate. PROCEDURE: An ultrasound guided thoracentesis was thoroughly discussed with the patient and questions answered. The benefits, risks, alternatives and complications were also discussed. The patient understands and wishes to proceed with the procedure. Written consent was obtained. Ultrasound was performed to localize and mark an adequate pocket of fluid in the right chest. The area was then prepped and draped in the normal sterile fashion. 1% Lidocaine was used for local anesthesia. Under ultrasound guidance a 6 Fr Safe-T-Centesis catheter was introduced. Thoracentesis was performed. The catheter was removed and a dressing applied. FINDINGS: A total of approximately 850 mL of  amber colored fluid was removed. Samples were sent to the laboratory as requested by the clinical team. IMPRESSION: Successful ultrasound guided therapeutic and diagnostic right-sided thoracentesis yielding 850 mL of pleural fluid. Read by: Rushie Nyhan, NP Electronically Signed   By: Albin Felling M.D.   On: 07/13/2022 11:26   CT Angio Chest PE W and/or Wo Contrast  Result Date: 07/12/2022 CLINICAL DATA:  Severe shortness of breath, weakness, fell yesterday, history of lymphoma EXAM: CT ANGIOGRAPHY CHEST WITH CONTRAST TECHNIQUE: Multidetector CT imaging of the chest was performed using the standard protocol during bolus administration of intravenous contrast. Multiplanar CT image reconstructions and MIPs were obtained to evaluate the vascular anatomy. RADIATION DOSE REDUCTION: This exam was performed according to the departmental dose-optimization program which includes automated exposure control, adjustment of the mA and/or kV according to patient size and/or use of iterative reconstruction technique. CONTRAST:  56m OMNIPAQUE IOHEXOL 350 MG/ML SOLN COMPARISON:  06/16/2022, 04/16/2022 FINDINGS: Cardiovascular: This is a technically adequate  evaluation of the pulmonary vasculature. No filling defects or pulmonary emboli. The heart is unremarkable without pericardial effusion. No evidence of thoracic aortic aneurysm or dissection. Mediastinum/Nodes: The extensive mediastinal, hilar, and axillary adenopathy seen on prior exam again identified, not appreciably changed. Index lymph nodes are as follows: Right axilla, image 24/4, 3.1 x 2.0 cm. Precarinal, image 42/4, 1.7 x 2.1 cm. Stable appearance of the thyroid, trachea, and esophagus. Lungs/Pleura: There are persistent bilateral pleural effusions, right greater than left, increased since prior study. Dense consolidation within the lung bases, greatest in the lower lobes, most compatible with compressive atelectasis. Partially calcified right upper lobe  nodule again identified reference image 51/4 measuring up to 2 cm. No pneumothorax. Central airways are patent. Upper Abdomen: Partial visualization of marked splenomegaly and extensive upper abdominal lymphadenopathy. Musculoskeletal: There is diffuse body wall edema. No acute or destructive bony lesions. Reconstructed images demonstrate no additional findings. Review of the MIP images confirms the above findings. IMPRESSION: 1. No evidence of pulmonary embolus. 2. Worsening large bilateral pleural effusions with underlying lower lobe atelectasis. 3. Diffuse anasarca. 4. Extensive thoracic and abdominal lymphadenopathy, as well as marked splenomegaly, compatible with patient's known history of lymphoma. Electronically Signed   By: Randa Ngo M.D.   On: 07/12/2022 17:05   CT Head Wo Contrast  Result Date: 07/12/2022 CLINICAL DATA:  Fall.  History of lymphoma. EXAM: CT HEAD WITHOUT CONTRAST TECHNIQUE: Contiguous axial images were obtained from the base of the skull through the vertex without intravenous contrast. RADIATION DOSE REDUCTION: This exam was performed according to the departmental dose-optimization program which includes automated exposure control, adjustment of the mA and/or kV according to patient size and/or use of iterative reconstruction technique. COMPARISON:  03/19/2020 head CT, 09/29/2018 PET-CT FINDINGS: Brain: No evidence of acute infarction, hemorrhage, hydrocephalus, extra-axial collection or mass lesion/mass effect. Patchy low-density changes within the periventricular and subcortical white matter compatible with chronic microvascular ischemic change. Mild diffuse cerebral volume loss. Vascular: No hyperdense vessel or unexpected calcification. Skull: Normal. Negative for fracture or focal lesion. Sinuses/Orbits: Mucosal thickening of the right maxillary and right frontal sinuses. Partial bilateral mastoid effusions. Other: Marked enlargement of the bilateral parotid glands is new from  2019. IMPRESSION: 1. No acute intracranial findings. 2. Marked enlargement of the bilateral parotid glands, new from 2019. 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 3. Chronic microvascular ischemic change and cerebral volume loss. 4. Right maxillary and right frontal sinus disease. Partial bilateral mastoid effusions. Electronically Signed   By: Davina Poke D.O.   On: 07/12/2022 15:36   DG Chest Portable 1 View  Result Date: 07/12/2022 CLINICAL DATA:  Shortness of breath, hypoxia EXAM: PORTABLE CHEST 1 VIEW COMPARISON:  Previous studies including the examination of 06/17/2022 FINDINGS: Transverse diameter of heart is increased. Central pulmonary vessels are more prominent. Moderate to large bilateral pleural effusions are seen, more so on the right side with significant interval increase. There is no pneumothorax. Evaluation of mid and lower lung fields for focal infiltrates is limited by the effusions. IMPRESSION: Central pulmonary vessels are more prominent suggesting CHF. Moderate to large bilateral pleural effusions, more so on the right side with significant interval increase. Electronically Signed   By: Elmer Picker M.D.   On: 07/12/2022 15:05     Assessment and plan- Patient is a 76 y.o. female with Stage IV nodal marginal zone lymphoma  When I first met the patient on  06/29/2022 I was unaware of the peripheral blood flow cytometry that was done at [facility].  In reviewing those results as well as pleural fluid results it is now evident that both the samples show CD5 negative, CD10 negative, CD123 negative small appearing lymphoid cells and morphologic and immunophenotypic pattern was consistent with a marginal zone lymphoma.  although lymphoplasmacytic lymphoma is in the differential IgM levels are actually low and immuno fixation did not reveal any monoclonal protein.  All immunoglobulins were decreased.  CT chest  abdomen pelvis which was done in June as well as CT chest in September 2023 reveals extensive adenopathy both above and below the diaphragm along with significant splenomegaly and pleural effusion all of which is consistent with a widespread nodal marginal zone lymphoma.  Patient also has significant pancytopenia likely secondary to bone marrow involvement due to malignancy.  Given that her pleural fluid is positive this would constitute stage IV disease.  Patient in general has been hesitant to initiate and pursue any treatment I am therefore holding off on bone marrow biopsy at this time as it would not change management for stage IV disease.  A randomized, phase 3 noninferiority trial comparing BR versus R-CHOP included 67 patients with marginal zone lymphoma. In the patients with other histologic subtypes, BR improved PFS and was less toxic than R-CHOP. Among the patients with marginal zone lymphoma, median PFS with BR was not significantly different from that with R-CHOP (57 versus 47 months; HR 0.70, 95% CI 0.34-1.43).   Patient is most concerned about hair loss with treatment and typically Bendamustine Rituxan regimen is rarely associated with hair loss.  Discussed risks and benefits of treatment including all but not limited to nausea, vomiting, low blood counts, risk of infections and hospitalizations.  Risk of infusion reaction associated with Rituxan.  Treatment will be given with a palliative intent.  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  I am repeating her CBC with differential CMP and TSH today.  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  They are presently normal.  We will plan for port placement chemo teach and a PET scan at this  time.  Her labs today did show significant anemia likely secondary to bone marrow involvement and we will plan for 1 unit of PRBC transfusion as well.   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 Goals of care, counseling/discussion      Dr. Randa Evens, MD, MPH [facility] at [facility] [phone] 08/01/2022 8:29 AM

## 2022-08-02 ENCOUNTER — Inpatient Hospital Stay
Admission: EM | Admit: 2022-08-02 | Discharge: 2022-08-04 | DRG: 291 | Disposition: A | Discharge status: Home Health | Payer: Medicare Other | Attending: Obstetrics and Gynecology | Admitting: Obstetrics and Gynecology

## 2022-08-02 ENCOUNTER — Emergency Department: Payer: Medicare Other

## 2022-08-02 ENCOUNTER — Other Ambulatory Visit: Payer: Self-pay

## 2022-08-02 ENCOUNTER — Other Ambulatory Visit: Payer: Medicare Other

## 2022-08-02 DIAGNOSIS — C8300 Small cell B-cell lymphoma, unspecified site: Secondary | ICD-10-CM | POA: Diagnosis present

## 2022-08-02 DIAGNOSIS — R809 Proteinuria, unspecified: Secondary | ICD-10-CM | POA: Diagnosis not present

## 2022-08-02 DIAGNOSIS — I5033 Acute on chronic diastolic (congestive) heart failure: Secondary | ICD-10-CM | POA: Diagnosis not present

## 2022-08-02 DIAGNOSIS — F17210 Nicotine dependence, cigarettes, uncomplicated: Secondary | ICD-10-CM | POA: Diagnosis not present

## 2022-08-02 DIAGNOSIS — J910 Malignant pleural effusion: Secondary | ICD-10-CM | POA: Diagnosis not present

## 2022-08-02 DIAGNOSIS — E119 Type 2 diabetes mellitus without complications: Secondary | ICD-10-CM | POA: Diagnosis not present

## 2022-08-02 DIAGNOSIS — J9811 Atelectasis: Secondary | ICD-10-CM | POA: Diagnosis not present

## 2022-08-02 DIAGNOSIS — E039 Hypothyroidism, unspecified: Secondary | ICD-10-CM | POA: Diagnosis present

## 2022-08-02 DIAGNOSIS — I509 Heart failure, unspecified: Secondary | ICD-10-CM

## 2022-08-02 DIAGNOSIS — U071 COVID-19: Secondary | ICD-10-CM | POA: Diagnosis not present

## 2022-08-02 DIAGNOSIS — R062 Wheezing: Secondary | ICD-10-CM | POA: Diagnosis not present

## 2022-08-02 DIAGNOSIS — Z743 Need for continuous supervision: Secondary | ICD-10-CM | POA: Diagnosis not present

## 2022-08-02 DIAGNOSIS — Z8616 Personal history of COVID-19: Secondary | ICD-10-CM | POA: Diagnosis not present

## 2022-08-02 DIAGNOSIS — R609 Edema, unspecified: Secondary | ICD-10-CM | POA: Diagnosis not present

## 2022-08-02 DIAGNOSIS — D6959 Other secondary thrombocytopenia: Secondary | ICD-10-CM | POA: Diagnosis present

## 2022-08-02 DIAGNOSIS — R0602 Shortness of breath: Secondary | ICD-10-CM | POA: Diagnosis not present

## 2022-08-02 DIAGNOSIS — Z9981 Dependence on supplemental oxygen: Secondary | ICD-10-CM

## 2022-08-02 DIAGNOSIS — E1129 Type 2 diabetes mellitus with other diabetic kidney complication: Secondary | ICD-10-CM | POA: Diagnosis not present

## 2022-08-02 DIAGNOSIS — D630 Anemia in neoplastic disease: Secondary | ICD-10-CM | POA: Diagnosis not present

## 2022-08-02 DIAGNOSIS — Z79899 Other long term (current) drug therapy: Secondary | ICD-10-CM | POA: Diagnosis not present

## 2022-08-02 DIAGNOSIS — I959 Hypotension, unspecified: Secondary | ICD-10-CM | POA: Diagnosis present

## 2022-08-02 DIAGNOSIS — Z820 Family history of epilepsy and other diseases of the nervous system: Secondary | ICD-10-CM

## 2022-08-02 DIAGNOSIS — D696 Thrombocytopenia, unspecified: Secondary | ICD-10-CM | POA: Diagnosis present

## 2022-08-02 DIAGNOSIS — D649 Anemia, unspecified: Secondary | ICD-10-CM

## 2022-08-02 DIAGNOSIS — R6889 Other general symptoms and signs: Secondary | ICD-10-CM | POA: Diagnosis not present

## 2022-08-02 DIAGNOSIS — C8590 Non-Hodgkin lymphoma, unspecified, unspecified site: Secondary | ICD-10-CM | POA: Diagnosis not present

## 2022-08-02 DIAGNOSIS — Z794 Long term (current) use of insulin: Secondary | ICD-10-CM

## 2022-08-02 DIAGNOSIS — J9621 Acute and chronic respiratory failure with hypoxia: Secondary | ICD-10-CM | POA: Diagnosis not present

## 2022-08-02 DIAGNOSIS — I5030 Unspecified diastolic (congestive) heart failure: Secondary | ICD-10-CM | POA: Diagnosis not present

## 2022-08-02 DIAGNOSIS — J9601 Acute respiratory failure with hypoxia: Secondary | ICD-10-CM

## 2022-08-02 DIAGNOSIS — J90 Pleural effusion, not elsewhere classified: Secondary | ICD-10-CM | POA: Diagnosis not present

## 2022-08-02 DIAGNOSIS — C8580 Other specified types of non-Hodgkin lymphoma, unspecified site: Secondary | ICD-10-CM | POA: Diagnosis present

## 2022-08-02 DIAGNOSIS — Z8249 Family history of ischemic heart disease and other diseases of the circulatory system: Secondary | ICD-10-CM | POA: Diagnosis not present

## 2022-08-02 DIAGNOSIS — Z803 Family history of malignant neoplasm of breast: Secondary | ICD-10-CM

## 2022-08-02 DIAGNOSIS — E1122 Type 2 diabetes mellitus with diabetic chronic kidney disease: Secondary | ICD-10-CM | POA: Diagnosis not present

## 2022-08-02 DIAGNOSIS — Z801 Family history of malignant neoplasm of trachea, bronchus and lung: Secondary | ICD-10-CM

## 2022-08-02 DIAGNOSIS — R0689 Other abnormalities of breathing: Secondary | ICD-10-CM | POA: Diagnosis not present

## 2022-08-02 DIAGNOSIS — J449 Chronic obstructive pulmonary disease, unspecified: Secondary | ICD-10-CM | POA: Diagnosis present

## 2022-08-02 DIAGNOSIS — Z79890 Hormone replacement therapy (postmenopausal): Secondary | ICD-10-CM | POA: Diagnosis not present

## 2022-08-02 DIAGNOSIS — N179 Acute kidney failure, unspecified: Secondary | ICD-10-CM | POA: Diagnosis not present

## 2022-08-02 DIAGNOSIS — J441 Chronic obstructive pulmonary disease with (acute) exacerbation: Secondary | ICD-10-CM | POA: Diagnosis not present

## 2022-08-02 LAB — CBC
HCT: 21.6 % — ABNORMAL LOW (ref 36.0–46.0)
Hemoglobin: 6.4 g/dL — ABNORMAL LOW (ref 12.0–15.0)
MCH: 28.2 pg (ref 26.0–34.0)
MCHC: 29.6 g/dL — ABNORMAL LOW (ref 30.0–36.0)
MCV: 95.2 fL (ref 80.0–100.0)
Platelets: 98 10*3/uL — ABNORMAL LOW (ref 150–400)
RBC: 2.27 MIL/uL — ABNORMAL LOW (ref 3.87–5.11)
RDW: 18.6 % — ABNORMAL HIGH (ref 11.5–15.5)
WBC: 5 10*3/uL (ref 4.0–10.5)
nRBC: 2 % — ABNORMAL HIGH (ref 0.0–0.2)

## 2022-08-02 LAB — HEPATIC FUNCTION PANEL
ALT: 12 U/L (ref 0–44)
AST: 21 U/L (ref 15–41)
Albumin: 3 g/dL — ABNORMAL LOW (ref 3.5–5.0)
Alkaline Phosphatase: 126 U/L (ref 38–126)
Bilirubin, Direct: 0.5 mg/dL — ABNORMAL HIGH (ref 0.0–0.2)
Indirect Bilirubin: 1.2 mg/dL — ABNORMAL HIGH (ref 0.3–0.9)
Total Bilirubin: 1.7 mg/dL — ABNORMAL HIGH (ref 0.3–1.2)
Total Protein: 5.4 g/dL — ABNORMAL LOW (ref 6.5–8.1)

## 2022-08-02 LAB — BASIC METABOLIC PANEL
Anion gap: 6 (ref 5–15)
BUN: 38 mg/dL — ABNORMAL HIGH (ref 8–23)
CO2: 27 mmol/L (ref 22–32)
Calcium: 9.4 mg/dL (ref 8.9–10.3)
Chloride: 104 mmol/L (ref 98–111)
Creatinine, Ser: 0.96 mg/dL (ref 0.44–1.00)
GFR, Estimated: >60 mL/min (ref >60)
Glucose, Bld: 156 mg/dL — ABNORMAL HIGH (ref 70–99)
Potassium: 4.2 mmol/L (ref 3.5–5.1)
Sodium: 137 mmol/L (ref 135–145)

## 2022-08-02 LAB — PROCALCITONIN: Procalcitonin: 0.16 ng/mL

## 2022-08-02 LAB — TROPONIN I (HIGH SENSITIVITY): Troponin I (High Sensitivity): 8 ng/L (ref <18)

## 2022-08-02 LAB — PREPARE RBC (CROSSMATCH)

## 2022-08-02 LAB — BRAIN NATRIURETIC PEPTIDE: B Natriuretic Peptide: 171.4 pg/mL — ABNORMAL HIGH (ref 0.0–100.0)

## 2022-08-02 MED ORDER — ALBUTEROL SULFATE HFA 108 (90 BASE) MCG/ACT IN AERS
2.0000 | INHALATION_SPRAY | RESPIRATORY_TRACT | Status: DC | PRN
  Administered 2022-08-03: 2 via RESPIRATORY_TRACT
  Filled 2022-08-02: qty 6.7

## 2022-08-02 MED ORDER — SODIUM CHLORIDE 0.9 % IV SOLN
10.0000 mL/h | Freq: Once | INTRAVENOUS | Status: AC
  Administered 2022-08-02: 10 mL/h via INTRAVENOUS

## 2022-08-02 NOTE — ED Notes (Signed)
Box lunch provided as per verbal okay from ER provider

## 2022-08-02 NOTE — ED Triage Notes (Signed)
Pt comes via EMs with c/o sob that started last night. Pt has hx of CHF and COPD. Pt wears 3L [place] at home. Pt placed on 4L by EMS with O2 at 98%.  Pt states swelling to leg and abdomen.   Pt recently dx with lymphoma

## 2022-08-02 NOTE — ED Provider Triage Note (Signed)
  Emergency Medicine Provider Triage Evaluation Note  Natasha [name] , a 76 y.o.female,  was evaluated in triage.  Pt complains of shortness of breath.  She states that she began to feel short of breath last night.  She has a history of COPD and diastolic CHF.  Reports swelling in her lower extremities and abdomen as well.  Recently diagnosed with lymphoma.   Review of Systems  Positive: Shortness of breath. Negative: Denies fever, chest pain, vomiting  Physical Exam   Vitals:   08/02/22 1609  BP: (!) 104/53  Pulse: 81  Resp: (!) 24  Temp: 98.2 F (36.8 C)  SpO2: 100%   Gen:   Awake, no distress   Resp:  Normal effort  MSK:   Moves extremities without difficulty  Other:    Medical Decision Making  Given the patient's initial medical screening exam, the following diagnostic evaluation has been ordered. The patient will be placed in the appropriate treatment space, once one is available, to complete the evaluation and treatment. I have discussed the plan of care with the patient and I have advised the patient that an ED physician or mid-level practitioner will reevaluate their condition after the test results have been received, as the results may give them additional insight into the type of treatment they may need.    Diagnostics: Labs, EKG, CXR  Treatments: none immediately   Teodoro Spray, [place] 08/02/22 1911

## 2022-08-02 NOTE — ED Provider Notes (Signed)
[facility] Provider Note    Event Date/Time   First MD Initiated Contact with Patient 08/02/22 2102     (approximate)   History   Shortness of Breath   HPI  Natasha [name] is a 76 y.o. female  with h/o T2DM, COPD, newly diagnosed gastric lymphoma here with worsening SOB. Pt erports that over the past several days she has had worsening SOB, complicated by severe DOE and orthopnea. She wears 3-4 L at baseline but has had to increase this. She has also had cough, wheezing. She denies any hemoptysis. She has had increasing LE edema as well though this is slightly better now. No abd pain above her baseline. She had difficulty even getting around today which is why she presented to the ED.       Physical Exam   Triage Vital Signs: ED Triage Vitals  Enc Vitals Group     BP 08/02/22 1609 (!) 104/53     Pulse Rate 08/02/22 1609 81     Resp 08/02/22 1609 (!) 24     Temp 08/02/22 1609 98.2 F (36.8 C)     Temp Source 08/02/22 1609 Oral     SpO2 08/02/22 1609 100 %     Weight --      Height --      Head Circumference --      Peak Flow --      Pain Score 08/02/22 1622 4     Pain Loc --      Pain Edu? --      Excl. in [place]? --     Most recent vital signs: Vitals:   08/03/22 0030 08/03/22 0125  BP: 104/64 (!) 97/56  Pulse: 89 90  Resp:  (!) 24  Temp:  98.3 F (36.8 C)  SpO2: 99% 99%     General: Awake, no distress.  CV:  Good peripheral perfusion. RRR. No murmurs. Resp:  Normal effort. Tachypneic, with diminished breath sounds bilateral bases. Abd:  No distention.  Other:  2+ edema b/l LE.   ED Results / Procedures / Treatments   Labs (all labs ordered are listed, but only abnormal results are displayed) Labs Reviewed  SARS CORONAVIRUS 2 BY RT PCR - Abnormal; Notable for the following components:      Result Value   SARS Coronavirus 2 by RT PCR POSITIVE (*)    All other components within normal limits  CBC - Abnormal; Notable for the  following components:   RBC 2.27 (*)    Hemoglobin 6.4 (*)    HCT 21.6 (*)    MCHC 29.6 (*)    RDW 18.6 (*)    Platelets 98 (*)    nRBC 2.0 (*)    All other components within normal limits  BASIC METABOLIC PANEL - Abnormal; Notable for the following components:   Glucose, Bld 156 (*)    BUN 38 (*)    All other components within normal limits  BRAIN NATRIURETIC PEPTIDE - Abnormal; Notable for the following components:   B Natriuretic Peptide 171.4 (*)    All other components within normal limits  HEPATIC FUNCTION PANEL - Abnormal; Notable for the following components:   Total Protein 5.4 (*)    Albumin 3.0 (*)    Total Bilirubin 1.7 (*)    Bilirubin, Direct 0.5 (*)    Indirect Bilirubin 1.2 (*)    All other components within normal limits  PROCALCITONIN  TYPE AND SCREEN  PREPARE RBC (CROSSMATCH)  TROPONIN I (HIGH SENSITIVITY)     EKG Normal sinus rhythm, VR 86. PR 152, QRS 78, QTC 459. No acute ST elevations or depressions.   RADIOLOGY Chest x-ray: Moderate bilateral pleural effusions and bibasilar atelectasis   I also independently reviewed and agree with radiologist interpretations.   PROCEDURES:  Critical Care performed: Yes, see critical care procedure note(s)  .Critical Care  Performed by: Duffy Bruce, MD Authorized by: Duffy Bruce, MD   Critical care provider statement:    Critical care time (minutes):  30   Critical care time was exclusive of:  Separately billable procedures and treating other patients   Critical care was necessary to treat or prevent imminent or life-threatening deterioration of the following conditions:  Circulatory failure, sepsis, respiratory failure and cardiac failure   Critical care was time spent personally by me on the following activities:  Development of treatment plan with patient or surrogate, discussions with consultants, evaluation of patient's response to treatment, examination of patient, ordering and review of  laboratory studies, ordering and review of radiographic studies, ordering and performing treatments and interventions, pulse oximetry, re-evaluation of patient's condition and review of old [place] ED: Medications  albuterol (VENTOLIN HFA) 108 (90 Base) MCG/ACT inhaler 2 puff (has no administration in time range)  levothyroxine (SYNTHROID) tablet 50 mcg (has no administration in time range)  acetaminophen (TYLENOL) tablet 650 mg (has no administration in time range)    Or  acetaminophen (TYLENOL) suppository 650 mg (has no administration in time range)  ondansetron (ZOFRAN) tablet 4 mg (has no administration in time range)    Or  ondansetron (ZOFRAN) injection 4 mg (has no administration in time range)  furosemide (LASIX) injection 20 mg (20 mg Intravenous Given 08/03/22 0121)  insulin aspart (novoLOG) injection 0-15 Units (has no administration in time range)  0.9 %  sodium chloride infusion (10 mL/hr Intravenous New Bag/Given 08/02/22 2322)     IMPRESSION / MDM / [place] / ED COURSE  I reviewed the triage vital signs and the nursing notes.                               The patient is on the cardiac monitor to evaluate for evidence of arrhythmia and/or significant heart rate changes.   Ddx:  Differential includes the following, with pertinent life- or limb-threatening emergencies considered:  Recurrent malignant pleural effusions in the setting of gastric lymphoma, CHF, COPD, pneumonia with empyema, ACS, PE  Patient's presentation is most consistent with acute presentation with potential threat to life or bodily function.  MDM:  76 yo F with complex PMHx as above here with recurrent SOB. Suspect multifactorial acute on chronic hypoxia 2/2 recurrent pleural effusions (CHF and malignancy), also anemia in setting of lymphoma. Labs, imaging reviewed. BNP elevated, CXR c/w CHF with effusions and she does appear mildly hypervolemic on exam. Pt also  has acute on chronic anemia which I suspect is contributing to her sx. EKG nonischemic and trop is negative. Procal <0.25 and she does not appear infected clinically. Cr is at baseline.   Will transfuse, admit for further management - likely will need diuresis and possibly repeat thora  MEDICATIONS GIVEN IN ED: Medications  albuterol (VENTOLIN HFA) 108 (90 Base) MCG/ACT inhaler 2 puff (has no administration in time range)  levothyroxine (SYNTHROID) tablet 50 mcg (has no administration in time range)  acetaminophen (TYLENOL) tablet 650 mg (has no  administration in time range)    Or  acetaminophen (TYLENOL) suppository 650 mg (has no administration in time range)  ondansetron (ZOFRAN) tablet 4 mg (has no administration in time range)    Or  ondansetron (ZOFRAN) injection 4 mg (has no administration in time range)  furosemide (LASIX) injection 20 mg (20 mg Intravenous Given 08/03/22 0121)  insulin aspart (novoLOG) injection 0-15 Units (has no administration in time range)  0.9 %  sodium chloride infusion (10 mL/hr Intravenous New Bag/Given 08/02/22 2322)     Consults:  Hospitalist   EMR reviewed  Prior ED records, recent thoracentesis notes     FINAL CLINICAL IMPRESSION(S) / ED DIAGNOSES   Final diagnoses:  Acute on chronic respiratory failure with hypoxia ([place])  Chronic bilateral pleural effusions  Acute on chronic anemia     Rx / DC Orders   ED Discharge Orders     None        Note:  This document was prepared using Dragon voice recognition software and may include unintentional dictation errors.   Duffy Bruce, MD 08/03/22 [phone]

## 2022-08-03 ENCOUNTER — Inpatient Hospital Stay: Payer: Medicare Other

## 2022-08-03 DIAGNOSIS — I5033 Acute on chronic diastolic (congestive) heart failure: Secondary | ICD-10-CM

## 2022-08-03 DIAGNOSIS — D649 Anemia, unspecified: Secondary | ICD-10-CM

## 2022-08-03 DIAGNOSIS — I509 Heart failure, unspecified: Secondary | ICD-10-CM

## 2022-08-03 DIAGNOSIS — J9621 Acute and chronic respiratory failure with hypoxia: Secondary | ICD-10-CM

## 2022-08-03 LAB — TYPE AND SCREEN
ABO/RH(D): O POS
Antibody Screen: NEGATIVE
Unit division: 0

## 2022-08-03 LAB — CBG MONITORING, ED
Glucose-Capillary: 117 mg/dL — ABNORMAL HIGH (ref 70–99)
Glucose-Capillary: 105 mg/dL — ABNORMAL HIGH (ref 70–99)
Glucose-Capillary: 101 mg/dL — ABNORMAL HIGH (ref 70–99)
Glucose-Capillary: 123 mg/dL — ABNORMAL HIGH (ref 70–99)
Glucose-Capillary: 122 mg/dL — ABNORMAL HIGH (ref 70–99)

## 2022-08-03 LAB — HEMOGLOBIN AND HEMATOCRIT, BLOOD
HCT: 23.5 % — ABNORMAL LOW (ref 36.0–46.0)
HCT: 25.2 % — ABNORMAL LOW (ref 36.0–46.0)
Hemoglobin: 7.2 g/dL — ABNORMAL LOW (ref 12.0–15.0)
Hemoglobin: 7.6 g/dL — ABNORMAL LOW (ref 12.0–15.0)

## 2022-08-03 LAB — BPAM RBC
Blood Product Expiration Date: 202311032359
ISSUE DATE / TIME: 202310092259
Unit Type and Rh: 5100

## 2022-08-03 LAB — SARS CORONAVIRUS 2 BY RT PCR: SARS Coronavirus 2 by RT PCR: POSITIVE — AB

## 2022-08-03 MED ORDER — INSULIN ASPART 100 UNIT/ML IJ SOLN
0.0000 [IU] | INTRAMUSCULAR | Status: DC
  Administered 2022-08-03 (×2): 2 [IU] via SUBCUTANEOUS
  Administered 2022-08-04: 4 [IU] via SUBCUTANEOUS
  Administered 2022-08-04: 2 [IU] via SUBCUTANEOUS
  Filled 2022-08-03 (×4): qty 1

## 2022-08-03 MED ORDER — AEROCHAMBER PLUS FLO-VU LARGE MISC
1.0000 | Freq: Once | Status: AC
  Administered 2022-08-03: 1
  Filled 2022-08-03 (×2): qty 1

## 2022-08-03 MED ORDER — ALBUTEROL SULFATE HFA 108 (90 BASE) MCG/ACT IN AERS: 1.0000 | INHALATION_SPRAY | RESPIRATORY_TRACT | Status: DC | PRN

## 2022-08-03 MED ORDER — ONDANSETRON HCL 4 MG/2ML IJ SOLN: 4.0000 mg | Freq: Four times a day (QID) | INTRAMUSCULAR | Status: DC | PRN

## 2022-08-03 MED ORDER — LEVOTHYROXINE SODIUM 50 MCG PO TABS
50.0000 ug | ORAL_TABLET | Freq: Every day | ORAL | Status: DC
  Administered 2022-08-03 – 2022-08-04 (×2): 50 ug via ORAL
  Filled 2022-08-03 (×2): qty 1

## 2022-08-03 MED ORDER — FUROSEMIDE 10 MG/ML IJ SOLN
20.0000 mg | Freq: Two times a day (BID) | INTRAMUSCULAR | Status: DC
  Administered 2022-08-03 (×3): 20 mg via INTRAVENOUS
  Filled 2022-08-03: qty 4
  Filled 2022-08-03: qty 2
  Filled 2022-08-03 (×2): qty 4

## 2022-08-03 MED ORDER — LIDOCAINE-PRILOCAINE 2.5-2.5 % EX CREA
TOPICAL_CREAM | Freq: Every day | CUTANEOUS | Status: DC | PRN
  Filled 2022-08-03: qty 5

## 2022-08-03 MED ORDER — ONDANSETRON HCL 4 MG PO TABS: 4.0000 mg | ORAL_TABLET | Freq: Four times a day (QID) | ORAL | Status: DC | PRN

## 2022-08-03 MED ORDER — ACETAMINOPHEN 650 MG RE SUPP: 650.0000 mg | Freq: Four times a day (QID) | RECTAL | Status: DC | PRN

## 2022-08-03 MED ORDER — ACETAMINOPHEN 325 MG PO TABS: 650.0000 mg | ORAL_TABLET | Freq: Four times a day (QID) | ORAL | Status: DC | PRN

## 2022-08-03 NOTE — Telephone Encounter (Signed)
Update: she's still in the ED.

## 2022-08-03 NOTE — Assessment & Plan Note (Signed)
Sliding scale insulin coverage 

## 2022-08-03 NOTE — Assessment & Plan Note (Signed)
BP soft Continue to monitor BP closely in view of IV diuretic treatment

## 2022-08-03 NOTE — ED Notes (Signed)
Transfusion completed, flushing line with NS '@10ml'$ /hr until clear.  Pure wick applied after lasix given IVP.

## 2022-08-03 NOTE — Assessment & Plan Note (Signed)
Not acutely exacerbated DuoNebs as needed 

## 2022-08-03 NOTE — Hospital Course (Signed)
Hospital Course: 76 y.o. Caucasian female with medical history significant for left stent minutes before getting type 2 diabetes mellitus, COPD on home O2 at 3 L, postherpetic neuralgia, tobacco use, recurrent pleural effusion and newly diagnosed gastric lymphoma and well as anemia and thrombocytopenia, treated for COVID a month ago with Paxlovid  who presented to the emergency room with acute onset of worsening dyspnea.  Patient also has recurrent pleural effusion believed secondary to her lymphoma with 850 mL of pleural fluid removed on 9/19.  She denies cough, chest pain, fever or chills. ED course and data review: BP 104/53 with pulse 81 respirations 24 and O2 sat 100% on 4 L.  Troponin 8, BNP 171, WBC 5 with hemoglobin 6.4 and platelets 98,000.  BMP unremarkable, bilirubin elevated at 1.7, COVID-positive EKG, personally viewed and interpreted showing NSR at 86 with no acute ST-T wave changes   Assessment and Plan:   Bilateral pleural effusion secondary to lymphoma. Discussed with cytology, pleural fluids study appear to support lymphoma.  Flow cytometry was sent out to confirm. Patient will need repeated thoracentesis in the future.  Patient is also referred to pulmonology as outpatient.   Acute respiratory failure with hypoxia (HCC) Acute on chronic diastolic congestive heart failure. COVID infection. Continue to complete Paxlovid. Echocardiogram showed ejection fraction 60 to 65%. Patient condition improving on diuretics, renal function has been stable/better.  Patient will need long-term oxygen which has been prescribed. At this point, we will continue oral torsemide with added potassium. Also added dexamethasone 6 mg oral daily.   Lymphoma. 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 Patient does not have iron or B12 deficiency.  Patient initially refused blood transfusion, was able to receive blood yesterday.  Hemoglobin still 7.6 today.  Discussed with Dr.  Janese Banks, she will follow-up with her next Wednesday.   Chronic kidney disease stage IIIa. Renal function still getting better with diuretics.   Hypothyroidism. New diagnosis, continue Synthroid.     Type 2 diabetes mellitus with chronic kidney disease Patient has elevated glucose due to steroids.  She will follow-up with PCP as outpatient. Recent hemoglobin A1c only 5.5 at 06/17/2022 I did not start outpatient treatment.   Chronic obstructive pulmonary disease (COPD) (HCC) No evidence of bronchospasm.

## 2022-08-03 NOTE — H&P (Signed)
History and Physical    Patient: Natasha [name] RKY:[id number] DOB: 09-Mar-1946 DOA: 08/02/2022 DOS: the patient was seen and examined on 08/03/2022 PCP: [name], NP  Patient coming from: Home  Chief Complaint:  Chief Complaint  Patient presents with   Shortness of Breath    HPI: Natasha [name] is a 76 y.o. female with medical history significant for type 2 diabetes mellitus, COPD on home O2 at 3 L, postherpetic neuralgia, tobacco use, recurrent pleural effusion and newly diagnosed gastric lymphoma and well as anemia and thrombocytopenia, treated for COVID a month ago with Paxlovid  who presented to the emergency room with acute onset of worsening dyspnea.  Patient also has recurrent pleural effusion believed secondary to her lymphoma with 850 mL of pleural fluid removed on 9/19.  She denies cough, chest pain, fever or chills. ED course and data review: BP 104/53 with pulse 81 respirations 24 and O2 sat 100% on 4 L.  Troponin 8, BNP 171, WBC 5 with hemoglobin 6.4 and platelets 98,000.  BMP unremarkable, bilirubin elevated at 1.7, COVID-positive EKG, personally viewed and interpreted showing NSR at 86 with no acute ST-T wave changes,    Chest x-ray showing moderate bilateral pleural effusions and bibasilar atelectasis similar to prior radiograph  Patient treated with albuterol HFA and hospitalist consulted for admission.   Review of Systems: As mentioned in the history of present illness. All other systems reviewed and are negative.  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 She started smoking about 61 years ago. She has a  110.00 pack-year smoking history. She has never used smokeless tobacco. She reports that she does not drink alcohol and does not use drugs.  No Known Allergies  Family History  Problem Relation Age of Onset   Breast cancer Mother    Alzheimer's disease Father    Lung cancer Father        lung   Varicose Veins Brother    Heart attack Brother     Prior to Admission medications   Medication Sig Start Date End Date Taking? Authorizing Provider  insulin NPH-regular Human (70-30) 100 UNIT/ML injection Inject 5-10 Units into the skin 2 (two) times daily with a meal. Use only for hyperglycemia, CBG+ 300. Start with 5 units, titrate up to max 10 unit per dose if need. 07/17/22  Yes Karamalegos, ADevonne Doughty DO  Ipratropium-Albuterol (COMBIVENT RESPIMAT) 20-100 MCG/ACT AERS respimat Inhale 1 puff into the lungs every 6 (six) hours.   Yes [provider]  levothyroxine (SYNTHROID) 50 MCG tablet Take 1 tablet (50 mcg total) by mouth daily at 6 (six) AM. 07/17/22  Yes ZSharen Hones MD  lidocaine-prilocaine (EMLA) cream Apply to affected area once 07/28/22  Yes RSindy Guadeloupe MD  Naproxen Sod-diphenhydrAMINE 220-25 MG TABS Take by mouth.   Yes [provider]  nicotine (NICODERM CQ - DOSED IN MG/24 HOURS) 21 mg/24hr patch Place 1 patch (21 mg total) onto the skin daily. 07/20/22  Yes RSindy Guadeloupe MD  torsemide ([facility] 20  MG tablet Take 1 tablet (20 mg total) by mouth daily. 07/16/22  Yes Sharen Hones, MD  acyclovir (ZOVIRAX) 400 MG tablet Take 1 tablet (400 mg total) by mouth daily. Patient not taking: Reported on 08/03/2022 07/28/22   Sindy Guadeloupe, MD  albuterol (VENTOLIN HFA) 108 (90 Base) MCG/ACT inhaler TAKE 2 PUFFS BY MOUTH EVERY 6 HOURS AS NEEDED FOR WHEEZE OR SHORTNESS OF BREATH 06/17/22   Sreenath, Sudheer B, MD  betamethasone valerate ointment (VALISONE) 0.1 % Apply topically 2 (two) times daily. Patient not taking: Reported on 08/03/2022 07/07/22   [provider]   dexamethasone (DECADRON) 4 MG tablet Take 2 tablets (8 mg total) by mouth daily. Start the day after bendamustine chemotherapy for 2 days. Take with food. Patient not taking: Reported on 08/03/2022 07/28/22   Sindy Guadeloupe, MD  Insulin Syringe-Needle U-100 31G X 15/64" 0.3 ML MISC Use to inject insulin twice a day with meals. 07/17/22   Karamalegos, Devonne Doughty, DO  loperamide (IMODIUM) 2 MG capsule Take 2 mg by mouth as needed for diarrhea or loose stools.    [provider]  metFORMIN (GLUCOPHAGE) 500 MG tablet Take 1 tablet (500 mg total) by mouth 2 (two) times daily with a meal. Patient not taking: Reported on 08/03/2022 07/23/22   [name], NP  ondansetron (ZOFRAN) 8 MG tablet Take 1 tablet (8 mg total) by mouth every 8 (eight) hours as needed for nausea or vomiting. Start on the third day after chemotherapy. Patient not taking: Reported on 08/03/2022 07/28/22   Sindy Guadeloupe, MD  potassium chloride (KLOR-CON M) 10 MEQ tablet Take 1 tablet (10 mEq total) by mouth 2 (two) times daily. Patient not taking: Reported on 08/03/2022 07/16/22   Sharen Hones, MD  prochlorperazine (COMPAZINE) 10 MG tablet Take 1 tablet (10 mg total) by mouth every 6 (six) hours as needed for nausea or vomiting. Patient not taking: Reported on 08/03/2022 07/28/22   Sindy Guadeloupe, MD  Simethicone 180 MG CAPS Take by mouth.    [provider]    Physical Exam: Vitals:   08/02/22 2319 08/02/22 2322 08/03/22 0000 08/03/22 0030  BP: (!) 98/59 (!) 95/57 107/63 104/64  Pulse: 95 93 93 89  Resp: (!) 21 (!) 23    Temp: 98.1 F (36.7 C) 98.4 F (36.9 C)    TempSrc: Oral Oral    SpO2: 97% 98% 100% 99%   Physical Exam Vitals and nursing note reviewed.  Constitutional:      General: She is not in acute distress.    Comments: Conversational dyspnea  HENT:     Head: Normocephalic and atraumatic.  Cardiovascular:     Rate and Rhythm: Normal rate and regular rhythm.     Heart sounds: Normal  heart sounds.  Pulmonary:     Effort: Tachypnea and accessory muscle usage present.     Breath sounds: Examination of the right-middle field reveals decreased breath sounds. Examination of the left-middle field reveals decreased breath sounds. Examination of the right-lower field reveals decreased breath sounds. Examination of the left-lower field reveals decreased breath sounds. Decreased breath sounds present.  Abdominal:     Palpations: Abdomen is soft.     Tenderness: There is no abdominal tenderness.  Neurological:     Mental Status: Mental status is at baseline.     Labs on Admission: I have personally reviewed following labs and imaging studies  CBC: Recent Labs  Lab 07/28/22 1236 08/02/22 1623  WBC 5.1 5.0  NEUTROABS 0.8*  --   HGB 6.7* 6.4*  HCT 21.9* 21.6*  MCV 93.2 95.2  PLT 88* 98*   Basic Metabolic Panel: Recent Labs  Lab 07/28/22 1236 08/02/22 1623  NA 137 137  K 4.0 4.2  CL 100 104  CO2 31 27  GLUCOSE 246* 156*  BUN 44* 38*  CREATININE 1.15* 0.96  CALCIUM 9.5 9.4   GFR: Estimated Creatinine Clearance: 46.5 mL/min (by C-G formula based on SCr of 0.96 mg/dL). Liver Function Tests: Recent Labs  Lab 07/28/22 1236 08/02/22 2106  AST 19 21  ALT 12 12  ALKPHOS 126 126  BILITOT 1.2 1.7*  PROT 5.3* 5.4*  ALBUMIN 2.8* 3.0*   No results for input(s): "LIPASE", "AMYLASE" in the last 168 hours. No results for input(s): "AMMONIA" in the last 168 hours. Coagulation Profile: No results for input(s): "INR", "PROTIME" in the last 168 hours. Cardiac Enzymes: No results for input(s): "CKTOTAL", "CKMB", "CKMBINDEX", "TROPONINI" in the last 168 hours. BNP (last 3 results) No results for input(s): "PROBNP" in the last 8760 hours. HbA1C: No results for input(s): "HGBA1C" in the last 72 hours. CBG: No results for input(s): "GLUCAP" in the last 168 hours. Lipid Profile: No results for input(s): "CHOL", "HDL", "LDLCALC", "TRIG", "CHOLHDL", "LDLDIRECT" in the  last 72 hours. Thyroid Function Tests: No results for input(s): "TSH", "T4TOTAL", "FREET4", "T3FREE", "THYROIDAB" in the last 72 hours. Anemia Panel: No results for input(s): "VITAMINB12", "FOLATE", "FERRITIN", "TIBC", "IRON", "RETICCTPCT" in the last 72 hours. Urine analysis:    Component Value Date/Time   COLORURINE AMBER (A) 06/11/2018 1308   APPEARANCEUR CLOUDY (A) 06/11/2018 1308   LABSPEC 1.022 06/11/2018 1308   PHURINE 5.0 06/11/2018 1308   GLUCOSEU >=500 (A) 06/11/2018 1308   HGBUR LARGE (A) 06/11/2018 1308   BILIRUBINUR NEGATIVE 06/11/2018 1308   KETONESUR NEGATIVE 06/11/2018 1308   PROTEINUR 30 (A) 06/11/2018 1308   NITRITE NEGATIVE 06/11/2018 1308   LEUKOCYTESUR LARGE (A) 06/11/2018 1308    Radiological Exams on Admission: DG Chest 2 View  Result Date: 08/02/2022 CLINICAL DATA:  Shortness of breath EXAM: CHEST - 2 VIEW COMPARISON:  Chest x-ray dated July 13, 2022 FINDINGS: Visualized cardiac and mediastinal contours are unchanged. Right lung granuloma. Moderate bilateral pleural effusions and bibasilar atelectasis. No evidence of pneumothorax. IMPRESSION: Moderate bilateral pleural effusions and bibasilar atelectasis, similar to prior radiograph. Electronically Signed   By: Yetta Glassman M.D.   On: 08/02/2022 16:46     Data Reviewed: Relevant notes from primary care and specialist visits, past discharge summaries as available in EHR, including Care Everywhere. Prior diagnostic testing as pertinent to current admission diagnoses Updated medications and problem lists for reconciliation ED course, including vitals, labs, imaging, treatment and response to treatment Triage notes, nursing and pharmacy notes and ED provider's notes Notable results as noted in HPI   Assessment and Plan: Acute on chronic diastolic CHF (congestive heart failure) (HCC) Recurrent pleural effusion Acute on chronic respiratory failure Patient requiring 4 to 5 L up from her baseline of 3  L, likely triggered by recurrent pleural effusion IR consult for thoracentesis Continue Lasix Daily weights and intake and output monitoring Echocardiogram done recently shows EF 60 to 65%  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 Hemoglobin 6.4 and platelets 98,000 Getting a unit PRBC in the ED Continue to monitor  Hypotension BP soft Continue to monitor BP closely in view of IV diuretic treatment  Hypothyroidism Continue levothyroxine  Diabetes mellitus, type  II ([place]) Sliding scale insulin coverage  Chronic obstructive pulmonary disease (COPD) ([place]) Not acutely exacerbated DuoNebs as needed        DVT prophylaxis: Lovenox  Consults: IR for thoracentesis   Advance Care Planning:   Code Status: Prior   Family Communication: none  Disposition Plan: Back to previous home environment  Severity of Illness: The appropriate patient status for this patient is INPATIENT. Inpatient status is judged to be reasonable and necessary in order to provide the required intensity of service to ensure the patient's safety. The patient's presenting symptoms, physical exam findings, and initial radiographic and laboratory data in the context of their chronic comorbidities is felt to place them at high risk for further clinical deterioration. Furthermore, it is not anticipated that the patient will be medically stable for discharge from the hospital within 2 midnights of admission.   * I certify that at the point of admission it is my clinical judgment that the patient will require inpatient hospital care spanning beyond 2 midnights from the point of admission due to high intensity of service, high risk for further deterioration and high frequency of surveillance required.*  Author: Athena Masse, MD 08/03/2022 1:00 AM  For on call review [email or web address].

## 2022-08-03 NOTE — ED Notes (Signed)
Pt use of call light reporting difficulty breathing, O2 off and SaO2 82%.  Replaced [place] on pt, called RT who states they do not have spacers.  Message sent to Pharmacy for albuterol and spacer ASAP.

## 2022-08-03 NOTE — Assessment & Plan Note (Signed)
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

## 2022-08-03 NOTE — Procedures (Signed)
PROCEDURE SUMMARY:  Successful US guided right thoracentesis. Yielded 1 L of clear yellow fluid. Pt tolerated procedure well. No immediate complications.  Specimen sent for labs. CXR ordered; no post-procedure pneumothorax identified  EBL < 2 mL  Theresa Duty, NP 08/03/2022 12:38 PM

## 2022-08-03 NOTE — ED Notes (Signed)
Patient transported to Ultrasound 

## 2022-08-03 NOTE — ED Notes (Signed)
Blood infusing without any complaints or problems noted. Rate increased to 257m/hr.

## 2022-08-03 NOTE — ED Notes (Signed)
Incontinent care provided, linens changed.

## 2022-08-03 NOTE — ED Notes (Signed)
Incontinent care provided with linen change

## 2022-08-03 NOTE — Assessment & Plan Note (Signed)
Continue levothyroxine 

## 2022-08-03 NOTE — Assessment & Plan Note (Addendum)
Recurrent pleural effusion Acute on chronic respiratory failure Patient requiring 4 to 5 L up from her baseline of 3 L, likely triggered by recurrent pleural effusion IR consult for thoracentesis Continue Lasix Daily weights and intake and output monitoring Echocardiogram done recently shows EF 60 to 65%

## 2022-08-03 NOTE — Progress Notes (Signed)
Same day as admission, brief rounding note.  76 year old female with past medical history of type 2 diabetes, COPD chronically on 3 L/min home oxygen, recurrent pleural effusion with recently diagnosed gastric lymphoma, anemia and thrombocytopenia, COVID a month ago treated with Paxlovid, postherpetic neuralgia, tobacco.  Patient admitted after midnight with worsening shortness of breath in setting of recurrent pleural effusion and decompensated HFpEF.  Interval history -patient seen in the ED holding for bed this morning.  She had not yet gone for thoracentesis.  She reports still feeling short of breath although slightly better since getting Lasix.  Reports good urine output.  No other acute complaints at this time.  Confirms her lymphoma diagnosis and that she does not plan to pursue any therapies other than symptomatic.  She denies any COVID symptoms including fevers chills, or recent sick contacts.  She had over 3 weeks ago and had no symptoms at that time either.  She was surprised she tested positive again.   Exam -  General exam: awake, alert, no acute distress, chronically ill-appearing HEENT:moist mucus membranes, hearing grossly normal  Respiratory system: Diminished bases right more than left, no wheezes, normal respiratory effort at rest, on 5 L/min [place] O2. Cardiovascular system: normal S1/S2, RRR, no JVD, murmurs, rubs, gallops, no pedal edema.   Gastrointestinal system: soft, NT, ND, no HSM felt, +bowel sounds. Central nervous system: A&O x3. no gross focal neurologic deficits, normal speech Extremities: moves all, no edema, normal tone Skin: dry, intact, normal temperature Psychiatry: normal mood, congruent affect, judgement and insight appear normal   Assessment and plan -  As outlined in H&P by Dr. Damita Dunnings.   I have reviewed and agree with the assessment and plan as outlined with any changes or additions as below.  Ultrasound-guided thoracentesis today with 1 L fluid removed  from the right pleural space.  Posttransfusion hemoglobin 7.2 from 6.4. -- Continue to trend hemoglobin and transfuse if less than 7  Recurrent pleural effusion and acute on chronic HFpEF -- Continue diuresis for now --wean O2 as tolerated   No charge note    No charge

## 2022-08-04 ENCOUNTER — Ambulatory Visit: Payer: Medicare Other

## 2022-08-04 ENCOUNTER — Other Ambulatory Visit: Payer: Medicare Other

## 2022-08-04 ENCOUNTER — Ambulatory Visit: Payer: Medicare Other | Admitting: Oncology

## 2022-08-04 DIAGNOSIS — I5030 Unspecified diastolic (congestive) heart failure: Secondary | ICD-10-CM | POA: Diagnosis not present

## 2022-08-04 LAB — COMPREHENSIVE METABOLIC PANEL
ALT: 10 U/L (ref 0–44)
AST: 22 U/L (ref 15–41)
Albumin: 2.7 g/dL — ABNORMAL LOW (ref 3.5–5.0)
Alkaline Phosphatase: 117 U/L (ref 38–126)
Anion gap: 8 (ref 5–15)
BUN: 44 mg/dL — ABNORMAL HIGH (ref 8–23)
CO2: 30 mmol/L (ref 22–32)
Calcium: 8.6 mg/dL — ABNORMAL LOW (ref 8.9–10.3)
Chloride: 101 mmol/L (ref 98–111)
Creatinine, Ser: 1.47 mg/dL — ABNORMAL HIGH (ref 0.44–1.00)
GFR, Estimated: 37 mL/min — ABNORMAL LOW (ref >60)
Glucose, Bld: 149 mg/dL — ABNORMAL HIGH (ref 70–99)
Potassium: 3.6 mmol/L (ref 3.5–5.1)
Sodium: 139 mmol/L (ref 135–145)
Total Bilirubin: 1.7 mg/dL — ABNORMAL HIGH (ref 0.3–1.2)
Total Protein: 4.7 g/dL — ABNORMAL LOW (ref 6.5–8.1)

## 2022-08-04 LAB — GLUCOSE, CAPILLARY
Glucose-Capillary: 132 mg/dL — ABNORMAL HIGH (ref 70–99)
Glucose-Capillary: 165 mg/dL — ABNORMAL HIGH (ref 70–99)
Glucose-Capillary: 81 mg/dL (ref 70–99)
Glucose-Capillary: 99 mg/dL (ref 70–99)

## 2022-08-04 LAB — CBC
HCT: 24.9 % — ABNORMAL LOW (ref 36.0–46.0)
Hemoglobin: 7.8 g/dL — ABNORMAL LOW (ref 12.0–15.0)
MCH: 28.5 pg (ref 26.0–34.0)
MCHC: 31.3 g/dL (ref 30.0–36.0)
MCV: 90.9 fL (ref 80.0–100.0)
Platelets: 103 10*3/uL — ABNORMAL LOW (ref 150–400)
RBC: 2.74 MIL/uL — ABNORMAL LOW (ref 3.87–5.11)
RDW: 18 % — ABNORMAL HIGH (ref 11.5–15.5)
WBC: 5.3 10*3/uL (ref 4.0–10.5)
nRBC: 1.7 % — ABNORMAL HIGH (ref 0.0–0.2)

## 2022-08-04 LAB — MISC LABCORP TEST (SEND OUT): Labcorp test code: 115005

## 2022-08-04 LAB — MAGNESIUM: Magnesium: 1.9 mg/dL (ref 1.7–2.4)

## 2022-08-04 NOTE — Progress Notes (Signed)
Mobility Specialist - Progress Note   08/04/22 1451  Mobility  Activity Ambulated with assistance in hallway;Stood at bedside;Dangled on edge of bed  Level of Assistance Contact guard assist, steadying assist  Assistive Device None  Distance Ambulated (ft) 120 ft  Activity Response Tolerated well  $Mobility charge 1 Mobility    Pre-mobility: HR = 88 SpO2 = 100 (With O2) During mobility: HR = 102 SpO2 = 79 (Without O2)  Post-mobility: HR = 96 SPO2 = 98 (With O2)   Pt EOB on 3L upon arrival. Pt STS indep and ambulates in hallway CGA. Pt has one LOB corrected by CGA. Pt denies SOB but SPO2 drops to 79 at the end of ambulation. Stats quickly return to normal once at rest.Pt returns to EOB with needs in reach and bed alarm set.   Gretchen Short  Mobility Specialist  08/04/22 2:55 PM

## 2022-08-04 NOTE — Consult Note (Signed)
   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  Mildly elevated pulmonary artery systolic pressures.  Mild mitral regurgitation.  Mild to moderate tricuspid regurgitation.  She presented to the emergency room with worsening shortness of breath, DOE, orthopnea and worsening lower extremity edema.  BNP was 171.  Chest x-ray revealed moderate bilateral pleural effusions.  Was last discharged from the hospital on September 22 and during that hospitalization had undergone thoracentesis where 850 mL was drained from the right.  Comorbidities:   Type 2 diabetes COPD on 3 L chronically Previous tobacco abuse Gastric lymphoma Anemia Chronic kidney disease stage III  Medications:  Levothyroxine 50 mcg daily Lasix 20 mg IV 2 times a day  Labs:  Sodium 139, potassium 3.6, chloride 101, CO2 30, BUN 44, creatinine 1.47, estimated GFR 37 down from 50. Weights not documented Intake not documented Output 1850 mL   Initial meeting with patient, she was sitting up in the chair at bedside.  She states that the doctor was just in and tells her that she is going to be discharged later this evening.  She states she is going to be discharged home on Demadex, which she has taken in the past.  Explained who I was and why I was there to speak to her.  She states she had not been told she has heart failure.  Explained that HF means the heart as a pump can not meet the demands of the body but that her heart has not failed her.  Discussed her symptoms that brought her in.  Discussed daily weights, when to do and the reasoning behind.  Directed to report 2 to 3 pound weight gain overnight or total of 5 pounds within the week.  States prior to this admission she had just been weighing herself every Sunday.  Discussed salt and sodium intake, restricting to no more than 2000 mg in a day.  States that she uses very little salt.  Went over food types to avoid.  Also discussed fluid restriction of no more  than 64 ounces in a 24-hour time period.  She states that she does not drink water, but will drink sodas, chocolate milk and grape juice.  Again stressed no more than eight 8 ounce cups or the 64 ounces in a days time.  She asked what do I do when I get thirsty, explained to use hard candy, gum, ice chips which 1 cup of ice equals 1/2 cup of liquid.  Also stating taking small sips rather than gulps and to brush teeth frequently and use mouthwash also helps.  Also stressed that if she takes in more fluids than what is allowed it defeats the purpose of her taking a water pill.  She voices understanding.  Discussed follow-up in the outpatient heart failure clinic for which she has an appointment on October 16 at 3 PM.  She has a 10% no-show which is 6 out of 61 appointments.  She also verbalized that she is not sure she is going to go through with the treatment for her lymphoma as she did not like the side effects.  I asked her to please talk with her healthcare provider and get all her questions answered.  She was given the living with heart failure teaching booklet, zone magnet, info on low sodium and heart failure along with weight chart.  Pricilla Riffle RN CHFN

## 2022-08-04 NOTE — TOC Transition Note (Signed)
Transition of Care [facility]) - CM/SW Discharge Note   Patient Details  Name: Natasha [name] MRN: [id number] Date of Birth: 1945/12/09  Transition of Care [facility]) CM/SW Contact:  Candie Chroman, LCSW Phone Number: 08/04/2022, 2:54 PM   Clinical Narrative:  Patient has orders to discharge home today. Readmission prevention screen complete. Patient on airborne precautions. CSW called her in the room, introduced role, and explained that discharge planning would be discussed. PCP is [name], NP. Daughter drives her to appointments. [facility] [address]. No issues obtaining medications. Patient was active with [facility] prior to admission. Liaison is aware of plan for discharge today. Patient has home oxygen through [employer]. No other DME use prior to admission. No further concerns. Her daughter will transport her home today and bring her oxygen. CSW signing off.   Final next level of care: Home w Home Health Services Barriers to Discharge: No Barriers Identified   Patient Goals and CMS Choice   CMS Medicare.gov Compare Post Acute Care list provided to::  (N/A) Choice offered to / list presented to : NA  Discharge Placement                Patient to be transferred to facility by: Daughter      Discharge Plan and Services                          HH Arranged: RN, PT, OT [facility] Agency: [place] Date [place]: 08/04/22   Representative spoke with at [place]: [place] ([place]) Interventions     Readmission Risk Interventions    08/04/2022    2:54 PM  Readmission Risk Prevention Plan  Transportation Screening Complete  PCP or Specialist Appt within 5-7 Days Complete  Home Care Screening Complete  Medication Review (RN CM) Complete

## 2022-08-04 NOTE — Discharge Summary (Signed)
Natasha [name] MVH:[id number] DOB: 1946-08-04 DOA: 08/02/2022  PCP: [name], NP  Admit date: 08/02/2022 Discharge date: 08/04/2022  Time spent: 35 minutes  Recommendations for Outpatient Follow-up:  Oncology follow-up as scheduled  Bmp at f/u    Discharge Diagnoses:  Principal Problem:   CHF exacerbation [facility]) Active Problems:   Recurrent pleural effusion   Acute on chronic diastolic CHF (congestive heart failure) (HCC)   Acute on chronic respiratory failure with hypoxia (HCC)   Acute on chronic anemia   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HCC)   Chronic obstructive pulmonary disease (COPD) ([place])   Diabetes mellitus, type II ([place])   Thrombocytopenia ([place])   Hypothyroidism   Hypotension   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  History of present illness:  From admission h and p  Natasha [name] is a 76 y.o. female with medical history significant for type 2 diabetes mellitus, COPD on home O2 at 3 L, postherpetic neuralgia, tobacco use, recurrent pleural effusion and newly diagnosed gastric lymphoma and well as anemia and thrombocytopenia, treated for COVID a month ago with Paxlovid  who presented to the emergency room with acute onset of worsening dyspnea.  Patient also has recurrent pleural effusion believed secondary to her lymphoma with 850 mL of pleural fluid removed on 9/19.  She denies cough, chest pain, fever or chills.  Hospital Course:  Patient presented with symptomatic pleural effusion likely 2/2 known lymphoma. O2 requirement of 5 liters from baseline of 3. Thoracentesis performed yielding 1 L, culture ngtd. Symptomatically improved and back to 3 L O2. Ambulated with nursing. Also received 1 unit prbcs in the ED for hgb in the 6s, this improved to 7s after transfusion and stable there. No bleeding, this anemia likely 2/2 known lymphoma. Has close oncology f/u  scheduled. Patient was treated here with lasix and this caused a mild AKI. We will hold home diuretic and advise bmp at oncology f/u next week.   Procedures: thoracentesis   Consultations: IR  Discharge Exam: Vitals:   08/04/22 0841 08/04/22 1241  BP: (!) 99/59 (!) 93/54  Pulse: 84 86  Resp: 18 20  Temp: 97.8 F (36.6 C) 98.4 F (36.9 C)  SpO2: 98% 97%    General: NAD Cardiovascular: RRR Respiratory: decreased breath sounds at bases  Discharge Instructions   Discharge Instructions     Diet - low sodium heart healthy   Complete by: As directed    Diet - low sodium heart healthy   Complete by: As directed    Increase activity slowly   Complete by: As directed    Increase activity slowly   Complete by: As directed       Allergies as of 08/04/2022   No Known Allergies      Medication List     STOP taking these medications    acyclovir 400 MG tablet Commonly known as: ZOVIRAX   betamethasone valerate ointment 0.1 % Commonly known as: VALISONE   dexamethasone 4 MG tablet Commonly known as: DECADRON   metFORMIN 500 MG tablet Commonly known as: GLUCOPHAGE   ondansetron 8 MG tablet Commonly known as: Zofran   potassium chloride 10 MEQ tablet Commonly known as: KLOR-CON M   prochlorperazine 10 MG tablet Commonly known as: COMPAZINE   torsemide 20 MG tablet Commonly known as: DEMADEX       TAKE these medications    albuterol 108 (90  Base) MCG/ACT inhaler Commonly known as: VENTOLIN HFA TAKE 2 PUFFS BY MOUTH EVERY 6 HOURS AS NEEDED FOR WHEEZE OR SHORTNESS OF BREATH   Combivent Respimat 20-100 MCG/ACT Aers respimat Generic drug: Ipratropium-Albuterol Inhale 1 puff into the lungs every 6 (six) hours.   insulin NPH-regular Human (70-30) 100 UNIT/ML injection Inject 5-10 Units into the skin 2 (two) times daily with a meal. Use only for hyperglycemia, CBG+ 300. Start with 5 units, titrate up to max 10 unit per dose if need.   Insulin  Syringe-Needle U-100 31G X 15/64" 0.3 ML Misc Use to inject insulin twice a day with meals.   levothyroxine 50 MCG tablet Commonly known as: SYNTHROID Take 1 tablet (50 mcg total) by mouth daily at 6 (six) AM.   lidocaine-prilocaine cream Commonly known as: EMLA Apply to affected area once   loperamide 2 MG capsule Commonly known as: IMODIUM Take 2 mg by mouth as needed for diarrhea or loose stools.   Naproxen Sod-diphenhydrAMINE 220-25 MG Tabs Take by mouth.   nicotine 21 mg/24hr patch Commonly known as: NICODERM CQ - dosed in mg/24 hours Place 1 patch (21 mg total) onto the skin daily.   Simethicone 180 MG Caps Take by mouth.       No Known Allergies  Follow-up Information     Sindy Guadeloupe, MD Follow up.   Specialty: Oncology Why: as scheduled Contact information: [place] [id number] [phone]                  The results of significant diagnostics from this hospitalization (including imaging, microbiology, ancillary and laboratory) are listed below for reference.    Significant Diagnostic Studies: US THORACENTESIS ASP PLEURAL SPACE W/IMG GUIDE  Result Date: 08/03/2022 INDICATION: Patient with a history of gastric lymphoma with recurrent pleural effusions. Interventional radiology asked to perform a diagnostic and therapeutic thoracentesis. EXAM: ULTRASOUND GUIDED RIGHT THORACENTESIS MEDICATIONS: 1% lidocaine 10 mL COMPLICATIONS: None immediate. PROCEDURE: An ultrasound guided thoracentesis was thoroughly discussed with the patient and questions answered. The benefits, risks, alternatives and complications were also discussed. The patient understands and wishes to proceed with the procedure. Written consent was obtained. Ultrasound was performed to localize and mark an adequate pocket of fluid in the right chest. The area was then prepped and draped in the normal sterile fashion. 1% Lidocaine was used for local anesthesia. Under  ultrasound guidance a 6 Fr Safe-T-Centesis catheter was introduced. Thoracentesis was performed. The catheter was removed and a dressing applied. FINDINGS: A total of approximately 1 L of clear yellow fluid was removed. Samples were sent to the laboratory as requested by the clinical team. IMPRESSION: Successful ultrasound guided right thoracentesis yielding 1 L of pleural fluid. Read by: Soyla Dryer, NP Electronically Signed   By: Ruthann Cancer M.D.   On: 08/03/2022 13:01   DG Chest Port 1 View  Result Date: 08/03/2022 CLINICAL DATA:  Status post thoracentesis. EXAM: PORTABLE CHEST 1 VIEW COMPARISON:  08/02/2022 FINDINGS: 1151 hours. No evidence for pneumothorax. Cardiopericardial silhouette is at upper limits of normal for size. Vascular congestion with diffuse interstitial opacity. There is bibasilar atelectasis with small bilateral pleural effusions, decreased in the interval. Telemetry leads overlie the chest. IMPRESSION: 1. No evidence for pneumothorax status post thoracentesis. 2. Interval decrease in bilateral pleural effusions. Electronically Signed   By: Misty Stanley M.D.   On: 08/03/2022 12:07   DG Chest 2 View  Result Date: 08/02/2022 CLINICAL DATA:  Shortness of breath  EXAM: CHEST - 2 VIEW COMPARISON:  Chest x-ray dated July 13, 2022 FINDINGS: Visualized cardiac and mediastinal contours are unchanged. Right lung granuloma. Moderate bilateral pleural effusions and bibasilar atelectasis. No evidence of pneumothorax. IMPRESSION: Moderate bilateral pleural effusions and bibasilar atelectasis, similar to prior radiograph. Electronically Signed   By: Yetta Glassman M.D.   On: 08/02/2022 16:46   ECHOCARDIOGRAM COMPLETE  Result Date: 07/13/2022    ECHOCARDIOGRAM REPORT   Patient Name:   Natasha [name] Date of Exam: 07/13/2022 Medical Rec #:  [id number]      Height:       62.0 in Accession #:    [id number]     Weight:       170.2 lb Date of Birth:  [birth year]       BSA:          1.785 m  Patient Age:    12 years       BP:           90/59 mmHg Patient Gender: F              HR:           80 bpm. Exam Location:  [facility] Procedure: 2D Echo, Cardiac Doppler and Color Doppler Indications:     CHF-acute dastolic S06.30  History:         Patient has no prior history of Echocardiogram examinations.                  Risk Factors:Diabetes. Pleural effusion.  Sonographer:     Sherrie Sport Referring Phys:  [id number] JAN A MANSY Diagnosing Phys: Nelva Bush MD  Sonographer Comments: Suboptimal parasternal window. IMPRESSIONS  1. Left ventricular ejection fraction, by estimation, is 60 to 65%. The left ventricle has normal function. The left ventricle has no regional wall motion abnormalities. Left ventricular diastolic parameters were normal.  2. Right ventricular systolic function is normal. The right ventricular size is normal. There is mildly elevated pulmonary artery systolic pressure.  3. The mitral valve is normal in structure. Mild mitral valve regurgitation. No evidence of mitral stenosis.  4. Tricuspid valve regurgitation is mild to moderate.  5. The aortic valve has an indeterminant number of cusps. There is mild thickening of the aortic valve. Aortic valve regurgitation is not visualized. Aortic valve sclerosis is present, with no evidence of aortic valve stenosis.  6. The inferior vena cava is normal in size with greater than 50% respiratory variability, suggesting right atrial pressure of 3 mmHg. FINDINGS  Left Ventricle: Left ventricular ejection fraction, by estimation, is 60 to 65%. The left ventricle has normal function. The left ventricle has no regional wall motion abnormalities. The left ventricular internal cavity size was normal in size. There is  no left ventricular hypertrophy. Left ventricular diastolic parameters were normal. Right Ventricle: The right ventricular size is normal. No increase in right ventricular wall thickness. Right ventricular systolic function is normal. There is mildly  elevated pulmonary artery systolic pressure. The tricuspid regurgitant velocity is 3.02  m/s, and with an assumed right atrial pressure of 3 mmHg, the estimated right ventricular systolic pressure is 23.5 mmHg. Left Atrium: Left atrial size was normal in size. Right Atrium: Right atrial size was normal in size. Pericardium: There is no evidence of pericardial effusion. Mitral Valve: The mitral valve is normal in structure. Mild mitral valve regurgitation. No evidence of mitral valve stenosis. Tricuspid Valve: The tricuspid valve is normal in structure. Tricuspid valve regurgitation is mild  to moderate. Aortic Valve: The aortic valve has an indeterminant number of cusps. There is mild thickening of the aortic valve. Aortic valve regurgitation is not visualized. Aortic valve sclerosis is present, with no evidence of aortic valve stenosis. Aortic valve mean gradient measures 8.0 mmHg. Aortic valve peak gradient measures 14.3 mmHg. Aortic valve area, by VTI measures 2.57 cm. Pulmonic Valve: The pulmonic valve was not well visualized. Pulmonic valve regurgitation is not visualized. No evidence of pulmonic stenosis. Aorta: The aortic root is normal in size and structure. Pulmonary Artery: The pulmonary artery is not well seen. Venous: The inferior vena cava is normal in size with greater than 50% respiratory variability, suggesting right atrial pressure of 3 mmHg. IAS/Shunts: The interatrial septum was not well visualized.  LEFT VENTRICLE PLAX 2D LVIDd:         4.00 cm   Diastology LVIDs:         2.40 cm   LV e' medial:    10.90 cm/s LV PW:         1.00 cm   LV E/e' medial:  9.0 LV IVS:        0.70 cm   LV e' lateral:   12.90 cm/s LVOT diam:     2.00 cm   LV E/e' lateral: 7.6 LV SV:         77 LV SV Index:   43 LVOT Area:     3.14 cm  RIGHT VENTRICLE RV Basal diam:  3.05 cm RV Mid diam:    3.10 cm LEFT ATRIUM             Index        RIGHT ATRIUM           Index LA diam:        4.20 cm 2.35 cm/m   RA Area:     14.60  cm LA Vol (A2C):   42.8 ml 23.98 ml/m  RA Volume:   34.40 ml  19.27 ml/m LA Vol (A4C):   39.7 ml 22.24 ml/m LA Biplane Vol: 44.6 ml 24.99 ml/m  AORTIC VALVE AV Area (Vmax):    2.06 cm AV Area (Vmean):   1.98 cm AV Area (VTI):     2.57 cm AV Vmax:           189.00 cm/s AV Vmean:          133.000 cm/s AV VTI:            0.298 m AV Peak Grad:      14.3 mmHg AV Mean Grad:      8.0 mmHg LVOT Vmax:         124.00 cm/s LVOT Vmean:        83.800 cm/s LVOT VTI:          0.244 m LVOT/AV VTI ratio: 0.82  AORTA Ao Root diam: 3.10 cm MITRAL VALVE               TRICUSPID VALVE MV Area (PHT): 5.75 cm    TR Peak grad:   36.5 mmHg MV Decel Time: 132 msec    TR Vmax:        302.00 cm/s MV E velocity: 98.50 cm/s MV A velocity: 98.00 cm/s  SHUNTS MV E/A ratio:  1.01        Systemic VTI:  0.24 m  Systemic Diam: 2.00 cm Nelva Bush MD Electronically signed by Nelva Bush MD Signature Date/Time: 07/13/2022/4:23:33 PM    Final    DG Chest Port 1 View  Result Date: 07/13/2022 CLINICAL DATA:  Thoracentesis EXAM: PORTABLE CHEST 1 VIEW COMPARISON:  07/12/2022 FINDINGS: Stable cardiomediastinal contours. Moderate-sized bilateral pleural effusions. Right-sided pleural effusion has decreased in volume following thoracentesis. Hazy bibasilar airspace opacities. Stable partially calcified nodule in the right upper lobe. Pulmonary vascular congestion. No pneumothorax. IMPRESSION: 1. Decreased volume of right pleural effusion following thoracentesis. No pneumothorax. 2. Persistent moderate bilateral pleural effusions with edema. Electronically Signed   By: Davina Poke D.O.   On: 07/13/2022 11:50   US THORACENTESIS ASP PLEURAL SPACE W/IMG GUIDE  Result Date: 07/13/2022 INDICATION: Patient with history of gastric lymphoma with recurrent pleural effusions. Request is for therapeutic and diagnostic thoracentesis EXAM: ULTRASOUND GUIDED THERAPEUTIC AND DIAGNOSTIC RIGHT-SIDED THORACENTESIS MEDICATIONS:  Lidocaine 1% 10 mL COMPLICATIONS: None immediate. PROCEDURE: An ultrasound guided thoracentesis was thoroughly discussed with the patient and questions answered. The benefits, risks, alternatives and complications were also discussed. The patient understands and wishes to proceed with the procedure. Written consent was obtained. Ultrasound was performed to localize and mark an adequate pocket of fluid in the right chest. The area was then prepped and draped in the normal sterile fashion. 1% Lidocaine was used for local anesthesia. Under ultrasound guidance a 6 Fr Safe-T-Centesis catheter was introduced. Thoracentesis was performed. The catheter was removed and a dressing applied. FINDINGS: A total of approximately 850 mL of amber colored fluid was removed. Samples were sent to the laboratory as requested by the clinical team. IMPRESSION: Successful ultrasound guided therapeutic and diagnostic right-sided thoracentesis yielding 850 mL of pleural fluid. Read by: Rushie Nyhan, NP Electronically Signed   By: Albin Felling M.D.   On: 07/13/2022 11:26   CT Angio Chest PE W and/or Wo Contrast  Result Date: 07/12/2022 CLINICAL DATA:  Severe shortness of breath, weakness, fell yesterday, history of lymphoma EXAM: CT ANGIOGRAPHY CHEST WITH CONTRAST TECHNIQUE: Multidetector CT imaging of the chest was performed using the standard protocol during bolus administration of intravenous contrast. Multiplanar CT image reconstructions and MIPs were obtained to evaluate the vascular anatomy. RADIATION DOSE REDUCTION: This exam was performed according to the departmental dose-optimization program which includes automated exposure control, adjustment of the mA and/or kV according to patient size and/or use of iterative reconstruction technique. CONTRAST:  73m OMNIPAQUE IOHEXOL 350 MG/ML SOLN COMPARISON:  06/16/2022, 04/16/2022 FINDINGS: Cardiovascular: This is a technically adequate evaluation of the pulmonary vasculature. No  filling defects or pulmonary emboli. The heart is unremarkable without pericardial effusion. No evidence of thoracic aortic aneurysm or dissection. Mediastinum/Nodes: The extensive mediastinal, hilar, and axillary adenopathy seen on prior exam again identified, not appreciably changed. Index lymph nodes are as follows: Right axilla, image 24/4, 3.1 x 2.0 cm. Precarinal, image 42/4, 1.7 x 2.1 cm. Stable appearance of the thyroid, trachea, and esophagus. Lungs/Pleura: There are persistent bilateral pleural effusions, right greater than left, increased since prior study. Dense consolidation within the lung bases, greatest in the lower lobes, most compatible with compressive atelectasis. Partially calcified right upper lobe nodule again identified reference image 51/4 measuring up to 2 cm. No pneumothorax. Central airways are patent. Upper Abdomen: Partial visualization of marked splenomegaly and extensive upper abdominal lymphadenopathy. Musculoskeletal: There is diffuse body wall edema. No acute or destructive bony lesions. Reconstructed images demonstrate no additional findings. Review of the MIP images confirms the above findings. IMPRESSION: 1.  No evidence of pulmonary embolus. 2. Worsening large bilateral pleural effusions with underlying lower lobe atelectasis. 3. Diffuse anasarca. 4. Extensive thoracic and abdominal lymphadenopathy, as well as marked splenomegaly, compatible with patient's known history of lymphoma. Electronically Signed   By: Randa Ngo M.D.   On: 07/12/2022 17:05   CT Head Wo Contrast  Result Date: 07/12/2022 CLINICAL DATA:  Fall.  History of lymphoma. EXAM: CT HEAD WITHOUT CONTRAST TECHNIQUE: Contiguous axial images were obtained from the base of the skull through the vertex without intravenous contrast. RADIATION DOSE REDUCTION: This exam was performed according to the departmental dose-optimization program which includes automated exposure control, adjustment of the mA and/or kV  according to patient size and/or use of iterative reconstruction technique. COMPARISON:  03/19/2020 head CT, 09/29/2018 PET-CT FINDINGS: Brain: No evidence of acute infarction, hemorrhage, hydrocephalus, extra-axial collection or mass lesion/mass effect. Patchy low-density changes within the periventricular and subcortical white matter compatible with chronic microvascular ischemic change. Mild diffuse cerebral volume loss. Vascular: No hyperdense vessel or unexpected calcification. Skull: Normal. Negative for fracture or focal lesion. Sinuses/Orbits: Mucosal thickening of the right maxillary and right frontal sinuses. Partial bilateral mastoid effusions. Other: Marked enlargement of the bilateral parotid glands is new from 2019. IMPRESSION: 1. No acute intracranial findings. 2. Marked enlargement of the bilateral parotid glands, new from 2019. 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 3. Chronic microvascular ischemic change and cerebral volume loss. 4. Right maxillary and right frontal sinus disease. Partial bilateral mastoid effusions. Electronically Signed   By: Davina Poke D.O.   On: 07/12/2022 15:36   DG Chest Portable 1 View  Result Date: 07/12/2022 CLINICAL DATA:  Shortness of breath, hypoxia EXAM: PORTABLE CHEST 1 VIEW COMPARISON:  Previous studies including the examination of 06/17/2022 FINDINGS: Transverse diameter of heart is increased. Central pulmonary vessels are more prominent. Moderate to large bilateral pleural effusions are seen, more so on the right side with significant interval increase. There is no pneumothorax. Evaluation of mid and lower lung fields for focal infiltrates is limited by the effusions. IMPRESSION: Central pulmonary vessels are more prominent suggesting CHF. Moderate to large bilateral pleural effusions, more so on the right side with significant interval increase. Electronically Signed   By:  Elmer Picker M.D.   On: 07/12/2022 15:05    Microbiology: Recent Results (from the past 240 hour(s))  SARS Coronavirus 2 by RT PCR (hospital order, performed in [facility] hospital lab) *cepheid single result test* Anterior Nasal Swab     Status: Abnormal   Collection Time: 08/02/22  9:06 PM   Specimen: Anterior Nasal Swab  Result Value Ref Range Status   SARS Coronavirus 2 by RT PCR POSITIVE (A) NEGATIVE Final    Comment: (NOTE) SARS-CoV-2 target nucleic acids are DETECTED  SARS-CoV-2 RNA is generally detectable in upper respiratory specimens  during the acute phase of infection.  Positive results are indicative  of the presence of the identified virus, but do not rule out bacterial infection or co-infection with other pathogens not detected by the test.  Clinical correlation with patient history and  other diagnostic information is necessary to determine patient infection status.  The expected result is negative.  Fact Sheet for Patients:   https://www.patel.info/   Fact Sheet for Healthcare Providers:   https://hall.com/    This test is not yet approved or cleared by the [place] FDA and  has been authorized for detection and/or  diagnosis of SARS-CoV-2 by FDA under an Emergency Use Authorization (EUA).  This EUA will remain in effect (meaning this test can be used) for the duration of  the COVID-19 declaration under Section 564(b)(1)  of the Act, 21 U.S.C. section 360-bbb-3(b)(1), unless the authorization is terminated or revoked sooner.   Performed at [facility], [address] [place], [place] [address]      Labs: Basic Metabolic Panel: Recent Labs  Lab 08/02/22 1623 08/04/22 0542  NA 137 139  K 4.2 3.6  CL 104 101  CO2 27 30  GLUCOSE 156* 149*  BUN 38* 44*  CREATININE 0.96 1.47*  CALCIUM 9.4 8.6*  MG  --  1.9   Liver Function Tests: Recent Labs  Lab 08/02/22 2106 08/04/22 0542  AST  21 22  ALT 12 10  ALKPHOS 126 117  BILITOT 1.7* 1.7*  PROT 5.4* 4.7*  ALBUMIN 3.0* 2.7*   No results for input(s): "LIPASE", "AMYLASE" in the last 168 hours. No results for input(s): "AMMONIA" in the last 168 hours. CBC: Recent Labs  Lab 08/02/22 1623 08/03/22 0954 08/03/22 1956 08/04/22 0542  WBC 5.0  --   --  5.3  HGB 6.4* 7.2* 7.6* 7.8*  HCT 21.6* 23.5* 25.2* 24.9*  MCV 95.2  --   --  90.9  PLT 98*  --   --  103*   Cardiac Enzymes: No results for input(s): "CKTOTAL", "CKMB", "CKMBINDEX", "TROPONINI" in the last 168 hours. BNP: BNP (last 3 results) Recent Labs    06/16/22 2144 07/12/22 1932 08/02/22 2106  BNP 170.5* 320.6* 171.4*    ProBNP (last 3 results) No results for input(s): "PROBNP" in the last 8760 hours.  CBG: Recent Labs  Lab 08/03/22 1958 08/04/22 0030 08/04/22 0534 08/04/22 0842 08/04/22 1241  GLUCAP 122* 99 165* 81 132*       Signed:  Desma Maxim MD.  [facility] 08/04/2022, 2:37 PM

## 2022-08-05 ENCOUNTER — Telehealth: Payer: Self-pay | Admitting: *Deleted

## 2022-08-05 ENCOUNTER — Ambulatory Visit: Payer: Medicare Other | Admitting: Nurse Practitioner

## 2022-08-05 ENCOUNTER — Other Ambulatory Visit: Payer: Self-pay

## 2022-08-05 ENCOUNTER — Ambulatory Visit: Payer: Medicare Other

## 2022-08-05 DIAGNOSIS — I509 Heart failure, unspecified: Secondary | ICD-10-CM

## 2022-08-05 DIAGNOSIS — E1129 Type 2 diabetes mellitus with other diabetic kidney complication: Secondary | ICD-10-CM

## 2022-08-05 DIAGNOSIS — U071 COVID-19: Secondary | ICD-10-CM

## 2022-08-05 NOTE — Chronic Care Management (AMB) (Signed)
  Care Coordination  Outreach Note  08/05/2022 Name: Natasha [name] MRN: [id number] DOB: 12-30-1945   Care Coordination Outreach Attempts: An unsuccessful telephone outreach was attempted today to offer the patient information about available care coordination services as a benefit of their health plan.   Follow Up Plan:  Additional outreach attempts will be made to offer the patient care coordination information and services.   Encounter Outcome:  No Answer  [place]  Direct Dial: [phone]

## 2022-08-05 NOTE — Consult Note (Signed)
   [facility] Inpatient Consult   08/05/2022  Natasha [name] 1946/06/15 [id number]  [place] Organization [ACO] Patient: [employer] Medicare  Primary Care Provider:  [name], NP, [facility]   Patient screened for hospitalization with noted high risk score for unplanned readmission risk and to assess for potential [place] Management service needs for post hospital transition.  Review of patient's medical record reveals patient is for home with [place]:  Patient had post hospital TOC follow up from previous admission will likely have ongoing TOC follow up.  Request referral for care coordination to ensure [facility] starts.  For questions contact:   Natividad Brood, RN BSN [facility] Liaison  [phone] business mobile phone Toll free office [phone]  Fax number: [phone] [name]

## 2022-08-06 ENCOUNTER — Telehealth: Payer: Self-pay | Admitting: *Deleted

## 2022-08-06 ENCOUNTER — Inpatient Hospital Stay: Payer: Medicare Other

## 2022-08-06 ENCOUNTER — Other Ambulatory Visit: Payer: Self-pay

## 2022-08-06 DIAGNOSIS — Z95828 Presence of other vascular implants and grafts: Secondary | ICD-10-CM

## 2022-08-06 NOTE — Chronic Care Management (AMB) (Signed)
  Care Coordination   Note   08/06/2022 Name: Natasha [name] MRN: [id number] DOB: 18-Jan-1946  Natasha [name] is a 76 y.o. year old female who sees [name], NP for primary care. I reached out to [place] by phone today to offer care coordination services.  [name] was given information about Care Coordination services today including:   The Care Coordination services include support from the care team which includes your Nurse Coordinator, Clinical Social Worker, or Pharmacist.  The Care Coordination team is here to help remove barriers to the health concerns and goals most important to you. Care Coordination services are voluntary, and the patient may decline or stop services at any time by request to their care team member.   Care Coordination Consent Status: Patient care giver [name],Natasha agreed to services and verbal consent obtained.   Follow up plan:  Telephone appointment with care coordination team member scheduled for:  08/10/22  Encounter Outcome:  Pt. Scheduled  [place]  Direct Dial: [phone]

## 2022-08-06 NOTE — Patient Outreach (Signed)
  Care Coordination   Initial Visit Note   08/06/2022 Name: Natasha [name] MRN: [id number] DOB: April 17, 1946  [name] is a 76 y.o. year old female who sees [name], NP for primary care. I spoke with  [name] caregiver by phone today.He is in a cancer conference.    Encounter Outcome:  Pt. Request to Call [place] [facility] [phone]

## 2022-08-07 DIAGNOSIS — B0222 Postherpetic trigeminal neuralgia: Secondary | ICD-10-CM | POA: Diagnosis not present

## 2022-08-07 DIAGNOSIS — Z9181 History of falling: Secondary | ICD-10-CM | POA: Diagnosis not present

## 2022-08-07 DIAGNOSIS — Z87891 Personal history of nicotine dependence: Secondary | ICD-10-CM | POA: Diagnosis not present

## 2022-08-07 DIAGNOSIS — M170 Bilateral primary osteoarthritis of knee: Secondary | ICD-10-CM | POA: Diagnosis not present

## 2022-08-07 DIAGNOSIS — D630 Anemia in neoplastic disease: Secondary | ICD-10-CM | POA: Diagnosis not present

## 2022-08-07 DIAGNOSIS — J449 Chronic obstructive pulmonary disease, unspecified: Secondary | ICD-10-CM | POA: Diagnosis not present

## 2022-08-07 DIAGNOSIS — Z9981 Dependence on supplemental oxygen: Secondary | ICD-10-CM | POA: Diagnosis not present

## 2022-08-07 DIAGNOSIS — E1122 Type 2 diabetes mellitus with diabetic chronic kidney disease: Secondary | ICD-10-CM | POA: Diagnosis not present

## 2022-08-07 DIAGNOSIS — Z8616 Personal history of COVID-19: Secondary | ICD-10-CM | POA: Diagnosis not present

## 2022-08-07 DIAGNOSIS — Z794 Long term (current) use of insulin: Secondary | ICD-10-CM | POA: Diagnosis not present

## 2022-08-07 DIAGNOSIS — N1831 Chronic kidney disease, stage 3a: Secondary | ICD-10-CM | POA: Diagnosis not present

## 2022-08-07 DIAGNOSIS — I5033 Acute on chronic diastolic (congestive) heart failure: Secondary | ICD-10-CM | POA: Diagnosis not present

## 2022-08-07 DIAGNOSIS — J9621 Acute and chronic respiratory failure with hypoxia: Secondary | ICD-10-CM | POA: Diagnosis not present

## 2022-08-07 DIAGNOSIS — Z7952 Long term (current) use of systemic steroids: Secondary | ICD-10-CM | POA: Diagnosis not present

## 2022-08-07 DIAGNOSIS — Z79891 Long term (current) use of opiate analgesic: Secondary | ICD-10-CM | POA: Diagnosis not present

## 2022-08-07 DIAGNOSIS — C8599 Non-Hodgkin lymphoma, unspecified, extranodal and solid organ sites: Secondary | ICD-10-CM | POA: Diagnosis not present

## 2022-08-07 DIAGNOSIS — D631 Anemia in chronic kidney disease: Secondary | ICD-10-CM | POA: Diagnosis not present

## 2022-08-07 DIAGNOSIS — J90 Pleural effusion, not elsewhere classified: Secondary | ICD-10-CM | POA: Diagnosis not present

## 2022-08-07 DIAGNOSIS — E039 Hypothyroidism, unspecified: Secondary | ICD-10-CM | POA: Diagnosis not present

## 2022-08-07 DIAGNOSIS — D696 Thrombocytopenia, unspecified: Secondary | ICD-10-CM | POA: Diagnosis not present

## 2022-08-07 NOTE — Progress Notes (Deleted)
   Patient ID: Natasha [name], female    DOB: 05-30-1946, 76 y.o.   MRN: [id number]  HPI  Natasha [name] is a 76 y/o female with a history of  Echo report from 07/13/22 reviewed and showed an EF of 60-65% along with mildly elevated PA pressure, mild MR and mild/moderate TR.   Admitted 08/02/22 due to worsening shortness of breath. Recently treated for covid with paxlovid. Thoracentesis done with removal of 1L. Initially had increased oxygen requirement but then weaned back to baseline of 3L. Given 1 unit PRBC's. Lasix held due to AKI. Discharged after 2 days.   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

## 2022-08-08 ENCOUNTER — Encounter: Payer: Self-pay | Admitting: Family

## 2022-08-08 DIAGNOSIS — R609 Edema, unspecified: Secondary | ICD-10-CM | POA: Diagnosis not present

## 2022-08-08 DIAGNOSIS — I5030 Unspecified diastolic (congestive) heart failure: Secondary | ICD-10-CM | POA: Diagnosis not present

## 2022-08-08 DIAGNOSIS — R519 Headache, unspecified: Secondary | ICD-10-CM | POA: Diagnosis not present

## 2022-08-09 ENCOUNTER — Ambulatory Visit: Payer: Medicare Other

## 2022-08-09 ENCOUNTER — Ambulatory Visit: Payer: Medicare Other | Admitting: Family

## 2022-08-09 ENCOUNTER — Telehealth: Payer: Self-pay | Admitting: *Deleted

## 2022-08-09 ENCOUNTER — Ambulatory Visit: Admission: RE | Admit: 2022-08-09 | Payer: Medicare Other | Source: Ambulatory Visit

## 2022-08-09 DIAGNOSIS — I5030 Unspecified diastolic (congestive) heart failure: Secondary | ICD-10-CM | POA: Diagnosis not present

## 2022-08-09 NOTE — Patient Outreach (Signed)
  Care Coordination [facility] Note Transition Care Management Unsuccessful Follow-up Telephone Call  Date of discharge and from where:  [facility] [phone]   Attempts:  2nd Attempt  Reason for unsuccessful TCM follow-up call:  Left voice message  [place] [facility] [phone]

## 2022-08-10 ENCOUNTER — Telehealth: Payer: Self-pay | Admitting: *Deleted

## 2022-08-10 ENCOUNTER — Ambulatory Visit: Payer: Self-pay

## 2022-08-10 DIAGNOSIS — Z139 Encounter for screening, unspecified: Secondary | ICD-10-CM

## 2022-08-10 MED FILL — Dexamethasone Sodium Phosphate Inj 100 MG/10ML: INTRAMUSCULAR | Qty: 1 | Status: AC

## 2022-08-10 NOTE — Patient Outreach (Signed)
  Care Coordination [facility] Note Transition Care Management Unsuccessful Follow-up Telephone Call  Date of discharge and from where:  [facility] [phone]  Attempts:  3rd Attempt  Reason for unsuccessful TCM follow-up call:  Left voice message  [place] [phone]

## 2022-08-11 ENCOUNTER — Inpatient Hospital Stay: Payer: Medicare Other

## 2022-08-11 ENCOUNTER — Other Ambulatory Visit: Payer: Self-pay | Admitting: *Deleted

## 2022-08-11 ENCOUNTER — Ambulatory Visit: Payer: Medicare Other | Admitting: Oncology

## 2022-08-11 ENCOUNTER — Telehealth: Payer: Self-pay | Admitting: *Deleted

## 2022-08-11 ENCOUNTER — Other Ambulatory Visit: Payer: Self-pay | Admitting: Oncology

## 2022-08-11 ENCOUNTER — Other Ambulatory Visit: Payer: Self-pay

## 2022-08-11 ENCOUNTER — Inpatient Hospital Stay: Payer: Medicare Other | Admitting: Oncology

## 2022-08-11 DIAGNOSIS — C8580 Other specified types of non-Hodgkin lymphoma, unspecified site: Secondary | ICD-10-CM

## 2022-08-11 DIAGNOSIS — I5030 Unspecified diastolic (congestive) heart failure: Secondary | ICD-10-CM | POA: Diagnosis not present

## 2022-08-11 DIAGNOSIS — E1165 Type 2 diabetes mellitus with hyperglycemia: Secondary | ICD-10-CM | POA: Diagnosis not present

## 2022-08-11 NOTE — Telephone Encounter (Signed)
[name] did give me the number to robin MD is [phone]

## 2022-08-11 NOTE — Patient Outreach (Addendum)
  Care Coordination   Follow Up Visit Note   08/11/2022 Late entry for 08/10/22 Name: Natasha [name] MRN: [id number] DOB: 1946/07/14  Natasha [name] is a 76 y.o. year old female who sees [name], NP for primary care. I  spoke with [name]  Nieswonder, friend/ designated party release  What matters to the patients health and wellness today?  [name] states he and friend completed patients chemotherapy class as discussed with oncology office.  He states office will not reschedule patients PET scan or put in PORT until class has been completed by patient. [name] states he is receiving some assistance from Dr. On Demand with navigating patients health situation.  [name] voiced concern that patient is giving her financial resources such as [employer], SSI monies to her daughter and not having  financial resources to by enough food to last through the month.  He states he does not feel the  patient understands the repercussions for turning over her financial resources.  He states he would like to talk with a social work regarding [employer] needs for patient.  [name] states patient has frequent falls. He states patient is able to manage her ADL's with minor assistance. He reports patient has transportation assistance through her insurance plan and Medicaid for provider appointments. He states he is not sure her transportation resources will take her to the food bank if / when needed therefore request additional transportation assistance.    Goals Addressed             This Visit's Progress    Patient / caregiver stated:  Management of health conditions       Care Coordination Interventions: Evaluation of current treatment plan related to Lymphoma and patient's adherence to plan as established by provider Reviewed medications with patient's caregiver and discussed *importance of compliance Reviewed scheduled/upcoming provider appointments Discussed plans with patient for ongoing care management  follow up and provided patient with direct contact information for care management team Referred patient to community resource guide for food insecurity Referred to social worker for [employer] needs.  Discussed caregiver determining if patient will complete chemotherapy class. Discussed fall precautions with caregiver for patient           SDOH assessments and interventions completed:  Yes  SDOH Interventions Today    Flowsheet Row Most Recent Value  SDOH Interventions   Food Insecurity Interventions Other (Comment)  [referred to [employer] guide for food security assistance.]  Housing Interventions Intervention Not Indicated  Transportation Interventions Intervention Not Indicated        Care Coordination Interventions Activated:  Yes  Care Coordination Interventions:  Yes, provided   Follow up plan: Follow up call scheduled for 08/26/22 at 2 pm    Encounter Outcome:  Pt. Visit Completed

## 2022-08-11 NOTE — Telephone Encounter (Signed)
Pt had an appt for chemo today and then I called [name] to see what was going on. He said that he did not know about. We have been using my chart most of the time so I was shocked to know he did not know about the appt. After that we were going to set Edson for next week being on Thursday. [name] says it can't be that day because she already had an appt and it was already r/s once before. Then I spoke to to [place] and said can pt come on next tues and wed and make sure for transportation time also. He said yes to begin and then I told him that we will get IV in vein for first chemo and then the next tx. We will have port in. [name] said that the pt was not doing anything with out a port. Then I called vascular because we had sent ref to vascular for port. I spoke to Mickel Baas with vascular and the vascular lab to do this is not got set up from the renovation so no ports this coming week. So I told Mickel Baas that I will check with IR and I spoke to [facility] and she said they can do on Monday. At first the appt would be arrival 12:30 for a 1 :30. Then [name] said that they have an appt Monday with cardiac and the appt was 12:00. He was worried about if they are late so then we changed it to 1:pm for 2 pm. He was ok . I told him she will need NPO for 8 hours prior and she will get sedation but not sure if she will totally asleep and then he had to go talk to her because she did  not want to be awake and then she was agreeable for it. Then [name] states that will not tell him when she gets nauseated  and he rec: that we can give pt IM inj. To give to her and als he said that she complains when she gets lots of pills. I did call Janese Banks about it and she said no. Then he told me that he sent the rx to be done from shipping it to them so some of her meds for nausea it could take til wed to get all the of them. Dr. Janese Banks said that she will be at cancer center tues and wed. So if she gets sick we can help there. He said I hope this is going to work.  Also we are not suppose to say chemotherapy or she will not due the treatment. He says that she will get tx of bendamustine and Rituxan. I told him we will do our best but the pt has to know that we are treating her for lymphoma. Then later he tells me that his sugar is always a problem and he is concerned with sugar levels and diet for her. He said 1 person say watch out due to kidney, then diabetic diet for diabetes, then at the info for chemo he was told that he should be on  chemo diet. I told him that we do have issues with diabetes and dexamethasone. First if the sugar is too high then she will not get the tx. Then if it is not to bad then we may decrease the amount of dexamethasone and then other times we will reach out to PCP and see if we can have extra insulin on day of and day after at times. At this time  we do not know how it will work out until the days are here. He then says that a home health person can come over if needed if she has issues and also he was going to make an appt with robin-MD about the sugar issues.I told him I will tell Dr Darrall Dears she will be seeing her due to Janese Banks is on vacation.

## 2022-08-12 ENCOUNTER — Telehealth: Payer: Self-pay | Admitting: *Deleted

## 2022-08-12 ENCOUNTER — Inpatient Hospital Stay: Payer: Medicare Other

## 2022-08-12 ENCOUNTER — Ambulatory Visit: Payer: Medicare Other

## 2022-08-12 DIAGNOSIS — R0602 Shortness of breath: Secondary | ICD-10-CM | POA: Diagnosis not present

## 2022-08-12 DIAGNOSIS — R109 Unspecified abdominal pain: Secondary | ICD-10-CM | POA: Diagnosis not present

## 2022-08-12 NOTE — Telephone Encounter (Signed)
   Telephone encounter was:  Successful.  08/12/2022 Name: Natasha [name] MRN: [id number] DOB: 15-Jun-1946  Natasha [name] is a 76 y.o. year old female who is a primary care patient of [name], NP . The community resource team was consulted for assistance with Transportation Needs , Food Insecurity, Home Modifications, and Caregiver Stress  Care guide performed the following interventions: Patient provided with information about care guide support team and interviewed to confirm resource needs. Provided information to caregiver about food, POA , medicaid and created  360 referal also I emailed him documentation .Reached out to [facility] case manager to connect for upcoming apt    Follow Up Plan:  No further follow up planned at this time. The patient has been provided with needed resources. [place] [phone] [address] , [place] [address] Email : [email or web address]

## 2022-08-13 ENCOUNTER — Other Ambulatory Visit (HOSPITAL_COMMUNITY): Payer: Self-pay | Admitting: Student

## 2022-08-13 NOTE — Progress Notes (Signed)
Patient for IR Port Placement on Mon 08/16/22, [name] RN in [place] spoke with patient's spouse, [name] on phone and gave pre-procedure instructions. Pt was made aware to be here at 13:00, NPO after MN prior to procedure as well as driver post procedure/recovery/discharge. Pt's spouse, [name] stated understanding. Please read Ledell Peoples previous note on 08/11/22 if any questions.

## 2022-08-16 ENCOUNTER — Telehealth: Payer: Self-pay

## 2022-08-16 ENCOUNTER — Observation Stay
Admission: RE | Admit: 2022-08-16 | Discharge: 2022-08-17 | Disposition: A | Discharge status: Home / Self Care | Payer: Medicare Other | Source: Ambulatory Visit | Attending: Internal Medicine | Admitting: Internal Medicine

## 2022-08-16 ENCOUNTER — Ambulatory Visit
Admission: RE | Admit: 2022-08-16 | Discharge: 2022-08-16 | Disposition: A | Discharge status: Home / Self Care | Payer: Medicare Other | Source: Ambulatory Visit | Attending: Student | Admitting: Student

## 2022-08-16 ENCOUNTER — Encounter: Payer: Self-pay | Admitting: Family

## 2022-08-16 ENCOUNTER — Ambulatory Visit (HOSPITAL_BASED_OUTPATIENT_CLINIC_OR_DEPARTMENT_OTHER): Payer: Medicare Other | Admitting: Family

## 2022-08-16 ENCOUNTER — Encounter: Payer: Self-pay | Admitting: Radiology

## 2022-08-16 ENCOUNTER — Other Ambulatory Visit: Payer: Self-pay

## 2022-08-16 ENCOUNTER — Encounter: Payer: Self-pay | Admitting: *Deleted

## 2022-08-16 VITALS — BP 99/62 | HR 91 | Temp 98.1°F | Resp 12 | Ht 65.0 in | Wt 140.0 lb

## 2022-08-16 VITALS — BP 102/50 | HR 84 | Resp 16 | Ht 64.0 in | Wt 157.2 lb

## 2022-08-16 DIAGNOSIS — C8590 Non-Hodgkin lymphoma, unspecified, unspecified site: Secondary | ICD-10-CM

## 2022-08-16 DIAGNOSIS — E039 Hypothyroidism, unspecified: Secondary | ICD-10-CM | POA: Diagnosis not present

## 2022-08-16 DIAGNOSIS — R809 Proteinuria, unspecified: Secondary | ICD-10-CM | POA: Diagnosis not present

## 2022-08-16 DIAGNOSIS — I959 Hypotension, unspecified: Secondary | ICD-10-CM | POA: Diagnosis not present

## 2022-08-16 DIAGNOSIS — Z794 Long term (current) use of insulin: Secondary | ICD-10-CM | POA: Insufficient documentation

## 2022-08-16 DIAGNOSIS — R739 Hyperglycemia, unspecified: Secondary | ICD-10-CM

## 2022-08-16 DIAGNOSIS — M6281 Muscle weakness (generalized): Secondary | ICD-10-CM | POA: Diagnosis not present

## 2022-08-16 DIAGNOSIS — I509 Heart failure, unspecified: Secondary | ICD-10-CM | POA: Diagnosis not present

## 2022-08-16 DIAGNOSIS — D649 Anemia, unspecified: Secondary | ICD-10-CM | POA: Insufficient documentation

## 2022-08-16 DIAGNOSIS — E1129 Type 2 diabetes mellitus with other diabetic kidney complication: Secondary | ICD-10-CM

## 2022-08-16 DIAGNOSIS — J441 Chronic obstructive pulmonary disease with (acute) exacerbation: Secondary | ICD-10-CM | POA: Diagnosis not present

## 2022-08-16 DIAGNOSIS — C8580 Other specified types of non-Hodgkin lymphoma, unspecified site: Secondary | ICD-10-CM

## 2022-08-16 DIAGNOSIS — R2689 Other abnormalities of gait and mobility: Secondary | ICD-10-CM | POA: Diagnosis not present

## 2022-08-16 DIAGNOSIS — I5032 Chronic diastolic (congestive) heart failure: Secondary | ICD-10-CM

## 2022-08-16 DIAGNOSIS — J9621 Acute and chronic respiratory failure with hypoxia: Secondary | ICD-10-CM | POA: Diagnosis not present

## 2022-08-16 DIAGNOSIS — E1122 Type 2 diabetes mellitus with diabetic chronic kidney disease: Secondary | ICD-10-CM

## 2022-08-16 DIAGNOSIS — J90 Pleural effusion, not elsewhere classified: Secondary | ICD-10-CM | POA: Diagnosis not present

## 2022-08-16 DIAGNOSIS — I5033 Acute on chronic diastolic (congestive) heart failure: Secondary | ICD-10-CM | POA: Insufficient documentation

## 2022-08-16 DIAGNOSIS — Z9981 Dependence on supplemental oxygen: Secondary | ICD-10-CM | POA: Diagnosis not present

## 2022-08-16 DIAGNOSIS — E119 Type 2 diabetes mellitus without complications: Secondary | ICD-10-CM | POA: Insufficient documentation

## 2022-08-16 DIAGNOSIS — J9611 Chronic respiratory failure with hypoxia: Secondary | ICD-10-CM | POA: Insufficient documentation

## 2022-08-16 DIAGNOSIS — Z452 Encounter for adjustment and management of vascular access device: Secondary | ICD-10-CM | POA: Diagnosis not present

## 2022-08-16 DIAGNOSIS — J449 Chronic obstructive pulmonary disease, unspecified: Secondary | ICD-10-CM

## 2022-08-16 DIAGNOSIS — F17210 Nicotine dependence, cigarettes, uncomplicated: Secondary | ICD-10-CM | POA: Insufficient documentation

## 2022-08-16 DIAGNOSIS — Z79890 Hormone replacement therapy (postmenopausal): Secondary | ICD-10-CM | POA: Insufficient documentation

## 2022-08-16 DIAGNOSIS — C884 Extranodal marginal zone B-cell lymphoma of mucosa-associated lymphoid tissue [MALT-lymphoma]: Secondary | ICD-10-CM | POA: Diagnosis not present

## 2022-08-16 HISTORY — PX: IR IMAGING GUIDED PORT INSERTION: IMG5740

## 2022-08-16 LAB — CBC
HCT: 24.6 % — ABNORMAL LOW (ref 36.0–46.0)
Hemoglobin: 7.2 g/dL — ABNORMAL LOW (ref 12.0–15.0)
MCH: 27.4 pg (ref 26.0–34.0)
MCHC: 29.3 g/dL — ABNORMAL LOW (ref 30.0–36.0)
MCV: 93.5 fL (ref 80.0–100.0)
Platelets: 108 10*3/uL — ABNORMAL LOW (ref 150–400)
RBC: 2.63 MIL/uL — ABNORMAL LOW (ref 3.87–5.11)
RDW: 18.2 % — ABNORMAL HIGH (ref 11.5–15.5)
WBC: 5.6 10*3/uL (ref 4.0–10.5)
nRBC: 1.4 % — ABNORMAL HIGH (ref 0.0–0.2)

## 2022-08-16 LAB — BLOOD GAS, ARTERIAL
Acid-Base Excess: 6.6 mmol/L — ABNORMAL HIGH (ref 0.0–2.0)
Bicarbonate: 31.9 mmol/L — ABNORMAL HIGH (ref 20.0–28.0)
FIO2: 0.36 %
O2 Saturation: 94.5 %
Patient temperature: 37
pCO2 arterial: 47 mmHg (ref 32–48)
pH, Arterial: 7.44 (ref 7.35–7.45)
pO2, Arterial: 68 mmHg — ABNORMAL LOW (ref 83–108)

## 2022-08-16 LAB — BASIC METABOLIC PANEL
Anion gap: 6 (ref 5–15)
BUN: 27 mg/dL — ABNORMAL HIGH (ref 8–23)
CO2: 29 mmol/L (ref 22–32)
Calcium: 9.3 mg/dL (ref 8.9–10.3)
Chloride: 103 mmol/L (ref 98–111)
Creatinine, Ser: 0.81 mg/dL (ref 0.44–1.00)
GFR, Estimated: >60 mL/min (ref >60)
Glucose, Bld: 132 mg/dL — ABNORMAL HIGH (ref 70–99)
Potassium: 4.2 mmol/L (ref 3.5–5.1)
Sodium: 138 mmol/L (ref 135–145)

## 2022-08-16 LAB — GLUCOSE, CAPILLARY
Glucose-Capillary: 78 mg/dL (ref 70–99)
Glucose-Capillary: 322 mg/dL — ABNORMAL HIGH (ref 70–99)

## 2022-08-16 MED ORDER — IPRATROPIUM-ALBUTEROL 0.5-2.5 (3) MG/3ML IN SOLN
RESPIRATORY_TRACT | Status: AC
  Administered 2022-08-16: 3 mL via RESPIRATORY_TRACT
  Filled 2022-08-16: qty 3

## 2022-08-16 MED ORDER — INSULIN ASPART 100 UNIT/ML IJ SOLN
0.0000 [IU] | Freq: Three times a day (TID) | INTRAMUSCULAR | Status: DC
  Administered 2022-08-17: 9 [IU] via SUBCUTANEOUS
  Filled 2022-08-16: qty 1

## 2022-08-16 MED ORDER — ALBUTEROL SULFATE (2.5 MG/3ML) 0.083% IN NEBU: 3.0000 mL | INHALATION_SOLUTION | RESPIRATORY_TRACT | Status: DC | PRN

## 2022-08-16 MED ORDER — FENTANYL CITRATE (PF) 100 MCG/2ML IJ SOLN
INTRAMUSCULAR | Status: DC | PRN
  Administered 2022-08-16 (×2): 25 ug via INTRAVENOUS

## 2022-08-16 MED ORDER — ENOXAPARIN SODIUM 40 MG/0.4ML IJ SOSY: 40.0000 mg | PREFILLED_SYRINGE | INTRAMUSCULAR | Status: DC

## 2022-08-16 MED ORDER — IPRATROPIUM-ALBUTEROL 0.5-2.5 (3) MG/3ML IN SOLN: 3.0000 mL | Freq: Four times a day (QID) | RESPIRATORY_TRACT | Status: DC

## 2022-08-16 MED ORDER — IPRATROPIUM-ALBUTEROL 0.5-2.5 (3) MG/3ML IN SOLN: 3.0000 mL | Freq: Once | RESPIRATORY_TRACT | Status: AC

## 2022-08-16 MED ORDER — ACETAMINOPHEN 325 MG PO TABS
650.0000 mg | ORAL_TABLET | Freq: Four times a day (QID) | ORAL | Status: DC | PRN
  Administered 2022-08-16: 650 mg via ORAL
  Filled 2022-08-16: qty 2

## 2022-08-16 MED ORDER — LIDOCAINE-EPINEPHRINE 1 %-1:100000 IJ SOLN
INTRAMUSCULAR | Status: AC
  Administered 2022-08-16: 15 mL
  Filled 2022-08-16: qty 1

## 2022-08-16 MED ORDER — ALBUTEROL SULFATE (2.5 MG/3ML) 0.083% IN NEBU: 2.5000 mg | INHALATION_SOLUTION | RESPIRATORY_TRACT | Status: DC | PRN

## 2022-08-16 MED ORDER — HEPARIN SOD (PORK) LOCK FLUSH 100 UNIT/ML IV SOLN
INTRAVENOUS | Status: AC
  Administered 2022-08-16: 500 [IU]
  Filled 2022-08-16: qty 5

## 2022-08-16 MED ORDER — METHYLPREDNISOLONE SODIUM SUCC 125 MG IJ SOLR
INTRAMUSCULAR | Status: AC
  Administered 2022-08-16: 60 mg via INTRAVENOUS
  Filled 2022-08-16: qty 2

## 2022-08-16 MED ORDER — METHYLPREDNISOLONE SODIUM SUCC 125 MG IJ SOLR: 60.0000 mg | Freq: Once | INTRAMUSCULAR | Status: AC

## 2022-08-16 MED ORDER — FUROSEMIDE 10 MG/ML IJ SOLN
INTRAMUSCULAR | Status: AC
  Administered 2022-08-16: 40 mg via INTRAVENOUS
  Filled 2022-08-16: qty 4

## 2022-08-16 MED ORDER — LEVOTHYROXINE SODIUM 50 MCG PO TABS
50.0000 ug | ORAL_TABLET | Freq: Every day | ORAL | Status: DC
  Administered 2022-08-17: 50 ug via ORAL
  Filled 2022-08-16: qty 1

## 2022-08-16 MED ORDER — FENTANYL CITRATE (PF) 100 MCG/2ML IJ SOLN
INTRAMUSCULAR | Status: AC
  Filled 2022-08-16: qty 2

## 2022-08-16 MED ORDER — MIDAZOLAM HCL 2 MG/2ML IJ SOLN
INTRAMUSCULAR | Status: AC
  Filled 2022-08-16: qty 2

## 2022-08-16 MED ORDER — MIDAZOLAM HCL 2 MG/2ML IJ SOLN
INTRAMUSCULAR | Status: DC | PRN
  Administered 2022-08-16 (×2): .5 mg via INTRAVENOUS

## 2022-08-16 MED ORDER — FUROSEMIDE 10 MG/ML IJ SOLN: 40.0000 mg | Freq: Once | INTRAMUSCULAR | Status: AC

## 2022-08-16 MED ORDER — SENNA 8.6 MG PO TABS
1.0000 | ORAL_TABLET | Freq: Two times a day (BID) | ORAL | Status: DC
  Administered 2022-08-16: 8.6 mg via ORAL
  Filled 2022-08-16: qty 1

## 2022-08-16 MED ORDER — INSULIN ASPART 100 UNIT/ML IJ SOLN
0.0000 [IU] | Freq: Every day | INTRAMUSCULAR | Status: DC
  Administered 2022-08-16: 4 [IU] via SUBCUTANEOUS
  Filled 2022-08-16 (×2): qty 1

## 2022-08-16 MED ORDER — SODIUM CHLORIDE 0.9 % IV SOLN: INTRAVENOUS | Status: DC

## 2022-08-16 MED ORDER — ONDANSETRON HCL 4 MG PO TABS: 4.0000 mg | ORAL_TABLET | Freq: Four times a day (QID) | ORAL | Status: DC | PRN

## 2022-08-16 MED ORDER — METHYLPREDNISOLONE SODIUM SUCC 125 MG IJ SOLR
60.0000 mg | INTRAMUSCULAR | Status: DC
  Administered 2022-08-17: 60 mg via INTRAVENOUS
  Filled 2022-08-16: qty 2

## 2022-08-16 MED ORDER — ACETAMINOPHEN 650 MG RE SUPP: 650.0000 mg | Freq: Four times a day (QID) | RECTAL | Status: DC | PRN

## 2022-08-16 MED ORDER — OXYCODONE HCL 5 MG PO TABS
5.0000 mg | ORAL_TABLET | Freq: Once | ORAL | Status: AC
  Administered 2022-08-16: 5 mg via ORAL
  Filled 2022-08-16: qty 1

## 2022-08-16 MED ORDER — ONDANSETRON HCL 4 MG/2ML IJ SOLN: 4.0000 mg | Freq: Four times a day (QID) | INTRAMUSCULAR | Status: DC | PRN

## 2022-08-16 MED FILL — Dexamethasone Sodium Phosphate Inj 100 MG/10ML: INTRAMUSCULAR | Qty: 1 | Status: AC

## 2022-08-16 NOTE — Patient Instructions (Addendum)
Continue weighing daily and call for an overnight weight gain of 3 pounds or more or a weekly weight gain of more than 5 pounds.   If you have voicemail, please make sure your mailbox is cleaned out so that we may leave a message and please make sure to listen to any voicemails.    Drink 60-64 ounces of fluid a day   Wrap legs in ACE wraps with removal at bedtime. Elevate legs when sitting for long periods of time

## 2022-08-16 NOTE — Patient Outreach (Signed)
  Care Coordination   Follow Up Visit Note   08/16/2022 Name: Samera Macy Jemmott MRN: [id number] DOB: October 11, 1946  Prentice Docker Lesniewski is a 76 y.o. year old female who sees [name], NP for primary care. I  spoke with [name].  What matters to the patients health and wellness today?   [name]  states patient is having PORT placed today.  He reports patient has appointments all well and is in agreement with rescheduling social worker appointment.     Goals Addressed             This Visit's Progress    Patient / caregiver stated:  Management of health conditions       Care Coordination Interventions: Returned call to patients caregiver, [name] to reschedule patients appointment with social worker           SDOH assessments and interventions completed:  No     Care Coordination Interventions Activated:  Yes  Care Coordination Interventions:  Yes, provided   Follow up plan: Follow up call scheduled for 08/26/22 at 2 pm    Encounter Outcome:  Pt. Visit Completed {THN Tip this will not be part of the note when signed-REQUIRED REPORT FIELD DO NOT DELETE (Optional):[id number] Quinn Plowman RN,BSN,CCM [place] [phone] direct line

## 2022-08-16 NOTE — Progress Notes (Signed)
Patient resting at present, towards end of port placement from lying flat began having difficulty with SOB, with declining sats, put to stretcher with head of bed elevated as patient states she rests at home with increasing oxygen sats back to 98%, prior to transferring back to her room 332. Upon arrival to recovery, began oxygen desaturation with inspiratory wheezes, declining oxygen sats,requiring nonrebreather. Contacted Dr Denna Haggard along with J. [place] PA who immediately came to assess patient. Upon their arrival patient began to recoop respiratory, stating more comfortable with her breathing. CXR ordered along with duoneb and done. Will cont to monitor closely. Report given to Genelle Bal RN along with Ena Dawley RN at bedside. Received Versed 1 mg along with Fentanyl 50 mcg IV for procedure.

## 2022-08-16 NOTE — Progress Notes (Signed)
       CROSS COVER NOTE  NAME: Natasha [name] MRN: [id number] DOB : [birth year] ATTENDING PHYSICIAN: Fritzi Mandes, MD    Date of Service   08/16/2022   HPI/Events of Note   Medication request received for patient report of 7/10 pain at port site refractory to tylenol.  Interventions   Assessment/Plan: Oxycodone x1    This document was prepared using Dragon voice recognition software and may include unintentional dictation errors.  Neomia Glass DNP, MBA, FNP-BC Nurse Practitioner [facility] Pager [phone]

## 2022-08-16 NOTE — H&P (Signed)
Chief Complaint: Patient was seen in consultation today for port-a-catheter placement  Referring Physician(s): Rao,Archana C  Supervising Physician: Juliet Rude  Patient Status: [facility] - Out-pt  History of Present Illness: Natasha [name] is a 76 y.o. female with a medical history significant for DM2, COPD on home oxygen, post-herpetic neuralgia, tobacco use, recurrent pleural effusion and recently diagnosed gastric lymphoma. She is familiar to IR from thoracenteses with the last one performed 08/03/22. Her oncology team is preparing her for chemotherapy.   Interventional Radiology has been asked to evaluate this patient for an image-guided port-a-catheter placement to facilitate her treatment plans.   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  Medications: Prior to Admission medications   Medication Sig Start Date End Date Taking? Authorizing Provider  albuterol (VENTOLIN HFA) 108 (90 Base) MCG/ACT inhaler TAKE 2 PUFFS BY MOUTH EVERY 6 HOURS AS NEEDED FOR WHEEZE OR SHORTNESS OF BREATH 06/17/22   SRalene MuskratB, MD  docusate sodium (COLACE) 100 MG capsule Take 100 mg by mouth daily as needed for mild constipation.    [provider]  insulin NPH-regular Human (70-30) 100 UNIT/ML injection Inject 5-10 Units into the skin 2 (two) times daily with a meal. Use only for hyperglycemia, CBG+ 300. Start with 5 units, titrate up to max 10 unit per dose if need. 07/17/22   Karamalegos, ADevonne Doughty DO  Insulin Syringe-Needle U-100 31G X 15/64" 0.3 ML MISC Use to inject insulin twice a day with meals. 07/17/22   Karamalegos, ADevonne Doughty DO   Ipratropium-Albuterol (COMBIVENT RESPIMAT) 20-100 MCG/ACT AERS respimat Inhale 1 puff into the lungs every 6 (six) hours.    [provider]  levothyroxine (SYNTHROID) 50 MCG tablet Take 1 tablet (50 mcg total) by mouth daily at 6 (six) AM. 07/17/22   ZSharen Hones MD  lidocaine-prilocaine (EMLA) cream Apply to affected area once Patient not taking: Reported on 08/10/2022 07/28/22   RSindy Guadeloupe MD  loperamide (IMODIUM) 2 MG capsule Take 2 mg by mouth as needed for diarrhea or loose stools.    [provider]  Naproxen Sod-diphenhydrAMINE 220-25 MG TABS Take by mouth. Patient not taking: Reported on 08/10/2022    [provider]  nicotine (NICODERM CQ - DOSED IN MG/24 HOURS) 21 mg/24hr patch Place 1 patch (21 mg total) onto the skin daily. Patient not taking: Reported on 08/10/2022 07/20/22   RSindy Guadeloupe MD  Simethicone 180 MG CAPS Take by mouth. Patient not taking: Reported on 08/10/2022    [provider]     Family History  Problem Relation Age of Onset   Breast cancer Mother    Alzheimer's disease Father    Lung cancer Father        lung   Varicose Veins Brother    Heart attack Brother     Social History   Socioeconomic History   Marital status: Widowed    Spouse name: Not on file   Number of children: 2   Years of education: Not on file  Highest education level: 9th grade  Occupational History   Occupation: unemployed  Tobacco Use   Smoking status: Former    Packs/day: 2.00    Years: 55.00    Total pack years: 110.00    Types: Cigarettes    Start date: 07/07/1961    Quit date: 08/02/2022    Years since quitting: 0.0   Smokeless tobacco: Never   Tobacco comments:    1/2 pack she states but family says 2 PPD  Vaping Use   Vaping Use: Never used  Substance and Sexual Activity   Alcohol use: No   Drug use: Never   Sexual activity: Not Currently    Partners: Male    Birth control/protection: Post-menopausal  Other Topics  Concern   Not on file  Social History Narrative   08/14/20   From: [place]   Living: to be near daughter   Work: retired      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      Enjoys: stays at home      Exercise: walking to her daughter's store   Diet: eats fruit, air fried chicken      Safety   Seat belts: Yes    Guns: No   Safe in relationships: Yes    Social Determinants of Health   Financial Resource Strain: High Risk (07/07/2018)   Overall Financial Resource Strain (CARDIA)    Difficulty of Paying Living Expenses: Very hard  Food Insecurity: Food Insecurity Present (08/11/2022)   Hunger Vital Sign    Worried About Running Out of Food in the Last Year: Sometimes true    Ran Out of Food in the Last Year: Sometimes true  Transportation Needs: No Transportation Needs (08/11/2022)   PRAPARE - Hydrologist (Medical): No    Lack of Transportation (Non-Medical): No  Physical Activity: Insufficiently Active (07/07/2018)   Exercise Vital Sign    Days of Exercise per Week: 7 days    Minutes of Exercise per Session: 10 min  Stress: No Stress Concern Present (07/07/2018)   [place]    Feeling of Stress : Not at all  Social Connections: Somewhat Isolated (07/07/2018)   Social Connection and Isolation Panel [NHANES]    Frequency of Communication with Friends and Family: Never    Frequency of Social Gatherings with Friends and Family: More than three times a week    Attends Religious Services: More than 4 times per year    Active Member of [employer] or Organizations: No    Attends Archivist Meetings: Never    Marital Status: Widowed    Review of Systems: A 12 point ROS discussed and pertinent positives are indicated in the HPI above.  All other systems are negative.  Review of Systems  Constitutional:  Negative for  appetite change and fatigue.  Respiratory:  Positive for shortness of breath. Negative for cough.   Cardiovascular:  Positive for leg swelling. Negative for chest pain.  Gastrointestinal:  Negative for abdominal pain, diarrhea, nausea and vomiting.  Neurological:  Positive for headaches. Negative for dizziness.    Vital Signs: BP (!) 99/54   Pulse 84   Temp 98.2 F (36.8 C) (Oral)   Resp 17   Ht '5\' 5"'$  (1.651 m)   Wt 140 lb (63.5 kg)   SpO2 93%   BMI 23.30 kg/m   Physical Exam Constitutional:  General: She is not in acute distress. HENT:     Mouth/Throat:     Mouth: Mucous membranes are moist.     Pharynx: Oropharynx is clear.  Cardiovascular:     Rate and Rhythm: Normal rate and regular rhythm.     Pulses: Normal pulses.  Pulmonary:     Effort: Pulmonary effort is normal.  Abdominal:     Palpations: Abdomen is soft.     Tenderness: There is no abdominal tenderness.  Musculoskeletal:     Right lower leg: Edema present.     Left lower leg: Edema present.  Skin:    General: Skin is warm and dry.  Neurological:     Mental Status: She is alert and oriented to person, place, and time.     Imaging: US THORACENTESIS ASP PLEURAL SPACE W/IMG GUIDE  Result Date: 08/03/2022 INDICATION: Patient with a history of gastric lymphoma with recurrent pleural effusions. Interventional radiology asked to perform a diagnostic and therapeutic thoracentesis. EXAM: ULTRASOUND GUIDED RIGHT THORACENTESIS MEDICATIONS: 1% lidocaine 10 mL COMPLICATIONS: None immediate. PROCEDURE: An ultrasound guided thoracentesis was thoroughly discussed with the patient and questions answered. The benefits, risks, alternatives and complications were also discussed. The patient understands and wishes to proceed with the procedure. Written consent was obtained. Ultrasound was performed to localize and mark an adequate pocket of fluid in the right chest. The area was then prepped and draped in the normal sterile  fashion. 1% Lidocaine was used for local anesthesia. Under ultrasound guidance a 6 Fr Safe-T-Centesis catheter was introduced. Thoracentesis was performed. The catheter was removed and a dressing applied. FINDINGS: A total of approximately 1 L of clear yellow fluid was removed. Samples were sent to the laboratory as requested by the clinical team. IMPRESSION: Successful ultrasound guided right thoracentesis yielding 1 L of pleural fluid. Read by: Soyla Dryer, NP Electronically Signed   By: Ruthann Cancer M.D.   On: 08/03/2022 13:01   DG Chest Port 1 View  Result Date: 08/03/2022 CLINICAL DATA:  Status post thoracentesis. EXAM: PORTABLE CHEST 1 VIEW COMPARISON:  08/02/2022 FINDINGS: 1151 hours. No evidence for pneumothorax. Cardiopericardial silhouette is at upper limits of normal for size. Vascular congestion with diffuse interstitial opacity. There is bibasilar atelectasis with small bilateral pleural effusions, decreased in the interval. Telemetry leads overlie the chest. IMPRESSION: 1. No evidence for pneumothorax status post thoracentesis. 2. Interval decrease in bilateral pleural effusions. Electronically Signed   By: Misty Stanley M.D.   On: 08/03/2022 12:07   DG Chest 2 View  Result Date: 08/02/2022 CLINICAL DATA:  Shortness of breath EXAM: CHEST - 2 VIEW COMPARISON:  Chest x-ray dated July 13, 2022 FINDINGS: Visualized cardiac and mediastinal contours are unchanged. Right lung granuloma. Moderate bilateral pleural effusions and bibasilar atelectasis. No evidence of pneumothorax. IMPRESSION: Moderate bilateral pleural effusions and bibasilar atelectasis, similar to prior radiograph. Electronically Signed   By: Yetta Glassman M.D.   On: 08/02/2022 16:46    Labs:  CBC: Recent Labs    07/16/22 0644 07/28/22 1236 08/02/22 1623 08/03/22 0954 08/03/22 1956 08/04/22 0542  WBC 5.9 5.1 5.0  --   --  5.3  HGB 7.6* 6.7* 6.4* 7.2* 7.6* 7.8*  HCT 24.4* 21.9* 21.6* 23.5* 25.2* 24.9*  PLT  61* 88* 98*  --   --  103*    COAGS: No results for input(s): "INR", "APTT" in the last 8760 hours.  BMP: Recent Labs    07/16/22 0644 07/28/22 1236 08/02/22 1623 08/04/22 0542  NA 138 137 137 139  K 3.8 4.0 4.2 3.6  CL 97* 100 104 101  CO2 35* '31 27 30  '$ GLUCOSE 299* 246* 156* 149*  BUN 36* 44* 38* 44*  CALCIUM 7.8* 9.5 9.4 8.6*  CREATININE 1.13* 1.15* 0.96 1.47*  GFRNONAA 51* 50* >60 37*    LIVER FUNCTION TESTS: Recent Labs    06/29/22 1442 07/28/22 1236 08/02/22 2106 08/04/22 0542  BILITOT 2.2* 1.2 1.7* 1.7*  AST '24 19 21 22  '$ ALT '11 12 12 10  '$ ALKPHOS 130* 126 126 117  PROT 5.6* 5.3* 5.4* 4.7*  ALBUMIN 3.3* 2.8* 3.0* 2.7*    TUMOR MARKERS: No results for input(s): "AFPTM", "CEA", "CA199", "CHROMGRNA" in the last 8760 hours.  Assessment and Plan:  Marginal zone lymphoma; pending chemotherapy: Lavonne J. Mace, 76 year old female, presents today to the [facility] Interventional Radiology department for an image-guided port-a-catheter placement.  Risks and benefits of image-guided port-a-catheter placement were discussed with the patient including, but not limited to bleeding, infection, pneumothorax, or fibrin sheath development and need for additional procedures.  All of the patient's questions were answered, patient is agreeable to proceed. She has been NPO.   Consent signed and in chart.  Thank you for this interesting consult.  I greatly enjoyed meeting Natasha [name] and look forward to participating in their care.  A copy of this report was sent to the requesting provider on this date.  Electronically Signed: Soyla Dryer, AGACNP-BC [phone] 08/16/2022, 1:45 PM   I spent a total of  30 Minutes   in face to face in clinical consultation, greater than 50% of which was counseling/coordinating care for port-a-catheter placement.

## 2022-08-16 NOTE — Procedures (Signed)
Interventional Radiology Procedure Note  Date of Procedure: 08/16/2022  Procedure: Port placement   Findings:  1. Right chest port placement    Complications: No immediate complications noted. Small neck hematoma noted and marked prior to transfer to recovery.   Estimated Blood Loss: minimal  Follow-up and Recommendations: 1. Ready for use  2. Bedrest 1 hour, monitor small neck hematoma    Albin Felling, MD  Vascular & Interventional Radiology  08/16/2022 2:59 PM

## 2022-08-16 NOTE — Progress Notes (Signed)
Patient ID: Natasha [name], female    DOB: April 22, 1946, 76 y.o.   MRN: [id number]  HPI  Natasha [name] is a 76 y/o female with a history of gastric lymphoma, DM, anemia, COPD, hypotension, pleural effusion, tobacco use and chronic heart failure.   Echo report from 07/13/22 reviewed and showed an EF of 60-65% along with mildly elevated PA pressure, mild MR and mild/moderate TR.   Admitted 08/02/22 due to worsening shortness of breath. Recently treated for covid with paxlovid. Thoracentesis done with removal of 1L. Initially had increased oxygen requirement but then weaned back to baseline of 3L. Given 1 unit PRBC's. Lasix held due to AKI. Discharged after 2 days.   She presents today for her initial visit with a chief complaint of moderate shortness of breath with minimal exertion. Describes this as chronic in nature. She has associated fatigue, cough, pedal edema and abdominal distention along with this.   Reports having quite a bit of urinary incontinence so is currently not taking a diuretic.   Caregiver reports that patient's abdomen has recently measured 43 inches which has increased some recently. She is getting ready to start cancer treatments.   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   CHF (congestive heart failure) ([facility])    COPD (chronic obstructive pulmonary disease) ([facility])    Diabetes mellitus without complication ([facility])    Hypotension    Pleural effusion    [facility] 854m,  2 weeks ago   Vaginal delivery    x 5   Past Surgical History:  Procedure Laterality Date   IR IMAGING GUIDED PORT INSERTION  08/16/2022   TONSILLECTOMY Bilateral    as a child   Family History  Problem Relation Age of Onset   Breast cancer Mother    Alzheimer's disease Father    Lung cancer Father        lung   Varicose Veins Brother    Heart attack Brother    Social History   Tobacco Use   Smoking status: Former    Packs/day:  2.00    Years: 55.00    Total pack years: 110.00    Types: Cigarettes    Start date: 07/07/1961    Quit date: 08/02/2022    Years since quitting: 0.0   Smokeless tobacco: Never   Tobacco comments:    1/2 pack she states but family says 2 PPD  Substance Use Topics   Alcohol use: No   No Known Allergies Prior to Admission medications   Medication Sig Start Date End Date Taking? Authorizing Provider  acyclovir (ZOVIRAX) 400 MG tablet Take 400 mg by mouth daily. 08/09/22  Yes [provider]  albuterol (VENTOLIN HFA) 108 (90 Base) MCG/ACT inhaler TAKE 2 PUFFS BY MOUTH EVERY 6 HOURS AS NEEDED FOR WHEEZE OR SHORTNESS OF BREATH 06/17/22  Yes Sreenath, Sudheer B, MD  docusate sodium (COLACE) 100 MG capsule Take 100 mg by mouth daily as needed for mild constipation.   Yes [provider]  HUMULIN R 100 UNIT/ML injection Inject 5-10 Units into the skin as needed. Sliding scale 08/09/22  Yes [provider]  insulin NPH-regular Human (70-30) 100 UNIT/ML injection Inject 5-10 Units into the skin 2 (two) times daily with a meal. Use only for hyperglycemia, CBG+ 300. Start with 5 units, titrate up to max 10 unit per dose if need. 07/17/22  Yes  Karamalegos, Devonne Doughty, DO  Insulin Syringe-Needle U-100 31G X 15/64" 0.3 ML MISC Use to inject insulin twice a day with meals. 07/17/22  Yes Karamalegos, Devonne Doughty, DO  Ipratropium-Albuterol (COMBIVENT RESPIMAT) 20-100 MCG/ACT AERS respimat Inhale 1 puff into the lungs every 6 (six) hours.   Yes [provider]  levothyroxine (SYNTHROID) 50 MCG tablet Take 1 tablet (50 mcg total) by mouth daily at 6 (six) AM. 07/17/22  Yes Sharen Hones, MD  lidocaine-prilocaine (EMLA) cream Apply to affected area once 07/28/22  Yes Sindy Guadeloupe, MD  loperamide (IMODIUM) 2 MG capsule Take 2 mg by mouth as needed for diarrhea or loose stools.   Yes [provider]  ondansetron (ZOFRAN) 8 MG tablet Take 8 mg by mouth 3 (three) times daily.  08/09/22  Yes [provider]  prochlorperazine (COMPAZINE) 10 MG tablet Take 1 tablet by mouth every 6 (six) hours. 08/09/22  Yes [provider]   Review of Systems  Constitutional:  Positive for fatigue (minimal). Negative for appetite change.  HENT:  Negative for congestion, postnasal drip and sore throat.   Eyes: Negative.   Respiratory:  Positive for cough and shortness of breath (easily). Negative for chest tightness and wheezing.   Cardiovascular:  Positive for leg swelling. Negative for chest pain and palpitations.  Gastrointestinal:  Positive for abdominal distention. Negative for abdominal pain.  Endocrine: Negative.   Genitourinary: Negative.   Musculoskeletal:  Negative for back pain and neck pain.  Skin: Negative.   Allergic/Immunologic: Negative.   Neurological:  Negative for dizziness and light-headedness.  Hematological:  Negative for adenopathy. Does not bruise/bleed easily.  Psychiatric/Behavioral:  Negative for dysphoric mood and sleep disturbance (sleeping on 2 pillows). The patient is not nervous/anxious.    Vitals:   08/16/22 1147  BP: (!) 102/50  Pulse: 84  Resp: 16  SpO2: 100%  Weight: 157 lb 4 oz (71.3 kg)  Height: '5\' 4"'$  (1.626 m)   Wt Readings from Last 3 Encounters:  08/16/22 140 lb (63.5 kg)  08/16/22 157 lb 4 oz (71.3 kg)  07/28/22 150 lb 1.6 oz (68.1 kg)   Lab Results  Component Value Date   CREATININE 0.81 08/16/2022   CREATININE 1.47 (H) 08/04/2022   CREATININE 0.96 08/02/2022   Physical Exam Vitals and nursing note reviewed. Exam conducted with a chaperone present (caregiver).  Constitutional:      Appearance: Normal appearance.  HENT:     Head: Normocephalic and atraumatic.  Cardiovascular:     Rate and Rhythm: Normal rate and regular rhythm.  Pulmonary:     Effort: Pulmonary effort is normal. No respiratory distress.     Breath sounds: No wheezing or rales.  Abdominal:     General: There is distension.      Palpations: Abdomen is soft.     Tenderness: There is no abdominal tenderness.  Musculoskeletal:        General: No tenderness.     Cervical back: Normal range of motion and neck supple.     Right lower leg: Edema (2+ pitting) present.     Left lower leg: Edema (2+ pitting) present.  Skin:    General: Skin is warm and dry.  Neurological:     Mental Status: She is alert and oriented to person, place, and time. Mental status is at baseline.  Psychiatric:        Mood and Affect: Mood normal.        Behavior: Behavior normal.  Thought Content: Thought content normal.   Assessment & Plan:  1: Chronic heart failure with preserved ejection fraction without structural changes- - NYHA class III - euvolemic today - weighing daily; reminded to call for an overnight weight gain of > 2 pounds or a weekly weight gain of > 5 pounds - not adding salt - discussed the importance of keeping daily fluid intake to 60-64 ounces; estimates that she currently drinks 20-30 ounces of water - she says that compression socks hurt her legs; encouraged to try wearing ACE wraps to help with edema of legs - BP will limit diuretic usage - BNP 08/02/22 was 171.4  2: Hypotension- - BP on the low side (102/50) - saw PCP [facility]) 07/23/22 - BMP 08/04/22 reviewed and showed sodium 139, potassium 3.6, creatinine 1.47 and GFR 37  3: COPD- - wearing oxygen at 3L around the clock - has not smoked since recent admission - continued cessation discussed  4: Gastric lymphoma- - recent chemo 08/06/22 - saw oncology Janese Banks) 07/28/22   Medication list reviewed  Return in 6 weeks or sooner for any questions/problems before then.

## 2022-08-16 NOTE — H&P (Addendum)
History and Physical    Patient: Natasha [name] MCN:[id number] DOB: 1945-11-01 DOA: 08/16/2022 DOS: the patient was seen and examined on 08/16/2022 PCP: [name], NP  Patient coming from:  [facility]  Chief Complaint: No chief complaint on file.  HPI: Natasha [name] is a 76 y.o. female with medical history significant of chronic respiratory failure with COPD on 3 L nasal cannula oxygen continuous, type II diabetes on insulin, tobacco use, recurrent pleural effusion, recently diagnosed with Marginal zone lymphoma, is being admitted after patient became hypoxic requiring 15 L through non-rebreather after port placement. Discussed with interventional radiologist and according to him patient laid almost one hour flat for the procedure. Natasha [name] became short winded sats dropped 87 to 90%. Was placed on 15 L not rebreather. During my evaluation patient was feeling a bit better. Natasha [name] is down to 5 L nasal cannula oxygen. Saturations remains 8792%. Denies any chest pain. Natasha [name] is alert oriented times three. No family at bedside. Chest x-ray reviewed showed right effusion versus atelectasis. Chronic COPD changes. Her dose of Solu-Medrol 60 mg times one and Lasix 40 mg. Will continue breathing treatment. Review of Systems: As mentioned in the history of present illness. All other systems reviewed and are negative. 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 Her smoking use included cigarettes. Natasha [name] started smoking about 61 years ago. Natasha [name] has a 110.00 pack-year smoking history. Natasha [name] has never used  smokeless tobacco. Natasha [name] reports that Natasha [name] does not drink alcohol and does not use drugs.  No Known Allergies  Family History  Problem Relation Age of Onset   Breast cancer Mother    Alzheimer's disease Father    Lung cancer Father        lung   Varicose Veins Brother    Heart attack Brother     Prior to Admission medications   Medication Sig Start Date End Date Taking? Authorizing Provider  albuterol (VENTOLIN HFA) 108 (90 Base) MCG/ACT inhaler TAKE 2 PUFFS BY MOUTH EVERY 6 HOURS AS NEEDED FOR WHEEZE OR SHORTNESS OF BREATH 06/17/22  Yes Sreenath, Sudheer B, MD  levothyroxine (SYNTHROID) 50 MCG tablet Take 1 tablet (50 mcg total) by mouth daily at 6 (six) AM. 07/17/22  Yes ZSharen Hones MD  acyclovir (ZOVIRAX) 400 MG tablet Take 400 mg by mouth daily. 08/09/22   [provider]  docusate sodium (COLACE) 100 MG capsule Take 100 mg by mouth daily as needed for mild constipation.    [provider]  HUMULIN R 100 UNIT/ML injection Inject 5-10 Units into the skin as needed. Sliding scale 08/09/22   [provider]  insulin NPH-regular Human (70-30) 100 UNIT/ML injection Inject 5-10 Units into the skin 2 (two) times daily with a meal. Use only for hyperglycemia, CBG+ 300. Start with 5 units, titrate up to max 10 unit per dose if need. 07/17/22   KOlin Hauser DO  Insulin Syringe-Needle U-100 31G X 15/64" 0.3 ML MISC Use  to inject insulin twice a day with meals. 07/17/22   Karamalegos, Devonne Doughty, DO  Ipratropium-Albuterol (COMBIVENT RESPIMAT) 20-100 MCG/ACT AERS respimat Inhale 1 puff into the lungs every 6 (six) hours.    [provider]  lidocaine-prilocaine (EMLA) cream Apply to affected area once 07/28/22   Sindy Guadeloupe, MD  loperamide (IMODIUM) 2 MG capsule Take 2 mg by mouth as needed for diarrhea or loose stools.    [provider]  ondansetron (ZOFRAN) 8 MG tablet Take 8 mg by mouth 3 (three) times daily. 08/09/22   [provider]  prochlorperazine (COMPAZINE) 10 MG tablet Take 1 tablet by mouth every 6 (six) hours. 08/09/22   [provider]    Physical Exam: Vitals:   08/16/22 1511 08/16/22 1523 08/16/22 1530 08/16/22 1545  BP:  (!) 98/56 117/65 97/65  Pulse: 99 87 90 90  Resp: (!) '23 20 20 17  '$ Temp:      TempSrc:      SpO2: (!) 66% 100% 95% (!) 87%  Weight:      Height:       Physical Exam Constitutional:      Appearance: Normal appearance.  Eyes:     Pupils: Pupils are equal, round, and reactive to light.  Cardiovascular:     Rate and Rhythm: Tachycardia present.  Pulmonary:     Effort: Respiratory distress present.     Comments: Decreased breath sounds bilaterally places more on the right and left. Abdominal:     General: Bowel sounds are normal.     Palpations: Abdomen is soft.  Musculoskeletal:     Right lower leg: Edema present.     Left lower leg: Edema present.     Comments: Chronic bilateral lower extremity pitting edema.  Skin:    General: Skin is warm.  Neurological:     General: No focal deficit present.     Mental Status: Natasha [name] is alert and oriented to person, place, and time.      Assessment and Plan:   Natasha [name] is a 76 y.o. female with medical history significant of chronic respiratory failure with COPD on 3 L nasal cannula oxygen continuous, type II diabetes on insulin, tobacco use, recurrent pleural effusion, recently diagnosed with gastric lymphoma, is being admitted after patient became hypoxic requiring 15 L through non-rebreather after post placement.  Acute on chronic hypoxic respiratory failure secondary to COPD exacerbation, suspected right pleural effusion recurrent versus atelectasis and congestive heart failure Acute on chronic diastolic -- admit patient to telemetry cardiac -- continue IV Lasix, IV Solu-Medrol, bronchodilators/nebulizer -- ultrasound-guided thoracentesis tomorrow -- PRN BiPAP if needed  Newly diagnosed Marginal zone  lymphoma -- status post port placement 08/16/22 -- follows with Dr. Janese Banks  Type II diabetes, well-controlled -- last A1c 5.5 -- continue sliding scale  Chronic anemia/thrombocytopenia -- monitor labs -- will check CBC and metabolic panel  Recurrent right pleural effusion -- patient had recently removed 850 mL on 08/03/2022 -- ultrasound thoracentesis ordered for tomorrow  Hypothyroidism -- continue Synthroid   Advance Care Planning:   Code Status: Full Code   Consults: IR  Family Communication: none  Severity of Illness: The appropriate patient status for this patient is INPATIENT. Inpatient status is judged to be reasonable and necessary in order to provide the required intensity of service to ensure the patient's safety. The patient's presenting symptoms, physical exam findings, and initial radiographic and laboratory data in the context of their chronic comorbidities is felt to  place them at high risk for further clinical deterioration. Furthermore, it is not anticipated that the patient will be medically stable for discharge from the hospital within 2 midnights of admission.   * I certify that at the point of admission it is my clinical judgment that the patient will require inpatient hospital care spanning beyond 2 midnights from the point of admission due to high intensity of service, high risk for further deterioration and high frequency of surveillance required.*  Author: Fritzi Mandes, MD 08/16/2022 4:44 PM  For on call review [email or web address].

## 2022-08-17 ENCOUNTER — Inpatient Hospital Stay: Payer: Medicare Other

## 2022-08-17 ENCOUNTER — Other Ambulatory Visit: Payer: Medicare Other

## 2022-08-17 ENCOUNTER — Inpatient Hospital Stay: Payer: Medicare Other | Admitting: Internal Medicine

## 2022-08-17 ENCOUNTER — Other Ambulatory Visit: Payer: Self-pay

## 2022-08-17 ENCOUNTER — Ambulatory Visit: Payer: Medicare Other

## 2022-08-17 ENCOUNTER — Ambulatory Visit: Payer: Medicare Other | Admitting: Internal Medicine

## 2022-08-17 DIAGNOSIS — D649 Anemia, unspecified: Secondary | ICD-10-CM | POA: Diagnosis not present

## 2022-08-17 DIAGNOSIS — I509 Heart failure, unspecified: Secondary | ICD-10-CM | POA: Diagnosis not present

## 2022-08-17 DIAGNOSIS — J00 Acute nasopharyngitis [common cold]: Secondary | ICD-10-CM | POA: Diagnosis not present

## 2022-08-17 DIAGNOSIS — J90 Pleural effusion, not elsewhere classified: Secondary | ICD-10-CM | POA: Diagnosis not present

## 2022-08-17 DIAGNOSIS — Z794 Long term (current) use of insulin: Secondary | ICD-10-CM | POA: Diagnosis not present

## 2022-08-17 DIAGNOSIS — C8580 Other specified types of non-Hodgkin lymphoma, unspecified site: Secondary | ICD-10-CM | POA: Diagnosis not present

## 2022-08-17 DIAGNOSIS — E1122 Type 2 diabetes mellitus with diabetic chronic kidney disease: Secondary | ICD-10-CM | POA: Diagnosis not present

## 2022-08-17 DIAGNOSIS — C8590 Non-Hodgkin lymphoma, unspecified, unspecified site: Secondary | ICD-10-CM | POA: Diagnosis not present

## 2022-08-17 DIAGNOSIS — E119 Type 2 diabetes mellitus without complications: Secondary | ICD-10-CM | POA: Diagnosis not present

## 2022-08-17 DIAGNOSIS — E039 Hypothyroidism, unspecified: Secondary | ICD-10-CM | POA: Diagnosis not present

## 2022-08-17 DIAGNOSIS — I5033 Acute on chronic diastolic (congestive) heart failure: Secondary | ICD-10-CM | POA: Diagnosis not present

## 2022-08-17 DIAGNOSIS — M6281 Muscle weakness (generalized): Secondary | ICD-10-CM | POA: Diagnosis not present

## 2022-08-17 DIAGNOSIS — R2689 Other abnormalities of gait and mobility: Secondary | ICD-10-CM | POA: Diagnosis not present

## 2022-08-17 DIAGNOSIS — J441 Chronic obstructive pulmonary disease with (acute) exacerbation: Secondary | ICD-10-CM | POA: Diagnosis not present

## 2022-08-17 DIAGNOSIS — D61818 Other pancytopenia: Secondary | ICD-10-CM | POA: Diagnosis not present

## 2022-08-17 DIAGNOSIS — J9621 Acute and chronic respiratory failure with hypoxia: Secondary | ICD-10-CM | POA: Diagnosis not present

## 2022-08-17 DIAGNOSIS — Z9981 Dependence on supplemental oxygen: Secondary | ICD-10-CM | POA: Diagnosis not present

## 2022-08-17 DIAGNOSIS — M62830 Muscle spasm of back: Secondary | ICD-10-CM | POA: Diagnosis not present

## 2022-08-17 DIAGNOSIS — F17210 Nicotine dependence, cigarettes, uncomplicated: Secondary | ICD-10-CM | POA: Diagnosis not present

## 2022-08-17 DIAGNOSIS — J449 Chronic obstructive pulmonary disease, unspecified: Secondary | ICD-10-CM | POA: Diagnosis not present

## 2022-08-17 LAB — GLUCOSE, CAPILLARY
Glucose-Capillary: 77 mg/dL (ref 70–99)
Glucose-Capillary: 199 mg/dL — ABNORMAL HIGH (ref 70–99)
Glucose-Capillary: 470 mg/dL — ABNORMAL HIGH (ref 70–99)

## 2022-08-17 MED ORDER — PREDNISONE 20 MG PO TABS: 20.0000 mg | ORAL_TABLET | Freq: Every day | ORAL | 0 refills | Status: AC

## 2022-08-17 MED ORDER — LIDOCAINE HCL (PF) 1 % IJ SOLN
10.0000 mL | Freq: Once | INTRAMUSCULAR | Status: AC
  Administered 2022-08-17: 10 mL via INTRADERMAL

## 2022-08-17 MED ORDER — DOCUSATE SODIUM 100 MG PO CAPS: 100.0000 mg | ORAL_CAPSULE | Freq: Every day | ORAL | Status: DC | PRN

## 2022-08-17 MED ORDER — HYDROCODONE-ACETAMINOPHEN 5-325 MG PO TABS: 1.0000 | ORAL_TABLET | Freq: Three times a day (TID) | ORAL | 0 refills | Status: DC | PRN

## 2022-08-17 MED ORDER — ACYCLOVIR 200 MG PO CAPS
400.0000 mg | ORAL_CAPSULE | Freq: Every day | ORAL | Status: DC
  Administered 2022-08-17: 400 mg via ORAL
  Filled 2022-08-17: qty 2

## 2022-08-17 MED ORDER — IPRATROPIUM-ALBUTEROL 0.5-2.5 (3) MG/3ML IN SOLN
3.0000 mL | Freq: Three times a day (TID) | RESPIRATORY_TRACT | Status: DC
  Administered 2022-08-17: 3 mL via RESPIRATORY_TRACT
  Filled 2022-08-17 (×2): qty 3

## 2022-08-17 MED ORDER — SIMETHICONE 80 MG PO CHEW: 160.0000 mg | CHEWABLE_TABLET | Freq: Four times a day (QID) | ORAL | Status: DC | PRN

## 2022-08-17 MED ORDER — PROCHLORPERAZINE MALEATE 10 MG PO TABS
10.0000 mg | ORAL_TABLET | Freq: Four times a day (QID) | ORAL | Status: DC
  Administered 2022-08-17: 10 mg via ORAL
  Filled 2022-08-17 (×4): qty 1

## 2022-08-17 MED ORDER — TRAMADOL HCL 50 MG PO TABS
50.0000 mg | ORAL_TABLET | Freq: Four times a day (QID) | ORAL | Status: DC | PRN
  Administered 2022-08-17: 50 mg via ORAL
  Filled 2022-08-17: qty 1

## 2022-08-17 MED ORDER — INSULIN ASPART PROT & ASPART (70-30 MIX) 100 UNIT/ML ~~LOC~~ SUSP
5.0000 [IU] | Freq: Two times a day (BID) | SUBCUTANEOUS | Status: DC
  Filled 2022-08-17: qty 10

## 2022-08-17 MED ORDER — INSULIN NPH ISOPHANE & REGULAR (70-30) 100 UNIT/ML ~~LOC~~ SUSP: 5.0000 [IU] | Freq: Two times a day (BID) | SUBCUTANEOUS | 0 refills | Status: DC

## 2022-08-17 NOTE — Care Management CC44 (Signed)
Condition Code 44 Documentation Completed  Patient Details  Name: Natasha [name] MRN: [id number] Date of Birth: November 23, 1945   Condition Code 44 given:  Yes Patient signature on Condition Code 44 notice:  Yes Documentation of 2 MD's agreement:  Yes Code 44 added to claim:  Yes    Candie Chroman, LCSW 08/17/2022, 2:48 PM

## 2022-08-17 NOTE — Discharge Instructions (Addendum)
Use your bronchodilators/nebulizer and oxygen as before.  Low Sodium Nutrition Therapy  Eating less sodium can help you if you have high blood pressure, heart failure, or kidney or liver disease.   Your body needs a little sodium, but too much sodium can cause your body to hold onto extra water. This extra water will raise your blood pressure and can cause damage to your heart, kidneys, or liver as they are forced to work harder.   Sometimes you can see how the extra fluid affects you because your hands, legs, or belly swell. You may also hold water around your heart and lungs, which makes it hard to breathe.   Even if you take medication for blood pressure or a water pill (diuretic) to remove fluid, it is still important to have less salt in your diet.   Check with your primary care provider before drinking alcohol since it may affect the amount of fluid in your body and how your heart, kidneys, or liver work. Sodium in Food A low-sodium meal plan limits the sodium that you get from food and beverages to 1,500-2,000 milligrams (mg) per day. Salt is the main source of sodium. Read the nutrition label on the package to find out how much sodium is in one serving of a food.  Select foods with 140 milligrams (mg) of sodium or less per serving.  You may be able to eat one or two servings of foods with a little more than 140 milligrams (mg) of sodium if you are closely watching how much sodium you eat in a day.  Check the serving size on the label. The amount of sodium listed on the label shows the amount in one serving of the food. So, if you eat more than one serving, you will get more sodium than the amount listed.  Tips Cutting Back on Sodium Eat more fresh foods.  Fresh fruits and vegetables are low in sodium, as well as frozen vegetables and fruits that have no added juices or sauces.  Fresh meats are lower in sodium than processed meats, such as bacon, sausage, and hotdogs.  Not all processed  foods are unhealthy, but some processed foods may have too much sodium.  Eat less salt at the table and when cooking. One of the ingredients in salt is sodium.  One teaspoon of table salt has 2,300 milligrams of sodium.  Leave the salt out of recipes for pasta, casseroles, and soups. Be a Paramedic.  Food packages that say "Salt-free", sodium-free", "very low sodium," and "low sodium" have less than 140 milligrams of sodium per serving.  Beware of products identified as "Unsalted," "No Salt Added," "Reduced Sodium," or "Lower Sodium." These items may still be high in sodium. You should always check the nutrition label. Add flavors to your food without adding sodium.  Try lemon juice, lime juice, or vinegar.  Dry or fresh herbs add flavor.  Buy a sodium-free seasoning blend or make your own at home. You can purchase salt-free or sodium-free condiments like barbeque sauce in stores and online. Ask your registered dietitian nutritionist for recommendations and where to find them.   Eating in Restaurants Choose foods carefully when you eat outside your home. Restaurant foods can be very high in sodium. Many restaurants provide nutrition facts on their menus or their websites. If you cannot find that information, ask your server. Let your server know that you want your food to be cooked without salt and that you would like your salad  dressing and sauces to be served on the side.    Foods Recommended Food Group Foods Recommended  Grains Bread, bagels, rolls without salted tops Homemade bread made with reduced-sodium baking powder Cold cereals, especially shredded wheat and puffed rice Oats, grits, or cream of wheat Pastas, quinoa, and rice Popcorn, pretzels or crackers without salt Corn tortillas  Protein Foods Fresh meats and fish; [place] bacon (check the nutrition labels - make sure they are not packaged in a sodium solution) Canned or packed tuna (no more than 4 ounces at 1  serving) Beans and peas Soybeans) and tofu Eggs Nuts or nut butters without salt  Dairy Milk or milk powder Plant milks, such as rice and soy Yogurt, including Greek yogurt Small amounts of natural cheese (blocks of cheese) or reduced-sodium cheese can be used in moderation. (Swiss, ricotta, and fresh mozzarella cheese are lower in sodium than the others) Cream Cheese Low sodium cottage cheese  Vegetables Fresh and frozen vegetables without added sauces or salt Homemade soups (without salt) Low-sodium, salt-free or sodium-free canned vegetables and soups  Fruit Fresh and canned fruits Dried fruits, such as raisins, cranberries, and prunes  Oils Tub or liquid margarine, regular or without salt Canola, corn, peanut, olive, safflower, or sunflower oils  Condiments Fresh or dried herbs such as basil, bay leaf, dill, mustard (dry), nutmeg, paprika, parsley, rosemary, sage, or thyme.  Low sodium ketchup Vinegar  Lemon or lime juice Pepper, red pepper flakes, and cayenne. Hot sauce contains sodium, but if you use just a drop or two, it will not add up to much.  Salt-free or sodium-free seasoning mixes and marinades Simple salad dressings: vinegar and oil   Foods Not Recommended Food Group Foods Not Recommended  Grains Breads or crackers topped with salt Cereals (hot/cold) with more than 300 mg sodium per serving Biscuits, cornbread, and other "quick" breads prepared with baking soda Pre-packaged bread crumbs Seasoned and packaged rice and pasta mixes Self-rising flours  Protein Foods Cured meats: Bacon, ham, sausage, pepperoni and hot dogs Canned meats (chili, vienna sausage, or sardines) Smoked fish and meats Frozen meals that have more than 600 mg of sodium per serving Egg substitute (with added sodium)  Dairy Buttermilk Processed cheese spreads Cottage cheese (1 cup may have over 500 mg of sodium; look for low-sodium.) American or feta cheese Shredded Cheese has more sodium  than blocks of cheese String cheese  Vegetables Canned vegetables (unless they are salt-free, sodium-free or low sodium) Frozen vegetables with seasoning and sauces Sauerkraut and pickled vegetables Canned or dried soups (unless they are salt-free, sodium-free, or low sodium) [place] fries and onion rings  Fruit Dried fruits preserved with additives that have sodium  Oils Salted butter or margarine, all types of olives  Condiments Salt, sea salt, kosher salt, onion salt, and garlic salt Seasoning mixes with salt Bouillon cubes Ketchup Barbeque sauce and Worcestershire sauce unless low sodium Soy sauce Salsa, pickles, olives, relish Salad dressings: ranch, blue cheese, [place], and [place].   Low Sodium Sample 1-Day Menu  Breakfast 1 cup cooked oatmeal  1 slice whole wheat bread toast  1 tablespoon peanut butter without salt  1 banana  1 cup 1% milk  Lunch Tacos made with: 2 corn tortillas   cup black beans, low sodium   cup roasted or grilled chicken (without skin)   avocado  Squeeze of lime juice  1 cup salad greens  1 tablespoon low-sodium salad dressing   cup strawberries  1 orange  Afternoon  Snack 1/3 cup grapes  6 ounces yogurt  Evening Meal 3 ounces herb-baked fish  1 baked potato  2 teaspoons olive oil   cup cooked carrots  2 thick slices tomatoes on:  2 lettuce leaves  1 teaspoon olive oil  1 teaspoon balsamic vinegar  1 cup 1% milk  Evening Snack 1 apple   cup almonds without salt   Low-Sodium Vegetarian (Lacto-Ovo) Sample 1-Day Menu  Breakfast 1 cup cooked oatmeal  1 slice whole wheat toast  1 tablespoon peanut butter without salt  1 banana  1 cup 1% milk  Lunch Tacos made with: 2 corn tortillas   cup black beans, low sodium   cup roasted or grilled chicken (without skin)   avocado  Squeeze of lime juice  1 cup salad greens  1 tablespoon low-sodium salad dressing   cup strawberries  1 orange  Evening Meal Stir fry made with:  cup tofu   1 cup brown rice   cup broccoli   cup green beans   cup peppers   tablespoon peanut oil  1 orange  1 cup 1% milk  Evening Snack 4 strips celery  2 tablespoons hummus  1 hard-boiled egg   Low-Sodium Vegan Sample 1-Day Menu  Breakfast 1 cup cooked oatmeal  1 tablespoon peanut butter without salt  1 cup blueberries  1 cup soymilk fortified with calcium, vitamin B12, and vitamin D  Lunch 1 small whole wheat pita   cup cooked lentils  2 tablespoons hummus  4 carrot sticks  1 medium apple  1 cup soymilk fortified with calcium, vitamin B12, and vitamin D  Evening Meal Stir fry made with:  cup tofu  1 cup brown rice   cup broccoli   cup green beans   cup peppers   tablespoon peanut oil  1 cup cantaloupe  Evening Snack 1 cup soy yogurt   cup mixed nuts  Copyright 2020  Academy of [employer] and Dietetics. All rights reserved  Sodium Free Flavoring Tips  When cooking, the following items may be used for flavoring instead of salt or seasonings that contain sodium. Remember: A little bit of spice goes a long way! Be careful not to overseason. Spice Blend Recipe (makes about ? cup) 5 teaspoons onion powder  2 teaspoons garlic powder  2 teaspoons paprika  2 teaspoon dry mustard  1 teaspoon crushed thyme leaves   teaspoon white pepper   teaspoon celery seed Food Item Flavorings  Beef Basil, bay leaf, caraway, curry, dill, dry mustard, garlic, grape jelly, green pepper, mace, marjoram, mushrooms (fresh), nutmeg, onion or onion powder, parsley, pepper, rosemary, sage  Chicken Basil, cloves, cranberries, mace, mushrooms (fresh), nutmeg, oregano, paprika, parsley, pineapple, saffron, sage, savory, tarragon, thyme, tomato, turmeric  Egg Chervil, curry, dill, dry mustard, garlic or garlic powder, green pepper, jelly, mushrooms (fresh), nutmeg, onion powder, paprika, parsley, rosemary, tarragon, tomato  Fish Basil, bay leaf, chervil, curry, dill, dry mustard, green  pepper, lemon juice, marjoram, mushrooms (fresh), paprika, pepper, tarragon, tomato, turmeric  Lamb Cloves, curry, dill, garlic or garlic powder, mace, mint, mint jelly, onion, oregano, parsley, pineapple, rosemary, tarragon, thyme  Pork Applesauce, basil, caraway, chives, cloves, garlic or garlic powder, onion or onion powder, rosemary, thyme  Veal Apricots, basil, bay leaf, currant jelly, curry, ginger, marjoram, mushrooms (fresh), oregano, paprika  Vegetables Basil, dill, garlic or garlic powder, ginger, lemon juice, mace, marjoram, nutmeg, onion or onion powder, tarragon, tomato, sugar or sugar substitute, salt-free salad dressing, vinegar  Desserts Allspice,  anise, cinnamon, cloves, ginger, mace, nutmeg, vanilla extract, other extracts   Copyright 2020  Academy of [employer] and Dietetics. All rights reserved  Fluid Restricted Nutrition Therapy  You have been prescribed this diet because your condition affects how much fluid you can eat or drink. If your heart, liver, or kidneys aren't working properly, you may not be able to effectively eliminate fluids from the body and this may cause swelling (edema) in the legs, arms, and/or stomach. Drink no more than _________ liters or ________ ounces or ________cups of fluid per day.  You don't need to stop eating or drinking the same fluids you normally would, but you may need to eat or drink less than usual.  Your registered dietitian nutritionist will help you determine the correct amount of fluid to consume during the day Breakfast Include fluids taken with medications  Lunch Include fluids taken with medications  Dinner Include fluids taken with medications  Bedtime Snack Include fluids taken with medications     Tips What Are Fluids?  A fluid is anything that is liquid or anything that would melt if left at room temperature. You will need to count these foods and liquids--including any liquid used to take medication--as part of your daily  fluid intake. Some examples are: Alcohol (drink only with your doctor's permission)  Coffee, tea, and other hot beverages  Gelatin (Jell-O)  Gravy  Ice cream, sherbet, sorbet  Ice cubes, ice chips  Milk, liquid creamer  Nutritional supplements  Popsicles  Vegetable and fruit juices; fluid in canned fruit  Watermelon  Yogurt  Soft drinks, lemonade, limeade  Soups  Syrup How Do I Measure My Fluid Intake? Record your fluid intake daily.  Tip: Every day, each time you eat or drink fluids, pour water in the same amount into an empty container that can hold the same amount of fluids you are allowed daily. This may help you keep track of how much fluid you are taking in throughout the day.  To accurately keep track of how much liquid you take in, measure the size of the cups, glasses, and bowls you use. If you eat soup, measure how much of it is liquid and how much is solid (such as noodles, vegetables, meat). Conversions for Measuring Fluid Intake  Milliliters (mL) Liters (L) Ounces (oz) Cups (c)  1000 1 32 4  1200 1.2 40 5  1500 1.5 50 6 1/4  1800 1.8 60 7 1/2  2000 2 67 8 1/3  Tips to Reduce Your Thirst Chew gum or suck on hard candy.  Rinse or gargle with mouthwash. Do not swallow.  Ice chips or popsicles my help quench thirst, but this too needs to be calculated into the total restriction. Melt ice chips or cubes first to figure out how much fluid they produce (for example, experiment with melting  cup ice chips or 2 ice cubes).  Add a lemon wedge to your water.  Limit how much salt you take in. A high salt intake might make you thirstier.  Don't eat or drink all your allowed liquids at once. Space your liquids out through the day.  Use small glasses and cups and sip slowly. If allowed, take your medications with fluids you eat or drink during a meal.   Fluid-Restricted Nutrition Therapy Sample 1-Day Menu  Breakfast 1 slice wheat toast  1 tablespoon peanut butter  1/2 cup yogurt  (120 milliliters)  1/2 cup blueberries  1 cup milk (240 milliliters)   Lunch  3 ounces sliced [place]  2 slices whole wheat bread  1/2 cup lettuce for sandwich  2 slices tomato for sandwich  1 ounce reduced-fat, reduced-sodium cheese  1/2 cup fresh carrot sticks  1 banana  1 cup unsweetened tea (240 milliliters)   Evening Meal 8 ounces soup (240 milliliters)  3 ounces salmon  1/2 cup quinoa  1 cup green beans  1 cup mixed greens salad  1 tablespoon olive oil  1 cup coffee (240 milliliters)  Evening Snack 1/2 cup sliced peaches  1/2 cup frozen yogurt (120 milliliters)  1 cup water (240 milliliters)  Copyright 2020  Academy of Nutrition and Dietetics. All rights reserved

## 2022-08-17 NOTE — Consult Note (Signed)
   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  Mild mitral regurgitation.  Mild to moderate tricuspid regurgitation.  Elevated pulmonary artery systolic pressures.  She admitted after becoming hypoxic during the procedure for insertion of port.  Requiring 15 L of oxygen through nonrebreather.  Comorbidities:  COPD Type 2 diabetes Previous tobacco abuse Gastric lymphoma Anemia Chronic kidney disease stage III  Medications:  Levothyroxine 50 mcg daily Lasix 40 mg IV x1  Labs:  Sodium 138, potassium 4.2, chloride 103, CO2 29, BUN 27, creatinine 0.81, hemoglobin 7.2, hematocrit 24.6 Weight is 63.5 kg Blood pressure 99/62 Intake 260 mL Output 1600 mL   Initial meeting with patient on this admission.  She is sitting up in bed, states she is still not quite back to and her normal self.  She states that she is scheduled for repeat thoracentesis today.  Yesterday 850 mL was removed from the right pleural space.  States at home that she had not seen any change in her weight that were performed daily.  She also was sticking with the fluid restriction.  She had been seen at the outpatient heart failure clinic prior to her procedure yesterday.  We will continue to follow along.  She had no further questions.  Pricilla Riffle RN [facility]

## 2022-08-17 NOTE — Discharge Summary (Addendum)
Physician Discharge Summary   Patient: Natasha [name] MRN: [id number] DOB: December 12, 1945  Admit date:     08/16/2022  Discharge date: 08/17/22  Discharge Physician: Fritzi Mandes   PCP: [name], NP   Recommendations at discharge:   follow-up at the cancer center on your scheduled appointment to start your therapy. 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 female with medical history significant of chronic respiratory failure with COPD on 3 L nasal cannula oxygen continuous, type II diabetes on insulin, tobacco use, recurrent pleural effusion, recently diagnosed with gastric lymphoma, is being admitted after patient became hypoxic requiring 15 L through non-rebreather after post placement.   Acute on chronic hypoxic respiratory failure secondary to COPD exacerbation, suspected right pleural effusion recurrent versus atelectasis and congestive heart failure Acute on chronic diastolic -- admit patient to telemetry cardiac -- continue IV Lasix, IV Solu-Medrol, bronchodilators/nebulizer -- patient underwent right-sided thoracentesis with remote of 850 mL fluid. X-ray looks better. Patient's oxygen requirement is down to 3 L her baseline. -- She responded well to IV Lasix given to times one yesterday -- PO steroid for 3 to 4 days  Newly diagnosed Marginal zone lymphoma -- status post port placement 08/16/22 -- follows with Dr. Janese Banks-- patient will be seen at the cancer center on her scheduled appointment for her treatment   Type II diabetes, well-controlled -- last A1c 5.5 -- continue sliding scale -- continue 7035 units BID   Chronic anemia/thrombocytopenia -- monitor labs --  hemoglobin 7.2   Recurrent right pleural effusion -- patient had recently removed 850 mL on 08/03/2022 -- ultrasound thoracentesis removed 850 mL on 10/ 24/23   Hypothyroidism -- continue Synthroid    Advance Care Planning:   Code Status: Full Code    Consults: IR   Family Communication: [name] --caregiver  Discharge plan discussed with patient. Home health PT RN will be arranged. Patient in agreement.       Pain control - [employer] Controlled Substance Reporting System database was reviewed. and patient was instructed, not to drive, operate heavy machinery, perform activities at heights, swimming or participation in water activities or provide baby-sitting services while on Pain, Sleep and Anxiety Medications; until their outpatient Physician has advised to do so again. Also recommended to not to take more than prescribed Pain, Sleep and Anxiety Medications.  Consultants: IR Procedures performed: right-sided thoracentesis, port placement Disposition: Home health Diet recommendation:  Discharge Diet Orders (From admission, onward)     Start     Ordered   08/17/22 0000  Diet - low sodium heart healthy        08/17/22 1357           Carb modified diet DISCHARGE MEDICATION: Allergies as of 08/17/2022   No Known Allergies      Medication List     TAKE these medications    acyclovir 400 MG tablet Commonly known as: ZOVIRAX Take 400 mg by mouth daily.   albuterol 108 (90 Base) MCG/ACT inhaler Commonly known as: VENTOLIN HFA TAKE 2 PUFFS BY MOUTH EVERY 6 HOURS AS NEEDED FOR WHEEZE OR SHORTNESS OF BREATH   Combivent Respimat 20-100 MCG/ACT Aers respimat Generic drug: Ipratropium-Albuterol Inhale 1 puff into the lungs every  6 (six) hours.   HYDROcodone-acetaminophen 5-325 MG tablet Commonly known as: Norco Take 1 tablet by mouth every 8 (eight) hours as needed for moderate pain.   insulin NPH-regular Human (70-30) 100 UNIT/ML injection Inject 5 Units into  the skin 2 (two) times daily with a meal. Use only for hyperglycemia, CBG+ 300. Start with 5 units, titrate up to max 10 unit per dose if need.   insulin regular 100 units/mL injection Commonly known as: NOVOLIN R Inject 5-10 Units into the skin 3 (three) times daily before meals. BG 200-299 give 5 units BG 300-399 give 10 units.   Insulin Syringe-Needle U-100 31G X 15/64" 0.3 ML Misc Use to inject insulin twice a day with meals.   levothyroxine 50 MCG tablet Commonly known as: SYNTHROID Take 1 tablet (50 mcg total) by mouth daily at 6 (six) AM.   lidocaine-prilocaine cream Commonly known as: EMLA Apply to affected area once What changed:  how much to take additional instructions   loperamide 2 MG capsule Commonly known as: IMODIUM Take 2 mg by mouth as needed for diarrhea or loose stools.   ondansetron 8 MG tablet Commonly known as: ZOFRAN Take 8 mg by mouth every 8 (eight) hours as needed for nausea, vomiting or refractory nausea / vomiting. Take 1 tablet q6H for 4 days after chemotherapy.   predniSONE 20 MG tablet Commonly known as: DELTASONE Take 1 tablet (20 mg total) by mouth daily for 4 days.   prochlorperazine 10 MG tablet Commonly known as: COMPAZINE Take 1 tablet by mouth every 6 (six) hours as needed for nausea or vomiting. Take 1 tab q6H for 4 days after chemotherapy.   simethicone 80 MG chewable tablet Commonly known as: MYLICON Chew 376 mg by mouth every 6 (six) hours as needed for flatulence. Take 1 capsule PO PRN gas relief.        Follow-up Information     [name], NP. Schedule an appointment as soon as possible for a visit in 1 week(s).   Specialties: Nurse Practitioner, Family Medicine Contact information: [place] [address] [phone]         Sindy Guadeloupe, MD. Go to.   Specialty: Oncology Why: On your scheduled appointment Contact information: [place] [address] [phone]                 Discharge Exam: Danley Danker Weights   08/16/22 1320  Weight: 63.5 kg     Condition at discharge: fair  The results of significant diagnostics from this hospitalization (including imaging, microbiology, ancillary and laboratory) are listed below for reference.   Imaging Studies: US THORACENTESIS ASP PLEURAL SPACE W/IMG GUIDE  Result Date: 08/17/2022 INDICATION: Patient with history of gastric lymphoma with recurrent pleural effusions. Request is for therapeutic thoracentesis. EXAM: ULTRASOUND GUIDED THERAPEUTIC RIGHT-SIDED THORACENTESIS MEDICATIONS: Lidocaine 1% 10 mL COMPLICATIONS: None immediate. PROCEDURE: An ultrasound guided thoracentesis was thoroughly discussed with the patient and questions answered. The benefits, risks, alternatives and complications were also discussed. The patient understands and wishes to proceed with the procedure. Written consent was obtained. Ultrasound was performed to localize and mark an adequate pocket of fluid in the RIGHT chest. The area was then prepped and draped in the normal sterile fashion. 1% Lidocaine was used for local anesthesia. Under ultrasound guidance a 6 Fr Safe-T-Centesis catheter was introduced. Thoracentesis was performed. The catheter was removed and a dressing applied. FINDINGS: A total of approximately 850 mL of straw-colored fluid was removed.  IMPRESSION: Successful ultrasound guided therapeutic RIGHT-sided thoracentesis yielding 850 mL of pleural fluid. Read by: Rushie Nyhan, NP Electronically Signed   By: Michaelle Birks M.D.   On: 08/17/2022 13:23   DG Chest Port 1 View  Result Date: 08/17/2022 CLINICAL DATA:  Post right-sided thoracentesis EXAM: PORTABLE CHEST 1 VIEW COMPARISON:  08/16/2022; 08/03/2022; chest CT-07/12/2022 FINDINGS: Grossly unchanged enlarged cardiac silhouette and mediastinal contours. Stable positioning of support apparatus. Interval reduction in persistent small right-sided effusion post thoracentesis.  No pneumothorax. Improved aeration the right lung base with persistent right basilar heterogeneous/consolidative opacities. Unchanged small left-sided effusion associated left basilar opacities. Pulmonary vasculature remains indistinct with cephalization of flow. No acute osseous abnormalities. IMPRESSION: 1. Interval reduction in persistent small right-sided effusion post thoracentesis. No pneumothorax. 2. Improved aeration the right lung base with otherwise similar findings of pulmonary edema, small left-sided effusion and associated bibasilar opacities, likely atelectasis. Electronically Signed   By: Sandi Mariscal M.D.   On: 08/17/2022 13:12   DG Chest Port 1 View  Result Date: 08/16/2022 CLINICAL DATA:  Shortness of breath. EXAM: PORTABLE CHEST 1 VIEW COMPARISON:  08/03/2022 FINDINGS: Right jugular Port-A-Cath with the tip in the lower SVC region. Markedly increased densities in the mid and lower right chest that could represent a large pleural fluid and/or airspace disease. Again noted are prominent lung markings bilaterally which are similar to the previous examination. Negative for pneumothorax. Heart size is grossly stable. Slightly increased densities at the left lung base could represent atelectasis or pleural fluid. IMPRESSION: 1. Markedly increased densities in the mid and lower right chest. Findings could represent a large pleural effusion and/or airspace disease. 2. Prominent lung markings bilaterally are similar to the previous examination and could represent vascular congestion or pulmonary edema. Electronically Signed   By: Markus Daft M.D.   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 A  total of Versed 1 mg and Fentanyl 50 mcg was administered intravenously. Moderate Sedation Time: 25 minutes. The patient's level of consciousness and vital signs were monitored continuously by radiology nursing throughout the procedure under my direct supervision. FLUOROSCOPY TIME:  Fluoroscopy Time: 0.5 minutes (5 mGy) COMPLICATIONS: None immediate. PROCEDURE: Informed written consent was obtained from the patient after a thorough discussion of the procedural risks, benefits and alternatives. All questions were addressed. Maximal Sterile Barrier Technique was utilized including caps, mask, sterile gowns, sterile gloves, sterile drape, hand hygiene and skin antiseptic. A timeout was performed prior to the initiation of the procedure. The patient was placed supine on the exam table. The right neck and chest was prepped and draped in the standard sterile fashion. A preliminary ultrasound of the right neck was performed and demonstrates a patent right internal jugular vein. A permanent ultrasound image was stored in the electronic medical record. The overlying skin was anesthetized with 1% Lidocaine. Using ultrasound guidance, access was obtained into the right internal jugular vein using a 21 gauge micropuncture set. A wire was advanced into the SVC, a short incision was made at the puncture site, and serial dilatation performed. Next, in an ipsilateral infraclavicular location, an incision was made at the site of the subcutaneous reservoir. Blunt dissection was used to open a pocket to contain the reservoir. A subcutaneous tunnel was then  created from the port site to the puncture site. A(n) 8 Fr single lumen catheter was advanced through the tunnel. The catheter was attached to the port and this was placed in the subcutaneous pocket. Under fluoroscopic guidance, a peel away sheath was placed, and the catheter was trimmed to the appropriate length and was advanced into the central veins. The catheter length is 24 cm.  The tip of the catheter lies near the superior cavoatrial junction. The port flushes and aspirates appropriately. The port was flushed and locked with heparinized saline. The port pocket was closed in 2 layers using 3-0 and 4-0 Vicryl/absorbable suture. Dermabond was also applied to both incisions. The patient was transferred to recovery in stable condition. IMPRESSION: Successful placement of a right chest port via the right internal jugular vein. The port is ready for immediate use. Electronically Signed   By: Albin Felling M.D.   On: 08/16/2022 15:11   US THORACENTESIS ASP PLEURAL SPACE W/IMG GUIDE  Result Date: 08/03/2022 INDICATION: Patient with a history of gastric lymphoma with recurrent pleural effusions. Interventional radiology asked to perform a diagnostic and therapeutic thoracentesis. EXAM: ULTRASOUND GUIDED RIGHT THORACENTESIS MEDICATIONS: 1% lidocaine 10 mL COMPLICATIONS: None immediate. PROCEDURE: An ultrasound guided thoracentesis was thoroughly discussed with the patient and questions answered. The benefits, risks, alternatives and complications were also discussed. The patient understands and wishes to proceed with the procedure. Written consent was obtained. Ultrasound was performed to localize and mark an adequate pocket of fluid in the right chest. The area was then prepped and draped in the normal sterile fashion. 1% Lidocaine was used for local anesthesia. Under ultrasound guidance a 6 Fr Safe-T-Centesis catheter was introduced. Thoracentesis was performed. The catheter was removed and a dressing applied. FINDINGS: A total of approximately 1 L of clear yellow fluid was removed. Samples were sent to the laboratory as requested by the clinical team. IMPRESSION: Successful ultrasound guided right thoracentesis yielding 1 L of pleural fluid. Read by: Soyla Dryer, NP Electronically Signed   By: Ruthann Cancer M.D.   On: 08/03/2022 13:01   DG Chest Port 1 View  Result Date:  08/03/2022 CLINICAL DATA:  Status post thoracentesis. EXAM: PORTABLE CHEST 1 VIEW COMPARISON:  08/02/2022 FINDINGS: 1151 hours. No evidence for pneumothorax. Cardiopericardial silhouette is at upper limits of normal for size. Vascular congestion with diffuse interstitial opacity. There is bibasilar atelectasis with small bilateral pleural effusions, decreased in the interval. Telemetry leads overlie the chest. IMPRESSION: 1. No evidence for pneumothorax status post thoracentesis. 2. Interval decrease in bilateral pleural effusions. Electronically Signed   By: Misty Stanley M.D.   On: 08/03/2022 12:07   DG Chest 2 View  Result Date: 08/02/2022 CLINICAL DATA:  Shortness of breath EXAM: CHEST - 2 VIEW COMPARISON:  Chest x-ray dated July 13, 2022 FINDINGS: Visualized cardiac and mediastinal contours are unchanged. Right lung granuloma. Moderate bilateral pleural effusions and bibasilar atelectasis. No evidence of pneumothorax. IMPRESSION: Moderate bilateral pleural effusions and bibasilar atelectasis, similar to prior radiograph. Electronically Signed   By: Yetta Glassman M.D.   On: 08/02/2022 16:46    Microbiology: Results for orders placed or performed during the hospital encounter of 08/02/22  SARS Coronavirus 2 by RT PCR (hospital order, performed in [facility] hospital lab) *cepheid single result test* Anterior Nasal Swab     Status: Abnormal   Collection Time: 08/02/22  9:06 PM   Specimen: Anterior Nasal Swab  Result Value Ref Range Status   SARS Coronavirus 2 by RT  PCR POSITIVE (A) NEGATIVE Final    Comment: (NOTE) SARS-CoV-2 target nucleic acids are DETECTED  SARS-CoV-2 RNA is generally detectable in upper respiratory specimens  during the acute phase of infection.  Positive results are indicative  of the presence of the identified virus, but do not rule out bacterial infection or co-infection with other pathogens not detected by the test.  Clinical correlation with patient history  and  other diagnostic information is necessary to determine patient infection status.  The expected result is negative.  Fact Sheet for Patients:   https://www.Yesennia Hirota.info/   Fact Sheet for Healthcare Providers:   https://hall.com/    This test is not yet approved or cleared by the [place] FDA and  has been authorized for detection and/or diagnosis of SARS-CoV-2 by FDA under an Emergency Use Authorization (EUA).  This EUA will remain in effect (meaning this test can be used) for the duration of  the COVID-19 declaration under Section 564(b)(1)  of the Act, 21 U.S.C. section 360-bbb-3(b)(1), unless the authorization is terminated or revoked sooner.   Performed at [facility], [address] [place], [place] [address]     Labs: CBC: Recent Labs  Lab 08/16/22 1702  WBC 5.6  HGB 7.2*  HCT 24.6*  MCV 93.5  PLT 132*   Basic Metabolic Panel: Recent Labs  Lab 08/16/22 1702  NA 138  K 4.2  CL 103  CO2 29  GLUCOSE 132*  BUN 27*  CREATININE 0.81  CALCIUM 9.3   Liver Function Tests: No results for input(s): "AST", "ALT", "ALKPHOS", "BILITOT", "PROT", "ALBUMIN" in the last 168 hours. CBG: Recent Labs  Lab 08/16/22 1322 08/16/22 1535 08/16/22 2059 08/17/22 0748  GLUCAP 78 77 322* 199*    Discharge time spent: greater than 30 minutes.  Signed: Fritzi Mandes, MD [facility] 08/17/2022

## 2022-08-17 NOTE — Progress Notes (Signed)
Referring Physician(s): Sindy Guadeloupe  Supervising Physician: Michaelle Birks  Patient Status:  [facility] - In-pt  Chief Complaint:  Respiratory worsening.   Subjective:  Patient seen at [place]. Denies any complaints at this time.   Allergies: Patient has no known allergies.  Medications: Prior to Admission medications   Medication Sig Start Date End Date Taking? Authorizing Provider  acyclovir (ZOVIRAX) 400 MG tablet Take 400 mg by mouth daily. 08/09/22  Yes [provider]  albuterol (VENTOLIN HFA) 108 (90 Base) MCG/ACT inhaler TAKE 2 PUFFS BY MOUTH EVERY 6 HOURS AS NEEDED FOR WHEEZE OR SHORTNESS OF BREATH 06/17/22  Yes Sreenath, Sudheer B, MD  insulin NPH-regular Human (70-30) 100 UNIT/ML injection Inject 5-10 Units into the skin 2 (two) times daily with a meal. Use only for hyperglycemia, CBG+ 300. Start with 5 units, titrate up to max 10 unit per dose if need. Patient taking differently: Inject 5 Units into the skin 2 (two) times daily with a meal. Use only for hyperglycemia, CBG+ 300. Start with 5 units, titrate up to max 10 unit per dose if need. 07/17/22  Yes Karamalegos, Devonne Doughty, DO  insulin regular (NOVOLIN R) 100 units/mL injection Inject 5-10 Units into the skin 3 (three) times daily before meals. BG 200-299 give 5 units BG 300-399 give 10 units.   Yes [provider]  Insulin Syringe-Needle U-100 31G X 15/64" 0.3 ML MISC Use to inject insulin twice a day with meals. 07/17/22  Yes Karamalegos, Devonne Doughty, DO  Ipratropium-Albuterol (COMBIVENT RESPIMAT) 20-100 MCG/ACT AERS respimat Inhale 1 puff into the lungs every 6 (six) hours.   Yes [provider]  levothyroxine (SYNTHROID) 50 MCG tablet Take 1 tablet (50 mcg total) by mouth daily at 6 (six) AM. 07/17/22  Yes Sharen Hones, MD  lidocaine-prilocaine (EMLA) cream Apply to affected area once Patient taking differently: 1 Application. Apply to mediport site 2 hours before infusion 07/28/22  Yes Sindy Guadeloupe, MD  loperamide (IMODIUM) 2 MG capsule Take 2 mg by mouth as needed for diarrhea or loose stools.   Yes [provider]  ondansetron (ZOFRAN) 8 MG tablet Take 8 mg by mouth every 8 (eight) hours as needed for nausea, vomiting or refractory nausea / vomiting. Take 1 tablet q6H for 4 days after chemotherapy. 08/09/22  Yes [provider]  prochlorperazine (COMPAZINE) 10 MG tablet Take 1 tablet by mouth every 6 (six) hours as needed for nausea or vomiting. Take 1 tab q6H for 4 days after chemotherapy. 08/09/22  Yes [provider]  simethicone (MYLICON) 80 MG chewable tablet Chew 180 mg by mouth every 6 (six) hours as needed for flatulence. Take 1 capsule PO PRN gas relief.   Yes [provider]     Vital Signs: BP 99/62 (BP Location: Left Arm)   Pulse 78   Temp 98.1 F (36.7 C)   Resp 16   Ht '5\' 5"'$  (1.651 m)   Wt 140 lb (63.5 kg)   SpO2 96%   BMI 23.30 kg/m   Physical Exam Vitals and nursing note reviewed.  Constitutional:      Appearance: She is well-developed.  HENT:     Head: Normocephalic and atraumatic.  Eyes:     Conjunctiva/sclera: Conjunctivae normal.  Cardiovascular:     Rate and Rhythm: Normal rate.  Pulmonary:     Comments: On o2. Some use of accessory muscles. Bilateral pleural effusions noted on [place] right greater then left.  Musculoskeletal:  General: Normal range of motion.     Cervical back: Normal range of motion.  Skin:    General: Skin is warm.  Neurological:     Mental Status: She is alert and oriented to person, place, and time.     Imaging: DG Chest Port 1 View  Result Date: 08/16/2022 CLINICAL DATA:  Shortness of breath. EXAM: PORTABLE CHEST 1 VIEW COMPARISON:  08/03/2022 FINDINGS: Right jugular Port-A-Cath with the tip in the lower SVC region. Markedly increased densities in the mid and lower right chest that could represent a large pleural fluid and/or airspace disease. Again noted are prominent  lung markings bilaterally which are similar to the previous examination. Negative for pneumothorax. Heart size is grossly stable. Slightly increased densities at the left lung base could represent atelectasis or pleural fluid. IMPRESSION: 1. Markedly increased densities in the mid and lower right chest. Findings could represent a large pleural effusion and/or airspace disease. 2. Prominent lung markings bilaterally are similar to the previous examination and could represent vascular congestion or pulmonary edema. Electronically Signed   By: Markus Daft M.D.   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 A total of Versed 1 mg and Fentanyl 50 mcg was administered intravenously. Moderate Sedation Time: 25 minutes. The patient's level of consciousness and vital signs were monitored continuously by radiology nursing throughout the procedure under my direct supervision. FLUOROSCOPY TIME:  Fluoroscopy Time: 0.5 minutes (5 mGy) COMPLICATIONS: None immediate. PROCEDURE: Informed written consent was obtained from the patient after a thorough discussion of the procedural risks, benefits and alternatives. All questions were addressed. Maximal Sterile Barrier Technique was utilized including caps, mask, sterile gowns, sterile gloves, sterile drape, hand hygiene and skin antiseptic. A timeout was performed prior to the initiation of the procedure. The patient was placed supine on the exam table. The right neck and chest was prepped and draped in the standard sterile fashion. A preliminary ultrasound of the right neck was performed and demonstrates a patent right internal jugular vein. A permanent ultrasound image was  stored in the electronic medical record. The overlying skin was anesthetized with 1% Lidocaine. Using ultrasound guidance, access was obtained into the right internal jugular vein using a 21 gauge micropuncture set. A wire was advanced into the SVC, a short incision was made at the puncture site, and serial dilatation performed. Next, in an ipsilateral infraclavicular location, an incision was made at the site of the subcutaneous reservoir. Blunt dissection was used to open a pocket to contain the reservoir. A subcutaneous tunnel was then created from the port site to the puncture site. A(n) 8 Fr single lumen catheter was advanced through the tunnel. The catheter was attached to the port and this was placed in the subcutaneous pocket. Under fluoroscopic guidance, a peel away sheath was placed, and the catheter was trimmed to the appropriate length and was advanced into the central veins. The catheter length is 24 cm. The tip of the catheter lies near the superior cavoatrial junction. The port flushes and aspirates appropriately. The port was flushed and locked with heparinized saline. The port pocket was closed in 2 layers using 3-0 and 4-0 Vicryl/absorbable suture. Dermabond was also applied to both incisions. The patient was transferred to recovery in stable condition.  IMPRESSION: Successful placement of a right chest port via the right internal jugular vein. The port is ready for immediate use. Electronically Signed   By: Albin Felling M.D.   On: 08/16/2022 15:11    Labs:  CBC: Recent Labs    07/28/22 1236 08/02/22 1623 08/03/22 0954 08/03/22 1956 08/04/22 0542 08/16/22 1702  WBC 5.1 5.0  --   --  5.3 5.6  HGB 6.7* 6.4* 7.2* 7.6* 7.8* 7.2*  HCT 21.9* 21.6* 23.5* 25.2* 24.9* 24.6*  PLT 88* 98*  --   --  103* 108*    COAGS: No results for input(s): "INR", "APTT" in the last 8760 hours.  BMP: Recent Labs    07/28/22 1236 08/02/22 1623 08/04/22 0542 08/16/22 1702  NA 137 137 139 138  K  4.0 4.2 3.6 4.2  CL 100 104 101 103  CO2 '31 27 30 29  '$ GLUCOSE 246* 156* 149* 132*  BUN 44* 38* 44* 27*  CALCIUM 9.5 9.4 8.6* 9.3  CREATININE 1.15* 0.96 1.47* 0.81  GFRNONAA 50* >60 37* >60    LIVER FUNCTION TESTS: Recent Labs    06/29/22 1442 07/28/22 1236 08/02/22 2106 08/04/22 0542  BILITOT 2.2* 1.2 1.7* 1.7*  AST '24 19 21 22  '$ ALT '11 12 12 10  '$ ALKPHOS 130* 126 126 117  PROT 5.6* 5.3* 5.4* 4.7*  ALBUMIN 3.3* 2.8* 3.0* 2.7*    Assessment and Plan:  76 y.o. female inpatient. Known to IR. Smoker. History of  DM, COPD, gastric lymphoma with recurrent pleural effusions. Patient presented to  IR on 10.23.23 as an outpatient for portacath placement for chemotherapy access. Found to have worsening respiratory status post procedure. Patient was admitted to the hospitalist Team for further evaluation and intervention.  Chest xray from 10.23.23 shows bilateral pleural effusions with right sided greater than left. 850 ml was removed in [place]. Patient unable to tolerate additional fluid removal at this time.   Labs from 10.23.23 BUN 23, Hgb 72. Per Epic patient was reporting  pain over port site better with ocycodone.  IR will continue to follow along.  Electronically Signed: Jacqualine Mau, NP 08/17/2022, 12:14 PM   I spent a total of 15 Minutes at the patient's bedside AND on the patient's hospital floor or unit, greater than 50% of which was counseling/coordinating care for portacath placement.

## 2022-08-17 NOTE — Plan of Care (Signed)

## 2022-08-17 NOTE — Consult Note (Addendum)
   [facility] Inpatient Consult   08/17/2022  Natasha [name] 15-Oct-1946 [id number]  [place] Organization [ACO] Patient: [place] [facility] Liaison remote coverage review for [facility]  Primary Care Provider:  [name], NP, [facility], is listed to provide the transition of care follow up.  Patient is currently active with [place] [facility] for care coordination.  Patient has been engaged by a [place].  Our community based plan of care has focused on disease management and community resource support.    Brief review reveals patient was for a planned procedure. No new needs noted.   Plan: No new needs noted.  [facility] RNCM already aware.   Of note, [facility] Care Management services does not replace or interfere with any services that are needed or arranged by inpatient [facility] care management team.  For additional questions or referrals please contact:  Natividad Brood, RN BSN [place]  [phone] business mobile phone Toll free office [phone]  *[place]  [phone] Fax number: [phone] [email or web address]

## 2022-08-17 NOTE — Care Management Obs Status (Signed)
[place] NOTIFICATION   Patient Details  Name: Milaina Sher Dansereau MRN: [id number] Date of Birth: 1946-08-09   Medicare Observation Status Notification Given:  Yes    Candie Chroman, LCSW 08/17/2022, 2:48 PM

## 2022-08-17 NOTE — Progress Notes (Signed)
Enrolled patient into the [facility] 

## 2022-08-17 NOTE — Evaluation (Signed)
Physical Therapy Evaluation Patient Details Name: Natasha [name] MRN: [id number] DOB: 1945/12/23 Today's Date: 08/17/2022  History of Present Illness  Pt is a 76 y.o. female admitted to hospital after pt became hypoxic requiring 15 L through non-rebreather after port placement 10/23.  Pt admitted with acute on chronic hypoxic respiratory failure secondary to COPD exacerbation, newly diagnosed marginal zone lymphoma, and recurrent R pleural effusion.  PMH includes gastric lymphoma, DM, anemia, COPD (3 L chronic O2 use), hypotension, pleural effusion, tobacco use, chronic heart failure.  Clinical Impression  Prior to hospital admission, pt was modified independent with household ambulation (held onto wall/furniture as needed for balance; pt declining to use any AD/DME for walking at home and also during session); lives with roommate/caregiver in 1 level home with 2-3 STE (no railing); uses 3 L home O2 baseline; currently receiving HHPT.  Currently pt is modified independent semi-supine to sitting edge of bed; SBA with transfers; and CGA to ambulate up to 36 feet (UE support on furniture as needed).  Limited distance ambulating d/t SOB and wheezing (O2 sats 89% post ambulation and 93% or greater at rest on 4 L O2 via nasal cannula during sessions activities).  9/10 pain at new R chest port site (nursing notified and brought pt pain meds).  Pt would benefit from BSC/3 in 1 at home d/t difficulty getting off of regular height toilet at home.  Pt would benefit from skilled PT to address noted impairments and functional limitations (see below for any additional details).  Upon hospital discharge, pt would benefit from continued HHPT and assist at home as needed.    Recommendations for follow up therapy are one component of a multi-disciplinary discharge planning process, led by the attending physician.  Recommendations may be updated based on patient status, additional functional criteria and insurance  authorization.  Follow Up Recommendations Home health PT      Assistance Recommended at Discharge Set up Supervision/Assistance  Patient can return home with the following  A little help with walking and/or transfers;A little help with bathing/dressing/bathroom;Assistance with cooking/housework;Assist for transportation;Help with stairs or ramp for entrance    Equipment Recommendations BSC/3in1  Recommendations for Other Services       Functional Status Assessment Patient has had a recent decline in their functional status and demonstrates the ability to make significant improvements in function in a reasonable and predictable amount of time.     Precautions / Restrictions Precautions Precautions: Fall Restrictions Weight Bearing Restrictions: No      Mobility  Bed Mobility Overal bed mobility: Modified Independent             General bed mobility comments: Semi-supine to sitting with mild increased effort to perform on own    Transfers Overall transfer level: Needs assistance Equipment used: None Transfers: Sit to/from Stand, Bed to chair/wheelchair/BSC Sit to Stand: Supervision   Step pivot transfers: Supervision       General transfer comment: x1 transfer from bed and x1 transfer from recliner (appropriate use of UE's to assist with transfers); stand step turn bed to recliner    Ambulation/Gait Ambulation/Gait assistance: Min guard Gait Distance (Feet):  (36 feet; 12 feet) Assistive device:  (pt holding onto furniture as needed while walking) Gait Pattern/deviations: Decreased step length - right, Decreased step length - left Gait velocity: decreased     General Gait Details: occasional UE support on furniture for Hotel manager  Modified Rankin (Stroke Patients Only)       Balance Overall balance assessment: Needs assistance Sitting-balance support: No upper extremity supported, Feet supported Sitting  balance-Leahy Scale: Normal Sitting balance - Comments: steady sitting reaching outside BOS   Standing balance support: During functional activity (UE support on furniture as needed) Standing balance-Leahy Scale: Good Standing balance comment: no loss of balance noted with ambulation                             Pertinent Vitals/Pain Pain Assessment Pain Assessment: 0-10 Pain Score: 9  Pain Location: R chest port pain Pain Descriptors / Indicators: Aching, Tender, Sore Pain Intervention(s): Limited activity within patient's tolerance, Monitored during session, RN gave pain meds during session HR WFL during sessions activities.    Home Living Family/patient expects to be discharged to:: Private residence Living Arrangements: Other (Comment) (Roommate/caregiver) Available Help at Discharge:  (caregiver) Type of Home: House Home Access: Stairs to enter Entrance Stairs-Rails: None Entrance Stairs-Number of Steps: 2-3   Home Layout: One level Home Equipment: Hospital bed (pt's roommate has RW and [facility]) Additional Comments: 1 fall on 07/11/22    Prior Function Prior Level of Function : Needs assist             Mobility Comments: Modified independent ambulating within home (per caregiver pt is a "wall walker").  3 L chronic home O2 use.  Receiving HHPT. ADLs Comments: Pt reports difficulty getting off of regular toilet at home     Hand Dominance        Extremity/Trunk Assessment   Upper Extremity Assessment Upper Extremity Assessment: Overall WFL for tasks assessed    Lower Extremity Assessment Lower Extremity Assessment: Generalized weakness    Cervical / Trunk Assessment Cervical / Trunk Assessment: Other exceptions Cervical / Trunk Exceptions: forward head/shoulders  Communication   Communication: No difficulties  Cognition Arousal/Alertness: Awake/alert Behavior During Therapy: WFL for tasks assessed/performed Overall Cognitive Status: History of  cognitive impairments - at baseline                                 General Comments: Pt's caregiver clarifying information as needed.        General Comments  Nursing cleared pt for participation in physical therapy.  Pt agreeable to PT session.  Pt's caregiver/roommate present during sessions activities.    Exercises  Vc's for pursed lip breathing.   Assessment/Plan    PT Assessment Patient needs continued PT services  PT Problem List Decreased strength;Decreased activity tolerance;Decreased balance;Decreased mobility;Decreased knowledge of use of DME;Decreased knowledge of precautions;Cardiopulmonary status limiting activity;Pain       PT Treatment Interventions DME instruction;Gait training;Stair training;Functional mobility training;Therapeutic activities;Therapeutic exercise;Balance training;Patient/family education    PT Goals (Current goals can be found in the Care Plan section)  Acute Rehab PT Goals Patient Stated Goal: to go home; to improve pain PT Goal Formulation: With patient Time For Goal Achievement: 08/31/22 Potential to Achieve Goals: Good    Frequency Min 2X/week     Co-evaluation               AM-PAC PT "6 Clicks" Mobility  Outcome Measure Help needed turning from your back to your side while in a flat bed without using bedrails?: None Help needed moving from lying on your back to sitting on the side of a flat bed without using bedrails?: None  Help needed moving to and from a bed to a chair (including a wheelchair)?: A Little Help needed standing up from a chair using your arms (e.g., wheelchair or bedside chair)?: A Little Help needed to walk in hospital room?: A Little Help needed climbing 3-5 steps with a railing? : A Little 6 Click Score: 20    End of Session Equipment Utilized During Treatment: Gait belt;Oxygen (4 L O2 via nasal cannula) Activity Tolerance: Other (comment) (limited d/t SOB with activity)   Nurse  Communication: Mobility status;Patient requests pain meds;Precautions PT Visit Diagnosis: Other abnormalities of gait and mobility (R26.89);Muscle weakness (generalized) (M62.81);History of falling (Z91.81)    Time: 9528-4132 PT Time Calculation (min) (ACUTE ONLY): 34 min   Charges:   PT Evaluation $PT Eval Low Complexity: 1 Low PT Treatments $Therapeutic Activity: 8-22 mins       Leitha Bleak, PT 08/17/22, 9:37 AM

## 2022-08-17 NOTE — Procedures (Addendum)
Ultrasound-guided therapeutic right sided thoracentesis performed yielding 850 milliliters of straw colored fluid. No immediate complications. Follow-up chest x-ray pending. EBL is < 2 ml. Patient unable to tolerate additional fluid removal at this time.

## 2022-08-17 NOTE — Progress Notes (Addendum)
Nutrition Brief Note  RD pulled to chart secondary to CHF.   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  Pt admitted with respiratory failure secondary to COPD exacerbation, suspected rt pleural effusion, and CHF.   10/23- rt chest port placement 10/24- s/p rt sided thoracentesis (850 ml removed)  Reviewed I/O's: -1.3 L x 24 hours  UOP: 1.6 L x 24 hours  Spoke with pt caregiver ([name]), who approached RD in hallway. He is seeking education from RD, as pt has multiple diet restrictions including CHF, DM, and beginning to start chemo. Per [name], he is going downstairs to process some paperwork, but will be present in the room this afternoon. Pt just diagnosed with lymphoma recently.   Attempted to speak with pt around 1215, however, pt or family not present. RD visited pt again; pt was present, but no caregiver present. Per pt, she is being discharged today and is working with her daughter to get a ride home.   Spoke with pt at bedside, who reports feeling better after thoracentesis. Pt reports that she has a good appetite, but diet is limited. She states "I'm not allowed to eat anything" and typically only eats fruit at meals. She is afraid of her diet "turning into bad sugar" if she does not eat. RD reviewed signs and symptoms of hypoglycemia and importance of eating on a consistent meal  schedule to prevent hypoglycemic episodes. Pt has a DexCom, which she uses at home and usually gets readings within the 70-130's.   Pt consumed about 75% of potatoes, but reports "they are bad for me". RD discussed elements of a general healthful diet with consistent meal pattern to prevent hypoglycemic episodes. RD also discussed importance of sodium restriction to help prevent further fluid retention.  Reviewed wt hx; pt has experienced a 14.3% wt loss over the past 4 months, which is significant for time frame. Suspect some wt loss may be related to fluid losses.   RD provided "Low Sodium Nutrition Therapy" handout from [facility] Nutrition Care Manual; RD also referred to [facility]'s Nutrition and Diabetes Services for further reinforcement, as pt seems to have received some misinformation regarding her diet.   Medications reviewed and include solu-medrol and senokot.   Lab Results  Component Value Date   HGBA1C 5.5 06/17/2022  PTA DM medications are 5-10 units humulin as needed and 5-10 units insulin NPH-regular BID.   Labs reviewed: CBGS: [id number] (inpatient orders for glycemic control are 0-5 units insulin aspart daily at bedtime, 0-9 units insulin aspart TID with meals, and 5 units insulin aspart protamine-aspart BID with meals).    Current diet order is Heart Healthy (liberalize to 2 gram sodium), patient is consuming approximately 50% of meals at this time. Labs and medications reviewed.   No nutrition interventions warranted at this time. If nutrition issues arise, please consult RD.  Loistine Chance, RD, LDN, [place] Registered Dietitian II Certified Diabetes Care and Education Specialist Please refer to [facility] for RD and/or RD on-call/weekend/after hours pager

## 2022-08-17 NOTE — TOC Initial Note (Signed)
Transition of Care [facility]) - Initial/Assessment Note    Patient Details  Name: Natasha [name] MRN: [id number] Date of Birth: 07/19/1946  Transition of Care [facility]) CM/SW Contact:    Candie Chroman, LCSW Phone Number: 08/17/2022, 11:08 AM  Clinical Narrative:   This CSW completed readmission prevention screen on 10/11: "PCP is [name], NP. Daughter drives her to appointments. [facility] [address]. No issues obtaining medications. Patient was active with [facility] prior to admission. Liaison is aware of plan for discharge today. Patient has home oxygen through [employer]. No other DME use prior to admission."  Called [facility] liaison and confirmed patient is still active with PT and RN. No further concerns. CSW will continue to follow patient for support and facilitate return home once stable.               Expected Discharge Plan: [place] Barriers to Discharge: Continued Medical Work up   Patient Goals and CMS Choice     Choice offered to / list presented to : NA  Expected Discharge Plan and Services Expected Discharge Plan: [place] Acute Care Choice: Resumption of Svcs/PTA Provider Living arrangements for the past 2 months: Single Family Home Expected Discharge Date: 08/16/22                         HH Arranged: RN, PT [place] Agency: [place] Date [facility] Agency Contacted: 08/17/22   Representative spoke with at [place]: Sharmon Revere  Prior Living Arrangements/Services Living arrangements for the past 2 months: [place] Lives with:: Roommate Patient language and need for interpreter reviewed:: Yes        Need for Family Participation in Patient Care: Yes (Comment) Care giver support system in place?: Yes (comment) Current home services: DME, Home PT, Home RN Criminal Activity/Legal Involvement Pertinent to Current Situation/Hospitalization: No - Comment as  needed  Activities of Daily Living Home Assistive Devices/Equipment: None ADL Screening (condition at time of admission) Patient's cognitive ability adequate to safely complete daily activities?: Yes Is the patient deaf or have difficulty hearing?: No Does the patient have difficulty seeing, even when wearing glasses/contacts?: No Does the patient have difficulty concentrating, remembering, or making decisions?: No Patient able to express need for assistance with ADLs?: Yes Does the patient have difficulty dressing or bathing?: No Independently performs ADLs?: Yes (appropriate for developmental age) Is this a change from baseline?: Pre-admission baseline Is this a change from baseline?: Pre-admission baseline Does the patient have difficulty walking or climbing stairs?: No Weakness of Legs: None Weakness of Arms/Hands: None  Permission Sought/Granted                  Emotional Assessment Appearance:: Appears stated age     Orientation: : Oriented to Self, Oriented to Place, Oriented to  Time, Oriented to Situation Alcohol / Substance Use: Not Applicable Psych Involvement: No (comment)  Admission diagnosis:  Lymphoma ([place]) [C85.90] Acute on chronic respiratory failure with hypoxemia ([place]) [J96.21] Patient Active Problem List   Diagnosis Date Noted   Acute on chronic respiratory failure with hypoxemia ([place]) 08/16/2022   Acute on chronic anemia 08/02/2022   CHF exacerbation ([place]) 08/02/2022   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   Swelling of lower extremity 07/24/2022   Hypotension 07/24/2022   Goals of care, counseling/discussion    COVID-19 virus infection 07/14/2022  Acute on chronic diastolic CHF (congestive heart failure) ([place]) 07/14/2022   Anemia of chronic disease 07/14/2022   Thrombocytopenia ([place]) 07/14/2022   Hypothyroidism 07/14/2022   Chronic kidney disease, stage 3a ([place]) 07/14/2022   Bilateral pleural effusion 07/12/2022   Diabetes mellitus, type II  ([employer]) 07/12/2022   Acute on chronic respiratory failure with hypoxia ([place]) 07/12/2022   Recurrent pleural effusion 05/12/2022   Lower extremity edema 05/12/2022   Pancytopenia ([place]) 04/16/2022   Type 2 diabetes mellitus with proteinuria ([place]) 04/09/2022   Atherosclerosis of aorta (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   Primary osteoarthritis of both knees 07/07/2018   Senile purpura ([place]) 07/07/2018   PCP:  [name], NP Pharmacy:   [facility], [address] [place] [address] Phone: [phone] Fax: [phone]     Social Determinants of Health (SDOH) Interventions    Readmission Risk Interventions    08/17/2022   11:05 AM 08/04/2022    2:54 PM  Readmission Risk Prevention Plan  Transportation Screening Complete Complete  PCP or Specialist Appt within 5-7 Days  Complete  PCP or Specialist Appt within 3-5 Days Complete   Home Care Screening  Complete  Medication Review (RN CM)  Complete  HRI or Home Care Consult Complete   Social Work Consult for [place] Planning/Counseling Complete   Palliative Care Screening Not Applicable   Medication Review Press photographer) Complete

## 2022-08-17 NOTE — TOC Transition Note (Addendum)
Transition of Care [facility]) - CM/SW Discharge Note   Patient Details  Name: Natasha [name] MRN: [id number] Date of Birth: Jul 02, 1946  Transition of Care [facility]) CM/SW Contact:  Candie Chroman, LCSW Phone Number: 08/17/2022, 2:49 PM   Clinical Narrative:   Patient has orders to discharge home today. [place] liaison is aware. Roommate will pick her up. Patient said she has her oxygen in the room. She declined 3-in-1. No further concerns. CSW signing off.  Final next level of care: [place] Barriers to Discharge: Barriers Resolved   Patient Goals and CMS Choice     Choice offered to / list presented to : NA  Discharge Placement                Patient to be transferred to facility by: Roommate   Patient and family notified of of transfer: 08/17/22  Discharge Plan and Services     Post Acute Care Choice: Resumption of Svcs/PTA Provider                    HH Arranged: RN, PT [facility] Agency: [place] Date [place]: 08/17/22   Representative spoke with at [place]: [place] ([place]) Interventions     Readmission Risk Interventions    08/17/2022   11:05 AM 08/04/2022    2:54 PM  Readmission Risk Prevention Plan  Transportation Screening Complete Complete  PCP or Specialist Appt within 5-7 Days  Complete  PCP or Specialist Appt within 3-5 Days Complete   Home Care Screening  Complete  Medication Review (RN CM)  Complete  HRI or Home Care Consult Complete   Social Work Consult for [place] Planning/Counseling Complete   Palliative Care Screening Not Applicable   Medication Review Press photographer) Complete

## 2022-08-18 ENCOUNTER — Inpatient Hospital Stay: Payer: Medicare Other

## 2022-08-18 ENCOUNTER — Telehealth: Payer: Self-pay

## 2022-08-18 DIAGNOSIS — D61818 Other pancytopenia: Secondary | ICD-10-CM | POA: Diagnosis not present

## 2022-08-18 DIAGNOSIS — C8590 Non-Hodgkin lymphoma, unspecified, unspecified site: Secondary | ICD-10-CM | POA: Diagnosis not present

## 2022-08-18 DIAGNOSIS — M62830 Muscle spasm of back: Secondary | ICD-10-CM | POA: Diagnosis not present

## 2022-08-18 NOTE — Telephone Encounter (Signed)
Entered in error  [place] Direct Dial [phone]

## 2022-08-19 ENCOUNTER — Ambulatory Visit (INDEPENDENT_AMBULATORY_CARE_PROVIDER_SITE_OTHER): Payer: Medicare Other | Admitting: Nurse Practitioner

## 2022-08-19 ENCOUNTER — Encounter: Payer: Self-pay | Admitting: Licensed Clinical Social Worker

## 2022-08-19 ENCOUNTER — Telehealth: Payer: Self-pay | Admitting: *Deleted

## 2022-08-19 DIAGNOSIS — C8590 Non-Hodgkin lymphoma, unspecified, unspecified site: Secondary | ICD-10-CM

## 2022-08-19 DIAGNOSIS — R739 Hyperglycemia, unspecified: Secondary | ICD-10-CM | POA: Diagnosis not present

## 2022-08-19 DIAGNOSIS — J449 Chronic obstructive pulmonary disease, unspecified: Secondary | ICD-10-CM | POA: Diagnosis not present

## 2022-08-19 DIAGNOSIS — Z794 Long term (current) use of insulin: Secondary | ICD-10-CM | POA: Diagnosis not present

## 2022-08-19 DIAGNOSIS — E1165 Type 2 diabetes mellitus with hyperglycemia: Secondary | ICD-10-CM | POA: Diagnosis not present

## 2022-08-19 DIAGNOSIS — E1169 Type 2 diabetes mellitus with other specified complication: Secondary | ICD-10-CM

## 2022-08-19 DIAGNOSIS — C8580 Other specified types of non-Hodgkin lymphoma, unspecified site: Secondary | ICD-10-CM

## 2022-08-19 NOTE — Progress Notes (Signed)
Established Patient Office Visit  Subjective:  Patient ID: Natasha [name], female    DOB: [birth year]  Age: 76 y.o. MRN: [id number]  CC: No chief complaint on file.    HPI  Natasha [name] presents for hospital follow up. She is on 4 L of home oxygen.  She would like to have a air moisturizer for the oxygen concentrator.  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   CHF (congestive heart failure) (HCC)    COPD (chronic obstructive pulmonary disease) ([place])    Diabetes mellitus without complication (HCC)    Hypotension    Pleural effusion    ARMc 844m,  2 weeks ago   Vaginal delivery    x 5    Past Surgical History:  Procedure Laterality Date   IR IMAGING GUIDED PORT INSERTION  08/16/2022   TONSILLECTOMY Bilateral    as a child    Family History  Problem Relation Age of Onset   Breast cancer Mother    Alzheimer's disease Father    Lung cancer Father        lung   Varicose Veins Brother    Heart attack Brother     Social History   Socioeconomic History   Marital status: Widowed    Spouse name: Not on file   Number of children: 2   Years of education: Not on file   Highest education level: 9th grade  Occupational History   Occupation: unemployed  Tobacco Use   Smoking status: Former    Packs/day: 2.00    Years: 55.00    Total pack years: 110.00    Types: Cigarettes    Start date: 07/07/1961    Quit date: 08/02/2022    Years since quitting: 0.0   Smokeless tobacco: Never   Tobacco comments:    1/2 pack she states but family says 2 PPD  Vaping Use   Vaping Use: Never used  Substance and Sexual Activity   Alcohol use: No   Drug use: Never   Sexual activity: Not Currently    Partners: Male    Birth control/protection: Post-menopausal  Other Topics Concern   Not on file  Social History Narrative   08/14/20   [place]  Living: to be near daughter   Work: retired       FPhysiological scientistchildren - JIT consultant(nearby) and son JEvelena Peat[place]in [place]  8 grandchildren, & 2 great-grandchildren.      Enjoys: stays at home      Exercise: walking to her daughter's store   Diet: eats fruit, air fried chicken      Safety   Seat belts: Yes    Guns: No   Safe in relationships: Yes    Social Determinants of Health   Financial Resource Strain: Medium Risk (08/24/2022)   Overall Financial Resource Strain (CARDIA)    Difficulty of Paying Living Expenses: Somewhat hard  Food Insecurity: Food Insecurity Present (08/24/2022)   Hunger Vital Sign    Worried About Running Out of Food in the Last Year: Sometimes true    Ran Out of Food in the Last Year: Sometimes true  Transportation Needs: No Transportation Needs (08/24/2022)   PRAPARE - THydrologist(Medical): No    Lack of Transportation (Non-Medical): No  Physical Activity: Inactive (08/24/2022)   Exercise Vital Sign  Days of Exercise per Week: 0 days    Minutes of Exercise per Session: 0 min  Stress: Stress Concern Present (08/24/2022)   [place]    Feeling of Stress : To some extent  Social Connections: Socially Isolated (08/24/2022)   Social Connection and Isolation Panel [NHANES]    Frequency of Communication with Friends and Family: More than three times a week    Frequency of Social Gatherings with Friends and Family: More than three times a week    Attends Religious Services: Never    Marine scientist or Organizations: No    Attends Archivist Meetings: Never    Marital Status: Widowed  Intimate Partner Violence: Not At Risk (08/16/2022)   Humiliation, Afraid, Rape, and Kick questionnaire    Fear of Current or Ex-Partner: No    Emotionally Abused: No    Physically Abused: No    Sexually Abused: No     Outpatient Medications Prior to Visit  Medication Sig Dispense Refill   acyclovir (ZOVIRAX) 400 MG  tablet Take 400 mg by mouth daily.     albuterol (VENTOLIN HFA) 108 (90 Base) MCG/ACT inhaler TAKE 2 PUFFS BY MOUTH EVERY 6 HOURS AS NEEDED FOR WHEEZE OR SHORTNESS OF BREATH 18 g 1   HYDROcodone-acetaminophen (NORCO) 5-325 MG tablet Take 1 tablet by mouth every 8 (eight) hours as needed for moderate pain. 15 tablet 0   insulin NPH-regular Human (70-30) 100 UNIT/ML injection Inject 5 Units into the skin 2 (two) times daily with a meal. Use only for hyperglycemia, CBG+ 300. Start with 5 units, titrate up to max 10 unit per dose if need. 10 mL 0   insulin regular (NOVOLIN R) 100 units/mL injection Inject 5-10 Units into the skin 3 (three) times daily before meals. BG 200-299 give 5 units BG 300-399 give 10 units.     Ipratropium-Albuterol (COMBIVENT RESPIMAT) 20-100 MCG/ACT AERS respimat Inhale 1 puff into the lungs every 6 (six) hours.     levothyroxine (SYNTHROID) 50 MCG tablet Take 1 tablet (50 mcg total) by mouth daily at 6 (six) AM. 30 tablet 0   lidocaine-prilocaine (EMLA) cream Apply to affected area once (Patient taking differently: 1 Application. Apply to mediport site 2 hours before infusion) 30 g 3   loperamide (IMODIUM) 2 MG capsule Take 2 mg by mouth as needed for diarrhea or loose stools.     ondansetron (ZOFRAN) 8 MG tablet Take 8 mg by mouth every 8 (eight) hours as needed for nausea, vomiting or refractory nausea / vomiting. Take 1 tablet q6H for 4 days after chemotherapy.     predniSONE (DELTASONE) 20 MG tablet Take 1 tablet (20 mg total) by mouth daily for 4 days. 4 tablet 0   prochlorperazine (COMPAZINE) 10 MG tablet Take 1 tablet by mouth every 6 (six) hours as needed for nausea or vomiting. Take 1 tab q6H for 4 days after chemotherapy.     simethicone (MYLICON) 80 MG chewable tablet Chew 180 mg by mouth every 6 (six) hours as needed for flatulence. Take 1 capsule PO PRN gas relief.     Insulin Syringe-Needle U-100 31G X 15/64" 0.3 ML MISC Use to inject insulin twice a day with meals.  90 each 0   No facility-administered medications prior to visit.    No Known Allergies  ROS Review of Systems  Constitutional: Negative.   HENT: Negative.    Eyes: Negative.   Respiratory:  Negative  for apnea and shortness of breath.   Cardiovascular:  Positive for leg swelling.  Gastrointestinal: Negative.   Genitourinary: Negative.   Musculoskeletal: Negative.   Skin: Negative.   Neurological: Negative.   Psychiatric/Behavioral: Negative.        Objective:    Physical Exam Constitutional:      Appearance: Normal appearance. She is normal weight.     Comments: 4L via nasal cannula  HENT:     Head: Normocephalic.     Right Ear: Tympanic membrane normal.     Left Ear: Tympanic membrane normal.     Nose: Nose normal.     Mouth/Throat:     Mouth: Mucous membranes are moist.     Pharynx: Oropharynx is clear.  Eyes:     Extraocular Movements: Extraocular movements intact.     Conjunctiva/sclera: Conjunctivae normal.     Pupils: Pupils are equal, round, and reactive to light.  Cardiovascular:     Rate and Rhythm: Normal rate and regular rhythm.     Pulses: Normal pulses.     Heart sounds: Normal heart sounds.  Pulmonary:     Effort: Pulmonary effort is normal. No respiratory distress.     Breath sounds: Normal breath sounds. No rhonchi.  Abdominal:     General: Bowel sounds are normal.     Palpations: Abdomen is soft. There is no mass.     Tenderness: There is no abdominal tenderness.     Hernia: No hernia is present.  Musculoskeletal:        General: Normal range of motion.     Cervical back: Neck supple. No tenderness.  Skin:    General: Skin is warm.     Capillary Refill: Capillary refill takes less than 2 seconds.  Neurological:     General: No focal deficit present.     Mental Status: She is alert and oriented to person, place, and time. Mental status is at baseline.  Psychiatric:        Mood and Affect: Mood normal.        Behavior: Behavior normal.         Thought Content: Thought content normal.        Judgment: Judgment normal.     There were no vitals taken for this visit. Wt Readings from Last 3 Encounters:  08/23/22 140 lb (63.5 kg)  08/16/22 140 lb (63.5 kg)  08/16/22 157 lb 4 oz (71.3 kg)     Health Maintenance  Topic Date Due   Medicare Annual Wellness (AWV)  Never done   COVID-19 Vaccine (1) Never done   COLONOSCOPY (Pts 45-81yr Insurance coverage will need to be confirmed)  Never done   DEXA SCAN  Never done   MAMMOGRAM  09/20/2020   OPHTHALMOLOGY EXAM  01/20/2021   FOOT EXAM  08/21/2022   Zoster Vaccines- Shingrix (1 of 2) 10/22/2022 (Originally 12/31/1966)   HEMOGLOBIN A1C  12/18/2022   Diabetic kidney evaluation - Urine ACR  04/10/2023   Lung Cancer Screening  07/13/2023   Diabetic kidney evaluation - GFR measurement  08/17/2023   TETANUS/TDAP  07/07/2031   Pneumonia Vaccine 76 Years old  Completed   INFLUENZA VACCINE  Completed   Hepatitis C Screening  Completed   HPV VACCINES  Aged Out    There are no preventive care reminders to display for this patient.  Lab Results  Component Value Date   TSH 6.064 (H) 07/28/2022   Lab Results  Component Value Date   WBC 5.6  08/16/2022   HGB 7.2 (L) 08/16/2022   HCT 24.6 (L) 08/16/2022   MCV 93.5 08/16/2022   PLT 108 (L) 08/16/2022   Lab Results  Component Value Date   NA 138 08/16/2022   K 4.2 08/16/2022   CO2 29 08/16/2022   GLUCOSE 132 (H) 08/16/2022   BUN 27 (H) 08/16/2022   CREATININE 0.81 08/16/2022   BILITOT 1.7 (H) 08/04/2022   ALKPHOS 117 08/04/2022   AST 22 08/04/2022   ALT 10 08/04/2022   PROT 4.7 (L) 08/04/2022   ALBUMIN 2.7 (L) 08/04/2022   CALCIUM 9.3 08/16/2022   ANIONGAP 6 08/16/2022   EGFR 61 06/22/2022   Lab Results  Component Value Date   CHOL 97 (L) 04/09/2022   Lab Results  Component Value Date   HDL 14 (L) 04/09/2022   Lab Results  Component Value Date   LDLCALC 61 04/09/2022   Lab Results  Component Value Date    TRIG 119 04/09/2022   Lab Results  Component Value Date   CHOLHDL 4.8 07/07/2018   Lab Results  Component Value Date   HGBA1C 5.5 06/17/2022      Assessment & Plan:   Problem List Items Addressed This Visit       Respiratory   Chronic obstructive pulmonary disease (COPD) ([place]) - Primary    Stable on 4 L of oxygen by nasal cannula. Continue the current medication.       Relevant Orders   For home use only DME Other see comment     Endocrine   Diabetes mellitus, type II ([place])    Advised pt to check the BS regularly. Advised pt to monitor diet. Advised pt to eat variety of food including fruits, vegetables, whole grains, complex carbohydrates and proteins.. Continue the current medication.        Other   Lymphoma ([place])    Followed by oncologist Dr. Janese Banks        No orders of the defined types were placed in this encounter.    Follow-up: No follow-ups on file.    [name], NP

## 2022-08-19 NOTE — Progress Notes (Signed)
[facility] Clinical Social Work  Clinical Social Work was referred by medical provider for assessment of psychosocial needs.  Clinical Social Worker attempted to contact patient by phone  to offer support and assess for needs.  CSW left voicemail with contact information and request for return call.   FA   Zaxton Angerer, LCSW  Clinical Social Worker [facility]          

## 2022-08-19 NOTE — Telephone Encounter (Addendum)
Incoming message from [name] stating that he wants a different home care agency and that he wants Dr Janese Banks to order it.

## 2022-08-20 ENCOUNTER — Encounter: Payer: Self-pay | Admitting: *Deleted

## 2022-08-20 ENCOUNTER — Telehealth: Payer: Self-pay

## 2022-08-20 ENCOUNTER — Ambulatory Visit: Payer: Self-pay | Admitting: *Deleted

## 2022-08-20 DIAGNOSIS — D696 Thrombocytopenia, unspecified: Secondary | ICD-10-CM | POA: Diagnosis not present

## 2022-08-20 DIAGNOSIS — Z794 Long term (current) use of insulin: Secondary | ICD-10-CM | POA: Diagnosis not present

## 2022-08-20 DIAGNOSIS — J449 Chronic obstructive pulmonary disease, unspecified: Secondary | ICD-10-CM | POA: Diagnosis not present

## 2022-08-20 DIAGNOSIS — Z9981 Dependence on supplemental oxygen: Secondary | ICD-10-CM | POA: Diagnosis not present

## 2022-08-20 DIAGNOSIS — Z87891 Personal history of nicotine dependence: Secondary | ICD-10-CM | POA: Diagnosis not present

## 2022-08-20 DIAGNOSIS — Z8616 Personal history of COVID-19: Secondary | ICD-10-CM | POA: Diagnosis not present

## 2022-08-20 DIAGNOSIS — J9621 Acute and chronic respiratory failure with hypoxia: Secondary | ICD-10-CM | POA: Diagnosis not present

## 2022-08-20 DIAGNOSIS — D631 Anemia in chronic kidney disease: Secondary | ICD-10-CM | POA: Diagnosis not present

## 2022-08-20 DIAGNOSIS — C8599 Non-Hodgkin lymphoma, unspecified, extranodal and solid organ sites: Secondary | ICD-10-CM | POA: Diagnosis not present

## 2022-08-20 DIAGNOSIS — D630 Anemia in neoplastic disease: Secondary | ICD-10-CM | POA: Diagnosis not present

## 2022-08-20 DIAGNOSIS — E1165 Type 2 diabetes mellitus with hyperglycemia: Secondary | ICD-10-CM | POA: Diagnosis not present

## 2022-08-20 DIAGNOSIS — J90 Pleural effusion, not elsewhere classified: Secondary | ICD-10-CM | POA: Diagnosis not present

## 2022-08-20 DIAGNOSIS — R739 Hyperglycemia, unspecified: Secondary | ICD-10-CM | POA: Diagnosis not present

## 2022-08-20 DIAGNOSIS — E1122 Type 2 diabetes mellitus with diabetic chronic kidney disease: Secondary | ICD-10-CM | POA: Diagnosis not present

## 2022-08-20 DIAGNOSIS — Z79891 Long term (current) use of opiate analgesic: Secondary | ICD-10-CM | POA: Diagnosis not present

## 2022-08-20 DIAGNOSIS — C8590 Non-Hodgkin lymphoma, unspecified, unspecified site: Secondary | ICD-10-CM | POA: Diagnosis not present

## 2022-08-20 DIAGNOSIS — M170 Bilateral primary osteoarthritis of knee: Secondary | ICD-10-CM | POA: Diagnosis not present

## 2022-08-20 DIAGNOSIS — B0222 Postherpetic trigeminal neuralgia: Secondary | ICD-10-CM | POA: Diagnosis not present

## 2022-08-20 DIAGNOSIS — Z9181 History of falling: Secondary | ICD-10-CM | POA: Diagnosis not present

## 2022-08-20 DIAGNOSIS — I5033 Acute on chronic diastolic (congestive) heart failure: Secondary | ICD-10-CM | POA: Diagnosis not present

## 2022-08-20 DIAGNOSIS — Z7952 Long term (current) use of systemic steroids: Secondary | ICD-10-CM | POA: Diagnosis not present

## 2022-08-20 DIAGNOSIS — N1831 Chronic kidney disease, stage 3a: Secondary | ICD-10-CM | POA: Diagnosis not present

## 2022-08-20 DIAGNOSIS — E039 Hypothyroidism, unspecified: Secondary | ICD-10-CM | POA: Diagnosis not present

## 2022-08-20 NOTE — Patient Outreach (Signed)
  Care Coordination   Follow Up Visit Note   08/20/2022 Name: Orchid Glassberg Linck MRN: [id number] DOB: 12/26/1945  Prentice Docker Mullett is a 76 y.o. year old female who sees [name], NP for primary care. I  spoke with [name]  What matters to the patients health and wellness today?   Telephone call to [name] / caregiver to inform him that [facility] care and Hospice states they are closed for home health services to the [facility] area.  [name] informed that [facility] will research home care providers for the [employer] area.    Goals Addressed             This Visit's Progress    Patient / caregiver stated:  Management of health conditions       Care Coordination Interventions: [facility] contacted to determine if they provide service to patients coverage area and process for transferring home health service.  Researched home health providers for [employer] area [facility], [place], [facility])            SDOH assessments and interventions completed:  No     Care Coordination Interventions Activated:  Yes  Care Coordination Interventions:  Yes, provided   Follow up plan:  as previously scheduled    Encounter Outcome:  Pt. Visit Completed

## 2022-08-20 NOTE — Patient Outreach (Signed)
  Care Coordination   Follow Up Visit Note   08/20/2022 Name: Natasha [name] MRN: [id number] DOB: January 29, 1946  Natasha [name] is a 76 y.o. year old female who sees [name], NP for primary care. I  spoke with Natasha [name].  What matters to the patients health and wellness today?  Returned telephone call to Natasha [name], caregiver for patient.  Natasha states he would like to know how to change patients home health agency from [place] to  [facility] care.  He states [facility] does not provide a home health aid or LCSW and he is having issues with securing services for patient.  RNCM explained to Natasha a new order would have to be obtained from patients primary care provider.  Natasha explained he has to end call with [facility] for now and request return call later today.  RNCM called [facility] and spoke with Venita. She states [facility] is closed to the [employer] area for home health services.     Goals Addressed             This Visit's Progress    Patient / caregiver stated:  Management of health conditions       Care Coordination Interventions: [facility] home [facility] care contacted to determine if they provide service to patients coverage area and process for transferring home health service.            SDOH assessments and interventions completed:  No     Care Coordination Interventions Activated:  Yes  Care Coordination Interventions:  Yes, provided   Follow up plan:  will attempt return call to patients caregiver Natasha today    Encounter Outcome:  Pt. Visit Completed

## 2022-08-23 ENCOUNTER — Other Ambulatory Visit: Payer: Self-pay | Admitting: *Deleted

## 2022-08-23 ENCOUNTER — Encounter: Payer: Self-pay | Admitting: Oncology

## 2022-08-23 ENCOUNTER — Telehealth: Payer: Self-pay

## 2022-08-23 ENCOUNTER — Other Ambulatory Visit: Payer: Self-pay | Admitting: Hospice and Palliative Medicine

## 2022-08-23 ENCOUNTER — Other Ambulatory Visit: Payer: Self-pay

## 2022-08-23 ENCOUNTER — Emergency Department
Admission: EM | Admit: 2022-08-23 | Discharge: 2022-08-23 | Disposition: A | Discharge status: Home / Self Care | Payer: Medicare Other | Attending: Emergency Medicine | Admitting: Emergency Medicine

## 2022-08-23 DIAGNOSIS — E119 Type 2 diabetes mellitus without complications: Secondary | ICD-10-CM | POA: Insufficient documentation

## 2022-08-23 DIAGNOSIS — Z9981 Dependence on supplemental oxygen: Secondary | ICD-10-CM | POA: Diagnosis not present

## 2022-08-23 DIAGNOSIS — I499 Cardiac arrhythmia, unspecified: Secondary | ICD-10-CM | POA: Diagnosis not present

## 2022-08-23 DIAGNOSIS — R069 Unspecified abnormalities of breathing: Secondary | ICD-10-CM | POA: Diagnosis not present

## 2022-08-23 DIAGNOSIS — I509 Heart failure, unspecified: Secondary | ICD-10-CM | POA: Insufficient documentation

## 2022-08-23 DIAGNOSIS — Z794 Long term (current) use of insulin: Secondary | ICD-10-CM | POA: Diagnosis not present

## 2022-08-23 DIAGNOSIS — J449 Chronic obstructive pulmonary disease, unspecified: Secondary | ICD-10-CM | POA: Insufficient documentation

## 2022-08-23 DIAGNOSIS — Z85828 Personal history of other malignant neoplasm of skin: Secondary | ICD-10-CM | POA: Insufficient documentation

## 2022-08-23 DIAGNOSIS — R0602 Shortness of breath: Secondary | ICD-10-CM | POA: Insufficient documentation

## 2022-08-23 DIAGNOSIS — J9611 Chronic respiratory failure with hypoxia: Secondary | ICD-10-CM | POA: Diagnosis not present

## 2022-08-23 DIAGNOSIS — R0689 Other abnormalities of breathing: Secondary | ICD-10-CM | POA: Diagnosis not present

## 2022-08-23 DIAGNOSIS — Z743 Need for continuous supervision: Secondary | ICD-10-CM | POA: Diagnosis not present

## 2022-08-23 DIAGNOSIS — C8580 Other specified types of non-Hodgkin lymphoma, unspecified site: Secondary | ICD-10-CM

## 2022-08-23 DIAGNOSIS — R6889 Other general symptoms and signs: Secondary | ICD-10-CM | POA: Diagnosis not present

## 2022-08-23 LAB — CBG MONITORING, ED: Glucose-Capillary: 154 mg/dL — ABNORMAL HIGH (ref 70–99)

## 2022-08-23 MED ORDER — LEVOTHYROXINE SODIUM 50 MCG PO TABS
50.0000 ug | ORAL_TABLET | Freq: Every day | ORAL | Status: DC
  Administered 2022-08-23: 50 ug via ORAL
  Filled 2022-08-23: qty 1

## 2022-08-23 MED ORDER — IPRATROPIUM-ALBUTEROL 0.5-2.5 (3) MG/3ML IN SOLN
3.0000 mL | Freq: Four times a day (QID) | RESPIRATORY_TRACT | Status: DC
  Administered 2022-08-23: 3 mL via RESPIRATORY_TRACT
  Filled 2022-08-23: qty 3

## 2022-08-23 MED ORDER — INSULIN ASPART PROT & ASPART (70-30 MIX) 100 UNIT/ML ~~LOC~~ SUSP: 5.0000 [IU] | Freq: Two times a day (BID) | SUBCUTANEOUS | Status: DC

## 2022-08-23 MED ORDER — ALBUTEROL SULFATE (2.5 MG/3ML) 0.083% IN NEBU
3.0000 mL | INHALATION_SOLUTION | RESPIRATORY_TRACT | Status: DC | PRN
  Administered 2022-08-23: 3 mL via RESPIRATORY_TRACT
  Filled 2022-08-23: qty 3

## 2022-08-23 MED ORDER — INSULIN REGULAR HUMAN 100 UNIT/ML IJ SOLN: 5.0000 [IU] | Freq: Three times a day (TID) | INTRAMUSCULAR | Status: DC

## 2022-08-23 NOTE — ED Notes (Signed)
Pt moved to room [place].
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
## 2022-08-23 NOTE — Patient Outreach (Signed)
  Care Coordination   Follow Up Visit Note   08/23/2022 Name: Virgen Belland Wagenaar MRN: [id number] DOB: 11-16-1945  Prentice Docker Maack is a 76 y.o. year old female who sees [name], NP for primary care. I  spoke with [name] / caregiver today.   What matters to the patients health and wellness today?  Call received from caregiver to provide update. He states he spoke with [facility] and they will send the nurse out to the home on 08/25/22 to discharge patient from services as requested. He states he spoke with social worker to discuss having ED physician order patient home health services with new provider.    Goals Addressed             This Visit's Progress    Patient / caregiver stated:  Management of health conditions       Care Coordination Interventions: Message sent to primary care provider [name], NP informing patient/ caregiver are requesting new home health agency for patient due to concerns with current agency.             SDOH assessments and interventions completed:  No     Care Coordination Interventions Activated:  No  Care Coordination Interventions:  No, not indicated   Follow up plan: Follow up call scheduled for as previously scheduled    Encounter Outcome:  Pt. Visit Completed   Quinn Plowman RN,BSN,CCM [address] [phone] direct line

## 2022-08-23 NOTE — TOC Initial Note (Signed)
Transition of Care [facility]) - Initial/Assessment Note    Patient Details  Name: Natasha [name] MRN: [id number] Date of Birth: Aug 09, 1946  Transition of [facility]) CM/SW Contact:    Shelbie Hutching, RN Phone Number: 08/23/2022, 11:41 AM  Clinical Narrative:                 Patient brought to the emergency room for shortness of breath and running out of oxygen at home.  Patient recently discharged on 10/24.  RNCM met with patient at the bedside in the emergency room, patient will be discharged back home from the ED.  Patient reports that she has run out of oxygen tanks at home, they are all empty, asked about her concentrator and she reports that it is not working.  She reports that there is something wrong with a green cord.  RNCM spoke with Thedore Mins at [facility], he will have an oxygen tank brought to the bedside for patient to go home with and schedule a home visit to check her concentrator.  Patient has a caregiver that lives with her, [name], he is unable to pick her up, no transportation.  Once oxygen has been delivered to patient in ED, RNCM will arrange Safe Transport to get patient home.    Barriers to Discharge: Equipment Delay   Patient Goals and CMS Choice Patient states their goals for this hospitalization and ongoing recovery are:: to get her oxygen fixed at home CMS Medicare.gov Compare Post Acute Care list provided to:: Patient Choice offered to / list presented to : Patient  Expected Discharge Plan and Services                           DME Arranged: Oxygen DME Agency: [employer] Date DME Agency Contacted: 08/23/22 Time DME Agency Contacted: 16 Representative spoke with at DME Agency: Thedore Mins HH Arranged: RN, PT, OT [facility] Agency: [place] Date [place]: 08/23/22 Time HH Agency Contacted: 0920 Representative spoke with at [place]: Sharmon Revere  Prior Living Arrangements/Services                       Activities of Daily Living       Permission Sought/Granted                  Emotional Assessment              Admission diagnosis:  SOB Patient Active Problem List   Diagnosis Date Noted   Acute on chronic respiratory failure with hypoxemia ([place]) 08/16/2022   Acute on chronic anemia 08/02/2022   CHF exacerbation ([place]) 08/02/2022   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   Swelling of lower extremity 07/24/2022   Hypotension 07/24/2022   Goals of care, counseling/discussion    COVID-19 virus infection 07/14/2022   Acute on chronic diastolic CHF (congestive heart failure) ([place]) 07/14/2022   Anemia of chronic disease 07/14/2022   Thrombocytopenia ([place]) 07/14/2022   Hypothyroidism 07/14/2022   Chronic kidney disease, stage 3a ([place]) 07/14/2022   Bilateral pleural effusion 07/12/2022   Diabetes mellitus, type II ([place]) 07/12/2022   Acute on chronic respiratory failure with hypoxia ([place]) 07/12/2022   Recurrent pleural effusion 05/12/2022   Lower extremity edema 05/12/2022   Pancytopenia ([place]) 04/16/2022   Type 2 diabetes mellitus with proteinuria ([place]) 04/09/2022   Atherosclerosis of aorta ([place]) 04/09/2022   Chronic obstructive pulmonary disease (COPD) ([place]) 04/09/2022   SOB (shortness of  breath) on exertion 04/09/2022   Acute cough 04/09/2022   Cigarette nicotine dependence, uncomplicated 76/80/8811   Post herpetic neuralgia 03/14/2020   Gall stones 10/23/2019   Lymphoma ([place]) 10/23/2019   Generalized lymphadenopathy 09/08/2018   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   Primary osteoarthritis of both knees 07/07/2018   Senile purpura ([place]) 07/07/2018   PCP:  [name], NP Pharmacy:   [facility], [address] [place] [address] Phone: [phone] Fax: [phone]     Social Determinants of Health (SDOH) Interventions    Readmission Risk Interventions    08/17/2022   11:05 AM 08/04/2022    2:54  PM  Readmission Risk Prevention Plan  Transportation Screening Complete Complete  PCP or Specialist Appt within 5-7 Days  Complete  PCP or Specialist Appt within 3-5 Days Complete   Home Care Screening  Complete  Medication Review (RN CM)  Complete  HRI or Home Care Consult Complete   Social Work Consult for [place] Planning/Counseling Complete   Palliative Care Screening Not Applicable   Medication Review Press photographer) Complete

## 2022-08-23 NOTE — TOC Progression Note (Signed)
Transition of Care [facility]) - Progression Note    Patient Details  Name: Natasha [name] MRN: [id number] Date of Birth: 1946/04/05  Transition of Care [facility]) CM/SW Contact  Shelbie Hutching, RN Phone Number: 08/23/2022, 12:49 PM  Clinical Narrative:    Oxygen delivered to patient in ED.  Safe Transport has been called for pick up, they will call the ED secretary when they arrive.      Barriers to Discharge: Equipment Delay  Expected Discharge Plan and Services                           DME Arranged: Oxygen DME Agency: [employer] Date DME Agency Contacted: 08/23/22 Time DME Agency Contacted: 73 Representative spoke with at DME Agency: Thedore Mins HH Arranged: RN, PT, OT [facility] Agency: [place] Date [place]: 08/23/22 Time [place]: 0920 Representative spoke with at [place]: [place] ([place]) Interventions    Readmission Risk Interventions    08/17/2022   11:05 AM 08/04/2022    2:54 PM  Readmission Risk Prevention Plan  Transportation Screening Complete Complete  PCP or Specialist Appt within 5-7 Days  Complete  PCP or Specialist Appt within 3-5 Days Complete   Home Care Screening  Complete  Medication Review (RN CM)  Complete  HRI or Home Care Consult Complete   Social Work Consult for [place] Planning/Counseling Complete   Palliative Care Screening Not Applicable   Medication Review Press photographer) Complete

## 2022-08-23 NOTE — ED Provider Notes (Signed)
[facility] Provider Note    Event Date/Time   First MD Initiated Contact with Patient 08/23/22 0122     (approximate)   History   Shortness of Breath   HPI  Natasha [name] is a 76 y.o. female history of CHF, COPD, diabetes who presents to the emergency department with shortness of breath after she ran out of oxygen at home.  Patient is back on her nasal cannula and reports she is feeling better and has no complaints.  No current shortness of breath, chest pain, cough, fever.   History provided by patient and EMS.    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 Authorizing Provider  acyclovir (ZOVIRAX) 400 MG tablet Take 400 mg by mouth daily. 08/09/22   [provider]  albuterol (VENTOLIN HFA) 108 (90 Base) MCG/ACT inhaler TAKE 2 PUFFS BY MOUTH EVERY 6 HOURS AS NEEDED FOR WHEEZE OR SHORTNESS OF BREATH 06/17/22   SRalene MuskratB, MD  HYDROcodone-acetaminophen (NORCO) 5-325 MG tablet Take 1 tablet by mouth every 8 (eight) hours as needed for moderate pain. 08/17/22   PFritzi Mandes MD  insulin NPH-regular Human (70-30) 100 UNIT/ML injection Inject 5 Units into the skin 2 (two) times daily with a meal. Use only for hyperglycemia, CBG+ 300. Start with 5 units, titrate up to max 10 unit per dose if need. 08/17/22   PFritzi Mandes MD  insulin regular (NOVOLIN R) 100 units/mL injection Inject 5-10 Units into the skin  3 (three) times daily before meals. BG 200-299 give 5 units BG 300-399 give 10 units.    [provider]  Insulin Syringe-Needle U-100 31G X 15/64" 0.3 ML MISC Use to inject insulin twice a day with meals. 07/17/22   Karamalegos, ADevonne Doughty DO  Ipratropium-Albuterol (COMBIVENT RESPIMAT) 20-100 MCG/ACT AERS respimat Inhale 1 puff into the lungs every 6 (six) hours.    [provider]  levothyroxine (SYNTHROID) 50 MCG tablet Take 1 tablet (50 mcg total) by mouth daily at 6 (six) AM. 07/17/22   ZSharen Hones MD  lidocaine-prilocaine (EMLA) cream Apply to affected area once Patient taking differently: 1 Application. Apply to mediport site 2 hours before infusion 07/28/22   RSindy Guadeloupe MD  loperamide (IMODIUM) 2 MG capsule Take 2 mg by mouth as needed for diarrhea or loose stools.    [provider]  ondansetron (ZOFRAN) 8 MG tablet Take 8 mg by mouth every 8 (eight) hours as needed for nausea, vomiting or refractory nausea / vomiting. Take 1 tablet q6H for 4 days after chemotherapy. 08/09/22   [provider]  prochlorperazine (COMPAZINE) 10 MG tablet Take 1 tablet by mouth every 6 (six) hours as needed for nausea or vomiting. Take 1 tab q6H for 4 days after chemotherapy. 08/09/22   [provider]  simethicone (MYLICON) 80 MG chewable tablet Chew 180 mg by mouth every 6 (six) hours as needed for flatulence. Take 1 capsule PO PRN gas relief.    [provider]    Physical Exam   Triage Vital Signs: ED Triage Vitals  Enc Vitals Group     BP 08/23/22 0111 (!) 97/57     Pulse Rate 08/23/22 0111 84     Resp 08/23/22 0111 18     Temp 08/23/22 0111 98.4 F (36.9 C)     Temp Source 08/23/22 0111 Oral     SpO2 08/23/22 0111 99 %     Weight 08/23/22 0112 140 lb (63.5 kg)     Height 08/23/22 0112 '5\' 5"'$  (1.651 m)     Head Circumference --      Peak Flow --      Pain Score 08/23/22 0112 0     Pain Loc --      Pain Edu? --      Excl. in [place]? --      Most recent vital signs: Vitals:   08/23/22 0230 08/23/22 0236  BP: (!) 101/56   Pulse: 84   Resp:    Temp: 98.3 F (36.8 C) 98.3 F (36.8 C)  SpO2: 98%     CONSTITUTIONAL: Alert and oriented and responds appropriately to questions.  Elderly, chronically ill-appearing HEAD: Normocephalic, atraumatic EYES: Conjunctivae clear, pupils appear equal, sclera nonicteric ENT: normal nose; moist mucous membranes NECK: Supple, normal ROM CARD: RRR; S1 and S2 appreciated; no murmurs, no clicks, no rubs, no gallops RESP: Normal chest excursion without splinting or tachypnea; breath sounds clear and equal bilaterally; no wheezes, no rhonchi, no rales, no hypoxia or respiratory distress, speaking full sentences ABD/GI: Normal bowel sounds; non-distended; soft, non-tender, no rebound, no guarding, no peritoneal signs BACK: The back appears normal EXT: Normal ROM in all joints; no deformity noted, no edema; no cyanosis, no calf tenderness or calf swelling SKIN: Normal color for age and race; warm; no rash on exposed skin NEURO: Moves all extremities equally, normal speech PSYCH: The patient's mood and manner are appropriate.   ED Results / Procedures / Treatments   LABS: (all labs ordered are listed, but only abnormal results are displayed) Labs Reviewed - No data to display   EKG:  RADIOLOGY: My personal review and interpretation of imaging:    I have personally reviewed all radiology reports.   No results found.   PROCEDURES:  Critical Care performed: No   Procedures    IMPRESSION / MDM / ASSESSMENT AND PLAN / ED COURSE  I reviewed the triage vital signs and the nursing notes.    Patient here with shortness of breath at home as she ran out of her oxygen.  Was found to be hypoxic on room air but now doing well on her normal nasal cannula without any complaints.     DIFFERENTIAL DIAGNOSIS (includes but not limited to):   Patient hypoxic and short of breath due to  running out of oxygen.  Back to her baseline now.  Doubt COPD, CHF exacerbations, PE, pneumothorax, pneumonia.   Patient's presentation is most consistent with acute, uncomplicated illness.   PLAN: No further work-up needed at this time.  Patient just needs to see social work in the morning to help her get oxygen for home.  Her home medications have been ordered.  She has no acute complaints at this time.   MEDICATIONS GIVEN IN ED: Medications  albuterol (PROVENTIL) (2.5 MG/3ML)  0.083% nebulizer solution 3 mL (3 mLs Inhalation Given 08/23/22 0232)  insulin aspart protamine- aspart (NOVOLOG MIX 70/30) injection 5 Units (has no administration in time range)  insulin regular (NOVOLIN R) 100 units/mL injection 5-10 Units (has no administration in time range)  ipratropium-albuterol (DUONEB) 0.5-2.5 (3) MG/3ML nebulizer solution 3 mL (has no administration in time range)  levothyroxine (SYNTHROID) tablet 50 mcg (has no administration in time range)     ED COURSE: Patient to be discharged home after we are able to obtain oxygen for her for home.   CONSULTS: Social work consulted.   OUTSIDE RECORDS REVIEWED: Reviewed patient's last internal medicine visit with [facility], NP on 08/19/2022.       FINAL CLINICAL IMPRESSION(S) / ED DIAGNOSES   Final diagnoses:  Dependence on continuous supplemental oxygen     Rx / DC Orders   ED Discharge Orders     None        Note:  This document was prepared using Dragon voice recognition software and may include unintentional dictation errors.   Stargell, Delice Bison, DO 08/23/22 [phone]

## 2022-08-23 NOTE — Patient Outreach (Unsigned)
  Care Coordination   Follow Up Visit Note   08/23/2022 Name: Shaun Zuccaro Waterworth MRN: [id number] DOB: 1946/10/09  Prentice Docker [place] is a 76 y.o. year old female who sees [name], NP for primary care. I  spoke with [name]/ caregiver  What matters to the patients health and wellness today?  Call received from caregiver stating the oxygen tank patient came home with from the emergency room was low and currently almost in the red.  He reports patient is on 4 L of oxygen.  He states he requested that patient not be sent home from the emergency room until he could confirm delivery of her oxygen.  Caregiver states patient arrived home at 2:15 pm prior to delivery of oxygen.  Caregiver states he has not heard from [facility] to verify when patients oxygen will be delivered.  Caregiver voiced frustration and states he will have to take patient back to the emergency room because she will run out of oxygen soon.  RNCM offered to intervene and call [facility] to assist with securing oxygen for patient.   [employer] contacted. Spoke with Caryl Asp and Janett Billow. Explained that patient recently discharged from the emergency room with oxygen tank.  Explained oxygen tank is low almost on red per caregiver.  Requested assistance with arranging emergent delivery of oxygen.  Janett Billow explained that patient is scheduled to have oxygen delivery today but unsure of what time.  She offered to speak with  manager and request delivery driver be called.  Per Janett Billow contact made with delivery driver who is now in route to patients home.  Delivery driver arrived at patients home while [facility] and [facility] on call.   RNCM contacted caregiver to inform him that delivery driver was in the driveway per [place] representative, Janett Billow. Caregiver expressed appreciation.    Goals Addressed             This Visit's Progress    Patient / caregiver stated:  Management of health conditions       Care Coordination  Interventions: Contacted [employer] regarding need for emergent delivery of oxygen tanks.              SDOH assessments and interventions completed:  No{THN Tip this will not be part of the note when signed-REQUIRED REPORT FIELD DO NOT DELETE (Optional):27901}     Care Coordination Interventions Activated:  Yes {THN Tip this will not be part of the note when signed-REQUIRED REPORT FIELD DO NOT DELETE (Optional):27901} Care Coordination Interventions:  Yes, provided {THN Tip this will not be part of the note when signed-REQUIRED REPORT FIELD DO NOT DELETE (Optional):27901}  Follow up plan: Follow up call scheduled for 08/26/2022    Encounter Outcome:  Pt. Visit Completed {THN Tip this will not be part of the note when signed-REQUIRED REPORT FIELD DO NOT DELETE (Optional):27901}  Quinn Plowman RN,BSN,CCM [place] direct line

## 2022-08-23 NOTE — ED Provider Notes (Signed)
Home O2 arranged. Pt otherwise at her medical baseline and denies complaints   Duffy Bruce, MD 08/23/22 [phone]

## 2022-08-23 NOTE — ED Notes (Signed)
Pt provided with portable oxygen to take home by SW. Pt taken to Safe Ride set up by SW.

## 2022-08-23 NOTE — ED Triage Notes (Signed)
Presents via EMS with c/o SOB d/t running out of O2 at home.  Pt and EMS attempted to contact home health who reports pt is not in their system and could not help her.  EMS attempted to notify PCP unsuccessfully.  Pt brought into ER with SaO2 < 76% upon EMS arrival.  Now at ER arrival pt is on 4lpm [place] of O2 with SaO2 of 99%,

## 2022-08-23 NOTE — Patient Outreach (Signed)
  Care Coordination   Follow Up Visit Note   08/23/2022 Name: Natasha [name] MRN: [id number] DOB: 12/26/1945  Natasha [name] is a 76 y.o. year old female who sees [name], NP for primary care. I  spoke with patients caregiver, Natasha [name] today  What matters to the patients health and wellness today?  Return call to patients caregiver Natasha [name].   Natasha states he/ patient would like to have services with [facility] discontinued and for patient to be transferred to [facility]. He states he nor patient have been satisfied with the follow up/ follow through provided and request another agency.  Natasha states patient was running out of her oxygen on yesterday evening.   He states he put in a call to [employer] requesting additional O2 tanks and tubing.  He states he did not hear back. Reports due to patients drop in O2 saturation he took her to the ED.  Natasha states patient still currently in ED and has been in the ED x 12 hours.   Goals Addressed             This Visit's Progress    Patient / caregiver stated:  Management of health conditions       Care Coordination Interventions: Message sent to primary care provider [name], NP informing patient/ caregiver are requesting new home health agency for patient due to concerns with current agency.             SDOH assessments and interventions completed:  No     Care Coordination Interventions Activated:  Yes  Care Coordination Interventions:  Yes, provided   Follow up plan: Follow up call scheduled for as previously scheduled    Encounter Outcome:  Pt. Visit Completed
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
## 2022-08-23 NOTE — Telephone Encounter (Signed)
I did call [name] today and we was very mad. He states that pt was on oxygen and tubing broke and then he got that fixed and she ran out of oxygen and was sent to ER and he told the staff at ER to wait for his call before sending pt. Back home because he was waiting for the oxygen to be delivered before she went home. [name] said the ER did not communicate with [name] because he wanted oxygen in the home first. She came back home and the [employer] had sent her with e tank. While I was on the phone he was saying that he did not think the oxygen was going to last another 10 min. I told him that I will call [employer] and see what I can do. I called Zack and he said it was already suppose to be at her house. He was going to check on it and I gave him chad's number. I called [name] and while I was on phone with him, the delivery was knocking on the door with oxygen . [name] will call me tom.

## 2022-08-24 ENCOUNTER — Inpatient Hospital Stay: Payer: Medicare Other | Admitting: Licensed Clinical Social Worker

## 2022-08-24 ENCOUNTER — Ambulatory Visit: Payer: Medicare Other | Admitting: Internal Medicine

## 2022-08-24 DIAGNOSIS — J449 Chronic obstructive pulmonary disease, unspecified: Secondary | ICD-10-CM | POA: Diagnosis not present

## 2022-08-24 DIAGNOSIS — I5033 Acute on chronic diastolic (congestive) heart failure: Secondary | ICD-10-CM | POA: Diagnosis not present

## 2022-08-24 DIAGNOSIS — Z79891 Long term (current) use of opiate analgesic: Secondary | ICD-10-CM | POA: Diagnosis not present

## 2022-08-24 DIAGNOSIS — C8599 Non-Hodgkin lymphoma, unspecified, extranodal and solid organ sites: Secondary | ICD-10-CM | POA: Diagnosis not present

## 2022-08-24 DIAGNOSIS — Z9181 History of falling: Secondary | ICD-10-CM | POA: Diagnosis not present

## 2022-08-24 DIAGNOSIS — D630 Anemia in neoplastic disease: Secondary | ICD-10-CM | POA: Diagnosis not present

## 2022-08-24 DIAGNOSIS — Z9981 Dependence on supplemental oxygen: Secondary | ICD-10-CM | POA: Diagnosis not present

## 2022-08-24 DIAGNOSIS — N1831 Chronic kidney disease, stage 3a: Secondary | ICD-10-CM | POA: Diagnosis not present

## 2022-08-24 DIAGNOSIS — M170 Bilateral primary osteoarthritis of knee: Secondary | ICD-10-CM | POA: Diagnosis not present

## 2022-08-24 DIAGNOSIS — D631 Anemia in chronic kidney disease: Secondary | ICD-10-CM | POA: Diagnosis not present

## 2022-08-24 DIAGNOSIS — E1122 Type 2 diabetes mellitus with diabetic chronic kidney disease: Secondary | ICD-10-CM | POA: Diagnosis not present

## 2022-08-24 DIAGNOSIS — Z7952 Long term (current) use of systemic steroids: Secondary | ICD-10-CM | POA: Diagnosis not present

## 2022-08-24 DIAGNOSIS — Z87891 Personal history of nicotine dependence: Secondary | ICD-10-CM | POA: Diagnosis not present

## 2022-08-24 DIAGNOSIS — Z8616 Personal history of COVID-19: Secondary | ICD-10-CM | POA: Diagnosis not present

## 2022-08-24 DIAGNOSIS — B0222 Postherpetic trigeminal neuralgia: Secondary | ICD-10-CM | POA: Diagnosis not present

## 2022-08-24 DIAGNOSIS — E039 Hypothyroidism, unspecified: Secondary | ICD-10-CM | POA: Diagnosis not present

## 2022-08-24 DIAGNOSIS — E1165 Type 2 diabetes mellitus with hyperglycemia: Secondary | ICD-10-CM | POA: Diagnosis not present

## 2022-08-24 DIAGNOSIS — Z794 Long term (current) use of insulin: Secondary | ICD-10-CM | POA: Diagnosis not present

## 2022-08-24 DIAGNOSIS — J90 Pleural effusion, not elsewhere classified: Secondary | ICD-10-CM | POA: Diagnosis not present

## 2022-08-24 DIAGNOSIS — D696 Thrombocytopenia, unspecified: Secondary | ICD-10-CM | POA: Diagnosis not present

## 2022-08-24 DIAGNOSIS — C8580 Other specified types of non-Hodgkin lymphoma, unspecified site: Secondary | ICD-10-CM

## 2022-08-24 DIAGNOSIS — J9621 Acute and chronic respiratory failure with hypoxia: Secondary | ICD-10-CM | POA: Diagnosis not present

## 2022-08-24 NOTE — Progress Notes (Signed)
[place] Work  Initial Assessment   Natasha [name] is a 76 y.o. year old female contacted caregiver by phone. Clinical Social Work was referred by medical provider for assessment of psychosocial needs.   SDOH (Social Determinants of Health) assessments performed: Yes SDOH Interventions    Flowsheet Row Clinical Support from 08/24/2022 in [name] from 08/20/2022 in [facility] Coordination from 08/10/2022 in [facility] Coordination Office Visit from 08/25/2021 in [facility] Interventions      Food Insecurity Interventions Other (Comment)  [Adult Scientist, forensic currently investigating financial exploitation] Intervention Not Indicated Other (Comment)  [referred to [employer] guide for food security assistance.] --  Housing Interventions Intervention Not Indicated Intervention Not Indicated Intervention Not Indicated --  Transportation Interventions Intervention Not Indicated, Patient Resources (Friends/Family), [employer]AR Printmaker ([place]. Only) -- Intervention Not Indicated --  Utilities Interventions Intervention Not Indicated Intervention Not Indicated -- --  Alcohol Usage Interventions Intervention Not Indicated (Score <7) -- -- --  Depression Interventions/Treatment  -- -- -- Patient refuses Treatment  Financial Strain Interventions Intervention Not Indicated -- -- --  Physical Activity Interventions Intervention Not Indicated -- -- --  Stress Interventions Intervention Not Indicated -- -- --  Social Connections Interventions Intervention Not Indicated -- -- --       SDOH Screenings   Food Insecurity: Food Insecurity Present (08/24/2022)  Housing: Low Risk  (08/24/2022)  Transportation Needs: No Transportation Needs (08/24/2022)  Utilities: Not At Risk (08/24/2022)  Alcohol Screen: Low Risk  (08/24/2022)   Depression (PHQ2-9): Low Risk  (08/24/2022)  Financial Resource Strain: Medium Risk (08/24/2022)  Physical Activity: Inactive (08/24/2022)  Social Connections: Socially Isolated (08/24/2022)  Stress: Stress Concern Present (08/24/2022)  Tobacco Use: Medium Risk (08/16/2022)     Distress Screen completed: No     No data to display            Family/Social Information:  Housing Arrangement: patient lives with care giver  Hilma Favors [phone]  Family members/support persons in your life? Family, Friends, and Geophysical data processor concerns: no  Employment: Retired and Disabled  .  Income source: [place] concerns:  Patient's monthly income is sufficient, APS currently involved in financial exploitation investigation Type of concern: Food Food access concerns: Patient receives food stamps, Adult Scientist, forensic involved in financial exploitation investigation Religious or spiritual practice: Not known Services Currently in place:  SSI, SS Retirement, food stamps  Coping/ Adjustment to diagnosis: Patient understands treatment plan and what happens next? yes Concerns about diagnosis and/or treatment: I'm not especially worried about anything Patient reported stressors: Food Hopes and/or priorities: N/A Patient enjoys  N/A Current coping skills/ strengths: Supportive family/friends     SUMMARY: Current SDOH Barriers:  Limited access to food, Level of care concerns, ADL IADL limitations, and Family and relationship dysfunction  Clinical Social Work Clinical Goal(s):  Patient will follow up with Adult Scientist, forensic* as directed by SW  Interventions: Discussed common feeling and emotions when being diagnosed with cancer, and the importance of support during treatment Informed patient of the support team roles and support services at [facility] Provided CSW contact information and encouraged patient  to call with any questions or concerns Provided patient with information about CSW role in patient care.     Follow Up Plan: Patient will contact CSW with any support or resource needs Patient verbalizes  understanding of plan: Yes  CSW spoke to caregiver who updated CSW on current APS investigation for financial exploitation.  Adelene Amas, LCSW

## 2022-08-24 NOTE — Assessment & Plan Note (Signed)
Advised pt to check the BS regularly. Advised pt to monitor diet. Advised pt to eat variety of food including fruits, vegetables, whole grains, complex carbohydrates and proteins.. Continue the current medication.

## 2022-08-24 NOTE — Assessment & Plan Note (Signed)
Followed by oncologist Dr. Janese Banks

## 2022-08-24 NOTE — Assessment & Plan Note (Addendum)
Stable on 4 L of oxygen by nasal cannula. Continue the current medication.

## 2022-08-25 ENCOUNTER — Telehealth: Payer: Self-pay

## 2022-08-25 ENCOUNTER — Telehealth: Payer: Self-pay | Admitting: *Deleted

## 2022-08-25 ENCOUNTER — Ambulatory Visit: Payer: Self-pay | Admitting: *Deleted

## 2022-08-25 DIAGNOSIS — N1831 Chronic kidney disease, stage 3a: Secondary | ICD-10-CM | POA: Diagnosis not present

## 2022-08-25 DIAGNOSIS — I5033 Acute on chronic diastolic (congestive) heart failure: Secondary | ICD-10-CM | POA: Diagnosis not present

## 2022-08-25 NOTE — Patient Outreach (Unsigned)
  Care Coordination   Follow Up Visit Note   08/26/2022 Name: Regan Mcbryar Corne MRN: [id number] DOB: 21-Jan-1946  Prentice Docker Hermans is a 76 y.o. year old female who sees [name], NP for primary care. I  spoke with caregiver, [name].  What matters to the patients health and wellness today?  Telephone call to caregiver to confirm patients issues/concerns regarding her oxygen supplies/ tanks resolved.   Caregiver confirms patient has all requested oxygen supplies/ equipment.  Caregiver states on Sunday 08/22/22 patients tubing that attaches to her concentrator wouldn't adhere well due to ware therefore not dispensing oxygen to patient.  Caregiver reports patients oxygen usage is 4L around the clock.  Caregiver states he connected patient to her E-tank and called [employer] to discuss need for new tubing and additional oxygen due to E tank running low.  Caregiver states he was told patient was not listed in their system.  Caregiver states he then called patients home health agency because he was concerned that patient would run out of oxygen but never heard back.  Caregiver states he attempted to reach provider office and called oncology office. Caregiver states patients O2 stats were not staying up on the E-tank so he had patient use the rebreather.  Caregiver states to preserve oxygen in E-tank he and patient decided to remove her oxygen to determine how long she could stay off without O2 sat dropping.  He states patient was off of the oxygen approximately 4  hour and during that time fell asleep. He reports patient woke up to an oxygen saturation in the 60's.  Caregiver reports calling EMS after reapplying oxygen and patient was taken to the ED.     Caregiver states he spoke with Ivin Booty, RN with oncology office regarding patients home health referral and was informed referral was made but not sent to specific home health provider.   RNCM advised that home health referral needed to be sent to patient/  caregivers agency of choice.  [name] states he will follow up with Ivin Booty, RN to discuss further.      Goals Addressed             This Visit's Progress    Patient / caregiver stated:  Management of health conditions       Care Coordination Interventions: Educated on patient wearing oxygen as instructed/ advised by provider.   Telephone call to caregiver [name] to confirm patient issues/ concerns resolved  Telephone call to Novella Olive, RN with oncology office to discuss patients home health referral.  Advised referral be submitted to [facility] home health per preference and as requested by patient caregiver.              SDOH assessments and interventions completed:  No     Care Coordination Interventions Activated:  Yes  Care Coordination Interventions:  Yes, provided   Follow up plan:  09/22/22 at 10 am    Encounter Outcome:  Pt. Visit Completed   Quinn Plowman RN,BSN,CCM [place] [phone] direct line

## 2022-08-25 NOTE — Patient Outreach (Signed)
  Care Coordination   Follow Up Visit Note   08/25/2022 Name: Natasha [name] MRN: [id number] DOB: 11-09-1945  Natasha [name] is a 76 y.o. year old female who sees [name], NP for primary care. I  spoke with [place] and [place] volunteer program  What matters to the patients health and wellness today?  Respite and additional cameras    Goals Addressed             This Visit's Progress    In home care needs       Care Coordination Interventions: Per caregiver request-referral completed for respite care through [facility]. Phone call to the medical alert program through [facility] to discuss additional camera options through donation. Confirmed that this is not an option at this time Follow up phone call with caregiver scheduled for 09/03/22        SDOH assessments and interventions completed:  No     Care Coordination Interventions Activated:  Yes  Care Coordination Interventions:  Yes, provided   Follow up plan: Follow up call scheduled for 09/03/22    Encounter Outcome:  Pt. Visit Completed

## 2022-08-25 NOTE — Telephone Encounter (Signed)
Lamar Blinks had called to say that the home health orders was in the computer but [name] the care giver wanted it to go to [facility]. I was given the telephone number [phone].  I called [name] and spoke to Elberta and said to send the order and notes. I sent the orders for her, her insurance and note from Dr. Janese Banks and her cancer dx. It was faxed to [phone] and the fax transmission went through

## 2022-08-25 NOTE — Telephone Encounter (Signed)
I got a call from Ms. Green about [employer]. I called her and got her voicemail. Left my direct number to call back

## 2022-08-26 ENCOUNTER — Other Ambulatory Visit: Payer: Self-pay | Admitting: Oncology

## 2022-08-26 ENCOUNTER — Telehealth: Payer: Self-pay

## 2022-08-26 DIAGNOSIS — E1165 Type 2 diabetes mellitus with hyperglycemia: Secondary | ICD-10-CM | POA: Diagnosis not present

## 2022-08-26 DIAGNOSIS — E039 Hypothyroidism, unspecified: Secondary | ICD-10-CM | POA: Diagnosis not present

## 2022-08-26 DIAGNOSIS — C8580 Other specified types of non-Hodgkin lymphoma, unspecified site: Secondary | ICD-10-CM

## 2022-08-26 DIAGNOSIS — J449 Chronic obstructive pulmonary disease, unspecified: Secondary | ICD-10-CM | POA: Diagnosis not present

## 2022-08-26 LAB — ACID FAST CULTURE WITH REFLEXED SENSITIVITIES (MYCOBACTERIA): Acid Fast Culture: NEGATIVE

## 2022-08-26 NOTE — Patient Outreach (Signed)
  Care Coordination   Follow Up Visit Note   08/26/2022 Name: Azaela Caracci Frayre MRN: [id number] DOB: Jun 07, 1946  Prentice Docker Cocuzza is a 76 y.o. year old female who sees [name], NP for primary care. I  Home health agencies  What matters to the patients health and wellness today?  Acquiring new home health service provider.   Call to [facility] home health to determine receipt of referral and estimated outreach date to patient/caregiver.  Per Loree Fee with [facility] home health they are unable to take referral due to staffing issues.   Telephone call to [facility] home health.  Representative states they are unable to take referral for services due to staffing issues.  Telephone call to [facility] home health.  Message left requesting return call.  Telephone call to [facility] home health.  Message left requesting return call.  Telephone call to [facility] home health.  Representative states a referral will have to be submitted to the branch director to determine if they can provide service to patient.   He also states they do not have home health aids on staff.  Telephone call to caregiver, [name] to updated him on status regarding home health request.    Goals Addressed             This Visit's Progress    Patient / caregiver stated:  Management of health conditions       Care Coordination Interventions: Telephone call to [facility] home health to confirm receipt of referral Telephone call to [facility], [facility], [facility], and [facility] home health to determine if able to staff patients service area.              SDOH assessments and interventions completed:  No     Care Coordination Interventions Activated:  Yes  Care Coordination Interventions:  Yes, provided   Follow up plan:  as previously scheduled    Encounter Outcome:  Pt. Visit Completed   Quinn Plowman RN,BSN,CCM [place] [phone] direct line

## 2022-08-27 ENCOUNTER — Telehealth: Payer: Self-pay

## 2022-08-27 NOTE — Patient Outreach (Signed)
  Care Coordination   Patient care consult  Visit Note   08/27/2022 Name: Jemia Fata Trant MRN: [id number] DOB: 1945/11/15  Prentice Docker Savoia is a 76 y.o. year old female who sees [name], NP for primary care. Patient care consult  What matters to the patients health and wellness today?  Acquiring home health service    Goals Addressed             This Visit's Progress    Patient / caregiver stated:  Management of health conditions       Care Coordination Interventions: Collaboration with Janalyn Shy, assistant clinical director / Royann Shivers, Director regarding home health acquisition concerns.              SDOH assessments and interventions completed:  No     Care Coordination Interventions Activated:  Yes  Care Coordination Interventions:  Yes, provided   Follow up plan:  as previously scheduled    Encounter Outcome:  Pt. Visit Completed   Quinn Plowman RN,BSN,CCM [place] [phone] direct line

## 2022-08-27 NOTE — Patient Outreach (Signed)
  Care Coordination   Follow Up Visit Note   08/27/2022 Name: Natasha [name] MRN: [id number] DOB: [birth year]  Natasha [name] is a 76 y.o. year old female who sees [name], NP for primary care. I spoke with  Natasha [name] by phone today.  What matters to the patients health and wellness today?  Spoke with caregiver to inform him that unable to find another home health agency at this time to provide services to patient.  Caregiver notified that RNCM still awaiting call from [facility] home health to determine if they are able to take patient.  Advised caregiver that he/ patient may have to reconsider [facility] if unable to secure another agency.     Goals Addressed             This Visit's Progress    Patient / caregiver stated:  Management of health conditions       Care Coordination Interventions: Caregiver called to update on status of acquiring new home health provider.              SDOH assessments and interventions completed:  No     Care Coordination Interventions Activated:  Yes  Care Coordination Interventions:  Yes, provided   Follow up plan:  as previously scheduled    Encounter Outcome:  Pt. Visit Completed   Quinn Plowman RN,BSN,CCM [place] [phone] direct line

## 2022-08-27 NOTE — Telephone Encounter (Signed)
1130 am.  Phone call made to son [name] to schedule a home visit with patient next week.  No answer.  Message left requesting a call back.

## 2022-08-28 ENCOUNTER — Other Ambulatory Visit: Payer: Self-pay

## 2022-08-29 ENCOUNTER — Other Ambulatory Visit: Payer: Self-pay

## 2022-08-29 ENCOUNTER — Emergency Department: Payer: Medicare Other

## 2022-08-29 ENCOUNTER — Inpatient Hospital Stay
Admission: EM | Admit: 2022-08-29 | Discharge: 2022-08-31 | DRG: 291 | Disposition: A | Discharge status: Home Health | Payer: Medicare Other | Attending: Internal Medicine | Admitting: Internal Medicine

## 2022-08-29 ENCOUNTER — Encounter: Payer: Self-pay | Admitting: Internal Medicine

## 2022-08-29 DIAGNOSIS — D696 Thrombocytopenia, unspecified: Secondary | ICD-10-CM

## 2022-08-29 DIAGNOSIS — Z515 Encounter for palliative care: Secondary | ICD-10-CM

## 2022-08-29 DIAGNOSIS — J9621 Acute and chronic respiratory failure with hypoxia: Secondary | ICD-10-CM | POA: Diagnosis present

## 2022-08-29 DIAGNOSIS — Z95828 Presence of other vascular implants and grafts: Secondary | ICD-10-CM

## 2022-08-29 DIAGNOSIS — Z1152 Encounter for screening for COVID-19: Secondary | ICD-10-CM | POA: Diagnosis not present

## 2022-08-29 DIAGNOSIS — Z8249 Family history of ischemic heart disease and other diseases of the circulatory system: Secondary | ICD-10-CM

## 2022-08-29 DIAGNOSIS — Z79899 Other long term (current) drug therapy: Secondary | ICD-10-CM | POA: Diagnosis not present

## 2022-08-29 DIAGNOSIS — I509 Heart failure, unspecified: Secondary | ICD-10-CM | POA: Diagnosis not present

## 2022-08-29 DIAGNOSIS — Z79890 Hormone replacement therapy (postmenopausal): Secondary | ICD-10-CM | POA: Diagnosis not present

## 2022-08-29 DIAGNOSIS — Z7189 Other specified counseling: Secondary | ICD-10-CM

## 2022-08-29 DIAGNOSIS — C8580 Other specified types of non-Hodgkin lymphoma, unspecified site: Secondary | ICD-10-CM | POA: Diagnosis not present

## 2022-08-29 DIAGNOSIS — D649 Anemia, unspecified: Secondary | ICD-10-CM | POA: Diagnosis not present

## 2022-08-29 DIAGNOSIS — J449 Chronic obstructive pulmonary disease, unspecified: Secondary | ICD-10-CM | POA: Diagnosis not present

## 2022-08-29 DIAGNOSIS — E1165 Type 2 diabetes mellitus with hyperglycemia: Secondary | ICD-10-CM | POA: Diagnosis not present

## 2022-08-29 DIAGNOSIS — E119 Type 2 diabetes mellitus without complications: Secondary | ICD-10-CM | POA: Diagnosis present

## 2022-08-29 DIAGNOSIS — E039 Hypothyroidism, unspecified: Secondary | ICD-10-CM | POA: Diagnosis present

## 2022-08-29 DIAGNOSIS — J441 Chronic obstructive pulmonary disease with (acute) exacerbation: Secondary | ICD-10-CM | POA: Diagnosis present

## 2022-08-29 DIAGNOSIS — Z87891 Personal history of nicotine dependence: Secondary | ICD-10-CM | POA: Diagnosis not present

## 2022-08-29 DIAGNOSIS — R0689 Other abnormalities of breathing: Secondary | ICD-10-CM | POA: Diagnosis not present

## 2022-08-29 DIAGNOSIS — J9601 Acute respiratory failure with hypoxia: Secondary | ICD-10-CM | POA: Diagnosis present

## 2022-08-29 DIAGNOSIS — I5033 Acute on chronic diastolic (congestive) heart failure: Principal | ICD-10-CM | POA: Diagnosis present

## 2022-08-29 DIAGNOSIS — Z9981 Dependence on supplemental oxygen: Secondary | ICD-10-CM | POA: Diagnosis not present

## 2022-08-29 DIAGNOSIS — A419 Sepsis, unspecified organism: Secondary | ICD-10-CM | POA: Insufficient documentation

## 2022-08-29 DIAGNOSIS — R062 Wheezing: Secondary | ICD-10-CM | POA: Diagnosis not present

## 2022-08-29 DIAGNOSIS — C8590 Non-Hodgkin lymphoma, unspecified, unspecified site: Secondary | ICD-10-CM | POA: Diagnosis not present

## 2022-08-29 DIAGNOSIS — D61818 Other pancytopenia: Secondary | ICD-10-CM | POA: Diagnosis present

## 2022-08-29 DIAGNOSIS — C8599 Non-Hodgkin lymphoma, unspecified, extranodal and solid organ sites: Secondary | ICD-10-CM | POA: Diagnosis not present

## 2022-08-29 DIAGNOSIS — Z794 Long term (current) use of insulin: Secondary | ICD-10-CM | POA: Diagnosis not present

## 2022-08-29 DIAGNOSIS — J90 Pleural effusion, not elsewhere classified: Secondary | ICD-10-CM | POA: Diagnosis not present

## 2022-08-29 DIAGNOSIS — C8300 Small cell B-cell lymphoma, unspecified site: Secondary | ICD-10-CM | POA: Diagnosis present

## 2022-08-29 DIAGNOSIS — Z743 Need for continuous supervision: Secondary | ICD-10-CM | POA: Diagnosis not present

## 2022-08-29 DIAGNOSIS — R6889 Other general symptoms and signs: Secondary | ICD-10-CM | POA: Diagnosis not present

## 2022-08-29 DIAGNOSIS — J9811 Atelectasis: Secondary | ICD-10-CM | POA: Diagnosis not present

## 2022-08-29 DIAGNOSIS — T380X5A Adverse effect of glucocorticoids and synthetic analogues, initial encounter: Secondary | ICD-10-CM | POA: Diagnosis not present

## 2022-08-29 DIAGNOSIS — R0602 Shortness of breath: Secondary | ICD-10-CM | POA: Diagnosis not present

## 2022-08-29 LAB — COMPREHENSIVE METABOLIC PANEL
ALT: 14 U/L (ref 0–44)
AST: 26 U/L (ref 15–41)
Albumin: 3.1 g/dL — ABNORMAL LOW (ref 3.5–5.0)
Alkaline Phosphatase: 174 U/L — ABNORMAL HIGH (ref 38–126)
Anion gap: 5 (ref 5–15)
BUN: 33 mg/dL — ABNORMAL HIGH (ref 8–23)
CO2: 29 mmol/L (ref 22–32)
Calcium: 9.5 mg/dL (ref 8.9–10.3)
Chloride: 107 mmol/L (ref 98–111)
Creatinine, Ser: 0.68 mg/dL (ref 0.44–1.00)
GFR, Estimated: >60 mL/min (ref >60)
Glucose, Bld: 140 mg/dL — ABNORMAL HIGH (ref 70–99)
Potassium: 4.4 mmol/L (ref 3.5–5.1)
Sodium: 141 mmol/L (ref 135–145)
Total Bilirubin: 1.6 mg/dL — ABNORMAL HIGH (ref 0.3–1.2)
Total Protein: 5.4 g/dL — ABNORMAL LOW (ref 6.5–8.1)

## 2022-08-29 LAB — CBC WITH DIFFERENTIAL/PLATELET
Abs Immature Granulocytes: 0.1 10*3/uL — ABNORMAL HIGH (ref 0.00–0.07)
Basophils Absolute: 0.1 10*3/uL (ref 0.0–0.1)
Basophils Relative: 2 %
Eosinophils Absolute: 0 10*3/uL (ref 0.0–0.5)
Eosinophils Relative: 1 %
HCT: 24.9 % — ABNORMAL LOW (ref 36.0–46.0)
Hemoglobin: 7.3 g/dL — ABNORMAL LOW (ref 12.0–15.0)
Immature Granulocytes: 2 %
Lymphocytes Relative: 67 %
Lymphs Abs: 2.8 10*3/uL (ref 0.7–4.0)
MCH: 27.1 pg (ref 26.0–34.0)
MCHC: 29.3 g/dL — ABNORMAL LOW (ref 30.0–36.0)
MCV: 92.6 fL (ref 80.0–100.0)
Monocytes Absolute: 0.6 10*3/uL (ref 0.1–1.0)
Monocytes Relative: 15 %
Neutro Abs: 0.6 10*3/uL — ABNORMAL LOW (ref 1.7–7.7)
Neutrophils Relative %: 13 %
Platelets: 83 10*3/uL — ABNORMAL LOW (ref 150–400)
RBC: 2.69 MIL/uL — ABNORMAL LOW (ref 3.87–5.11)
RDW: 18.6 % — ABNORMAL HIGH (ref 11.5–15.5)
Smear Review: NORMAL
WBC Morphology: ABNORMAL
WBC: 4.1 10*3/uL (ref 4.0–10.5)
nRBC: 1.2 % — ABNORMAL HIGH (ref 0.0–0.2)

## 2022-08-29 LAB — SARS CORONAVIRUS 2 BY RT PCR: SARS Coronavirus 2 by RT PCR: NEGATIVE

## 2022-08-29 LAB — APTT: aPTT: 35 seconds (ref 24–36)

## 2022-08-29 LAB — PROTIME-INR
INR: 1.2 (ref 0.8–1.2)
Prothrombin Time: 15.1 seconds (ref 11.4–15.2)

## 2022-08-29 LAB — BRAIN NATRIURETIC PEPTIDE: B Natriuretic Peptide: 222.3 pg/mL — ABNORMAL HIGH (ref 0.0–100.0)

## 2022-08-29 LAB — PROCALCITONIN: Procalcitonin: <0.1 ng/mL

## 2022-08-29 LAB — LACTIC ACID, PLASMA: Lactic Acid, Venous: 1.3 mmol/L (ref 0.5–1.9)

## 2022-08-29 LAB — TROPONIN I (HIGH SENSITIVITY): Troponin I (High Sensitivity): 7 ng/L (ref <18)

## 2022-08-29 LAB — CBG MONITORING, ED: Glucose-Capillary: 389 mg/dL — ABNORMAL HIGH (ref 70–99)

## 2022-08-29 MED ORDER — METHYLPREDNISOLONE SODIUM SUCC 40 MG IJ SOLR
40.0000 mg | Freq: Once | INTRAMUSCULAR | Status: AC
  Administered 2022-08-30: 40 mg via INTRAVENOUS
  Filled 2022-08-29: qty 1

## 2022-08-29 MED ORDER — DM-GUAIFENESIN ER 30-600 MG PO TB12: 1.0000 | ORAL_TABLET | Freq: Two times a day (BID) | ORAL | Status: DC | PRN

## 2022-08-29 MED ORDER — INSULIN ASPART PROT & ASPART (70-30 MIX) 100 UNIT/ML ~~LOC~~ SUSP
3.0000 [IU] | Freq: Two times a day (BID) | SUBCUTANEOUS | Status: DC
  Administered 2022-08-29: 3 [IU] via SUBCUTANEOUS
  Filled 2022-08-29: qty 10

## 2022-08-29 MED ORDER — AZITHROMYCIN 250 MG PO TABS
250.0000 mg | ORAL_TABLET | Freq: Every day | ORAL | Status: DC
  Administered 2022-08-30 – 2022-08-31 (×2): 250 mg via ORAL
  Filled 2022-08-29 (×2): qty 1

## 2022-08-29 MED ORDER — SIMETHICONE 80 MG PO CHEW: 200.0000 mg | CHEWABLE_TABLET | Freq: Four times a day (QID) | ORAL | Status: DC | PRN

## 2022-08-29 MED ORDER — HYDROCODONE-ACETAMINOPHEN 5-325 MG PO TABS
1.0000 | ORAL_TABLET | Freq: Three times a day (TID) | ORAL | Status: DC | PRN
  Administered 2022-08-30 (×2): 1 via ORAL
  Filled 2022-08-29 (×2): qty 1

## 2022-08-29 MED ORDER — ALBUTEROL SULFATE (2.5 MG/3ML) 0.083% IN NEBU: 2.5000 mg | INHALATION_SOLUTION | RESPIRATORY_TRACT | Status: DC | PRN

## 2022-08-29 MED ORDER — CLOBETASOL PROPIONATE 0.05 % EX CREA
1.0000 | TOPICAL_CREAM | Freq: Every day | CUTANEOUS | Status: DC
  Administered 2022-08-29 – 2022-08-30 (×2): 1 via TOPICAL
  Filled 2022-08-29 (×2): qty 15

## 2022-08-29 MED ORDER — IPRATROPIUM-ALBUTEROL 0.5-2.5 (3) MG/3ML IN SOLN
3.0000 mL | Freq: Once | RESPIRATORY_TRACT | Status: AC
  Administered 2022-08-29: 3 mL via RESPIRATORY_TRACT
  Filled 2022-08-29: qty 3

## 2022-08-29 MED ORDER — LEVOTHYROXINE SODIUM 50 MCG PO TABS
50.0000 ug | ORAL_TABLET | Freq: Every day | ORAL | Status: DC
  Administered 2022-08-30 – 2022-08-31 (×2): 50 ug via ORAL
  Filled 2022-08-29 (×2): qty 1

## 2022-08-29 MED ORDER — FUROSEMIDE 10 MG/ML IJ SOLN
40.0000 mg | Freq: Two times a day (BID) | INTRAMUSCULAR | Status: DC
  Administered 2022-08-29 – 2022-08-30 (×3): 40 mg via INTRAVENOUS
  Filled 2022-08-29 (×3): qty 4

## 2022-08-29 MED ORDER — FUROSEMIDE 10 MG/ML IJ SOLN
40.0000 mg | Freq: Once | INTRAMUSCULAR | Status: AC
  Administered 2022-08-29: 40 mg via INTRAVENOUS
  Filled 2022-08-29: qty 4

## 2022-08-29 MED ORDER — ONDANSETRON HCL 4 MG/2ML IJ SOLN: 4.0000 mg | Freq: Three times a day (TID) | INTRAMUSCULAR | Status: DC | PRN

## 2022-08-29 MED ORDER — METHYLPREDNISOLONE SODIUM SUCC 125 MG IJ SOLR
60.0000 mg | Freq: Once | INTRAMUSCULAR | Status: AC
  Administered 2022-08-29: 60 mg via INTRAVENOUS
  Filled 2022-08-29: qty 2

## 2022-08-29 MED ORDER — LOPERAMIDE HCL 2 MG PO CAPS: 2.0000 mg | ORAL_CAPSULE | ORAL | Status: DC | PRN

## 2022-08-29 MED ORDER — AZITHROMYCIN 500 MG PO TABS
500.0000 mg | ORAL_TABLET | Freq: Every day | ORAL | Status: AC
  Administered 2022-08-29: 500 mg via ORAL
  Filled 2022-08-29: qty 1

## 2022-08-29 MED ORDER — IPRATROPIUM-ALBUTEROL 0.5-2.5 (3) MG/3ML IN SOLN
3.0000 mL | RESPIRATORY_TRACT | Status: DC
  Administered 2022-08-29 – 2022-08-30 (×7): 3 mL via RESPIRATORY_TRACT
  Filled 2022-08-29 (×8): qty 3

## 2022-08-29 MED ORDER — ACETAMINOPHEN 325 MG PO TABS: 650.0000 mg | ORAL_TABLET | Freq: Four times a day (QID) | ORAL | Status: DC | PRN

## 2022-08-29 NOTE — ED Triage Notes (Signed)
Pt brought in by [facility] from home. Home health care states that mid 60's O2 sats on home 4L [place]. Hx of COPD per EMS states usual saturation 98% on 4L given 2 albuterol treatments by EMS. EMS reports saturation down to high 80s, placed on 15L NR[facility]. A&O x 4. MD at bedside, placed on [place] 6L

## 2022-08-29 NOTE — Consult Note (Signed)
Hematology/Oncology Consult note [facility], [place] Telephone:([phone] [phone]  Patient Care Team: [name], NP as PCP - General (Nurse Practitioner) [name], RN (Inactive) as Case Manager [name], RN as [place] Management   Name of the patient: Natasha [name]  [id number]  Jan 25, 1946    Reason for consult: Nodal marginal zone lymphoma   Requesting physician: Dr. 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  She was supposed to start outpatient Bendamustine Rituxan treatment but has had recurrent hospitalization and is yet to follow-up with me.  She has a port in place to start treatment.  She has been admitted for recurrent episodes of acute hypoxic respiratory failure.  She has had thoracentesis in the recent past as well and her pleural fluid has known lymphoma cells.  ECOG PS- 3  Pain scale- 0   Review of systems- Review of Systems  Constitutional:  Positive for malaise/fatigue. Negative for chills, fever and weight loss.  HENT:  Negative for congestion, ear discharge and nosebleeds.   Eyes:  Negative for blurred vision.  Respiratory:  Positive for shortness of breath. Negative for cough, hemoptysis, sputum production and wheezing.   Cardiovascular:  Negative for chest pain, palpitations, orthopnea and claudication.  Gastrointestinal:  Negative for abdominal pain, blood in stool, constipation, diarrhea, heartburn, melena, nausea and vomiting.  Genitourinary:  Negative for dysuria, flank pain, frequency, hematuria and urgency.  Musculoskeletal:  Negative for back pain, joint pain and myalgias.  Skin:  Negative for rash.  Neurological:  Negative for dizziness, tingling, focal weakness, seizures, weakness and headaches.  Endo/Heme/Allergies:  Does not  bruise/bleed easily.  Psychiatric/Behavioral:  Negative for depression and suicidal ideas. The patient does not have insomnia.     No Known Allergies  Patient Active Problem List   Diagnosis Date Noted   COPD with acute exacerbation ([place]) 08/29/2022   Diabetes mellitus without complication ([place]) 63/89/3734   Normocytic anemia 08/29/2022   Sepsis ([place]) 08/29/2022   Acute on chronic respiratory failure with hypoxemia ([place]) 08/16/2022   Acute on chronic anemia 08/02/2022   CHF exacerbation ([place]) 08/02/2022   Marginal zone lymphoma ([place]) 07/28/2022   Swelling of lower extremity 07/24/2022   Hypotension 07/24/2022   Goals of care, counseling/discussion    COVID-19 virus infection 07/14/2022   Acute on chronic diastolic CHF (congestive heart failure) ([place]) 07/14/2022   Anemia of chronic disease 07/14/2022   Thrombocytopenia ([place]) 07/14/2022   Hypothyroidism 07/14/2022   Chronic kidney disease, stage 3a ([place]) 07/14/2022   Bilateral pleural effusion 07/12/2022   Diabetes mellitus, type II ([place]) 07/12/2022   Acute on chronic respiratory failure with hypoxia ([place]) 07/12/2022   Recurrent pleural effusion 05/12/2022   Lower extremity edema 05/12/2022   Pancytopenia ([place]) 04/16/2022   Type 2 diabetes mellitus with proteinuria ([place]) 04/09/2022   Atherosclerosis of aorta ([place]) 04/09/2022   Chronic obstructive pulmonary disease (COPD) ([place]) 04/09/2022   SOB (shortness of breath) on exertion 04/09/2022   Acute cough 04/09/2022   Cigarette nicotine dependence, uncomplicated 28/76/8115   Post herpetic neuralgia 03/14/2020   Gall stones 10/23/2019   Lymphoma ([place]) 10/23/2019   Generalized lymphadenopathy 09/08/2018   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[place]) 09/08/2018   Primary osteoarthritis of both knees 07/07/2018   Senile purpura ([place]) 07/07/2018     Past Medical History:  Diagnosis Date   Anemia  Cancer ([place])    lymphoma-stomach    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   CHF (congestive heart  failure) (HCC)    COPD (chronic obstructive pulmonary disease) (HCC)    Diabetes mellitus without complication (HCC)    Hypotension    Pleural effusion    ARMc 849m,  2 weeks ago   Vaginal delivery    x 5     Past Surgical History:  Procedure Laterality Date   IR IMAGING GUIDED PORT INSERTION  08/16/2022   TONSILLECTOMY Bilateral    as a child    Social History   Socioeconomic History   Marital status: Widowed    Spouse name: Not on file   Number of children: 2   Years of education: Not on file   Highest education level: 9th grade  Occupational History   Occupation: unemployed  Tobacco Use   Smoking status: Former    Packs/day: 2.00    Years: 55.00    Total pack years: 110.00    Types: Cigarettes    Start date: 07/07/1961    Quit date: 08/02/2022    Years since quitting: 0.0   Smokeless tobacco: Never   Tobacco comments:    1/2 pack she states but family says 2 PPD  Vaping Use   Vaping Use: Never used  Substance and Sexual Activity   Alcohol use: No   Drug use: Never   Sexual activity: Not Currently    Partners: Male    Birth control/protection: Post-menopausal  Other Topics Concern   Not on file  Social History Narrative   08/14/20   [place]  Living: to be near daughter   Work: retired      FPhysiological scientistchildren - JIT consultant(nearby) and son JEvelena Peat[place]  8 grandchildren, & 2 great-grandchildren.      Enjoys: stays at home      Exercise: walking to her daughter's store   Diet: eats fruit, air fried chicken      Safety   Seat belts: Yes    Guns: No   Safe in relationships: Yes    Social Determinants of Health   Financial Resource Strain: Medium Risk (08/24/2022)   Overall Financial Resource Strain (CARDIA)    Difficulty of Paying Living Expenses: Somewhat hard  Food Insecurity: Food Insecurity Present (08/24/2022)   Hunger Vital Sign    Worried About Running Out of Food in the Last Year: Sometimes true    Ran Out of Food in the Last Year:  Sometimes true  Transportation Needs: No Transportation Needs (08/24/2022)   PRAPARE - THydrologist(Medical): No    Lack of Transportation (Non-Medical): No  Physical Activity: Inactive (08/24/2022)   Exercise Vital Sign    Days of Exercise per Week: 0 days    Minutes of Exercise per Session: 0 min  Stress: Stress Concern Present (08/24/2022)   [place]   Feeling of Stress : To some extent  Social Connections: Socially Isolated (08/24/2022)   Social Connection and Isolation Panel [NHANES]    Frequency of Communication with Friends and Family: More than three times a week    Frequency of Social Gatherings with Friends and Family: More than three times a week    Attends Religious Services: Never    AMarine scientistor Organizations: No    Attends CArchivistMeetings: Never    Marital Status: Widowed  IHuman resources officer  Violence: Not At Risk (08/16/2022)   Humiliation, Afraid, Rape, and Kick questionnaire    Fear of Current or Ex-Partner: No    Emotionally Abused: No    Physically Abused: No    Sexually Abused: No     Family History  Problem Relation Age of Onset   Breast cancer Mother    Alzheimer's disease Father    Lung cancer Father        lung   Varicose Veins Brother    Heart attack Brother      Current Facility-Administered Medications:    acetaminophen (TYLENOL) tablet 650 mg, 650 mg, Oral, Q6H PRN, Ivor Costa, MD   albuterol (PROVENTIL) (2.5 MG/3ML) 0.083% nebulizer solution 2.5 mg, 2.5 mg, Nebulization, Q4H PRN, Ivor Costa, MD   [COMPLETED] azithromycin (ZITHROMAX) tablet 500 mg, 500 mg, Oral, Daily, 500 mg at 08/29/22 1607 **FOLLOWED BY** [START ON 08/30/2022] azithromycin (ZITHROMAX) tablet 250 mg, 250 mg, Oral, Daily, Ivor Costa, MD   clobetasol cream (TEMOVATE) 4.08 % 1 Application, 1 Application, Topical, Daily, Ivor Costa, MD, 1 Application at 14/48/18  1959   dextromethorphan-guaiFENesin ([place] DM) 30-600 MG per 12 hr tablet 1 tablet, 1 tablet, Oral, BID PRN, Ivor Costa, MD   furosemide (LASIX) injection 40 mg, 40 mg, Intravenous, Q12H, Ivor Costa, MD   HYDROcodone-acetaminophen (NORCO/VICODIN) 5-325 MG per tablet 1 tablet, 1 tablet, Oral, Q8H PRN, Ivor Costa, MD   insulin aspart protamine- aspart (NOVOLOG MIX 70/30) injection 3 Units, 3 Units, Subcutaneous, BID WC, Ivor Costa, MD, 3 Units at 08/29/22 1959   ipratropium-albuterol (DUONEB) 0.5-2.5 (3) MG/3ML nebulizer solution 3 mL, 3 mL, Nebulization, Q4H, Ivor Costa, MD, 3 mL at 08/29/22 2002   [START ON 08/30/2022] levothyroxine (SYNTHROID) tablet 50 mcg, 50 mcg, Oral, Q0600, Ivor Costa, MD   loperamide (IMODIUM) capsule 2 mg, 2 mg, Oral, PRN, Ivor Costa, MD   [START ON 08/30/2022] methylPREDNISolone sodium succinate (SOLU-MEDROL) 40 mg/mL injection 40 mg, 40 mg, Intravenous, Once, Ivor Costa, MD   ondansetron (ZOFRAN) injection 4 mg, 4 mg, Intravenous, Q8H PRN, Ivor Costa, MD   simethicone (MYLICON) chewable tablet 200 mg, 200 mg, Oral, Q6H PRN, Ivor Costa, MD  Current Outpatient Medications:    clobetasol cream (TEMOVATE) 5.63 %, Apply 1 Application topically daily., Disp: , Rfl:    insulin NPH-regular Human (70-30) 100 UNIT/ML injection, Inject 5 Units into the skin 2 (two) times daily with a meal. Use only for hyperglycemia, CBG+ 300. Start with 5 units, titrate up to max 10 unit per dose if need. (Patient taking differently: Inject 10 Units into the skin 2 (two) times daily with a meal. Use only for hyperglycemia, CBG+ 300. Start with 5 units, titrate up to max 10 unit per dose if need.), Disp: 10 mL, Rfl: 0   insulin regular (NOVOLIN R) 100 units/mL injection, Inject 5-10 Units into the skin 3 (three) times daily before meals. BG 200-299 give 5 units BG 300-399 give 10 units., Disp: , Rfl:    Ipratropium-Albuterol (COMBIVENT RESPIMAT) 20-100 MCG/ACT AERS respimat, Inhale 1 puff into the  lungs 3 (three) times daily., Disp: , Rfl:    levothyroxine (SYNTHROID) 50 MCG tablet, Take 1 tablet (50 mcg total) by mouth daily at 6 (six) AM., Disp: 30 tablet, Rfl: 0   acyclovir (ZOVIRAX) 400 MG tablet, Take 400 mg by mouth daily., Disp: , Rfl:    albuterol (VENTOLIN HFA) 108 (90 Base) MCG/ACT inhaler, TAKE 2 PUFFS BY MOUTH EVERY 6 HOURS AS NEEDED FOR WHEEZE OR SHORTNESS  OF BREATH, Disp: 18 g, Rfl: 1   HYDROcodone-acetaminophen (NORCO) 5-325 MG tablet, Take 1 tablet by mouth every 8 (eight) hours as needed for moderate pain., Disp: 15 tablet, Rfl: 0   lidocaine-prilocaine (EMLA) cream, Apply to affected area once (Patient taking differently: 1 Application. Apply to mediport site 2 hours before infusion), Disp: 30 g, Rfl: 3   loperamide (IMODIUM) 2 MG capsule, Take 2 mg by mouth as needed for diarrhea or loose stools., Disp: , Rfl:    ondansetron (ZOFRAN) 8 MG tablet, Take 8 mg by mouth every 8 (eight) hours as needed for nausea, vomiting or refractory nausea / vomiting. Take 1 tablet q6H for 4 days after chemotherapy., Disp: , Rfl:    predniSONE (DELTASONE) 20 MG tablet, Take 20 mg by mouth daily with breakfast. (Patient not taking: Reported on 08/29/2022), Disp: , Rfl:    prochlorperazine (COMPAZINE) 10 MG tablet, Take 1 tablet by mouth every 6 (six) hours as needed for nausea or vomiting. Take 1 tab q6H for 4 days after chemotherapy., Disp: , Rfl:    simethicone (MYLICON) 80 MG chewable tablet, Chew 180 mg by mouth every 6 (six) hours as needed for flatulence. Take 1 capsule PO PRN gas relief., Disp: , Rfl:    Physical exam:  Vitals:   08/29/22 1630 08/29/22 1700 08/29/22 1730 08/29/22 1900  BP: 138/68 136/68 129/60   Pulse: (!) 108 (!) 106 (!) 101   Resp:   17   Temp:    98.1 F (36.7 C)  TempSrc:      SpO2: 92% 93% 94%   Weight:      Height:       Physical Exam Constitutional:      Comments: Appears in distress and shortness of breath  Cardiovascular:     Rate and Rhythm:  Regular rhythm. Tachycardia present.     Heart sounds: Normal heart sounds.  Pulmonary:     Comments: Effort of breathing increased.  Breath sounds decreased over bilateral bases Abdominal:     General: Bowel sounds are normal.     Palpations: Abdomen is soft.     Comments: Palpable splenomegaly  Lymphadenopathy:     Comments: Bilateral palpable axillary adenopathy  Skin:    General: Skin is warm and dry.  Neurological:     Mental Status: She is alert and oriented to person, place, and time.           Latest Ref Rng & Units 08/29/2022    1:25 PM  CMP  Glucose 70 - 99 mg/dL 140   BUN 8 - 23 mg/dL 33   Creatinine 0.44 - 1.00 mg/dL 0.68   Sodium 135 - 145 mmol/L 141   Potassium 3.5 - 5.1 mmol/L 4.4   Chloride 98 - 111 mmol/L 107   CO2 22 - 32 mmol/L 29   Calcium 8.9 - 10.3 mg/dL 9.5   Total Protein 6.5 - 8.1 g/dL 5.4   Total Bilirubin 0.3 - 1.2 mg/dL 1.6   Alkaline Phos 38 - 126 U/L 174   AST 15 - 41 U/L 26   ALT 0 - 44 U/L 14       Latest Ref Rng & Units 08/29/2022    1:25 PM  CBC  WBC 4.0 - 10.5 K/uL 4.1   Hemoglobin 12.0 - 15.0 g/dL 7.3   Hematocrit 36.0 - 46.0 % 24.9   Platelets 150 - 400 K/uL 83     '@IMAGES'$ @  DG Chest Portable 1 View  Result Date: 08/29/2022 CLINICAL DATA:  Shortness of breath. EXAM: PORTABLE CHEST 1 VIEW COMPARISON:  08/17/2022 FINDINGS: Moderate to large-sized bilateral pleural effusions, significantly increased. Progressive increase in prominence of the pulmonary vasculature and interstitial markings. Less airspace opacity and volume loss at the lung bases. Stable right jugular porta catheter with its tip in the region of the superior cavoatrial junction or upper right atrium. Unremarkable bones. IMPRESSION: 1. Moderate to large-sized bilateral pleural effusions, significantly increased. 2. Progressive changes of congestive heart failure. 3. Mildly improved bibasilar atelectasis. Electronically Signed   By: Claudie Revering M.D.   On: 08/29/2022  14:17   US THORACENTESIS ASP PLEURAL SPACE W/IMG GUIDE  Result Date: 08/17/2022 INDICATION: Patient with history of gastric lymphoma with recurrent pleural effusions. Request is for therapeutic thoracentesis. EXAM: ULTRASOUND GUIDED THERAPEUTIC RIGHT-SIDED THORACENTESIS MEDICATIONS: Lidocaine 1% 10 mL COMPLICATIONS: None immediate. PROCEDURE: An ultrasound guided thoracentesis was thoroughly discussed with the patient and questions answered. The benefits, risks, alternatives and complications were also discussed. The patient understands and wishes to proceed with the procedure. Written consent was obtained. Ultrasound was performed to localize and mark an adequate pocket of fluid in the RIGHT chest. The area was then prepped and draped in the normal sterile fashion. 1% Lidocaine was used for local anesthesia. Under ultrasound guidance a 6 Fr Safe-T-Centesis catheter was introduced. Thoracentesis was performed. The catheter was removed and a dressing applied. FINDINGS: A total of approximately 850 mL of straw-colored fluid was removed. IMPRESSION: Successful ultrasound guided therapeutic RIGHT-sided thoracentesis yielding 850 mL of pleural fluid. Read by: Rushie Nyhan, NP Electronically Signed   By: Michaelle Birks M.D.   On: 08/17/2022 13:23   DG Chest Port 1 View  Result Date: 08/17/2022 CLINICAL DATA:  Post right-sided thoracentesis EXAM: PORTABLE CHEST 1 VIEW COMPARISON:  08/16/2022; 08/03/2022; chest CT-07/12/2022 FINDINGS: Grossly unchanged enlarged cardiac silhouette and mediastinal contours. Stable positioning of support apparatus. Interval reduction in persistent small right-sided effusion post thoracentesis. No pneumothorax. Improved aeration the right lung base with persistent right basilar heterogeneous/consolidative opacities. Unchanged small left-sided effusion associated left basilar opacities. Pulmonary vasculature remains indistinct with cephalization of flow. No acute osseous  abnormalities. IMPRESSION: 1. Interval reduction in persistent small right-sided effusion post thoracentesis. No pneumothorax. 2. Improved aeration the right lung base with otherwise similar findings of pulmonary edema, small left-sided effusion and associated bibasilar opacities, likely atelectasis. Electronically Signed   By: Sandi Mariscal M.D.   On: 08/17/2022 13:12   DG Chest Port 1 View  Result Date: 08/16/2022 CLINICAL DATA:  Shortness of breath. EXAM: PORTABLE CHEST 1 VIEW COMPARISON:  08/03/2022 FINDINGS: Right jugular Port-A-Cath with the tip in the lower SVC region. Markedly increased densities in the mid and lower right chest that could represent a large pleural fluid and/or airspace disease. Again noted are prominent lung markings bilaterally which are similar to the previous examination. Negative for pneumothorax. Heart size is grossly stable. Slightly increased densities at the left lung base could represent atelectasis or pleural fluid. IMPRESSION: 1. Markedly increased densities in the mid and lower right chest. Findings could represent a large pleural effusion and/or airspace disease. 2. Prominent lung markings bilaterally are similar to the previous examination and could represent vascular congestion or pulmonary edema. Electronically Signed   By: Markus Daft M.D.   On: 08/16/2022 15:52   IR IMAGING GUIDED PORT INSERTION  Result Date: 08/16/2022 INDICATION: pt has Marginal zone lymphoma and needs port insertion for treatment EXAM: Chest port placement using ultrasound and  fluoroscopic guidance MEDICATIONS: As documented in EMR ANESTHESIA/SEDATION: Moderate (conscious) sedation was employed during this procedure. A total of Versed 1 mg and Fentanyl 50 mcg was administered intravenously. Moderate Sedation Time: 25 minutes. The patient's level of consciousness and vital signs were monitored continuously by radiology nursing throughout the procedure under my direct supervision. FLUOROSCOPY TIME:   Fluoroscopy Time: 0.5 minutes (5 mGy) COMPLICATIONS: None immediate. PROCEDURE: Informed written consent was obtained from the patient after a thorough discussion of the procedural risks, benefits and alternatives. All questions were addressed. Maximal Sterile Barrier Technique was utilized including caps, mask, sterile gowns, sterile gloves, sterile drape, hand hygiene and skin antiseptic. A timeout was performed prior to the initiation of the procedure. The patient was placed supine on the exam table. The right neck and chest was prepped and draped in the standard sterile fashion. A preliminary ultrasound of the right neck was performed and demonstrates a patent right internal jugular vein. A permanent ultrasound image was stored in the electronic medical record. The overlying skin was anesthetized with 1% Lidocaine. Using ultrasound guidance, access was obtained into the right internal jugular vein using a 21 gauge micropuncture set. A wire was advanced into the SVC, a short incision was made at the puncture site, and serial dilatation performed. Next, in an ipsilateral infraclavicular location, an incision was made at the site of the subcutaneous reservoir. Blunt dissection was used to open a pocket to contain the reservoir. A subcutaneous tunnel was then created from the port site to the puncture site. A(n) 8 Fr single lumen catheter was advanced through the tunnel. The catheter was attached to the port and this was placed in the subcutaneous pocket. Under fluoroscopic guidance, a peel away sheath was placed, and the catheter was trimmed to the appropriate length and was advanced into the central veins. The catheter length is 24 cm. The tip of the catheter lies near the superior cavoatrial junction. The port flushes and aspirates appropriately. The port was flushed and locked with heparinized saline. The port pocket was closed in 2 layers using 3-0 and 4-0 Vicryl/absorbable suture. Dermabond was also applied  to both incisions. The patient was transferred to recovery in stable condition. IMPRESSION: Successful placement of a right chest port via the right internal jugular vein. The port is ready for immediate use. Electronically Signed   By: Albin Felling M.D.   On: 08/16/2022 15:11   US THORACENTESIS ASP PLEURAL SPACE W/IMG GUIDE  Result Date: 08/03/2022 INDICATION: Patient with a history of gastric lymphoma with recurrent pleural effusions. Interventional radiology asked to perform a diagnostic and therapeutic thoracentesis. EXAM: ULTRASOUND GUIDED RIGHT THORACENTESIS MEDICATIONS: 1% lidocaine 10 mL COMPLICATIONS: None immediate. PROCEDURE: An ultrasound guided thoracentesis was thoroughly discussed with the patient and questions answered. The benefits, risks, alternatives and complications were also discussed. The patient understands and wishes to proceed with the procedure. Written consent was obtained. Ultrasound was performed to localize and mark an adequate pocket of fluid in the right chest. The area was then prepped and draped in the normal sterile fashion. 1% Lidocaine was used for local anesthesia. Under ultrasound guidance a 6 Fr Safe-T-Centesis catheter was introduced. Thoracentesis was performed. The catheter was removed and a dressing applied. FINDINGS: A total of approximately 1 L of clear yellow fluid was removed. Samples were sent to the laboratory as requested by the clinical team. IMPRESSION: Successful ultrasound guided right thoracentesis yielding 1 L of pleural fluid. Read by: Soyla Dryer, NP Electronically Signed   By:  Ruthann Cancer M.D.   On: 08/03/2022 13:01   DG Chest Port 1 View  Result Date: 08/03/2022 CLINICAL DATA:  Status post thoracentesis. EXAM: PORTABLE CHEST 1 VIEW COMPARISON:  08/02/2022 FINDINGS: 1151 hours. No evidence for pneumothorax. Cardiopericardial silhouette is at upper limits of normal for size. Vascular congestion with diffuse interstitial opacity. There is  bibasilar atelectasis with small bilateral pleural effusions, decreased in the interval. Telemetry leads overlie the chest. IMPRESSION: 1. No evidence for pneumothorax status post thoracentesis. 2. Interval decrease in bilateral pleural effusions. Electronically Signed   By: Misty Stanley M.D.   On: 08/03/2022 12:07   DG Chest 2 View  Result Date: 08/02/2022 CLINICAL DATA:  Shortness of breath EXAM: CHEST - 2 VIEW COMPARISON:  Chest x-ray dated July 13, 2022 FINDINGS: Visualized cardiac and mediastinal contours are unchanged. Right lung granuloma. Moderate bilateral pleural effusions and bibasilar atelectasis. No evidence of pneumothorax. IMPRESSION: Moderate bilateral pleural effusions and bibasilar atelectasis, similar to prior radiograph. Electronically Signed   By: Yetta Glassman M.D.   On: 08/02/2022 16:46    Assessment and plan- Patient is a 76 y.o. 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  Although she has a port in place she is yet to start treatment.  She was supposed to follow-up with me after her recent hospitalization but did not do so due to poor health as well as transportation issues.  I discussed with the patient that her problems and hospitalizations will keep recurring if we did not start treatment for her lymphoma ASAP.  During this hospitalization I would recommend another thoracentesis as well as pulse dose steroids.  Patient wants a treatment which will not make her lose hair.  Although Bendamustine and Rituxan are anti-B cell therapy they would be akin to chemotherapy which the patient is hesitant to use the word chemotherapy.  Given that she is doing poorly overall I am considering not to proceed with Bendamustine  Rituxan but instead proceed with single agent oral acalabrutinib as an outpatient or Zan ibrutinib.  I will work with my outpatient oral pharmacy and get this approved ASAP so that we can get started as soon as the patient gets discharged after her present hospitalization.  Patient verbalized understanding of the plan  Continue supportive transfusion with irradiated blood products to keep hemoglobin more than 7.  Her cytopenias are secondary to her underlying lymphoma.  Visit Diagnosis 1. COPD exacerbation ([place])   2. Chronic bilateral pleural effusions   3. Acute on chronic respiratory failure with hypoxia ([place])     Dr. Randa Evens, MD, MPH [facility] at [facility] [phone] 08/29/2022

## 2022-08-29 NOTE — ED Provider Notes (Signed)
[facility] Provider Note    Event Date/Time   First MD Initiated Contact with Patient 08/29/22 1324     (approximate)   History   Shortness of Breath   HPI  Natasha [name] is a 76 y.o. female with past medical history of chronic respiratory failure secondary to recurrent right pleural effusion/COPD and chronic diastolic heart failure on 4 L nasal cannula at baseline who presents with shortness of breath.  Patient tells me her symptoms started last night.  She has not had any fevers chills or chest pain.  She has cough but is nonproductive.  Denies any increase in lower extremity edema says she has this chronically.  Denies fevers or chills.  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  Has been compliant with her medication.  Patient was admitted about 2 weeks ago for hypoxic respiratory failure underwent a thoracentesis with removal of about 900 cc of fluid from the right and patient was back down to her baseline 3 L nasal cannula.     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   CHF (congestive heart failure) (HCC)    COPD (chronic obstructive pulmonary disease) (HCC)    Diabetes mellitus without complication (HCC)    Hypotension    Pleural effusion    AR[facility] 861m,  2 weeks ago   Vaginal delivery    x 5    Patient Active Problem List   Diagnosis Date Noted   Acute on chronic respiratory failure with hypoxemia (H[place] 08/16/2022   Acute on chronic anemia 08/02/2022   CHF exacerbation ([place] 08/02/2022   Marginal zone lymphoma (HCC) 07/28/2022   Swelling of lower extremity 07/24/2022   Hypotension 07/24/2022   Goals of care, counseling/discussion    COVID-19 virus infection 07/14/2022   Acute on chronic diastolic CHF (congestive heart failure) (H[place] 07/14/2022   Anemia of chronic disease 07/14/2022   Thrombocytopenia  (HR[place] 07/14/2022   Hypothyroidism 07/14/2022   Chronic kidney disease, stage 3a (HB[place] 07/14/2022   Bilateral pleural effusion 07/12/2022   Diabetes mellitus, type II (HGregory 07/12/2022   Acute on chronic respiratory failure with hypoxia ([place] 07/12/2022   Recurrent pleural effusion 05/12/2022   Lower extremity edema 05/12/2022   Pancytopenia ([place] 04/16/2022   Type 2 diabetes mellitus with proteinuria (HCap[place] 04/09/2022   Atherosclerosis of aorta ([place] 04/09/2022   Chronic obstructive pulmonary disease (COPD) (H[place] 04/09/2022   SOB (shortness of breath) on exertion 04/09/2022   Acute cough 04/09/2022   Cigarette nicotine dependence, uncomplicated 116/04/3709  Post herpetic neuralgia 03/14/2020   Gall stones 10/23/2019   Lymphoma ([place] 10/23/2019   Generalized lymphadenopathy 09/08/2018   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HMin[place] 09/08/2018   Primary osteoarthritis of both knees 07/07/2018   Senile purpura (HLa [place] 07/07/2018     Physical Exam  Triage Vital Signs: ED Triage Vitals  Enc Vitals Group     BP      Pulse      Resp      Temp      Temp src      SpO2      Weight      Height      Head Circumference      Peak Flow      Pain Score      Pain Loc  Pain Edu?      Excl. in [place]?     Most recent vital signs: There were no vitals filed for this visit.   General: Awake, chronically ill-appearing CV:  Good peripheral perfusion.  Resp:  Patient is dyspneic with prolonged expiratory phase and tachypneic, wheezing throughout decreased breath sounds at the bases Abd:  No distention.  Neuro:             Awake, Alert, Oriented x 3  Other:  Pitting edema in the bilateral lower extremities   ED Results / Procedures / Treatments  Labs (all labs ordered are listed, but only abnormal results are displayed) Labs Reviewed  COMPREHENSIVE METABOLIC PANEL - Abnormal; Notable for the following components:      Result Value   Glucose, Bld 140 (*)    BUN 33 (*)    Total Protein 5.4 (*)     Albumin 3.1 (*)    Alkaline Phosphatase 174 (*)    Total Bilirubin 1.6 (*)    All other components within normal limits  BRAIN NATRIURETIC PEPTIDE - Abnormal; Notable for the following components:   B Natriuretic Peptide 222.3 (*)    All other components within normal limits  CBC WITH DIFFERENTIAL/PLATELET - Abnormal; Notable for the following components:   RBC 2.69 (*)    Hemoglobin 7.3 (*)    HCT 24.9 (*)    MCHC 29.3 (*)    RDW 18.6 (*)    Platelets 83 (*)    nRBC 1.2 (*)    Neutro Abs 0.6 (*)    Abs Immature Granulocytes 0.10 (*)    All other components within normal limits  SARS CORONAVIRUS 2 BY RT PCR  CBC WITH DIFFERENTIAL/PLATELET  CBC WITH DIFFERENTIAL/PLATELET  TROPONIN I (HIGH SENSITIVITY)     EKG  EKG reviewed interpreted myself shows sinus rhythm normal axis normal intervals low voltage   RADIOLOGY Chest x-ray reviewed interpreted myself shows bilateral pleural effusions   PROCEDURES:  Critical Care performed: Yes, see critical care procedure note(s)  .Critical Care  Performed by: Rada Hay, MD Authorized by: Rada Hay, MD   Critical care provider statement:    Critical care time (minutes):  30   Critical care was time spent personally by me on the following activities:  Development of treatment plan with patient or surrogate, discussions with consultants, evaluation of patient's response to treatment, examination of patient, ordering and review of laboratory studies, ordering and review of radiographic studies, ordering and performing treatments and interventions, pulse oximetry, re-evaluation of patient's condition and review of old charts .1-3 Lead EKG Interpretation  Performed by: Rada Hay, MD Authorized by: Rada Hay, MD     Interpretation: normal     ECG rate assessment: normal     Rhythm: sinus rhythm     Ectopy: none     Conduction: normal     The patient is on the cardiac monitor to evaluate for  evidence of arrhythmia and/or significant heart rate changes.   MEDICATIONS ORDERED IN ED: Medications  furosemide (LASIX) injection 40 mg (has no administration in time range)  ipratropium-albuterol (DUONEB) 0.5-2.5 (3) MG/3ML nebulizer solution 3 mL (3 mLs Nebulization Given 08/29/22 1330)  ipratropium-albuterol (DUONEB) 0.5-2.5 (3) MG/3ML nebulizer solution 3 mL (3 mLs Nebulization Given 08/29/22 1329)  ipratropium-albuterol (DUONEB) 0.5-2.5 (3) MG/3ML nebulizer solution 3 mL (3 mLs Nebulization Given 08/29/22 1329)  methylPREDNISolone sodium succinate (SOLU-MEDROL) 125 mg/2 mL injection 60 mg (60 mg Intravenous Given 08/29/22 1329)  IMPRESSION / MDM / ASSESSMENT AND PLAN / ED COURSE  I reviewed the triage vital signs and the nursing notes.                              Patient's presentation is most consistent with acute presentation with potential threat to life or bodily function.  Differential diagnosis includes, but is not limited to, CHF, recurrent pleural effusion, COPD exacerbation, pneumothorax, pulmonary embolism  Patient is a 76 year old female with history of chronic respiratory failure from CHF COPD presents today because of worsening shortness of breath.  Symptoms started yesterday.  Initially with EMS she is on 15 L nonrebreather satting in the low 90s.  Transitioned from 6 L then back to 4 L.  She is tachypneic chronically ill-appearing with wheezing and decreased air movement.  Does have lower extremity edema as well.  Initially treated with bronchodilators and steroids to concern for COPD.  Chest x-ray has significant increase in size of bilateral pleural effusions concerning for worsening CHF.  She has required thoracentesis in the past last during admission 2 weeks ago.  We will give a dose of IV Lasix.  Patient is feeling improved after bronchodilators.  Labs are notable for elevated BNP hemoglobin is around 7 which is near her baseline.  Troponins negative.  Given  patient's worsening pleural effusions and that she is symptomatic I do think she requires admission for ongoing treatment.  May need thoracentesis.       FINAL CLINICAL IMPRESSION(S) / ED DIAGNOSES   Final diagnoses:  COPD exacerbation ([place])  Chronic bilateral pleural effusions  Acute on chronic respiratory failure with hypoxia ([place])     Rx / DC Orders   ED Discharge Orders     None        Note:  This document was prepared using Dragon voice recognition software and may include unintentional dictation errors.   Rada Hay, MD 08/29/22 [phone]

## 2022-08-29 NOTE — H&P (Signed)
History and Physical    Natasha [name] UDJ:[id number] DOB: 1946-04-30 DOA: 08/29/2022  Referring MD/NP/PA:   PCP: [name], NP   Patient coming from:  The patient is coming from home.     Chief Complaint: SOB  HPI: Natasha [name] is a 76 y.o. 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   Patient states that since last night, her shortness breath has been progressively worsening.  Patient has dry cough, no chest pain.  No fever or chills.  Denies nausea vomiting, diarrhea or abdominal pain.  No symptoms of UTI.  Patient has a worsening bilateral lower leg edema.  Of note, patient has recurrent pleural effusion.  Recently underwent right-sided thoracentesis.   Patient was found to have severe respiratory distress, cannot speak in full sentence. Patient initially had oxygen desaturation to 80s on home level 4 L oxygen, started on nonrebreather, then her oxygen saturation has improved to 96% on 4 L oxygen in ED.  Data reviewed independently and ED Course: pt was found to have BNP 222, troponin level 7, WBC 6.1, electrolytes renal function okay, negative COVID PCR, temperature normal, blood pressure 129/68, heart rate 101, 96, RR 22.  Chest x-ray showed worsening bilateral pleural effusion (moderate to large size) and pulmonary edema.  Patient is admitted to PCU as inpatient.  Dr. Janese Banks of oncology is consulted.  EKG: I have personally reviewed.  Sinus rhythm, QTc 453, low voltage, PVC.   Review of Systems:   General: no fevers, chills, no body weight gain, has poor appetite, has fatigue HEENT: no blurry vision, hearing changes or sore throat Respiratory: Has dyspnea, coughing, wheezing CV: no chest pain, no palpitations GI: no nausea, vomiting, abdominal pain, diarrhea, constipation GU: no dysuria, burning on urination,  increased urinary frequency, hematuria  Ext: Has leg edema Neuro: no unilateral weakness, numbness, or tingling, no vision change or hearing loss Skin: no rash, no skin tear. MSK: No muscle spasm, no deformity, no limitation of range of movement in spin Heme: No easy bruising.  Travel history: No recent long distant travel.   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 Her smoking use included cigarettes. She started smoking about 61 years ago. She has a 110.00 pack-year smoking history. She has never used smokeless tobacco. She reports that she does not drink alcohol and does not use drugs.  Family History:  Family History  Problem Relation Age of Onset   Breast cancer Mother    Alzheimer's disease Father    Lung cancer Father        lung   Varicose Veins Brother    Heart attack Brother      Prior to Admission  medications   Medication Sig Start Date End Date Taking? Authorizing Provider  acyclovir (ZOVIRAX) 400 MG tablet Take 400 mg by mouth daily. 08/09/22   [provider]  albuterol (VENTOLIN HFA) 108 (90 Base) MCG/ACT inhaler TAKE 2 PUFFS BY MOUTH EVERY 6 HOURS AS NEEDED FOR WHEEZE OR SHORTNESS OF BREATH 06/17/22   Ralene Muskrat B, MD  HYDROcodone-acetaminophen (NORCO) 5-325 MG tablet Take 1 tablet by mouth every 8 (eight) hours as needed for moderate pain. 08/17/22   Fritzi Mandes, MD  insulin NPH-regular Human (70-30) 100  UNIT/ML injection Inject 5 Units into the skin 2 (two) times daily with a meal. Use only for hyperglycemia, CBG+ 300. Start with 5 units, titrate up to max 10 unit per dose if need. 08/17/22   Fritzi Mandes, MD  insulin regular (NOVOLIN R) 100 units/mL injection Inject 5-10 Units into the skin 3 (three) times daily before meals. BG 200-299 give 5 units BG 300-399 give 10 units.    [provider]  Ipratropium-Albuterol (COMBIVENT RESPIMAT) 20-100 MCG/ACT AERS respimat Inhale 1 puff into the lungs every 6 (six) hours.    [provider]  levothyroxine (SYNTHROID) 50 MCG tablet Take 1 tablet (50 mcg total) by mouth daily at 6 (six) AM. 07/17/22   Sharen Hones, MD  lidocaine-prilocaine (EMLA) cream Apply to affected area once Patient taking differently: 1 Application. Apply to mediport site 2 hours before infusion 07/28/22   Sindy Guadeloupe, MD  loperamide (IMODIUM) 2 MG capsule Take 2 mg by mouth as needed for diarrhea or loose stools.    [provider]  ondansetron (ZOFRAN) 8 MG tablet Take 8 mg by mouth every 8 (eight) hours as needed for nausea, vomiting or refractory nausea / vomiting. Take 1 tablet q6H for 4 days after chemotherapy. 08/09/22   [provider]  predniSONE (DELTASONE) 20 MG tablet Take 20 mg by mouth daily with breakfast.    [provider]  prochlorperazine (COMPAZINE) 10 MG tablet Take 1 tablet by mouth every 6 (six) hours as needed for nausea or vomiting. Take 1 tab q6H for 4 days after chemotherapy. 08/09/22   [provider]  simethicone (MYLICON) 80 MG chewable tablet Chew 180 mg by mouth every 6 (six) hours as needed for flatulence. Take 1 capsule PO PRN gas relief.    [provider]    Physical Exam: Vitals:   08/29/22 1500 08/29/22 1515 08/29/22 1518 08/29/22 1541  BP: 129/68     Pulse: 99 96    Resp: 19 20    Temp:   98.1 F (36.7 C)   TempSrc:   Oral   SpO2: 96% 96%    Weight:    63.5 kg  Height:    5'  5" (1.651 m)   General: Patient is in acute respiratory distress HEENT:       Eyes: PERRL, EOMI, no scleral icterus.       ENT: No discharge from the ears and nose, no pharynx injection, no tonsillar enlargement.        Neck: Positive JVD, no bruit, no mass felt. Heme: No neck lymph node enlargement. Cardiac: S1/S2, RRR, No murmurs, No gallops or rubs. Respiratory: Has coarse breathing sound bilaterally, has wheezing bilaterally, has crackles bilaterally GI: Soft, nondistended, nontender, no rebound pain, no organomegaly, BS present. GU: No hematuria Ext: 1+ leg edema bilaterally. 1+DP/PT pulse bilaterally. Musculoskeletal: No joint deformities, No joint redness or warmth, no limitation of ROM in spin. Skin: No rashes.  Neuro: Alert, oriented X3, cranial nerves II-XII grossly intact, moves all extremities normally.   Psych: Patient is not psychotic, no suicidal or hemocidal ideation.  Labs on Admission: I have personally reviewed following labs and imaging studies  CBC: Recent Labs  Lab 08/29/22 1325  WBC 4.1  NEUTROABS 0.6*  HGB 7.3*  HCT 24.9*  MCV 92.6  PLT 83*   Basic Metabolic Panel: Recent Labs  Lab 08/29/22 1325  NA 141  K 4.4  CL 107  CO2 29  GLUCOSE 140*  BUN 33*  CREATININE 0.68  CALCIUM 9.5   GFR: Estimated Creatinine Clearance: 55.5 mL/min (by C-G formula based on SCr of 0.68 mg/dL). Liver Function Tests: Recent Labs  Lab 08/29/22 1325  AST 26  ALT 14  ALKPHOS 174*  BILITOT 1.6*  PROT 5.4*  ALBUMIN 3.1*   No results for input(s): "LIPASE", "AMYLASE" in the last 168 hours. No results for input(s): "AMMONIA" in the last 168 hours. Coagulation Profile: Recent Labs  Lab 08/29/22 1325  INR 1.2   Cardiac Enzymes: No results for input(s): "CKTOTAL", "CKMB", "CKMBINDEX", "TROPONINI" in the last 168 hours. BNP (last 3 results) No results for input(s): "PROBNP" in the last 8760 hours. HbA1C: No results for input(s): "HGBA1C" in the last 72  hours. CBG: Recent Labs  Lab 08/23/22 0739  GLUCAP 154*   Lipid Profile: No results for input(s): "CHOL", "HDL", "LDLCALC", "TRIG", "CHOLHDL", "LDLDIRECT" in the last 72 hours. Thyroid Function Tests: No results for input(s): "TSH", "T4TOTAL", "FREET4", "T3FREE", "THYROIDAB" in the last 72 hours. Anemia Panel: No results for input(s): "VITAMINB12", "FOLATE", "FERRITIN", "TIBC", "IRON", "RETICCTPCT" in the last 72 hours. Urine analysis:    Component Value Date/Time   COLORURINE AMBER (A) 06/11/2018 1308   APPEARANCEUR CLOUDY (A) 06/11/2018 1308   LABSPEC 1.022 06/11/2018 1308   PHURINE 5.0 06/11/2018 1308   GLUCOSEU >=500 (A) 06/11/2018 1308   HGBUR LARGE (A) 06/11/2018 1308   BILIRUBINUR NEGATIVE 06/11/2018 1308   KETONESUR NEGATIVE 06/11/2018 1308   PROTEINUR 30 (A) 06/11/2018 1308   NITRITE NEGATIVE 06/11/2018 1308   LEUKOCYTESUR LARGE (A) 06/11/2018 1308   Sepsis Labs: '@LABRCNTIP'$ (procalcitonin:4,lacticidven:4) ) Recent Results (from the past 240 hour(s))  SARS Coronavirus 2 by RT PCR (hospital order, performed in [facility] hospital lab) *cepheid single result test* Anterior Nasal Swab     Status: None   Collection Time: 08/29/22  1:26 PM   Specimen: Anterior Nasal Swab  Result Value Ref Range Status   SARS Coronavirus 2 by RT PCR NEGATIVE NEGATIVE Final    Comment: (NOTE) SARS-CoV-2 target nucleic acids are NOT DETECTED.  The SARS-CoV-2 RNA is generally detectable in upper and lower respiratory specimens during the acute phase of infection. The lowest concentration of SARS-CoV-2 viral copies this assay can detect is 250 copies / mL. A negative result does not preclude SARS-CoV-2 infection and should not be used as the sole basis for treatment or other patient management decisions.  A negative result may occur with improper specimen collection / handling, submission of specimen other than nasopharyngeal swab, presence of viral mutation(s) within the areas targeted  by this assay, and inadequate number of viral copies (<250 copies / mL). A negative result must be combined with clinical observations, patient history, and epidemiological information.  Fact Sheet for Patients:   https://www.patel.info/  Fact Sheet for Healthcare Providers: https://hall.com/  This test is not yet approved or  cleared by the [place] FDA and has been authorized for detection and/or diagnosis of  SARS-CoV-2 by FDA under an Emergency Use Authorization (EUA).  This EUA will remain in effect (meaning this test can be used) for the duration of the COVID-19 declaration under Section 564(b)(1) of the Act, 21 U.S.C. section 360bbb-3(b)(1), unless the authorization is terminated or revoked sooner.  Performed at [facility], [address], [place], [place] [address]      Radiological Exams on Admission: DG Chest Portable 1 View  Result Date: 08/29/2022 CLINICAL DATA:  Shortness of breath. EXAM: PORTABLE CHEST 1 VIEW COMPARISON:  08/17/2022 FINDINGS: Moderate to large-sized bilateral pleural effusions, significantly increased. Progressive increase in prominence of the pulmonary vasculature and interstitial markings. Less airspace opacity and volume loss at the lung bases. Stable right jugular porta catheter with its tip in the region of the superior cavoatrial junction or upper right atrium. Unremarkable bones. IMPRESSION: 1. Moderate to large-sized bilateral pleural effusions, significantly increased. 2. Progressive changes of congestive heart failure. 3. Mildly improved bibasilar atelectasis. Electronically Signed   By: Claudie Revering M.D.   On: 08/29/2022 14:17      Assessment/Plan Principal Problem:   Acute on chronic respiratory failure with hypoxia (HCC) Active Problems:   Acute on chronic diastolic CHF (congestive heart failure) (HCC)   CHF exacerbation (HCC)   Sepsis (HCC)   Recurrent pleural effusion    Hypothyroidism   Marginal zone lymphoma (HCC)   Diabetes mellitus without complication (HCC)   Normocytic anemia   Thrombocytopenia (HCC)   Assessment and Plan:  Acute on chronic respiratory failure with hypoxia: this is due to CHF and COPD exacerbation, and also recurrent bilateral pleural effusion.  Patient has 1+ leg edema, elevated BNP, crackles on auscultation, clinically consistent with CHF exacerbation.  Patient has coarse breathing sound and wheezing bilaterally, indicating COPD exacerbation.  Chest x-ray showed moderate to large size of bilateral pleural effusion.  -Admitted to PCU as inpatient -Bronchodilators -Solu-Medrol for COPD exacerbation -Thoracentesis for pleural effusion -IV diuretics for CHF -Nasal cannula oxygen to maintain oxygen saturation above 93%  Acute on chronic diastolic CHF (congestive heart failure) ([place]): 2D echo on 07/13/2022 showed EF acetic acid 5%.  Now has CHF exacerbation.  BNP 222. -Lasix 40 mg bid by IV -Daily weights -strict I/O's -Low salt diet -Fluid restriction -Obtain REDs Vest reading  COPD exacerbation and sepsis due to COPD: Patient meets criteria for sepsis with heart rate 101, RR 22.  Pending lactic acid level. -Solu-Medrol 40 mg IV bid -Z pak  -Mucinex for cough  -Incentive spirometry -sputum culture -Nasal cannula oxygen as needed to maintain O2 saturation 93% or greater -will not give IVF due to CHF exaceration -check procalcitonin level -Trend lactic acid level  Recurrent pleural effusion: Patient has bilateral pleural effusion, moderate to large size on both side. -Ordered thoracentesis  Hypothyroidism -Synthroid  Marginal zone lymphoma ([place]): This is newly diagnosis issue. Pt has not started chemo yet. -Consulted Dr. Janese Banks of oncology  Diabetes mellitus without complication [facility]): Recent A1c 5.5, well controlled.  Patient taking Novolin and 70/30 insulin 5 units twice daily -Sliding scale insulin -70/30 insulin 30  units twice daily  Normocytic anemia: Baseline hemoglobin 7-8.  Her hemoglobin is 7.3, at baseline -Follow-up with CBC  Thrombocytopenia [facility]): Platelet 83, no bleeding tendency.  This is chronic issue. -Follow-up with CBC      DVT ppx: SCD  Code Status: Full code per pt  Family Communication:   Yes, patient's daughter by phone  Disposition Plan:  Anticipate discharge back to previous environment  Consults called:  Dr. Janese Banks of oncology  Admission status and Level of care: Progressive:     as inpt      Dispo: The patient is from: Home              Anticipated d/c is to: Home              Anticipated d/c date is: 2 days              Patient currently is not medically stable to d/c.    Severity of Illness:  The appropriate patient status for this patient is INPATIENT. Inpatient status is judged to be reasonable and necessary in order to provide the required intensity of service to ensure the patient's safety. The patient's presenting symptoms, physical exam findings, and initial radiographic and laboratory data in the context of their chronic comorbidities is felt to place them at high risk for further clinical deterioration. Furthermore, it is not anticipated that the patient will be medically stable for discharge from the hospital within 2 midnights of admission.   * I certify that at the point of admission it is my clinical judgment that the patient will require inpatient hospital care spanning beyond 2 midnights from the point of admission due to high intensity of service, high risk for further deterioration and high frequency of surveillance required.*       Date of Service 08/29/2022    Ivor Costa [facility]   If 7PM-7AM, please contact night-coverage [email or web address] 08/29/2022, 5:32 PM

## 2022-08-29 NOTE — ED Notes (Signed)
Patient CBG 389.

## 2022-08-30 ENCOUNTER — Other Ambulatory Visit (HOSPITAL_COMMUNITY): Payer: Self-pay

## 2022-08-30 ENCOUNTER — Telehealth: Payer: Self-pay | Admitting: Pharmacist

## 2022-08-30 ENCOUNTER — Encounter: Payer: Self-pay | Admitting: Oncology

## 2022-08-30 ENCOUNTER — Inpatient Hospital Stay: Payer: Medicare Other

## 2022-08-30 ENCOUNTER — Telehealth: Payer: Self-pay

## 2022-08-30 ENCOUNTER — Encounter: Payer: Self-pay | Admitting: Internal Medicine

## 2022-08-30 DIAGNOSIS — J449 Chronic obstructive pulmonary disease, unspecified: Secondary | ICD-10-CM | POA: Diagnosis not present

## 2022-08-30 DIAGNOSIS — J90 Pleural effusion, not elsewhere classified: Secondary | ICD-10-CM

## 2022-08-30 DIAGNOSIS — Z515 Encounter for palliative care: Secondary | ICD-10-CM | POA: Diagnosis not present

## 2022-08-30 DIAGNOSIS — J9621 Acute and chronic respiratory failure with hypoxia: Secondary | ICD-10-CM | POA: Diagnosis not present

## 2022-08-30 DIAGNOSIS — C8580 Other specified types of non-Hodgkin lymphoma, unspecified site: Secondary | ICD-10-CM

## 2022-08-30 DIAGNOSIS — J441 Chronic obstructive pulmonary disease with (acute) exacerbation: Secondary | ICD-10-CM

## 2022-08-30 LAB — BODY FLUID CELL COUNT WITH DIFFERENTIAL
Eos, Fluid: 0 %
Lymphs, Fluid: 84 %
Monocyte-Macrophage-Serous Fluid: 15 %
Neutrophil Count, Fluid: 1 %
Total Nucleated Cell Count, Fluid: 1072 cu mm

## 2022-08-30 LAB — GLUCOSE, CAPILLARY: Glucose-Capillary: 126 mg/dL — ABNORMAL HIGH (ref 70–99)

## 2022-08-30 LAB — PROTEIN, PLEURAL OR PERITONEAL FLUID: Total protein, fluid: <3 g/dL

## 2022-08-30 LAB — CBG MONITORING, ED
Glucose-Capillary: 391 mg/dL — ABNORMAL HIGH (ref 70–99)
Glucose-Capillary: 277 mg/dL — ABNORMAL HIGH (ref 70–99)
Glucose-Capillary: 133 mg/dL — ABNORMAL HIGH (ref 70–99)

## 2022-08-30 LAB — ALBUMIN, PLEURAL OR PERITONEAL FLUID: Albumin, Fluid: <1.5 g/dL

## 2022-08-30 LAB — GLUCOSE, PLEURAL OR PERITONEAL FLUID: Glucose, Fluid: 331 mg/dL

## 2022-08-30 LAB — LACTATE DEHYDROGENASE, PLEURAL OR PERITONEAL FLUID: LD, Fluid: 129 U/L — ABNORMAL HIGH (ref 3–23)

## 2022-08-30 LAB — LACTATE DEHYDROGENASE: LDH: 215 U/L — ABNORMAL HIGH (ref 98–192)

## 2022-08-30 LAB — LACTIC ACID, PLASMA: Lactic Acid, Venous: 1.3 mmol/L (ref 0.5–1.9)

## 2022-08-30 LAB — MAGNESIUM: Magnesium: 1.8 mg/dL (ref 1.7–2.4)

## 2022-08-30 MED ORDER — INSULIN ASPART 100 UNIT/ML IJ SOLN
0.0000 [IU] | Freq: Three times a day (TID) | INTRAMUSCULAR | Status: DC
  Administered 2022-08-30: 9 [IU] via SUBCUTANEOUS
  Administered 2022-08-30: 3 [IU] via SUBCUTANEOUS
  Filled 2022-08-30 (×2): qty 1

## 2022-08-30 MED ORDER — INSULIN GLARGINE-YFGN 100 UNIT/ML ~~LOC~~ SOLN
5.0000 [IU] | Freq: Two times a day (BID) | SUBCUTANEOUS | Status: DC
  Administered 2022-08-30 – 2022-08-31 (×3): 5 [IU] via SUBCUTANEOUS
  Filled 2022-08-30 (×4): qty 0.05

## 2022-08-30 MED ORDER — PREDNISONE 50 MG PO TABS
50.0000 mg | ORAL_TABLET | Freq: Every day | ORAL | Status: DC
  Administered 2022-08-31: 50 mg via ORAL
  Filled 2022-08-30: qty 1

## 2022-08-30 MED ORDER — INSULIN ASPART 100 UNIT/ML IJ SOLN
0.0000 [IU] | Freq: Three times a day (TID) | INTRAMUSCULAR | Status: DC
  Administered 2022-08-30: 2 [IU] via SUBCUTANEOUS
  Administered 2022-08-31: 11 [IU] via SUBCUTANEOUS
  Administered 2022-08-31: 15 [IU] via SUBCUTANEOUS
  Filled 2022-08-30 (×3): qty 1

## 2022-08-30 MED ORDER — METHOCARBAMOL 500 MG PO TABS
500.0000 mg | ORAL_TABLET | Freq: Three times a day (TID) | ORAL | Status: DC | PRN
  Administered 2022-08-30: 500 mg via ORAL
  Filled 2022-08-30: qty 1

## 2022-08-30 MED ORDER — CALQUENCE 100 MG PO TABS
100.0000 mg | ORAL_TABLET | Freq: Two times a day (BID) | ORAL | 1 refills | Status: DC
  Filled 2022-08-30 – 2022-08-31 (×2): qty 60, 30d supply, fill #0
  Filled 2022-09-29: qty 60, 30d supply, fill #1

## 2022-08-30 MED ORDER — INSULIN ASPART 100 UNIT/ML IJ SOLN: 0.0000 [IU] | Freq: Every day | INTRAMUSCULAR | Status: DC

## 2022-08-30 MED ORDER — IPRATROPIUM-ALBUTEROL 0.5-2.5 (3) MG/3ML IN SOLN
3.0000 mL | Freq: Four times a day (QID) | RESPIRATORY_TRACT | Status: DC
  Administered 2022-08-31 (×3): 3 mL via RESPIRATORY_TRACT
  Filled 2022-08-30 (×3): qty 3

## 2022-08-30 MED ORDER — LIDOCAINE HCL (PF) 1 % IJ SOLN
10.0000 mL | Freq: Once | INTRAMUSCULAR | Status: AC
  Administered 2022-08-30: 10 mL via INTRADERMAL

## 2022-08-30 NOTE — Progress Notes (Signed)
Progress Note   Patient: Natasha [name] YBO:[id number] DOB: [birth year] DOA: 08/29/2022     1 DOS: the patient was seen and examined on 08/30/2022   Brief hospital course: Taken from H&P.  Natasha [name] is a 76 y.o. 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   Patient also has a history of recurrent pleural effusion secondary to lymphoma, recently underwent right-sided thoracentesis. She also endorsed some worsening of bilateral lower extremity edema.  Patient was found to have severe respiratory distress, cannot speak in full sentence. Patient initially had oxygen desaturation to 80s on home level 4 L oxygen, started on nonrebreather, then her oxygen saturation has improved to 96% on 4 L oxygen in ED.   Data reviewed independently and ED Course: pt was found to have BNP 222, troponin level 7, WBC 6.1, electrolytes renal function okay, negative COVID PCR, temperature normal, blood pressure 129/68, heart rate 101, 96, RR 22.  Chest x-ray showed worsening bilateral pleural effusion (moderate to large size) and pulmonary edema.  EKG: I have personally reviewed.  Sinus rhythm, QTc 453, low voltage, PVC.  Oncology was consulted and thoracentesis was ordered.  Per Oncology patient patient has very significant disease with extensive lymphadenopathy and should be started on treatment ASAP.  She has a port in place but has not started treatment due to poor health as well as transportation issues.  Patient was also very reluctant to start chemotherapy as she does not want to loose hairs.  Oncology is making some changes and might go with single oral agent as outpatient instead of having approved anti-B cell therapy.  11/6: Patient has pancytopenia secondary to lymphoma, oncology is recommending blood transfusions if hemoglobin drops below 7. CBG  elevated, did receive steroids yesterday.  Switching her to SSI and semglee 5 units twice daily and discontinuing 70/30 at this time. Patient had thoracentesis with removal of 1.2 L of amber-colored fluid flow-Labs sent. 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    Patient is very high risk for deterioration and mortality life limiting illness and are reluctant to do appropriate treatment.  She is currently full code.  Message sent to Dr. Janese Banks to involve cancer center palliative care.  Assessment and Plan: * Acute on chronic respiratory failure with hypoxia (HCC) Most likely multifactorial with recurrent pleural effusion and concern of COPD exacerbation.  Also concern of  CHF exacerbation.  Elevated BNP, crackles on auscultation and lower extremity edema. Initially requiring nonbreathing.  Now back to her baseline oxygen 4 L via S/p thoracentesis. -Continue with bronchodilators. -IV diuresis for CHF exacerbation -Continue supplemental oxygen  Acute on chronic diastolic CHF (congestive heart failure) ([place])  2D echo on 07/13/2022 showed EF 60-65%.  BNP mildly elevated at 222 -Lasix 40 mg bid by IV -Daily weights -strict I/O's -Low salt diet -Fluid restriction  CHF exacerbation [facility]) Patient meets criteria for sepsis with heart rate 101, RR 22. Some wheezing. Procalcitonin and lactic acid negative. -Received Solu-Medrol and now on prednisone  -Continue with Z-Pak -Mucinex for cough  -Incentive spirometry -sputum culture -Nasal cannula oxygen as needed to maintain O2 saturation 93% or greate  Recurrent pleural effusion Most likely secondary to underlying malignancy. S/p thoracentesis. -Follow-up thoracentesis labs  Marginal zone lymphoma [facility]) Patient with significant disease burden but has not started the chemo yet. Missed multiple outpatient oncology  appointments either being in the hospital for worsening respiratory status or  transportation issues. Patient had a port in place but does not want chemotherapy stating that she does not want to loose her hairs. -Oncology was consulted -Palliative care consult-oncology with consult cancer center palliative  Hypothyroidism -Continue Synthroid  Diabetes mellitus without complication (HCC) Elevated CBG , most likely due to steroid as recent A1c was 5.5. Patient was on 70/30 at home. -Starting her on Semglee -SSI  Normocytic anemia Baseline hemoglobin 7-8.  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 Patient has pancytopenia. -Continue to monitor   Subjective: Patient continued to feel short of breath and coughing when seen during morning rounds.  Had a lengthy discussion regarding chemotherapy but she was very adamant that she does not want to listen the word chemotherapy and asking for some of the alternate treatment so She will not lose any hairs.  Physical Exam: Vitals:   08/30/22 0600 08/30/22 1000 08/30/22 1141 08/30/22 1400  BP: (!) 103/59 108/62 (!) 104/50 (!) 110/55  Pulse: 81 84 92 87  Resp: '16 18  20  '$ Temp:  98.7 F (37.1 C)  98.7 F (37.1 C)  TempSrc:  Oral  Oral  SpO2: 95% 94% 95% 96%  Weight:      Height:       General.  Chronically ill-appearing lady, in no acute distress. Pulmonary.  Bilateral scattered crackles with decreased bibasilar breath sounds, mildly increased work of breathing. CV.  Regular rate and rhythm, no JVD, rub or murmur. Abdomen.  Soft, nontender, nondistended, BS positive. CNS.  Alert and oriented .  No focal neurologic deficit. Extremities.  2+ LE edema, no cyanosis, pulses intact and symmetrical. Psychiatry.  Appears to have some cognitive impairment  Data Reviewed: Prior data reviewed  Family Communication: Discussed with daughter on phone.  Disposition: Status is: Inpatient Remains inpatient  appropriate because: Severity of illness  Planned Discharge Destination: Home with Home Health   Time spent: 50 minutes  This record has been created using Systems analyst. Errors have been sought and corrected,but may not always be located. Such creation errors do not reflect on the standard of care.   Author: Lorella Nimrod, MD 08/30/2022 2:53 PM  For on call review [email or web address].

## 2022-08-30 NOTE — Assessment & Plan Note (Signed)
Patient meets criteria for sepsis with heart rate 101, RR 22. Some wheezing. Procalcitonin and lactic acid negative. -Received Solu-Medrol and now on prednisone  -Continue with Z-Pak -Mucinex for cough  -Incentive spirometry -sputum culture -Nasal cannula oxygen as needed to maintain O2 saturation 93% or greate

## 2022-08-30 NOTE — Assessment & Plan Note (Signed)
Patient with significant disease burden but has not started the chemo yet. Missed multiple outpatient oncology appointments either being in the hospital for worsening respiratory status or transportation issues. Patient had a port in place but does not want chemotherapy stating that she does not want to loose her hairs. -Oncology was consulted -Palliative care consult-oncology with consult cancer center palliative

## 2022-08-30 NOTE — Progress Notes (Signed)
Chaplain met with pt per consult. Pt expressed frustration around her medical journey, she wants to get better and feels like every time she starts to feel better something else happens. Pt expressed wanting to fill out Advance directive paperwork. She wanted to wait for daughter to be present to help go through it. She shared previously she has asked her daughter to be her health care power of attorney, but wants it in writing. This chaplain will leave word for next on call chaplain to follow up.

## 2022-08-30 NOTE — ED Notes (Signed)
Patient CBG 227.

## 2022-08-30 NOTE — Assessment & Plan Note (Signed)
Seems chronic, most likely secondary to lymphoma. Patient has pancytopenia. -Continue to monitor

## 2022-08-30 NOTE — Assessment & Plan Note (Signed)
Continue Synthroid °

## 2022-08-30 NOTE — Assessment & Plan Note (Signed)
Most likely multifactorial with recurrent pleural effusion and concern of COPD exacerbation.  Also concern of  CHF exacerbation.  Elevated BNP, crackles on auscultation and lower extremity edema. Initially requiring nonbreathing.  Now back to her baseline oxygen 4 L via S/p thoracentesis. -Continue with bronchodilators. -IV diuresis for CHF exacerbation -Continue supplemental oxygen

## 2022-08-30 NOTE — Procedures (Signed)
PROCEDURE SUMMARY:  Successful US guided diagnostic and therapeutic right thoracentesis. Yielded 1.2 L of hazy, amber colored fluid. Pt tolerated procedure well. No immediate complications.  Specimen was sent for labs. CXR ordered.  EBL < 1 mL  Tyson Alias, AGNP 08/30/2022 12:13 PM

## 2022-08-30 NOTE — Discharge Instructions (Signed)

## 2022-08-30 NOTE — Assessment & Plan Note (Signed)
Most likely secondary to underlying malignancy. S/p thoracentesis. -Follow-up thoracentesis labs

## 2022-08-30 NOTE — Assessment & Plan Note (Signed)
Elevated CBG , most likely due to steroid as recent A1c was 5.5. Patient was on 70/30 at home. -Starting her on Semglee -SSI

## 2022-08-30 NOTE — Assessment & Plan Note (Signed)
2D echo on 07/13/2022 showed EF 60-65%.  BNP mildly elevated at 222 -Lasix 40 mg bid by IV -Daily weights -strict I/O's -Low salt diet -Fluid restriction

## 2022-08-30 NOTE — Hospital Course (Addendum)
Taken from H&P.  Natasha [name] is a 76 y.o. 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   Patient also has a history of recurrent pleural effusion secondary to lymphoma, recently underwent right-sided thoracentesis. She also endorsed some worsening of bilateral lower extremity edema.  Patient was found to have severe respiratory distress, cannot speak in full sentence. Patient initially had oxygen desaturation to 80s on home level 4 L oxygen, started on nonrebreather, then her oxygen saturation has improved to 96% on 4 L oxygen in ED.   Data reviewed independently and ED Course: pt was found to have BNP 222, troponin level 7, WBC 6.1, electrolytes renal function okay, negative COVID PCR, temperature normal, blood pressure 129/68, heart rate 101, 96, RR 22.  Chest x-ray showed worsening bilateral pleural effusion (moderate to large size) and pulmonary edema.  EKG: I have personally reviewed.  Sinus rhythm, QTc 453, low voltage, PVC.  Oncology was consulted and thoracentesis was ordered.  Per Oncology patient patient has very significant disease with extensive lymphadenopathy and should be started on treatment ASAP.  She has a port in place but has not started treatment due to poor health as well as transportation issues.  Patient was also very reluctant to start chemotherapy as she does not want to loose hairs.  Oncology is making some changes and might go with single oral agent as outpatient instead of having approved anti-B cell therapy.  11/6: Patient has pancytopenia secondary to lymphoma, oncology is recommending blood transfusions if hemoglobin drops below 7. CBG elevated, did receive steroids yesterday.  Switching her to SSI and semglee 5 units twice daily and discontinuing 70/30 at this time. Patient had thoracentesis with removal of  1.2 L of amber-colored fluid flow-Labs sent. 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   Patient is very high risk for deterioration and mortality life limiting illness and are reluctant to do appropriate treatment.  She is currently full code.  Message sent to Dr. Janese Banks to involve cancer center palliative care.  11/7: Palliative care has a discussion with patient and now she agrees to proceed with IV cancer treatment with less hair loss.  Saturating well on 3 L of oxygen. Chaplain was also consulted to complete POA Paperwork, she wants her daughter to be her POA.  Oncology is planning to start her on p.o. therapy with less hair loss. Patient is stable and her baseline from respiratory standpoint. She is being given 3 more days of Zithromax and prednisone.  She will continue on current medications and need to have a close follow-up with her providers for further recommendations.  Patient will remain high risk for readmission, deterioration and mortality.  Palliative care from cancer center is also following.

## 2022-08-30 NOTE — Telephone Encounter (Signed)
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 Due to performance status, patient is not considered a candidate for bendamustine + rituxumab at this time.   CBC from 08/29/22 assessed, patient has baseline neutropenia and thrombocytopenia, will continue to monitor. Prescription dose and frequency assessed.   Current medication list in Epic reviewed, no DDIs with acalabrutinib identified.  Evaluated chart and no patient barriers to medication adherence identified.   Prescription has been e-scribed to the [facility] for benefits analysis and approval.  Oral Oncology Clinic will continue to follow for insurance authorization, copayment issues, initial counseling and start date.   Darl Pikes, PharmD, BCPS, BCOP, CPP Hematology/Oncology Clinical Pharmacist Practitioner [place]/DB/AP Oral [facility] [phone]  08/30/2022 10:27 AM

## 2022-08-30 NOTE — Telephone Encounter (Signed)
Oral Oncology Pharmacist Encounter   Prior Authorization for Calquence has been approved.     PA# [id number] Effective dates: 08/30/22 through 10/25/23   Oral Oncology Clinic will continue to follow.   Darl Pikes, PharmD, BCPS. BCOP Hematology/Oncology Clinical Pharmacist [facility]/HP/AP Oral [facility] [phone]  08/30/2022 10:20 AM

## 2022-08-30 NOTE — Patient Outreach (Unsigned)
  Care Coordination   Patient care coordination  Visit Note   08/30/2022 Name: Natasha [name] MRN: [id number] DOB: 1946/07/22  Natasha [name] is a 76 y.o. year old female who sees [name], NP for primary care. Patient care coordination   What matters to the patients health and wellness today?  Patient admitted to the hospital on today 08/30/22.  Will collaborate with inpatient team as needed.     Goals Addressed             This Visit's Progress    Patient / caregiver stated:  Management of health conditions       Care Coordination Interventions: Collaboration with Royann Shivers, Director and Berniece Andreas, Senior clinical Arts administrator regarding home health.             SDOH assessments and interventions completed:  No{THN Tip this will not be part of the note when signed-REQUIRED REPORT FIELD DO NOT DELETE (Optional):27901}     Care Coordination Interventions Activated:  Yes {THN Tip this will not be part of the note when signed-REQUIRED REPORT FIELD DO NOT DELETE (Optional):27901} Care Coordination Interventions:  Yes, provided {THN Tip this will not be part of the note when signed-REQUIRED REPORT FIELD DO NOT DELETE (Optional):27901}  Follow up plan: Follow up call scheduled for as previously scheduled    Encounter Outcome:  Pt. Visit Completed {THN Tip this will not be part of the note when signed-REQUIRED REPORT FIELD DO NOT DELETE (Optional):27901}

## 2022-08-30 NOTE — Telephone Encounter (Signed)
Oral Oncology Pharmacist Encounter   Received notification from [employer] that prior authorization for Calquence is required.   PA submitted on CMM Key [id number]  Status is pending   Oral Oncology Clinic will continue to follow.   Darl Pikes, PharmD, BCPS, [facility] Hematology/Oncology Clinical Pharmacist [facility]/HP Oral [place] [facility] [phone]  08/30/2022 10:19 AM

## 2022-08-30 NOTE — Progress Notes (Signed)
Nutrition Brief Note  RD pulled to chart secondary to CHF.   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    Pt admitted with acute respiratory failure with hypoxia secondary to COPD and CHF exacerbations.   Pt familiar to this RD due to recent admission. She was provided diet education at that time as well as referred to [facility]'s Nutrition and Diabetes Education Services.   RD provided "Low Sodium Nutrition Therapy" handout from [facility] Nutrition Care Manual. Attached to AVS/ Discharge summary.   Current diet order is 2 gram sodium, patient is consuming approximately n/a% of meals at this time. Labs and medications reviewed.   No nutrition interventions warranted at this time. If nutrition issues arise, please consult RD.   Natasha [name], RD, LDN, [place] Registered Dietitian II Certified Diabetes Care and Education Specialist Please refer to [facility] for RD and/or RD on-call/weekend/after hours pager

## 2022-08-30 NOTE — Consult Note (Signed)
[place] at [facility] [phone] [phone]   Name: Natasha [name] Date: 08/30/2022 MRN: [id number]  DOB: June 15, 1946  Patient Care Team: [name], NP as PCP - General (Nurse Practitioner) [name], RN (Inactive) as Case Manager [name], RN as [place] Management    REASON FOR CONSULTATION: Natasha [name] is a 76 y.o. female with multiple medical problems including COPD, chronic respiratory failure on 3 L O2, diabetes, recurrent pleural effusion, and recently diagnosed marginal zone lymphoma.  Patient has multiple recent hospitalizations for acute on chronic hypoxic respiratory failure secondary to recurrent pleural effusion.  She was recently diagnosed with marginal zone lymphoma but had difficulty following up outpatient to begin cancer treatment due to recurrent hospitalizations.  Patient is now hospitalized 08/29/2022 again with acute on chronic hypoxic respiratory failure with increased bilateral pleural effusions.  Palliative care was consulted to help address goals.  SOCIAL HISTORY:     reports that she quit smoking about 4 weeks ago. Her smoking use included cigarettes. She started smoking about 61 years ago. She has a 110.00 pack-year smoking history. She has never used smokeless tobacco. She reports that she does not drink alcohol and does not use drugs.  Patient is widowed.  She lives at home with a friend/caregiver, [name].  She has a daughter who is involved in her care.  She also has a son who lives nearby.  Patient worked at [employer]  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; [name]; 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[place]  09/08/2018 Initial Diagnosis   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[place]   Marginal zone lymphoma ([place]  07/28/2022 Initial Diagnosis   Marginal zone lymphoma ([place]   08/01/2022 Cancer Staging   Staging form: Hodgkin and Non-Hodgkin Lymphoma, AJCC 8th Edition - Clinical stage from 08/01/2022: Stage IV (Marginal zone lymphoma) - Signed by RSindy Guadeloupe MD on 08/01/2022   08/17/2022 -  Chemotherapy   Patient is on Treatment Plan : NON-HODGKINS LYMPHOMA Rituximab D1 + Bendamustine D1,2 q28d x 6 cycles       ALLERGIES:  has No Known Allergies.  MEDICATIONS:  Current Facility-Administered Medications  Medication Dose Route Frequency Provider Last Rate Last Admin   acetaminophen (TYLENOL) tablet 650 mg  650 mg Oral Q6H PRN NIvor Costa MD       albuterol (PROVENTIL) (2.5 MG/3ML) 0.083% nebulizer  solution 2.5 mg  2.5 mg Nebulization Q4H PRN Ivor Costa, MD       azithromycin [facility]) tablet 250 mg  250 mg Oral Daily Ivor Costa, MD   250 mg at 08/30/22 1610   clobetasol cream (TEMOVATE) 9.60 % 1 Application  1 Application Topical Daily Ivor Costa, MD   1 Application at 45/40/98 1191   dextromethorphan-guaiFENesin (Tajique DM) 30-600 MG per  12 hr tablet 1 tablet  1 tablet Oral BID PRN Ivor Costa, MD       furosemide (LASIX) injection 40 mg  40 mg Intravenous Q12H Ivor Costa, MD   40 mg at 08/30/22 4782   HYDROcodone-acetaminophen (NORCO/VICODIN) 5-325 MG per tablet 1 tablet  1 tablet Oral Q8H PRN Ivor Costa, MD   1 tablet at 08/30/22 1245   insulin aspart (novoLOG) injection 0-15 Units  0-15 Units Subcutaneous TID WC Amin, Soundra Pilon, MD       insulin aspart (novoLOG) injection 0-5 Units  0-5 Units Subcutaneous QHS Lorella Nimrod, MD       insulin glargine-yfgn (SEMGLEE) injection 5 Units  5 Units Subcutaneous BID Lorella Nimrod, MD   5 Units at 08/30/22 0950   ipratropium-albuterol (DUONEB) 0.5-2.5 (3) MG/3ML nebulizer solution 3 mL  3 mL Nebulization Q4H Ivor Costa, MD   3 mL at 08/30/22 1242   levothyroxine (SYNTHROID) tablet 50 mcg  50 mcg Oral Q0600 Ivor Costa, MD   50 mcg at 08/30/22 0549   loperamide (IMODIUM) capsule 2 mg  2 mg Oral PRN Ivor Costa, MD       ondansetron [facility]) injection 4 mg  4 mg Intravenous Q8H PRN Ivor Costa, MD       Derrill Memo ON 08/31/2022] predniSONE (DELTASONE) tablet 50 mg  50 mg Oral Q breakfast Lorella Nimrod, MD       simethicone (MYLICON) chewable tablet 200 mg  200 mg Oral Q6H PRN Ivor Costa, MD       Current Outpatient Medications  Medication Sig Dispense Refill   clobetasol cream (TEMOVATE) 9.56 % Apply 1 Application topically daily.     insulin NPH-regular Human (70-30) 100 UNIT/ML injection Inject 5 Units into the skin 2 (two) times daily with a meal. Use only for hyperglycemia, CBG+ 300. Start with 5 units, titrate up to max 10 unit per dose if need. (Patient taking differently: Inject 10 Units into the skin 2 (two) times daily with a meal. Use only for hyperglycemia, CBG+ 300. Start with 5 units, titrate up to max 10 unit per dose if need.) 10 mL 0   insulin regular (NOVOLIN R) 100 units/mL injection Inject 5-10 Units into the skin 3 (three) times daily before meals. BG 200-299 give 5 units BG  300-399 give 10 units.     Ipratropium-Albuterol (COMBIVENT RESPIMAT) 20-100 MCG/ACT AERS respimat Inhale 1 puff into the lungs 3 (three) times daily.     levothyroxine (SYNTHROID) 50 MCG tablet Take 1 tablet (50 mcg total) by mouth daily at 6 (six) AM. 30 tablet 0   acalabrutinib maleate (CALQUENCE) 100 MG tablet Take 1 tablet (100 mg total) by mouth 2 (two) times daily. 60 tablet 1   acyclovir (ZOVIRAX) 400 MG tablet Take 400 mg by mouth daily.     albuterol (VENTOLIN HFA) 108 (90 Base) MCG/ACT inhaler TAKE 2 PUFFS BY MOUTH EVERY 6 HOURS AS NEEDED FOR WHEEZE OR SHORTNESS OF BREATH 18 g 1   HYDROcodone-acetaminophen (NORCO) 5-325 MG tablet Take 1 tablet by mouth every 8 (eight) hours as needed  for moderate pain. 15 tablet 0   lidocaine-prilocaine (EMLA) cream Apply to affected area once (Patient taking differently: 1 Application. Apply to mediport site 2 hours before infusion) 30 g 3   loperamide (IMODIUM) 2 MG capsule Take 2 mg by mouth as needed for diarrhea or loose stools.     ondansetron (ZOFRAN) 8 MG tablet Take 8 mg by mouth every 8 (eight) hours as needed for nausea, vomiting or refractory nausea / vomiting. Take 1 tablet q6H for 4 days after chemotherapy.     predniSONE (DELTASONE) 20 MG tablet Take 20 mg by mouth daily with breakfast. (Patient not taking: Reported on 08/29/2022)     prochlorperazine (COMPAZINE) 10 MG tablet Take 1 tablet by mouth every 6 (six) hours as needed for nausea or vomiting. Take 1 tab q6H for 4 days after chemotherapy.     simethicone (MYLICON) 80 MG chewable tablet Chew 180 mg by mouth every 6 (six) hours as needed for flatulence. Take 1 capsule PO PRN gas relief.      VITAL SIGNS: BP (!) 110/55 (BP Location: Right Arm)   Pulse 87   Temp 98.7 F (37.1 C) (Oral)   Resp 20   Ht '5\' 5"'$  (1.651 m)   Wt 140 lb (63.5 kg)   SpO2 96%   BMI 23.30 kg/m  Filed Weights   08/29/22 1541  Weight: 140 lb (63.5 kg)    Estimated body mass index is 23.3 kg/m as  calculated from the following:   Height as of this encounter: '5\' 5"'$  (1.651 m).   Weight as of this encounter: 140 lb (63.5 kg).  LABS: CBC:    Component Value Date/Time   WBC 4.1 08/29/2022 1325   HGB 7.3 (L) 08/29/2022 1325   HGB 8.0 (L) 06/22/2022 0939   HCT 24.9 (L) 08/29/2022 1325   HCT 26.6 (L) 06/22/2022 0939   PLT 83 (L) 08/29/2022 1325   PLT 77 (LL) 06/22/2022 0939   MCV 92.6 08/29/2022 1325   MCV 93 06/22/2022 0939   NEUTROABS 0.6 (L) 08/29/2022 1325   NEUTROABS 0.4 (LL) 06/22/2022 0939   LYMPHSABS 2.8 08/29/2022 1325   LYMPHSABS 3.1 06/22/2022 0939   MONOABS 0.6 08/29/2022 1325   EOSABS 0.0 08/29/2022 1325   EOSABS 0.1 06/22/2022 0939   BASOSABS 0.1 08/29/2022 1325   BASOSABS 0.0 06/22/2022 0939   Comprehensive Metabolic Panel:    Component Value Date/Time   NA 141 08/29/2022 1325   NA 143 06/22/2022 0939   K 4.4 08/29/2022 1325   CL 107 08/29/2022 1325   CO2 29 08/29/2022 1325   BUN 33 (H) 08/29/2022 1325   BUN 21 06/22/2022 0939   CREATININE 0.68 08/29/2022 1325   CREATININE 0.92 07/07/2018 0955   GLUCOSE 140 (H) 08/29/2022 1325   CALCIUM 9.5 08/29/2022 1325   AST 26 08/29/2022 1325   ALT 14 08/29/2022 1325   ALKPHOS 174 (H) 08/29/2022 1325   BILITOT 1.6 (H) 08/29/2022 1325   BILITOT 2.0 (H) 06/22/2022 0939   PROT 5.4 (L) 08/29/2022 1325   PROT 4.9 (L) 06/22/2022 0939   ALBUMIN 3.1 (L) 08/29/2022 1325   ALBUMIN 3.4 (L) 06/22/2022 0939    RADIOGRAPHIC STUDIES: DG Chest Port 1 View  Result Date: 08/30/2022 CLINICAL DATA:  Post thoracentesis.  RIGHT. EXAM: PORTABLE CHEST 1 VIEW COMPARISON:  IR ultrasound, earlier same day. Chest XR, 08/29/2022. FINDINGS: Support lines: Stable positioning of RIGHT chest port with catheter tip at the superior cavoatrial junction. Overlying pacer leads. Cardiac  apex is obscured. Mediastinal silhouette is within normal limits. Lungs are hypoinflated with perihilar opacities and bibasilar consolidations. Interval removal of  RIGHT pleural effusion, with small volume residual. Small-to-moderate volume LEFT pleural effusion. No pneumothorax. No interval osseous abnormality. IMPRESSION: 1. No pneumothorax status post RIGHT thoracentesis. 2. Bilateral pleural effusions and basilar consolidations, likely atelectasis. Electronically Signed   By: Michaelle Birks M.D.   On: 08/30/2022 12:29   US THORACENTESIS ASP PLEURAL SPACE W/IMG GUIDE  Result Date: 08/30/2022 INDICATION: History of gastric lymphoma with recurrent pleural effusions. Patient presented to ED with complaint of shortness of breath found to have bilateral pleural effusions. Upon ultrasound exam right greater than left. EXAM: ULTRASOUND GUIDED DIAGNOSTIC AND THERAPEUTIC RIGHT THORACENTESIS MEDICATIONS: 10 mL 1 % lidocaine COMPLICATIONS: None immediate. PROCEDURE: An ultrasound guided thoracentesis was thoroughly discussed with the patient and questions answered. The benefits, risks, alternatives and complications were also discussed. The patient understands and wishes to proceed with the procedure. Written consent was obtained. Ultrasound was performed to localize and mark an adequate pocket of fluid in the RIGHT chest. The area was then prepped and draped in the normal sterile fashion. 1% Lidocaine was used for local anesthesia. Under ultrasound guidance a 6 Fr Safe-T-Centesis catheter was introduced. Thoracentesis was performed. The catheter was removed and a dressing applied. FINDINGS: A total of approximately 1.2 L of hazy, amber colored fluid was removed. Samples were sent to the laboratory as requested by the clinical team. IMPRESSION: Successful ultrasound guided right thoracentesis yielding 1.2 L of pleural fluid. Read by: Narda Rutherford, AGNP-BC Electronically Signed   By: Michaelle Birks M.D.   On: 08/30/2022 12:25   DG Chest Portable 1 View  Result Date: 08/29/2022 CLINICAL DATA:  Shortness of breath. EXAM: PORTABLE CHEST 1 VIEW COMPARISON:  08/17/2022 FINDINGS: Moderate  to large-sized bilateral pleural effusions, significantly increased. Progressive increase in prominence of the pulmonary vasculature and interstitial markings. Less airspace opacity and volume loss at the lung bases. Stable right jugular porta catheter with its tip in the region of the superior cavoatrial junction or upper right atrium. Unremarkable bones. IMPRESSION: 1. Moderate to large-sized bilateral pleural effusions, significantly increased. 2. Progressive changes of congestive heart failure. 3. Mildly improved bibasilar atelectasis. Electronically Signed   By: Claudie Revering M.D.   On: 08/29/2022 14:17   US THORACENTESIS ASP PLEURAL SPACE W/IMG GUIDE  Result Date: 08/17/2022 INDICATION: Patient with history of gastric lymphoma with recurrent pleural effusions. Request is for therapeutic thoracentesis. EXAM: ULTRASOUND GUIDED THERAPEUTIC RIGHT-SIDED THORACENTESIS MEDICATIONS: Lidocaine 1% 10 mL COMPLICATIONS: None immediate. PROCEDURE: An ultrasound guided thoracentesis was thoroughly discussed with the patient and questions answered. The benefits, risks, alternatives and complications were also discussed. The patient understands and wishes to proceed with the procedure. Written consent was obtained. Ultrasound was performed to localize and mark an adequate pocket of fluid in the RIGHT chest. The area was then prepped and draped in the normal sterile fashion. 1% Lidocaine was used for local anesthesia. Under ultrasound guidance a 6 Fr Safe-T-Centesis catheter was introduced. Thoracentesis was performed. The catheter was removed and a dressing applied. FINDINGS: A total of approximately 850 mL of straw-colored fluid was removed. IMPRESSION: Successful ultrasound guided therapeutic RIGHT-sided thoracentesis yielding 850 mL of pleural fluid. Read by: Rushie Nyhan, NP Electronically Signed   By: Michaelle Birks M.D.   On: 08/17/2022 13:23   DG Chest Port 1 View  Result Date: 08/17/2022 CLINICAL DATA:   Post right-sided thoracentesis EXAM: PORTABLE CHEST 1 VIEW  COMPARISON:  08/16/2022; 08/03/2022; chest CT-07/12/2022 FINDINGS: Grossly unchanged enlarged cardiac silhouette and mediastinal contours. Stable positioning of support apparatus. Interval reduction in persistent small right-sided effusion post thoracentesis. No pneumothorax. Improved aeration the right lung base with persistent right basilar heterogeneous/consolidative opacities. Unchanged small left-sided effusion associated left basilar opacities. Pulmonary vasculature remains indistinct with cephalization of flow. No acute osseous abnormalities. IMPRESSION: 1. Interval reduction in persistent small right-sided effusion post thoracentesis. No pneumothorax. 2. Improved aeration the right lung base with otherwise similar findings of pulmonary edema, small left-sided effusion and associated bibasilar opacities, likely atelectasis. Electronically Signed   By: Sandi Mariscal M.D.   On: 08/17/2022 13:12   DG Chest Port 1 View  Result Date: 08/16/2022 CLINICAL DATA:  Shortness of breath. EXAM: PORTABLE CHEST 1 VIEW COMPARISON:  08/03/2022 FINDINGS: Right jugular Port-A-Cath with the tip in the lower SVC region. Markedly increased densities in the mid and lower right chest that could represent a large pleural fluid and/or airspace disease. Again noted are prominent lung markings bilaterally which are similar to the previous examination. Negative for pneumothorax. Heart size is grossly stable. Slightly increased densities at the left lung base could represent atelectasis or pleural fluid. IMPRESSION: 1. Markedly increased densities in the mid and lower right chest. Findings could represent a large pleural effusion and/or airspace disease. 2. Prominent lung markings bilaterally are similar to the previous examination and could represent vascular congestion or pulmonary edema. Electronically Signed   By: Markus Daft M.D.   On: 08/16/2022 15:52   IR IMAGING GUIDED  PORT INSERTION  Result Date: 08/16/2022 INDICATION: pt has Marginal zone lymphoma and needs port insertion for treatment EXAM: Chest port placement using ultrasound and fluoroscopic guidance MEDICATIONS: As documented in EMR ANESTHESIA/SEDATION: Moderate (conscious) sedation was employed during this procedure. A total of Versed 1 mg and Fentanyl 50 mcg was administered intravenously. Moderate Sedation Time: 25 minutes. The patient's level of consciousness and vital signs were monitored continuously by radiology nursing throughout the procedure under my direct supervision. FLUOROSCOPY TIME:  Fluoroscopy Time: 0.5 minutes (5 mGy) COMPLICATIONS: None immediate. PROCEDURE: Informed written consent was obtained from the patient after a thorough discussion of the procedural risks, benefits and alternatives. All questions were addressed. Maximal Sterile Barrier Technique was utilized including caps, mask, sterile gowns, sterile gloves, sterile drape, hand hygiene and skin antiseptic. A timeout was performed prior to the initiation of the procedure. The patient was placed supine on the exam table. The right neck and chest was prepped and draped in the standard sterile fashion. A preliminary ultrasound of the right neck was performed and demonstrates a patent right internal jugular vein. A permanent ultrasound image was stored in the electronic medical record. The overlying skin was anesthetized with 1% Lidocaine. Using ultrasound guidance, access was obtained into the right internal jugular vein using a 21 gauge micropuncture set. A wire was advanced into the SVC, a short incision was made at the puncture site, and serial dilatation performed. Next, in an ipsilateral infraclavicular location, an incision was made at the site of the subcutaneous reservoir. Blunt dissection was used to open a pocket to contain the reservoir. A subcutaneous tunnel was then created from the port site to the puncture site. A(n) 8 Fr single  lumen catheter was advanced through the tunnel. The catheter was attached to the port and this was placed in the subcutaneous pocket. Under fluoroscopic guidance, a peel away sheath was placed, and the catheter was trimmed to the appropriate length and  was advanced into the central veins. The catheter length is 24 cm. The tip of the catheter lies near the superior cavoatrial junction. The port flushes and aspirates appropriately. The port was flushed and locked with heparinized saline. The port pocket was closed in 2 layers using 3-0 and 4-0 Vicryl/absorbable suture. Dermabond was also applied to both incisions. The patient was transferred to recovery in stable condition. IMPRESSION: Successful placement of a right chest port via the right internal jugular vein. The port is ready for immediate use. Electronically Signed   By: Albin Felling M.D.   On: 08/16/2022 15:11   US THORACENTESIS ASP PLEURAL SPACE W/IMG GUIDE  Result Date: 08/03/2022 INDICATION: Patient with a history of gastric lymphoma with recurrent pleural effusions. Interventional radiology asked to perform a diagnostic and therapeutic thoracentesis. EXAM: ULTRASOUND GUIDED RIGHT THORACENTESIS MEDICATIONS: 1% lidocaine 10 mL COMPLICATIONS: None immediate. PROCEDURE: An ultrasound guided thoracentesis was thoroughly discussed with the patient and questions answered. The benefits, risks, alternatives and complications were also discussed. The patient understands and wishes to proceed with the procedure. Written consent was obtained. Ultrasound was performed to localize and mark an adequate pocket of fluid in the right chest. The area was then prepped and draped in the normal sterile fashion. 1% Lidocaine was used for local anesthesia. Under ultrasound guidance a 6 Fr Safe-T-Centesis catheter was introduced. Thoracentesis was performed. The catheter was removed and a dressing applied. FINDINGS: A total of approximately 1 L of clear yellow fluid was  removed. Samples were sent to the laboratory as requested by the clinical team. IMPRESSION: Successful ultrasound guided right thoracentesis yielding 1 L of pleural fluid. Read by: Soyla Dryer, NP Electronically Signed   By: Ruthann Cancer M.D.   On: 08/03/2022 13:01   DG Chest Port 1 View  Result Date: 08/03/2022 CLINICAL DATA:  Status post thoracentesis. EXAM: PORTABLE CHEST 1 VIEW COMPARISON:  08/02/2022 FINDINGS: 1151 hours. No evidence for pneumothorax. Cardiopericardial silhouette is at upper limits of normal for size. Vascular congestion with diffuse interstitial opacity. There is bibasilar atelectasis with small bilateral pleural effusions, decreased in the interval. Telemetry leads overlie the chest. IMPRESSION: 1. No evidence for pneumothorax status post thoracentesis. 2. Interval decrease in bilateral pleural effusions. Electronically Signed   By: Misty Stanley M.D.   On: 08/03/2022 12:07   DG Chest 2 View  Result Date: 08/02/2022 CLINICAL DATA:  Shortness of breath EXAM: CHEST - 2 VIEW COMPARISON:  Chest x-ray dated July 13, 2022 FINDINGS: Visualized cardiac and mediastinal contours are unchanged. Right lung granuloma. Moderate bilateral pleural effusions and bibasilar atelectasis. No evidence of pneumothorax. IMPRESSION: Moderate bilateral pleural effusions and bibasilar atelectasis, similar to prior radiograph. Electronically Signed   By: Yetta Glassman M.D.   On: 08/02/2022 16:46    PERFORMANCE STATUS (ECOG) : 2 - Symptomatic, <50% confined to bed  Review of Systems Unless otherwise noted, a complete review of systems is negative.  Physical Exam General: NAD Cardiovascular: regular rate and rhythm Pulmonary: clear ant fields Abdomen: soft, nontender, + bowel sounds GU: no suprapubic tenderness Extremities: no edema, no joint deformities Skin: no rashes Neurological: Weakness but otherwise nonfocal  IMPRESSION: Patient seen in the ED.  Breathing is somewhat improved  after patient received right-sided thoracentesis earlier today yielding 1.2 L of fluid.  Patient has been seen by medical oncology given recent diagnosis of marginal zone lymphoma.  Patient has had difficulty keeping follow-up appointments due to recurrent hospitalizations.  This is patient's fourth hospitalization in 1.5  months.  Patient says that she recognizes that she has a cancer diagnosis and that her many of her current medical problems stem from that.  She has previously voiced reluctance with "chemotherapy" and so option for oral treatments were pursued.    Patient says that she has watched many people in her life undergo "chemotherapy" and then die soon after initiating treatment.  She says that she is fearful that chemotherapy would be associated with adverse effects causing further decline.  However, today patient says that she is concerned that oral treatment would take too long to be effective and the she is interested in pursuing systemic options such as rituximab and Bendamustine.  She says that her goal is to "get well."  At baseline, patient says that she lives at home with a caregiver who assists her when needed.  However, she says that she is mostly independent with her own self-care.  Patient does not have any advance directives but is interested in establishing those.  She would want her daughter to be her decision-maker if needed.  Patient states that she wants to remain a full code.  PLAN: -Continue current scope of treatment -Patient is interested in starting systemic cancer treatment -Chaplain consult to assist with ACP -Full code  Case and plan discussed with Dr. Janese Banks   Time Total: 60 minutes  Visit consisted of counseling and education dealing with the complex and emotionally intense issues of symptom management and palliative care in the setting of serious and potentially life-threatening illness.Greater than 50%  of this time was spent counseling and coordinating  care related to the above assessment and plan.  Signed by: Altha Harm, PhD, NP-C

## 2022-08-30 NOTE — Assessment & Plan Note (Addendum)
Baseline hemoglobin 7-8.  Her hemoglobin is 7.3, at baseline -Follow-up with CBC -Transfuse if below 7

## 2022-08-30 NOTE — Consult Note (Signed)
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  Mild mitral regurgitation.  Mild to moderate tricuspid regurgitation.  Elevated pulmonary artery systolic pressures.  She presented to the emergency room with complaints of worsening shortness of breath.  BNP 222.  Chest x-ray revealed moderate to large sized bilateral pleural effusions.  Comorbidities:  COPD Type 2 diabetes Previous tobacco abuse Lymphoma Anemia Chronic kidney disease stage III  Medications:  Furosemide 40 mg IV every 12 hours  Labs:  Magnesium 1.8, sodium 141, potassium 4.4, chloride 107, CO2 29, BUN 33, creatinine 0.68, albumin 3.1, estimated GFR greater than 60. Weight 63.5 kg Blood pressure 110/55 Thoracentesis output-1200 mL from right-sided thoracentesis   Initial meeting with patient in the ED.  She states that her breathing has improved since this thoracentesis.  She had last been admitted and underwent bilateral thoracentesis and then discharged on October 24th, 2023.  Went over how she takes care of herself at home.  She states that she has not been weighing herself as her scale is not on working at this time.  She feels that the" fluid she accumulates comes from her cancer."  Asked for her to list for me what she is drinking, she listed 3-20 ounce Cokes in addition to tap water that she is drinking.   Reinforced that her total intake of any fluid which includes coffee, tea, water, juices, sodas, things that melt at room temperature should not be taking in any more than 64 ounces.  Pointed out to her that with her 3-20 ounce Cokes she is already to almost the limit of her oral intake.  I then asked her to list things that she is eating.  She states that she makes Ramen noodles but does not add the packets and then adds cut up chicken and Alfredo sauce from the jar.  I asked if she measured the Alfredo sauce and knew how much sodium was contained in that and she said that she did not.  I asked what  else that she ate she states she also makes of spaghetti sauce with [place] tomato soup.  Using the whole can for 2 servings.  I pointed out to her that each serving of the soup contains 550 mg of sodium.   She is goes on to state that she would like to speak to her case worker as she would like to talk to her about getting a" section 8."  Did not see caseworker assigned to her at this time but did speak with her nurse Catalina Antigua in the ED.  Will continue to follow along.  Plan to re-inforce teaching as above.  Pricilla Riffle RN CHFN

## 2022-08-31 ENCOUNTER — Encounter: Payer: Self-pay | Admitting: Oncology

## 2022-08-31 ENCOUNTER — Telehealth: Payer: Self-pay | Admitting: Pharmacist

## 2022-08-31 ENCOUNTER — Other Ambulatory Visit: Payer: Self-pay | Admitting: *Deleted

## 2022-08-31 ENCOUNTER — Telehealth: Payer: Self-pay

## 2022-08-31 ENCOUNTER — Other Ambulatory Visit (HOSPITAL_COMMUNITY): Payer: Self-pay

## 2022-08-31 DIAGNOSIS — E1165 Type 2 diabetes mellitus with hyperglycemia: Secondary | ICD-10-CM | POA: Diagnosis not present

## 2022-08-31 DIAGNOSIS — C8580 Other specified types of non-Hodgkin lymphoma, unspecified site: Secondary | ICD-10-CM

## 2022-08-31 DIAGNOSIS — C8590 Non-Hodgkin lymphoma, unspecified, unspecified site: Secondary | ICD-10-CM | POA: Diagnosis not present

## 2022-08-31 DIAGNOSIS — J449 Chronic obstructive pulmonary disease, unspecified: Secondary | ICD-10-CM | POA: Diagnosis not present

## 2022-08-31 LAB — PROTEIN, BODY FLUID (OTHER): Total Protein, Body Fluid Other: 1.9 g/dL

## 2022-08-31 LAB — GLUCOSE, CAPILLARY
Glucose-Capillary: 72 mg/dL (ref 70–99)
Glucose-Capillary: 306 mg/dL — ABNORMAL HIGH (ref 70–99)
Glucose-Capillary: 360 mg/dL — ABNORMAL HIGH (ref 70–99)

## 2022-08-31 MED ORDER — SODIUM CHLORIDE 0.9 % IV BOLUS
500.0000 mL | Freq: Once | INTRAVENOUS | Status: AC
  Administered 2022-08-31: 500 mL via INTRAVENOUS

## 2022-08-31 MED ORDER — AZITHROMYCIN 250 MG PO TABS: ORAL_TABLET | ORAL | 0 refills | Status: DC

## 2022-08-31 MED ORDER — PREDNISONE 50 MG PO TABS: ORAL_TABLET | ORAL | 0 refills | Status: DC

## 2022-08-31 NOTE — Progress Notes (Signed)
   Heart Failure Nurse Navigator Note  With patient today about the importance of weighing herself daily and documenting and reporting 2 to 3 pound weight gain or 5 pound overnight.  She needed reinforcement she did not remember the guidelines.  Also states is her scale is not working because it needs a new battery.  I asked for her to to have her caregiver get her a new battery for her scale.  Also discussed fluid restriction of no more than 64 ounces.  She had no further questions and states that she is being discharged later this afternoon.  Pricilla Riffle RN CHFN

## 2022-08-31 NOTE — Progress Notes (Signed)
[place]  Telephone:([phone] [phone]  Patient Care Team: [name], NP as PCP - General (Nurse Practitioner) [name], RN (Inactive) as Case Manager [name], RN as [place] Management   Name of the patient: Natasha [name]  [id number]  07/03/1946   HPI: Patient is a 76 y.o. 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 Planned treatment with Calquence (acalabrutinib)  Reason for Consult: Acalabrutinb oral chemotherapy education.   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; [name]; 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  MEDICATIONS:  Current Facility-Administered Medications  Medication Dose Route Frequency Provider Last Rate Last Admin   acetaminophen (TYLENOL) tablet 650 mg  650 mg Oral Q6H PRN NIvor Costa MD       albuterol (PROVENTIL) (2.5 MG/3ML) 0.083% nebulizer solution 2.5 mg  2.5 mg Nebulization Q4H PRN NIvor Costa MD       azithromycin ([facility] tablet 250 mg  250 mg Oral Daily NIvor Costa MD   250 mg at 08/31/22 0859   clobetasol cream (TEMOVATE) 03.23% 1 Application  1 Application Topical Daily NIvor Costa MD   1 Application at 155/73/2200254  dextromethorphan-guaiFENesin (MBarnumDM) 30-600 MG per 12 hr tablet 1 tablet  1 tablet Oral BID PRN NIvor Costa MD       furosemide (LASIX) injection 40 mg  40 mg Intravenous Q12H NIvor Costa MD   40 mg at 08/30/22 2343   HYDROcodone-acetaminophen (NORCO/VICODIN) 5-325 MG per tablet 1 tablet  1 tablet Oral Q8H PRN NIvor Costa MD   1 tablet at 08/30/22 2344   insulin aspart (novoLOG) injection 0-15 Units  0-15 Units Subcutaneous TID WC ALorella Nimrod MD   11 Units at 08/31/22 1226   insulin aspart (novoLOG) injection 0-5 Units  0-5 Units Subcutaneous QHS ALorella Nimrod MD       insulin glargine-yfgn (SEMGLEE) injection 5 Units  5 Units Subcutaneous BID ALorella Nimrod MD   5 Units at 08/31/22 0911   ipratropium-albuterol (DUONEB) 0.5-2.5 (3) MG/3ML nebulizer solution 3 mL  3 mL Nebulization  Q6H Lorella Nimrod, MD   3 mL at 08/31/22 1336   levothyroxine (SYNTHROID) tablet 50 mcg  50 mcg Oral Q0600 Ivor Costa, MD   50 mcg at 08/31/22 8250   loperamide (IMODIUM) capsule 2 mg  2 mg Oral PRN Ivor Costa, MD       methocarbamol (ROBAXIN) tablet 500 mg  500 mg Oral Q8H PRN Ivor Costa, MD   500 mg at 08/30/22 1844   ondansetron (ZOFRAN) injection 4 mg  4 mg Intravenous Q8H PRN Ivor Costa, MD       predniSONE (DELTASONE) tablet 50 mg  50 mg Oral Q breakfast Lorella Nimrod, MD   50 mg at 08/31/22 0859   simethicone (MYLICON)  chewable tablet 200 mg  200 mg Oral Q6H PRN Ivor Costa, MD        VITAL SIGNS: BP (!) 97/55 (BP Location: Left Arm)   Pulse 89   Temp 98.2 F (36.8 C)   Resp 19   Ht '5\' 5"'$  (1.651 m)   Wt 71.4 kg   SpO2 96%   BMI 26.19 kg/m  Filed Weights   08/29/22 1541 08/31/22 0500  Weight: 63.5 kg 71.4 kg    Estimated body mass index is 26.19 kg/m as calculated from the following:   Height as of this encounter: '5\' 5"'$  (1.651 m).   Weight as of this encounter: 71.4 kg.  LABS: CBC:    Component Value Date/Time   WBC 4.1 08/29/2022 1325   HGB 7.3 (L) 08/29/2022 1325   HGB 8.0 (L) 06/22/2022 0939   HCT 24.9 (L) 08/29/2022 1325   HCT 26.6 (L) 06/22/2022 0939   PLT 83 (L) 08/29/2022 1325   PLT 77 (LL) 06/22/2022 0939   MCV 92.6 08/29/2022 1325   MCV 93 06/22/2022 0939   NEUTROABS 0.6 (L) 08/29/2022 1325   NEUTROABS 0.4 (LL) 06/22/2022 0939   LYMPHSABS 2.8 08/29/2022 1325   LYMPHSABS 3.1 06/22/2022 0939   MONOABS 0.6 08/29/2022 1325   EOSABS 0.0 08/29/2022 1325   EOSABS 0.1 06/22/2022 0939   BASOSABS 0.1 08/29/2022 1325   BASOSABS 0.0 06/22/2022 0939   Comprehensive Metabolic Panel:    Component Value Date/Time   NA 141 08/29/2022 1325   NA 143 06/22/2022 0939   K 4.4 08/29/2022 1325   CL 107 08/29/2022 1325   CO2 29 08/29/2022 1325   BUN 33 (H) 08/29/2022 1325   BUN 21 06/22/2022 0939   CREATININE 0.68 08/29/2022 1325   CREATININE 0.92 07/07/2018 0955   GLUCOSE 140 (H) 08/29/2022 1325   CALCIUM 9.5 08/29/2022 1325   AST 26 08/29/2022 1325   ALT 14 08/29/2022 1325   ALKPHOS 174 (H) 08/29/2022 1325   BILITOT 1.6 (H) 08/29/2022 1325   BILITOT 2.0 (H) 06/22/2022 0939   PROT 5.4 (L) 08/29/2022 1325   PROT 4.9 (L) 06/22/2022 0939   ALBUMIN 3.1 (L) 08/29/2022 1325   ALBUMIN 3.4 (L) 06/22/2022 0939     Present during today's visit: Patient seen inpatient, patient only in room at time of visit  Start plan: Patient will get started at home once medication is delivered by  09/02/22   Patient Education I spoke with patient for overview of new oral chemotherapy medication: acalabrutinib   Administration: Counseled patient on administration, dosing, side effects, monitoring, drug-food interactions, safe handling, storage, and disposal. Patient will take 1 tablet (100 mg total) by mouth 2 (two) times daily.   Side Effects: Side effects include but not limited to: headache, rash, diarrhea,  decreased wbc/hgb/plt, fatigue.    Drug-drug Interactions (DDI): No current DDIs with acalabrutinib  Adherence: Reviewed with patient importance of keeping a medication schedule and plan for any missed doses.  Natasha [name] voiced understanding and appreciation. All questions answered. Medication handout provided.  Provided patient with [facility] phone number. Patient knows to call the office with questions or concerns. Oral Chemotherapy Navigation Clinic will continue to follow.  Patient expressed understanding and was in agreement with this plan. She also understands that She can call clinic at any time with any questions, concerns, or complaints.   Medication Access Issues: No issue, patient to fill her medication at [facility] (Specialty)  Follow-up plan: RTC next week for f/u appt  Thank you for allowing me to participate in the care of this patient.   Time Total: 20 mins  Visit consisted of counseling and education on dealing with issues of symptom management in the setting of serious and potentially life-threatening illness.Greater than 50%  of this time was spent counseling and coordinating care related to the above assessment and plan.  Signed by: Darl Pikes, PharmD, BCPS, Salley Slaughter, CPP Hematology/Oncology Clinical Pharmacist Practitioner [place]/DB/AP [facility] [phone]  08/31/2022 3:18 PM

## 2022-08-31 NOTE — TOC Transition Note (Signed)
Transition of Care [facility]) - CM/SW Discharge Note   Patient Details  Name: Natasha [name] MRN: [id number] Date of Birth: 1946-09-29  [facility] CM/SW Contact:  Candie Chroman, LCSW Phone Number: 08/31/2022, 3:38 PM   Clinical Narrative: Patient has orders to discharge home today. CSW met with patient. No supports at bedside. CSW introduced role. SDOH flags for food insecurity and transportation barriers. Gave patient [employer] which includes phone number for DSS in case she wants to get set up with [employer]. It also includes contact information for the local food bank. Patient reports [name] also has food resources. Per chart review, caregiver has been trying to set her up with home health. Per chart review, patient's caregiver has been trying to set her up with home health. [name] is able to accept her for PT, OT, RN, aide. Asked MD to enter orders. Information added to AVS. Daughter will pick her up when ready. [employer] brought oxygen tank to the room for transport. Will have RN call daughter to coordinate pick up time. No further concerns. CSW signing off.   Final next level of care: [place] Barriers to Discharge: No Barriers Identified   Patient Goals and CMS Choice        Discharge Placement                Patient to be transferred to facility by: Daughter Name of family member notified: Lenon Oms Patient and family notified of of transfer: 08/31/22  Discharge Plan and Services                          HH Arranged: RN, PT, OT, Nurse's Aide [place] Agency: [place] Date [facility] Agency Contacted: 08/31/22   Representative spoke with at [place]: Adela Lank  Social Determinants of Health (SDOH) Interventions Food Insecurity Interventions: Inpatient TOC, Other (Comment) (Gave food bank information. Patient is also aware of assistance from [name].) Transportation Interventions:  Inpatient TOC, Other (Comment) (Patient reports having transportation. Gave DSS information in case she wants to be set up with Medicaid Transportation.)   Readmission Risk Interventions    08/17/2022   11:05 AM 08/04/2022    2:54 PM  Readmission Risk Prevention Plan  Transportation Screening Complete Complete  PCP or Specialist Appt within 5-7 Days  Complete  PCP or Specialist Appt within 3-5 Days Complete   Home Care Screening  Complete  Medication Review (RN CM)  Complete  HRI or Home Care Consult Complete   Social Work Consult for [place] Planning/Counseling Complete   Palliative Care Screening Not Applicable   Medication Review Press photographer) Complete

## 2022-08-31 NOTE — Progress Notes (Signed)
  Chaplain On-Call responded to a call from Nurse who reported the patient wanted more information about Advance Directives.  Chaplain met the patient, who stated that another Chaplain had told her that the Chaplain would fill out the forms for her.  Shift change for the Chaplains is happening, so this Chaplain referred the patient's request to the afternoon Chaplain, Josiah Lobo, for follow up.  Chaplain Pollyann Samples M.Div., [facility]

## 2022-08-31 NOTE — Telephone Encounter (Signed)
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 Due to performance status, patient is not considered a candidate for bendamustine + rituxumab at this time.   Counseled [name] on administration, dosing, side effects, monitoring, drug-food interactions, safe handling, storage, and disposal. Patient will take 1 tablet (100 mg total) by mouth 2 (two) times daily. .  Side effects include but not limited to: fatigue, headache, and diarrhea.    Reviewed with [name] importance of keeping a medication schedule and plan for any missed doses.  After discussion with [name] no patient barriers to medication adherence identified.   [name] voiced understanding and appreciation. All questions answered. Medication handout provided.  Provided [name] with Oral [facility] phone number. [name] knows to call the office with questions or concerns. Oral Chemotherapy Navigation Clinic will continue to follow.  Darl Pikes, PharmD, BCPS, BCOP, CPP Hematology/Oncology Clinical Pharmacist Practitioner [place]/DB/AP Oral [facility] [phone]  08/31/2022 4:32 PM

## 2022-08-31 NOTE — Plan of Care (Signed)
  Problem: Education: Goal: Ability to demonstrate management of disease process will improve Outcome: Progressing Goal: Ability to verbalize understanding of medication therapies will improve Outcome: Progressing Goal: Individualized Educational Video(s) Outcome: Progressing   Problem: Activity: Goal: Capacity to carry out activities will improve Outcome: Progressing   Problem: Cardiac: Goal: Ability to achieve and maintain adequate cardiopulmonary perfusion will improve Outcome: Progressing   Problem: Education: Goal: Knowledge of disease or condition will improve Outcome: Progressing Goal: Knowledge of the prescribed therapeutic regimen will improve Outcome: Progressing Goal: Individualized Educational Video(s) Outcome: Progressing   Problem: Activity: Goal: Ability to tolerate increased activity will improve Outcome: Progressing Goal: Will verbalize the importance of balancing activity with adequate rest periods Outcome: Progressing   Problem: Respiratory: Goal: Ability to maintain a clear airway will improve Outcome: Progressing Goal: Levels of oxygenation will improve Outcome: Progressing Goal: Ability to maintain adequate ventilation will improve Outcome: Progressing   Problem: Education: Goal: Ability to describe self-care measures that may prevent or decrease complications (Diabetes Survival Skills Education) will improve Outcome: Progressing Goal: Individualized Educational Video(s) Outcome: Progressing   Problem: Coping: Goal: Ability to adjust to condition or change in health will improve Outcome: Progressing   Problem: Fluid Volume: Goal: Ability to maintain a balanced intake and output will improve Outcome: Progressing   Problem: Health Behavior/Discharge Planning: Goal: Ability to identify and utilize available resources and services will improve Outcome: Progressing Goal: Ability to manage health-related needs will improve Outcome: Progressing    Problem: Metabolic: Goal: Ability to maintain appropriate glucose levels will improve Outcome: Progressing   Problem: Nutritional: Goal: Maintenance of adequate nutrition will improve Outcome: Progressing Goal: Progress toward achieving an optimal weight will improve Outcome: Progressing   Problem: Skin Integrity: Goal: Risk for impaired skin integrity will decrease Outcome: Progressing   Problem: Tissue Perfusion: Goal: Adequacy of tissue perfusion will improve Outcome: Progressing   Problem: Education: Goal: Knowledge of General Education information will improve Description: Including pain rating scale, medication(s)/side effects and non-pharmacologic comfort measures Outcome: Progressing   Problem: Health Behavior/Discharge Planning: Goal: Ability to manage health-related needs will improve Outcome: Progressing   Problem: Clinical Measurements: Goal: Ability to maintain clinical measurements within normal limits will improve Outcome: Progressing Goal: Will remain free from infection Outcome: Progressing Goal: Diagnostic test results will improve Outcome: Progressing Goal: Respiratory complications will improve Outcome: Progressing Goal: Cardiovascular complication will be avoided Outcome: Progressing   Problem: Activity: Goal: Risk for activity intolerance will decrease Outcome: Progressing   Problem: Nutrition: Goal: Adequate nutrition will be maintained Outcome: Progressing   Problem: Coping: Goal: Level of anxiety will decrease Outcome: Progressing   Problem: Elimination: Goal: Will not experience complications related to bowel motility Outcome: Progressing Goal: Will not experience complications related to urinary retention Outcome: Progressing   Problem: Pain Managment: Goal: General experience of comfort will improve Outcome: Progressing   Problem: Safety: Goal: Ability to remain free from injury will improve Outcome: Progressing   Problem:  Skin Integrity: Goal: Risk for impaired skin integrity will decrease Outcome: Progressing

## 2022-08-31 NOTE — Patient Outreach (Signed)
  Care Coordination   Patient care consultation  Visit Note   08/31/2022 Name: Demetra Moya [place] MRN: [id number] DOB: Jun 04, 1946  Prentice Docker Scaletta is a 76 y.o. year old female who sees [name], NP for primary care. Patient care consultation  What matters to the patients health and wellness today?  Patient discharged from [facility] today 08/31/22.     Goals Addressed             This Visit's Progress    Patient / caregiver stated:  Management of health conditions       Care Coordination Interventions: Collaboration with inpatient LCSW, Michail Sermon Message sent to patients primary care provider updating her that patient admitted on 08/29/22 and discharged today 08/31/22 with [place] home health services providing (RN, PT, OT, HHA).              SDOH assessments and interventions completed:  No     Care Coordination Interventions Activated:  Yes  Care Coordination Interventions:  Yes, provided   Follow up plan: Follow up call scheduled for as previously scheduled    Encounter Outcome:  Pt. Visit Completed   Quinn Plowman RN,BSN,CCM [place] [phone] direct line

## 2022-08-31 NOTE — Progress Notes (Signed)
   08/31/22 1400  Clinical Encounter Type  Visited With Patient  Visit Type Initial  Referral From Chaplain  Consult/Referral To Chaplain   Advance Directive completed, uploaded and copied for physical file. Two copies given to patient.

## 2022-08-31 NOTE — Discharge Summary (Addendum)
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  Natasha [name] is a 76 y.o. 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   Patient also has a history of recurrent pleural effusion secondary to lymphoma, recently underwent right-sided thoracentesis. She also endorsed some worsening of bilateral lower extremity edema.  Patient was found to have severe respiratory distress, cannot speak in full sentence. Patient initially had oxygen desaturation to 80s on home level 4 L oxygen, started on nonrebreather, then her oxygen saturation has improved to 96% on 4 L oxygen in ED.   Data reviewed independently and ED Course: pt was found to have BNP 222, troponin level 7, WBC 6.1, electrolytes renal function okay, negative COVID PCR, temperature normal, blood pressure 129/68, heart rate 101, 96, RR 22.  Chest x-ray showed worsening bilateral pleural effusion (moderate to large size) and pulmonary edema.  EKG: I have personally reviewed.  Sinus rhythm, QTc 453, low voltage, PVC.  Oncology was consulted and  thoracentesis was ordered.  Per Oncology patient patient has very significant disease with extensive lymphadenopathy and should be started on treatment ASAP.  She has a port in place but has not started treatment due to poor health as well as transportation issues.  Patient was also very reluctant to start chemotherapy as she does not want to loose hairs.  Oncology is making some changes and might go with single oral agent as outpatient instead of having approved anti-B cell therapy.  11/6: Patient has pancytopenia secondary to lymphoma, oncology is recommending blood transfusions if hemoglobin drops below 7. CBG elevated, did receive steroids yesterday.  Switching her to SSI and semglee 5 units twice daily and discontinuing 70/30 at this time. Patient had thoracentesis with removal of 1.2 L of amber-colored fluid flow-Labs sent. 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   Patient is very high risk for deterioration and mortality life limiting illness and are reluctant to do appropriate treatment.  She is currently full code.  Message sent to Dr. Janese Banks to involve cancer center palliative care.  11/7: Palliative care has a discussion with patient and now she agrees to proceed with IV cancer treatment with less hair loss.  Saturating well on 3 L of oxygen. Chaplain was also consulted to complete POA Paperwork, she wants her daughter to be her POA.  Oncology is planning to start her on p.o. therapy with less hair loss. Patient is stable and her baseline from respiratory standpoint. She is being given 3 more days of Zithromax and prednisone.  She will continue on current medications and need to have a close follow-up with  her providers for further recommendations.  Patient will remain high risk for readmission, deterioration and mortality.  Palliative care from cancer center is also following.  Assessment and Plan: * Acute on chronic respiratory  failure with hypoxia (HCC) Most likely multifactorial with recurrent pleural effusion and concern of COPD exacerbation.  Also concern of  CHF exacerbation.  Elevated BNP, crackles on auscultation and lower extremity edema. Initially requiring nonbreathing.  Now back to her baseline oxygen 4 L via S/p thoracentesis. -Continue with bronchodilators. -IV diuresis for CHF exacerbation -Continue supplemental oxygen  Acute on chronic diastolic CHF (congestive heart failure) ([place])  2D echo on 07/13/2022 showed EF 60-65%.  BNP mildly elevated at 222 -Lasix 40 mg bid by IV -Daily weights -strict I/O's -Low salt diet -Fluid restriction  CHF exacerbation (HCC) Patient initially meets criteria for sepsis with heart rate 101, RR 22. Some wheezing. Procalcitonin and lactic acid negative.  Most likely sepsis response with acute on chronic respiratory failure.  Sepsis ruled out -Received Solu-Medrol and now on prednisone  -Continue with Z-Pak -Mucinex for cough  -Incentive spirometry -sputum culture -Nasal cannula oxygen as needed to maintain O2 saturation 93% or greate  Recurrent pleural effusion Most likely secondary to underlying malignancy. S/p thoracentesis. -Follow-up thoracentesis labs  Marginal zone lymphoma [facility]) Patient with significant disease burden but has not started the chemo yet. Missed multiple outpatient oncology appointments either being in the hospital for worsening respiratory status or transportation issues. Patient had a port in place but does not want chemotherapy stating that she does not want to loose her hairs. -Oncology was consulted -Palliative care consult-oncology with consult cancer center palliative  Hypothyroidism -Continue Synthroid  Diabetes mellitus without complication (HCC) Elevated CBG , most likely due to steroid as recent A1c was 5.5. Patient was on 70/30 at home. -Starting her on Semglee -SSI  Normocytic anemia Baseline hemoglobin 7-8.  Her  hemoglobin is 7.3, at baseline -Follow-up with CBC -Transfuse if below 7    Thrombocytopenia (HCC) Seems chronic, most likely secondary to lymphoma. Patient has pancytopenia. -Continue to monitor   Consultants: Oncology Procedures performed: None Disposition: Home Diet recommendation:  Discharge Diet Orders (From admission, onward)     Start     Ordered   08/31/22 0000  Diet - low sodium heart healthy        08/31/22 1326           Cardiac and Carb modified diet DISCHARGE MEDICATION: Allergies as of 08/31/2022   No Known Allergies      Medication List     TAKE these medications    acyclovir 400 MG tablet Commonly known as: ZOVIRAX Take 400 mg by mouth daily.   albuterol 108 (90 Base) MCG/ACT inhaler Commonly known as: VENTOLIN HFA TAKE 2 PUFFS BY MOUTH EVERY 6 HOURS AS NEEDED FOR WHEEZE OR SHORTNESS OF BREATH   azithromycin 250 MG tablet Commonly known as: ZITHROMAX 1 tablet daily for the next 3 days Start taking on: September 01, 2022   Calquence 100 MG tablet Generic drug: acalabrutinib maleate Take 1 tablet (100 mg total) by mouth 2 (two) times daily.   clobetasol cream 0.05 % Commonly known as: TEMOVATE Apply 1 Application topically daily.   Combivent Respimat 20-100 MCG/ACT Aers respimat Generic drug: Ipratropium-Albuterol Inhale 1 puff into the lungs 3 (three) times daily.   HYDROcodone-acetaminophen 5-325 MG tablet Commonly known as: Norco Take 1 tablet by mouth every 8 (eight) hours as needed for moderate pain.   insulin NPH-regular Human (  70-30) 100 UNIT/ML injection Inject 5 Units into the skin 2 (two) times daily with a meal. Use only for hyperglycemia, CBG+ 300. Start with 5 units, titrate up to max 10 unit per dose if need. What changed: how much to take   insulin regular 100 units/mL injection Commonly known as: NOVOLIN R Inject 5-10 Units into the skin 3 (three) times daily before meals. BG 200-299 give 5 units BG 300-399 give 10  units.   levothyroxine 50 MCG tablet Commonly known as: SYNTHROID Take 1 tablet (50 mcg total) by mouth daily at 6 (six) AM.   lidocaine-prilocaine cream Commonly known as: EMLA Apply to affected area once What changed:  how much to take additional instructions   loperamide 2 MG capsule Commonly known as: IMODIUM Take 2 mg by mouth as needed for diarrhea or loose stools.   ondansetron 8 MG tablet Commonly known as: ZOFRAN Take 8 mg by mouth every 8 (eight) hours as needed for nausea, vomiting or refractory nausea / vomiting. Take 1 tablet q6H for 4 days after chemotherapy.   predniSONE 50 MG tablet Commonly known as: DELTASONE 1 tablet daily for the next 3 days Start taking on: September 01, 2022 What changed:  medication strength how much to take how to take this when to take this additional instructions   prochlorperazine 10 MG tablet Commonly known as: COMPAZINE Take 1 tablet by mouth every 6 (six) hours as needed for nausea or vomiting. Take 1 tab q6H for 4 days after chemotherapy.   simethicone 80 MG chewable tablet Commonly known as: MYLICON Chew 762 mg by mouth every 6 (six) hours as needed for flatulence. Take 1 capsule PO PRN gas relief.        Follow-up Information     [name], NP. Schedule an appointment as soon as possible for a visit in 1 week(s).   Specialties: Nurse Practitioner, Family Medicine Contact information: [place] [address] [phone]         Sindy Guadeloupe, MD. Schedule an appointment as soon as possible for a visit in 1 week(s).   Specialty: Oncology Contact information: [address] [place] [address] [phone]                Discharge Exam: Danley Danker Weights   08/29/22 1541 08/31/22 0500  Weight: 63.5 kg 71.4 kg   General.     In no acute distress. Pulmonary.  Lungs clear bilaterally, normal respiratory effort. CV.  Regular rate and rhythm, no JVD, rub or murmur. Abdomen.   Soft, nontender, nondistended, BS positive. CNS.  Alert and oriented .  No focal neurologic deficit. Extremities.  No edema, no cyanosis, pulses intact and symmetrical. Psychiatry.  Judgment and insight appears normal.   Condition at discharge: stable  The results of significant diagnostics from this hospitalization (including imaging, microbiology, ancillary and laboratory) are listed below for reference.   Imaging Studies: DG Chest Port 1 View  Result Date: 08/30/2022 CLINICAL DATA:  Post thoracentesis.  RIGHT. EXAM: PORTABLE CHEST 1 VIEW COMPARISON:  IR ultrasound, earlier same day. Chest XR, 08/29/2022. FINDINGS: Support lines: Stable positioning of RIGHT chest port with catheter tip at the superior cavoatrial junction. Overlying pacer leads. Cardiac apex is obscured. Mediastinal silhouette is within normal limits. Lungs are hypoinflated with perihilar opacities and bibasilar consolidations. Interval removal of RIGHT pleural effusion, with small volume residual. Small-to-moderate volume LEFT pleural effusion. No pneumothorax. No interval osseous abnormality. IMPRESSION: 1. No pneumothorax status post RIGHT  thoracentesis. 2. Bilateral pleural effusions and basilar consolidations, likely atelectasis. Electronically Signed   By: Michaelle Birks M.D.   On: 08/30/2022 12:29   US THORACENTESIS ASP PLEURAL SPACE W/IMG GUIDE  Result Date: 08/30/2022 INDICATION: History of gastric lymphoma with recurrent pleural effusions. Patient presented to ED with complaint of shortness of breath found to have bilateral pleural effusions. Upon ultrasound exam right greater than left. EXAM: ULTRASOUND GUIDED DIAGNOSTIC AND THERAPEUTIC RIGHT THORACENTESIS MEDICATIONS: 10 mL 1 % lidocaine COMPLICATIONS: None immediate. PROCEDURE: An ultrasound guided thoracentesis was thoroughly discussed with the patient and questions answered. The benefits, risks, alternatives and complications were also discussed. The patient understands  and wishes to proceed with the procedure. Written consent was obtained. Ultrasound was performed to localize and mark an adequate pocket of fluid in the RIGHT chest. The area was then prepped and draped in the normal sterile fashion. 1% Lidocaine was used for local anesthesia. Under ultrasound guidance a 6 Fr Safe-T-Centesis catheter was introduced. Thoracentesis was performed. The catheter was removed and a dressing applied. FINDINGS: A total of approximately 1.2 L of hazy, amber colored fluid was removed. Samples were sent to the laboratory as requested by the clinical team. IMPRESSION: Successful ultrasound guided right thoracentesis yielding 1.2 L of pleural fluid. Read by: Narda Rutherford, AGNP-BC Electronically Signed   By: Michaelle Birks M.D.   On: 08/30/2022 12:25   DG Chest Portable 1 View  Result Date: 08/29/2022 CLINICAL DATA:  Shortness of breath. EXAM: PORTABLE CHEST 1 VIEW COMPARISON:  08/17/2022 FINDINGS: Moderate to large-sized bilateral pleural effusions, significantly increased. Progressive increase in prominence of the pulmonary vasculature and interstitial markings. Less airspace opacity and volume loss at the lung bases. Stable right jugular porta catheter with its tip in the region of the superior cavoatrial junction or upper right atrium. Unremarkable bones. IMPRESSION: 1. Moderate to large-sized bilateral pleural effusions, significantly increased. 2. Progressive changes of congestive heart failure. 3. Mildly improved bibasilar atelectasis. Electronically Signed   By: Claudie Revering M.D.   On: 08/29/2022 14:17   US THORACENTESIS ASP PLEURAL SPACE W/IMG GUIDE  Result Date: 08/17/2022 INDICATION: Patient with history of gastric lymphoma with recurrent pleural effusions. Request is for therapeutic thoracentesis. EXAM: ULTRASOUND GUIDED THERAPEUTIC RIGHT-SIDED THORACENTESIS MEDICATIONS: Lidocaine 1% 10 mL COMPLICATIONS: None immediate. PROCEDURE: An ultrasound guided thoracentesis was thoroughly  discussed with the patient and questions answered. The benefits, risks, alternatives and complications were also discussed. The patient understands and wishes to proceed with the procedure. Written consent was obtained. Ultrasound was performed to localize and mark an adequate pocket of fluid in the RIGHT chest. The area was then prepped and draped in the normal sterile fashion. 1% Lidocaine was used for local anesthesia. Under ultrasound guidance a 6 Fr Safe-T-Centesis catheter was introduced. Thoracentesis was performed. The catheter was removed and a dressing applied. FINDINGS: A total of approximately 850 mL of straw-colored fluid was removed. IMPRESSION: Successful ultrasound guided therapeutic RIGHT-sided thoracentesis yielding 850 mL of pleural fluid. Read by: Rushie Nyhan, NP Electronically Signed   By: Michaelle Birks M.D.   On: 08/17/2022 13:23   DG Chest Port 1 View  Result Date: 08/17/2022 CLINICAL DATA:  Post right-sided thoracentesis EXAM: PORTABLE CHEST 1 VIEW COMPARISON:  08/16/2022; 08/03/2022; chest CT-07/12/2022 FINDINGS: Grossly unchanged enlarged cardiac silhouette and mediastinal contours. Stable positioning of support apparatus. Interval reduction in persistent small right-sided effusion post thoracentesis. No pneumothorax. Improved aeration the right lung base with persistent right basilar heterogeneous/consolidative opacities. Unchanged small left-sided effusion  associated left basilar opacities. Pulmonary vasculature remains indistinct with cephalization of flow. No acute osseous abnormalities. IMPRESSION: 1. Interval reduction in persistent small right-sided effusion post thoracentesis. No pneumothorax. 2. Improved aeration the right lung base with otherwise similar findings of pulmonary edema, small left-sided effusion and associated bibasilar opacities, likely atelectasis. Electronically Signed   By: Sandi Mariscal M.D.   On: 08/17/2022 13:12   DG Chest Port 1 View  Result Date:  08/16/2022 CLINICAL DATA:  Shortness of breath. EXAM: PORTABLE CHEST 1 VIEW COMPARISON:  08/03/2022 FINDINGS: Right jugular Port-A-Cath with the tip in the lower SVC region. Markedly increased densities in the mid and lower right chest that could represent a large pleural fluid and/or airspace disease. Again noted are prominent lung markings bilaterally which are similar to the previous examination. Negative for pneumothorax. Heart size is grossly stable. Slightly increased densities at the left lung base could represent atelectasis or pleural fluid. IMPRESSION: 1. Markedly increased densities in the mid and lower right chest. Findings could represent a large pleural effusion and/or airspace disease. 2. Prominent lung markings bilaterally are similar to the previous examination and could represent vascular congestion or pulmonary edema. Electronically Signed   By: Markus Daft M.D.   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 A total of Versed 1 mg and Fentanyl 50 mcg was administered intravenously. Moderate Sedation Time: 25 minutes. The patient's level of consciousness and vital signs were monitored continuously by radiology nursing throughout the procedure under my direct supervision. FLUOROSCOPY TIME:  Fluoroscopy Time: 0.5 minutes (5 mGy) COMPLICATIONS: None immediate. PROCEDURE: Informed written consent was obtained from the patient after a thorough discussion of the procedural risks, benefits and alternatives. All questions were addressed. Maximal Sterile Barrier Technique was utilized including caps, mask, sterile gowns, sterile gloves, sterile drape, hand hygiene and skin antiseptic. A timeout was  performed prior to the initiation of the procedure. The patient was placed supine on the exam table. The right neck and chest was prepped and draped in the standard sterile fashion. A preliminary ultrasound of the right neck was performed and demonstrates a patent right internal jugular vein. A permanent ultrasound image was stored in the electronic medical record. The overlying skin was anesthetized with 1% Lidocaine. Using ultrasound guidance, access was obtained into the right internal jugular vein using a 21 gauge micropuncture set. A wire was advanced into the SVC, a short incision was made at the puncture site, and serial dilatation performed. Next, in an ipsilateral infraclavicular location, an incision was made at the site of the subcutaneous reservoir. Blunt dissection was used to open a pocket to contain the reservoir. A subcutaneous tunnel was then created from the port site to the puncture site. A(n) 8 Fr single lumen catheter was advanced through the tunnel. The catheter was attached to the port and this was placed in the subcutaneous pocket. Under fluoroscopic guidance, a peel away sheath was placed, and the catheter was trimmed to the appropriate length and was advanced into the central veins. The catheter length is 24 cm. The tip of the catheter lies near the superior cavoatrial junction. The port flushes and aspirates appropriately. The port was flushed and locked with heparinized saline. The port pocket was closed in 2 layers using  3-0 and 4-0 Vicryl/absorbable suture. Dermabond was also applied to both incisions. The patient was transferred to recovery in stable condition. IMPRESSION: Successful placement of a right chest port via the right internal jugular vein. The port is ready for immediate use. Electronically Signed   By: Albin Felling M.D.   On: 08/16/2022 15:11   US THORACENTESIS ASP PLEURAL SPACE W/IMG GUIDE  Result Date: 08/03/2022 INDICATION: Patient with a history of gastric  lymphoma with recurrent pleural effusions. Interventional radiology asked to perform a diagnostic and therapeutic thoracentesis. EXAM: ULTRASOUND GUIDED RIGHT THORACENTESIS MEDICATIONS: 1% lidocaine 10 mL COMPLICATIONS: None immediate. PROCEDURE: An ultrasound guided thoracentesis was thoroughly discussed with the patient and questions answered. The benefits, risks, alternatives and complications were also discussed. The patient understands and wishes to proceed with the procedure. Written consent was obtained. Ultrasound was performed to localize and mark an adequate pocket of fluid in the right chest. The area was then prepped and draped in the normal sterile fashion. 1% Lidocaine was used for local anesthesia. Under ultrasound guidance a 6 Fr Safe-T-Centesis catheter was introduced. Thoracentesis was performed. The catheter was removed and a dressing applied. FINDINGS: A total of approximately 1 L of clear yellow fluid was removed. Samples were sent to the laboratory as requested by the clinical team. IMPRESSION: Successful ultrasound guided right thoracentesis yielding 1 L of pleural fluid. Read by: Soyla Dryer, NP Electronically Signed   By: Ruthann Cancer M.D.   On: 08/03/2022 13:01   DG Chest Port 1 View  Result Date: 08/03/2022 CLINICAL DATA:  Status post thoracentesis. EXAM: PORTABLE CHEST 1 VIEW COMPARISON:  08/02/2022 FINDINGS: 1151 hours. No evidence for pneumothorax. Cardiopericardial silhouette is at upper limits of normal for size. Vascular congestion with diffuse interstitial opacity. There is bibasilar atelectasis with small bilateral pleural effusions, decreased in the interval. Telemetry leads overlie the chest. IMPRESSION: 1. No evidence for pneumothorax status post thoracentesis. 2. Interval decrease in bilateral pleural effusions. Electronically Signed   By: Misty Stanley M.D.   On: 08/03/2022 12:07   DG Chest 2 View  Result Date: 08/02/2022 CLINICAL DATA:  Shortness of breath EXAM:  CHEST - 2 VIEW COMPARISON:  Chest x-ray dated July 13, 2022 FINDINGS: Visualized cardiac and mediastinal contours are unchanged. Right lung granuloma. Moderate bilateral pleural effusions and bibasilar atelectasis. No evidence of pneumothorax. IMPRESSION: Moderate bilateral pleural effusions and bibasilar atelectasis, similar to prior radiograph. Electronically Signed   By: Yetta Glassman M.D.   On: 08/02/2022 16:46    Microbiology: Results for orders placed or performed during the hospital encounter of 08/29/22  SARS Coronavirus 2 by RT PCR (hospital order, performed in [facility] hospital lab) *cepheid single result test* Anterior Nasal Swab     Status: None   Collection Time: 08/29/22  1:26 PM   Specimen: Anterior Nasal Swab  Result Value Ref Range Status   SARS Coronavirus 2 by RT PCR NEGATIVE NEGATIVE Final    Comment: (NOTE) SARS-CoV-2 target nucleic acids are NOT DETECTED.  The SARS-CoV-2 RNA is generally detectable in upper and lower respiratory specimens during the acute phase of infection. The lowest concentration of SARS-CoV-2 viral copies this assay can detect is 250 copies / mL. A negative result does not preclude SARS-CoV-2 infection and should not be used as the sole basis for treatment or other patient management decisions.  A negative result may occur with improper specimen collection / handling, submission of specimen other than nasopharyngeal swab, presence of viral mutation(s) within the areas  targeted by this assay, and inadequate number of viral copies (<250 copies / mL). A negative result must be combined with clinical observations, patient history, and epidemiological information.  Fact Sheet for Patients:   https://www.patel.info/  Fact Sheet for Healthcare Providers: https://hall.com/  This test is not yet approved or  cleared by the [place] FDA and has been authorized for detection and/or diagnosis of  SARS-CoV-2 by FDA under an Emergency Use Authorization (EUA).  This EUA will remain in effect (meaning this test can be used) for the duration of the COVID-19 declaration under Section 564(b)(1) of the Act, 21 U.S.C. section 360bbb-3(b)(1), unless the authorization is terminated or revoked sooner.  Performed at [facility], [address] [place], [place] [address]     Labs: CBC: Recent Labs  Lab 08/29/22 1325  WBC 4.1  NEUTROABS 0.6*  HGB 7.3*  HCT 24.9*  MCV 92.6  PLT 83*   Basic Metabolic Panel: Recent Labs  Lab 08/29/22 1325 08/30/22 0548  NA 141  --   K 4.4  --   CL 107  --   CO2 29  --   GLUCOSE 140*  --   BUN 33*  --   CREATININE 0.68  --   CALCIUM 9.5  --   MG  --  1.8   Liver Function Tests: Recent Labs  Lab 08/29/22 1325  AST 26  ALT 14  ALKPHOS 174*  BILITOT 1.6*  PROT 5.4*  ALBUMIN 3.1*   CBG: Recent Labs  Lab 08/30/22 1228 08/30/22 1610 08/30/22 2102 08/31/22 0730 08/31/22 1217  GLUCAP 277* 133* 126* 72 306*    Discharge time spent: greater than 30 minutes.  This record has been created using Systems analyst. Errors have been sought and corrected,but may not always be located. Such creation errors do not reflect on the standard of care.   Signed: Lorella Nimrod, MD [facility] 08/31/2022

## 2022-09-01 ENCOUNTER — Telehealth: Payer: Self-pay

## 2022-09-01 DIAGNOSIS — C8590 Non-Hodgkin lymphoma, unspecified, unspecified site: Secondary | ICD-10-CM | POA: Diagnosis not present

## 2022-09-01 DIAGNOSIS — J449 Chronic obstructive pulmonary disease, unspecified: Secondary | ICD-10-CM | POA: Diagnosis not present

## 2022-09-01 DIAGNOSIS — E1165 Type 2 diabetes mellitus with hyperglycemia: Secondary | ICD-10-CM | POA: Diagnosis not present

## 2022-09-01 LAB — CYTOLOGY - NON PAP

## 2022-09-01 NOTE — Telephone Encounter (Signed)
Transition Care Management Follow-up Telephone Call Date of discharge and from where: [place] 08/31/2022 How have you been since you were released from the hospital? weak Any questions or concerns? No  Items Reviewed: Did the pt receive and understand the discharge instructions provided? Yes  Medications obtained and verified? Yes  Other? No  Any new allergies since your discharge? No  Dietary orders reviewed? Yes Do you have support at home? Yes   Home Care and Equipment/Supplies: Were home health services ordered? yes If so, what is the name of the agency? [facility]  Has the agency set up a time to come to the patient's home? no Were any new equipment or medical supplies ordered?  No What is the name of the medical supply agency? N/a Were you able to get the supplies/equipment? no Do you have any questions related to the use of the equipment or supplies? No  Functional Questionnaire: (I = Independent and D = Dependent) ADLs: I  Bathing/Dressing- I  Meal Prep- D  Eating- I  Maintaining continence- D  Transferring/Ambulation- I  Managing Meds- D  Follow up appointments reviewed:  PCP Hospital f/u appt confirmed? Yes  Scheduled to see [name] on 09/07/2022 @ 2:40. [facility] f/u appt confirmed? Yes  Scheduled to see Dr Janese Banks on 11/14/202 @ 9:00. Are transportation arrangements needed? No  If their condition worsens, is the pt aware to call PCP or go to the Emergency Dept.? Yes Was the patient provided with contact information for the PCP's office or ED? Yes Was to pt encouraged to call back with questions or concerns? Yes Juanda Crumble, LPN [place] Direct Dial [phone]

## 2022-09-02 ENCOUNTER — Ambulatory Visit: Payer: Self-pay

## 2022-09-02 ENCOUNTER — Other Ambulatory Visit: Payer: Medicare Other

## 2022-09-02 ENCOUNTER — Telehealth: Payer: Self-pay

## 2022-09-02 ENCOUNTER — Other Ambulatory Visit: Payer: Self-pay

## 2022-09-02 VITALS — BP 118/64 | HR 81 | Temp 97.3°F

## 2022-09-02 DIAGNOSIS — C8300 Small cell B-cell lymphoma, unspecified site: Secondary | ICD-10-CM | POA: Diagnosis not present

## 2022-09-02 DIAGNOSIS — C8590 Non-Hodgkin lymphoma, unspecified, unspecified site: Secondary | ICD-10-CM | POA: Diagnosis not present

## 2022-09-02 DIAGNOSIS — C9590 Leukemia, unspecified not having achieved remission: Secondary | ICD-10-CM | POA: Diagnosis not present

## 2022-09-02 DIAGNOSIS — J9811 Atelectasis: Secondary | ICD-10-CM | POA: Diagnosis not present

## 2022-09-02 DIAGNOSIS — J441 Chronic obstructive pulmonary disease with (acute) exacerbation: Secondary | ICD-10-CM | POA: Diagnosis not present

## 2022-09-02 DIAGNOSIS — I493 Ventricular premature depolarization: Secondary | ICD-10-CM | POA: Diagnosis not present

## 2022-09-02 DIAGNOSIS — Z9181 History of falling: Secondary | ICD-10-CM | POA: Diagnosis not present

## 2022-09-02 DIAGNOSIS — Z87891 Personal history of nicotine dependence: Secondary | ICD-10-CM | POA: Diagnosis not present

## 2022-09-02 DIAGNOSIS — A419 Sepsis, unspecified organism: Secondary | ICD-10-CM | POA: Diagnosis not present

## 2022-09-02 DIAGNOSIS — Z7952 Long term (current) use of systemic steroids: Secondary | ICD-10-CM | POA: Diagnosis not present

## 2022-09-02 DIAGNOSIS — D696 Thrombocytopenia, unspecified: Secondary | ICD-10-CM | POA: Diagnosis not present

## 2022-09-02 DIAGNOSIS — Z515 Encounter for palliative care: Secondary | ICD-10-CM

## 2022-09-02 DIAGNOSIS — E039 Hypothyroidism, unspecified: Secondary | ICD-10-CM | POA: Diagnosis not present

## 2022-09-02 DIAGNOSIS — J9621 Acute and chronic respiratory failure with hypoxia: Secondary | ICD-10-CM | POA: Diagnosis not present

## 2022-09-02 DIAGNOSIS — E1165 Type 2 diabetes mellitus with hyperglycemia: Secondary | ICD-10-CM | POA: Diagnosis not present

## 2022-09-02 DIAGNOSIS — E119 Type 2 diabetes mellitus without complications: Secondary | ICD-10-CM | POA: Diagnosis not present

## 2022-09-02 DIAGNOSIS — J449 Chronic obstructive pulmonary disease, unspecified: Secondary | ICD-10-CM | POA: Diagnosis not present

## 2022-09-02 DIAGNOSIS — Z794 Long term (current) use of insulin: Secondary | ICD-10-CM | POA: Diagnosis not present

## 2022-09-02 DIAGNOSIS — Z9981 Dependence on supplemental oxygen: Secondary | ICD-10-CM | POA: Diagnosis not present

## 2022-09-02 DIAGNOSIS — I5033 Acute on chronic diastolic (congestive) heart failure: Secondary | ICD-10-CM | POA: Diagnosis not present

## 2022-09-02 DIAGNOSIS — D630 Anemia in neoplastic disease: Secondary | ICD-10-CM | POA: Diagnosis not present

## 2022-09-02 LAB — BODY FLUID CULTURE W GRAM STAIN
Culture: NO GROWTH
Gram Stain: NONE SEEN

## 2022-09-02 LAB — CHOLESTEROL, BODY FLUID: Cholesterol, Fluid: 27 mg/dL

## 2022-09-02 NOTE — Patient Outreach (Signed)
  Care Coordination   Follow Up Visit Note   09/02/2022 Name: Natasha [name] MRN: [id number] DOB: 1946/06/25  Natasha [name] is a 76 y.o. year old female who sees Natasha Lo, NP for primary care. I  spoke with caregiver, Natasha [name].   What matters to the patients health and wellness today?  Caregiver states patient has been stable since home from the hospital. He reports Palliative care met with patient today and will follow up with her 2x per month.  He states [name] home health services started with patient today.  Caregiver states he has tried to contact patients DSNP representative, Natasha [name] several times for patient without success or return call.  Caregiver states patient has all of her medications and takes them as prescribed. He reports patient has a post hospital follow up visit with her primary care provider on 09/10/22.  Caregiver states he requested an order for a bedside commode for patient from her primary care providers office today. He states he has not heard back.  Telephone call to primary care providers office. Spoke with Natasha [name] who states primary provider will submit order for bedside commode today.    Goals Addressed             This Visit's Progress    Patient / caregiver stated:  Management of health conditions       Care Coordination Interventions: Collaborating with social worker regarding caregiver respite care resources RNCM to contact [place] representative, Natasha [name] to collaborate regarding patients health care needs and/ or needed resources.  Medications discussed.   Caregiver advised to contact patients primary care provider for questions / concerns / or adjustments with patients medications Provider office contacted for follow up on bedside commode request.               SDOH assessments and interventions completed:  No     Care Coordination Interventions Activated:  Yes  Care Coordination Interventions:  Yes, provided    Follow up plan: Follow up call scheduled for 09/22/22     Encounter Outcome:  Pt. Visit Completed   Quinn Plowman RN,BSN,CCM [place] [phone] direct line

## 2022-09-02 NOTE — Patient Outreach (Signed)
  Care Coordination   09/02/2022 Name: Natasha [name] MRN: [id number] DOB: 06/05/1946   Care Coordination Outreach Attempts:  Successful contact made with caregiver, [name].  He states he is currently getting patient enrolled with another insurance provider and request call back at another time.   Follow Up Plan:  Additional outreach attempts will be made to offer the patient care coordination information and services.   Encounter Outcome:  Caregiver/ Pt. Request to Call Back  Care Coordination Interventions Activated:  No   Care Coordination Interventions:  No, not indicated    Quinn Plowman [facility] [place] [phone] direct line

## 2022-09-03 ENCOUNTER — Ambulatory Visit: Payer: Self-pay | Admitting: *Deleted

## 2022-09-03 ENCOUNTER — Encounter: Payer: Self-pay | Admitting: Oncology

## 2022-09-03 ENCOUNTER — Telehealth: Payer: Self-pay

## 2022-09-03 DIAGNOSIS — E039 Hypothyroidism, unspecified: Secondary | ICD-10-CM | POA: Diagnosis not present

## 2022-09-03 DIAGNOSIS — Z9981 Dependence on supplemental oxygen: Secondary | ICD-10-CM | POA: Diagnosis not present

## 2022-09-03 DIAGNOSIS — J9811 Atelectasis: Secondary | ICD-10-CM | POA: Diagnosis not present

## 2022-09-03 DIAGNOSIS — I5033 Acute on chronic diastolic (congestive) heart failure: Secondary | ICD-10-CM | POA: Diagnosis not present

## 2022-09-03 DIAGNOSIS — C9590 Leukemia, unspecified not having achieved remission: Secondary | ICD-10-CM | POA: Diagnosis not present

## 2022-09-03 DIAGNOSIS — D696 Thrombocytopenia, unspecified: Secondary | ICD-10-CM | POA: Diagnosis not present

## 2022-09-03 DIAGNOSIS — C8300 Small cell B-cell lymphoma, unspecified site: Secondary | ICD-10-CM | POA: Diagnosis not present

## 2022-09-03 DIAGNOSIS — A419 Sepsis, unspecified organism: Secondary | ICD-10-CM | POA: Diagnosis not present

## 2022-09-03 DIAGNOSIS — Z7952 Long term (current) use of systemic steroids: Secondary | ICD-10-CM | POA: Diagnosis not present

## 2022-09-03 DIAGNOSIS — D630 Anemia in neoplastic disease: Secondary | ICD-10-CM | POA: Diagnosis not present

## 2022-09-03 DIAGNOSIS — J441 Chronic obstructive pulmonary disease with (acute) exacerbation: Secondary | ICD-10-CM | POA: Diagnosis not present

## 2022-09-03 DIAGNOSIS — I493 Ventricular premature depolarization: Secondary | ICD-10-CM | POA: Diagnosis not present

## 2022-09-03 DIAGNOSIS — Z87891 Personal history of nicotine dependence: Secondary | ICD-10-CM | POA: Diagnosis not present

## 2022-09-03 DIAGNOSIS — Z9181 History of falling: Secondary | ICD-10-CM | POA: Diagnosis not present

## 2022-09-03 DIAGNOSIS — E119 Type 2 diabetes mellitus without complications: Secondary | ICD-10-CM | POA: Diagnosis not present

## 2022-09-03 DIAGNOSIS — Z794 Long term (current) use of insulin: Secondary | ICD-10-CM | POA: Diagnosis not present

## 2022-09-03 DIAGNOSIS — J9621 Acute and chronic respiratory failure with hypoxia: Secondary | ICD-10-CM | POA: Diagnosis not present

## 2022-09-03 NOTE — Patient Outreach (Signed)
  Care Coordination   Follow Up Visit Note   09/03/2022 Name: Natasha [name] MRN: [id number] DOB: Apr 16, 1946  Natasha [name] is a 76 y.o. year old female who sees [name], NP for primary care. I spoke with  Natasha [name] by phone today.  What matters to the patients health and wellness today?  In home care resources    Goals Addressed             This Visit's Progress    In home care needs       Care Coordination Interventions: Follow up phone call to patient's caregiver regarding patient's in home care needs Patient's caregiver confirms that Adult Protective services remains involved, mental status exam completed-results pending, per caregiver still waiting on an update-last visit with APS was Monday to gather additional financial records-investigation still pending Patient's caregiver confirms that the [facility]  nurse came out yesterday to do intake, PT coming today at 5pm, palliative care came yesterday Patient discussed  continued need for respite care: Referral for respite care through [facility] completed. Self Care Plan discussed to manage caregiver stress: will separate self during stressful moments, discussed follow up with [facility]-patient was able to schedule appointment during call as previous therapist is no longer in network Patient waiting on approval for both Para-Transit and CAP services-this social worker will follow up on status of these referrals Confirmed phone call with the  medical alert program through [facility] they do not have additional camera options through donation at this time.          SDOH assessments and interventions completed:  No     Care Coordination Interventions Activated:  Yes  Care Coordination Interventions:  Yes, provided   Follow up plan: Follow up call scheduled for 09/13/20    Encounter Outcome:  Pt. Visit Completed

## 2022-09-03 NOTE — Patient Instructions (Signed)
Visit Information  Thank you for taking time to visit with me today. Please don't hesitate to contact me if I can be of assistance to you.   Following are the goals we discussed today:   Goals Addressed             This Visit's Progress    In home care needs       Care Coordination Interventions: Follow up phone call to patient's caregiver regarding patient's in home care needs Patient's caregiver confirms that Adult Protective services remains involved, mental status exam completed-results pending, per caregiver still waiting on an update-last visit with APS was Monday to gather additional financial records-investigation still pending Patient's caregiver confirms that the [facility]  nurse came out yesterday to do intake, PT coming today at 5pm, palliative care came yesterday Patient discussed  continued need for respite care: Referral for respite care through [facility] completed. Self Care Plan discussed to manage caregiver stress: will separate self during stressful moments, discussed follow up with [facility]-patient was able to schedule appointment during call as previous therapist is no longer in network Patient waiting on approval for both Para-Transit and CAP services-this social worker will follow up on status of these referrals Confirmed phone call with the  medical alert program through [facility] they do not have additional camera options through donation at this time.          Our next appointment is by telephone on 09/14/22 at 9am  Please call the care guide team at [phone] if you need to cancel or reschedule your appointment.   If you are experiencing a Mental Health or [place] or need someone to talk to, please call 911   Patient verbalizes understanding of instructions and care plan provided today and agrees to view in [place]. Active MyChart status and patient understanding of how to access instructions and care plan via MyChart  confirmed with patient.     Telephone follow up appointment with care management team member scheduled for: 09/14/22  Elliot Gurney, [place] Worker  [facility] Care Management [phone]

## 2022-09-03 NOTE — Patient Outreach (Addendum)
  Care Coordination   09/03/2022 Name: Natasha [name] MRN: [id number] DOB: 1946/01/24   Care Coordination Outreach Attempts:  An unsuccessful telephone outreach was attempted today to offer the patient information about available care coordination services as a benefit of their health plan.  HIPAA compliant message left for caregiver [name] with call back phone number.  Telephone call to [place] navigator, [name].  Unable to reach. HIPAA compliant message left with call back phone number.   Follow Up Plan:  Additional outreach attempts will be made to offer the patient care coordination information and services.   Encounter Outcome:  No Answer  Care Coordination Interventions Activated:  No   Care Coordination Interventions:  No, not indicated    Quinn Plowman RN,BSN,CCM [place] [phone] direct line

## 2022-09-03 NOTE — Progress Notes (Signed)
PATIENT NAME: Natasha [name] DOB: 1946/07/19 MRN: [id number]  PRIMARY CARE PROVIDER: [name], NP  RESPONSIBLE PARTY:  Acct ID - Guarantor Home Phone Work Phone Relationship Acct Type  [id number] Natasha [name], [phone]  Self P/F     [address], [place], [place] [address]    Home visit completed with patient and son Natasha [name].    Appetite:  Patient endorses a poor appetite most days.  Son is managing diet based on recommendations by providers.  Leaning towards mostly plant based options per son.  DM:  Blood sugars in the upper 200's low 300.  Currently on steroid treatment for recent COPD exacerbation. Patient checks blood sugars frequently.  Often declines to eat when levels are elevated.  Discussed side effects of steroid use.  Son has already sent DexCom readings to endocrinologist to address.  Edema:  Patient with 1-2 + pitting edema present from bilateral feet to bilateral thighs.  Patient denies any issues with shortness of breath at rest.  Reports thoracentesis recently.  Medication Management:  Son is currently managing medications.  Patient has received her 2 dose of Calquence per son.  Ongoing education on side effects.    Program:  Discussed differences between [facility] and Hospice as family is asking about respite care.  Patient confirmed her desire to remain under PC and does not wish to purse hospice.  She currently has [facility] following her as well.  We discussed purpose and frequency of visits with PC.  Patient agreeable to stay with PC.  Resources:  Given discussion on respite care, I have referred patient and son Natasha [name] to [facility] for additional resources.  Also discussed [facility] as son is needing gloves to care for patient.  Will have Palliative Care SW also follow up to assist with any additional resources.    CODE STATUS: Full ADVANCED DIRECTIVES: Yes MOST FORM: No PPS: 50%   PHYSICAL EXAM:   VITALS: Today's Vitals   09/02/22 1343  BP:  118/64  Pulse: 81  Temp: (!) 97.3 F (36.3 C)  SpO2: 97%    LUNGS: clear to auscultation  CARDIAC: Cor RRR}  EXTREMITIES: 1-2+ pitting edema present from bilateral feet to bilateral thighs. SKIN: Skin color, texture, turgor normal. No rashes or lesions or temperature normal and texture normal  NEURO: positive for gait problems       Lorenza Burton, RN

## 2022-09-04 DIAGNOSIS — E1165 Type 2 diabetes mellitus with hyperglycemia: Secondary | ICD-10-CM | POA: Diagnosis not present

## 2022-09-04 DIAGNOSIS — C8590 Non-Hodgkin lymphoma, unspecified, unspecified site: Secondary | ICD-10-CM | POA: Diagnosis not present

## 2022-09-04 DIAGNOSIS — J449 Chronic obstructive pulmonary disease, unspecified: Secondary | ICD-10-CM | POA: Diagnosis not present

## 2022-09-06 ENCOUNTER — Other Ambulatory Visit (HOSPITAL_COMMUNITY): Payer: Self-pay

## 2022-09-06 DIAGNOSIS — E1165 Type 2 diabetes mellitus with hyperglycemia: Secondary | ICD-10-CM | POA: Diagnosis not present

## 2022-09-06 DIAGNOSIS — D630 Anemia in neoplastic disease: Secondary | ICD-10-CM | POA: Diagnosis not present

## 2022-09-06 DIAGNOSIS — Z9981 Dependence on supplemental oxygen: Secondary | ICD-10-CM | POA: Diagnosis not present

## 2022-09-06 DIAGNOSIS — Z7952 Long term (current) use of systemic steroids: Secondary | ICD-10-CM | POA: Diagnosis not present

## 2022-09-06 DIAGNOSIS — E119 Type 2 diabetes mellitus without complications: Secondary | ICD-10-CM | POA: Diagnosis not present

## 2022-09-06 DIAGNOSIS — C8300 Small cell B-cell lymphoma, unspecified site: Secondary | ICD-10-CM | POA: Diagnosis not present

## 2022-09-06 DIAGNOSIS — A419 Sepsis, unspecified organism: Secondary | ICD-10-CM | POA: Diagnosis not present

## 2022-09-06 DIAGNOSIS — Z87891 Personal history of nicotine dependence: Secondary | ICD-10-CM | POA: Diagnosis not present

## 2022-09-06 DIAGNOSIS — J9811 Atelectasis: Secondary | ICD-10-CM | POA: Diagnosis not present

## 2022-09-06 DIAGNOSIS — D696 Thrombocytopenia, unspecified: Secondary | ICD-10-CM | POA: Diagnosis not present

## 2022-09-06 DIAGNOSIS — C8590 Non-Hodgkin lymphoma, unspecified, unspecified site: Secondary | ICD-10-CM | POA: Diagnosis not present

## 2022-09-06 DIAGNOSIS — I5033 Acute on chronic diastolic (congestive) heart failure: Secondary | ICD-10-CM | POA: Diagnosis not present

## 2022-09-06 DIAGNOSIS — J449 Chronic obstructive pulmonary disease, unspecified: Secondary | ICD-10-CM | POA: Diagnosis not present

## 2022-09-06 DIAGNOSIS — R1010 Upper abdominal pain, unspecified: Secondary | ICD-10-CM | POA: Diagnosis not present

## 2022-09-06 DIAGNOSIS — Z9181 History of falling: Secondary | ICD-10-CM | POA: Diagnosis not present

## 2022-09-06 DIAGNOSIS — C9590 Leukemia, unspecified not having achieved remission: Secondary | ICD-10-CM | POA: Diagnosis not present

## 2022-09-06 DIAGNOSIS — E039 Hypothyroidism, unspecified: Secondary | ICD-10-CM | POA: Diagnosis not present

## 2022-09-06 DIAGNOSIS — Z794 Long term (current) use of insulin: Secondary | ICD-10-CM | POA: Diagnosis not present

## 2022-09-06 DIAGNOSIS — J441 Chronic obstructive pulmonary disease with (acute) exacerbation: Secondary | ICD-10-CM | POA: Diagnosis not present

## 2022-09-06 DIAGNOSIS — J9621 Acute and chronic respiratory failure with hypoxia: Secondary | ICD-10-CM | POA: Diagnosis not present

## 2022-09-06 DIAGNOSIS — I493 Ventricular premature depolarization: Secondary | ICD-10-CM | POA: Diagnosis not present

## 2022-09-07 ENCOUNTER — Inpatient Hospital Stay: Payer: Medicare Other | Admitting: Pharmacist

## 2022-09-07 ENCOUNTER — Inpatient Hospital Stay (HOSPITAL_BASED_OUTPATIENT_CLINIC_OR_DEPARTMENT_OTHER): Payer: Medicare Other | Admitting: Oncology

## 2022-09-07 ENCOUNTER — Inpatient Hospital Stay: Payer: Medicare Other | Attending: Oncology

## 2022-09-07 ENCOUNTER — Inpatient Hospital Stay: Payer: Medicare Other

## 2022-09-07 ENCOUNTER — Encounter: Payer: Self-pay | Admitting: Oncology

## 2022-09-07 DIAGNOSIS — Z79624 Long term (current) use of inhibitors of nucleotide synthesis: Secondary | ICD-10-CM | POA: Diagnosis not present

## 2022-09-07 DIAGNOSIS — Z79899 Other long term (current) drug therapy: Secondary | ICD-10-CM | POA: Diagnosis not present

## 2022-09-07 DIAGNOSIS — Z8249 Family history of ischemic heart disease and other diseases of the circulatory system: Secondary | ICD-10-CM | POA: Insufficient documentation

## 2022-09-07 DIAGNOSIS — D61818 Other pancytopenia: Secondary | ICD-10-CM | POA: Diagnosis not present

## 2022-09-07 DIAGNOSIS — Z87891 Personal history of nicotine dependence: Secondary | ICD-10-CM | POA: Diagnosis not present

## 2022-09-07 DIAGNOSIS — D649 Anemia, unspecified: Secondary | ICD-10-CM | POA: Diagnosis not present

## 2022-09-07 DIAGNOSIS — J449 Chronic obstructive pulmonary disease, unspecified: Secondary | ICD-10-CM | POA: Insufficient documentation

## 2022-09-07 DIAGNOSIS — C8590 Non-Hodgkin lymphoma, unspecified, unspecified site: Secondary | ICD-10-CM | POA: Diagnosis not present

## 2022-09-07 DIAGNOSIS — Z79890 Hormone replacement therapy (postmenopausal): Secondary | ICD-10-CM | POA: Diagnosis not present

## 2022-09-07 DIAGNOSIS — Z818 Family history of other mental and behavioral disorders: Secondary | ICD-10-CM | POA: Insufficient documentation

## 2022-09-07 DIAGNOSIS — J90 Pleural effusion, not elsewhere classified: Secondary | ICD-10-CM | POA: Insufficient documentation

## 2022-09-07 DIAGNOSIS — C8580 Other specified types of non-Hodgkin lymphoma, unspecified site: Secondary | ICD-10-CM

## 2022-09-07 DIAGNOSIS — E1165 Type 2 diabetes mellitus with hyperglycemia: Secondary | ICD-10-CM | POA: Diagnosis not present

## 2022-09-07 DIAGNOSIS — R161 Splenomegaly, not elsewhere classified: Secondary | ICD-10-CM | POA: Diagnosis not present

## 2022-09-07 DIAGNOSIS — R590 Localized enlarged lymph nodes: Secondary | ICD-10-CM | POA: Diagnosis not present

## 2022-09-07 DIAGNOSIS — J948 Other specified pleural conditions: Secondary | ICD-10-CM | POA: Diagnosis not present

## 2022-09-07 DIAGNOSIS — Z801 Family history of malignant neoplasm of trachea, bronchus and lung: Secondary | ICD-10-CM | POA: Insufficient documentation

## 2022-09-07 DIAGNOSIS — Z5941 Food insecurity: Secondary | ICD-10-CM | POA: Insufficient documentation

## 2022-09-07 DIAGNOSIS — Z803 Family history of malignant neoplasm of breast: Secondary | ICD-10-CM | POA: Diagnosis not present

## 2022-09-07 DIAGNOSIS — C8309 Small cell B-cell lymphoma, extranodal and solid organ sites: Secondary | ICD-10-CM | POA: Insufficient documentation

## 2022-09-07 DIAGNOSIS — E119 Type 2 diabetes mellitus without complications: Secondary | ICD-10-CM | POA: Diagnosis not present

## 2022-09-07 DIAGNOSIS — R1010 Upper abdominal pain, unspecified: Secondary | ICD-10-CM | POA: Diagnosis not present

## 2022-09-07 LAB — COMPREHENSIVE METABOLIC PANEL
ALT: 21 U/L (ref 0–44)
AST: 27 U/L (ref 15–41)
Albumin: 2.8 g/dL — ABNORMAL LOW (ref 3.5–5.0)
Alkaline Phosphatase: 130 U/L — ABNORMAL HIGH (ref 38–126)
Anion gap: 5 (ref 5–15)
BUN: 37 mg/dL — ABNORMAL HIGH (ref 8–23)
CO2: 27 mmol/L (ref 22–32)
Calcium: 8.1 mg/dL — ABNORMAL LOW (ref 8.9–10.3)
Chloride: 106 mmol/L (ref 98–111)
Creatinine, Ser: 0.75 mg/dL (ref 0.44–1.00)
GFR, Estimated: >60 mL/min (ref >60)
Glucose, Bld: 187 mg/dL — ABNORMAL HIGH (ref 70–99)
Potassium: 4.2 mmol/L (ref 3.5–5.1)
Sodium: 138 mmol/L (ref 135–145)
Total Bilirubin: 1.3 mg/dL — ABNORMAL HIGH (ref 0.3–1.2)
Total Protein: 4.8 g/dL — ABNORMAL LOW (ref 6.5–8.1)

## 2022-09-07 LAB — CBC WITH DIFFERENTIAL/PLATELET
Abs Immature Granulocytes: 0.07 10*3/uL (ref 0.00–0.07)
Basophils Absolute: 0.1 10*3/uL (ref 0.0–0.1)
Basophils Relative: 1 %
Eosinophils Absolute: 0.1 10*3/uL (ref 0.0–0.5)
Eosinophils Relative: 1 %
HCT: 24.9 % — ABNORMAL LOW (ref 36.0–46.0)
Hemoglobin: 7.1 g/dL — ABNORMAL LOW (ref 12.0–15.0)
Immature Granulocytes: 1 %
Lymphocytes Relative: 82 %
Lymphs Abs: 7.2 10*3/uL — ABNORMAL HIGH (ref 0.7–4.0)
MCH: 26.8 pg (ref 26.0–34.0)
MCHC: 28.5 g/dL — ABNORMAL LOW (ref 30.0–36.0)
MCV: 94 fL (ref 80.0–100.0)
Monocytes Absolute: 0.6 10*3/uL (ref 0.1–1.0)
Monocytes Relative: 7 %
Neutro Abs: 0.7 10*3/uL — ABNORMAL LOW (ref 1.7–7.7)
Neutrophils Relative %: 8 %
Platelets: 55 10*3/uL — ABNORMAL LOW (ref 150–400)
RBC: 2.65 MIL/uL — ABNORMAL LOW (ref 3.87–5.11)
RDW: 18 % — ABNORMAL HIGH (ref 11.5–15.5)
Smear Review: NORMAL
WBC: 8.7 10*3/uL (ref 4.0–10.5)
nRBC: 0.5 % — ABNORMAL HIGH (ref 0.0–0.2)

## 2022-09-07 LAB — SAMPLE TO BLOOD BANK

## 2022-09-07 LAB — LACTATE DEHYDROGENASE: LDH: 212 U/L — ABNORMAL HIGH (ref 98–192)

## 2022-09-07 LAB — PHOSPHORUS: Phosphorus: 4 mg/dL (ref 2.5–4.6)

## 2022-09-07 LAB — URIC ACID: Uric Acid, Serum: 6 mg/dL (ref 2.5–7.1)

## 2022-09-07 MED ORDER — SODIUM CHLORIDE 0.9% FLUSH
10.0000 mL | Freq: Once | INTRAVENOUS | Status: AC
  Administered 2022-09-07: 10 mL via INTRAVENOUS
  Filled 2022-09-07: qty 10

## 2022-09-07 MED ORDER — SODIUM CHLORIDE 0.9 % IV SOLN
Freq: Once | INTRAVENOUS | Status: AC
  Filled 2022-09-07: qty 250

## 2022-09-07 MED ORDER — HEPARIN SOD (PORK) LOCK FLUSH 100 UNIT/ML IV SOLN
500.0000 [IU] | Freq: Once | INTRAVENOUS | Status: AC
  Administered 2022-09-07: 500 [IU] via INTRAVENOUS
  Filled 2022-09-07: qty 5

## 2022-09-07 NOTE — Progress Notes (Signed)
[place]  Telephone:([phone] [phone]  Patient Care Team: [name], NP as PCP - General (Nurse Practitioner) [name], RN (Inactive) as Case Manager [name], RN as [place] Management   Name of the patient: Natasha [name]  [id number]  1946/08/05   Date of visit: 09/07/22  HPI: Patient is a 76 y.o. 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 Per MD documenation, patient was hesitant to want to proceed with bendamustine/rituxamab, so acalabrutinib was selected as an alternative.    Patient started acalabrutinib on 09/01/22.  Reason for Consult: Oral chemotherapy follow-up for acalabrutinib therapy.   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; [name]; 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[place]  09/08/2018 Initial Diagnosis   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[facility]   Marginal zone lymphoma ([facility]  07/28/2022 Initial Diagnosis   [place]   08/01/2022 Cancer Staging   Staging form: Hodgkin and Non-Hodgkin Lymphoma, AJCC 8th Edition - Clinical  stage from 08/01/2022: Stage IV (Marginal zone lymphoma) - Signed by RSindy Guadeloupe MD on 08/01/2022   08/17/2022 -  Chemotherapy   Patient is on Treatment Plan : NON-HODGKINS LYMPHOMA Rituximab D1 + Bendamustine D1,2 q28d x 6 cycles       ALLERGIES:  has No Known Allergies.  MEDICATIONS:  Current Outpatient Medications  Medication Sig Dispense Refill   acalabrutinib maleate (CALQUENCE) 100 MG tablet Take 1 tablet (100 mg total) by mouth 2 (two) times daily. 60 tablet 1   acyclovir (ZOVIRAX) 400 MG tablet Take 400 mg by mouth daily.     albuterol (VENTOLIN HFA) 108 (90 Base) MCG/ACT inhaler TAKE 2 PUFFS BY MOUTH EVERY 6 HOURS AS NEEDED FOR WHEEZE OR SHORTNESS OF BREATH 18 g 1   clobetasol cream (TEMOVATE) 06.22% Apply 1 Application topically daily.     HYDROcodone-acetaminophen (NORCO) 5-325 MG tablet Take 1 tablet by mouth every 8 (eight) hours as needed for moderate pain. (Patient not taking: Reported on 09/07/2022) 15 tablet 0   insulin NPH-regular Human (70-30) 100 UNIT/ML injection Inject 5 Units into the skin 2 (two) times daily with a meal. Use only for hyperglycemia, CBG+ 300. Start with 5 units, titrate up to max 10 unit per dose if need. (Patient taking differently: Inject 10 Units into the skin 2 (two) times daily with a meal. Use only for hyperglycemia, CBG+ 300. Start with 5 units, titrate up to max 10 unit per dose if need.) 10 mL 0   insulin regular (NOVOLIN R) 100 units/mL injection Inject 5-10 Units into the skin 3 (three) times daily before meals. BG 200-299 give  5 units BG 300-399 give 10 units.     Ipratropium-Albuterol (COMBIVENT RESPIMAT) 20-100 MCG/ACT AERS respimat Inhale 1 puff into the lungs 3 (three) times daily.     levothyroxine (SYNTHROID) 50 MCG tablet Take 1 tablet (50 mcg total) by mouth daily at 6 (six) AM. 30 tablet 0   lidocaine-prilocaine (EMLA) cream Apply to affected area once (Patient taking differently: 1 Application. Apply to mediport site 2 hours before  infusion) 30 g 3   loperamide (IMODIUM) 2 MG capsule Take 2 mg by mouth as needed for diarrhea or loose stools.     nicotine (NICODERM CQ - DOSED IN MG/24 HOURS) 21 mg/24hr patch Place 21 mg onto the skin daily.     ondansetron (ZOFRAN) 8 MG tablet Take 8 mg by mouth every 8 (eight) hours as needed for nausea, vomiting or refractory nausea / vomiting. Take 1 tablet q6H for 4 days after chemotherapy.     prochlorperazine (COMPAZINE) 10 MG tablet Take 1 tablet by mouth every 6 (six) hours as needed for nausea or vomiting. Take 1 tab q6H for 4 days after chemotherapy.     simethicone (MYLICON) 80 MG chewable tablet Chew 180 mg by mouth every 6 (six) hours as needed for flatulence. Take 1 capsule PO PRN gas relief.     No current facility-administered medications for this visit.   Facility-Administered Medications Ordered in Other Visits  Medication Dose Route Frequency Provider Last Rate Last Admin   0.9 %  sodium chloride infusion   Intravenous Once Sindy Guadeloupe, MD 999 mL/hr at 09/07/22 1028 New Bag at 09/07/22 1028   heparin lock flush 100 unit/mL  500 Units Intravenous Once Sindy Guadeloupe, MD        VITAL SIGNS: There were no vitals taken for this visit. There were no vitals filed for this visit.  Estimated body mass index is 26.19 kg/m as calculated from the following:   Height as of 08/29/22: '5\' 5"'$  (1.651 m).   Weight as of 08/31/22: 71.4 kg (157 lb 6.5 oz).  LABS: CBC:    Component Value Date/Time   WBC 8.7 09/07/2022 0853   HGB 7.1 (L) 09/07/2022 0853   HGB 8.0 (L) 06/22/2022 0939   HCT 24.9 (L) 09/07/2022 0853   HCT 26.6 (L) 06/22/2022 0939   PLT 55 (L) 09/07/2022 0853   PLT 77 (LL) 06/22/2022 0939   MCV 94.0 09/07/2022 0853   MCV 93 06/22/2022 0939   NEUTROABS 0.7 (L) 09/07/2022 0853   NEUTROABS 0.4 (LL) 06/22/2022 0939   LYMPHSABS 7.2 (H) 09/07/2022 0853   LYMPHSABS 3.1 06/22/2022 0939   MONOABS 0.6 09/07/2022 0853   EOSABS 0.1 09/07/2022 0853   EOSABS 0.1 06/22/2022  0939   BASOSABS 0.1 09/07/2022 0853   BASOSABS 0.0 06/22/2022 0939   Comprehensive Metabolic Panel:    Component Value Date/Time   NA 138 09/07/2022 0853   NA 143 06/22/2022 0939   K 4.2 09/07/2022 0853   CL 106 09/07/2022 0853   CO2 27 09/07/2022 0853   BUN 37 (H) 09/07/2022 0853   BUN 21 06/22/2022 0939   CREATININE 0.75 09/07/2022 0853   CREATININE 0.92 07/07/2018 0955   GLUCOSE 187 (H) 09/07/2022 0853   CALCIUM 8.1 (L) 09/07/2022 0853   AST 27 09/07/2022 0853   ALT 21 09/07/2022 0853   ALKPHOS 130 (H) 09/07/2022 0853   BILITOT 1.3 (H) 09/07/2022 0853   BILITOT 2.0 (H) 06/22/2022 0939   PROT 4.8 (L) 09/07/2022 7017  PROT 4.9 (L) 06/22/2022 0939   ALBUMIN 2.8 (L) 09/07/2022 0853   ALBUMIN 3.4 (L) 06/22/2022 0939     Present during today's visit: patient and caregiver [name]  Assessment and Plan: Continue acalabrutinib '100mg'$  bid   Oral Chemotherapy Side Effect/Intolerance:  Headache: patient has a headache that started yesterday. She has not had anything with caffeine since the headache started. [name], RN went to get her a coffee while she was in clinic this morning Fatigue: moderate, likely due to disease she has not had an increase in fatigue since starting the acalabrutinib No diarrhea reported  Oral Chemotherapy Adherence: No missed doses No patient barriers to medication adherence identified.   New medications: none reported  Medication Access Issues: No issues, patient fills at [facility]Special[facility])  Patient expressed understanding and was in agreement with this plan. She also understands that She can call clinic at any time with any questions, concerns, or complaints.   Follow-up plan: RTC in 1 week for lab and 2 week for clinic appt  Thank you for allowing me to participate in the care of this very pleasant patient.   Time Total: 15 mins  Visit consisted of counseling and education on dealing with issues of symptom management in the setting  of serious and potentially life-threatening illness.Greater than 50%  of this time was spent counseling and coordinating care related to the above assessment and plan.  Signed by: Darl Pikes, PharmD, BCPS, Salley Slaughter, CPP Hematology/Oncology Clinical Pharmacist Practitioner [place]/DB/AP Oral [facility] [phone]  09/07/2022 11:19 AM

## 2022-09-07 NOTE — Progress Notes (Signed)
Pt wants to eat what she wants to eat fruit and red meat and she has CHF and diabetes and [name] who lives with her and takes

## 2022-09-07 NOTE — Progress Notes (Signed)
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  Her most recent CBC from 06/22/2022 showed white count of 4.3, H&H of 8/26.6 with an MCV of 93 and a platelet count of 77.  Looking back at her prior CBCs her hemoglobin was normal at 13.3 until November 2020 and since June 2023 her hemoglobin has been fluctuating between 8.5-9.5.  Platelets were normal up until November 2020 but fluctuating between 70-80 since June 2023.  Her hemoglobin was normal at 13 in May 2021 as well and we do not have any labs between 2021 and 2023.  Patient states that she was diagnosed with a possible lymphoma a few years ago in [place] but was told that she does not require any treatment   Patient had a CT chest abdomen and pelvis with contrast which showed bulky bilateral axillary mediastinal and hilar adenopathy with the largest axillary node measuring 3.3 x 2.5 cm as compared to 1.7 cm back in 2020.  Severe splenomegaly of 21.9 cm.  Bulky intra-abdominal adenopathy.  Moderate right pleural effusion   Flow cytometry from peripheral blood that was done at [facility] showed clonal B cells which was CD5 negative CD10 negative CD103 negative and CD123 negative.  Differential diagnosis includes marginal zone lymphoma, lymphoplasmacytic lymphoma, DLBCL and atypical CLL.  Blood  smear shows a small to medium lymphoid cells with irregular nuclear contours containing moderately condensed chromatin.  Large abnormal lymphocytes are rare.  Taken together these morphological and immunophenotypic findings favor the diagnosis of marginal zone lymphoma.   Patient had pleural fluid tapped as well and cytology was again consistent with involvement with patient's known B-cell lymphoma consistent with peripheral blood flow cytometry pathology.  Plan was to offer Bendamustine Rituxan chemotherapy for 6 cycles.  However patient had repeated episodes of hospitalization for respiratory failure and worsening performance status.  Plan was therefore made to proceed with acalabrutinib instead.  She started taking that on 08/31/2022  Interval history-patient is tolerating acalabrutinib well so far and has not missed any doses.  She has baseline fatigue and exertional shortness of breath.  She is on 4 L of oxygen.  Overall feels somewhat better after she has been discharged from the hospital.  ECOG PS- 2-3 Pain scale- 2 Opioid associated constipation- no  Review of systems- Review of Systems  Constitutional:  Positive for malaise/fatigue. Negative for chills, fever and weight loss.  HENT:  Negative for congestion, ear discharge and nosebleeds.   Eyes:  Negative for blurred vision.  Respiratory:  Positive for shortness of breath. Negative for cough, hemoptysis, sputum production and wheezing.   Cardiovascular:  Negative for chest pain, palpitations, orthopnea and claudication.  Gastrointestinal:  Negative for abdominal pain, blood in stool, constipation, diarrhea, heartburn, melena, nausea and vomiting.  Genitourinary:  Negative for dysuria, flank pain, frequency, hematuria and urgency.  Musculoskeletal:  Negative for back pain, joint pain and myalgias.  Skin:  Negative for rash.  Neurological:  Negative for dizziness, tingling, focal weakness, seizures, weakness and headaches.   Endo/Heme/Allergies:  Does not bruise/bleed easily.  Psychiatric/Behavioral:  Negative for depression and suicidal ideas. The patient does not have insomnia.       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   CHF (congestive heart failure) (HCC)    COPD (chronic obstructive pulmonary disease) (HCC)    Diabetes mellitus without complication (HCC)    Hypotension    Pleural effusion    [facility] 847m,  2 weeks ago   Vaginal delivery    x 5     Past Surgical History:  Procedure Laterality Date   IR IMAGING GUIDED PORT INSERTION  08/16/2022   TONSILLECTOMY Bilateral    as a child    Social History   Socioeconomic History   Marital status: Widowed    Spouse name: Not on file   Number of children: 2   Years of education: Not on file   Highest education level: 9th grade  Occupational History   Occupation: unemployed  Tobacco Use   Smoking status: Former    Packs/day: 2.00    Years: 55.00    Total pack years: 110.00    Types: Cigarettes    Start date: 07/07/1961    Quit date: 08/02/2022    Years since quitting: 0.0   Smokeless tobacco: Never   Tobacco comments:    1/2 pack she states but family says 2 PPD  Vaping Use   Vaping Use: Never used  Substance and Sexual Activity   Alcohol use: No   Drug use: Never   Sexual activity: Not Currently    Partners: Male    Birth control/protection: Post-menopausal  Other Topics Concern   Not on file  Social History Narrative   08/14/20   [place]  Living: to be near daughter   Work: retired      FPhysiological scientistchildren - JIT consultant(nearby) and son JEvelena Peat[place]in [place]  8 grandchildren, & 2 great-grandchildren.      Enjoys: stays at home      Exercise: walking to her daughter's store   Diet: eats fruit, air fried chicken      Safety   Seat belts: Yes    Guns: No   Safe in relationships: Yes    Social Determinants of Health    Financial Resource Strain: Medium Risk (08/24/2022)   Overall Financial Resource Strain (CARDIA)    Difficulty of Paying Living Expenses: Somewhat hard  Food Insecurity: Food Insecurity Present (08/30/2022)   Hunger Vital Sign    Worried About Running Out of Food in the Last Year: Sometimes true    Ran Out of Food in the Last Year: Sometimes true  Transportation Needs: Unmet Transportation Needs (08/30/2022)   PRAPARE - THydrologist(Medical): Yes    Lack of Transportation (Non-Medical): No  Physical Activity: Inactive (08/24/2022)   Exercise Vital Sign    Days of Exercise per Week: 0 days    Minutes of Exercise per Session: 0 min  Stress: Stress Concern Present (08/24/2022)   FGarvin   Feeling of Stress : To some extent  Social Connections: Socially Isolated (08/24/2022)   Social Connection and Isolation Panel [NHANES]    Frequency of  Communication with Friends and Family: More than three times a week    Frequency of Social Gatherings with Friends and Family: More than three times a week    Attends Religious Services: Never    Marine scientist or Organizations: No    Attends Archivist Meetings: Never    Marital Status: Widowed  Intimate Partner Violence: Not At Risk (08/30/2022)   Humiliation, Afraid, Rape, and Kick questionnaire    Fear of Current or Ex-Partner: No    Emotionally Abused: No    Physically Abused: No    Sexually Abused: No    Family History  Problem Relation Age of Onset   Breast cancer Mother    Alzheimer's disease Father    Lung cancer Father        lung   Varicose Veins Brother    Heart attack Brother      Current Outpatient Medications:    acalabrutinib maleate (CALQUENCE) 100 MG tablet, Take 1 tablet (100 mg total) by mouth 2 (two) times daily., Disp: 60 tablet, Rfl: 1   acyclovir (ZOVIRAX) 400 MG tablet, Take 400 mg by mouth daily.,  Disp: , Rfl:    albuterol (VENTOLIN HFA) 108 (90 Base) MCG/ACT inhaler, TAKE 2 PUFFS BY MOUTH EVERY 6 HOURS AS NEEDED FOR WHEEZE OR SHORTNESS OF BREATH, Disp: 18 g, Rfl: 1   clobetasol cream (TEMOVATE) 0.38 %, Apply 1 Application topically daily., Disp: , Rfl:    insulin NPH-regular Human (70-30) 100 UNIT/ML injection, Inject 5 Units into the skin 2 (two) times daily with a meal. Use only for hyperglycemia, CBG+ 300. Start with 5 units, titrate up to max 10 unit per dose if need. (Patient taking differently: Inject 10 Units into the skin 2 (two) times daily with a meal. Use only for hyperglycemia, CBG+ 300. Start with 5 units, titrate up to max 10 unit per dose if need.), Disp: 10 mL, Rfl: 0   insulin regular (NOVOLIN R) 100 units/mL injection, Inject 5-10 Units into the skin 3 (three) times daily before meals. BG 200-299 give 5 units BG 300-399 give 10 units., Disp: , Rfl:    Ipratropium-Albuterol (COMBIVENT RESPIMAT) 20-100 MCG/ACT AERS respimat, Inhale 1 puff into the lungs 3 (three) times daily., Disp: , Rfl:    levothyroxine (SYNTHROID) 50 MCG tablet, Take 1 tablet (50 mcg total) by mouth daily at 6 (six) AM., Disp: 30 tablet, Rfl: 0   lidocaine-prilocaine (EMLA) cream, Apply to affected area once (Patient taking differently: 1 Application. Apply to mediport site 2 hours before infusion), Disp: 30 g, Rfl: 3   loperamide (IMODIUM) 2 MG capsule, Take 2 mg by mouth as needed for diarrhea or loose stools., Disp: , Rfl:    nicotine (NICODERM CQ - DOSED IN MG/24 HOURS) 21 mg/24hr patch, Place 21 mg onto the skin daily., Disp: , Rfl:    ondansetron (ZOFRAN) 8 MG tablet, Take 8 mg by mouth every 8 (eight) hours as needed for nausea, vomiting or refractory nausea / vomiting. Take 1 tablet q6H for 4 days after chemotherapy., Disp: , Rfl:    prochlorperazine (COMPAZINE) 10 MG tablet, Take 1 tablet by mouth every 6 (six) hours as needed for nausea or vomiting. Take 1 tab q6H for 4 days after chemotherapy., Disp:  , Rfl:    simethicone (MYLICON) 80 MG chewable tablet, Chew 180 mg by mouth every 6 (six) hours as needed for flatulence. Take 1 capsule PO PRN gas relief., Disp: , Rfl:    HYDROcodone-acetaminophen (  NORCO) 5-325 MG tablet, Take 1 tablet by mouth every 8 (eight) hours as needed for moderate pain. (Patient not taking: Reported on 09/07/2022), Disp: 15 tablet, Rfl: 0  Physical exam: There were no vitals filed for this visit. Physical Exam Constitutional:      Comments: Sitting in a wheelchair.  She is on 4 L of oxygen  Cardiovascular:     Rate and Rhythm: Normal rate and regular rhythm.     Heart sounds: Normal heart sounds.  Pulmonary:     Effort: Pulmonary effort is normal.     Breath sounds: Normal breath sounds.  Abdominal:     General: Bowel sounds are normal.     Palpations: Abdomen is soft.  Musculoskeletal:     Right lower leg: Edema present.     Left lower leg: Edema present.  Lymphadenopathy:     Comments: Bilateral palpable axillary adenopathy  Skin:    General: Skin is warm and dry.  Neurological:     Mental Status: She is alert and oriented to person, place, and time.         Latest Ref Rng & Units 09/07/2022    8:53 AM  CMP  Glucose 70 - 99 mg/dL 187   BUN 8 - 23 mg/dL 37   Creatinine 0.44 - 1.00 mg/dL 0.75   Sodium 135 - 145 mmol/L 138   Potassium 3.5 - 5.1 mmol/L 4.2   Chloride 98 - 111 mmol/L 106   CO2 22 - 32 mmol/L 27   Calcium 8.9 - 10.3 mg/dL 8.1   Total Protein 6.5 - 8.1 g/dL 4.8   Total Bilirubin 0.3 - 1.2 mg/dL 1.3   Alkaline Phos 38 - 126 U/L 130   AST 15 - 41 U/L 27   ALT 0 - 44 U/L 21       Latest Ref Rng & Units 09/07/2022    8:53 AM  CBC  WBC 4.0 - 10.5 K/uL 8.7   Hemoglobin 12.0 - 15.0 g/dL 7.1   Hematocrit 36.0 - 46.0 % 24.9   Platelets 150 - 400 K/uL 55     No images are attached to the encounter.  DG Chest Port 1 View  Result Date: 08/30/2022 CLINICAL DATA:  Post thoracentesis.  RIGHT. EXAM: PORTABLE CHEST 1 VIEW COMPARISON:   IR ultrasound, earlier same day. Chest XR, 08/29/2022. FINDINGS: Support lines: Stable positioning of RIGHT chest port with catheter tip at the superior cavoatrial junction. Overlying pacer leads. Cardiac apex is obscured. Mediastinal silhouette is within normal limits. Lungs are hypoinflated with perihilar opacities and bibasilar consolidations. Interval removal of RIGHT pleural effusion, with small volume residual. Small-to-moderate volume LEFT pleural effusion. No pneumothorax. No interval osseous abnormality. IMPRESSION: 1. No pneumothorax status post RIGHT thoracentesis. 2. Bilateral pleural effusions and basilar consolidations, likely atelectasis. Electronically Signed   By: Michaelle Birks M.D.   On: 08/30/2022 12:29   US THORACENTESIS ASP PLEURAL SPACE W/IMG GUIDE  Result Date: 08/30/2022 INDICATION: History of gastric lymphoma with recurrent pleural effusions. Patient presented to ED with complaint of shortness of breath found to have bilateral pleural effusions. Upon ultrasound exam right greater than left. EXAM: ULTRASOUND GUIDED DIAGNOSTIC AND THERAPEUTIC RIGHT THORACENTESIS MEDICATIONS: 10 mL 1 % lidocaine COMPLICATIONS: None immediate. PROCEDURE: An ultrasound guided thoracentesis was thoroughly discussed with the patient and questions answered. The benefits, risks, alternatives and complications were also discussed. The patient understands and wishes to proceed with the procedure. Written consent was obtained. Ultrasound was performed to localize and  mark an adequate pocket of fluid in the RIGHT chest. The area was then prepped and draped in the normal sterile fashion. 1% Lidocaine was used for local anesthesia. Under ultrasound guidance a 6 Fr Safe-T-Centesis catheter was introduced. Thoracentesis was performed. The catheter was removed and a dressing applied. FINDINGS: A total of approximately 1.2 L of hazy, amber colored fluid was removed. Samples were sent to the laboratory as requested by the  clinical team. IMPRESSION: Successful ultrasound guided right thoracentesis yielding 1.2 L of pleural fluid. Read by: Narda Rutherford, AGNP-BC Electronically Signed   By: Michaelle Birks M.D.   On: 08/30/2022 12:25   DG Chest Portable 1 View  Result Date: 08/29/2022 CLINICAL DATA:  Shortness of breath. EXAM: PORTABLE CHEST 1 VIEW COMPARISON:  08/17/2022 FINDINGS: Moderate to large-sized bilateral pleural effusions, significantly increased. Progressive increase in prominence of the pulmonary vasculature and interstitial markings. Less airspace opacity and volume loss at the lung bases. Stable right jugular porta catheter with its tip in the region of the superior cavoatrial junction or upper right atrium. Unremarkable bones. IMPRESSION: 1. Moderate to large-sized bilateral pleural effusions, significantly increased. 2. Progressive changes of congestive heart failure. 3. Mildly improved bibasilar atelectasis. Electronically Signed   By: Claudie Revering M.D.   On: 08/29/2022 14:17   US THORACENTESIS ASP PLEURAL SPACE W/IMG GUIDE  Result Date: 08/17/2022 INDICATION: Patient with history of gastric lymphoma with recurrent pleural effusions. Request is for therapeutic thoracentesis. EXAM: ULTRASOUND GUIDED THERAPEUTIC RIGHT-SIDED THORACENTESIS MEDICATIONS: Lidocaine 1% 10 mL COMPLICATIONS: None immediate. PROCEDURE: An ultrasound guided thoracentesis was thoroughly discussed with the patient and questions answered. The benefits, risks, alternatives and complications were also discussed. The patient understands and wishes to proceed with the procedure. Written consent was obtained. Ultrasound was performed to localize and mark an adequate pocket of fluid in the RIGHT chest. The area was then prepped and draped in the normal sterile fashion. 1% Lidocaine was used for local anesthesia. Under ultrasound guidance a 6 Fr Safe-T-Centesis catheter was introduced. Thoracentesis was performed. The catheter was removed and a dressing  applied. FINDINGS: A total of approximately 850 mL of straw-colored fluid was removed. IMPRESSION: Successful ultrasound guided therapeutic RIGHT-sided thoracentesis yielding 850 mL of pleural fluid. Read by: Rushie Nyhan, NP Electronically Signed   By: Michaelle Birks M.D.   On: 08/17/2022 13:23   DG Chest Port 1 View  Result Date: 08/17/2022 CLINICAL DATA:  Post right-sided thoracentesis EXAM: PORTABLE CHEST 1 VIEW COMPARISON:  08/16/2022; 08/03/2022; chest CT-07/12/2022 FINDINGS: Grossly unchanged enlarged cardiac silhouette and mediastinal contours. Stable positioning of support apparatus. Interval reduction in persistent small right-sided effusion post thoracentesis. No pneumothorax. Improved aeration the right lung base with persistent right basilar heterogeneous/consolidative opacities. Unchanged small left-sided effusion associated left basilar opacities. Pulmonary vasculature remains indistinct with cephalization of flow. No acute osseous abnormalities. IMPRESSION: 1. Interval reduction in persistent small right-sided effusion post thoracentesis. No pneumothorax. 2. Improved aeration the right lung base with otherwise similar findings of pulmonary edema, small left-sided effusion and associated bibasilar opacities, likely atelectasis. Electronically Signed   By: Sandi Mariscal M.D.   On: 08/17/2022 13:12   DG Chest Port 1 View  Result Date: 08/16/2022 CLINICAL DATA:  Shortness of breath. EXAM: PORTABLE CHEST 1 VIEW COMPARISON:  08/03/2022 FINDINGS: Right jugular Port-A-Cath with the tip in the lower SVC region. Markedly increased densities in the mid and lower right chest that could represent a large pleural fluid and/or airspace disease. Again noted are prominent lung markings  bilaterally which are similar to the previous examination. Negative for pneumothorax. Heart size is grossly stable. Slightly increased densities at the left lung base could represent atelectasis or pleural fluid.  IMPRESSION: 1. Markedly increased densities in the mid and lower right chest. Findings could represent a large pleural effusion and/or airspace disease. 2. Prominent lung markings bilaterally are similar to the previous examination and could represent vascular congestion or pulmonary edema. Electronically Signed   By: Markus Daft M.D.   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 A total of Versed 1 mg and Fentanyl 50 mcg was administered intravenously. Moderate Sedation Time: 25 minutes. The patient's level of consciousness and vital signs were monitored continuously by radiology nursing throughout the procedure under my direct supervision. FLUOROSCOPY TIME:  Fluoroscopy Time: 0.5 minutes (5 mGy) COMPLICATIONS: None immediate. PROCEDURE: Informed written consent was obtained from the patient after a thorough discussion of the procedural risks, benefits and alternatives. All questions were addressed. Maximal Sterile Barrier Technique was utilized including caps, mask, sterile gowns, sterile gloves, sterile drape, hand hygiene and skin antiseptic. A timeout was performed prior to the initiation of the procedure. The patient was placed supine on the exam table. The right neck and chest was prepped and draped in the standard sterile fashion. A preliminary ultrasound of the right neck was performed and demonstrates a patent right internal jugular vein. A permanent ultrasound image was stored in the electronic medical record. The overlying skin was anesthetized with 1% Lidocaine. Using ultrasound guidance, access was obtained into the right internal jugular vein using a 21 gauge micropuncture set. A wire was  advanced into the SVC, a short incision was made at the puncture site, and serial dilatation performed. Next, in an ipsilateral infraclavicular location, an incision was made at the site of the subcutaneous reservoir. Blunt dissection was used to open a pocket to contain the reservoir. A subcutaneous tunnel was then created from the port site to the puncture site. A(n) 8 Fr single lumen catheter was advanced through the tunnel. The catheter was attached to the port and this was placed in the subcutaneous pocket. Under fluoroscopic guidance, a peel away sheath was placed, and the catheter was trimmed to the appropriate length and was advanced into the central veins. The catheter length is 24 cm. The tip of the catheter lies near the superior cavoatrial junction. The port flushes and aspirates appropriately. The port was flushed and locked with heparinized saline. The port pocket was closed in 2 layers using 3-0 and 4-0 Vicryl/absorbable suture. Dermabond was also applied to both incisions. The patient was transferred to recovery in stable condition. IMPRESSION: Successful placement of a right chest port via the right internal jugular vein. The port is ready for immediate use. Electronically Signed   By: Albin Felling M.D.   On: 08/16/2022 15:11     Assessment and plan- Patient is a 76 y.o. female with stage IV nodal marginal zone lymphoma currently on acalabrutinib and this is a routine follow-up visit  Patient has been on acalabrutinib for a week so far and tolerating it well.  She has baseline pancytopenia likely secondary to her underlying lymphoma.  Due to her performance status and repeated hospitalizations we have not been able to get a  formal bone marrow biopsy to confirm this.  Hemoglobin is 7.1 today and we will plan to give her 1 unit of PRBC transfusion this week.  Okay to continue acalabrutinib as long as platelets are staying more than 50.  Likely her cytopenias are due to lymphoma rather than the  drug but it is difficult to distinguish between the time.  She has had some baseline thrombocytopenia even prior to starting the drug.  1 L of IV fluids today and continue checking weekly labs.  She will be seen by covering NP in 2 weeks and I will see her back in 4 weeks.  PET scan to be done prior.  We have not been able to get a baseline PET scan for her due to her hospitalizations as well.  Discussed risks and benefits of acalabrutinib including all butLimited to nausea, vomiting, low blood counts, risk of infections and hospitalization.  Patient understands and agrees to proceed as planned.  Although BTK inhibitors have been up proved more in the relapsed setting, and proceeding with that over Bendamustine Rituxan given her performance status and inability to keep up with her follow-up appointments as well as repeated hospitalizations.   Visit Diagnosis 1. Marginal zone lymphoma ([place])   2. High risk medication use   3. Pancytopenia ([place])   4. Symptomatic anemia      Dr. Randa Evens, MD, MPH [facility] at [facility] [phone] 09/07/2022 12:31 PM

## 2022-09-08 ENCOUNTER — Other Ambulatory Visit: Payer: Self-pay | Admitting: *Deleted

## 2022-09-08 ENCOUNTER — Encounter: Payer: Self-pay | Admitting: Oncology

## 2022-09-08 ENCOUNTER — Telehealth: Payer: Self-pay

## 2022-09-08 DIAGNOSIS — C8580 Other specified types of non-Hodgkin lymphoma, unspecified site: Secondary | ICD-10-CM

## 2022-09-08 LAB — PREPARE RBC (CROSSMATCH)

## 2022-09-08 NOTE — Patient Outreach (Signed)
  Care Coordination   Follow Up Visit Note   09/08/2022 Name: Kendrea Cerritos Cohoon MRN: [id number] DOB: [birth year]  Prentice Docker Giovanni is a 76 y.o. year old female who sees [name], NP for primary care. I  spoke with patients caregiver [name]   What matters to the patients health and wellness today?  Call returned to [name].   [name] stated that as of today he will no longer be coordinating care or caregiver for patient.     Goals Addressed             This Visit's Progress    Patient / caregiver stated:  Management of health conditions       Care Coordination Interventions: Collaborated with Chrystal Land LCSW regarding caregiver update             SDOH assessments and interventions completed:  No     Care Coordination Interventions Activated:  Yes  Care Coordination Interventions:  Yes, provided   Follow up plan:  as previously scheduled    Encounter Outcome:  Pt. Visit Completed   Quinn Plowman RN,BSN,CCM [place] [phone] direct line

## 2022-09-09 ENCOUNTER — Other Ambulatory Visit: Payer: Self-pay

## 2022-09-09 ENCOUNTER — Encounter: Payer: Self-pay | Admitting: Family

## 2022-09-09 ENCOUNTER — Inpatient Hospital Stay: Payer: Medicare Other

## 2022-09-09 ENCOUNTER — Ambulatory Visit: Payer: Medicare Other | Attending: Family | Admitting: Family

## 2022-09-09 VITALS — BP 114/66 | HR 79 | Resp 18 | Wt 164.0 lb

## 2022-09-09 DIAGNOSIS — C8300 Small cell B-cell lymphoma, unspecified site: Secondary | ICD-10-CM | POA: Insufficient documentation

## 2022-09-09 DIAGNOSIS — C8309 Small cell B-cell lymphoma, extranodal and solid organ sites: Secondary | ICD-10-CM | POA: Diagnosis not present

## 2022-09-09 DIAGNOSIS — D61818 Other pancytopenia: Secondary | ICD-10-CM | POA: Diagnosis not present

## 2022-09-09 DIAGNOSIS — Z79624 Long term (current) use of inhibitors of nucleotide synthesis: Secondary | ICD-10-CM | POA: Diagnosis not present

## 2022-09-09 DIAGNOSIS — J441 Chronic obstructive pulmonary disease with (acute) exacerbation: Secondary | ICD-10-CM | POA: Diagnosis not present

## 2022-09-09 DIAGNOSIS — Z5941 Food insecurity: Secondary | ICD-10-CM | POA: Diagnosis not present

## 2022-09-09 DIAGNOSIS — I959 Hypotension, unspecified: Secondary | ICD-10-CM | POA: Diagnosis not present

## 2022-09-09 DIAGNOSIS — I5032 Chronic diastolic (congestive) heart failure: Secondary | ICD-10-CM

## 2022-09-09 DIAGNOSIS — Z8249 Family history of ischemic heart disease and other diseases of the circulatory system: Secondary | ICD-10-CM | POA: Diagnosis not present

## 2022-09-09 DIAGNOSIS — E119 Type 2 diabetes mellitus without complications: Secondary | ICD-10-CM | POA: Insufficient documentation

## 2022-09-09 DIAGNOSIS — J449 Chronic obstructive pulmonary disease, unspecified: Secondary | ICD-10-CM | POA: Diagnosis not present

## 2022-09-09 DIAGNOSIS — J948 Other specified pleural conditions: Secondary | ICD-10-CM | POA: Diagnosis not present

## 2022-09-09 DIAGNOSIS — C8590 Non-Hodgkin lymphoma, unspecified, unspecified site: Secondary | ICD-10-CM

## 2022-09-09 DIAGNOSIS — Z87891 Personal history of nicotine dependence: Secondary | ICD-10-CM | POA: Diagnosis not present

## 2022-09-09 DIAGNOSIS — C8580 Other specified types of non-Hodgkin lymphoma, unspecified site: Secondary | ICD-10-CM

## 2022-09-09 DIAGNOSIS — J90 Pleural effusion, not elsewhere classified: Secondary | ICD-10-CM | POA: Diagnosis not present

## 2022-09-09 DIAGNOSIS — Z803 Family history of malignant neoplasm of breast: Secondary | ICD-10-CM | POA: Diagnosis not present

## 2022-09-09 DIAGNOSIS — Z801 Family history of malignant neoplasm of trachea, bronchus and lung: Secondary | ICD-10-CM | POA: Diagnosis not present

## 2022-09-09 DIAGNOSIS — R161 Splenomegaly, not elsewhere classified: Secondary | ICD-10-CM | POA: Diagnosis not present

## 2022-09-09 DIAGNOSIS — R590 Localized enlarged lymph nodes: Secondary | ICD-10-CM | POA: Diagnosis not present

## 2022-09-09 MED ORDER — FUROSEMIDE 20 MG PO TABS: 20.0000 mg | ORAL_TABLET | Freq: Every day | ORAL | 3 refills | Status: DC

## 2022-09-09 MED ORDER — SODIUM CHLORIDE 0.9% IV SOLUTION
250.0000 mL | Freq: Once | INTRAVENOUS | Status: AC
  Administered 2022-09-09: 250 mL via INTRAVENOUS
  Filled 2022-09-09: qty 250

## 2022-09-09 MED ORDER — ACETAMINOPHEN 325 MG PO TABS
650.0000 mg | ORAL_TABLET | Freq: Once | ORAL | Status: AC
  Administered 2022-09-09: 650 mg via ORAL
  Filled 2022-09-09: qty 2

## 2022-09-09 MED ORDER — HEPARIN SOD (PORK) LOCK FLUSH 100 UNIT/ML IV SOLN
INTRAVENOUS | Status: AC
  Administered 2022-09-09: 500 [IU]
  Filled 2022-09-09: qty 5

## 2022-09-09 NOTE — Progress Notes (Signed)
Patient ID: Natasha [name], female    DOB: 1946/01/11, 76 y.o.   MRN: [id number]  HPI  Natasha [name] is [name] 76 y/o female with [name] history of gastric lymphoma, DM, anemia, COPD, hypotension, pleural effusion, tobacco use and chronic heart failure.   Echo report from 07/13/22 reviewed and showed an EF of 60-65% along with mildly elevated PA pressure, mild MR and mild/moderate TR.   Admitted 08/29/22 due to SOB due to COPD/ HF exacerbation. Thoracentesis done. Given IV lasix. Oncology consult obtained. Discharged after 2 days. Was in the ED 08/23/22 and had an admission on 08/16/22 due to COPD exacerbation. Discharged the following day. Admitted 08/02/22 due to worsening shortness of breath. Recently treated for covid with paxlovid. Thoracentesis done with removal of 1L. Initially had increased oxygen requirement but then weaned back to baseline of 3L. Given 1 unit PRBC's. Lasix held due to AKI. Discharged after 2 days.   She presents today for an acute visit with [name] chief complaint of swelling in her lower legs. Describes this as worsening over the last several days. She has associated fatigue, cough, shortness of breath, abdominal distention and weight gain along with this. She denies any difficulty sleeping, palpitations, chest pain, wheezing or dizziness.   Reports having quite [name] bit of urinary incontinence so is currently not taking [name] diuretic.   Currently wearing oxygen at 4L around the clock although takes it off at home when going to the bathroom. 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   CHF (congestive heart failure) ([facility])    COPD (chronic obstructive pulmonary disease) ([facility])    Diabetes mellitus without complication ([facility])    Hypotension    Pleural effusion    [facility] 869m,  2 weeks ago   Vaginal delivery    x 5   Past Surgical History:  Procedure Laterality  Date   IR IMAGING GUIDED PORT INSERTION  08/16/2022   TONSILLECTOMY Bilateral    as [name] child   Family History  Problem Relation Age of Onset   Breast cancer Mother    Alzheimer's disease Father    Lung cancer Father        lung   Varicose Veins Brother    Heart attack Brother    Social History   Tobacco Use   Smoking status: Former    Packs/day: 2.00    Years: 55.00    Total pack years: 110.00    Types: Cigarettes    Start date: 07/07/1961    Quit date: 08/02/2022    Years since quitting: 0.1   Smokeless tobacco: Never   Tobacco comments:    1/2 pack she states but family says 2 PPD  Substance Use Topics   Alcohol use: No   No Known Allergies  Prior to Admission medications   Medication Sig Start Date End Date Taking? Authorizing Provider  acalabrutinib maleate (CALQUENCE) 100 MG tablet Take 1 tablet (100 mg total) by mouth 2 (two) times daily. 08/30/22  Yes Natasha Guadeloupe Natasha [name]  acyclovir (ZOVIRAX) 400 MG tablet Take 400 mg by mouth daily. 08/09/22  Yes Provider, Historical, Natasha [name]  albuterol (VENTOLIN HFA) 108 (90 Base) MCG/ACT inhaler TAKE 2 PUFFS BY MOUTH EVERY 6 HOURS AS NEEDED FOR WHEEZE OR SHORTNESS OF BREATH 06/17/22  Yes SPriscella Mann [employer]  [name], Natasha [name]  clobetasol cream (TEMOVATE) 4.09 % Apply 1 Application topically daily. 08/25/22  Yes Provider, Historical, Natasha [name]  furosemide (LASIX) 20 MG tablet Take 1 tablet (20 mg total) by mouth daily. 09/09/22  Yes Natasha [name], Natasha Kluver A, FNP  insulin NPH-regular Human (70-30) 100 UNIT/ML injection Inject 5 Units into the skin 2 (two) times daily with [name] meal. Use only for hyperglycemia, CBG+ 300. Start with 5 units, titrate up to max 10 unit per dose if need. Patient taking differently: Inject 10 Units into the skin 2 (two) times daily with [name] meal. Use only for hyperglycemia, CBG+ 300. Start with 5 units, titrate up to max 10 unit per dose if need. 08/17/22  Yes Natasha Mandes, Natasha [name]  insulin regular (NOVOLIN R) 100 units/mL injection Inject 5-10 Units into the  skin 3 (three) times daily before meals. BG 200-299 give 5 units BG 300-399 give 10 units.   Yes Provider, Historical, Natasha [name]  Ipratropium-Albuterol (COMBIVENT RESPIMAT) 20-100 MCG/ACT AERS respimat Inhale 1 puff into the lungs 3 (three) times daily.   Yes Provider, Historical, Natasha [name]  levothyroxine (SYNTHROID) 50 MCG tablet Take 1 tablet (50 mcg total) by mouth daily at 6 (six) AM. 07/17/22  Yes Natasha Hones, Natasha [name]  lidocaine-prilocaine (EMLA) cream Apply to affected area once Patient taking differently: 1 Application. Apply to mediport site 2 hours before infusion 07/28/22  Yes Natasha [place], Natasha [name]  loperamide (IMODIUM) 2 MG capsule Take 2 mg by mouth as needed for diarrhea or loose stools.   Yes Provider, Historical, Natasha [name]  nicotine (NICODERM CQ - DOSED IN MG/24 HOURS) 21 mg/24hr patch Place 21 mg onto the skin daily.   Yes Provider, Historical, Natasha [name]  ondansetron (ZOFRAN) 8 MG tablet Take 8 mg by mouth every 8 (eight) hours as needed for nausea, vomiting or refractory nausea / vomiting. Take 1 tablet q6H for 4 days after chemotherapy. 08/09/22  Yes Provider, Historical, Natasha [name]  prochlorperazine (COMPAZINE) 10 MG tablet Take 1 tablet by mouth every 6 (six) hours as needed for nausea or vomiting. Take 1 tab q6H for 4 days after chemotherapy. 08/09/22  Yes Provider, Historical, Natasha [name]  simethicone (MYLICON) 80 MG chewable tablet Chew 180 mg by mouth every 6 (six) hours as needed for flatulence. Take 1 capsule PO PRN gas relief.   Yes Provider, Historical, Natasha [name]  Natasha (NORCO) 5-325 MG tablet Take 1 tablet by mouth every 8 (eight) hours as needed for moderate pain. Patient not taking: Reported on 09/07/2022 08/17/22   Natasha Mandes, Natasha [name]    Review of Systems  Constitutional:  Positive for fatigue (minimal). Negative for appetite change.  HENT:  Negative for congestion, postnasal drip and sore throat.   Eyes: Negative.   Respiratory:  Positive for cough and shortness of breath (easily). Negative for chest  tightness and wheezing.   Cardiovascular:  Positive for leg swelling. Negative for chest pain and palpitations.  Gastrointestinal:  Positive for abdominal distention. Negative for abdominal pain.  Endocrine: Negative.   Genitourinary: Negative.   Musculoskeletal:  Negative for back pain and neck pain.  Skin: Negative.   Allergic/Immunologic: Negative.   Neurological:  Negative for dizziness and light-headedness.  Hematological:  Negative for adenopathy. Does not bruise/bleed easily.  Psychiatric/Behavioral:  Negative for dysphoric mood and sleep disturbance (sleeping on 2 pillows). The patient is not nervous/anxious.    Vitals:   09/09/22 1243  BP: 114/66  Pulse: 79  Resp: 18  SpO2: 93%  Weight: 164 lb (74.4 kg)   Wt Readings from Last  3 Encounters:  09/09/22 164 lb (74.4 kg)  08/31/22 157 lb 6.5 oz (71.4 kg)  08/23/22 140 lb (63.5 kg)   Lab Results  Component Value Date   CREATININE 0.75 09/07/2022   CREATININE 0.68 08/29/2022   CREATININE 0.81 08/16/2022   Physical Exam Vitals and nursing note reviewed. Exam conducted with [name] chaperone present (caregiver).  Constitutional:      Appearance: Normal appearance.  HENT:     Head: Normocephalic and atraumatic.  Cardiovascular:     Rate and Rhythm: Normal rate and regular rhythm.  Pulmonary:     Effort: Pulmonary effort is normal. No respiratory distress.     Breath sounds: No wheezing or rales.  Abdominal:     General: There is distension.     Palpations: Abdomen is soft.     Tenderness: There is no abdominal tenderness.  Musculoskeletal:        General: No tenderness.     Cervical back: Normal range of motion and neck supple.     Right lower leg: Edema (2+ pitting) present.     Left lower leg: Edema (2+ pitting) present.  Skin:    General: Skin is warm and dry.  Neurological:     Mental Status: She is alert and oriented to person, place, and time. Mental status is at baseline.  Psychiatric:        Mood and Affect:  Mood normal.        Behavior: Behavior normal.        Thought Content: Thought content normal.   Assessment & Plan:  1: Chronic heart failure with preserved ejection fraction without structural changes- - NYHA class III - fluid overloaded today with weight gain and worsening edema - weighing daily; reminded to call for an overnight weight gain of > 2 pounds or [name] weekly weight gain of > 5 pounds - weight up 7 pounds from last visit here 3 weeks ago - will add furosemide '20mg'$  daily - check BMP next visit if not done elsewhere - not adding salt but unclear if eating high sodium foods - reviewed keeping daily sodium intake to 60 ounces and most of this should be water and decaf coffee - encouraged to elevate her legs when she's sitting for long periods of time; she said that she tends to sit with her legs down - BNP 08/29/22 was 222.3  2: Hypotension- - BP looks good (114/66) - saw PCP Natasha [name]) 07/23/22; returns tomorrow - BMP 09/07/22 reviewed and showed sodium 138, potassium 4.2, creatinine 0.75 and GFR >60  3: COPD- - wearing oxygen at 4L around the clock - has not smoked since recent admission - continued cessation discussed  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

## 2022-09-09 NOTE — Patient Instructions (Signed)
Blood Transfusion, Adult A blood transfusion is a procedure in which you receive blood through an IV tube. You may need this procedure because of: A bleeding disorder. An illness. An injury. A surgery. The blood may come from someone else (a donor). You may also be able to donate blood for yourself before a surgery. The blood given in a transfusion may be made up of different types of cells. You may get: Red blood cells. These carry oxygen to the cells in the body. Platelets. These help your blood to clot. Plasma. This is the liquid part of your blood. It carries proteins and other substances through the body. White blood cells. These help you fight infections. If you have a clotting disorder, you may also get other types of blood products. Depending on the type of blood product, this procedure may take 1-4 hours to complete. Tell your doctor about: Any bleeding problems you have. Any reactions you have had during a blood transfusion in the past. Any allergies you have. All medicines you are taking, including vitamins, herbs, eye drops, creams, and over-the-counter medicines. Any surgeries you have had. Any medical conditions you have. Whether you are pregnant or may be pregnant. What are the risks? Talk with your health care provider about risks. The most common problems include: A mild allergic reaction. This includes red, swollen areas of skin (hives) and itching. Fever or chills. This may be the body's response to new blood cells received. This may happen during or up to 4 hours after the transfusion. More serious problems may include: A serious allergic reaction. This includes breathing trouble or swelling around the face and lips. Too much fluid in the lungs. This may cause breathing problems. Lung injury. This causes breathing trouble and low oxygen in the blood. This can happen within hours of the transfusion or days later. Too much iron. This can happen after getting many blood  transfusions over a period of time. An infection or virus passed through the blood. This is rare. Donated blood is carefully tested before it is given. Your body's defense system (immune system) trying to attack the new blood cells. This is rare. Symptoms may include fever, chills, nausea, low blood pressure, and low back or chest pain. Donated cells attacking healthy tissues. This is rare. What happens before the procedure? You will have a blood test to find out your blood type. The test also finds out what type of blood your body will accept and matches it to the donor type. If you are going to have a planned surgery, you may be able to donate your own blood. This may be done in case you need a transfusion. You will have your temperature, blood pressure, and pulse checked. You may receive medicine to help prevent an allergic reaction. This may be done if you have had a reaction to a transfusion before. This medicine may be given to you by mouth or through an IV tube. What happens during the procedure?  An IV tube will be put into one of your veins. The bag of blood will be attached to your IV tube. Then, the blood will enter through your vein. Your temperature, blood pressure, and pulse will be checked often. This is done to find early signs of a transfusion reaction. Tell your nurse right away if you have any of these symptoms: Shortness of breath or trouble breathing. Chest or back pain. Fever or chills. Red, swollen areas of skin or itching. If you have any signs   or symptoms of a reaction, your transfusion will be stopped. You may also be given medicine. When the transfusion is finished, your IV tube will be taken out. Pressure may be put on the IV site for a few minutes. A bandage (dressing) will be put on the IV site. The procedure may vary among doctors and hospitals. What happens after the procedure? You will be monitored until you leave the hospital or clinic. This includes  checking your temperature, blood pressure, pulse, breathing rate, and blood oxygen level. Your blood may be tested to see how you have responded to the transfusion. You may be warmed with fluids or blankets. This is done to keep the temperature of your body normal. If you have your procedure in an outpatient setting, you will be told whom to contact to report any reactions. Where to find more information Visit the [employer]: redcross.org Summary A blood transfusion is a procedure in which you receive blood through an IV tube. The blood you are given may be made up of different blood cells. You may receive red blood cells, platelets, plasma, or white blood cells. Your temperature, blood pressure, and pulse will be checked often. After the procedure, your blood may be tested to see how you have responded. This information is not intended to replace advice given to you by your health care provider. Make sure you discuss any questions you have with your health care provider. Document Revised: 01/08/2022 Document Reviewed: 01/08/2022 Elsevier Patient Education  2023 [employer]  

## 2022-09-09 NOTE — Patient Instructions (Addendum)
Continue weighing daily and call for an overnight weight gain of 3 pounds or more or a weekly weight gain of more than 5 pounds.   If you have voicemail, please make sure your mailbox is cleaned out so that we may leave a message and please make sure to listen to any voicemails.     Elevate your legs when sitting for longer periods    Start taking lasix as 1 tablet every morning.

## 2022-09-10 ENCOUNTER — Ambulatory Visit (INDEPENDENT_AMBULATORY_CARE_PROVIDER_SITE_OTHER): Payer: Medicare Other | Admitting: Nurse Practitioner

## 2022-09-10 ENCOUNTER — Encounter: Payer: Self-pay | Admitting: Licensed Clinical Social Worker

## 2022-09-10 ENCOUNTER — Encounter: Payer: Self-pay | Admitting: Nurse Practitioner

## 2022-09-10 VITALS — BP 125/68 | HR 77 | Temp 98.0°F | Resp 18 | Wt 162.9 lb

## 2022-09-10 DIAGNOSIS — J441 Chronic obstructive pulmonary disease with (acute) exacerbation: Secondary | ICD-10-CM | POA: Diagnosis not present

## 2022-09-10 DIAGNOSIS — E119 Type 2 diabetes mellitus without complications: Secondary | ICD-10-CM | POA: Diagnosis not present

## 2022-09-10 DIAGNOSIS — D696 Thrombocytopenia, unspecified: Secondary | ICD-10-CM | POA: Diagnosis not present

## 2022-09-10 DIAGNOSIS — Z7952 Long term (current) use of systemic steroids: Secondary | ICD-10-CM | POA: Diagnosis not present

## 2022-09-10 DIAGNOSIS — E039 Hypothyroidism, unspecified: Secondary | ICD-10-CM | POA: Diagnosis not present

## 2022-09-10 DIAGNOSIS — C8300 Small cell B-cell lymphoma, unspecified site: Secondary | ICD-10-CM | POA: Diagnosis not present

## 2022-09-10 DIAGNOSIS — Z9981 Dependence on supplemental oxygen: Secondary | ICD-10-CM | POA: Diagnosis not present

## 2022-09-10 DIAGNOSIS — D630 Anemia in neoplastic disease: Secondary | ICD-10-CM | POA: Diagnosis not present

## 2022-09-10 DIAGNOSIS — J9811 Atelectasis: Secondary | ICD-10-CM | POA: Diagnosis not present

## 2022-09-10 DIAGNOSIS — J9621 Acute and chronic respiratory failure with hypoxia: Secondary | ICD-10-CM

## 2022-09-10 DIAGNOSIS — Z9181 History of falling: Secondary | ICD-10-CM | POA: Diagnosis not present

## 2022-09-10 DIAGNOSIS — Z794 Long term (current) use of insulin: Secondary | ICD-10-CM | POA: Diagnosis not present

## 2022-09-10 DIAGNOSIS — Z87891 Personal history of nicotine dependence: Secondary | ICD-10-CM | POA: Diagnosis not present

## 2022-09-10 DIAGNOSIS — I5033 Acute on chronic diastolic (congestive) heart failure: Secondary | ICD-10-CM | POA: Diagnosis not present

## 2022-09-10 DIAGNOSIS — I493 Ventricular premature depolarization: Secondary | ICD-10-CM | POA: Diagnosis not present

## 2022-09-10 DIAGNOSIS — C9590 Leukemia, unspecified not having achieved remission: Secondary | ICD-10-CM | POA: Diagnosis not present

## 2022-09-10 DIAGNOSIS — C8590 Non-Hodgkin lymphoma, unspecified, unspecified site: Secondary | ICD-10-CM

## 2022-09-10 DIAGNOSIS — A419 Sepsis, unspecified organism: Secondary | ICD-10-CM | POA: Diagnosis not present

## 2022-09-10 LAB — TYPE AND SCREEN
ABO/RH(D): O POS
Antibody Screen: NEGATIVE
Unit division: 0

## 2022-09-10 LAB — BPAM RBC
Blood Product Expiration Date: 202311272359
ISSUE DATE / TIME: 202311160917
Unit Type and Rh: 9500

## 2022-09-10 NOTE — Progress Notes (Signed)
Established Patient Office Visit  Subjective:  Patient ID: Natasha [name], female    DOB: 10/24/1946  Age: 76 y.o. MRN: [id number]  CC:  Chief Complaint  Patient presents with   Hospitalization Follow-up    COPD exacerbation, doing good. Patient needs bedside commode with fixed arms-please discuss in note.     HPI  Natasha [name] presents for hospital follow up. She was hospitalized on 08/29/2022 due to shortness of breath with acute on chronic respiratory failure with hypoxia.  Patient states that he is doing fine. She had headache yesterday night, but no headches at present.She is on 4 L nasal cannula  Patient need bedside commode due to gait issues and also due to off-and-on diarrhea while on therapy.   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   CHF (congestive heart failure) (HCC)    COPD (chronic obstructive pulmonary disease) ([place])    Diabetes mellitus without complication (HCC)    Hypotension    Pleural effusion    [facility] 834m,  2 weeks ago   Vaginal delivery    x 5    Past Surgical History:  Procedure Laterality Date   IR IMAGING GUIDED PORT INSERTION  08/16/2022   TONSILLECTOMY Bilateral    as a child    Family History  Problem Relation Age of Onset   Breast cancer Mother    Alzheimer's disease Father    Lung cancer Father        lung   Varicose Veins Brother    Heart attack Brother     Social History   Socioeconomic History   Marital status: Widowed    Spouse name: Not on file   Number of children: 2   Years of education: Not on file   Highest education level: 9th grade  Occupational History   Occupation: unemployed  Tobacco Use   Smoking status: Former    Packs/day: 2.00    Years: 55.00    Total pack years: 110.00    Types: Cigarettes    Start date: 07/07/1961    Quit date: 08/02/2022    Years since quitting: 0.1   Smokeless tobacco: Never   Tobacco comments:     1/2 pack she states but family says 2 PPD  Vaping Use   Vaping Use: Never used  Substance and Sexual Activity   Alcohol use: No   Drug use: Never   Sexual activity: Not Currently    Partners: Male    Birth control/protection: Post-menopausal  Other Topics Concern   Not on file  Social History Narrative   08/14/20   [place]  Living: to be near daughter   Work: retired      FPhysiological scientistchildren - JIT consultant(nearby) and son JEvelena Peat[place]in [place]  8 grandchildren, & 2 great-grandchildren.      Enjoys: stays at home      Exercise: walking to her daughter's store   Diet: eats fruit, air fried chicken      Safety   Seat belts: Yes    Guns: No   Safe in relationships: Yes    Social Determinants of Health   Financial Resource Strain: Medium Risk (08/24/2022)   Overall Financial Resource Strain (CARDIA)    Difficulty of Paying Living Expenses: Somewhat hard  Food Insecurity: No Food Insecurity (09/18/2022)   Hunger Vital Sign  Worried About Charity fundraiser in the Last Year: Never true    [place] in the Last Year: Never true  Recent Concern: [place] Present (08/30/2022)   Hunger Vital Sign    Worried About Running Out of Food in the Last Year: Sometimes true    Ran Out of Food in the Last Year: Sometimes true  Transportation Needs: No Transportation Needs (09/18/2022)   PRAPARE - Hydrologist (Medical): No    Lack of Transportation (Non-Medical): No  Recent Concern: Transportation Needs - Unmet Transportation Needs (08/30/2022)   PRAPARE - Hydrologist (Medical): Yes    Lack of Transportation (Non-Medical): No  Physical Activity: Inactive (08/24/2022)   Exercise Vital Sign    Days of Exercise per Week: 0 days    Minutes of Exercise per Session: 0 min  Stress: Stress Concern Present (08/24/2022)   [place]    Feeling  of Stress : To some extent  Social Connections: Socially Isolated (08/24/2022)   Social Connection and Isolation Panel [NHANES]    Frequency of Communication with Friends and Family: More than three times a week    Frequency of Social Gatherings with Friends and Family: More than three times a week    Attends Religious Services: Never    Marine scientist or Organizations: No    Attends Archivist Meetings: Never    Marital Status: Widowed  Intimate Partner Violence: Not At Risk (09/18/2022)   Humiliation, Afraid, Rape, and Kick questionnaire    Fear of Current or Ex-Partner: No    Emotionally Abused: No    Physically Abused: No    Sexually Abused: No     Outpatient Medications Prior to Visit  Medication Sig Dispense Refill   acalabrutinib maleate (CALQUENCE) 100 MG tablet Take 1 tablet (100 mg total) by mouth 2 (two) times daily. 60 tablet 1   acyclovir (ZOVIRAX) 400 MG tablet Take 400 mg by mouth daily.     albuterol (VENTOLIN HFA) 108 (90 Base) MCG/ACT inhaler TAKE 2 PUFFS BY MOUTH EVERY 6 HOURS AS NEEDED FOR WHEEZE OR SHORTNESS OF BREATH 18 g 1   clobetasol cream (TEMOVATE) 3.64 % Apply 1 Application topically daily.     furosemide (LASIX) 20 MG tablet Take 1 tablet (20 mg total) by mouth daily. 90 tablet 3   levothyroxine (SYNTHROID) 50 MCG tablet Take 1 tablet (50 mcg total) by mouth daily at 6 (six) AM. 30 tablet 0   lidocaine-prilocaine (EMLA) cream Apply to affected area once (Patient taking differently: 1 Application. Apply to mediport site 2 hours before infusion) 30 g 3   loperamide (IMODIUM) 2 MG capsule Take 2 mg by mouth as needed for diarrhea or loose stools.     nicotine (NICODERM CQ - DOSED IN MG/24 HOURS) 21 mg/24hr patch Place 21 mg onto the skin daily. (Patient not taking: Reported on 10/01/2022)     ondansetron (ZOFRAN) 8 MG tablet Take 8 mg by mouth every 8 (eight) hours as needed for nausea, vomiting or refractory nausea / vomiting. Take 1 tablet q6H  for 4 days after chemotherapy.     prochlorperazine (COMPAZINE) 10 MG tablet Take 1 tablet by mouth every 6 (six) hours as needed for nausea or vomiting. Take 1 tab q6H for 4 days after chemotherapy.     simethicone (MYLICON) 80 MG chewable tablet Chew 180  mg by mouth every 6 (six) hours as needed for flatulence. Take 1 capsule PO PRN gas relief.     HYDROcodone-acetaminophen (NORCO) 5-325 MG tablet Take 1 tablet by mouth every 8 (eight) hours as needed for moderate pain. 15 tablet 0   insulin NPH-regular Human (70-30) 100 UNIT/ML injection Inject 5 Units into the skin 2 (two) times daily with a meal. Use only for hyperglycemia, CBG+ 300. Start with 5 units, titrate up to max 10 unit per dose if need. (Patient taking differently: Inject 10 Units into the skin 2 (two) times daily with a meal.) 10 mL 0   insulin regular (NOVOLIN R) 100 units/mL injection Inject 5-10 Units into the skin 3 (three) times daily before meals. BG 200-299 give 5 units BG 300-399 give 10 units.     Ipratropium-Albuterol (COMBIVENT RESPIMAT) 20-100 MCG/ACT AERS respimat Inhale 1 puff into the lungs 3 (three) times daily.     No facility-administered medications prior to visit.    No Known Allergies  ROS Review of Systems  Constitutional: Negative.   HENT: Negative.    Respiratory:  Negative for chest tightness and shortness of breath.   Cardiovascular:  Positive for leg swelling.  Gastrointestinal: Negative.   Genitourinary: Negative.   Musculoskeletal:  Negative for back pain.  Neurological: Negative.   Psychiatric/Behavioral: Negative.        Objective:    Physical Exam Constitutional:      Appearance: Normal appearance. She is obese.  HENT:     Head: Normocephalic.     Nose: Nose normal.     Mouth/Throat:     Mouth: Mucous membranes are moist.     Pharynx: Oropharynx is clear.  Eyes:     Extraocular Movements: Extraocular movements intact.     Conjunctiva/sclera: Conjunctivae normal.     Pupils:  Pupils are equal, round, and reactive to light.  Cardiovascular:     Rate and Rhythm: Normal rate and regular rhythm.     Pulses: Normal pulses.     Heart sounds: Normal heart sounds.  Pulmonary:     Effort: Pulmonary effort is normal. No respiratory distress.     Breath sounds: Normal breath sounds. No rhonchi.  Abdominal:     General: Bowel sounds are normal.     Palpations: Abdomen is soft. There is no mass.     Tenderness: There is no abdominal tenderness.     Hernia: No hernia is present.  Musculoskeletal:     Cervical back: Neck supple. No tenderness.     Right lower leg: Edema present.     Left lower leg: Edema present.  Skin:    General: Skin is warm.     Capillary Refill: Capillary refill takes less than 2 seconds.  Neurological:     General: No focal deficit present.     Mental Status: She is alert and oriented to person, place, and time. Mental status is at baseline.  Psychiatric:        Mood and Affect: Mood normal.        Behavior: Behavior normal.        Thought Content: Thought content normal.        Judgment: Judgment normal.     BP 125/68   Pulse 77   Temp 98 F (36.7 C) (Temporal)   Resp 18   Wt 162 lb 14.4 oz (73.9 kg)   SpO2 99%   BMI 27.11 kg/m  Wt Readings from Last 3 Encounters:  10/01/22 159 lb (  72.1 kg)  09/24/22 151 lb 14.4 oz (68.9 kg)  09/10/22 162 lb 14.4 oz (73.9 kg)     Health Maintenance  Topic Date Due   COVID-19 Vaccine (1) Never done   COLONOSCOPY (Pts 45-61yr Insurance coverage will need to be confirmed)  Never done   DEXA SCAN  Never done   MAMMOGRAM  09/20/2020   OPHTHALMOLOGY EXAM  01/20/2021   FOOT EXAM  08/21/2022   Zoster Vaccines- Shingrix (1 of 2) 10/22/2022 (Originally 12/31/1964)   HEMOGLOBIN A1C  12/18/2022   DTaP/Tdap/Td (2 - Td or Tdap) 07/07/2031   Pneumonia Vaccine 76 Years old  Completed   INFLUENZA VACCINE  Completed   Hepatitis C Screening  Completed   HPV VACCINES  Aged Out   Lung Cancer Screening   Discontinued    There are no preventive care reminders to display for this patient.  Lab Results  Component Value Date   TSH 6.064 (H) 07/28/2022   Lab Results  Component Value Date   WBC 6.1 10/01/2022   HGB 7.9 (L) 10/01/2022   HCT 25.9 (L) 10/01/2022   MCV 94.9 10/01/2022   PLT 74 (L) 10/01/2022   Lab Results  Component Value Date   NA 141 10/01/2022   K 4.2 10/01/2022   CO2 28 10/01/2022   GLUCOSE 144 (H) 10/01/2022   BUN 37 (H) 10/01/2022   CREATININE 0.79 10/01/2022   BILITOT 1.4 (H) 10/01/2022   ALKPHOS 170 (H) 10/01/2022   AST 27 10/01/2022   ALT 14 10/01/2022   PROT 5.1 (L) 10/01/2022   ALBUMIN 2.8 (L) 10/01/2022   CALCIUM 8.2 (L) 10/01/2022   ANIONGAP 6 10/01/2022   EGFR 61 06/22/2022   Lab Results  Component Value Date   CHOL 97 (L) 04/09/2022   Lab Results  Component Value Date   HDL 14 (L) 04/09/2022   Lab Results  Component Value Date   LDLCALC 61 04/09/2022   Lab Results  Component Value Date   TRIG 119 04/09/2022   Lab Results  Component Value Date   CHOLHDL 4.8 07/07/2018   Lab Results  Component Value Date   HGBA1C 5.5 06/17/2022      Assessment & Plan:   Problem List Items Addressed This Visit       Respiratory   Acute on chronic respiratory failure with hypoxia ([place] - Primary    Advised patient to continue use of bronchodilators and supplemental oxygen. She is followed by pulmonologist, cardiologist and oncologist. Referral sent for bedside commode        No orders of the defined types were placed in this encounter.    Follow-up: No follow-ups on file.    CTheresia Lo NP
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
## 2022-09-10 NOTE — Progress Notes (Signed)
Patient needs bedside commode due to weakness and her having chronic diarrhea.

## 2022-09-10 NOTE — Progress Notes (Signed)
Patient is in need of a bedside commode with fixed arms. She is unable to move fast on her own as she has an unsteady gait and a history toxic gastroenteritis and colitis.

## 2022-09-11 ENCOUNTER — Other Ambulatory Visit: Payer: Self-pay

## 2022-09-11 ENCOUNTER — Emergency Department: Payer: Medicare Other

## 2022-09-11 ENCOUNTER — Emergency Department
Admission: EM | Admit: 2022-09-11 | Discharge: 2022-09-11 | Disposition: A | Discharge status: Home / Self Care | Payer: Medicare Other | Attending: Emergency Medicine | Admitting: Emergency Medicine

## 2022-09-11 ENCOUNTER — Telehealth: Payer: Self-pay | Admitting: Oncology

## 2022-09-11 DIAGNOSIS — I509 Heart failure, unspecified: Secondary | ICD-10-CM | POA: Diagnosis not present

## 2022-09-11 DIAGNOSIS — E1165 Type 2 diabetes mellitus with hyperglycemia: Secondary | ICD-10-CM | POA: Diagnosis not present

## 2022-09-11 DIAGNOSIS — R0689 Other abnormalities of breathing: Secondary | ICD-10-CM | POA: Diagnosis not present

## 2022-09-11 DIAGNOSIS — G4489 Other headache syndrome: Secondary | ICD-10-CM | POA: Diagnosis not present

## 2022-09-11 DIAGNOSIS — J449 Chronic obstructive pulmonary disease, unspecified: Secondary | ICD-10-CM | POA: Diagnosis not present

## 2022-09-11 DIAGNOSIS — R609 Edema, unspecified: Secondary | ICD-10-CM | POA: Diagnosis not present

## 2022-09-11 DIAGNOSIS — E119 Type 2 diabetes mellitus without complications: Secondary | ICD-10-CM | POA: Diagnosis not present

## 2022-09-11 DIAGNOSIS — R1010 Upper abdominal pain, unspecified: Secondary | ICD-10-CM | POA: Diagnosis not present

## 2022-09-11 DIAGNOSIS — R6889 Other general symptoms and signs: Secondary | ICD-10-CM | POA: Diagnosis not present

## 2022-09-11 DIAGNOSIS — Z743 Need for continuous supervision: Secondary | ICD-10-CM | POA: Diagnosis not present

## 2022-09-11 DIAGNOSIS — Z8572 Personal history of non-Hodgkin lymphomas: Secondary | ICD-10-CM | POA: Insufficient documentation

## 2022-09-11 DIAGNOSIS — C8590 Non-Hodgkin lymphoma, unspecified, unspecified site: Secondary | ICD-10-CM | POA: Diagnosis not present

## 2022-09-11 DIAGNOSIS — R519 Headache, unspecified: Secondary | ICD-10-CM | POA: Diagnosis not present

## 2022-09-11 DIAGNOSIS — G459 Transient cerebral ischemic attack, unspecified: Secondary | ICD-10-CM | POA: Diagnosis not present

## 2022-09-11 LAB — BASIC METABOLIC PANEL
Anion gap: 6 (ref 5–15)
BUN: 40 mg/dL — ABNORMAL HIGH (ref 8–23)
CO2: 27 mmol/L (ref 22–32)
Calcium: 8.2 mg/dL — ABNORMAL LOW (ref 8.9–10.3)
Chloride: 106 mmol/L (ref 98–111)
Creatinine, Ser: 0.79 mg/dL (ref 0.44–1.00)
GFR, Estimated: >60 mL/min (ref >60)
Glucose, Bld: 244 mg/dL — ABNORMAL HIGH (ref 70–99)
Potassium: 4.8 mmol/L (ref 3.5–5.1)
Sodium: 139 mmol/L (ref 135–145)

## 2022-09-11 LAB — CBC
HCT: 24.6 % — ABNORMAL LOW (ref 36.0–46.0)
Hemoglobin: 7.2 g/dL — ABNORMAL LOW (ref 12.0–15.0)
MCH: 26.5 pg (ref 26.0–34.0)
MCHC: 29.3 g/dL — ABNORMAL LOW (ref 30.0–36.0)
MCV: 90.4 fL (ref 80.0–100.0)
Platelets: 72 10*3/uL — ABNORMAL LOW (ref 150–400)
RBC: 2.72 MIL/uL — ABNORMAL LOW (ref 3.87–5.11)
RDW: 18.6 % — ABNORMAL HIGH (ref 11.5–15.5)
WBC: 6.2 10*3/uL (ref 4.0–10.5)
nRBC: 0.5 % — ABNORMAL HIGH (ref 0.0–0.2)

## 2022-09-11 MED ORDER — SODIUM CHLORIDE 0.9 % IV BOLUS
500.0000 mL | Freq: Once | INTRAVENOUS | Status: AC
  Administered 2022-09-11: 500 mL via INTRAVENOUS

## 2022-09-11 MED ORDER — ONDANSETRON HCL 4 MG/2ML IJ SOLN
4.0000 mg | Freq: Once | INTRAMUSCULAR | Status: AC
  Administered 2022-09-11: 4 mg via INTRAVENOUS
  Filled 2022-09-11: qty 2

## 2022-09-11 MED ORDER — MORPHINE SULFATE (PF) 4 MG/ML IV SOLN
4.0000 mg | Freq: Once | INTRAVENOUS | Status: AC
  Administered 2022-09-11: 4 mg via INTRAVENOUS
  Filled 2022-09-11: qty 1

## 2022-09-11 NOTE — ED Triage Notes (Signed)
Pt presents via EMS c/o headache since last night. Reports caregiver called 911. Reports did not take any meds for the headache. Reports currently undergoing chemo.

## 2022-09-11 NOTE — ED Notes (Signed)
[phone] caregiver called and spoke about EMS transport to home. [facility] does not have oxygen tanks. PT does not have tank with her. Caregiver will be waiting at her home address which was confirmed with him. Will request EMS transport.

## 2022-09-11 NOTE — ED Provider Notes (Signed)
[facility] Provider Note    Event Date/Time   First MD Initiated Contact with Patient 09/11/22 1136     (approximate)   History   Headache   HPI  Natasha [name] is a 76 y.o. female with history of diabetes, lymphoma, COPD, CHF presents emergency department complaining of a headache.  Headache started this morning.  Her niece called EMS because she was concerned about her having an acute headache and possible stroke.  Patient's had no LOC, weakness in extremities, slurred speech.  No fever or chills      Physical Exam   Triage Vital Signs: ED Triage Vitals [09/11/22 1115]  Enc Vitals Group     BP (!) 102/51     Pulse Rate 85     Resp 16     Temp 98.5 F (36.9 C)     Temp Source Oral     SpO2 100 %     Weight      Height      Head Circumference      Peak Flow      Pain Score 5     Pain Loc      Pain Edu?      Excl. in [place]?     Most recent vital signs: Vitals:   09/11/22 1115  BP: (!) 102/51  Pulse: 85  Resp: 16  Temp: 98.5 F (36.9 C)  SpO2: 100%     General: Awake, no distress.   CV:  Good peripheral perfusion. regular rate and  rhythm Resp:  Normal effort.  Abd:  No distention.   Other:  Patient is able to stand from the wheelchair and get onto the stretcher without help, cranial nerves II through XII grossly intact, neurovascular appears to be intact, grips are equal bilaterally   ED Results / Procedures / Treatments   Labs (all labs ordered are listed, but only abnormal results are displayed) Labs Reviewed  CBC - Abnormal; Notable for the following components:      Result Value   RBC 2.72 (*)    Hemoglobin 7.2 (*)    HCT 24.6 (*)    MCHC 29.3 (*)    RDW 18.6 (*)    Platelets 72 (*)    nRBC 0.5 (*)    All other components within normal limits  BASIC METABOLIC PANEL - Abnormal; Notable for the following components:   Glucose, Bld 244 (*)    BUN 40 (*)    Calcium 8.2 (*)    All other components within normal  limits     EKG     RADIOLOGY CT of the head for new onset of headache along with history of cancer    PROCEDURES:   Procedures   MEDICATIONS ORDERED IN ED: Medications  sodium chloride 0.9 % bolus 500 mL (500 mLs Intravenous New Bag/Given 09/11/22 1155)  ondansetron (ZOFRAN) injection 4 mg (4 mg Intravenous Given 09/11/22 1149)  morphine (PF) 4 MG/ML injection 4 mg (4 mg Intravenous Given 09/11/22 1155)     IMPRESSION / MDM / ASSESSMENT AND PLAN / ED COURSE  I reviewed the triage vital signs and the nursing notes.                              Differential diagnosis includes, but is not limited to, migraine, headache, CVA, SAH, brain tumor  Patient's presentation is most consistent with acute presentation with potential threat to  life or bodily function.   CT of the head Patient was given 500 mL of normal saline, morphine 4 mg IV and Zofran 4 mg IV, she was also placed on 4 L O2 which is what she normally uses at home.  She does not exhibit any strokelike symptoms at this time.   CT of the head independently reviewed and interpreted by me as being negative for any acute abnormality.  Patient's lab work is reassuring, it is in her normal trend  I did explain all the findings to the patient.  She feels a little better after the medication.  States she does not know why they made her come up.  She would just like to go home.  I feel that she is stable to go home and does not need admission.  We are trying to arrange a ride for the patient may have to go via EMS due to the oxygen requirements.  Patient does not have a tank with her.   FINAL CLINICAL IMPRESSION(S) / ED DIAGNOSES   Final diagnoses:  Bad headache     Rx / DC Orders   ED Discharge Orders     None        Note:  This document was prepared using Dragon voice recognition software and may include unintentional dictation errors.    Versie Starks, PA-C 09/11/22 1311    Lucillie Garfinkel,  MD 09/11/22 2124

## 2022-09-11 NOTE — ED Triage Notes (Signed)
Pt in via EMS from home with reports of  EMS reports caretaker concerned about CVA due to increased weakness on one side. EMS reports no weakness noted. Pt was taken off of chemo recently and changed to the pill.  Pt also reports HA since last pm that is intermittent and a hx of migraines. 128/57, HR 88, 96% on 4L. Pt is always on 4L of oxygen. EMS reports pt was not on oxygen when they arrived.  Hx of diabetes, CBG 275.Pt was medicated 30 minutes prior to EMS arrival. Pt has lymphoma and is now taking a chemo pill. Pt had chemo yesterday. Pt with port to right chest

## 2022-09-11 NOTE — Telephone Encounter (Signed)
[name]  Neiswonger called and reports patient has a headache, 6 out of 10.  He performed neurological examination. Patient has decreased strength of right upper and low extremities and this is a new finding. With the new onset of focal weakness and headache, I suggest patient to go to ER for further work up. [name] says he will relay message to patient.  Cc Dr. Janese Banks

## 2022-09-11 NOTE — ED Notes (Signed)
Pt to ED for intermittent frontotemporal HA on R side 5/10 since last night. Denies any numbness, motor deficits or vision changes. States has hx of same. Wears 4liters oxygen chronically. Turned to 3L per request. Satting 100%.

## 2022-09-13 ENCOUNTER — Encounter: Payer: Self-pay | Admitting: Oncology

## 2022-09-14 ENCOUNTER — Telehealth: Payer: Self-pay

## 2022-09-14 ENCOUNTER — Telehealth: Payer: Self-pay | Admitting: *Deleted

## 2022-09-14 ENCOUNTER — Other Ambulatory Visit: Payer: Self-pay | Admitting: Oncology

## 2022-09-14 ENCOUNTER — Inpatient Hospital Stay: Payer: Medicare Other

## 2022-09-14 DIAGNOSIS — E119 Type 2 diabetes mellitus without complications: Secondary | ICD-10-CM | POA: Diagnosis not present

## 2022-09-14 DIAGNOSIS — Z87891 Personal history of nicotine dependence: Secondary | ICD-10-CM | POA: Diagnosis not present

## 2022-09-14 DIAGNOSIS — J948 Other specified pleural conditions: Secondary | ICD-10-CM | POA: Diagnosis not present

## 2022-09-14 DIAGNOSIS — R609 Edema, unspecified: Secondary | ICD-10-CM | POA: Diagnosis not present

## 2022-09-14 DIAGNOSIS — D61818 Other pancytopenia: Secondary | ICD-10-CM | POA: Diagnosis not present

## 2022-09-14 DIAGNOSIS — J449 Chronic obstructive pulmonary disease, unspecified: Secondary | ICD-10-CM | POA: Diagnosis not present

## 2022-09-14 DIAGNOSIS — E1165 Type 2 diabetes mellitus with hyperglycemia: Secondary | ICD-10-CM | POA: Diagnosis not present

## 2022-09-14 DIAGNOSIS — R161 Splenomegaly, not elsewhere classified: Secondary | ICD-10-CM | POA: Diagnosis not present

## 2022-09-14 DIAGNOSIS — J90 Pleural effusion, not elsewhere classified: Secondary | ICD-10-CM | POA: Diagnosis not present

## 2022-09-14 DIAGNOSIS — C8580 Other specified types of non-Hodgkin lymphoma, unspecified site: Secondary | ICD-10-CM

## 2022-09-14 DIAGNOSIS — Z79624 Long term (current) use of inhibitors of nucleotide synthesis: Secondary | ICD-10-CM | POA: Diagnosis not present

## 2022-09-14 DIAGNOSIS — R1010 Upper abdominal pain, unspecified: Secondary | ICD-10-CM | POA: Diagnosis not present

## 2022-09-14 DIAGNOSIS — Z5941 Food insecurity: Secondary | ICD-10-CM | POA: Diagnosis not present

## 2022-09-14 DIAGNOSIS — C8309 Small cell B-cell lymphoma, extranodal and solid organ sites: Secondary | ICD-10-CM | POA: Diagnosis not present

## 2022-09-14 DIAGNOSIS — Z801 Family history of malignant neoplasm of trachea, bronchus and lung: Secondary | ICD-10-CM | POA: Diagnosis not present

## 2022-09-14 DIAGNOSIS — C8590 Non-Hodgkin lymphoma, unspecified, unspecified site: Secondary | ICD-10-CM

## 2022-09-14 DIAGNOSIS — Z803 Family history of malignant neoplasm of breast: Secondary | ICD-10-CM | POA: Diagnosis not present

## 2022-09-14 DIAGNOSIS — Z8249 Family history of ischemic heart disease and other diseases of the circulatory system: Secondary | ICD-10-CM | POA: Diagnosis not present

## 2022-09-14 DIAGNOSIS — R590 Localized enlarged lymph nodes: Secondary | ICD-10-CM | POA: Diagnosis not present

## 2022-09-14 DIAGNOSIS — D649 Anemia, unspecified: Secondary | ICD-10-CM

## 2022-09-14 LAB — COMPREHENSIVE METABOLIC PANEL
ALT: 15 U/L (ref 0–44)
AST: 22 U/L (ref 15–41)
Albumin: 3 g/dL — ABNORMAL LOW (ref 3.5–5.0)
Alkaline Phosphatase: 145 U/L — ABNORMAL HIGH (ref 38–126)
Anion gap: 3 — ABNORMAL LOW (ref 5–15)
BUN: 37 mg/dL — ABNORMAL HIGH (ref 8–23)
CO2: 30 mmol/L (ref 22–32)
Calcium: 8.2 mg/dL — ABNORMAL LOW (ref 8.9–10.3)
Chloride: 107 mmol/L (ref 98–111)
Creatinine, Ser: 0.82 mg/dL (ref 0.44–1.00)
GFR, Estimated: >60 mL/min (ref >60)
Glucose, Bld: 119 mg/dL — ABNORMAL HIGH (ref 70–99)
Potassium: 3.9 mmol/L (ref 3.5–5.1)
Sodium: 140 mmol/L (ref 135–145)
Total Bilirubin: 1.2 mg/dL (ref 0.3–1.2)
Total Protein: 5 g/dL — ABNORMAL LOW (ref 6.5–8.1)

## 2022-09-14 LAB — CBC WITH DIFFERENTIAL/PLATELET
Abs Immature Granulocytes: 0.1 10*3/uL — ABNORMAL HIGH (ref 0.00–0.07)
Basophils Absolute: 0.1 10*3/uL (ref 0.0–0.1)
Basophils Relative: 1 %
Eosinophils Absolute: 0.1 10*3/uL (ref 0.0–0.5)
Eosinophils Relative: 1 %
HCT: 20.5 % — ABNORMAL LOW (ref 36.0–46.0)
Hemoglobin: 5.9 g/dL — ABNORMAL LOW (ref 12.0–15.0)
Immature Granulocytes: 1 %
Lymphocytes Relative: 73 %
Lymphs Abs: 5.4 10*3/uL — ABNORMAL HIGH (ref 0.7–4.0)
MCH: 27.1 pg (ref 26.0–34.0)
MCHC: 28.8 g/dL — ABNORMAL LOW (ref 30.0–36.0)
MCV: 94 fL (ref 80.0–100.0)
Monocytes Absolute: 0.4 10*3/uL (ref 0.1–1.0)
Monocytes Relative: 6 %
Neutro Abs: 1.3 10*3/uL — ABNORMAL LOW (ref 1.7–7.7)
Neutrophils Relative %: 18 %
Platelets: 60 10*3/uL — ABNORMAL LOW (ref 150–400)
RBC: 2.18 MIL/uL — ABNORMAL LOW (ref 3.87–5.11)
RDW: 18.8 % — ABNORMAL HIGH (ref 11.5–15.5)
Smear Review: NORMAL
WBC: 7.5 10*3/uL (ref 4.0–10.5)
nRBC: 0.8 % — ABNORMAL HIGH (ref 0.0–0.2)

## 2022-09-14 LAB — PREPARE RBC (CROSSMATCH)

## 2022-09-14 LAB — SAMPLE TO BLOOD BANK

## 2022-09-14 LAB — HEPATITIS B CORE ANTIBODY, IGM: Hep B C IgM: NONREACTIVE

## 2022-09-14 NOTE — Telephone Encounter (Signed)
     Patient  visit on 11/18  at [place]   Have you been able to follow up with your primary care physician? Yes   The patient was or was not able to obtain any needed medicine or equipment. Yes   Are there diet recommendations that you are having difficulty following? Yes   Patient expresses understanding of discharge instructions and education provided has no other needs at this time.  Yes      Roy, [facility], Care Management  [phone] [address], [place], [place] [address] Phone: [phone] Email: [email or web address]

## 2022-09-14 NOTE — Telephone Encounter (Signed)
Called [name] and he call be back and said that he is worried about the pt. Being on [employer] transportation and her oxygen level dropped one day 91 % on 4 liters. So he called EMS for non emergent ride and it is at 10 am. And was told that she would be there in 15 min. I also called Allyson in chemo to let her know if that it is ok and she is good. I also told her that 45 min call in for the EMS to come back . She knows and she will be working on that.

## 2022-09-15 ENCOUNTER — Inpatient Hospital Stay: Payer: Medicare Other

## 2022-09-15 ENCOUNTER — Ambulatory Visit: Payer: Medicare Other

## 2022-09-15 DIAGNOSIS — Z803 Family history of malignant neoplasm of breast: Secondary | ICD-10-CM | POA: Diagnosis not present

## 2022-09-15 DIAGNOSIS — C8590 Non-Hodgkin lymphoma, unspecified, unspecified site: Secondary | ICD-10-CM | POA: Diagnosis not present

## 2022-09-15 DIAGNOSIS — D61818 Other pancytopenia: Secondary | ICD-10-CM | POA: Diagnosis not present

## 2022-09-15 DIAGNOSIS — E119 Type 2 diabetes mellitus without complications: Secondary | ICD-10-CM | POA: Diagnosis not present

## 2022-09-15 DIAGNOSIS — C8309 Small cell B-cell lymphoma, extranodal and solid organ sites: Secondary | ICD-10-CM | POA: Diagnosis not present

## 2022-09-15 DIAGNOSIS — D649 Anemia, unspecified: Secondary | ICD-10-CM

## 2022-09-15 DIAGNOSIS — R161 Splenomegaly, not elsewhere classified: Secondary | ICD-10-CM | POA: Diagnosis not present

## 2022-09-15 DIAGNOSIS — Z8249 Family history of ischemic heart disease and other diseases of the circulatory system: Secondary | ICD-10-CM | POA: Diagnosis not present

## 2022-09-15 DIAGNOSIS — Z743 Need for continuous supervision: Secondary | ICD-10-CM | POA: Diagnosis not present

## 2022-09-15 DIAGNOSIS — E1165 Type 2 diabetes mellitus with hyperglycemia: Secondary | ICD-10-CM | POA: Diagnosis not present

## 2022-09-15 DIAGNOSIS — J449 Chronic obstructive pulmonary disease, unspecified: Secondary | ICD-10-CM | POA: Diagnosis not present

## 2022-09-15 DIAGNOSIS — J948 Other specified pleural conditions: Secondary | ICD-10-CM | POA: Diagnosis not present

## 2022-09-15 DIAGNOSIS — J90 Pleural effusion, not elsewhere classified: Secondary | ICD-10-CM | POA: Diagnosis not present

## 2022-09-15 DIAGNOSIS — Z87891 Personal history of nicotine dependence: Secondary | ICD-10-CM | POA: Diagnosis not present

## 2022-09-15 DIAGNOSIS — Z79624 Long term (current) use of inhibitors of nucleotide synthesis: Secondary | ICD-10-CM | POA: Diagnosis not present

## 2022-09-15 DIAGNOSIS — R590 Localized enlarged lymph nodes: Secondary | ICD-10-CM | POA: Diagnosis not present

## 2022-09-15 DIAGNOSIS — R609 Edema, unspecified: Secondary | ICD-10-CM | POA: Diagnosis not present

## 2022-09-15 DIAGNOSIS — Z5941 Food insecurity: Secondary | ICD-10-CM | POA: Diagnosis not present

## 2022-09-15 DIAGNOSIS — Z801 Family history of malignant neoplasm of trachea, bronchus and lung: Secondary | ICD-10-CM | POA: Diagnosis not present

## 2022-09-15 DIAGNOSIS — R2681 Unsteadiness on feet: Secondary | ICD-10-CM | POA: Diagnosis not present

## 2022-09-15 DIAGNOSIS — R6889 Other general symptoms and signs: Secondary | ICD-10-CM | POA: Diagnosis not present

## 2022-09-15 MED ORDER — ACETAMINOPHEN 325 MG PO TABS
650.0000 mg | ORAL_TABLET | Freq: Once | ORAL | Status: AC
  Administered 2022-09-15: 650 mg via ORAL
  Filled 2022-09-15: qty 2

## 2022-09-15 MED ORDER — HEPARIN SOD (PORK) LOCK FLUSH 100 UNIT/ML IV SOLN
500.0000 [IU] | Freq: Every day | INTRAVENOUS | Status: AC | PRN
  Filled 2022-09-15: qty 5

## 2022-09-15 MED ORDER — HEPARIN SOD (PORK) LOCK FLUSH 100 UNIT/ML IV SOLN
INTRAVENOUS | Status: AC
  Administered 2022-09-15: 500 [IU]
  Filled 2022-09-15: qty 5

## 2022-09-15 MED ORDER — SODIUM CHLORIDE 0.9% IV SOLUTION
250.0000 mL | Freq: Once | INTRAVENOUS | Status: AC
  Administered 2022-09-15: 250 mL via INTRAVENOUS
  Filled 2022-09-15: qty 250

## 2022-09-15 NOTE — Patient Instructions (Signed)
Blood Transfusion, Adult A blood transfusion is a procedure in which you receive blood or a type of blood cell (blood component) through an IV. You may need a blood transfusion when you have a low blood count, which is a low number of any blood cell. This may result from a bleeding disorder, illness, injury, or surgery. The blood may come from a donor, or you may be able to have your own blood collected and stored (autologous blood donation) before a planned surgery. The blood given in a transfusion may be made up of different blood components. You may receive: Red blood cells. These carry oxygen to the cells in the body. Platelets. These help your blood to clot. Plasma. This is the liquid part of your blood. It carries proteins and other substances throughout the body. White blood cells. These help you fight infections. If you have hemophilia or another clotting disorder, you may also receive other types of blood products. Depending on the type of blood product, this procedure may take 1-4 hours to complete. Tell a health care provider about: Any bleeding problems you have. Any previous reactions you have had during a blood transfusion. Any allergies you have. All medicines you are taking, including vitamins, herbs, eye drops, creams, and over-the-counter medicines. Any surgeries you have had. Any medical conditions you have. Whether you are pregnant or may be pregnant. What are the risks? Talk with your health care provider about risks. The most common problems include: A mild allergic reaction, such as red, swollen areas of skin (hives) and itching. Fever or chills. This may be the body's response to new blood cells received. This may occur during or up to 4 hours after the transfusion. More serious problems may include: A serious allergic reaction that causes difficulty breathing or swelling around the face and lips. Transfusion-associated circulatory overload (TACO), or too much fluid in  the lungs. This may cause breathing problems. Transfusion-related acute lung injury (TRALI), which causes breathing difficulty and low oxygen in the blood. This can occur within hours of the transfusion or several days later. Iron overload. This can happen after receiving many blood transfusions over a period of time. Infection or virus being transmitted. This is rare because donated blood is carefully tested before it is given. Hemolytic transfusion reaction. This is rare. It happens when the body's defense system (immune system)tries to attack the new blood cells. Symptoms may include fever, chills, nausea, low blood pressure, and low back or chest pain. Transfusion-associated graft-versus-host disease (TAGVHD). This is rare. It happens when donated cells attack the body's healthy tissues. What happens before the procedure? You will have a blood test to check your blood type. This test is done to know what kind of blood your body will accept and to match it to the donor blood. If you are going to have a planned surgery, you may be able to do an autologous blood donation. This may be done in case you need to have a transfusion. You will have your temperature, blood pressure, and pulse checked before the transfusion. If you have had an allergic reaction to a transfusion in the past, you may be given medicine to help prevent a reaction. This medicine may be given to you by mouth (orally) or through an IV. What happens during the procedure?  An IV will be inserted into one of your veins. The bag of blood will be attached to your IV. The blood will then enter through your vein. Your temperature, blood pressure,   and pulse will be monitored during the transfusion. This monitoring is done to detect early signs of a transfusion reaction. Tell your nurse right away if you have any of these symptoms during the transfusion: Shortness of breath or trouble breathing. Chest or back pain. Fever or  chills. Itching or hives. If you have any signs or symptoms of a reaction, your transfusion will be stopped and you may be given medicine. When the transfusion is complete, your IV will be removed. Pressure may be applied to the IV site for a few minutes. A bandage (dressing)will be applied. The procedure may vary among health care providers and hospitals. What happens after the procedure? Your temperature, blood pressure, pulse, breathing rate, and blood oxygen level will be monitored until you leave the hospital or clinic. Your blood may be tested to see how you have responded to the transfusion. You may be warmed with fluids or blankets to maintain a normal body temperature. If you receive your blood transfusion in an outpatient setting, you will be told whom to contact to report any reactions. Where to find more information Visit the [employer]: redcross.org Summary A blood transfusion is a procedure in which you receive blood or a type of blood cell (blood component) through an IV. The blood given in a transfusion may be made up of different blood components. You may receive red blood cells, platelets, plasma, or white blood cells depending on the condition treated. Your temperature, blood pressure, and pulse will be monitored before, during, and after the transfusion. After the transfusion, your blood may be tested to see how your body has responded. This information is not intended to replace advice given to you by your health care provider. Make sure you discuss any questions you have with your health care provider. Document Revised: 01/08/2022 Document Reviewed: 01/08/2022 Elsevier Patient Education  2023 [employer] Inc.  

## 2022-09-16 LAB — BPAM RBC
Blood Product Expiration Date: 202312182359
Blood Product Expiration Date: 202312182359
ISSUE DATE / TIME: 202311221104
ISSUE DATE / TIME: 202311221317
Unit Type and Rh: 5100
Unit Type and Rh: 5100

## 2022-09-16 LAB — TYPE AND SCREEN
ABO/RH(D): O POS
Antibody Screen: NEGATIVE
Unit division: 0
Unit division: 0

## 2022-09-17 ENCOUNTER — Emergency Department: Payer: Medicare Other

## 2022-09-17 ENCOUNTER — Other Ambulatory Visit: Payer: Self-pay

## 2022-09-17 DIAGNOSIS — D709 Neutropenia, unspecified: Secondary | ICD-10-CM | POA: Diagnosis present

## 2022-09-17 DIAGNOSIS — C8513 Unspecified B-cell lymphoma, intra-abdominal lymph nodes: Secondary | ICD-10-CM | POA: Diagnosis not present

## 2022-09-17 DIAGNOSIS — C8599 Non-Hodgkin lymphoma, unspecified, extranodal and solid organ sites: Secondary | ICD-10-CM | POA: Diagnosis not present

## 2022-09-17 DIAGNOSIS — Z8249 Family history of ischemic heart disease and other diseases of the circulatory system: Secondary | ICD-10-CM | POA: Diagnosis not present

## 2022-09-17 DIAGNOSIS — I5033 Acute on chronic diastolic (congestive) heart failure: Secondary | ICD-10-CM | POA: Diagnosis not present

## 2022-09-17 DIAGNOSIS — R0602 Shortness of breath: Secondary | ICD-10-CM | POA: Diagnosis not present

## 2022-09-17 DIAGNOSIS — A419 Sepsis, unspecified organism: Secondary | ICD-10-CM | POA: Diagnosis not present

## 2022-09-17 DIAGNOSIS — K8020 Calculus of gallbladder without cholecystitis without obstruction: Secondary | ICD-10-CM | POA: Diagnosis not present

## 2022-09-17 DIAGNOSIS — Z8616 Personal history of COVID-19: Secondary | ICD-10-CM | POA: Diagnosis not present

## 2022-09-17 DIAGNOSIS — Z560 Unemployment, unspecified: Secondary | ICD-10-CM | POA: Diagnosis not present

## 2022-09-17 DIAGNOSIS — I493 Ventricular premature depolarization: Secondary | ICD-10-CM | POA: Diagnosis not present

## 2022-09-17 DIAGNOSIS — D509 Iron deficiency anemia, unspecified: Secondary | ICD-10-CM | POA: Diagnosis present

## 2022-09-17 DIAGNOSIS — C9590 Leukemia, unspecified not having achieved remission: Secondary | ICD-10-CM | POA: Diagnosis not present

## 2022-09-17 DIAGNOSIS — Z820 Family history of epilepsy and other diseases of the nervous system: Secondary | ICD-10-CM

## 2022-09-17 DIAGNOSIS — C884 Extranodal marginal zone B-cell lymphoma of mucosa-associated lymphoid tissue [MALT-lymphoma]: Secondary | ICD-10-CM | POA: Diagnosis not present

## 2022-09-17 DIAGNOSIS — Z803 Family history of malignant neoplasm of breast: Secondary | ICD-10-CM

## 2022-09-17 DIAGNOSIS — J449 Chronic obstructive pulmonary disease, unspecified: Secondary | ICD-10-CM | POA: Diagnosis present

## 2022-09-17 DIAGNOSIS — R0689 Other abnormalities of breathing: Secondary | ICD-10-CM | POA: Diagnosis not present

## 2022-09-17 DIAGNOSIS — J90 Pleural effusion, not elsewhere classified: Secondary | ICD-10-CM | POA: Diagnosis not present

## 2022-09-17 DIAGNOSIS — Z7952 Long term (current) use of systemic steroids: Secondary | ICD-10-CM | POA: Diagnosis not present

## 2022-09-17 DIAGNOSIS — J9621 Acute and chronic respiratory failure with hypoxia: Secondary | ICD-10-CM | POA: Diagnosis not present

## 2022-09-17 DIAGNOSIS — Z79899 Other long term (current) drug therapy: Secondary | ICD-10-CM | POA: Diagnosis not present

## 2022-09-17 DIAGNOSIS — C8580 Other specified types of non-Hodgkin lymphoma, unspecified site: Secondary | ICD-10-CM | POA: Diagnosis not present

## 2022-09-17 DIAGNOSIS — E1129 Type 2 diabetes mellitus with other diabetic kidney complication: Secondary | ICD-10-CM | POA: Diagnosis not present

## 2022-09-17 DIAGNOSIS — J9611 Chronic respiratory failure with hypoxia: Secondary | ICD-10-CM | POA: Diagnosis not present

## 2022-09-17 DIAGNOSIS — R509 Fever, unspecified: Secondary | ICD-10-CM | POA: Diagnosis not present

## 2022-09-17 DIAGNOSIS — Z87891 Personal history of nicotine dependence: Secondary | ICD-10-CM | POA: Diagnosis not present

## 2022-09-17 DIAGNOSIS — J441 Chronic obstructive pulmonary disease with (acute) exacerbation: Secondary | ICD-10-CM | POA: Diagnosis not present

## 2022-09-17 DIAGNOSIS — N1831 Chronic kidney disease, stage 3a: Secondary | ICD-10-CM | POA: Diagnosis not present

## 2022-09-17 DIAGNOSIS — Z79890 Hormone replacement therapy (postmenopausal): Secondary | ICD-10-CM

## 2022-09-17 DIAGNOSIS — E1165 Type 2 diabetes mellitus with hyperglycemia: Secondary | ICD-10-CM | POA: Diagnosis not present

## 2022-09-17 DIAGNOSIS — Z9981 Dependence on supplemental oxygen: Secondary | ICD-10-CM

## 2022-09-17 DIAGNOSIS — Z801 Family history of malignant neoplasm of trachea, bronchus and lung: Secondary | ICD-10-CM | POA: Diagnosis not present

## 2022-09-17 DIAGNOSIS — D61818 Other pancytopenia: Secondary | ICD-10-CM | POA: Diagnosis not present

## 2022-09-17 DIAGNOSIS — R531 Weakness: Secondary | ICD-10-CM | POA: Diagnosis not present

## 2022-09-17 DIAGNOSIS — J9811 Atelectasis: Secondary | ICD-10-CM | POA: Diagnosis not present

## 2022-09-17 DIAGNOSIS — C8590 Non-Hodgkin lymphoma, unspecified, unspecified site: Secondary | ICD-10-CM | POA: Diagnosis not present

## 2022-09-17 DIAGNOSIS — Z794 Long term (current) use of insulin: Secondary | ICD-10-CM

## 2022-09-17 DIAGNOSIS — D649 Anemia, unspecified: Secondary | ICD-10-CM | POA: Diagnosis not present

## 2022-09-17 DIAGNOSIS — E1122 Type 2 diabetes mellitus with diabetic chronic kidney disease: Secondary | ICD-10-CM | POA: Diagnosis not present

## 2022-09-17 DIAGNOSIS — C8519 Unspecified B-cell lymphoma, extranodal and solid organ sites: Secondary | ICD-10-CM | POA: Diagnosis not present

## 2022-09-17 DIAGNOSIS — E119 Type 2 diabetes mellitus without complications: Secondary | ICD-10-CM | POA: Diagnosis not present

## 2022-09-17 DIAGNOSIS — E039 Hypothyroidism, unspecified: Secondary | ICD-10-CM | POA: Diagnosis present

## 2022-09-17 DIAGNOSIS — R6889 Other general symptoms and signs: Secondary | ICD-10-CM | POA: Diagnosis not present

## 2022-09-17 DIAGNOSIS — I509 Heart failure, unspecified: Secondary | ICD-10-CM | POA: Diagnosis not present

## 2022-09-17 DIAGNOSIS — D630 Anemia in neoplastic disease: Secondary | ICD-10-CM | POA: Diagnosis not present

## 2022-09-17 DIAGNOSIS — C8300 Small cell B-cell lymphoma, unspecified site: Secondary | ICD-10-CM | POA: Diagnosis not present

## 2022-09-17 DIAGNOSIS — Z9181 History of falling: Secondary | ICD-10-CM | POA: Diagnosis not present

## 2022-09-17 DIAGNOSIS — D696 Thrombocytopenia, unspecified: Secondary | ICD-10-CM | POA: Diagnosis present

## 2022-09-17 DIAGNOSIS — R809 Proteinuria, unspecified: Secondary | ICD-10-CM | POA: Diagnosis not present

## 2022-09-17 DIAGNOSIS — Z743 Need for continuous supervision: Secondary | ICD-10-CM | POA: Diagnosis not present

## 2022-09-17 LAB — BASIC METABOLIC PANEL
Anion gap: 7 (ref 5–15)
BUN: 50 mg/dL — ABNORMAL HIGH (ref 8–23)
CO2: 26 mmol/L (ref 22–32)
Calcium: 8.2 mg/dL — ABNORMAL LOW (ref 8.9–10.3)
Chloride: 105 mmol/L (ref 98–111)
Creatinine, Ser: 0.8 mg/dL (ref 0.44–1.00)
GFR, Estimated: >60 mL/min (ref >60)
Glucose, Bld: 153 mg/dL — ABNORMAL HIGH (ref 70–99)
Potassium: 3.6 mmol/L (ref 3.5–5.1)
Sodium: 138 mmol/L (ref 135–145)

## 2022-09-17 LAB — CBC
HCT: 19.8 % — ABNORMAL LOW (ref 36.0–46.0)
Hemoglobin: 6 g/dL — ABNORMAL LOW (ref 12.0–15.0)
MCH: 28.2 pg (ref 26.0–34.0)
MCHC: 30.3 g/dL (ref 30.0–36.0)
MCV: 93 fL (ref 80.0–100.0)
Platelets: 65 10*3/uL — ABNORMAL LOW (ref 150–400)
RBC: 2.13 MIL/uL — ABNORMAL LOW (ref 3.87–5.11)
RDW: 19.1 % — ABNORMAL HIGH (ref 11.5–15.5)
WBC: 6.4 10*3/uL (ref 4.0–10.5)
nRBC: 0.9 % — ABNORMAL HIGH (ref 0.0–0.2)

## 2022-09-17 NOTE — ED Triage Notes (Signed)
First Nurse Note: Pt from home [facility], friend (roommate) called EMS d/t concern high temp & possible lethargic. EMS arrived, stated pt was A&O x4, EMS concer this roommate is "taking advantage of her, multiple EMS calls since beginning september" EMS report pt has no complaints. EMS report afebrile, no meds PTA. EMS stated pt is upset that this roommate keeps calling EMS on her for no reason, pt is not wanting to be here, feels the roommate is dishonest and makes up "stories" to get her out the house to come to the ER. Pt is a cancer pt, but no new complaints tonight. Blood transfusions 2 days PTA. Concern for PA case.

## 2022-09-17 NOTE — ED Notes (Signed)
Rainbow sent to the lab at this time.  

## 2022-09-17 NOTE — ED Triage Notes (Addendum)
Pt comes from home via [facility]. Pt states that roommate called but was telling EMS lies. Pt A&Ox4, afebrile. Pt states now that she would like to see if she has fluid in her lungs. Pt on 4L chronically. NAD at this time. Denies CP

## 2022-09-18 ENCOUNTER — Encounter: Payer: Self-pay | Admitting: Family Medicine

## 2022-09-18 ENCOUNTER — Inpatient Hospital Stay
Admission: EM | Admit: 2022-09-18 | Discharge: 2022-09-24 | DRG: 840 | Disposition: A | Discharge status: Home Health | Payer: Medicare Other | Attending: Internal Medicine | Admitting: Internal Medicine

## 2022-09-18 DIAGNOSIS — R809 Proteinuria, unspecified: Secondary | ICD-10-CM | POA: Diagnosis not present

## 2022-09-18 DIAGNOSIS — I5033 Acute on chronic diastolic (congestive) heart failure: Secondary | ICD-10-CM

## 2022-09-18 DIAGNOSIS — J90 Pleural effusion, not elsewhere classified: Secondary | ICD-10-CM | POA: Diagnosis present

## 2022-09-18 DIAGNOSIS — Z8249 Family history of ischemic heart disease and other diseases of the circulatory system: Secondary | ICD-10-CM | POA: Diagnosis not present

## 2022-09-18 DIAGNOSIS — C8599 Non-Hodgkin lymphoma, unspecified, extranodal and solid organ sites: Secondary | ICD-10-CM | POA: Diagnosis not present

## 2022-09-18 DIAGNOSIS — D709 Neutropenia, unspecified: Secondary | ICD-10-CM | POA: Diagnosis present

## 2022-09-18 DIAGNOSIS — K8020 Calculus of gallbladder without cholecystitis without obstruction: Secondary | ICD-10-CM | POA: Diagnosis not present

## 2022-09-18 DIAGNOSIS — D509 Iron deficiency anemia, unspecified: Secondary | ICD-10-CM

## 2022-09-18 DIAGNOSIS — D61818 Other pancytopenia: Secondary | ICD-10-CM | POA: Diagnosis present

## 2022-09-18 DIAGNOSIS — J449 Chronic obstructive pulmonary disease, unspecified: Secondary | ICD-10-CM | POA: Diagnosis present

## 2022-09-18 DIAGNOSIS — E1122 Type 2 diabetes mellitus with diabetic chronic kidney disease: Secondary | ICD-10-CM | POA: Diagnosis not present

## 2022-09-18 DIAGNOSIS — C8590 Non-Hodgkin lymphoma, unspecified, unspecified site: Secondary | ICD-10-CM | POA: Diagnosis not present

## 2022-09-18 DIAGNOSIS — I509 Heart failure, unspecified: Secondary | ICD-10-CM

## 2022-09-18 DIAGNOSIS — E039 Hypothyroidism, unspecified: Secondary | ICD-10-CM | POA: Diagnosis present

## 2022-09-18 DIAGNOSIS — Z9889 Other specified postprocedural states: Secondary | ICD-10-CM

## 2022-09-18 DIAGNOSIS — Z79899 Other long term (current) drug therapy: Secondary | ICD-10-CM | POA: Diagnosis not present

## 2022-09-18 DIAGNOSIS — C8580 Other specified types of non-Hodgkin lymphoma, unspecified site: Secondary | ICD-10-CM | POA: Diagnosis not present

## 2022-09-18 DIAGNOSIS — E1129 Type 2 diabetes mellitus with other diabetic kidney complication: Secondary | ICD-10-CM | POA: Diagnosis present

## 2022-09-18 DIAGNOSIS — Z820 Family history of epilepsy and other diseases of the nervous system: Secondary | ICD-10-CM | POA: Diagnosis not present

## 2022-09-18 DIAGNOSIS — J9611 Chronic respiratory failure with hypoxia: Secondary | ICD-10-CM | POA: Diagnosis present

## 2022-09-18 DIAGNOSIS — R739 Hyperglycemia, unspecified: Secondary | ICD-10-CM

## 2022-09-18 DIAGNOSIS — C884 Extranodal marginal zone B-cell lymphoma of mucosa-associated lymphoid tissue [MALT-lymphoma]: Secondary | ICD-10-CM

## 2022-09-18 DIAGNOSIS — N1831 Chronic kidney disease, stage 3a: Secondary | ICD-10-CM | POA: Diagnosis not present

## 2022-09-18 DIAGNOSIS — C8300 Small cell B-cell lymphoma, unspecified site: Secondary | ICD-10-CM | POA: Diagnosis present

## 2022-09-18 DIAGNOSIS — R509 Fever, unspecified: Secondary | ICD-10-CM | POA: Diagnosis not present

## 2022-09-18 DIAGNOSIS — Z9981 Dependence on supplemental oxygen: Secondary | ICD-10-CM | POA: Diagnosis not present

## 2022-09-18 DIAGNOSIS — Z87891 Personal history of nicotine dependence: Secondary | ICD-10-CM | POA: Diagnosis not present

## 2022-09-18 DIAGNOSIS — C8519 Unspecified B-cell lymphoma, extranodal and solid organ sites: Secondary | ICD-10-CM | POA: Diagnosis not present

## 2022-09-18 DIAGNOSIS — D696 Thrombocytopenia, unspecified: Secondary | ICD-10-CM | POA: Diagnosis present

## 2022-09-18 DIAGNOSIS — Z79890 Hormone replacement therapy (postmenopausal): Secondary | ICD-10-CM | POA: Diagnosis not present

## 2022-09-18 DIAGNOSIS — Z560 Unemployment, unspecified: Secondary | ICD-10-CM | POA: Diagnosis not present

## 2022-09-18 DIAGNOSIS — D649 Anemia, unspecified: Principal | ICD-10-CM

## 2022-09-18 DIAGNOSIS — Z794 Long term (current) use of insulin: Secondary | ICD-10-CM | POA: Diagnosis not present

## 2022-09-18 DIAGNOSIS — Z8616 Personal history of COVID-19: Secondary | ICD-10-CM | POA: Diagnosis not present

## 2022-09-18 DIAGNOSIS — R899 Unspecified abnormal finding in specimens from other organs, systems and tissues: Secondary | ICD-10-CM

## 2022-09-18 DIAGNOSIS — C8513 Unspecified B-cell lymphoma, intra-abdominal lymph nodes: Secondary | ICD-10-CM | POA: Diagnosis present

## 2022-09-18 DIAGNOSIS — Z801 Family history of malignant neoplasm of trachea, bronchus and lung: Secondary | ICD-10-CM | POA: Diagnosis not present

## 2022-09-18 DIAGNOSIS — Z803 Family history of malignant neoplasm of breast: Secondary | ICD-10-CM | POA: Diagnosis not present

## 2022-09-18 DIAGNOSIS — D630 Anemia in neoplastic disease: Secondary | ICD-10-CM | POA: Diagnosis not present

## 2022-09-18 LAB — BASIC METABOLIC PANEL
Anion gap: 9 (ref 5–15)
BUN: 47 mg/dL — ABNORMAL HIGH (ref 8–23)
CO2: 28 mmol/L (ref 22–32)
Calcium: 8.4 mg/dL — ABNORMAL LOW (ref 8.9–10.3)
Chloride: 106 mmol/L (ref 98–111)
Creatinine, Ser: 0.95 mg/dL (ref 0.44–1.00)
GFR, Estimated: >60 mL/min (ref >60)
Glucose, Bld: 131 mg/dL — ABNORMAL HIGH (ref 70–99)
Potassium: 3.6 mmol/L (ref 3.5–5.1)
Sodium: 143 mmol/L (ref 135–145)

## 2022-09-18 LAB — CBC WITH DIFFERENTIAL/PLATELET
Abs Immature Granulocytes: 0.11 10*3/uL — ABNORMAL HIGH (ref 0.00–0.07)
Basophils Absolute: 0.1 10*3/uL (ref 0.0–0.1)
Basophils Relative: 1 %
Eosinophils Absolute: 0.1 10*3/uL (ref 0.0–0.5)
Eosinophils Relative: 1 %
HCT: 25.2 % — ABNORMAL LOW (ref 36.0–46.0)
Hemoglobin: 7.9 g/dL — ABNORMAL LOW (ref 12.0–15.0)
Immature Granulocytes: 2 %
Lymphocytes Relative: 71 %
Lymphs Abs: 4 10*3/uL (ref 0.7–4.0)
MCH: 28.5 pg (ref 26.0–34.0)
MCHC: 31.3 g/dL (ref 30.0–36.0)
MCV: 91 fL (ref 80.0–100.0)
Monocytes Absolute: 0.4 10*3/uL (ref 0.1–1.0)
Monocytes Relative: 8 %
Neutro Abs: 1 10*3/uL — ABNORMAL LOW (ref 1.7–7.7)
Neutrophils Relative %: 17 %
Platelets: 67 10*3/uL — ABNORMAL LOW (ref 150–400)
RBC: 2.77 MIL/uL — ABNORMAL LOW (ref 3.87–5.11)
RDW: 18.2 % — ABNORMAL HIGH (ref 11.5–15.5)
Smear Review: NORMAL
WBC: 5.7 10*3/uL (ref 4.0–10.5)
nRBC: 1.4 % — ABNORMAL HIGH (ref 0.0–0.2)

## 2022-09-18 LAB — CBC
HCT: 24.7 % — ABNORMAL LOW (ref 36.0–46.0)
Hemoglobin: 7.9 g/dL — ABNORMAL LOW (ref 12.0–15.0)
MCH: 29 pg (ref 26.0–34.0)
MCHC: 32 g/dL (ref 30.0–36.0)
MCV: 90.8 fL (ref 80.0–100.0)
Platelets: 63 10*3/uL — ABNORMAL LOW (ref 150–400)
RBC: 2.72 MIL/uL — ABNORMAL LOW (ref 3.87–5.11)
RDW: 18.5 % — ABNORMAL HIGH (ref 11.5–15.5)
WBC: 5.7 10*3/uL (ref 4.0–10.5)
nRBC: 1.6 % — ABNORMAL HIGH (ref 0.0–0.2)

## 2022-09-18 LAB — TROPONIN I (HIGH SENSITIVITY)
Troponin I (High Sensitivity): 5 ng/L (ref <18)
Troponin I (High Sensitivity): 6 ng/L (ref <18)

## 2022-09-18 LAB — CBG MONITORING, ED
Glucose-Capillary: 152 mg/dL — ABNORMAL HIGH (ref 70–99)
Glucose-Capillary: 274 mg/dL — ABNORMAL HIGH (ref 70–99)
Glucose-Capillary: 132 mg/dL — ABNORMAL HIGH (ref 70–99)

## 2022-09-18 LAB — BRAIN NATRIURETIC PEPTIDE: B Natriuretic Peptide: 150.8 pg/mL — ABNORMAL HIGH (ref 0.0–100.0)

## 2022-09-18 LAB — GLUCOSE, CAPILLARY: Glucose-Capillary: 208 mg/dL — ABNORMAL HIGH (ref 70–99)

## 2022-09-18 LAB — PREPARE RBC (CROSSMATCH)

## 2022-09-18 MED ORDER — FUROSEMIDE 10 MG/ML IJ SOLN
40.0000 mg | Freq: Once | INTRAMUSCULAR | Status: AC
  Administered 2022-09-18: 40 mg via INTRAVENOUS
  Filled 2022-09-18: qty 4

## 2022-09-18 MED ORDER — SENNOSIDES-DOCUSATE SODIUM 8.6-50 MG PO TABS: 1.0000 | ORAL_TABLET | Freq: Every evening | ORAL | Status: DC | PRN

## 2022-09-18 MED ORDER — ALBUTEROL SULFATE (2.5 MG/3ML) 0.083% IN NEBU: 2.5000 mg | INHALATION_SOLUTION | Freq: Four times a day (QID) | RESPIRATORY_TRACT | Status: DC | PRN

## 2022-09-18 MED ORDER — ACETAMINOPHEN 650 MG RE SUPP: 650.0000 mg | Freq: Four times a day (QID) | RECTAL | Status: DC | PRN

## 2022-09-18 MED ORDER — SODIUM CHLORIDE 0.9 % IV SOLN
10.0000 mL/h | Freq: Once | INTRAVENOUS | Status: AC
  Administered 2022-09-24: 10 mL/h via INTRAVENOUS

## 2022-09-18 MED ORDER — LEVOTHYROXINE SODIUM 50 MCG PO TABS
50.0000 ug | ORAL_TABLET | Freq: Every day | ORAL | Status: DC
  Administered 2022-09-18 – 2022-09-24 (×6): 50 ug via ORAL
  Filled 2022-09-18 (×7): qty 1

## 2022-09-18 MED ORDER — IPRATROPIUM-ALBUTEROL 0.5-2.5 (3) MG/3ML IN SOLN
3.0000 mL | Freq: Three times a day (TID) | RESPIRATORY_TRACT | Status: DC
  Administered 2022-09-18 – 2022-09-24 (×17): 3 mL via RESPIRATORY_TRACT
  Filled 2022-09-18 (×17): qty 3

## 2022-09-18 MED ORDER — ACYCLOVIR 200 MG PO CAPS
400.0000 mg | ORAL_CAPSULE | Freq: Every day | ORAL | Status: DC
  Administered 2022-09-18 – 2022-09-24 (×7): 400 mg via ORAL
  Filled 2022-09-18 (×7): qty 2

## 2022-09-18 MED ORDER — INSULIN ASPART 100 UNIT/ML IJ SOLN
0.0000 [IU] | Freq: Every day | INTRAMUSCULAR | Status: DC
  Administered 2022-09-18 – 2022-09-22 (×3): 2 [IU] via SUBCUTANEOUS
  Filled 2022-09-18 (×3): qty 1

## 2022-09-18 MED ORDER — SODIUM CHLORIDE 0.9% FLUSH
3.0000 mL | Freq: Two times a day (BID) | INTRAVENOUS | Status: DC
  Administered 2022-09-18 – 2022-09-24 (×12): 3 mL via INTRAVENOUS

## 2022-09-18 MED ORDER — FUROSEMIDE 10 MG/ML IJ SOLN
40.0000 mg | Freq: Two times a day (BID) | INTRAMUSCULAR | Status: DC
  Administered 2022-09-18 – 2022-09-20 (×5): 40 mg via INTRAVENOUS
  Filled 2022-09-18 (×5): qty 4

## 2022-09-18 MED ORDER — INSULIN ASPART 100 UNIT/ML IJ SOLN
0.0000 [IU] | Freq: Three times a day (TID) | INTRAMUSCULAR | Status: DC
  Administered 2022-09-18: 3 [IU] via SUBCUTANEOUS
  Administered 2022-09-18: 1 [IU] via SUBCUTANEOUS
  Administered 2022-09-19: 4 [IU] via SUBCUTANEOUS
  Administered 2022-09-19: 3 [IU] via SUBCUTANEOUS
  Administered 2022-09-19: 1 [IU] via SUBCUTANEOUS
  Administered 2022-09-20: 2 [IU] via SUBCUTANEOUS
  Administered 2022-09-20: 3 [IU] via SUBCUTANEOUS
  Administered 2022-09-21 – 2022-09-22 (×2): 1 [IU] via SUBCUTANEOUS
  Administered 2022-09-22: 2 [IU] via SUBCUTANEOUS
  Filled 2022-09-18 (×10): qty 1

## 2022-09-18 MED ORDER — OXYCODONE HCL 5 MG PO TABS
5.0000 mg | ORAL_TABLET | ORAL | Status: DC | PRN
  Administered 2022-09-19 – 2022-09-23 (×6): 5 mg via ORAL
  Filled 2022-09-18 (×5): qty 1

## 2022-09-18 MED ORDER — ACETAMINOPHEN 325 MG PO TABS
650.0000 mg | ORAL_TABLET | Freq: Four times a day (QID) | ORAL | Status: DC | PRN
  Administered 2022-09-21: 650 mg via ORAL
  Filled 2022-09-18: qty 2

## 2022-09-18 NOTE — ED Notes (Signed)
Breakfast meal tray given at this time.  

## 2022-09-18 NOTE — ED Provider Notes (Signed)
[facility] Provider Note    Event Date/Time   First MD Initiated Contact with Patient 09/18/22 [phone]     (approximate)   History   Shortness of Breath   HPI  Natasha [name] is a 76 y.o. female with history of lymphoma, COPD, diabetes, CHF who presents to the emergency department complaints of shortness of breath.  No chest pain, fevers, cough.  No vomiting, diarrhea.  No bloody stools or melena.  History of chronic anemia with recent transfusion a few days ago.   History provided by patient.    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 Authorizing Provider  acalabrutinib maleate (CALQUENCE) 100 MG tablet Take 1 tablet (100 mg total) by mouth 2 (two) times daily. 08/30/22   RSindy Guadeloupe MD  acyclovir (ZOVIRAX) 400 MG tablet Take 400 mg by mouth daily. 08/09/22   [provider]  albuterol (VENTOLIN HFA) 108 (90 Base) MCG/ACT inhaler TAKE 2 PUFFS BY MOUTH EVERY 6 HOURS AS NEEDED FOR WHEEZE OR SHORTNESS OF BREATH 06/17/22   Sreenath, Sudheer B, MD  clobetasol cream (TEMOVATE) 08.54% Apply 1 Application topically daily. 08/25/22   [provider]  furosemide (LASIX) 20 MG tablet Take 1 tablet (20 mg total) by mouth daily. 09/09/22   HAlisa Graff FNP  insulin NPH-regular Human (70-30) 100 UNIT/ML injection Inject 5 Units into the skin 2 (two) times daily with a meal. Use only for hyperglycemia, CBG+  300. Start with 5 units, titrate up to max 10 unit per dose if need. Patient taking differently: Inject 10 Units into the skin 2 (two) times daily with a meal. 08/17/22   PFritzi Mandes MD  insulin regular (NOVOLIN R) 100 units/mL injection Inject 5-10 Units into the skin 3 (three) times daily before meals. BG 200-299 give 5 units BG 300-399 give 10 units.    [provider]  Ipratropium-Albuterol (COMBIVENT RESPIMAT) 20-100 MCG/ACT AERS respimat Inhale 1 puff into the lungs 3 (three) times daily.    [provider]  levothyroxine (SYNTHROID) 50 MCG tablet Take 1 tablet (50 mcg total) by mouth daily at 6 (six) AM. 07/17/22   ZSharen Hones MD  lidocaine-prilocaine (EMLA) cream Apply to affected area once Patient taking differently: 1 Application. Apply to mediport site 2 hours before infusion 07/28/22   RSindy Guadeloupe MD  loperamide (IMODIUM) 2 MG capsule Take 2 mg by mouth as needed for diarrhea or loose stools.    [provider]  nicotine (NICODERM CQ - DOSED IN MG/24 HOURS) 21 mg/24hr patch Place 21 mg onto the skin daily.    [provider]  ondansetron (ZOFRAN) 8 MG tablet Take 8 mg by mouth every 8 (eight) hours as needed for nausea, vomiting or refractory nausea / vomiting. Take 1 tablet q6H  for 4 days after chemotherapy. 08/09/22   [provider]  prochlorperazine (COMPAZINE) 10 MG tablet Take 1 tablet by mouth every 6 (six) hours as needed for nausea or vomiting. Take 1 tab q6H for 4 days after chemotherapy. 08/09/22   [provider]  simethicone (MYLICON) 80 MG chewable tablet Chew 180 mg by mouth every 6 (six) hours as needed for flatulence. Take 1 capsule PO PRN gas relief.    [provider]    Physical Exam   Triage Vital Signs: ED Triage Vitals  Enc Vitals Group     BP 09/17/22 2215 94/75     Pulse Rate 09/17/22 2215 94     Resp 09/17/22 2215 18     Temp 09/17/22 2215 98.5 F (36.9 C)     Temp Source 09/17/22 2215  Oral     SpO2 09/17/22 2131 100 %     Weight 09/17/22 2216 140 lb (63.5 kg)     Height 09/17/22 2216 '5\' 5"'$  (1.651 m)     Head Circumference --      Peak Flow --      Pain Score 09/17/22 2242 0     Pain Loc --      Pain Edu? --      Excl. in [place]? --     Most recent vital signs: Vitals:   09/18/22 0324 09/18/22 0550  BP:  (!) 95/56  Pulse: 95 90  Resp: 19 (!) 21  Temp:    SpO2: 100% 99%    CONSTITUTIONAL: Alert and oriented and responds appropriately to questions.  Elderly, chronically ill-appearing HEAD: Normocephalic, atraumatic EYES: Conjunctivae clear, pupils appear equal, sclera nonicteric ENT: normal nose; moist mucous membranes NECK: Supple, normal ROM CARD: RRR; S1 and S2 appreciated; no murmurs, no clicks, no rubs, no gallops RESP: Normal chest excursion without splinting or tachypnea; breath sounds clear and equal bilaterally; no wheezes, patient has bibasilar rales and diminished aeration at her bases.  No hypoxia currently on nasal cannula and speaking full sentences.  No distress. ABD/GI: Normal bowel sounds; non-distended; soft, non-tender, no rebound, no guarding, no peritoneal signs BACK: The back appears normal EXT: Normal ROM in all joints; no deformity noted, no edema; no cyanosis, no calf tenderness or calf swelling SKIN: Normal color for age and race; warm; no rash on exposed skin NEURO: Moves all extremities equally, normal speech PSYCH: The patient's mood and manner are appropriate.   ED Results / Procedures / Treatments   LABS: (all labs ordered are listed, but only abnormal results are displayed) Labs Reviewed  BRAIN NATRIURETIC PEPTIDE - Abnormal; Notable for the following components:      Result Value   B Natriuretic Peptide 150.8 (*)    All other components within normal limits  CBC - Abnormal; Notable for the following components:   RBC 2.13 (*)    Hemoglobin 6.0 (*)    HCT 19.8 (*)    RDW 19.1 (*)    Platelets 65 (*)    nRBC 0.9 (*)     All other components within normal limits  BASIC METABOLIC PANEL - Abnormal; Notable for the following components:   Glucose, Bld 153 (*)    BUN 50 (*)    Calcium 8.2 (*)    All other components within normal limits  PREPARE RBC (CROSSMATCH)  TYPE AND SCREEN  TROPONIN I (HIGH SENSITIVITY)  TROPONIN I (HIGH SENSITIVITY)     EKG:   Date: 09/17/2022 22:46  Rate: 92  Rhythm:  normal sinus rhythm  QRS Axis: normal  Intervals: normal  ST/T Wave abnormalities: normal  Conduction Disutrbances: none  Narrative Interpretation: unremarkable, significant artifact      RADIOLOGY: My personal review and interpretation of imaging: Chest x-ray shows vascular congestion, bilateral pleural effusions  I have personally reviewed all radiology reports.   DG Chest 2 View  Result Date: 09/17/2022 CLINICAL DATA:  Shortness of breath EXAM: CHEST - 2 VIEW COMPARISON:  08/30/2022 FINDINGS: Cardiac shadow is within normal limits. Right chest wall port is seen and stable. Bilateral pleural effusions are noted right considerably greater than left increased from the prior exam. Changes of central vascular congestion are noted consistent with CHF. IMPRESSION: Vascular congestion with increasing pleural effusions right greater than left. Electronically Signed   By: Inez Catalina M.D.   On: 09/17/2022 23:16     PROCEDURES:  Critical Care performed: Yes, see critical care procedure note(s)   CRITICAL CARE Performed by: Cyril Mourning Corbo   Total critical care time: 45 minutes  Critical care time was exclusive of separately billable procedures and treating other patients.  Critical care was necessary to treat or prevent imminent or life-threatening deterioration.  Critical care was time spent personally by me on the following activities: development of treatment plan with patient and/or surrogate as well as nursing, discussions with consultants, evaluation of patient's response to treatment, examination of  patient, obtaining history from patient or surrogate, ordering and performing treatments and interventions, ordering and review of laboratory studies, ordering and review of radiographic studies, pulse oximetry and re-evaluation of patient's condition.   Marland Kitchen1-3 Lead EKG Interpretation  Performed by: Atienza, Delice Bison, DO Authorized by: Wolters, Delice Bison, DO     Interpretation: normal     ECG rate:  95   ECG rate assessment: normal     Rhythm: sinus rhythm     Ectopy: none     Conduction: normal       IMPRESSION / MDM / ASSESSMENT AND PLAN / ED COURSE  I reviewed the triage vital signs and the nursing notes.    Patient here with shortness of breath.  History of COPD, CHF and anemia.  The patient is on the cardiac monitor to evaluate for evidence of arrhythmia and/or significant heart rate changes.   DIFFERENTIAL DIAGNOSIS (includes but not limited to):   COPD, CHF, symptomatic anemia, pneumonia, COVID, viral URI, ACS, PE   Patient's presentation is most consistent with acute presentation with potential threat to life or bodily function.   PLAN: Patient's labs show hemoglobin of 6.0.  Was 5.9 on November 21 and she was transfused.  She denies any symptoms of bleeding.  Normal electrolytes and renal function.  Troponin negative.  BNP mildly elevated and chest x-ray shows vascular congestion with enlarging pleural effusions right greater than left when reviewed/interpreted by myself and the radiologist.  Will give 2 units of packed red blood cells and IV Lasix.  Will discuss with hospitalist for admission for CHF exacerbation and symptomatic anemia.   MEDICATIONS GIVEN IN ED: Medications  0.9 %  sodium chloride infusion (has no administration in time range)  acyclovir (ZOVIRAX) tablet 400 mg (has no administration in time range)  levothyroxine (SYNTHROID) tablet 50 mcg (has no administration in time range)  Ipratropium-Albuterol (COMBIVENT) respimat 1 puff (has no administration in  time range)  albuterol (PROVENTIL) (2.5 MG/3ML) 0.083% nebulizer solution 2.5 mg (has no administration in time range)  insulin aspart (novoLOG) injection 0-6 Units (has no administration in time  range)  insulin aspart (novoLOG) injection 0-5 Units (has no administration in time range)  sodium chloride flush (NS) 0.9 % injection 3 mL (has no administration in time range)  acetaminophen (TYLENOL) tablet 650 mg (has no administration in time range)    Or  acetaminophen (TYLENOL) suppository 650 mg (has no administration in time range)  oxyCODONE (Oxy IR/ROXICODONE) immediate release tablet 5 mg (has no administration in time range)  senna-docusate (Senokot-S) tablet 1 tablet (has no administration in time range)  furosemide (LASIX) injection 40 mg (has no administration in time range)  furosemide (LASIX) injection 40 mg (40 mg Intravenous Given 09/18/22 0414)     ED COURSE:  Consulted and discussed patient's case with hospitalist, Dr. Myna Hidalgo.  I have recommended admission and consulting physician agrees and will place admission orders.  Patient (and family if present) agree with this plan.   I reviewed all nursing notes, vitals, pertinent previous records.  All labs, EKGs, imaging ordered have been independently reviewed and interpreted by myself.       OUTSIDE RECORDS REVIEWED: Reviewed patient's last transfusion notes on 09/15/2022.       FINAL CLINICAL IMPRESSION(S) / ED DIAGNOSES   Final diagnoses:  Symptomatic anemia  Acute on chronic congestive heart failure, unspecified heart failure type ([place])     Rx / DC Orders   ED Discharge Orders     None        Note:  This document was prepared using Dragon voice recognition software and may include unintentional dictation errors.   Begue, Delice Bison, DO 09/18/22 [phone]

## 2022-09-18 NOTE — ED Notes (Signed)
Daughter, [name], called at this time to speak to patient

## 2022-09-18 NOTE — ED Notes (Signed)
This tech called lab about pt first troponin not being resulted. I spoke with Olen Cordial in the lab who told me that he "does not have any orders for a troponin." This tech informed Olen Cordial that orders were put in at 2245 and blood was sent.

## 2022-09-18 NOTE — ED Notes (Signed)
Pt received 313m of first bag of blood.

## 2022-09-18 NOTE — H&P (Signed)
History and Physical    Natasha [name] ONG:[id number] DOB: August 27, 1946 DOA: 09/18/2022  PCP: [name], NP   Patient coming from: Home   Chief Complaint: SOB, increased leg swelling, fatigue   HPI: Natasha [name] is a 76 y.o. 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  Patient reports insidiously worsening shortness of breath, fatigue, and leg swelling over recent days to weeks.  She has nonproductive cough but denies fever or chest pain.  She denies any melena, hematochezia, or other signs of bleeding.    She was hospitalized earlier this month with acute on chronic hypoxic respiratory failure and underwent treatment for acute CHF, COPD exacerbation, and had thoracentesis performed.  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    Since the recent hospitalization, she was started on acalabrutinib and had received blood transfusions; most recently, she was transfused 2 units of RBC on 09/14/2022.  ED Course: Upon arrival to the ED, patient is found to be afebrile and saturating well on her usual 4 L/min of supplemental oxygen.  Chest x-ray notable for vascular congestion and increased pleural effusions, right greater than left.  Blood work notable for hemoglobin 6.0, platelets 65,000, BUN 50, BNP 151, and troponin normal x 2.  Patient was given 40 mg IV Lasix in the ED and 2 units of RBC were ordered for transfusion.  Review of Systems:  All other systems reviewed and apart from HPI, are negative.  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 Her smoking use included cigarettes. She started smoking about 61 years ago. She has a 110.00 pack-year smoking history. She has never used smokeless tobacco. She reports that she does not drink alcohol and does not use drugs.  No Known Allergies  Family History  Problem Relation Age of Onset   Breast cancer Mother    Alzheimer's disease Father    Lung cancer Father        lung   Varicose Veins Brother    Heart attack Brother      Prior to Admission medications   Medication Sig Start Date End Date Taking? Authorizing Provider  acalabrutinib maleate (CALQUENCE) 100 MG tablet Take 1 tablet (100 mg total) by mouth 2 (two) times daily. 08/30/22   RSindy Guadeloupe MD  acyclovir (ZOVIRAX) 400 MG tablet Take 400 mg by mouth daily. 08/09/22   [provider]  albuterol (VENTOLIN HFA) 108 (90 Base) MCG/ACT inhaler TAKE 2 PUFFS BY MOUTH EVERY 6 HOURS AS NEEDED FOR WHEEZE OR SHORTNESS OF BREATH 06/17/22   SRalene MuskratB, MD  clobetasol cream (TEMOVATE) 0.05 % Apply 1  Application topically daily. 08/25/22   [provider]  furosemide (LASIX) 20 MG tablet Take 1 tablet (20 mg total) by mouth daily. 09/09/22   Alisa Graff, FNP  insulin NPH-regular Human (70-30) 100 UNIT/ML injection Inject 5 Units into the skin 2 (two) times daily with a meal. Use only for hyperglycemia, CBG+ 300. Start with 5 units, titrate up to max 10 unit per dose if need. Patient taking differently: Inject 10 Units into the skin 2 (two) times daily with a meal. 08/17/22   Fritzi Mandes, MD  insulin regular (NOVOLIN R) 100 units/mL injection  Inject 5-10 Units into the skin 3 (three) times daily before meals. BG 200-299 give 5 units BG 300-399 give 10 units.    [provider]  Ipratropium-Albuterol (COMBIVENT RESPIMAT) 20-100 MCG/ACT AERS respimat Inhale 1 puff into the lungs 3 (three) times daily.    [provider]  levothyroxine (SYNTHROID) 50 MCG tablet Take 1 tablet (50 mcg total) by mouth daily at 6 (six) AM. 07/17/22   Sharen Hones, MD  lidocaine-prilocaine (EMLA) cream Apply to affected area once Patient taking differently: 1 Application. Apply to mediport site 2 hours before infusion 07/28/22   Sindy Guadeloupe, MD  loperamide (IMODIUM) 2 MG capsule Take 2 mg by mouth as needed for diarrhea or loose stools.    [provider]  nicotine (NICODERM CQ - DOSED IN MG/24 HOURS) 21 mg/24hr patch Place 21 mg onto the skin daily.    [provider]  ondansetron (ZOFRAN) 8 MG tablet Take 8 mg by mouth every 8 (eight) hours as needed for nausea, vomiting or refractory nausea / vomiting. Take 1 tablet q6H for 4 days after chemotherapy. 08/09/22   [provider]  prochlorperazine (COMPAZINE) 10 MG tablet Take 1 tablet by mouth every 6 (six) hours as needed for nausea or vomiting. Take 1 tab q6H for 4 days after chemotherapy. 08/09/22   [provider]  simethicone (MYLICON) 80 MG chewable tablet Chew 180 mg by mouth every 6 (six) hours as needed for flatulence. Take 1 capsule PO PRN gas relief.    [provider]    Physical Exam: Vitals:   09/17/22 2216 09/18/22 0238 09/18/22 0323 09/18/22 0324  BP:  (!) 102/51 112/68   Pulse:  91  95  Resp:  17 (!) 21 19  Temp:  98.4 F (36.9 C)    TempSrc:  Oral    SpO2:  98%  100%  Weight: 63.5 kg     Height: '5\' 5"'$  (1.651 m)       Constitutional: NAD, calm  Eyes: PERTLA, lids and conjunctivae normal ENMT: Mucous membranes are moist. Posterior pharynx clear of any exudate or lesions.   Neck: supple, no masses  Respiratory:  Diminished bilaterally, no wheezing. No accessory muscle use.  Cardiovascular: S1 & S2 heard, regular rate and rhythm. Pitting edema involving b/l LEs.  Abdomen: No distension, no tenderness, soft. Bowel sounds active.  Musculoskeletal: no clubbing / cyanosis. No joint deformity upper and lower extremities.   Skin: Pale. Warm, dry, well-perfused. Neurologic: CN 2-12 grossly intact. Moving all extremities. Alert and oriented.  Psychiatric: Pleasant. Cooperative.    Labs and Imaging on Admission: I have personally reviewed following labs and imaging studies  CBC: Recent Labs  Lab 09/11/22 1117 09/14/22 1159 09/17/22 2250  WBC 6.2 7.5 6.4  NEUTROABS  --  1.3*  --   HGB 7.2* 5.9* 6.0*  HCT 24.6* 20.5* 19.8*  MCV 90.4 94.0  93.0  PLT 72* 60* 65*   Basic Metabolic Panel: Recent Labs  Lab 09/11/22 1117 09/14/22 1159 09/17/22 2250  NA 139 140 138  K 4.8 3.9 3.6  CL 106 107 105  CO2 '27 30 26  '$ GLUCOSE 244* 119* 153*  BUN 40* 37* 50*  CREATININE 0.79 0.82 0.80  CALCIUM 8.2* 8.2* 8.2*   GFR: Estimated Creatinine Clearance: 55.5 mL/min (by C-G formula based on SCr of 0.8 mg/dL). Liver Function Tests: Recent Labs  Lab 09/14/22 1159  AST 22  ALT 15  ALKPHOS 145*  BILITOT 1.2  PROT 5.0*  ALBUMIN 3.0*   No results for input(s): "LIPASE", "AMYLASE" in the last 168 hours. No results for input(s): "AMMONIA" in the last 168 hours. Coagulation Profile: No results for input(s): "INR", "PROTIME" in the last 168 hours. Cardiac Enzymes: No results for input(s): "CKTOTAL", "CKMB", "CKMBINDEX", "TROPONINI" in the last 168 hours. BNP (last 3 results) No results for input(s): "PROBNP" in the last 8760 hours. HbA1C: No results for input(s): "HGBA1C" in the last 72 hours. CBG: No results for input(s): "GLUCAP" in the last 168 hours. Lipid Profile: No results for input(s): "CHOL", "HDL", "LDLCALC", "TRIG", "CHOLHDL", "LDLDIRECT" in the last 72 hours. Thyroid Function Tests: No  results for input(s): "TSH", "T4TOTAL", "FREET4", "T3FREE", "THYROIDAB" in the last 72 hours. Anemia Panel: No results for input(s): "VITAMINB12", "FOLATE", "FERRITIN", "TIBC", "IRON", "RETICCTPCT" in the last 72 hours. Urine analysis:    Component Value Date/Time   COLORURINE AMBER (A) 06/11/2018 1308   APPEARANCEUR CLOUDY (A) 06/11/2018 1308   LABSPEC 1.022 06/11/2018 1308   PHURINE 5.0 06/11/2018 1308   GLUCOSEU >=500 (A) 06/11/2018 1308   HGBUR LARGE (A) 06/11/2018 1308   BILIRUBINUR NEGATIVE 06/11/2018 1308   KETONESUR NEGATIVE 06/11/2018 1308   PROTEINUR 30 (A) 06/11/2018 1308   NITRITE NEGATIVE 06/11/2018 1308   LEUKOCYTESUR LARGE (A) 06/11/2018 1308   Sepsis Labs: '@LABRCNTIP'$ (procalcitonin:4,lacticidven:4) )No results found for this or any previous visit (from the past 240 hour(s)).   Radiological Exams on Admission: DG Chest 2 View  Result Date: 09/17/2022 CLINICAL DATA:  Shortness of breath EXAM: CHEST - 2 VIEW COMPARISON:  08/30/2022 FINDINGS: Cardiac shadow is within normal limits. Right chest wall port is seen and stable. Bilateral pleural effusions are noted right considerably greater than left increased from the prior exam. Changes of central vascular congestion are noted consistent with CHF. IMPRESSION: Vascular congestion with increasing pleural effusions right greater than left. Electronically Signed   By: Inez Catalina M.D.   On: 09/17/2022 23:16    EKG: Independently reviewed. Sinus rhythm.   Assessment/Plan   1. Symptomatic anemia; thrombocytopenia   - Hgb 6.0 on admission (was 5.9 on 09/14/22 and she was transfused 2 units RBC then); platelets appear stable at 65,000  - No overt bleeding, likely secondary to lymphoma and possibly acalabrutinib  - 2 units RBC ordered from ED  - Check post-transfusion CBC    2. Acute on chronic HFpEF  - She reports increased peripheral edema and has CHF findings on CXR  - She was given 40 mg IV Lasix in ED  - Continue  diuresis with IV Lasix, monitor wt and I/Os, monitor renal function and electrolytes    3. Recurrent pleural effusion - Bilateral pleural effusions noted, R>L  - She had thoracentesis on 08/30/22 with fluid analysis equivocal in terms of transudate vs exudate but cytology was consistent with her B-cell lymphoma  - Consider repeat thoracentesis if she remains dyspneic after diuresis  4. Marginal zone lymphoma  - Recently started acalabrutinib  - Continue oncology follow-up as planned after discharge    5. COPD; chronic hypoxic respiratory failure  - Not in exacerbation on admission  - Continue supplemental O2, continue inhalers    6. Hypothyroidism  - Continue Synthroid    7. Type II DM  - A1c was 5.5% in August 2023  - Check CBGs and use low-intensity SSI     DVT prophylaxis: SCDs  Code Status: Full  Level of Care: Level of care: Telemetry Cardiac Family Communication: none present  Disposition Plan:  Patient is from: home  Anticipated d/c is to: TBD Anticipated d/c date is: 11/26 or 11/12/01/21  Patient currently: Pending post-transfusion CBC, improved volume status, transition back to oral diuretic  Consults called: none  Admission status: Observation     Vianne Bulls, MD [facility]  09/18/2022, 5:50 AM

## 2022-09-18 NOTE — Progress Notes (Addendum)
PROGRESS NOTE   HPI was taken from Dr. Myna Hidalgo: Genee Rann Sula is a 76 y.o. 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   Patient reports insidiously worsening shortness of breath, fatigue, and leg swelling over recent days to weeks.  She has nonproductive cough but denies fever or chest pain.  She denies any melena, hematochezia, or other signs of bleeding.     She was hospitalized earlier this month with acute on chronic hypoxic respiratory failure and underwent treatment for acute CHF, COPD exacerbation, and had thoracentesis performed.  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     Since the recent hospitalization, she was started on acalabrutinib and had received blood transfusions; most recently, she was transfused 2 units of RBC on 09/14/2022.   ED Course: Upon arrival to the ED, patient is found to be afebrile and saturating well on her usual 4 L/min of supplemental oxygen.  Chest x-ray notable for vascular congestion and increased pleural effusions, right greater than left.  Blood work notable for hemoglobin 6.0, platelets 65,000, BUN 50, BNP 151, and troponin normal x 2.   Patient was given 40 mg IV Lasix in the ED and 2 units of RBC were ordered for transfusion.    Delissa Silba Zayas  LZJ:[id number] DOB: 11-23-1945 DOA: 09/18/2022 PCP: [name], NP   Assessment & Plan:   Principal Problem:   Symptomatic anemia Active Problems:   Acute on chronic heart failure with preserved ejection fraction (HFpEF) (HCC)   Marginal zone lymphoma (HCC)   Hypothyroidism   Thrombocytopenia ([facility])   Type 2 diabetes mellitus with proteinuria (HCC)   Chronic obstructive pulmonary disease (COPD) ([facility])    Chronic respiratory failure with hypoxia (HCC)  Assessment and Plan: Symptomatic anemia: etiology unclear, possibly secondary to marginal zone lymphoma vs acalabrutinib use. Will transfuse 2 units of pRBCs. Repeat H&H ordered.   Thrombocytopenia: etiology unclear, possibly secondary to marginal zone lymphoma vs acalabrutinib use. No need for a transfusion currently. Will continue to monitor    Acute on chronic diastolic CHF: continue on IV lasix. Monitor I/Os    B/l recurrent pleural effusion: R>L. S/p thoracentesis on 08/30/22 with fluid analysis equivocal in terms of transudate vs exudate but cytology was consistent with her B-cell lymphoma. May need a thoracentesis    Marginal zone lymphoma: was on acalabrutinib. Management per onco outpatient    COPD: w/o exacerbation. Continue on bronchodilators    Hypothyroidism: continue on synthroid    DM2: well controlled, HbA1c 5.5 in 05/2022. Continue on SSI w/ accuchecks         DVT prophylaxis: SCDs Code Status: full  Family Communication:  Disposition Plan: likely d/c back home   Level of care: Telemetry Cardiac  Status is: Observation The patient remains OBS appropriate and will d/c before 2 midnights.    Consultants:    Procedures:  Antimicrobials:   Subjective: Pt c/o malaise  Objective: Vitals:   09/18/22 0700 09/18/22 0715 09/18/22 0730 09/18/22 0815  BP: (!) 91/47 (!) 97/48 (!) 88/59 (!) 103/53  Pulse: 89 88 90 91  Resp: '19 19  18  '$ Temp:      TempSrc:      SpO2: 100% 100% 93% 100%  Weight:      Height:  Intake/Output Summary (Last 24 hours) at 09/18/2022 0834 Last data filed at 09/18/2022 9381 Gross per 24 hour  Intake 380 ml  Output --  Net 380 ml   Filed Weights   09/17/22 2216  Weight: 63.5 kg    Examination:  General exam: Appears calm and comfortable  Respiratory system: decreased breath sounds b/l  Cardiovascular system: S1 & S2 +. No rubs, gallops or clicks. Gastrointestinal  system: Abdomen is nondistended, soft and nontender.Normal bowel sounds heard. Central nervous system: Alert and oriented. Moves all extremities  Psychiatry: Judgement and insight appear normal. Flat mood and affect    Data Reviewed: I have personally reviewed following labs and imaging studies  CBC: Recent Labs  Lab 09/11/22 1117 09/14/22 1159 09/17/22 2250  WBC 6.2 7.5 6.4  NEUTROABS  --  1.3*  --   HGB 7.2* 5.9* 6.0*  HCT 24.6* 20.5* 19.8*  MCV 90.4 94.0 93.0  PLT 72* 60* 65*   Basic Metabolic Panel: Recent Labs  Lab 09/11/22 1117 09/14/22 1159 09/17/22 2250  NA 139 140 138  K 4.8 3.9 3.6  CL 106 107 105  CO2 '27 30 26  '$ GLUCOSE 244* 119* 153*  BUN 40* 37* 50*  CREATININE 0.79 0.82 0.80  CALCIUM 8.2* 8.2* 8.2*   GFR: Estimated Creatinine Clearance: 55.5 mL/min (by C-G formula based on SCr of 0.8 mg/dL). Liver Function Tests: Recent Labs  Lab 09/14/22 1159  AST 22  ALT 15  ALKPHOS 145*  BILITOT 1.2  PROT 5.0*  ALBUMIN 3.0*   No results for input(s): "LIPASE", "AMYLASE" in the last 168 hours. No results for input(s): "AMMONIA" in the last 168 hours. Coagulation Profile: No results for input(s): "INR", "PROTIME" in the last 168 hours. Cardiac Enzymes: No results for input(s): "CKTOTAL", "CKMB", "CKMBINDEX", "TROPONINI" in the last 168 hours. BNP (last 3 results) No results for input(s): "PROBNP" in the last 8760 hours. HbA1C: No results for input(s): "HGBA1C" in the last 72 hours. CBG: Recent Labs  Lab 09/18/22 0731  GLUCAP 152*   Lipid Profile: No results for input(s): "CHOL", "HDL", "LDLCALC", "TRIG", "CHOLHDL", "LDLDIRECT" in the last 72 hours. Thyroid Function Tests: No results for input(s): "TSH", "T4TOTAL", "FREET4", "T3FREE", "THYROIDAB" in the last 72 hours. Anemia Panel: No results for input(s): "VITAMINB12", "FOLATE", "FERRITIN", "TIBC", "IRON", "RETICCTPCT" in the last 72 hours. Sepsis Labs: No results for input(s): "PROCALCITON",  "LATICACIDVEN" in the last 168 hours.  No results found for this or any previous visit (from the past 240 hour(s)).       Radiology Studies: DG Chest 2 View  Result Date: 09/17/2022 CLINICAL DATA:  Shortness of breath EXAM: CHEST - 2 VIEW COMPARISON:  08/30/2022 FINDINGS: Cardiac shadow is within normal limits. Right chest wall port is seen and stable. Bilateral pleural effusions are noted right considerably greater than left increased from the prior exam. Changes of central vascular congestion are noted consistent with CHF. IMPRESSION: Vascular congestion with increasing pleural effusions right greater than left. Electronically Signed   By: Inez Catalina M.D.   On: 09/17/2022 23:16        Scheduled Meds:  acyclovir  400 mg Oral Daily   furosemide  40 mg Intravenous BID   insulin aspart  0-5 Units Subcutaneous QHS   insulin aspart  0-6 Units Subcutaneous TID WC   ipratropium-albuterol  3 mL Inhalation TID   levothyroxine  50 mcg Oral Q0600   sodium chloride flush  3 mL Intravenous Q12H   Continuous Infusions:  sodium  chloride       LOS: 0 days    Time spent: 35 mins     Wyvonnia Dusky, MD [facility] Pager [phone]  If 7PM-7AM, please contact night-coverage [email or web address] 09/18/2022, 8:34 AM

## 2022-09-18 NOTE — ED Notes (Signed)
Assisted pt back to bed at this time from the toilet, pt ambulatory at this time.

## 2022-09-19 ENCOUNTER — Inpatient Hospital Stay: Payer: Medicare Other

## 2022-09-19 DIAGNOSIS — J90 Pleural effusion, not elsewhere classified: Secondary | ICD-10-CM | POA: Diagnosis not present

## 2022-09-19 DIAGNOSIS — D509 Iron deficiency anemia, unspecified: Secondary | ICD-10-CM | POA: Diagnosis not present

## 2022-09-19 DIAGNOSIS — I5033 Acute on chronic diastolic (congestive) heart failure: Secondary | ICD-10-CM | POA: Diagnosis not present

## 2022-09-19 DIAGNOSIS — C8580 Other specified types of non-Hodgkin lymphoma, unspecified site: Secondary | ICD-10-CM | POA: Diagnosis not present

## 2022-09-19 LAB — BPAM RBC
Blood Product Expiration Date: 202312182359
Blood Product Expiration Date: 202312202359
ISSUE DATE / TIME: 202311250633
ISSUE DATE / TIME: 202311251002
Unit Type and Rh: 5100
Unit Type and Rh: 5100

## 2022-09-19 LAB — TYPE AND SCREEN
ABO/RH(D): O POS
Antibody Screen: NEGATIVE
Unit division: 0
Unit division: 0

## 2022-09-19 LAB — GLUCOSE, CAPILLARY
Glucose-Capillary: 190 mg/dL — ABNORMAL HIGH (ref 70–99)
Glucose-Capillary: 301 mg/dL — ABNORMAL HIGH (ref 70–99)
Glucose-Capillary: 295 mg/dL — ABNORMAL HIGH (ref 70–99)
Glucose-Capillary: 207 mg/dL — ABNORMAL HIGH (ref 70–99)

## 2022-09-19 LAB — BASIC METABOLIC PANEL
Anion gap: 8 (ref 5–15)
BUN: 46 mg/dL — ABNORMAL HIGH (ref 8–23)
CO2: 29 mmol/L (ref 22–32)
Calcium: 8.1 mg/dL — ABNORMAL LOW (ref 8.9–10.3)
Chloride: 106 mmol/L (ref 98–111)
Creatinine, Ser: 0.92 mg/dL (ref 0.44–1.00)
GFR, Estimated: >60 mL/min (ref >60)
Glucose, Bld: 155 mg/dL — ABNORMAL HIGH (ref 70–99)
Potassium: 3.6 mmol/L (ref 3.5–5.1)
Sodium: 143 mmol/L (ref 135–145)

## 2022-09-19 LAB — MAGNESIUM: Magnesium: 1.7 mg/dL (ref 1.7–2.4)

## 2022-09-19 LAB — CBC
HCT: 24.5 % — ABNORMAL LOW (ref 36.0–46.0)
Hemoglobin: 7.8 g/dL — ABNORMAL LOW (ref 12.0–15.0)
MCH: 28.7 pg (ref 26.0–34.0)
MCHC: 31.8 g/dL (ref 30.0–36.0)
MCV: 90.1 fL (ref 80.0–100.0)
Platelets: 60 10*3/uL — ABNORMAL LOW (ref 150–400)
RBC: 2.72 MIL/uL — ABNORMAL LOW (ref 3.87–5.11)
RDW: 18.1 % — ABNORMAL HIGH (ref 11.5–15.5)
WBC: 5 10*3/uL (ref 4.0–10.5)
nRBC: 2 % — ABNORMAL HIGH (ref 0.0–0.2)

## 2022-09-19 MED ORDER — POTASSIUM CHLORIDE CRYS ER 20 MEQ PO TBCR
20.0000 meq | EXTENDED_RELEASE_TABLET | Freq: Two times a day (BID) | ORAL | Status: DC
  Administered 2022-09-19 – 2022-09-21 (×4): 20 meq via ORAL
  Filled 2022-09-19 (×4): qty 1

## 2022-09-19 MED ORDER — SODIUM CHLORIDE 0.9 % IV SOLN
300.0000 mg | Freq: Once | INTRAVENOUS | Status: AC
  Administered 2022-09-19: 300 mg via INTRAVENOUS
  Filled 2022-09-19: qty 300

## 2022-09-19 NOTE — Assessment & Plan Note (Addendum)
Blood pressure little low today we will hold Lasix today
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
## 2022-09-19 NOTE — Assessment & Plan Note (Signed)
Likely secondary to lymphoma.

## 2022-09-19 NOTE — Consult Note (Signed)
Hematology/Oncology Consult note [facility] [phone] [phone]  Patient Care Team: [name], NP as PCP - General (Nurse Practitioner) [name], RN (Inactive) as Case Manager [name], RN as [facility] [employer]   Name of the patient: Natasha [name]  [id number]  02-22-1946    Reason for consult: History of nodal marginal zone lymphoma   Requesting physician: Dr.  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  He has had recurrent hospitalizations for shortness of breath requiring pleural effusion.  She was supposed to start on Bendamustine Rituxan chemotherapy but because of her declining performance status as well as repeated hospitalizations plan was made to switch her to acalabrutinib.  Patient started taking the drug on 08/31/2022.  I was called by patient's caregiver [name] on 09/17/2022 the patient was acting more lethargic.  Temperature at home was 99.6.  She had already received 2 units of blood transfusion for her anemia on 09/15/2022.  Patient reports feeling at her baseline presently.  She is on 4 L of oxygen.  She has chronic fatigue and exertional shortness of breath.  ECOG PS- 2-3  Pain scale- 0   Review of systems- Review of Systems  Constitutional:  Positive for malaise/fatigue. Negative for chills, fever and weight loss.  HENT:  Negative for congestion, ear discharge and nosebleeds.   Eyes:  Negative for blurred vision.  Respiratory:  Positive for shortness of breath. Negative for cough, hemoptysis, sputum production and wheezing.   Cardiovascular:  Negative for chest pain, palpitations, orthopnea and claudication.  Gastrointestinal:  Negative for abdominal pain, blood in stool,  constipation, diarrhea, heartburn, melena, nausea and vomiting.  Genitourinary:  Negative for dysuria, flank pain, frequency, hematuria and urgency.  Musculoskeletal:  Negative for back pain, joint pain and myalgias.  Skin:  Negative for rash.  Neurological:  Negative for dizziness, tingling, focal weakness, seizures, weakness and headaches.  Endo/Heme/Allergies:  Does not bruise/bleed easily.  Psychiatric/Behavioral:  Negative for depression and suicidal ideas. The patient does not have insomnia.     No Known Allergies  Patient Active Problem List   Diagnosis Date Noted   Symptomatic anemia 09/18/2022   COPD with acute exacerbation ([place]) 08/29/2022   Diabetes mellitus without complication ([place]) 76/72/0947   Normocytic anemia 08/29/2022   Sepsis ([place]) 08/29/2022   Chronic respiratory failure with hypoxia ([place]) 08/16/2022   Acute on chronic anemia 08/02/2022   Acute on chronic heart failure with preserved ejection fraction (HFpEF) ([place]) 08/02/2022   Marginal zone lymphoma ([place]) 07/28/2022   Swelling of lower extremity 07/24/2022   Hypotension 07/24/2022   Palliative care encounter    COVID-19 virus infection 07/14/2022   Acute on chronic diastolic CHF (congestive heart failure) ([place]) 07/14/2022   Anemia of chronic disease 07/14/2022   Thrombocytopenia ([place]) 07/14/2022   Hypothyroidism 07/14/2022   Chronic kidney disease, stage 3a ([place]) 07/14/2022   Bilateral pleural effusion 07/12/2022   Diabetes mellitus, type II ([place]) 07/12/2022   Acute on chronic respiratory failure with hypoxia ([place]) 07/12/2022   Recurrent pleural effusion 05/12/2022   Lower extremity edema 05/12/2022   Pancytopenia ([place]) 04/16/2022   Type 2 diabetes mellitus with proteinuria ([place]) 04/09/2022   Atherosclerosis of aorta ([place]) 04/09/2022   Chronic obstructive pulmonary disease (COPD) ([place]) 04/09/2022   SOB (shortness of breath)  on exertion 04/09/2022   Acute cough 04/09/2022   Cigarette nicotine dependence,  uncomplicated 47/06/2956   Post herpetic neuralgia 03/14/2020   Gall stones 10/23/2019   Lymphoma ([place]) 10/23/2019   Generalized lymphadenopathy 09/08/2018   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[place]) 09/08/2018   Primary osteoarthritis of both knees 07/07/2018   Senile purpura ([place]) 07/07/2018     Past Medical History:  Diagnosis Date   Anemia    Cancer ([place])    lymphoma-stomach    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   CHF (congestive heart failure) (HCC)    COPD (chronic obstructive pulmonary disease) (HCC)    Diabetes mellitus without complication (HCC)    Hypotension    Pleural effusion    ARMc 824m,  2 weeks ago   Vaginal delivery    x 5     Past Surgical History:  Procedure Laterality Date   IR IMAGING GUIDED PORT INSERTION  08/16/2022   TONSILLECTOMY Bilateral    as a child    Social History   Socioeconomic History   Marital status: Widowed    Spouse name: Not on file   Number of children: 2   Years of education: Not on file   Highest education level: 9th grade  Occupational History   Occupation: unemployed  Tobacco Use   Smoking status: Former    Packs/day: 2.00    Years: 55.00    Total pack years: 110.00    Types: Cigarettes    Start date: 07/07/1961    Quit date: 08/02/2022    Years since quitting: 0.1   Smokeless tobacco: Never   Tobacco comments:    1/2 pack she states but family says 2 PPD  Vaping Use   Vaping Use: Never used  Substance and Sexual Activity   Alcohol use: No   Drug use: Never   Sexual activity: Not Currently    Partners: Male    Birth control/protection: Post-menopausal  Other Topics Concern   Not on file  Social History Narrative   08/14/20   [place]  Living: to be near daughter   Work: retired      FPhysiological scientistchildren - JIT consultant(nearby) and son JEvelena Peat[place]  8 grandchildren, & 2 great-grandchildren.      Enjoys: stays at home      Exercise: walking to her daughter's store   Diet: eats fruit, air fried chicken      Safety    Seat belts: Yes    Guns: No   Safe in relationships: Yes    Social Determinants of Health   Financial Resource Strain: Medium Risk (08/24/2022)   Overall Financial Resource Strain (CARDIA)    Difficulty of Paying Living Expenses: Somewhat hard  Food Insecurity: No Food Insecurity (09/18/2022)   Hunger Vital Sign    Worried About Running Out of Food in the Last Year: Never true    Ran Out of Food in the Last Year: Never true  Recent Concern: FHeartwellPresent (08/30/2022)   Hunger Vital Sign    Worried About Running Out of Food in the Last Year: Sometimes true    Ran Out of Food in the Last Year: Sometimes true  Transportation Needs: No Transportation Needs (09/18/2022)   PRAPARE - THydrologist(Medical): No    Lack of Transportation (Non-Medical): No  Recent Concern: Transportation Needs - Unmet Transportation Needs (08/30/2022)   PRAPARE - THydrologist(Medical):  Yes    Lack of Transportation (Non-Medical): No  Physical Activity: Inactive (08/24/2022)   Exercise Vital Sign    Days of Exercise per Week: 0 days    Minutes of Exercise per Session: 0 min  Stress: Stress Concern Present (08/24/2022)   [place]    Feeling of Stress : To some extent  Social Connections: Socially Isolated (08/24/2022)   Social Connection and Isolation Panel [NHANES]    Frequency of Communication with Friends and Family: More than three times a week    Frequency of Social Gatherings with Friends and Family: More than three times a week    Attends Religious Services: Never    Marine scientist or Organizations: No    Attends Archivist Meetings: Never    Marital Status: Widowed  Intimate Partner Violence: Not At Risk (09/18/2022)   Humiliation, Afraid, Rape, and Kick questionnaire    Fear of Current or Ex-Partner: No    Emotionally  Abused: No    Physically Abused: No    Sexually Abused: No     Family History  Problem Relation Age of Onset   Breast cancer Mother    Alzheimer's disease Father    Lung cancer Father        lung   Varicose Veins Brother    Heart attack Brother      Current Facility-Administered Medications:    0.9 %  sodium chloride infusion, 10 mL/hr, Intravenous, Once, Opyd, Ilene Qua, MD, Held at 09/18/22 1016   acetaminophen (TYLENOL) tablet 650 mg, 650 mg, Oral, Q6H PRN **OR** acetaminophen (TYLENOL) suppository 650 mg, 650 mg, Rectal, Q6H PRN, Opyd, Ilene Qua, MD   acyclovir (ZOVIRAX) 200 MG capsule 400 mg, 400 mg, Oral, Daily, Opyd, Ilene Qua, MD, 400 mg at 09/19/22 0817   albuterol (PROVENTIL) (2.5 MG/3ML) 0.083% nebulizer solution 2.5 mg, 2.5 mg, Inhalation, Q6H PRN, Opyd, Ilene Qua, MD   furosemide (LASIX) injection 40 mg, 40 mg, Intravenous, BID, Opyd, Ilene Qua, MD, 40 mg at 09/19/22 0816   insulin aspart (novoLOG) injection 0-5 Units, 0-5 Units, Subcutaneous, QHS, Opyd, Ilene Qua, MD, 2 Units at 09/18/22 2100   insulin aspart (novoLOG) injection 0-6 Units, 0-6 Units, Subcutaneous, TID WC, Opyd, Ilene Qua, MD, 1 Units at 09/19/22 0816   ipratropium-albuterol (DUONEB) 0.5-2.5 (3) MG/3ML nebulizer solution 3 mL, 3 mL, Inhalation, TID, Opyd, Ilene Qua, MD, 3 mL at 09/18/22 1516   iron sucrose (VENOFER) 300 mg in sodium chloride 0.9 % 250 mL IVPB, 300 mg, Intravenous, Once, Loletha Grayer, MD   levothyroxine (SYNTHROID) tablet 50 mcg, 50 mcg, Oral, Q0600, Opyd, Ilene Qua, MD, 50 mcg at 09/19/22 6269   oxyCODONE (Oxy IR/ROXICODONE) immediate release tablet 5 mg, 5 mg, Oral, Q4H PRN, Opyd, Ilene Qua, MD   senna-docusate (Senokot-S) tablet 1 tablet, 1 tablet, Oral, QHS PRN, Opyd, Ilene Qua, MD   sodium chloride flush (NS) 0.9 % injection 3 mL, 3 mL, Intravenous, Q12H, Opyd, Ilene Qua, MD, 3 mL at 09/19/22 0818   Physical exam:  Vitals:   09/18/22 1937 09/19/22 0032 09/19/22 0626 09/19/22  0742  BP: 100/61 (!) 100/57 (!) 94/40 (!) 89/58  Pulse: 98 92 90 91  Resp: _0 Temp: 99.4 F (37.4 C) 99.1 F (37.3 C) 98.7 F (37.1 C) 98.6 F (37 C)  TempSrc:      SpO2: 94% 90% 90% 91%  Weight: 162 lb 4.1 oz (73.6 kg)  Height: _0  (1.651 m)      Physical Exam Constitutional:      General: She is not in acute distress.    Comments: On 4 L of oxygen  Cardiovascular:     Rate and Rhythm: Normal rate and regular rhythm.     Heart sounds: Normal heart sounds.  Pulmonary:     Effort: Pulmonary effort is normal.     Comments: Breath sounds decreased bilaterally over bases Abdominal:     General: Bowel sounds are normal.     Palpations: Abdomen is soft.     Comments: Palpable splenomegaly  Lymphadenopathy:     Comments: Palpable bilateral axillary adenopathy  Skin:    General: Skin is warm and dry.  Neurological:     Mental Status: She is alert and oriented to person, place, and time.  Psychiatric:        Mood and Affect: Mood normal.           Latest Ref Rng & Units 09/19/2022    4:10 AM  CMP  Glucose 70 - 99 mg/dL 155   BUN 8 - 23 mg/dL 46   Creatinine 0.44 - 1.00 mg/dL 0.92   Sodium 135 - 145 mmol/L 143   Potassium 3.5 - 5.1 mmol/L 3.6   Chloride 98 - 111 mmol/L 106   CO2 22 - 32 mmol/L 29   Calcium 8.9 - 10.3 mg/dL 8.1       Latest Ref Rng & Units 09/19/2022    4:10 AM  CBC  WBC 4.0 - 10.5 K/uL 5.0   Hemoglobin 12.0 - 15.0 g/dL 7.8   Hematocrit 36.0 - 46.0 % 24.5   Platelets 150 - 400 K/uL 60     _1 @  DG Chest 2 View  Result Date: 09/17/2022 CLINICAL DATA:  Shortness of breath EXAM: CHEST - 2 VIEW COMPARISON:  08/30/2022 FINDINGS: Cardiac shadow is within normal limits. Right chest wall port is seen and stable. Bilateral pleural effusions are noted right considerably greater than left increased from the prior exam. Changes of central vascular congestion are noted consistent with CHF. IMPRESSION: Vascular congestion with increasing  pleural effusions right greater than left. Electronically Signed   By: Inez Catalina M.D.   On: 09/17/2022 23:16   CT HEAD WO CONTRAST (5MM)  Result Date: 09/11/2022 CLINICAL DATA:  Headache, new onset. EXAM: CT HEAD WITHOUT CONTRAST TECHNIQUE: Contiguous axial images were obtained from the base of the skull through the vertex without intravenous contrast. RADIATION DOSE REDUCTION: This exam was performed according to the departmental dose-optimization program which includes automated exposure control, adjustment of the mA and/or kV according to patient size and/or use of iterative reconstruction technique. COMPARISON:  CT without contrast 07/12/2022 FINDINGS: Brain: No acute infarct, hemorrhage, or mass lesion is present. Mild atrophy and white matter changes are stable. The ventricles are of normal size. No significant extraaxial fluid collection is present. The brainstem and cerebellum are within normal limits. Vascular: No hyperdense vessel or unexpected calcification. Skull: Calvarium is intact. No focal lytic or blastic lesions are present. No significant extracranial soft tissue lesion is present. Sinuses/Orbits: The paranasal sinuses and mastoid air cells are clear. The globes and orbits are within normal limits. Other: Asymmetric enlargement of the parotid gland, right greater than left is similar the prior study. This again raises concern for lymphomatous involvement. IMPRESSION: 1. No acute intracranial abnormality or significant interval change. 2. Stable mild atrophy and white matter disease. This likely reflects the sequela of  chronic microvascular ischemia. 3. Asymmetric enlargement of the parotid gland, right greater than left is similar the prior study. This again raises concern for lymphomatous involvement. Electronically Signed   By: San Morelle M.D.   On: 09/11/2022 12:33   DG Chest Port 1 View  Result Date: 08/30/2022 CLINICAL DATA:  Post thoracentesis.  RIGHT. EXAM: PORTABLE  CHEST 1 VIEW COMPARISON:  IR ultrasound, earlier same day. Chest XR, 08/29/2022. FINDINGS: Support lines: Stable positioning of RIGHT chest port with catheter tip at the superior cavoatrial junction. Overlying pacer leads. Cardiac apex is obscured. Mediastinal silhouette is within normal limits. Lungs are hypoinflated with perihilar opacities and bibasilar consolidations. Interval removal of RIGHT pleural effusion, with small volume residual. Small-to-moderate volume LEFT pleural effusion. No pneumothorax. No interval osseous abnormality. IMPRESSION: 1. No pneumothorax status post RIGHT thoracentesis. 2. Bilateral pleural effusions and basilar consolidations, likely atelectasis. Electronically Signed   By: Michaelle Birks M.D.   On: 08/30/2022 12:29   US THORACENTESIS ASP PLEURAL SPACE W/IMG GUIDE  Result Date: 08/30/2022 INDICATION: History of gastric lymphoma with recurrent pleural effusions. Patient presented to ED with complaint of shortness of breath found to have bilateral pleural effusions. Upon ultrasound exam right greater than left. EXAM: ULTRASOUND GUIDED DIAGNOSTIC AND THERAPEUTIC RIGHT THORACENTESIS MEDICATIONS: 10 mL 1 % lidocaine COMPLICATIONS: None immediate. PROCEDURE: An ultrasound guided thoracentesis was thoroughly discussed with the patient and questions answered. The benefits, risks, alternatives and complications were also discussed. The patient understands and wishes to proceed with the procedure. Written consent was obtained. Ultrasound was performed to localize and mark an adequate pocket of fluid in the RIGHT chest. The area was then prepped and draped in the normal sterile fashion. 1% Lidocaine was used for local anesthesia. Under ultrasound guidance a 6 Fr Safe-T-Centesis catheter was introduced. Thoracentesis was performed. The catheter was removed and a dressing applied. FINDINGS: A total of approximately 1.2 L of hazy, amber colored fluid was removed. Samples were sent to the  laboratory as requested by the clinical team. IMPRESSION: Successful ultrasound guided right thoracentesis yielding 1.2 L of pleural fluid. Read by: Narda Rutherford, AGNP-BC Electronically Signed   By: Michaelle Birks M.D.   On: 08/30/2022 12:25   DG Chest Portable 1 View  Result Date: 08/29/2022 CLINICAL DATA:  Shortness of breath. EXAM: PORTABLE CHEST 1 VIEW COMPARISON:  08/17/2022 FINDINGS: Moderate to large-sized bilateral pleural effusions, significantly increased. Progressive increase in prominence of the pulmonary vasculature and interstitial markings. Less airspace opacity and volume loss at the lung bases. Stable right jugular porta catheter with its tip in the region of the superior cavoatrial junction or upper right atrium. Unremarkable bones. IMPRESSION: 1. Moderate to large-sized bilateral pleural effusions, significantly increased. 2. Progressive changes of congestive heart failure. 3. Mildly improved bibasilar atelectasis. Electronically Signed   By: Claudie Revering M.D.   On: 08/29/2022 14:17    Assessment and plan- Patient is a 76 y.o. female with history of nodal stage IV marginal zone lymphoma admitted for severe anemia  Despite receiving 2 units of PRBC transfusion on 09/16/2022 patient's hemoglobin did not improve and has remained at 6.  She has received 2 more units of blood transfusion during this hospitalization.  Hemoglobin improved to 7.9.  Patient has baseline neutropenia and thrombocytopenia likely secondary to bone marrow involvement from her stage IV lymphoma although patient has not had bone marrow biopsy done yet because of her hesitancy to go through procedures in general.  We have not been able to get  a PET scan scheduled all this time due to issues with transportation and repeated cancellations.  If patient's hemoglobin drops again during this hospitalization recommend getting a CT abdomen to rule out retroperitoneal bleed.  Overall her neutropenia seems to be improving although  she is still significantly anemic and thrombocytopenic.  Patient has known lymphoma involvement in her pleural effusion and may need another thoracentesis during this hospitalization.  Continue acalabrutinib while she is admitted.  Visit Diagnosis 1. Symptomatic anemia   2. Acute on chronic congestive heart failure, unspecified heart failure type ([place])   3. Acute on chronic heart failure with preserved ejection fraction (HFpEF) ([place])     Dr. Randa Evens, MD, MPH [facility] [phone] 09/19/2022

## 2022-09-19 NOTE — Assessment & Plan Note (Signed)
Continue Synthroid °

## 2022-09-19 NOTE — Progress Notes (Signed)
Progress Note   Patient: Natasha [name] YIR:[id number] DOB: January 12, 1946 DOA: 09/18/2022     1 DOS: the patient was seen and examined on 09/19/2022   Brief hospital course: Annamarie Yamaguchi Arviso is a 76 y.o. 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   Patient reports insidiously worsening shortness of breath, fatigue, and leg swelling over recent days to weeks.  She has nonproductive cough but denies fever or chest pain.  She denies any melena, hematochezia, or other signs of bleeding.     She was hospitalized earlier this month with acute on chronic hypoxic respiratory failure and underwent treatment for acute CHF, COPD exacerbation, and had thoracentesis performed.  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     Since the recent hospitalization, she was started on acalabrutinib and had received blood transfusions; most recently, she was transfused 2 units of RBC on 09/14/2022.   ED Course: Upon arrival to the ED, patient is found to be afebrile and saturating well on her usual 4 L/min of supplemental oxygen.  Chest x-ray notable for vascular congestion and increased pleural effusions, right greater than left.  Blood work notable for hemoglobin 6.0, platelets 65,000, BUN 50, BNP 151, and troponin normal x 2.   Patient was given 40 mg IV Lasix in the ED and 2 units of RBC were ordered for transfusion  Assessment and Plan: Iron deficiency anemia IV Venofer today.  Continue to monitor blood counts.  We will get a CT scan to rule out retroperitoneal bleed.  Acute on chronic heart failure with preserved ejection fraction (HFpEF) ([place]) Patient on Lasix 40 mg twice a day.  Continue to monitor.  Recurrent pleural  effusion Right greater than left.  Thoracentesis ordered for tomorrow.  Marginal zone lymphoma (HCC) Recently started on acalabrutinib  Hypothyroidism Continue Synthroid  Thrombocytopenia ([place]) Likely secondary to lymphoma.  Chronic respiratory failure with hypoxia (HCC) Chronically wears oxygen.  Chronic obstructive pulmonary disease (COPD) (HCC) Continue inhalers.        Subjective: Patient upset about her blood counts being low.  Feels a little short of breath.  Interested in a thoracentesis tomorrow.  No signs of bleeding.  Physical Exam: Vitals:   09/19/22 0032 09/19/22 0626 09/19/22 0742 09/19/22 1156  BP: (!) 100/57 (!) 94/40 (!) 89/58 101/67  Pulse: 92 90 91 97  Resp: '16 18 20 20  '$ Temp: 99.1 F (37.3 C) 98.7 F (37.1 C) 98.6 F (37 C)   TempSrc:      SpO2: 90% 90% 91% (!) 86%  Weight:      Height:       Physical Exam HENT:     Head: Normocephalic.     Mouth/Throat:     Pharynx: No oropharyngeal exudate.  Eyes:     General: Lids are normal.     Conjunctiva/sclera: Conjunctivae normal.  Cardiovascular:     Rate and Rhythm: Normal rate and regular rhythm.     Heart sounds: Normal heart sounds, S1 normal and S2 normal.  Pulmonary:     Breath sounds: Examination of the right-lower field reveals decreased breath sounds. Examination of the left-lower field reveals decreased breath sounds. Decreased breath sounds present. No wheezing, rhonchi or rales.  Abdominal:     Palpations: Abdomen is soft.  Tenderness: There is no abdominal tenderness.  Musculoskeletal:     Right lower leg: Swelling present.     Left lower leg: Swelling present.  Skin:    General: Skin is warm.     Findings: No rash.  Neurological:     Mental Status: She is alert and oriented to person, place, and time.     Data Reviewed: Last hemoglobin 7.8, last platelet count 60, creatinine 0.92, ferritin 64  Family Communication I updated patient's daughter on the  phone.  Disposition: Status is: Inpatient Remains inpatient appropriate because: Received 2 units of packed red blood cells as outpatient and another 2 units here.  Hemoglobin only 7.8.  We will get a CT scan of the abdomen to rule out retroperitoneal bleed.  Planned Discharge Destination: Home    Time spent: 28 minutes  Author: Loletha Grayer, MD 09/19/2022 2:28 PM  For on call review [email or web address].

## 2022-09-19 NOTE — Assessment & Plan Note (Addendum)
Right greater than left.  Thoracentesis removed 1.2 L underneath the right lung

## 2022-09-19 NOTE — Assessment & Plan Note (Addendum)
Recently started on acalabrutinib.  I asked daughter to bring in this medication because we do not have it in stock.

## 2022-09-19 NOTE — Hospital Course (Addendum)
Natasha [name] is a 76 y.o. 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   Patient reports insidiously worsening shortness of breath, fatigue, and leg swelling over recent days to weeks.  She has nonproductive cough but denies fever or chest pain.  She denies any melena, hematochezia, or other signs of bleeding.     She was hospitalized earlier this month with acute on chronic hypoxic respiratory failure and underwent treatment for acute CHF, COPD exacerbation, and had thoracentesis performed.  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     Since the recent hospitalization, she was started on acalabrutinib and had received blood transfusions; most recently, she was transfused 2 units of RBC on 09/14/2022.   ED Course: Upon arrival to the ED, patient is found to be afebrile and saturating well on her usual 4 L/min of supplemental oxygen.  Chest x-ray notable for vascular congestion and increased pleural effusions, right greater than left.  Blood work notable for hemoglobin 6.0, platelets 65,000, BUN 50, BNP 151, and troponin normal x 2.   Patient was given 40 mg IV Lasix in the ED and 2 units of RBC were ordered for transfusion.  Patient was given IV Venofer on 09/19/2022 Patient had a thoracentesis and removed 1.2 L underneath the right lung.  11/28.  Notified that unable to send patient home today because of the social issue.  Transitional care team working to make sure there is a safe disposition.

## 2022-09-19 NOTE — Assessment & Plan Note (Signed)
Continue inhalers

## 2022-09-19 NOTE — Assessment & Plan Note (Signed)
Chronically wears oxygen.

## 2022-09-19 NOTE — TOC Initial Note (Signed)
Transition of Care [facility]) - Initial/Assessment Note    Patient Details  Name: Natasha [name] MRN: [id number] Date of Birth: Jan 28, 1946  Transition of Care [facility]) CM/SW Contact:    Eileen Stanford, LCSW Phone Number: 09/19/2022, 3:59 PM  Clinical Narrative:    CSW spoke with pt in regards to dc plan. Pt is agreeable to [facility] with no agency preference. Pt states she does not have a walker at home and will need one.   [name] will service pt.  Walker ordered through [place].               Expected Discharge Plan: [place] Barriers to Discharge: Continued Medical Work up   Patient Goals and CMS Choice Patient states their goals for this hospitalization and ongoing recovery are:: to get better   Choice offered to / list presented to : Patient  Expected Discharge Plan and Services Expected Discharge Plan: [place] In-house Referral: Clinical Social Work   Post Acute Care Choice: [place] arrangements for the past 2 months: [place]                 DME Arranged: Environmental consultant DME Agency: [employer]       HH Arranged: PT, OT [place] Agency: [place] Date [place]: 09/19/22 Time HH Agency Contacted: 20 Representative spoke with at [place]: Tommi Rumps  Prior Living Arrangements/Services Living arrangements for the past 2 months: [place] with:: Self Patient language and need for interpreter reviewed:: Yes Do you feel safe going back to the place where you live?: Yes      Need for Family Participation in Patient Care: Yes (Comment) Care giver support system in place?: Yes (comment)   Criminal Activity/Legal Involvement Pertinent to Current Situation/Hospitalization: No - Comment as needed  Activities of Daily Living Home Assistive Devices/Equipment: Nebulizer, Oxygen ADL Screening (condition at time of admission) Patient's cognitive ability adequate to safely complete daily activities?: Yes Is  the patient deaf or have difficulty hearing?: No Does the patient have difficulty seeing, even when wearing glasses/contacts?: No Does the patient have difficulty concentrating, remembering, or making decisions?: No Patient able to express need for assistance with ADLs?: Yes Does the patient have difficulty dressing or bathing?: Yes Independently performs ADLs?: No Communication: Independent Dressing (OT): Needs assistance Grooming: Independent Feeding: Independent Bathing: Needs assistance Is this a change from baseline?: Change from baseline, expected to last <3 days Toileting: Needs assistance Is this a change from baseline?:  (no) Does the patient have difficulty walking or climbing stairs?: No Weakness of Legs: Both Weakness of Arms/Hands: None  Permission Sought/Granted Permission sought to share information with : Family Supports Permission granted to share information with : Yes, Verbal Permission Granted  Share Information with NAME: jamie     Permission granted to share info w Relationship: daughter     Emotional Assessment Appearance:: Appears stated age Attitude/Demeanor/Rapport: Engaged Affect (typically observed): Accepting Orientation: : Oriented to Place, Oriented to Self, Oriented to  Time, Oriented to Situation Alcohol / Substance Use: Not Applicable Psych Involvement: No (comment)  Admission diagnosis:  Symptomatic anemia [D64.9] Acute on chronic congestive heart failure, unspecified heart failure type ([place]) [I50.9] Acute on chronic heart failure with preserved ejection fraction (HFpEF) ([place]) [I50.33] Patient Active Problem List   Diagnosis Date Noted   Iron deficiency anemia 09/19/2022   COPD with acute exacerbation ([place]) 08/29/2022   Diabetes mellitus without complication ([place]) 38/88/2800  Normocytic anemia 08/29/2022   Sepsis ([place]) 08/29/2022   Chronic respiratory failure with hypoxia ([place]) 08/16/2022   Acute on chronic anemia 08/02/2022   Acute on  chronic heart failure with preserved ejection fraction (HFpEF) ([place]) 08/02/2022   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   Swelling of lower extremity 07/24/2022   Hypotension 07/24/2022   Palliative care encounter    COVID-19 virus infection 07/14/2022   Acute on chronic diastolic CHF (congestive heart failure) ([place]) 07/14/2022   Anemia of chronic disease 07/14/2022   Thrombocytopenia ([place]) 07/14/2022   Hypothyroidism 07/14/2022   Chronic kidney disease, stage 3a ([place]) 07/14/2022   Bilateral pleural effusion 07/12/2022   Diabetes mellitus, type II ([place]) 07/12/2022   Acute on chronic respiratory failure with hypoxia ([place]) 07/12/2022   Recurrent pleural effusion 05/12/2022   Lower extremity edema 05/12/2022   Pancytopenia ([place]) 04/16/2022   Type 2 diabetes mellitus with proteinuria ([place]) 04/09/2022   Atherosclerosis of aorta ([place]) 04/09/2022   Chronic obstructive pulmonary disease (COPD) ([place]) 04/09/2022   SOB (shortness of breath) on exertion 04/09/2022   Acute cough 04/09/2022   Cigarette nicotine dependence, uncomplicated 07/68/0881   Post herpetic neuralgia 03/14/2020   Gall stones 10/23/2019   Lymphoma ([place]) 10/23/2019   Generalized lymphadenopathy 09/08/2018   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   Primary osteoarthritis of both knees 07/07/2018   Senile purpura ([place]) 07/07/2018   PCP:  [name], NP Pharmacy:  No Pharmacies Listed    Social Determinants of Health (SDOH) Interventions Food Insecurity Interventions: Intervention Not Indicated  Readmission Risk Interventions    08/17/2022   11:05 AM 08/04/2022    2:54 PM  Readmission Risk Prevention Plan  Transportation Screening Complete Complete  PCP or Specialist Appt within 5-7 Days  Complete  PCP or Specialist Appt within 3-5 Days Complete   Home Care Screening  Complete  Medication Review (RN CM)  Complete  HRI or Home Care Consult Complete   Social Work Consult for [place]  Planning/Counseling Complete   Palliative Care Screening Not Applicable   Medication Review Press photographer) Complete

## 2022-09-19 NOTE — Assessment & Plan Note (Addendum)
Received 2 units of packed red blood cells during this hospital course.  IV Venofer given on 11/26.  Continue to monitor blood counts.  Hemoglobin 7.8 today.  CT scan of the abdomen negative for retroperitoneal bleed.

## 2022-09-20 ENCOUNTER — Inpatient Hospital Stay: Payer: Medicare Other

## 2022-09-20 ENCOUNTER — Other Ambulatory Visit: Payer: Medicare Other

## 2022-09-20 DIAGNOSIS — I509 Heart failure, unspecified: Secondary | ICD-10-CM

## 2022-09-20 DIAGNOSIS — I5033 Acute on chronic diastolic (congestive) heart failure: Secondary | ICD-10-CM | POA: Diagnosis not present

## 2022-09-20 DIAGNOSIS — D509 Iron deficiency anemia, unspecified: Secondary | ICD-10-CM | POA: Diagnosis not present

## 2022-09-20 DIAGNOSIS — J90 Pleural effusion, not elsewhere classified: Secondary | ICD-10-CM | POA: Diagnosis not present

## 2022-09-20 DIAGNOSIS — Z9889 Other specified postprocedural states: Secondary | ICD-10-CM

## 2022-09-20 DIAGNOSIS — D649 Anemia, unspecified: Secondary | ICD-10-CM

## 2022-09-20 DIAGNOSIS — R899 Unspecified abnormal finding in specimens from other organs, systems and tissues: Secondary | ICD-10-CM

## 2022-09-20 DIAGNOSIS — C8580 Other specified types of non-Hodgkin lymphoma, unspecified site: Secondary | ICD-10-CM | POA: Diagnosis not present

## 2022-09-20 DIAGNOSIS — R509 Fever, unspecified: Secondary | ICD-10-CM | POA: Diagnosis not present

## 2022-09-20 LAB — CBC
HCT: 26.8 % — ABNORMAL LOW (ref 36.0–46.0)
Hemoglobin: 8.4 g/dL — ABNORMAL LOW (ref 12.0–15.0)
MCH: 28.7 pg (ref 26.0–34.0)
MCHC: 31.3 g/dL (ref 30.0–36.0)
MCV: 91.5 fL (ref 80.0–100.0)
Platelets: 63 10*3/uL — ABNORMAL LOW (ref 150–400)
RBC: 2.93 MIL/uL — ABNORMAL LOW (ref 3.87–5.11)
RDW: 18.4 % — ABNORMAL HIGH (ref 11.5–15.5)
WBC: 5.1 10*3/uL (ref 4.0–10.5)
nRBC: 1.8 % — ABNORMAL HIGH (ref 0.0–0.2)

## 2022-09-20 LAB — BODY FLUID CELL COUNT WITH DIFFERENTIAL
Eos, Fluid: 0 %
Lymphs, Fluid: 83 %
Monocyte-Macrophage-Serous Fluid: 14 %
Neutrophil Count, Fluid: 3 %
Total Nucleated Cell Count, Fluid: 1166 cu mm

## 2022-09-20 LAB — COMP PANEL: LEUKEMIA/LYMPHOMA: Immunophenotypic Profile: 41

## 2022-09-20 LAB — PROTEIN, PLEURAL OR PERITONEAL FLUID: Total protein, fluid: <3 g/dL

## 2022-09-20 LAB — GLUCOSE, CAPILLARY
Glucose-Capillary: 233 mg/dL — ABNORMAL HIGH (ref 70–99)
Glucose-Capillary: 220 mg/dL — ABNORMAL HIGH (ref 70–99)
Glucose-Capillary: 251 mg/dL — ABNORMAL HIGH (ref 70–99)
Glucose-Capillary: 140 mg/dL — ABNORMAL HIGH (ref 70–99)

## 2022-09-20 LAB — BASIC METABOLIC PANEL
Anion gap: 8 (ref 5–15)
BUN: 46 mg/dL — ABNORMAL HIGH (ref 8–23)
CO2: 30 mmol/L (ref 22–32)
Calcium: 8 mg/dL — ABNORMAL LOW (ref 8.9–10.3)
Chloride: 103 mmol/L (ref 98–111)
Creatinine, Ser: 1.03 mg/dL — ABNORMAL HIGH (ref 0.44–1.00)
GFR, Estimated: 57 mL/min — ABNORMAL LOW (ref >60)
Glucose, Bld: 157 mg/dL — ABNORMAL HIGH (ref 70–99)
Potassium: 3.7 mmol/L (ref 3.5–5.1)
Sodium: 141 mmol/L (ref 135–145)

## 2022-09-20 LAB — LACTATE DEHYDROGENASE, PLEURAL OR PERITONEAL FLUID: LD, Fluid: 97 U/L — ABNORMAL HIGH (ref 3–23)

## 2022-09-20 LAB — PATHOLOGIST SMEAR REVIEW

## 2022-09-20 LAB — PROTEIN, TOTAL: Total Protein: 4.8 g/dL — ABNORMAL LOW (ref 6.5–8.1)

## 2022-09-20 LAB — LACTATE DEHYDROGENASE: LDH: 229 U/L — ABNORMAL HIGH (ref 98–192)

## 2022-09-20 MED ORDER — LIDOCAINE HCL (PF) 1 % IJ SOLN
10.0000 mL | Freq: Once | INTRAMUSCULAR | Status: AC
  Administered 2022-09-20: 10 mL via INTRADERMAL

## 2022-09-20 MED ORDER — FUROSEMIDE 40 MG PO TABS: 40.0000 mg | ORAL_TABLET | Freq: Two times a day (BID) | ORAL | Status: DC

## 2022-09-20 MED ORDER — FUROSEMIDE 40 MG PO TABS: 40.0000 mg | ORAL_TABLET | Freq: Every day | ORAL | Status: DC

## 2022-09-20 NOTE — Inpatient Diabetes Management (Signed)
Inpatient Diabetes Program Recommendations  AACE/ADA: New Consensus Statement on Inpatient Glycemic Control   Target Ranges:  Prepandial:   less than 140 mg/dL      Peak postprandial:   less than 180 mg/dL (1-2 hours)      Critically ill patients:  140 - 180 mg/dL    Latest Reference Range & Units 09/19/22 07:44 09/19/22 11:57 09/19/22 16:41 09/19/22 20:25 09/20/22 07:59  Glucose-Capillary 70 - 99 mg/dL 190 (H) 301 (H) 295 (H) 207 (H) 233 (H)   Review of Glycemic Control  Diabetes history: DM2 Outpatient Diabetes medications: 70/30 10 units BID, Regular 5-10 units TID with meals Current orders for Inpatient glycemic control: Novolog 0-6 units TID with meals, Novolog 0-5 units QHS  Inpatient Diabetes Program Recommendations:    Insulin: Please consider ordering Novolog 3 units TID with meals for meal coverage if patient eats at least 50% of meals.  Thanks, Barnie Alderman, RN, MSN, [place] Diabetes Coordinator Inpatient Diabetes Program [phone] (Team Pager from 8am to [place])

## 2022-09-20 NOTE — Progress Notes (Signed)
Physical Therapy Treatment Patient Details Name: Natasha [name] MRN: [id number] DOB: 09-05-1946 Today's Date: 09/20/2022   History of Present Illness Patient is a 76 year old female with medical history significant for COPD, chronic hypoxic respiratory failure, chronic HFpEF, type 2 diabetes mellitus, hypothyroidism, chronic anemia and thrombocytopenia, and marginal zone lymphoma who presents to the emergency department with increased shortness of breath, leg swelling, and fatigue.    PT Comments    Patient is agreeable to PT session. She had a thoracentesis earlier with follow up chest X-ray. She reports feeling much better today. She is still fatigued with activity and cues provided for energy conservation techniques. She was able to ambulate a short distance with the rolling walker with mild RLE pain reported. Sp02 was 94% on 4 L02 after walking. The patient is hopeful for discharge home tomorrow. PT will continue to follow to maximize independence and decrease caregiver burden.    Recommendations for follow up therapy are one component of a multi-disciplinary discharge planning process, led by the attending physician.  Recommendations may be updated based on patient status, additional functional criteria and insurance authorization.  Follow Up Recommendations  Home health PT     Assistance Recommended at Discharge Set up Supervision/Assistance  Patient can return home with the following A little help with bathing/dressing/bathroom;Assist for transportation;Help with stairs or ramp for entrance;Assistance with cooking/housework   Equipment Recommendations  Rolling walker (2 wheels)    Recommendations for Other Services       Precautions / Restrictions Precautions Precautions: Fall Restrictions Weight Bearing Restrictions: No     Mobility  Bed Mobility Overal bed mobility: Needs Assistance Bed Mobility: Supine to Sit, Sit to Supine     Supine to sit: Min guard Sit to  supine: Min guard   General bed mobility comments: increased time and effort required    Transfers Overall transfer level: Needs assistance Equipment used: None Transfers: Sit to/from Stand Sit to Stand: Min guard           General transfer comment: cues for safety. no increased shortness of breath noted with activity. mild right leg pain reported with standing    Ambulation/Gait Ambulation/Gait assistance: Min guard Gait Distance (Feet): 10 Feet Assistive device: Rolling walker (2 wheels) Gait Pattern/deviations: Decreased stride length Gait velocity: decreased     General Gait Details: patient ambulated a short distance in the room with verbal cues for safety with using rolling walker. Sp02 94% on 4 L02 after walking   Stairs             Wheelchair Mobility    Modified Rankin (Stroke Patients Only)       Balance                                            Cognition Arousal/Alertness: Awake/alert Behavior During Therapy: WFL for tasks assessed/performed Overall Cognitive Status: Within Functional Limits for tasks assessed                                          Exercises      General Comments General comments (skin integrity, edema, etc.): encouraged patient to use energy conservation techniques in her home setting and she verbalized understanding      Pertinent Vitals/Pain Pain Assessment Pain  Assessment: Faces Faces Pain Scale: Hurts little more Pain Location: distal RLE Pain Descriptors / Indicators: Discomfort Pain Intervention(s): Limited activity within patient's tolerance    Home Living                          Prior Function            PT Goals (current goals can now be found in the care plan section) Acute Rehab PT Goals Patient Stated Goal: to go home PT Goal Formulation: With patient Time For Goal Achievement: 10/03/22 Potential to Achieve Goals: Good Progress towards PT goals:  Progressing toward goals    Frequency    Min 2X/week      PT Plan Current plan remains appropriate    Co-evaluation              AM-PAC PT "6 Clicks" Mobility   Outcome Measure  Help needed turning from your back to your side while in a flat bed without using bedrails?: None Help needed moving from lying on your back to sitting on the side of a flat bed without using bedrails?: A Little Help needed moving to and from a bed to a chair (including a wheelchair)?: A Little Help needed standing up from a chair using your arms (e.g., wheelchair or bedside chair)?: A Little Help needed to walk in hospital room?: A Little Help needed climbing 3-5 steps with a railing? : A Lot 6 Click Score: 18    End of Session Equipment Utilized During Treatment: Oxygen Activity Tolerance: Patient tolerated treatment well Patient left: in bed;with call bell/phone within reach;with bed alarm set Nurse Communication: Mobility status PT Visit Diagnosis: Unsteadiness on feet (R26.81);Muscle weakness (generalized) (M62.81)     Time: 5465-6812 PT Time Calculation (min) (ACUTE ONLY): 15 min  Charges:  $Therapeutic Activity: 8-22 mins                     Minna Merritts, PT, MPT   Percell Locus 09/20/2022, 3:37 PM

## 2022-09-20 NOTE — Consult Note (Addendum)
   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  Mildly elevated pulmonary artery systolic pressures.  Mild mitral regurgitation.  Mild to moderate tricuspid regurgitation.  He presented from home to the emergency room by way of EMS for complaints of worsening shortness of breath increasing lower extremity edema and fatigue.  BNP 151  Comorbidities:  COPD Type 2 diabetes Hypothyroidism Chronic anemia Thrombocytopenia Marginal zone lymphoma  Medications:  Furosemide 40 mg IV 2 times a day Levothyroxine 50 mcg daily Potassium chloride 20 mEq 2 times a day  Labs:  Sodium 141, potassium 3.7, chloride 103, CO2 30, creatinine 1.03, BUN 46, estimated GFR 57. Weight is 72.8 kg Intake 360 mL Output 2401 mL Blood pressure 95/54  Initial meeting with patient on this admission.  There were no family members or support at the bedside.  She states that she had been weighing herself but not daily she noted that her weight went from 140-145.  She states that she was compliant with the Lasix at home, but really does not like taking it.  Went over fluid restriction, she continues to drink her Cokes along with what she felt to be 16 ounces of water daily.  Session was cut short as staff came to get her to take her for her thoracentesis.  She has follow-up in the outpatient heart failure clinic on December 4 at 11:30 [place]

## 2022-09-20 NOTE — Discharge Instructions (Signed)

## 2022-09-20 NOTE — Progress Notes (Signed)
PT Cancellation Note  Patient Details Name: Natasha [name] MRN: [id number] DOB: [birth year]   Cancelled Treatment:    Reason Eval/Treat Not Completed: Patient at procedure or test/unavailable. PT to continue with attempts as appropriate.   Minna Merritts, PT, MPT  Percell Locus 09/20/2022, 11:00 AM

## 2022-09-20 NOTE — Progress Notes (Signed)
Progress Note   Patient: Natasha [name] WFU:[id number] DOB: 10/14/1946 DOA: 09/18/2022     2 DOS: the patient was seen and examined on 09/20/2022   Brief hospital course: Alishia Lebo Erny is a 76 y.o. 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   Patient reports insidiously worsening shortness of breath, fatigue, and leg swelling over recent days to weeks.  She has nonproductive cough but denies fever or chest pain.  She denies any melena, hematochezia, or other signs of bleeding.     She was hospitalized earlier this month with acute on chronic hypoxic respiratory failure and underwent treatment for acute CHF, COPD exacerbation, and had thoracentesis performed.  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     Since the recent hospitalization, she was started on acalabrutinib and had received blood transfusions; most recently, she was transfused 2 units of RBC on 09/14/2022.   ED Course: Upon arrival to the ED, patient is found to be afebrile and saturating well on her usual 4 L/min of supplemental oxygen.  Chest x-ray notable for vascular congestion and increased pleural effusions, right greater than left.  Blood work notable for hemoglobin 6.0, platelets 65,000, BUN 50, BNP 151, and troponin normal x 2.   Patient was given 40 mg IV Lasix in the ED and 2 units of RBC were ordered for transfusion.  Patient was given IV Venofer on 09/19/2022 Patient had a thoracentesis and removed 1.2 L underneath the right lung.  Assessment and Plan: Iron deficiency anemia IV Venofer given yesterday.  Continue to monitor blood counts.  Hemoglobin up to 8.4 today.  CT scan of the abdomen negative for retroperitoneal  bleed.  Acute on chronic heart failure with preserved ejection fraction (HFpEF) (HCC) Blood pressure little low today we will hold Lasix this afternoon and restart again tomorrow.  Recurrent pleural effusion Right greater than left.  Thoracentesis removed 1.2 L underneath the right lung  Marginal zone lymphoma (HCC) Recently started on acalabrutinib  Hypothyroidism Continue Synthroid  Thrombocytopenia ([place]) Likely secondary to lymphoma.  Chronic respiratory failure with hypoxia (HCC) Chronically wears oxygen.  Chronic obstructive pulmonary disease (COPD) (HCC) Continue inhalers.        Subjective: Patient seen this morning prior to thoracentesis and she was feeling a little bit better.  Admitted with anemia and recurrent pleural effusion.  Physical Exam: Vitals:   09/20/22 0752 09/20/22 1046 09/20/22 1133 09/20/22 1338  BP: 102/60 (!) 84/49 (!) 95/54   Pulse:  95 93   Resp:      Temp:      TempSrc:      SpO2: 98% 96% 96% 90%  Weight:      Height:       Physical Exam HENT:     Head: Normocephalic.     Mouth/Throat:     Pharynx: No oropharyngeal exudate.  Eyes:     General: Lids are normal.     Conjunctiva/sclera: Conjunctivae normal.  Cardiovascular:     Rate and Rhythm: Normal rate and regular rhythm.     Heart sounds: Normal heart sounds, S1 normal and S2 normal.  Pulmonary:     Breath sounds: Examination of the right-lower field reveals decreased breath sounds. Examination of the left-lower field reveals decreased breath sounds. Decreased breath sounds present.  No wheezing, rhonchi or rales.  Abdominal:     Palpations: Abdomen is soft.     Tenderness: There is no abdominal tenderness.  Musculoskeletal:     Right lower leg: Swelling present.     Left lower leg: Swelling present.  Skin:    General: Skin is warm.     Findings: No rash.  Neurological:     Mental Status: She is alert and oriented to person, place, and time.     Data  Reviewed: Creatinine 1.03, LDH 229, total protein 4.8, hemoglobin 8.4, platelet count 63 1.2 L removed underneath the right lung  Family Communication: Spoke with patient's daughter on the phone  Disposition: Status is: Inpatient Remains inpatient appropriate because: Just had 1.2 L removed underneath the lung right lung.  Blood pressure little low this afternoon.  Planned Discharge Destination: Home    Time spent: 27 minutes  Author: Loletha Grayer, MD 09/20/2022 2:01 PM  For on call review [email or web address].

## 2022-09-20 NOTE — Progress Notes (Addendum)
Got a phone call from [name] Neisnonger, [phone], stated he was patient's caregiver. Stated he was upset had been told charges were going to be pressed against him because he sent her to the ED and she wasn't running a fever. Stated he had been told there were concerns he was accessing her funds, but stated he did not have access to her funds and it was her daughter that was taking the money. He stated the patient is very confused if she is saying those things about him. He was also upset he had been removed as a contact from [place].Stated he had been in touch with the PCP to try to follow the progress. Also that he didn't have MD orders to send the patient to the ED. Also stated he was concerned he had not gotten a phone call about her medication list. Explained I would let the ED director know about what he said was said about him.Stated patient was telling people he had said "we will send you to hospice and I hope you die there" Stated he did not say that to the patient.

## 2022-09-20 NOTE — Progress Notes (Signed)
[facility]) Hospital Liaison note:  This patient is currently enrolled in [facility] outpatient-based Palliative Care. Will continue to follow for disposition.  Please call with any outpatient palliative questions or concerns.  Thank you, Lorelee Market, LPN [place] [facility] Liaison [phone]

## 2022-09-20 NOTE — Progress Notes (Signed)
Pt arrive back to unit. Resting comfortable in bed. Thoracentesis site right upper side just at bra line. Bandage clean dry intact.

## 2022-09-20 NOTE — Plan of Care (Signed)

## 2022-09-20 NOTE — TOC Progression Note (Signed)
Transition of Care [facility]) - Progression Note    Patient Details  Name: Natasha [name] MRN: [id number] Date of Birth: January 10, 1946  Transition of Care [facility]) CM/SW [place], [place] Phone Number: 09/20/2022, 4:53 PM  Clinical Narrative:     Patient would like to speak with TOC about her housing and caregiver. TOC to follow up tomorrow.  Expected Discharge Plan: [place] Barriers to Discharge: Continued Medical Work up  Expected Discharge Plan and Services Expected Discharge Plan: [place] In-house Referral: Clinical Social Work   Post Acute Care Choice: [place] arrangements for the past 2 months: [place]                 DME Arranged: Environmental consultant DME Agency: [employer]       HH Arranged: PT, OT [place] Agency: [place] Date [place]: 09/19/22 Time [place]: 7412 Representative spoke with at [place]: [place] (SDOH) Interventions Food Insecurity Interventions: Intervention Not Indicated  Readmission Risk Interventions    08/17/2022   11:05 AM 08/04/2022    2:54 PM  Readmission Risk Prevention Plan  Transportation Screening Complete Complete  PCP or Specialist Appt within 5-7 Days  Complete  PCP or Specialist Appt within 3-5 Days Complete   Home Care Screening  Complete  Medication Review (RN CM)  Complete  HRI or Home Care Consult Complete   Social Work Consult for [place] Planning/Counseling Complete   Palliative Care Screening Not Applicable   Medication Review Press photographer) Complete

## 2022-09-20 NOTE — Procedures (Signed)
Ultrasound-guided diagnostic and therapeutic right thoracentesis performed yielding 1.2 liters of amber colored fluid. No immediate complications.   Diagnostic fluid was sent to the lab for further analysis. Follow-up chest x-ray pending. EBL is < 2 ml.

## 2022-09-20 NOTE — Progress Notes (Addendum)
Nutrition Brief Note  Patient identified on the Malnutrition Screening Tool (MST) Report  Wt Readings from Last 15 Encounters:  09/20/22 72.8 kg  09/10/22 73.9 kg  09/09/22 74.4 kg  08/31/22 71.4 kg  08/23/22 63.5 kg  08/16/22 63.5 kg  08/16/22 71.3 kg  07/28/22 68.1 kg  07/15/22 77.1 kg  07/05/22 68.9 kg  06/22/22 75.5 kg  06/16/22 75.9 kg  05/19/22 70.5 kg  05/12/22 71.6 kg  04/16/22 74.1 kg   Pt with medical history significant for COPD, chronic hypoxic respiratory failure, chronic HFpEF, type 2 diabetes mellitus, hypothyroidism, chronic anemia and thrombocytopenia, and marginal zone lymphoma who presents with increased shortness of breath, leg swelling, and fatigue.   Pt admitted with iron deficiency anemia and CHF.   Reviewed I/O's: -2 L x 24 hours and -2.7 L since admission  UOP: 2.4 L x 24 hours  Pt out of room at time of visit. No family at bedside.   Pt familiar to this RD due to multiple previous admissions.   Observed breakfast tray- pt consumed 100% of meal.  Lab Results  Component Value Date   HGBA1C 5.5 06/17/2022   PTA DM medications are 5-10 units insulin regular TID with meals and 5 units insulin NPH regular BID.   Labs reviewed: 220 (inpatient orders for glycemic control are 0-6 units insulin aspart TID with meals and 0-5 units insulin aspart daily at bedtime).    Current diet order is regular, patient is consuming approximately 100% of meals at this time. Labs and medications reviewed.   No nutrition interventions warranted at this time. If nutrition issues arise, please consult RD.   Loistine Chance, RD, LDN, [place] Registered Dietitian II Certified Diabetes Care and Education Specialist Please refer to [facility] for RD and/or RD on-call/weekend/after hours pager

## 2022-09-21 ENCOUNTER — Inpatient Hospital Stay: Payer: Medicare Other

## 2022-09-21 ENCOUNTER — Inpatient Hospital Stay: Payer: Medicare Other | Admitting: Nurse Practitioner

## 2022-09-21 ENCOUNTER — Other Ambulatory Visit (HOSPITAL_COMMUNITY): Payer: Self-pay

## 2022-09-21 ENCOUNTER — Inpatient Hospital Stay: Payer: Medicare Other | Admitting: Pharmacist

## 2022-09-21 DIAGNOSIS — J90 Pleural effusion, not elsewhere classified: Secondary | ICD-10-CM | POA: Diagnosis not present

## 2022-09-21 DIAGNOSIS — I5033 Acute on chronic diastolic (congestive) heart failure: Secondary | ICD-10-CM | POA: Diagnosis not present

## 2022-09-21 DIAGNOSIS — E1129 Type 2 diabetes mellitus with other diabetic kidney complication: Secondary | ICD-10-CM | POA: Diagnosis not present

## 2022-09-21 DIAGNOSIS — D509 Iron deficiency anemia, unspecified: Secondary | ICD-10-CM | POA: Diagnosis not present

## 2022-09-21 LAB — BASIC METABOLIC PANEL
Anion gap: 5 (ref 5–15)
BUN: 40 mg/dL — ABNORMAL HIGH (ref 8–23)
CO2: 32 mmol/L (ref 22–32)
Calcium: 7.8 mg/dL — ABNORMAL LOW (ref 8.9–10.3)
Chloride: 103 mmol/L (ref 98–111)
Creatinine, Ser: 1.1 mg/dL — ABNORMAL HIGH (ref 0.44–1.00)
GFR, Estimated: 53 mL/min — ABNORMAL LOW (ref >60)
Glucose, Bld: 129 mg/dL — ABNORMAL HIGH (ref 70–99)
Potassium: 4.3 mmol/L (ref 3.5–5.1)
Sodium: 140 mmol/L (ref 135–145)

## 2022-09-21 LAB — CYTOLOGY - NON PAP

## 2022-09-21 LAB — CBC
HCT: 25.3 % — ABNORMAL LOW (ref 36.0–46.0)
Hemoglobin: 7.8 g/dL — ABNORMAL LOW (ref 12.0–15.0)
MCH: 28.7 pg (ref 26.0–34.0)
MCHC: 30.8 g/dL (ref 30.0–36.0)
MCV: 93 fL (ref 80.0–100.0)
Platelets: 66 10*3/uL — ABNORMAL LOW (ref 150–400)
RBC: 2.72 MIL/uL — ABNORMAL LOW (ref 3.87–5.11)
RDW: 18.3 % — ABNORMAL HIGH (ref 11.5–15.5)
WBC: 3.6 10*3/uL — ABNORMAL LOW (ref 4.0–10.5)
nRBC: 1.9 % — ABNORMAL HIGH (ref 0.0–0.2)

## 2022-09-21 LAB — MISC LABCORP TEST (SEND OUT): Labcorp test code: 9985

## 2022-09-21 LAB — GLUCOSE, CAPILLARY
Glucose-Capillary: 128 mg/dL — ABNORMAL HIGH (ref 70–99)
Glucose-Capillary: 159 mg/dL — ABNORMAL HIGH (ref 70–99)
Glucose-Capillary: 120 mg/dL — ABNORMAL HIGH (ref 70–99)
Glucose-Capillary: 198 mg/dL — ABNORMAL HIGH (ref 70–99)

## 2022-09-21 MED ORDER — INSULIN NPH ISOPHANE & REGULAR (70-30) 100 UNIT/ML ~~LOC~~ SUSP: 5.0000 [IU] | Freq: Two times a day (BID) | SUBCUTANEOUS | 0 refills | Status: DC

## 2022-09-21 NOTE — Progress Notes (Signed)
Patient not seen today.  Currently admitted at [facility].  Will have [facility] Liaison team follow.

## 2022-09-21 NOTE — Assessment & Plan Note (Signed)
Patient on sliding scale insulin.
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
## 2022-09-21 NOTE — Care Management Important Message (Signed)
Important Message  Patient Details  Name: Natasha [name] MRN: [id number] Date of Birth: August 15, 1946   Medicare Important Message Given:  N/A - LOS <3 / Initial given by admissions     [name] 09/21/2022, 9:11 AM

## 2022-09-21 NOTE — Progress Notes (Signed)
Progress Note   Patient: Natasha [name] NLG:[id number] DOB: 07/27/1946 DOA: 09/18/2022     3 DOS: the patient was seen and examined on 09/21/2022   Brief hospital course: Natasha [name] is a 76 y.o. 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   Patient reports insidiously worsening shortness of breath, fatigue, and leg swelling over recent days to weeks.  She has nonproductive cough but denies fever or chest pain.  She denies any melena, hematochezia, or other signs of bleeding.     She was hospitalized earlier this month with acute on chronic hypoxic respiratory failure and underwent treatment for acute CHF, COPD exacerbation, and had thoracentesis performed.  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     Since the recent hospitalization, she was started on acalabrutinib and had received blood transfusions; most recently, she was transfused 2 units of RBC on 09/14/2022.   ED Course: Upon arrival to the ED, patient is found to be afebrile and saturating well on her usual 4 L/min of supplemental oxygen.  Chest x-ray notable for vascular congestion and increased pleural effusions, right greater than left.  Blood work notable for hemoglobin 6.0, platelets 65,000, BUN 50, BNP 151, and troponin normal x 2.   Patient was given 40 mg IV Lasix in the ED and 2 units of RBC were ordered for transfusion.  Patient was given IV Venofer on 09/19/2022 Patient had a thoracentesis and removed 1.2 L underneath the right lung.  11/28.  Notified that unable to send patient home today because of the social issue.  Transitional care team working to make sure there is a safe disposition.  Assessment and Plan: Iron deficiency  anemia Received 2 units of packed red blood cells during this hospital course.  IV Venofer given on 11/26.  Continue to monitor blood counts.  Hemoglobin 7.8 today.  CT scan of the abdomen negative for retroperitoneal bleed.  Acute on chronic heart failure with preserved ejection fraction (HFpEF) (HCC) Blood pressure little low today we will hold Lasix today  Recurrent pleural effusion Right greater than left.  Thoracentesis removed 1.2 L underneath the right lung  Marginal zone lymphoma ([place]) Recently started on acalabrutinib.  I asked daughter to bring in this medication because we do not have it in stock.  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  Chronic respiratory failure with hypoxia (HCC) Chronically wears oxygen.  Chronic obstructive pulmonary disease (COPD) (HCC) Continue inhalers.  Type 2 diabetes mellitus with proteinuria (HCC) Patient on sliding scale insulin.        Subjective: Patient is feeling better and wanted to go home.  Came in with low blood counts and increased shortness of breath.  Found to have recurrent pleural effusion.  Physical Exam: Vitals:   09/21/22 0250 09/21/22 0537 09/21/22 0805 09/21/22 1654  BP:  (!) 93/52 (!) 91/54 (!) 92/46  Pulse:  90 90 95  Resp:  '18 20 18  '$ Temp:  98.9 F (37.2 C) 98.7 F (37.1 C) 98.4 F (36.9 C)  TempSrc:  Oral    SpO2:  98% 98% 97%  Weight: 70.9 kg     Height:       Physical Exam HENT:     Head: Normocephalic.     Mouth/Throat:  Pharynx: No oropharyngeal exudate.  Eyes:     General: Lids are normal.     Conjunctiva/sclera: Conjunctivae normal.  Cardiovascular:     Rate and Rhythm: Normal rate and regular rhythm.     Heart sounds: Normal heart sounds, S1 normal and S2 normal.  Pulmonary:     Breath sounds: Examination of the right-lower field reveals decreased breath sounds. Examination of the left-lower field reveals decreased breath sounds.  Decreased breath sounds present. No wheezing, rhonchi or rales.  Abdominal:     Palpations: Abdomen is soft.     Tenderness: There is no abdominal tenderness.  Musculoskeletal:     Right lower leg: Swelling present.     Left lower leg: Swelling present.  Skin:    General: Skin is warm.     Findings: No rash.  Neurological:     Mental Status: She is alert and oriented to person, place, and time.     Data Reviewed: Creatinine 1.1, hemoglobin 7.8, platelet count 66, white blood cell count 3.6  Family Communication: With patient's daughter on the phone  Disposition: Status is: Inpatient Remains inpatient appropriate because: Unfortunately unable to discharge today secondary to safety issue.  Transitional care team working on a safe disposition plan  Planned Discharge Destination: Home with [place]    Time spent: 27 minutes  Author: Loletha Grayer, MD 09/21/2022 5:19 PM  For on call review [email or web address].

## 2022-09-21 NOTE — Consult Note (Addendum)
   [facility] Inpatient Consult   09/21/2022  Dezi Brauner Bosher 12/25/1945 [id number]  [facility] [ACO] Patient: [place] [facility] Liaison RN, remote review for patient at [facility]  Primary Care Provider:  [name], NP with [facility], is listed for transition of care follow up   Patient is currently active with [place] [facility] for chronic disease management services.  Patient has been engaged by a [place].  Our community based plan of care has focused on disease management and community resource support.    Plan: Update [facility] RN Care Coordinator of hospital events noted for post hospital follow up needs. Also, touched basis with inpatient TOC team that [place] to follow. Both contacted via secure chat.  Of note, [facility] Care Management services does not replace or interfere with any services that are needed or arranged by inpatient [facility] care management team.   For additional questions or referrals please contact:  Natividad Brood, RN BSN [place]  [phone] business mobile phone Toll free office [phone]  *[place]  [phone] Fax number: [phone] [email or web address]

## 2022-09-22 ENCOUNTER — Other Ambulatory Visit (HOSPITAL_COMMUNITY): Payer: Self-pay

## 2022-09-22 DIAGNOSIS — D509 Iron deficiency anemia, unspecified: Secondary | ICD-10-CM | POA: Diagnosis not present

## 2022-09-22 DIAGNOSIS — E1122 Type 2 diabetes mellitus with diabetic chronic kidney disease: Secondary | ICD-10-CM

## 2022-09-22 DIAGNOSIS — D649 Anemia, unspecified: Secondary | ICD-10-CM | POA: Diagnosis not present

## 2022-09-22 DIAGNOSIS — J90 Pleural effusion, not elsewhere classified: Secondary | ICD-10-CM | POA: Diagnosis not present

## 2022-09-22 LAB — GLUCOSE, CAPILLARY
Glucose-Capillary: 136 mg/dL — ABNORMAL HIGH (ref 70–99)
Glucose-Capillary: 177 mg/dL — ABNORMAL HIGH (ref 70–99)
Glucose-Capillary: 214 mg/dL — ABNORMAL HIGH (ref 70–99)
Glucose-Capillary: 222 mg/dL — ABNORMAL HIGH (ref 70–99)

## 2022-09-22 LAB — COMP PANEL: LEUKEMIA/LYMPHOMA: Immunophenotypic Profile: 22

## 2022-09-22 LAB — CBC WITH DIFFERENTIAL/PLATELET
Abs Immature Granulocytes: 0.06 10*3/uL (ref 0.00–0.07)
Basophils Absolute: 0 10*3/uL (ref 0.0–0.1)
Basophils Relative: 1 %
Eosinophils Absolute: 0.1 10*3/uL (ref 0.0–0.5)
Eosinophils Relative: 2 %
HCT: 26.8 % — ABNORMAL LOW (ref 36.0–46.0)
Hemoglobin: 8.1 g/dL — ABNORMAL LOW (ref 12.0–15.0)
Immature Granulocytes: 2 %
Lymphocytes Relative: 67 %
Lymphs Abs: 2.6 10*3/uL (ref 0.7–4.0)
MCH: 28.5 pg (ref 26.0–34.0)
MCHC: 30.2 g/dL (ref 30.0–36.0)
MCV: 94.4 fL (ref 80.0–100.0)
Monocytes Absolute: 0.7 10*3/uL (ref 0.1–1.0)
Monocytes Relative: 17 %
Neutro Abs: 0.4 10*3/uL — CL (ref 1.7–7.7)
Neutrophils Relative %: 11 %
Platelets: 77 10*3/uL — ABNORMAL LOW (ref 150–400)
RBC: 2.84 MIL/uL — ABNORMAL LOW (ref 3.87–5.11)
RDW: 18.2 % — ABNORMAL HIGH (ref 11.5–15.5)
Smear Review: NORMAL
WBC: 3.8 10*3/uL — ABNORMAL LOW (ref 4.0–10.5)
nRBC: 1.9 % — ABNORMAL HIGH (ref 0.0–0.2)

## 2022-09-22 MED ORDER — ACALABRUTINIB MALEATE 100 MG PO TABS
100.0000 mg | ORAL_TABLET | Freq: Two times a day (BID) | ORAL | Status: DC
  Administered 2022-09-22 – 2022-09-24 (×5): 100 mg via ORAL
  Filled 2022-09-22 (×5): qty 1

## 2022-09-22 MED ORDER — INSULIN ASPART PROT & ASPART (70-30 MIX) 100 UNIT/ML ~~LOC~~ SUSP
10.0000 [IU] | Freq: Every day | SUBCUTANEOUS | Status: DC
  Administered 2022-09-22: 10 [IU] via SUBCUTANEOUS
  Filled 2022-09-22: qty 10

## 2022-09-22 MED ORDER — CHLORHEXIDINE GLUCONATE CLOTH 2 % EX PADS: 6.0000 | MEDICATED_PAD | Freq: Every day | CUTANEOUS | Status: DC
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
## 2022-09-22 NOTE — Progress Notes (Incomplete)
Hematology/Oncology Consult note [facility]  [phone] [phone]  Patient Care Team: [name], NP as PCP - General (Nurse Practitioner) [name], RN (Inactive) as Case Manager [name], RN as [facility] [employer]   Name of the patient: Natasha [name]  [id number]  1946/05/28   Date of visit: '@TODAY'$ @  Diagnosis- ***  Chief complaint/ Reason for visit- ***  Heme/Onc history: ***  Interval history- ***  ECOG PS- *** Pain scale- *** Opioid associated constipation- ***  Review of systems- ROS    No Known Allergies   Past Medical History:  Diagnosis Date   Anemia    Cancer ([place])    lymphoma-stomach    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   CHF (congestive heart failure) (HCC)    COPD (chronic obstructive pulmonary disease) (HCC)    Diabetes mellitus without complication (HCC)    Hypotension    Pleural effusion    ARMc 831m,  2 weeks ago   Vaginal delivery    x 5     Past Surgical History:  Procedure Laterality Date   IR IMAGING GUIDED PORT INSERTION  08/16/2022   TONSILLECTOMY Bilateral    as a child    Social History   Socioeconomic History   Marital status: Widowed    Spouse name: Not on file   Number of children: 2   Years of education: Not on file   Highest education level: 9th grade  Occupational History   Occupation: unemployed  Tobacco Use   Smoking status: Former    Packs/day: 2.00    Years: 55.00    Total pack years: 110.00    Types: Cigarettes    Start date: 07/07/1961    Quit date: 08/02/2022    Years since quitting: 0.1   Smokeless tobacco: Never   Tobacco comments:    1/2 pack she states but family says 2 PPD  Vaping Use   Vaping Use: Never used  Substance and Sexual Activity   Alcohol use: No   Drug use: Never   Sexual activity: Not Currently    Partners: Male    Birth control/protection: Post-menopausal  Other Topics Concern   Not on file   Social History Narrative   08/14/20   [place]  Living: to be near daughter   Work: retired      FPhysiological scientistchildren - JIT consultant(nearby) and son JEvelena Peat[place]  8 grandchildren, & 2 great-grandchildren.      Enjoys: stays at home      Exercise: walking to her daughter's store   Diet: eats fruit, air fried chicken      Safety   Seat belts: Yes    Guns: No   Safe in relationships: Yes    Social Determinants of Health   Financial Resource Strain: Medium Risk (08/24/2022)   Overall Financial Resource Strain (CARDIA)    Difficulty of Paying Living Expenses: Somewhat hard  Food Insecurity: No Food Insecurity (09/18/2022)   Hunger Vital Sign    Worried About Running Out of Food in the Last Year: Never true    Ran Out of Food in the Last Year: Never true  Recent Concern: [place] (08/30/2022)   Hunger Vital Sign    Worried About Running Out of Food in the Last Year: Sometimes true    Ran Out of Food in the Last Year: Sometimes true  Transportation Needs: No Transportation Needs (09/18/2022)  PRAPARE - Hydrologist (Medical): No    Lack of Transportation (Non-Medical): No  Recent Concern: Transportation Needs - Unmet Transportation Needs (08/30/2022)   PRAPARE - Hydrologist (Medical): Yes    Lack of Transportation (Non-Medical): No  Physical Activity: Inactive (08/24/2022)   Exercise Vital Sign    Days of Exercise per Week: 0 days    Minutes of Exercise per Session: 0 min  Stress: Stress Concern Present (08/24/2022)   [place]    Feeling of Stress : To some extent  Social Connections: Socially Isolated (08/24/2022)   Social Connection and Isolation Panel [NHANES]    Frequency of Communication with Friends and Family: More than three times a week    Frequency of Social Gatherings with Friends and Family: More than three  times a week    Attends Religious Services: Never    Marine scientist or Organizations: No    Attends Archivist Meetings: Never    Marital Status: Widowed  Intimate Partner Violence: Not At Risk (09/18/2022)   Humiliation, Afraid, Rape, and Kick questionnaire    Fear of Current or Ex-Partner: No    Emotionally Abused: No    Physically Abused: No    Sexually Abused: No    Family History  Problem Relation Age of Onset   Breast cancer Mother    Alzheimer's disease Father    Lung cancer Father        lung   Varicose Veins Brother    Heart attack Brother      Current Facility-Administered Medications:    0.9 %  sodium chloride infusion, 10 mL/hr, Intravenous, Once, Opyd, Ilene Qua, MD, Held at 09/18/22 1016   acalabrutinib maleate (CALQUENCE) tablet 100 mg, 100 mg, Oral, BID, Sindy Guadeloupe, MD, 100 mg at 09/22/22 1022   acetaminophen (TYLENOL) tablet 650 mg, 650 mg, Oral, Q6H PRN, 650 mg at 09/21/22 0952 **OR** acetaminophen (TYLENOL) suppository 650 mg, 650 mg, Rectal, Q6H PRN, Opyd, Ilene Qua, MD   acyclovir (ZOVIRAX) 200 MG capsule 400 mg, 400 mg, Oral, Daily, Opyd, Timothy S, MD, 400 mg at 09/22/22 1022   albuterol (PROVENTIL) (2.5 MG/3ML) 0.083% nebulizer solution 2.5 mg, 2.5 mg, Inhalation, Q6H PRN, Opyd, Timothy S, MD   insulin aspart (novoLOG) injection 0-5 Units, 0-5 Units, Subcutaneous, QHS, Opyd, Timothy S, MD, 2 Units at 09/19/22 2206   insulin aspart (novoLOG) injection 0-6 Units, 0-6 Units, Subcutaneous, TID WC, Opyd, Ilene Qua, MD, 1 Units at 09/22/22 1228   insulin aspart protamine- aspart (NOVOLOG MIX 70/30) injection 10 Units, 10 Units, Subcutaneous, Q breakfast, Fritzi Mandes, MD, 10 Units at 09/22/22 1022   ipratropium-albuterol (DUONEB) 0.5-2.5 (3) MG/3ML nebulizer solution 3 mL, 3 mL, Inhalation, TID, Opyd, Ilene Qua, MD, 3 mL at 09/22/22 1341   levothyroxine (SYNTHROID) tablet 50 mcg, 50 mcg, Oral, Q0600, Opyd, Ilene Qua, MD, 50 mcg at 09/22/22  0535   oxyCODONE (Oxy IR/ROXICODONE) immediate release tablet 5 mg, 5 mg, Oral, Q4H PRN, Opyd, Timothy S, MD, 5 mg at 09/20/22 2005   senna-docusate (Senokot-S) tablet 1 tablet, 1 tablet, Oral, QHS PRN, Opyd, Ilene Qua, MD   sodium chloride flush (NS) 0.9 % injection 3 mL, 3 mL, Intravenous, Q12H, Opyd, Ilene Qua, MD, 3 mL at 09/22/22 1228  Physical exam:  Vitals:   09/22/22 0124 09/22/22 0331 09/22/22 0832 09/22/22 0844  BP:  (!) 93/52 (!) 100/52 Marland Kitchen)  92/55  Pulse:  90 86 85  Resp:  20 (!) 22 20  Temp:  98.7 F (37.1 C) 98.2 F (36.8 C)   TempSrc:  Oral Oral   SpO2:  97%    Weight: 153 lb (69.4 kg)     Height:       Physical Exam      Latest Ref Rng & Units 09/21/2022    4:50 AM  CMP  Glucose 70 - 99 mg/dL 129   BUN 8 - 23 mg/dL 40   Creatinine 0.44 - 1.00 mg/dL 1.10   Sodium 135 - 145 mmol/L 140   Potassium 3.5 - 5.1 mmol/L 4.3   Chloride 98 - 111 mmol/L 103   CO2 22 - 32 mmol/L 32   Calcium 8.9 - 10.3 mg/dL 7.8       Latest Ref Rng & Units 09/21/2022    4:50 AM  CBC  WBC 4.0 - 10.5 K/uL 3.6   Hemoglobin 12.0 - 15.0 g/dL 7.8   Hematocrit 36.0 - 46.0 % 25.3   Platelets 150 - 400 K/uL 66     '@IMAGES'$ @  DG Chest Port 1 View  Result Date: 09/20/2022 CLINICAL DATA:  Status post right thoracentesis EXAM: PORTABLE CHEST 1 VIEW COMPARISON:  None Available. FINDINGS: There is a right-sided chest port with catheter tip terminating near the superior cavoatrial junction. Stable appearance of the cardiomediastinal silhouette. Bilateral moderate pleural effusions, slightly improved on the right. No findings of pneumothorax. Scattered dependent hazy opacities, likely reflective of underlying passive atelectasis. Osseous structures are unremarkable. IMPRESSION: Bilateral pleural effusions, slightly improved on the right side following thoracentesis. No findings of pneumothorax. Electronically Signed   By: Albin Felling M.D.   On: 09/20/2022 13:25   US THORACENTESIS ASP PLEURAL  SPACE W/IMG GUIDE  Result Date: 09/20/2022 INDICATION: 76 year old female. History of gastric lymphoma with recurrent pleural effusions. Currently admitted for shortness of breath. Found to have bilateral pleural effusions right greater than left. Request is for therapeutic and diagnostic thoracentesis EXAM: ULTRASOUND GUIDED RIGHT-SIDED THERAPEUTIC AND DIAGNOSTIC THORACENTESIS MEDICATIONS: Lidocaine 1% 10 mL COMPLICATIONS: None immediate. PROCEDURE: An ultrasound guided thoracentesis was thoroughly discussed with the patient and questions answered. The benefits, risks, alternatives and complications were also discussed. The patient understands and wishes to proceed with the procedure. Written consent was obtained. Ultrasound was performed to localize and mark an adequate pocket of fluid in the right chest. The area was then prepped and draped in the normal sterile fashion. 1% Lidocaine was used for local anesthesia. Under ultrasound guidance a 6 Fr Safe-T-Centesis catheter was introduced. Thoracentesis was performed. The catheter was removed and a dressing applied. FINDINGS: A total of approximately 1.2 L of amber colored fluid was removed. Samples were sent to the laboratory as requested by the clinical team. IMPRESSION: Successful ultrasound guided therapeutic and diagnostic right-sided thoracentesis yielding 1.2 L of pleural fluid. Read by: Rushie Nyhan, NP Electronically Signed   By: Albin Felling M.D.   On: 09/20/2022 13:22   CT ABDOMEN PELVIS WO CONTRAST  Result Date: 09/19/2022 CLINICAL DATA:  Abdominal pain, acute, nonlocalized r/o retroperitoneal bleed. Lymphoma EXAM: CT ABDOMEN AND PELVIS WITHOUT CONTRAST TECHNIQUE: Multidetector CT imaging of the abdomen and pelvis was performed following the standard protocol without IV contrast. RADIATION DOSE REDUCTION: This exam was performed according to the departmental dose-optimization program which includes automated exposure control, adjustment  of the mA and/or kV according to patient size and/or use of iterative reconstruction technique. COMPARISON:  MRI lumbar  spine 12/20/2019. FINDINGS: Lower chest: Small to moderate right and small left pleural effusions that are partially visualized. Partial collapse of the right lower lobe and passive atelectasis of left lower lobe. Partially visualized central venous catheter with tip terminating at superior caval junction. Cardiac findings suggestive of anemia. Hepatobiliary: No focal liver abnormality. Calcified gallstone noted within the gallbladder lumen. No gallbladder wall thickening or pericholecystic fluid. No biliary dilatation. Pancreas: No focal lesion. Normal pancreatic contour. No surrounding inflammatory changes. No main pancreatic ductal dilatation. Spleen: Normal in size without focal abnormality. Adrenals/Urinary Tract: No adrenal nodule bilaterally. Punctate right nephrolithiasis. No left nephrolithiasis. No hydronephrosis. No definite contour-deforming renal mass. No ureterolithiasis or hydroureter. The urinary bladder is unremarkable. Stomach/Bowel: Stomach is within normal limits. No evidence of bowel wall thickening or dilatation. Appendix appears normal. Vascular/Lymphatic: No abdominal aorta or iliac aneurysm. At least moderate atherosclerotic plaque of the aorta and its branches. Markedly difficult to delineate on this noncontrast study retroperitoneal soft tissue densities and mesenteric soft tissue densities likely representing marked adenopathy. Bilateral pelvic sidewall and inguinal lymphadenopathy: As an example a 1.9 cm right inguinal lymph node (2:86). Reproductive: Uterus and bilateral adnexa are unremarkable. Other: No intraperitoneal free fluid. No intraperitoneal free gas. No organized fluid collection. Musculoskeletal: No abdominal wall hernia or abnormality. Subcutaneus soft tissue edema. No suspicious lytic or blastic osseous lesions. No acute displaced fracture. Chronic L2 and  L3 compression fractures. IMPRESSION: 1. Markedly limited evaluation on this noncontrast study. No definite large retroperitoneal bleed. 2. Difficult to delineate on this noncontrast study retroperitoneal soft tissue densities and mesenteric soft tissue densities representing marked conglomerative lymphadenopathy as well as bilateral pelvic sidewall and inguinal lymphadenopathy consistent with known history of versus recurrent disease. 3. Marked splenomegaly also consistent with known history of lymphoma versus recurrent disease. 4. Nonobstructive punctate right nephrolithiasis. 5. Cholelithiasis with no definite CT findings cute cholecystitis-limited evaluation on this noncontrast study. Consider right upper quadrant ultrasound for a more sensitive evaluation of the gallbladder peer 6. Small to moderate right and small left pleural effusions. Electronically Signed   By: Iven Finn M.D.   On: 09/19/2022 20:00   DG Chest 2 View  Result Date: 09/17/2022 CLINICAL DATA:  Shortness of breath EXAM: CHEST - 2 VIEW COMPARISON:  08/30/2022 FINDINGS: Cardiac shadow is within normal limits. Right chest wall port is seen and stable. Bilateral pleural effusions are noted right considerably greater than left increased from the prior exam. Changes of central vascular congestion are noted consistent with CHF. IMPRESSION: Vascular congestion with increasing pleural effusions right greater than left. Electronically Signed   By: Inez Catalina M.D.   On: 09/17/2022 23:16   CT HEAD WO CONTRAST (5MM)  Result Date: 09/11/2022 CLINICAL DATA:  Headache, new onset. EXAM: CT HEAD WITHOUT CONTRAST TECHNIQUE: Contiguous axial images were obtained from the base of the skull through the vertex without intravenous contrast. RADIATION DOSE REDUCTION: This exam was performed according to the departmental dose-optimization program which includes automated exposure control, adjustment of the mA and/or kV according to patient size and/or  use of iterative reconstruction technique. COMPARISON:  CT without contrast 07/12/2022 FINDINGS: Brain: No acute infarct, hemorrhage, or mass lesion is present. Mild atrophy and white matter changes are stable. The ventricles are of normal size. No significant extraaxial fluid collection is present. The brainstem and cerebellum are within normal limits. Vascular: No hyperdense vessel or unexpected calcification. Skull: Calvarium is intact. No focal lytic or blastic lesions are present. No significant extracranial soft tissue  lesion is present. Sinuses/Orbits: The paranasal sinuses and mastoid air cells are clear. The globes and orbits are within normal limits. Other: Asymmetric enlargement of the parotid gland, right greater than left is similar the prior study. This again raises concern for lymphomatous involvement. IMPRESSION: 1. No acute intracranial abnormality or significant interval change. 2. Stable mild atrophy and white matter disease. This likely reflects the sequela of chronic microvascular ischemia. 3. Asymmetric enlargement of the parotid gland, right greater than left is similar the prior study. This again raises concern for lymphomatous involvement. Electronically Signed   By: San Morelle M.D.   On: 09/11/2022 12:33   DG Chest Port 1 View  Result Date: 08/30/2022 CLINICAL DATA:  Post thoracentesis.  RIGHT. EXAM: PORTABLE CHEST 1 VIEW COMPARISON:  IR ultrasound, earlier same day. Chest XR, 08/29/2022. FINDINGS: Support lines: Stable positioning of RIGHT chest port with catheter tip at the superior cavoatrial junction. Overlying pacer leads. Cardiac apex is obscured. Mediastinal silhouette is within normal limits. Lungs are hypoinflated with perihilar opacities and bibasilar consolidations. Interval removal of RIGHT pleural effusion, with small volume residual. Small-to-moderate volume LEFT pleural effusion. No pneumothorax. No interval osseous abnormality. IMPRESSION: 1. No pneumothorax  status post RIGHT thoracentesis. 2. Bilateral pleural effusions and basilar consolidations, likely atelectasis. Electronically Signed   By: Michaelle Birks M.D.   On: 08/30/2022 12:29   US THORACENTESIS ASP PLEURAL SPACE W/IMG GUIDE  Result Date: 08/30/2022 INDICATION: History of gastric lymphoma with recurrent pleural effusions. Patient presented to ED with complaint of shortness of breath found to have bilateral pleural effusions. Upon ultrasound exam right greater than left. EXAM: ULTRASOUND GUIDED DIAGNOSTIC AND THERAPEUTIC RIGHT THORACENTESIS MEDICATIONS: 10 mL 1 % lidocaine COMPLICATIONS: None immediate. PROCEDURE: An ultrasound guided thoracentesis was thoroughly discussed with the patient and questions answered. The benefits, risks, alternatives and complications were also discussed. The patient understands and wishes to proceed with the procedure. Written consent was obtained. Ultrasound was performed to localize and mark an adequate pocket of fluid in the RIGHT chest. The area was then prepped and draped in the normal sterile fashion. 1% Lidocaine was used for local anesthesia. Under ultrasound guidance a 6 Fr Safe-T-Centesis catheter was introduced. Thoracentesis was performed. The catheter was removed and a dressing applied. FINDINGS: A total of approximately 1.2 L of hazy, amber colored fluid was removed. Samples were sent to the laboratory as requested by the clinical team. IMPRESSION: Successful ultrasound guided right thoracentesis yielding 1.2 L of pleural fluid. Read by: Narda Rutherford, AGNP-BC Electronically Signed   By: Michaelle Birks M.D.   On: 08/30/2022 12:25   DG Chest Portable 1 View  Result Date: 08/29/2022 CLINICAL DATA:  Shortness of breath. EXAM: PORTABLE CHEST 1 VIEW COMPARISON:  08/17/2022 FINDINGS: Moderate to large-sized bilateral pleural effusions, significantly increased. Progressive increase in prominence of the pulmonary vasculature and interstitial markings. Less airspace  opacity and volume loss at the lung bases. Stable right jugular porta catheter with its tip in the region of the superior cavoatrial junction or upper right atrium. Unremarkable bones. IMPRESSION: 1. Moderate to large-sized bilateral pleural effusions, significantly increased. 2. Progressive changes of congestive heart failure. 3. Mildly improved bibasilar atelectasis. Electronically Signed   By: Claudie Revering M.D.   On: 08/29/2022 14:17     Assessment and plan- Patient is a 76 y.o. female ***   Visit Diagnosis 1. Symptomatic anemia   2. Acute on chronic congestive heart failure, unspecified heart failure type ([place])   3. Acute on chronic  heart failure with preserved ejection fraction (HFpEF) ([place])   4. S/P thoracentesis   5. Abnormal pleural fluid   6. Hyperglycemia   7. Type 2 diabetes mellitus with chronic kidney disease, without long-term current use of insulin, unspecified CKD stage ([place])      Dr. Randa Evens, MD, MPH [facility] [phone] 09/22/2022 4:48 PM

## 2022-09-22 NOTE — Progress Notes (Signed)
PT Cancellation Note  Patient Details Name: Natasha [name] MRN: [id number] DOB: April 14, 1946   Cancelled Treatment:     PT attempt. Pt is getting breathing treatment. Will return at a later time when available to participate.    Willette Pa 09/22/2022, 1:52 PM

## 2022-09-22 NOTE — Progress Notes (Signed)
Pt states that care giver was talking to her Dr about sending her to hospice when she does not want to go to hospice.

## 2022-09-22 NOTE — TOC Progression Note (Addendum)
Transition of Care [facility]) - Progression Note    Patient Details  Name: Natasha [name] MRN: [id number] Date of Birth: 18-Mar-1946  Transition of Care [facility]) CM/SW [place], [place] Phone Number: 09/22/2022, 10:06 AM  Clinical Narrative:     TOC left a messages for [facility] [phone] and [facility] [phone]. TOC requested that they return phone calls back with updates.   The patient has been assigned an APS caseworker named Manson Passey. TOC left him a message. His supervisor is Ms. Joe [phone].  TOC spoke to the [employer] and he told TOC that the patient was put on a diet that the patient was not agreeable to. He did not uncover any current criminal activity.   [employer] state that this may be a Catering manager. [name] has established residency in the home. A sheriff from civil court could assist with ejection process if family want the caregiver to leave. Because the patient is renting, the landlord at [facility] [employer] [phone] would have to go through that process.  Patient is still waiting to hear back from APS regarding their investigation.  TOC left a message for an additional APS supervisor named Bernita Buffy [phone].  Expected Discharge Plan: [place] Barriers to Discharge: Continued Medical Work up  Expected Discharge Plan and Services Expected Discharge Plan: [place] In-house Referral: Clinical Social Work   Post Acute Care Choice: [place] arrangements for the past 2 months: [place] Expected Discharge Date: 09/21/22               DME Arranged: [place] Rile DME Agency: [employer]       HH Arranged: PT, OT HH Agency: [place] Date [facility] Agency Contacted: 09/19/22 Time [place]: [id number] Representative spoke with at [place]: [place] (SDOH)  Interventions Food Insecurity Interventions: Intervention Not Indicated  Readmission Risk Interventions    08/17/2022   11:05 AM 08/04/2022    2:54 PM  Readmission Risk Prevention Plan  Transportation Screening Complete Complete  PCP or Specialist Appt within 5-7 Days  Complete  PCP or Specialist Appt within 3-5 Days Complete   Home Care Screening  Complete  Medication Review (RN CM)  Complete  HRI or Home Care Consult Complete   Social Work Consult for [place] Planning/Counseling Complete   Palliative Care Screening Not Applicable   Medication Review Press photographer) Complete

## 2022-09-23 ENCOUNTER — Ambulatory Visit
Admission: RE | Admit: 2022-09-23 | Discharge: 2022-09-23 | Disposition: A | Discharge status: Home / Self Care | Payer: Medicare Other | Source: Ambulatory Visit | Attending: Oncology | Admitting: Oncology

## 2022-09-23 ENCOUNTER — Other Ambulatory Visit (HOSPITAL_COMMUNITY): Payer: Self-pay

## 2022-09-23 DIAGNOSIS — D509 Iron deficiency anemia, unspecified: Secondary | ICD-10-CM | POA: Diagnosis not present

## 2022-09-23 DIAGNOSIS — D649 Anemia, unspecified: Secondary | ICD-10-CM | POA: Diagnosis not present

## 2022-09-23 DIAGNOSIS — J90 Pleural effusion, not elsewhere classified: Secondary | ICD-10-CM | POA: Diagnosis not present

## 2022-09-23 DIAGNOSIS — E1122 Type 2 diabetes mellitus with diabetic chronic kidney disease: Secondary | ICD-10-CM | POA: Diagnosis not present

## 2022-09-23 LAB — GLUCOSE, CAPILLARY
Glucose-Capillary: 125 mg/dL — ABNORMAL HIGH (ref 70–99)
Glucose-Capillary: 143 mg/dL — ABNORMAL HIGH (ref 70–99)
Glucose-Capillary: 111 mg/dL — ABNORMAL HIGH (ref 70–99)
Glucose-Capillary: 185 mg/dL — ABNORMAL HIGH (ref 70–99)

## 2022-09-23 NOTE — Progress Notes (Signed)
[facility] [place] NAME: Natasha [name]    MR#:  [id number]  DATE OF BIRTH:  1946-07-05  SUBJECTIVE:   Patient tells me she wants to go home today after BM bx. Told her to wait till am--she is agreeable Spoke with dter on the phone in the room--dter is ok with the plan   VITALS:  Blood pressure (!) 99/54, pulse 85, temperature 98.4 F (36.9 C), resp. rate 16, height _0  (1.651 m), weight 64.2 kg, SpO2 100 %.  PHYSICAL EXAMINATION:   GENERAL:  76 y.o.-year-old patient lying in the bed with no acute distress. Chronically ill, chronic oxygen LUNGS: Normal breath sounds bilaterally, no wheezing CARDIOVASCULAR: S1, S2 normal. No murmurs,   ABDOMEN: Soft, nontender, nondistended. Bowel sounds present.  EXTREMITIES:+  edema b/l.    NEUROLOGIC: nonfocal  patient is alert and awake SKIN: No obvious rash, lesion, or ulcer.   LABORATORY PANEL:  CBC Recent Labs  Lab 09/22/22 1710  WBC 3.8*  HGB 8.1*  HCT 26.8*  PLT 77*     Chemistries  Recent Labs  Lab 09/19/22 0410 09/20/22 0419 09/21/22 0450  NA 143   < > 140  K 3.6   < > 4.3  CL 106   < > 103  CO2 29   < > 32  GLUCOSE 155*   < > 129*  BUN 46*   < > 40*  CREATININE 0.92   < > 1.10*  CALCIUM 8.1*   < > 7.8*  MG 1.7  --   --    < > = values in this interval not displayed.     Assessment and Plan  Natasha [name] is a 76 y.o. 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   Since the recent hospitalization, she was started on acalabrutinib and had received blood transfusions; most recently, she was transfused 2 units of RBC on 09/14/2022.   Iron deficiency anemia --Received 2 units of packed red blood cells during  this hospital course.  -- IV Venofer given on 11/26.   --Continue to monitor blood counts.  -- Hemoglobin 7.8 today.   --CT scan of the abdomen negative for retroperitoneal bleed.   Acute on chronic heart failure with preserved ejection fraction (HFpEF) (HCC) Blood pressure little low today we will hold Lasix today   Recurrent pleural effusion Right greater than left.  Thoracentesis removed 1.2 L underneath the right lung   Marginal zone lymphoma ([place]) Recently started on acalabrutinib.   Pt to get oral chemo per order by Dr Janese Banks. 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   Chronic respiratory failure with hypoxia (HCC) Chronically wears oxygen.   Chronic obstructive pulmonary disease (COPD) (HCC) Continue inhalers.   Type 2 diabetes mellitus with proteinuria (HCC) Patient on sliding scale insulin.  Barrier to discharge social issue with caregiver at home per [facility] APS investigation pending. [employer] has been involved.     Procedures: Family communication :none Consults :oncology CODE STATUS: full DVT Prophylaxis :scd Level of care: Med-Surg Status is: Inpatient Remains inpatient appropriate because: pt will d/c to home in am and APS will f/u at home. D/w pt, dter and CSW Curator  Eric TOTAL TIME TAKING CARE OF THIS PATIENT: 25 minutes.  >50% time spent on counselling and coordination of care  Note: This dictation was prepared with Dragon dictation along with smaller phrase technology. Any transcriptional errors that result from this process are unintentional.  Fritzi Mandes M.D    [facility]   CC: Primary care physician; [name], NP

## 2022-09-23 NOTE — TOC Transition Note (Addendum)
Transition of Care [facility]) - CM/SW Discharge Note   Patient Details  Name: Natasha [name] MRN: [id number] Date of Birth: [birth year]  [facility] CM/SW Contact:  Colen Darling, [place] Phone Number: 09/23/2022, 1:50 PM   Clinical Narrative:     Patient has had an [place] delivered. [facility] HHPT/SW/OT has accepted the patient. TOC spoke with [place] at [place]. APS caseworker to follow up post discharge.   09/24/22: Patient will discharge home today.  Final next level of care: [place] Barriers to Discharge: Continued Medical Work up   Patient Goals and CMS Choice Patient states their goals for this hospitalization and ongoing recovery are:: to get better   Choice offered to / list presented to : Patient  Discharge Placement   [facility], [facility] RW, APS              Patient to be transferred to facility by: Daughter Name of family member notified: Lenon Oms Patient and family notified of of transfer: 09/23/22  Discharge Plan and Services In-house Referral: Clinical Social Work   Post Acute Care Choice: Home Health          DME Arranged: Gilford Rile DME Agency: [employer] Date DME Agency Contacted: 09/23/22 Time DME Agency Contacted: [phone] Representative spoke with at DME Agency: Erasmo Downer HH Arranged: PT [place]: [place] Date [place]: 09/19/22 Time [place]: [place] Representative spoke with at [place]: [place] (SDOH) Interventions Food Insecurity Interventions: Intervention Not Indicated   Readmission Risk Interventions    08/17/2022   11:05 AM 08/04/2022    2:54 PM  Readmission Risk Prevention Plan  Transportation Screening Complete Complete  PCP or Specialist Appt within 5-7 Days  Complete  PCP or Specialist Appt within 3-5 Days Complete   Home Care Screening  Complete  Medication Review (RN CM)  Complete  HRI or Home Care Consult Complete   Social Work  Consult for [place] Planning/Counseling Complete   Palliative Care Screening Not Applicable   Medication Review Press photographer) Complete

## 2022-09-23 NOTE — Progress Notes (Signed)
Mobility Specialist - Progress Note   09/23/22 1200  Mobility  Activity Ambulated with assistance to bathroom;Ambulated with assistance in room  Level of Assistance Other (Comment) (HHA)  Assistive Device None  Distance Ambulated (ft) 15 ft  Activity Response Tolerated well  $Mobility charge 1 Mobility     Mobility responded to chair alarm. Pt ambulating to bathroom on arrival, furniture cruising through room. Pt assisted to bathroom and left in room with NT upon exit.    Kathee Delton Mobility Specialist 09/23/22, 12:23 PM

## 2022-09-23 NOTE — TOC Progression Note (Signed)
Transition of Care [facility]) - Progression Note    Patient Details  Name: Natasha [name] MRN: [id number] Date of Birth: [birth year]  Transition of Care [facility]) CM/SW [place], [place] Phone Number: 09/23/2022, 1:41 PM  Clinical Narrative:     TOC arranging HHPT and DME. APS will follow up after discharge.  Expected Discharge Plan: [place] Barriers to Discharge: Continued Medical Work up  Expected Discharge Plan and Services Expected Discharge Plan: [place] In-house Referral: Clinical Social Work   Post Acute Care Choice: [place] arrangements for the past 2 months: [place] Expected Discharge Date: 09/21/22               DME Arranged: Gilford Rile DME Agency: [employer]       HH Arranged: PT, OT HH Agency: [place] Date [facility] Agency Contacted: 09/19/22 Time [place]: [id number] Representative spoke with at [place]: [place] (SDOH) Interventions Food Insecurity Interventions: Intervention Not Indicated  Readmission Risk Interventions    08/17/2022   11:05 AM 08/04/2022    2:54 PM  Readmission Risk Prevention Plan  Transportation Screening Complete Complete  PCP or Specialist Appt within 5-7 Days  Complete  PCP or Specialist Appt within 3-5 Days Complete   Home Care Screening  Complete  Medication Review (RN CM)  Complete  HRI or Home Care Consult Complete   Social Work Consult for [place] Planning/Counseling Complete   Palliative Care Screening Not Applicable   Medication Review Press photographer) Complete

## 2022-09-23 NOTE — Plan of Care (Signed)

## 2022-09-23 NOTE — Progress Notes (Signed)
PT Cancellation Note  Patient Details Name: Natasha [name] MRN: [id number] DOB: Apr 08, 1946   Cancelled Treatment:     Pt declined after just retuning to bed. Will continue PT next available date/time.   THERISA MENNELLA 09/23/2022, 4:13 PM

## 2022-09-24 ENCOUNTER — Inpatient Hospital Stay (HOSPITAL_COMMUNITY): Payer: Medicare Other

## 2022-09-24 ENCOUNTER — Inpatient Hospital Stay: Payer: Medicare Other

## 2022-09-24 ENCOUNTER — Ambulatory Visit: Payer: Self-pay

## 2022-09-24 ENCOUNTER — Other Ambulatory Visit: Payer: Medicare Other

## 2022-09-24 ENCOUNTER — Telehealth: Payer: Self-pay | Admitting: *Deleted

## 2022-09-24 DIAGNOSIS — C8580 Other specified types of non-Hodgkin lymphoma, unspecified site: Secondary | ICD-10-CM | POA: Diagnosis not present

## 2022-09-24 DIAGNOSIS — D649 Anemia, unspecified: Secondary | ICD-10-CM | POA: Diagnosis not present

## 2022-09-24 DIAGNOSIS — I509 Heart failure, unspecified: Secondary | ICD-10-CM

## 2022-09-24 DIAGNOSIS — C884 Extranodal marginal zone B-cell lymphoma of mucosa-associated lymphoid tissue [MALT-lymphoma]: Secondary | ICD-10-CM | POA: Diagnosis not present

## 2022-09-24 DIAGNOSIS — E1122 Type 2 diabetes mellitus with diabetic chronic kidney disease: Secondary | ICD-10-CM | POA: Diagnosis not present

## 2022-09-24 LAB — CBC WITH DIFFERENTIAL/PLATELET
Abs Immature Granulocytes: 0.05 10*3/uL (ref 0.00–0.07)
Basophils Absolute: 0.1 10*3/uL (ref 0.0–0.1)
Basophils Relative: 1 %
Eosinophils Absolute: 0.1 10*3/uL (ref 0.0–0.5)
Eosinophils Relative: 2 %
HCT: 26.5 % — ABNORMAL LOW (ref 36.0–46.0)
Hemoglobin: 7.9 g/dL — ABNORMAL LOW (ref 12.0–15.0)
Immature Granulocytes: 1 %
Lymphocytes Relative: 78 %
Lymphs Abs: 3.7 10*3/uL (ref 0.7–4.0)
MCH: 28.4 pg (ref 26.0–34.0)
MCHC: 29.8 g/dL — ABNORMAL LOW (ref 30.0–36.0)
MCV: 95.3 fL (ref 80.0–100.0)
Monocytes Absolute: 0.6 10*3/uL (ref 0.1–1.0)
Monocytes Relative: 12 %
Neutro Abs: 0.3 10*3/uL — CL (ref 1.7–7.7)
Neutrophils Relative %: 6 %
Platelets: 85 10*3/uL — ABNORMAL LOW (ref 150–400)
RBC: 2.78 MIL/uL — ABNORMAL LOW (ref 3.87–5.11)
RDW: 18 % — ABNORMAL HIGH (ref 11.5–15.5)
Smear Review: DECREASED
WBC: 4.8 10*3/uL (ref 4.0–10.5)
nRBC: 1.7 % — ABNORMAL HIGH (ref 0.0–0.2)

## 2022-09-24 LAB — BODY FLUID CULTURE W GRAM STAIN
Culture: NO GROWTH
Gram Stain: NONE SEEN

## 2022-09-24 LAB — GLUCOSE, CAPILLARY
Glucose-Capillary: 106 mg/dL — ABNORMAL HIGH (ref 70–99)
Glucose-Capillary: 111 mg/dL — ABNORMAL HIGH (ref 70–99)
Glucose-Capillary: 111 mg/dL — ABNORMAL HIGH (ref 70–99)

## 2022-09-24 MED ORDER — FENTANYL CITRATE (PF) 100 MCG/2ML IJ SOLN
INTRAMUSCULAR | Status: AC | PRN
  Administered 2022-09-24: 25 ug via INTRAVENOUS

## 2022-09-24 MED ORDER — IPRATROPIUM-ALBUTEROL 0.5-2.5 (3) MG/3ML IN SOLN: 3.0000 mL | Freq: Two times a day (BID) | RESPIRATORY_TRACT | Status: DC

## 2022-09-24 MED ORDER — FENTANYL CITRATE (PF) 100 MCG/2ML IJ SOLN
INTRAMUSCULAR | Status: AC
  Filled 2022-09-24: qty 2

## 2022-09-24 MED ORDER — MIDAZOLAM HCL 2 MG/2ML IJ SOLN
INTRAMUSCULAR | Status: AC
  Filled 2022-09-24: qty 2

## 2022-09-24 MED ORDER — HEPARIN SOD (PORK) LOCK FLUSH 100 UNIT/ML IV SOLN
INTRAVENOUS | Status: AC
  Filled 2022-09-24: qty 5

## 2022-09-24 MED ORDER — LIDOCAINE HCL (PF) 1 % IJ SOLN
6.0000 mL | Freq: Once | INTRAMUSCULAR | Status: AC
  Administered 2022-09-24: 6 mL
  Filled 2022-09-24: qty 6

## 2022-09-24 NOTE — Procedures (Signed)
Interventional Radiology Procedure Note  Date of Procedure: 09/24/2022  Procedure: CT BMBx  Findings:  1. BMBx right ilium    Complications: No immediate complications noted.   Estimated Blood Loss: minimal  Follow-up and Recommendations: 1. Bedrest 1 hour    Albin Felling, MD  Vascular & Interventional Radiology  09/24/2022 10:39 AM

## 2022-09-24 NOTE — Progress Notes (Signed)
Patient clinically stable post BMB per Dr Denna Haggard, local anesthetic along with Fentanyl 25 mcg IV given for procedure secondarily to respiratory status. Report given to [facility] post procedure/330

## 2022-09-24 NOTE — Telephone Encounter (Signed)
I called the number for the daughter and it wanted me to but in a code and I do not have one. I got a call back from her and she will be taking the pt. To cancer center and I told her the date 12/7 at

## 2022-09-24 NOTE — Progress Notes (Signed)
   Heart Failure Nurse Navigator Note  Met with patient today.  She is laying in bed getting ready to eat lunch. She states she is being discharged home.  Discussed  follow-up on Monday, December 4 in the outpatient heart failure clinic, had offered to move the appointment but she states that she would rather keep it for Monday.  Also stressed the importance of being compliant with her water pill along with her fluid restriction of no more than 64 ounces.  She voices understanding.  She states she has no further questions.  Pricilla Riffle RN CHFN

## 2022-09-24 NOTE — Discharge Summary (Signed)
Physician Discharge Summary   Patient: Natasha [name] MRN: [id number] DOB: 05-Mar-1946  Admit date:     09/18/2022  Discharge date: 09/24/22  Discharge Physician: Fritzi Mandes   PCP: [name], NP   Recommendations at discharge:   follow-up with Dr. 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   Since the recent hospitalization, she was started on acalabrutinib and had received blood transfusions; most recently, she was transfused 2 units of RBC on 09/14/2022.     Acute on chronic heart failure with preserved ejection fraction (HFpEF) ([place]) Will resume home cardiac meds  Iron deficiency anemia --Received 2 units of packed red blood cells during this hospital course.  -- IV Venofer given on 11/26.   --Continue to monitor blood counts.  -- Hemoglobin 7.8  --CT scan of the abdomen negative for retroperitoneal bleed.   Recurrent pleural effusion Right greater than left.  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   Pt to get oral chemo per order by Dr Janese Banks. She is s/p CT guided bone marrow biopsy by IR on 12/1   Hypothyroidism Continue Synthroid   Thrombocytopenia ([place]) Likely secondary to lymphoma.   Chronic respiratory failure with hypoxia (HCC) Chronically wears oxygen.   Chronic obstructive pulmonary  disease (COPD) (HCC) Continue inhalers.   Type 2 diabetes mellitus with proteinuria (HCC) Patient on sliding scale insulin.   Barrier to discharge social issue with caregiver at home per [facility] APS investigation pending. [employer] has been involved.   pt will d/c to home today and APS will f/u at home. D/w pt, dter  CSW lead Warehouse manager aware   Procedures:BM bx Family communication :none Consults :oncology CODE STATUS: full DVT Prophylaxis :scd Level of care: Med-Surg      Disposition: Home health Diet recommendation:  Discharge Diet Orders (From admission, onward)     Start     Ordered   09/24/22 0000  Diet - low sodium heart healthy        09/24/22 1154           Cardiac and Carb modified diet DISCHARGE MEDICATION: Allergies as of 09/24/2022   No Known Allergies      Medication List     STOP taking these medications    insulin regular 100 units/mL injection Commonly known as: NOVOLIN R       TAKE these medications    acyclovir 400 MG tablet Commonly known as: ZOVIRAX Take 400 mg by mouth daily.   albuterol 108 (90 Base) MCG/ACT inhaler Commonly known as: VENTOLIN HFA TAKE 2 PUFFS BY MOUTH EVERY 6 HOURS AS NEEDED FOR WHEEZE OR SHORTNESS OF BREATH   Calquence 100 MG tablet Generic drug: acalabrutinib maleate Take 1 tablet (100 mg total) by mouth 2 (two) times daily.   clobetasol cream 0.05 % Commonly known as: TEMOVATE Apply 1 Application topically daily.   furosemide 20 MG tablet Commonly known as: LASIX Take 1 tablet (  20 mg total) by mouth daily.   insulin NPH-regular Human (70-30) 100 UNIT/ML injection Inject 5 Units into the skin 2 (two) times daily with a meal. Use only for hyperglycemia, CBG+ 300. Start with 5 units, titrate up to max 10 unit per dose if need. What changed:  how much to take additional instructions   ipratropium-albuterol 0.5-2.5 (3) MG/3ML Soln Commonly known as: DUONEB Take 3 mLs by nebulization every 6 (six) hours as  needed (shortness of breath or wheezing). What changed: Another medication with the same name was removed. Continue taking this medication, and follow the directions you see here.   levothyroxine 50 MCG tablet Commonly known as: SYNTHROID Take 1 tablet (50 mcg total) by mouth daily at 6 (six) AM.   lidocaine-prilocaine cream Commonly known as: EMLA Apply to affected area once What changed:  how much to take additional instructions   loperamide 2 MG capsule Commonly known as: IMODIUM Take 2 mg by mouth as needed for diarrhea or loose stools.   nicotine 21 mg/24hr patch Commonly known as: NICODERM CQ - dosed in mg/24 hours Place 21 mg onto the skin daily.   ondansetron 8 MG tablet Commonly known as: ZOFRAN Take 8 mg by mouth every 8 (eight) hours as needed for nausea, vomiting or refractory nausea / vomiting. Take 1 tablet q6H for 4 days after chemotherapy.   prochlorperazine 10 MG tablet Commonly known as: COMPAZINE Take 1 tablet by mouth every 6 (six) hours as needed for nausea or vomiting. Take 1 tab q6H for 4 days after chemotherapy.   simethicone 80 MG chewable tablet Commonly known as: MYLICON Chew 268 mg by mouth every 6 (six) hours as needed for flatulence. Take 1 capsule PO PRN gas relief.               Durable Medical Equipment  (From admission, onward)           Start     Ordered   09/19/22 1602  For home use only DME Walker rolling  Once       Question Answer Comment  Walker: With [place]   Patient needs a walker to treat with the following condition Unsteady gait when walking      09/19/22 1601            Follow-up Information     [name], NP Follow up in 5 day(s).   Specialties: Nurse Practitioner, Family Medicine Why: Office is going to call patient to schedule follow up appt Contact information: [place] [address] [phone]         Sindy Guadeloupe, MD. Go on 10/05/2022.   Specialty:  Oncology Why: @ 10:30am Contact information: [place] [address] [phone]                    Condition at discharge: fair  The results of significant diagnostics from this hospitalization (including imaging, microbiology, ancillary and laboratory) are listed below for reference.   Imaging Studies: DG Chest Port 1 View  Result Date: 09/20/2022 CLINICAL DATA:  Status post right thoracentesis EXAM: PORTABLE CHEST 1 VIEW COMPARISON:  None Available. FINDINGS: There is a right-sided chest port with catheter tip terminating near the superior cavoatrial junction. Stable appearance of the cardiomediastinal silhouette. Bilateral moderate pleural effusions, slightly improved on the right. No findings of pneumothorax. Scattered dependent hazy opacities, likely reflective of underlying passive atelectasis. Osseous structures are unremarkable. IMPRESSION: Bilateral pleural effusions, slightly  improved on the right side following thoracentesis. No findings of pneumothorax. Electronically Signed   By: Albin Felling M.D.   On: 09/20/2022 13:25   US THORACENTESIS ASP PLEURAL SPACE W/IMG GUIDE  Result Date: 09/20/2022 INDICATION: 76 year old female. History of gastric lymphoma with recurrent pleural effusions. Currently admitted for shortness of breath. Found to have bilateral pleural effusions right greater than left. Request is for therapeutic and diagnostic thoracentesis EXAM: ULTRASOUND GUIDED RIGHT-SIDED THERAPEUTIC AND DIAGNOSTIC THORACENTESIS MEDICATIONS: Lidocaine 1% 10 mL COMPLICATIONS: None immediate. PROCEDURE: An ultrasound guided thoracentesis was thoroughly discussed with the patient and questions answered. The benefits, risks, alternatives and complications were also discussed. The patient understands and wishes to proceed with the procedure. Written consent was obtained. Ultrasound was performed to localize and mark an adequate pocket of fluid in the right  chest. The area was then prepped and draped in the normal sterile fashion. 1% Lidocaine was used for local anesthesia. Under ultrasound guidance a 6 Fr Safe-T-Centesis catheter was introduced. Thoracentesis was performed. The catheter was removed and a dressing applied. FINDINGS: A total of approximately 1.2 L of amber colored fluid was removed. Samples were sent to the laboratory as requested by the clinical team. IMPRESSION: Successful ultrasound guided therapeutic and diagnostic right-sided thoracentesis yielding 1.2 L of pleural fluid. Read by: Rushie Nyhan, NP Electronically Signed   By: Albin Felling M.D.   On: 09/20/2022 13:22   CT ABDOMEN PELVIS WO CONTRAST  Result Date: 09/19/2022 CLINICAL DATA:  Abdominal pain, acute, nonlocalized r/o retroperitoneal bleed. Lymphoma EXAM: CT ABDOMEN AND PELVIS WITHOUT CONTRAST TECHNIQUE: Multidetector CT imaging of the abdomen and pelvis was performed following the standard protocol without IV contrast. RADIATION DOSE REDUCTION: This exam was performed according to the departmental dose-optimization program which includes automated exposure control, adjustment of the mA and/or kV according to patient size and/or use of iterative reconstruction technique. COMPARISON:  MRI lumbar spine 12/20/2019. FINDINGS: Lower chest: Small to moderate right and small left pleural effusions that are partially visualized. Partial collapse of the right lower lobe and passive atelectasis of left lower lobe. Partially visualized central venous catheter with tip terminating at superior caval junction. Cardiac findings suggestive of anemia. Hepatobiliary: No focal liver abnormality. Calcified gallstone noted within the gallbladder lumen. No gallbladder wall thickening or pericholecystic fluid. No biliary dilatation. Pancreas: No focal lesion. Normal pancreatic contour. No surrounding inflammatory changes. No main pancreatic ductal dilatation. Spleen: Normal in size without focal  abnormality. Adrenals/Urinary Tract: No adrenal nodule bilaterally. Punctate right nephrolithiasis. No left nephrolithiasis. No hydronephrosis. No definite contour-deforming renal mass. No ureterolithiasis or hydroureter. The urinary bladder is unremarkable. Stomach/Bowel: Stomach is within normal limits. No evidence of bowel wall thickening or dilatation. Appendix appears normal. Vascular/Lymphatic: No abdominal aorta or iliac aneurysm. At least moderate atherosclerotic plaque of the aorta and its branches. Markedly difficult to delineate on this noncontrast study retroperitoneal soft tissue densities and mesenteric soft tissue densities likely representing marked adenopathy. Bilateral pelvic sidewall and inguinal lymphadenopathy: As an example a 1.9 cm right inguinal lymph node (2:86). Reproductive: Uterus and bilateral adnexa are unremarkable. Other: No intraperitoneal free fluid. No intraperitoneal free gas. No organized fluid collection. Musculoskeletal: No abdominal wall hernia or abnormality. Subcutaneus soft tissue edema. No suspicious lytic or blastic osseous lesions. No acute displaced fracture. Chronic L2 and L3 compression fractures. IMPRESSION: 1. Markedly limited evaluation on this noncontrast study. No definite large retroperitoneal bleed. 2. Difficult to delineate on this noncontrast study retroperitoneal soft tissue densities and mesenteric soft tissue  densities representing marked conglomerative lymphadenopathy as well as bilateral pelvic sidewall and inguinal lymphadenopathy consistent with known history of versus recurrent disease. 3. Marked splenomegaly also consistent with known history of lymphoma versus recurrent disease. 4. Nonobstructive punctate right nephrolithiasis. 5. Cholelithiasis with no definite CT findings cute cholecystitis-limited evaluation on this noncontrast study. Consider right upper quadrant ultrasound for a more sensitive evaluation of the gallbladder peer 6. Small to  moderate right and small left pleural effusions. Electronically Signed   By: Iven Finn M.D.   On: 09/19/2022 20:00   DG Chest 2 View  Result Date: 09/17/2022 CLINICAL DATA:  Shortness of breath EXAM: CHEST - 2 VIEW COMPARISON:  08/30/2022 FINDINGS: Cardiac shadow is within normal limits. Right chest wall port is seen and stable. Bilateral pleural effusions are noted right considerably greater than left increased from the prior exam. Changes of central vascular congestion are noted consistent with CHF. IMPRESSION: Vascular congestion with increasing pleural effusions right greater than left. Electronically Signed   By: Inez Catalina M.D.   On: 09/17/2022 23:16   CT HEAD WO CONTRAST (5MM)  Result Date: 09/11/2022 CLINICAL DATA:  Headache, new onset. EXAM: CT HEAD WITHOUT CONTRAST TECHNIQUE: Contiguous axial images were obtained from the base of the skull through the vertex without intravenous contrast. RADIATION DOSE REDUCTION: This exam was performed according to the departmental dose-optimization program which includes automated exposure control, adjustment of the mA and/or kV according to patient size and/or use of iterative reconstruction technique. COMPARISON:  CT without contrast 07/12/2022 FINDINGS: Brain: No acute infarct, hemorrhage, or mass lesion is present. Mild atrophy and white matter changes are stable. The ventricles are of normal size. No significant extraaxial fluid collection is present. The brainstem and cerebellum are within normal limits. Vascular: No hyperdense vessel or unexpected calcification. Skull: Calvarium is intact. No focal lytic or blastic lesions are present. No significant extracranial soft tissue lesion is present. Sinuses/Orbits: The paranasal sinuses and mastoid air cells are clear. The globes and orbits are within normal limits. Other: Asymmetric enlargement of the parotid gland, right greater than left is similar the prior study. This again raises concern for  lymphomatous involvement. IMPRESSION: 1. No acute intracranial abnormality or significant interval change. 2. Stable mild atrophy and white matter disease. This likely reflects the sequela of chronic microvascular ischemia. 3. Asymmetric enlargement of the parotid gland, right greater than left is similar the prior study. This again raises concern for lymphomatous involvement. Electronically Signed   By: San Morelle M.D.   On: 09/11/2022 12:33   DG Chest Port 1 View  Result Date: 08/30/2022 CLINICAL DATA:  Post thoracentesis.  RIGHT. EXAM: PORTABLE CHEST 1 VIEW COMPARISON:  IR ultrasound, earlier same day. Chest XR, 08/29/2022. FINDINGS: Support lines: Stable positioning of RIGHT chest port with catheter tip at the superior cavoatrial junction. Overlying pacer leads. Cardiac apex is obscured. Mediastinal silhouette is within normal limits. Lungs are hypoinflated with perihilar opacities and bibasilar consolidations. Interval removal of RIGHT pleural effusion, with small volume residual. Small-to-moderate volume LEFT pleural effusion. No pneumothorax. No interval osseous abnormality. IMPRESSION: 1. No pneumothorax status post RIGHT thoracentesis. 2. Bilateral pleural effusions and basilar consolidations, likely atelectasis. Electronically Signed   By: Michaelle Birks M.D.   On: 08/30/2022 12:29   US THORACENTESIS ASP PLEURAL SPACE W/IMG GUIDE  Result Date: 08/30/2022 INDICATION: History of gastric lymphoma with recurrent pleural effusions. Patient presented to ED with complaint of shortness of breath found to have bilateral pleural effusions. Upon ultrasound exam  right greater than left. EXAM: ULTRASOUND GUIDED DIAGNOSTIC AND THERAPEUTIC RIGHT THORACENTESIS MEDICATIONS: 10 mL 1 % lidocaine COMPLICATIONS: None immediate. PROCEDURE: An ultrasound guided thoracentesis was thoroughly discussed with the patient and questions answered. The benefits, risks, alternatives and complications were also discussed.  The patient understands and wishes to proceed with the procedure. Written consent was obtained. Ultrasound was performed to localize and mark an adequate pocket of fluid in the RIGHT chest. The area was then prepped and draped in the normal sterile fashion. 1% Lidocaine was used for local anesthesia. Under ultrasound guidance a 6 Fr Safe-T-Centesis catheter was introduced. Thoracentesis was performed. The catheter was removed and a dressing applied. FINDINGS: A total of approximately 1.2 L of hazy, amber colored fluid was removed. Samples were sent to the laboratory as requested by the clinical team. IMPRESSION: Successful ultrasound guided right thoracentesis yielding 1.2 L of pleural fluid. Read by: Narda Rutherford, AGNP-BC Electronically Signed   By: Michaelle Birks M.D.   On: 08/30/2022 12:25   DG Chest Portable 1 View  Result Date: 08/29/2022 CLINICAL DATA:  Shortness of breath. EXAM: PORTABLE CHEST 1 VIEW COMPARISON:  08/17/2022 FINDINGS: Moderate to large-sized bilateral pleural effusions, significantly increased. Progressive increase in prominence of the pulmonary vasculature and interstitial markings. Less airspace opacity and volume loss at the lung bases. Stable right jugular porta catheter with its tip in the region of the superior cavoatrial junction or upper right atrium. Unremarkable bones. IMPRESSION: 1. Moderate to large-sized bilateral pleural effusions, significantly increased. 2. Progressive changes of congestive heart failure. 3. Mildly improved bibasilar atelectasis. Electronically Signed   By: Claudie Revering M.D.   On: 08/29/2022 14:17    Microbiology: Results for orders placed or performed in visit on 09/20/22  Body fluid culture w Gram Stain     Status: None   Collection Time: 09/20/22 11:24 AM   Specimen: PATH Cytology Pleural fluid  Result Value Ref Range Status   Specimen Description   Final    PLEURAL Performed at [facility], [address], [place], [place] [address]     Special Requests   Final    NONE Performed at [facility], [address] [place], [place] [address]    Gram Stain NO WBC SEEN NO ORGANISMS SEEN   Final   Culture   Final    NO GROWTH 3 DAYS Performed at [facility], [place] [address], [place], [place] [address]    Report Status 09/24/2022 FINAL  Final    Labs: CBC: Recent Labs  Lab 09/18/22 1634 09/19/22 0410 09/20/22 0419 09/21/22 0450 09/22/22 1710 09/24/22 0512  WBC 5.7  5.7 5.0 5.1 3.6* 3.8* 4.8  NEUTROABS 1.0*  --   --   --  0.4* 0.3*  HGB 7.9*  7.9* 7.8* 8.4* 7.8* 8.1* 7.9*  HCT 25.2*  24.7* 24.5* 26.8* 25.3* 26.8* 26.5*  MCV 91.0  90.8 90.1 91.5 93.0 94.4 95.3  PLT 67*  63* 60* 63* 66* 77* 85*   Basic Metabolic Panel: Recent Labs  Lab 09/17/22 2250 09/18/22 1634 09/19/22 0410 09/20/22 0419 09/21/22 0450  NA 138 143 143 141 140  K 3.6 3.6 3.6 3.7 4.3  CL 105 106 106 103 103  CO2 _0 32  GLUCOSE 153* 131* 155* 157* 129*  BUN 50* 47* 46* 46* 40*  CREATININE 0.80 0.95 0.92 1.03* 1.10*  CALCIUM 8.2* 8.4* 8.1* 8.0* 7.8*  MG  --   --  1.7  --   --  Liver Function Tests: Recent Labs  Lab 09/20/22 0419  PROT 4.8*   CBG: Recent Labs  Lab 09/23/22 1659 09/23/22 1942 09/24/22 0728 09/24/22 0944 09/24/22 1146  GLUCAP 111* 185* 106* 111* 111*    Discharge time spent: greater than 30 minutes.  Signed: Fritzi Mandes, MD [facility] 09/24/2022

## 2022-09-24 NOTE — Telephone Encounter (Signed)
It is on 12/7 at 1:45 to have labs and see md and if she needs blood transfusion it will be 12/12 at 8:30/ [name] will also get transportation set up also. She will be with her mom

## 2022-09-24 NOTE — Care Management Important Message (Signed)
Important Message  Patient Details  Name: Natasha [name] MRN: [id number] Date of Birth: 1946/08/07   Medicare Important Message Given:  Yes  Patient out of room upon time of visit. Copy of Medicare IM left in room for reference.   Dannette Barbara 09/24/2022, 11:41 AM

## 2022-09-24 NOTE — Consult Note (Signed)
Chief Complaint: Patient was seen in consultation today for  Chief Complaint  Patient presents with   Shortness of Breath    Referring Physician(s): Dr. Janese Banks   Supervising Physician: Juliet Rude  Patient Status: [facility] - In-pt  History of Present Illness: Natasha [name] is a 76 y.o. female with a medical history significant for COPD, chronic hypoxic respiratory failure, CHF, DM, anemia, thrombocytopenia and marginal zone lymphoma currently receiving treatment. She presented to the ED 09/18/22 with complaints of shortness of breath, leg swelling and fatigue. She was admitted for severe anemia and has received several units of PRBCs. Her hemoglobin level remains low and she has baseline thrombocytopenia and neutropenia. She has never had a bone marrow biopsy due to her hesitancy to undergo procedures. The patient is now agreeable to a bone marrow biopsy. Of note, this patient is familiar to IR from several thoracenteses for recurrent pleural effusions. IR also placed a port for her 08/16/22.   Interventional Radiology has been asked to evaluate this patient for an image-guided bone marrow biopsy with aspiration.   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  Medications: Prior to Admission medications   Medication Sig Start Date End Date Taking? Authorizing Provider  acalabrutinib maleate (CALQUENCE) 100 MG tablet Take 1 tablet (100 mg total) by mouth 2 (two) times daily. 08/30/22  Yes  RSindy Guadeloupe MD  acyclovir (ZOVIRAX) 400 MG tablet Take 400 mg by mouth daily. 08/09/22  Yes [provider]  clobetasol cream (TEMOVATE) 02.70% Apply 1 Application topically daily. 08/25/22  Yes [provider]  furosemide (LASIX) 20 MG tablet Take 1 tablet (20 mg total) by mouth daily. 09/09/22  Yes Hackney, TOtila KluverA, FNP  insulin regular (NOVOLIN R) 100 units/mL injection Inject 5-10 Units into the skin 3 (three) times daily before meals. BG 200-299 give 5 units BG 300-399 give 10 units.   Yes [provider]  Ipratropium-Albuterol (COMBIVENT RESPIMAT) 20-100 MCG/ACT AERS respimat Inhale 1 puff into the lungs 3 (three) times daily.   Yes [provider]  ipratropium-albuterol (DUONEB) 0.5-2.5 (3) MG/3ML SOLN Take 3 mLs by nebulization every 6 (six) hours as needed (shortness of breath or wheezing). 08/22/22  Yes [provider]  levothyroxine (SYNTHROID) 50 MCG tablet Take 1 tablet (50 mcg total) by mouth daily at 6 (six) AM. 07/17/22  Yes ZSharen Hones MD  albuterol (VENTOLIN HFA) 108 (90 Base) MCG/ACT inhaler TAKE 2 PUFFS BY MOUTH EVERY 6 HOURS AS NEEDED FOR WHEEZE OR SHORTNESS OF BREATH 06/17/22   SRalene MuskratB, MD  insulin NPH-regular Human (70-30) 100 UNIT/ML injection Inject 5 Units into the skin 2 (two) times daily with a meal. Use only for hyperglycemia, CBG+ 300. Start with 5 units, titrate up to max 10 unit per dose if need. 09/21/22   WLoletha Grayer MD  lidocaine-prilocaine (EMLA)  cream Apply to affected area once Patient taking differently: 1 Application. Apply to mediport site 2 hours before infusion 07/28/22   Sindy Guadeloupe, MD  loperamide (IMODIUM) 2 MG capsule Take 2 mg by mouth as needed for diarrhea or loose stools.    [provider]  nicotine (NICODERM CQ - DOSED IN MG/24 HOURS) 21 mg/24hr patch Place 21 mg onto the skin daily.    [provider]  ondansetron (ZOFRAN) 8 MG tablet Take 8 mg by mouth every 8  (eight) hours as needed for nausea, vomiting or refractory nausea / vomiting. Take 1 tablet q6H for 4 days after chemotherapy. 08/09/22   [provider]  prochlorperazine (COMPAZINE) 10 MG tablet Take 1 tablet by mouth every 6 (six) hours as needed for nausea or vomiting. Take 1 tab q6H for 4 days after chemotherapy. 08/09/22   [provider]  simethicone (MYLICON) 80 MG chewable tablet Chew 180 mg by mouth every 6 (six) hours as needed for flatulence. Take 1 capsule PO PRN gas relief.    [provider]     Family History  Problem Relation Age of Onset   Breast cancer Mother    Alzheimer's disease Father    Lung cancer Father        lung   Varicose Veins Brother    Heart attack Brother     Social History   Socioeconomic History   Marital status: Widowed    Spouse name: Not on file   Number of children: 2   Years of education: Not on file   Highest education level: 9th grade  Occupational History   Occupation: unemployed  Tobacco Use   Smoking status: Former    Packs/day: 2.00    Years: 55.00    Total pack years: 110.00    Types: Cigarettes    Start date: 07/07/1961    Quit date: 08/02/2022    Years since quitting: 0.1   Smokeless tobacco: Never   Tobacco comments:    1/2 pack she states but family says 2 PPD  Vaping Use   Vaping Use: Never used  Substance and Sexual Activity   Alcohol use: No   Drug use: Never   Sexual activity: Not Currently    Partners: Male    Birth control/protection: Post-menopausal  Other Topics Concern   Not on file  Social History Narrative   08/14/20   From: [place]   Living: to be near daughter   Work: retired      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      Enjoys: stays at home      Exercise: walking to her daughter's store   Diet: eats fruit, air fried chicken      Safety   Seat belts: Yes    Guns: No   Safe in relationships: Yes     Social Determinants of Health   Financial Resource Strain: Medium Risk (08/24/2022)   Overall Financial Resource Strain (CARDIA)    Difficulty of Paying Living Expenses: Somewhat hard  Food Insecurity: No Food Insecurity (09/18/2022)   Hunger Vital Sign    Worried About Running Out of Food in the Last Year: Never true    Ran Out of Food in the Last Year: Never true  Recent Concern: Food Insecurity - Food Insecurity Present (08/30/2022)   Hunger Vital Sign    Worried About Running Out of Food in the Last Year: Sometimes true  Ran Out of Food in the Last Year: Sometimes true  Transportation Needs: No Transportation Needs (09/18/2022)   PRAPARE - Hydrologist (Medical): No    Lack of Transportation (Non-Medical): No  Recent Concern: Transportation Needs - Unmet Transportation Needs (08/30/2022)   PRAPARE - Hydrologist (Medical): Yes    Lack of Transportation (Non-Medical): No  Physical Activity: Inactive (08/24/2022)   Exercise Vital Sign    Days of Exercise per Week: 0 days    Minutes of Exercise per Session: 0 min  Stress: Stress Concern Present (08/24/2022)   [place]    Feeling of Stress : To some extent  Social Connections: Socially Isolated (08/24/2022)   Social Connection and Isolation Panel [NHANES]    Frequency of Communication with Friends and Family: More than three times a week    Frequency of Social Gatherings with Friends and Family: More than three times a week    Attends Religious Services: Never    Marine scientist or Organizations: No    Attends Archivist Meetings: Never    Marital Status: Widowed    Review of Systems: A 12 point ROS discussed and pertinent positives are indicated in the HPI above.  All other systems are negative.  Review of Systems  Constitutional:  Negative for appetite change and fatigue.   Respiratory:  Negative for cough and shortness of breath.   Gastrointestinal:  Negative for abdominal pain, diarrhea, nausea and vomiting.  Neurological:  Negative for dizziness and headaches.    Vital Signs: BP (!) 92/51   Pulse 83   Temp 98.4 F (36.9 C)   Resp 18   Ht 5' 5" (1.651 m)   Wt 151 lb 14.4 oz (68.9 kg)   SpO2 96%   BMI 25.28 kg/m   Physical Exam Constitutional:      General: She is not in acute distress.    Appearance: She is not ill-appearing.  HENT:     Mouth/Throat:     Mouth: Mucous membranes are moist.     Pharynx: Oropharynx is clear.  Cardiovascular:     Rate and Rhythm: Normal rate and regular rhythm.     Pulses: Normal pulses.  Pulmonary:     Effort: Pulmonary effort is normal.  Abdominal:     General: Bowel sounds are normal.     Palpations: Abdomen is soft.     Tenderness: There is no abdominal tenderness.  Skin:    General: Skin is warm and dry.  Neurological:     Mental Status: She is alert and oriented to person, place, and time.     Imaging: DG Chest Port 1 View  Result Date: 09/20/2022 CLINICAL DATA:  Status post right thoracentesis EXAM: PORTABLE CHEST 1 VIEW COMPARISON:  None Available. FINDINGS: There is a right-sided chest port with catheter tip terminating near the superior cavoatrial junction. Stable appearance of the cardiomediastinal silhouette. Bilateral moderate pleural effusions, slightly improved on the right. No findings of pneumothorax. Scattered dependent hazy opacities, likely reflective of underlying passive atelectasis. Osseous structures are unremarkable. IMPRESSION: Bilateral pleural effusions, slightly improved on the right side following thoracentesis. No findings of pneumothorax. Electronically Signed   By: Albin Felling M.D.   On: 09/20/2022 13:25   US THORACENTESIS ASP PLEURAL SPACE W/IMG GUIDE  Result Date: 09/20/2022 INDICATION: 76 year old female. History of gastric lymphoma with recurrent pleural  effusions. Currently admitted for shortness  of breath. Found to have bilateral pleural effusions right greater than left. Request is for therapeutic and diagnostic thoracentesis EXAM: ULTRASOUND GUIDED RIGHT-SIDED THERAPEUTIC AND DIAGNOSTIC THORACENTESIS MEDICATIONS: Lidocaine 1% 10 mL COMPLICATIONS: None immediate. PROCEDURE: An ultrasound guided thoracentesis was thoroughly discussed with the patient and questions answered. The benefits, risks, alternatives and complications were also discussed. The patient understands and wishes to proceed with the procedure. Written consent was obtained. Ultrasound was performed to localize and mark an adequate pocket of fluid in the right chest. The area was then prepped and draped in the normal sterile fashion. 1% Lidocaine was used for local anesthesia. Under ultrasound guidance a 6 Fr Safe-T-Centesis catheter was introduced. Thoracentesis was performed. The catheter was removed and a dressing applied. FINDINGS: A total of approximately 1.2 L of amber colored fluid was removed. Samples were sent to the laboratory as requested by the clinical team. IMPRESSION: Successful ultrasound guided therapeutic and diagnostic right-sided thoracentesis yielding 1.2 L of pleural fluid. Read by: Rushie Nyhan, NP Electronically Signed   By: Albin Felling M.D.   On: 09/20/2022 13:22   CT ABDOMEN PELVIS WO CONTRAST  Result Date: 09/19/2022 CLINICAL DATA:  Abdominal pain, acute, nonlocalized r/o retroperitoneal bleed. Lymphoma EXAM: CT ABDOMEN AND PELVIS WITHOUT CONTRAST TECHNIQUE: Multidetector CT imaging of the abdomen and pelvis was performed following the standard protocol without IV contrast. RADIATION DOSE REDUCTION: This exam was performed according to the departmental dose-optimization program which includes automated exposure control, adjustment of the mA and/or kV according to patient size and/or use of iterative reconstruction technique. COMPARISON:  MRI lumbar spine  12/20/2019. FINDINGS: Lower chest: Small to moderate right and small left pleural effusions that are partially visualized. Partial collapse of the right lower lobe and passive atelectasis of left lower lobe. Partially visualized central venous catheter with tip terminating at superior caval junction. Cardiac findings suggestive of anemia. Hepatobiliary: No focal liver abnormality. Calcified gallstone noted within the gallbladder lumen. No gallbladder wall thickening or pericholecystic fluid. No biliary dilatation. Pancreas: No focal lesion. Normal pancreatic contour. No surrounding inflammatory changes. No main pancreatic ductal dilatation. Spleen: Normal in size without focal abnormality. Adrenals/Urinary Tract: No adrenal nodule bilaterally. Punctate right nephrolithiasis. No left nephrolithiasis. No hydronephrosis. No definite contour-deforming renal mass. No ureterolithiasis or hydroureter. The urinary bladder is unremarkable. Stomach/Bowel: Stomach is within normal limits. No evidence of bowel wall thickening or dilatation. Appendix appears normal. Vascular/Lymphatic: No abdominal aorta or iliac aneurysm. At least moderate atherosclerotic plaque of the aorta and its branches. Markedly difficult to delineate on this noncontrast study retroperitoneal soft tissue densities and mesenteric soft tissue densities likely representing marked adenopathy. Bilateral pelvic sidewall and inguinal lymphadenopathy: As an example a 1.9 cm right inguinal lymph node (2:86). Reproductive: Uterus and bilateral adnexa are unremarkable. Other: No intraperitoneal free fluid. No intraperitoneal free gas. No organized fluid collection. Musculoskeletal: No abdominal wall hernia or abnormality. Subcutaneus soft tissue edema. No suspicious lytic or blastic osseous lesions. No acute displaced fracture. Chronic L2 and L3 compression fractures. IMPRESSION: 1. Markedly limited evaluation on this noncontrast study. No definite large  retroperitoneal bleed. 2. Difficult to delineate on this noncontrast study retroperitoneal soft tissue densities and mesenteric soft tissue densities representing marked conglomerative lymphadenopathy as well as bilateral pelvic sidewall and inguinal lymphadenopathy consistent with known history of versus recurrent disease. 3. Marked splenomegaly also consistent with known history of lymphoma versus recurrent disease. 4. Nonobstructive punctate right nephrolithiasis. 5. Cholelithiasis with no definite CT findings cute cholecystitis-limited evaluation on this noncontrast  study. Consider right upper quadrant ultrasound for a more sensitive evaluation of the gallbladder peer 6. Small to moderate right and small left pleural effusions. Electronically Signed   By: Iven Finn M.D.   On: 09/19/2022 20:00   DG Chest 2 View  Result Date: 09/17/2022 CLINICAL DATA:  Shortness of breath EXAM: CHEST - 2 VIEW COMPARISON:  08/30/2022 FINDINGS: Cardiac shadow is within normal limits. Right chest wall port is seen and stable. Bilateral pleural effusions are noted right considerably greater than left increased from the prior exam. Changes of central vascular congestion are noted consistent with CHF. IMPRESSION: Vascular congestion with increasing pleural effusions right greater than left. Electronically Signed   By: Inez Catalina M.D.   On: 09/17/2022 23:16   CT HEAD WO CONTRAST (5MM)  Result Date: 09/11/2022 CLINICAL DATA:  Headache, new onset. EXAM: CT HEAD WITHOUT CONTRAST TECHNIQUE: Contiguous axial images were obtained from the base of the skull through the vertex without intravenous contrast. RADIATION DOSE REDUCTION: This exam was performed according to the departmental dose-optimization program which includes automated exposure control, adjustment of the mA and/or kV according to patient size and/or use of iterative reconstruction technique. COMPARISON:  CT without contrast 07/12/2022 FINDINGS: Brain: No acute  infarct, hemorrhage, or mass lesion is present. Mild atrophy and white matter changes are stable. The ventricles are of normal size. No significant extraaxial fluid collection is present. The brainstem and cerebellum are within normal limits. Vascular: No hyperdense vessel or unexpected calcification. Skull: Calvarium is intact. No focal lytic or blastic lesions are present. No significant extracranial soft tissue lesion is present. Sinuses/Orbits: The paranasal sinuses and mastoid air cells are clear. The globes and orbits are within normal limits. Other: Asymmetric enlargement of the parotid gland, right greater than left is similar the prior study. This again raises concern for lymphomatous involvement. IMPRESSION: 1. No acute intracranial abnormality or significant interval change. 2. Stable mild atrophy and white matter disease. This likely reflects the sequela of chronic microvascular ischemia. 3. Asymmetric enlargement of the parotid gland, right greater than left is similar the prior study. This again raises concern for lymphomatous involvement. Electronically Signed   By: San Morelle M.D.   On: 09/11/2022 12:33   DG Chest Port 1 View  Result Date: 08/30/2022 CLINICAL DATA:  Post thoracentesis.  RIGHT. EXAM: PORTABLE CHEST 1 VIEW COMPARISON:  IR ultrasound, earlier same day. Chest XR, 08/29/2022. FINDINGS: Support lines: Stable positioning of RIGHT chest port with catheter tip at the superior cavoatrial junction. Overlying pacer leads. Cardiac apex is obscured. Mediastinal silhouette is within normal limits. Lungs are hypoinflated with perihilar opacities and bibasilar consolidations. Interval removal of RIGHT pleural effusion, with small volume residual. Small-to-moderate volume LEFT pleural effusion. No pneumothorax. No interval osseous abnormality. IMPRESSION: 1. No pneumothorax status post RIGHT thoracentesis. 2. Bilateral pleural effusions and basilar consolidations, likely atelectasis.  Electronically Signed   By: Michaelle Birks M.D.   On: 08/30/2022 12:29   US THORACENTESIS ASP PLEURAL SPACE W/IMG GUIDE  Result Date: 08/30/2022 INDICATION: History of gastric lymphoma with recurrent pleural effusions. Patient presented to ED with complaint of shortness of breath found to have bilateral pleural effusions. Upon ultrasound exam right greater than left. EXAM: ULTRASOUND GUIDED DIAGNOSTIC AND THERAPEUTIC RIGHT THORACENTESIS MEDICATIONS: 10 mL 1 % lidocaine COMPLICATIONS: None immediate. PROCEDURE: An ultrasound guided thoracentesis was thoroughly discussed with the patient and questions answered. The benefits, risks, alternatives and complications were also discussed. The patient understands and wishes to proceed with the  procedure. Written consent was obtained. Ultrasound was performed to localize and mark an adequate pocket of fluid in the RIGHT chest. The area was then prepped and draped in the normal sterile fashion. 1% Lidocaine was used for local anesthesia. Under ultrasound guidance a 6 Fr Safe-T-Centesis catheter was introduced. Thoracentesis was performed. The catheter was removed and a dressing applied. FINDINGS: A total of approximately 1.2 L of hazy, amber colored fluid was removed. Samples were sent to the laboratory as requested by the clinical team. IMPRESSION: Successful ultrasound guided right thoracentesis yielding 1.2 L of pleural fluid. Read by: Narda Rutherford, AGNP-BC Electronically Signed   By: Michaelle Birks M.D.   On: 08/30/2022 12:25   DG Chest Portable 1 View  Result Date: 08/29/2022 CLINICAL DATA:  Shortness of breath. EXAM: PORTABLE CHEST 1 VIEW COMPARISON:  08/17/2022 FINDINGS: Moderate to large-sized bilateral pleural effusions, significantly increased. Progressive increase in prominence of the pulmonary vasculature and interstitial markings. Less airspace opacity and volume loss at the lung bases. Stable right jugular porta catheter with its tip in the region of the  superior cavoatrial junction or upper right atrium. Unremarkable bones. IMPRESSION: 1. Moderate to large-sized bilateral pleural effusions, significantly increased. 2. Progressive changes of congestive heart failure. 3. Mildly improved bibasilar atelectasis. Electronically Signed   By: Claudie Revering M.D.   On: 08/29/2022 14:17    Labs:  CBC: Recent Labs    09/20/22 0419 09/21/22 0450 09/22/22 1710 09/24/22 0512  WBC 5.1 3.6* 3.8* 4.8  HGB 8.4* 7.8* 8.1* 7.9*  HCT 26.8* 25.3* 26.8* 26.5*  PLT 63* 66* 77* 85*    COAGS: Recent Labs    08/29/22 1325  INR 1.2  APTT 35    BMP: Recent Labs    09/18/22 1634 09/19/22 0410 09/20/22 0419 09/21/22 0450  NA 143 143 141 140  K 3.6 3.6 3.7 4.3  CL 106 106 103 103  CO2 _0 32  GLUCOSE 131* 155* 157* 129*  BUN 47* 46* 46* 40*  CALCIUM 8.4* 8.1* 8.0* 7.8*  CREATININE 0.95 0.92 1.03* 1.10*  GFRNONAA >60 >60 57* 53*    LIVER FUNCTION TESTS: Recent Labs    08/04/22 0542 08/29/22 1325 09/07/22 0853 09/14/22 1159 09/20/22 0419  BILITOT 1.7* 1.6* 1.3* 1.2  --   AST _1 --   ALT _2 --   ALKPHOS 117 174* 130* 145*  --   PROT 4.7* 5.4* 4.8* 5.0* 4.8*  ALBUMIN 2.7* 3.1* 2.8* 3.0*  --     TUMOR MARKERS: No results for input(s): "AFPTM", "CEA", "CA199", "CHROMGRNA" in the last 8760 hours.  Assessment and Plan:  Marginal Zone Lymphoma: Natasha [name], 76 year old female, is scheduled today for an image-guided bone marrow biopsy with aspiration.   Risks and benefits of this procedure were discussed with the patient and/or patient's family including, but not limited to bleeding, infection, damage to adjacent structures or low yield requiring additional tests.  All of the questions were answered and there is agreement to proceed. She has been NPO.   Consent signed and in chart.  Thank you for this interesting consult.  I greatly enjoyed meeting Shanequia Kendrick Chojnowski and look forward to participating in their  care.  A copy of this report was sent to the requesting provider on this date.  Electronically Signed: Soyla Dryer, AGACNP-BC [phone] 09/24/2022, 8:31 AM   I spent a total of 20 Minutes    in face to face  in clinical consultation, greater than 50% of which was counseling/coordinating care for bone marrow biopsy with aspiration.

## 2022-09-26 DIAGNOSIS — R1010 Upper abdominal pain, unspecified: Secondary | ICD-10-CM | POA: Diagnosis not present

## 2022-09-26 NOTE — Progress Notes (Deleted)
Patient ID: Natasha [name], female    DOB: 29-Aug-1946, 76 y.o.   MRN: [id number]  HPI  Ms Valliant is a 76 y/o female with a history of gastric lymphoma, DM, anemia, COPD, hypotension, pleural effusion, tobacco use and chronic heart failure.   Echo report from 07/13/22 reviewed and showed an EF of 60-65% along with mildly elevated PA pressure, mild MR and mild/moderate TR.   Admitted 09/18/22 due to increased shortness of breath, leg swelling, and fatigue due to acute on chronic HF. Has 2 units of blood 4 days prior to admission and 2 units in this admission. Thoracentesis removed 1.2 L underneath the right lung. Recently started on acalabrutinib for her lymphoma. Discharged after 6 days. Was in the ED 09/11/22 due to headache where she was evaluated and released. Admitted 08/29/22 due to SOB due to COPD/ HF exacerbation. Thoracentesis done. Given IV lasix. Oncology consult obtained. Discharged after 2 days. Was in the ED 08/23/22 and had an admission on 08/16/22 due to COPD exacerbation. Discharged the following day. Admitted 08/02/22 due to worsening shortness of breath. Recently treated for covid with paxlovid. Thoracentesis done with removal of 1L. Initially had increased oxygen requirement but then weaned back to baseline of 3L. Given 1 unit PRBC's. Lasix held due to AKI. Discharged after 2 days.   She presents today for a hospital f/u visit with a chief complaint of   Currently wearing oxygen at 4L around the clock although takes it off at home when going to the bathroom. 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   CHF (congestive heart failure) (HCC)    COPD (chronic obstructive pulmonary disease) ([place])    Diabetes mellitus without complication (HCC)    Hypotension    Pleural effusion    [facility]Mc 864m,  2 weeks ago   Vaginal delivery    x 5   Past Surgical  History:  Procedure Laterality Date   IR IMAGING GUIDED PORT INSERTION  08/16/2022   TONSILLECTOMY Bilateral    as a child   Family History  Problem Relation Age of Onset   Breast cancer Mother    Alzheimer's disease Father    Lung cancer Father        lung   Varicose Veins Brother    Heart attack Brother    Social History   Tobacco Use   Smoking status: Former    Packs/day: 2.00    Years: 55.00    Total pack years: 110.00    Types: Cigarettes    Start date: 07/07/1961    Quit date: 08/02/2022    Years since quitting: 0.1   Smokeless tobacco: Never   Tobacco comments:    1/2 pack she states but family says 2 PPD  Substance Use Topics   Alcohol use: No   No Known Allergies    Review of Systems  Constitutional:  Positive for fatigue (minimal). Negative for appetite change.  HENT:  Negative for congestion, postnasal drip and sore throat.   Eyes: Negative.   Respiratory:  Positive for cough and shortness of breath (easily). Negative for chest tightness and wheezing.   Cardiovascular:  Positive for leg swelling. Negative for chest pain and palpitations.  Gastrointestinal:  Positive for abdominal distention. Negative for abdominal pain.  Endocrine: Negative.  Genitourinary: Negative.   Musculoskeletal:  Negative for back pain and neck pain.  Skin: Negative.   Allergic/Immunologic: Negative.   Neurological:  Negative for dizziness and light-headedness.  Hematological:  Negative for adenopathy. Does not bruise/bleed easily.  Psychiatric/Behavioral:  Negative for dysphoric mood and sleep disturbance (sleeping on 2 pillows). The patient is not nervous/anxious.      Physical Exam Vitals and nursing note reviewed. Exam conducted with a chaperone present (caregiver).  Constitutional:      Appearance: Normal appearance.  HENT:     Head: Normocephalic and atraumatic.  Cardiovascular:     Rate and Rhythm: Normal rate and regular rhythm.  Pulmonary:     Effort: Pulmonary  effort is normal. No respiratory distress.     Breath sounds: No wheezing or rales.  Abdominal:     General: There is distension.     Palpations: Abdomen is soft.     Tenderness: There is no abdominal tenderness.  Musculoskeletal:        General: No tenderness.     Cervical back: Normal range of motion and neck supple.     Right lower leg: Edema (2+ pitting) present.     Left lower leg: Edema (2+ pitting) present.  Skin:    General: Skin is warm and dry.  Neurological:     Mental Status: She is alert and oriented to person, place, and time. Mental status is at baseline.  Psychiatric:        Mood and Affect: Mood normal.        Behavior: Behavior normal.        Thought Content: Thought content normal.   Assessment & Plan:  1: Chronic heart failure with preserved ejection fraction without structural changes- - NYHA class III - fluid overloaded today with weight gain and worsening edema - weighing daily; reminded to call for an overnight weight gain of > 2 pounds or a weekly weight gain of > 5 pounds - weight 164 pounds from last visit here 2 weeks ago - not adding salt but unclear if eating high sodium foods - reviewed keeping daily sodium intake to 60 ounces and most of this should be water and decaf coffee - encouraged to elevate her legs when she's sitting for long periods of time; she said that she tends to sit with her legs down - BNP 09/17/22 was 150.8  2: Hypotension- - BP - saw PCP [facility]) 09/10/22 - BMP 09/21/22 reviewed and showed sodium 140, potassium 4.3, creatinine 1.1 and GFR 53  3: COPD- - wearing oxygen at 4L around the clock - has not smoked since recent admission - continued cessation discussed  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

## 2022-09-27 ENCOUNTER — Ambulatory Visit: Payer: Medicare Other | Admitting: Family

## 2022-09-27 ENCOUNTER — Telehealth: Payer: Self-pay | Admitting: Family

## 2022-09-27 ENCOUNTER — Ambulatory Visit: Payer: Self-pay

## 2022-09-27 ENCOUNTER — Other Ambulatory Visit (HOSPITAL_COMMUNITY): Payer: Self-pay

## 2022-09-27 NOTE — Patient Outreach (Signed)
  Care Coordination   09/27/2022 Name: Natasha [name] MRN: [id number] DOB: 1946-09-17   Care Coordination Outreach Attempts:  An unsuccessful telephone outreach was attempted for a scheduled appointment today. Telephone call attempt to patients daughter/ DPR, Lenon Oms.  Unable to reach.  HIPAA compliant message left with call back phone number.   Follow Up Plan:  Additional outreach attempts will be made to offer the patient care coordination information and services.   Encounter Outcome:  No Answer   Care Coordination Interventions:  No, not indicated    Quinn Plowman [facility] [place] [phone] direct line

## 2022-09-27 NOTE — Telephone Encounter (Signed)
Patient did not show for her [facility] appointment on 09/27/22. Will attempt to reschedule.

## 2022-09-28 ENCOUNTER — Other Ambulatory Visit: Payer: Medicare Other

## 2022-09-28 ENCOUNTER — Encounter: Payer: Self-pay | Admitting: Oncology

## 2022-09-28 ENCOUNTER — Encounter: Payer: Self-pay | Admitting: Family

## 2022-09-28 ENCOUNTER — Telehealth: Payer: Self-pay | Admitting: *Deleted

## 2022-09-28 DIAGNOSIS — R1010 Upper abdominal pain, unspecified: Secondary | ICD-10-CM | POA: Diagnosis not present

## 2022-09-28 LAB — SURGICAL PATHOLOGY

## 2022-09-28 NOTE — Telephone Encounter (Signed)
I called daughter [name] and got her voicemail and I left a message that [name] had called today and sent my chart message about the  patient and wants [place] to call him back. Per Dr. Janese Banks since pt being in hospital we are not able to speak to [name] about your mom. I left my phone number that she can call back to.

## 2022-09-28 NOTE — Telephone Encounter (Signed)
We will need to get in touch with patient directly or daughter since APS is involved in her case. I am seeing on Friday if she can wait until then or ask daughter about seeing smc today or tomorrow

## 2022-09-29 ENCOUNTER — Ambulatory Visit: Payer: Self-pay

## 2022-09-29 ENCOUNTER — Telehealth: Payer: Self-pay

## 2022-09-29 ENCOUNTER — Other Ambulatory Visit (HOSPITAL_COMMUNITY): Payer: Self-pay

## 2022-09-29 DIAGNOSIS — R1010 Upper abdominal pain, unspecified: Secondary | ICD-10-CM | POA: Diagnosis not present

## 2022-09-29 DIAGNOSIS — Z139 Encounter for screening, unspecified: Secondary | ICD-10-CM

## 2022-09-29 NOTE — Patient Outreach (Signed)
  Care Coordination   Follow Up Visit Note   09/29/2022 Name: Natasha [name] MRN: [id number] DOB: [birth year]  Natasha [name] is a 76 y.o. year old female who sees Natasha Lo, NP for primary care. I  spoke with Natasha [name] patients daughter and designated party release.   What matters to the patients health and wellness today?  Daughter states patients home health has not started.  Confirmed patient discharged from the hospital on 09/24/22.    Call made by this RNCM to [facility] home health. Spoke with Natasha [name], Clinical manager regarding patients start of care date.  She states she was unaware that patient discharged from the hospital on 09/24/22 and had not received resumption of care orders.  Natasha [name] states she will follow up on this immediately and return call to this RNCM.   Return call received from Natasha [name] who states she has resumption of care request and has arranged for nursing services to start on tomorrow 09/30/22.  She states patient will get a call today to coordinate time for tomorrows visit.  RNCM contacted daughter to inform her that home health services will begin on tomorrow and either she or patient will get a call to day from home health to coordinate visit time.   Goals Addressed             This Visit's Progress    Patient / caregiver stated:  Management of health conditions       Care Coordination Interventions: Daughter advised to set up patients transportation for oncology appointment on 10/01/22.  Confirmed with daughter that she will accompany patient to this visit [facility] home [facility] to determine start of care date for patient. Daughter states home health services have not started.  Confirmed with daughter that patient has all prescribed medications post hospital discharge and is taking them as prescribed Confirmed patient has assistance with her meals.  Daughter states family friend, Natasha [name] is staying with patient and will assist with meals and meal  preparation.   Reviewed scheduled / upcoming provider appointments. Discussed importance of patient keeping follow up appointments with providers.  Referral sent to community resource guide for [employer] health care meal assistance.              SDOH assessments and interventions completed:  No     Care Coordination Interventions:  Yes, provided   Follow up plan: Follow up call scheduled for 10/06/22    Encounter Outcome:  Pt. Visit Completed   Natasha Plowman RN,BSN,CCM [place] [phone] direct line

## 2022-09-29 NOTE — Telephone Encounter (Signed)
   Telephone encounter was:  Unsuccessful.  09/29/2022 Name: Natasha [name] MRN: [id number] DOB: [birth year]  Unsuccessful outbound call made today to assist with:   Mom's Meal referral.  Outreach Attempt:  1st Attempt  A HIPAA compliant voice message was left requesting a return call.  Instructed patient to call back at [phone]. Left message on voicemail for patient's daughter Natasha [name] to return my call regarding Mom's Meal referral.   [place] Guide   ??[email or web address]  ?? [phone]   Website: [email or web address]  [email or web address]

## 2022-09-30 ENCOUNTER — Telehealth: Payer: Self-pay

## 2022-09-30 ENCOUNTER — Other Ambulatory Visit: Payer: Self-pay

## 2022-09-30 DIAGNOSIS — C8590 Non-Hodgkin lymphoma, unspecified, unspecified site: Secondary | ICD-10-CM

## 2022-09-30 NOTE — Telephone Encounter (Signed)
   Telephone encounter was:  Successful.  09/30/2022 Name: Natasha [name] MRN: [id number] DOB: 1946-02-01  [name] is a 76 y.o. year old female who is a primary care patient of [name], NP . The community resource team was consulted for assistance with  Mom's Meals referral.  Care guide performed the following interventions: Received email from Marcelina Morel at [employer] patient was contacted and patient declined benefits this morning.   Follow Up Plan:  No further follow up planned at this time. The patient has been provided with needed resources.  [place] Resource Care Guide   ??[email or web address]  ?? [phone]   Website: [email or web address]  [email or web address]

## 2022-10-01 ENCOUNTER — Inpatient Hospital Stay: Payer: Medicare Other | Attending: Oncology

## 2022-10-01 ENCOUNTER — Encounter (HOSPITAL_COMMUNITY): Payer: Self-pay | Admitting: Oncology

## 2022-10-01 ENCOUNTER — Inpatient Hospital Stay (HOSPITAL_BASED_OUTPATIENT_CLINIC_OR_DEPARTMENT_OTHER): Payer: Medicare Other | Admitting: Oncology

## 2022-10-01 ENCOUNTER — Encounter: Payer: Self-pay | Admitting: Oncology

## 2022-10-01 ENCOUNTER — Telehealth: Payer: Self-pay

## 2022-10-01 VITALS — BP 91/59 | HR 80 | Temp 98.7°F | Resp 18 | Wt 159.0 lb

## 2022-10-01 DIAGNOSIS — Z87891 Personal history of nicotine dependence: Secondary | ICD-10-CM | POA: Insufficient documentation

## 2022-10-01 DIAGNOSIS — R5383 Other fatigue: Secondary | ICD-10-CM | POA: Insufficient documentation

## 2022-10-01 DIAGNOSIS — N200 Calculus of kidney: Secondary | ICD-10-CM | POA: Diagnosis not present

## 2022-10-01 DIAGNOSIS — K8020 Calculus of gallbladder without cholecystitis without obstruction: Secondary | ICD-10-CM | POA: Diagnosis not present

## 2022-10-01 DIAGNOSIS — Z803 Family history of malignant neoplasm of breast: Secondary | ICD-10-CM | POA: Insufficient documentation

## 2022-10-01 DIAGNOSIS — J449 Chronic obstructive pulmonary disease, unspecified: Secondary | ICD-10-CM | POA: Insufficient documentation

## 2022-10-01 DIAGNOSIS — Z801 Family history of malignant neoplasm of trachea, bronchus and lung: Secondary | ICD-10-CM | POA: Diagnosis not present

## 2022-10-01 DIAGNOSIS — R519 Headache, unspecified: Secondary | ICD-10-CM | POA: Diagnosis not present

## 2022-10-01 DIAGNOSIS — M4856XA Collapsed vertebra, not elsewhere classified, lumbar region, initial encounter for fracture: Secondary | ICD-10-CM | POA: Diagnosis not present

## 2022-10-01 DIAGNOSIS — Z79624 Long term (current) use of inhibitors of nucleotide synthesis: Secondary | ICD-10-CM | POA: Insufficient documentation

## 2022-10-01 DIAGNOSIS — R0602 Shortness of breath: Secondary | ICD-10-CM | POA: Insufficient documentation

## 2022-10-01 DIAGNOSIS — Z79899 Other long term (current) drug therapy: Secondary | ICD-10-CM | POA: Diagnosis not present

## 2022-10-01 DIAGNOSIS — Z818 Family history of other mental and behavioral disorders: Secondary | ICD-10-CM | POA: Insufficient documentation

## 2022-10-01 DIAGNOSIS — Z8249 Family history of ischemic heart disease and other diseases of the circulatory system: Secondary | ICD-10-CM | POA: Insufficient documentation

## 2022-10-01 DIAGNOSIS — I509 Heart failure, unspecified: Secondary | ICD-10-CM | POA: Insufficient documentation

## 2022-10-01 DIAGNOSIS — C8300 Small cell B-cell lymphoma, unspecified site: Secondary | ICD-10-CM | POA: Diagnosis not present

## 2022-10-01 DIAGNOSIS — J90 Pleural effusion, not elsewhere classified: Secondary | ICD-10-CM | POA: Diagnosis not present

## 2022-10-01 DIAGNOSIS — Z832 Family history of diseases of the blood and blood-forming organs and certain disorders involving the immune mechanism: Secondary | ICD-10-CM | POA: Diagnosis not present

## 2022-10-01 DIAGNOSIS — R161 Splenomegaly, not elsewhere classified: Secondary | ICD-10-CM | POA: Diagnosis not present

## 2022-10-01 DIAGNOSIS — J948 Other specified pleural conditions: Secondary | ICD-10-CM | POA: Insufficient documentation

## 2022-10-01 DIAGNOSIS — R9082 White matter disease, unspecified: Secondary | ICD-10-CM | POA: Insufficient documentation

## 2022-10-01 DIAGNOSIS — Z5941 Food insecurity: Secondary | ICD-10-CM | POA: Diagnosis not present

## 2022-10-01 DIAGNOSIS — C8309 Small cell B-cell lymphoma, extranodal and solid organ sites: Secondary | ICD-10-CM | POA: Diagnosis not present

## 2022-10-01 DIAGNOSIS — C8590 Non-Hodgkin lymphoma, unspecified, unspecified site: Secondary | ICD-10-CM

## 2022-10-01 DIAGNOSIS — D649 Anemia, unspecified: Secondary | ICD-10-CM

## 2022-10-01 LAB — CBC WITH DIFFERENTIAL/PLATELET
Abs Immature Granulocytes: 0.44 10*3/uL — ABNORMAL HIGH (ref 0.00–0.07)
Basophils Absolute: 0.1 10*3/uL (ref 0.0–0.1)
Basophils Relative: 1 %
Eosinophils Absolute: 0.1 10*3/uL (ref 0.0–0.5)
Eosinophils Relative: 1 %
HCT: 25.9 % — ABNORMAL LOW (ref 36.0–46.0)
Hemoglobin: 7.9 g/dL — ABNORMAL LOW (ref 12.0–15.0)
Immature Granulocytes: 7 %
Lymphocytes Relative: 69 %
Lymphs Abs: 4.2 10*3/uL — ABNORMAL HIGH (ref 0.7–4.0)
MCH: 28.9 pg (ref 26.0–34.0)
MCHC: 30.5 g/dL (ref 30.0–36.0)
MCV: 94.9 fL (ref 80.0–100.0)
Monocytes Absolute: 0.5 10*3/uL (ref 0.1–1.0)
Monocytes Relative: 8 %
Neutro Abs: 0.8 10*3/uL — ABNORMAL LOW (ref 1.7–7.7)
Neutrophils Relative %: 14 %
Platelets: 74 10*3/uL — ABNORMAL LOW (ref 150–400)
RBC: 2.73 MIL/uL — ABNORMAL LOW (ref 3.87–5.11)
RDW: 17.5 % — ABNORMAL HIGH (ref 11.5–15.5)
Smear Review: NORMAL
WBC: 6.1 10*3/uL (ref 4.0–10.5)
nRBC: 0.7 % — ABNORMAL HIGH (ref 0.0–0.2)

## 2022-10-01 LAB — COMPREHENSIVE METABOLIC PANEL
ALT: 14 U/L (ref 0–44)
AST: 27 U/L (ref 15–41)
Albumin: 2.8 g/dL — ABNORMAL LOW (ref 3.5–5.0)
Alkaline Phosphatase: 170 U/L — ABNORMAL HIGH (ref 38–126)
Anion gap: 6 (ref 5–15)
BUN: 37 mg/dL — ABNORMAL HIGH (ref 8–23)
CO2: 28 mmol/L (ref 22–32)
Calcium: 8.2 mg/dL — ABNORMAL LOW (ref 8.9–10.3)
Chloride: 107 mmol/L (ref 98–111)
Creatinine, Ser: 0.79 mg/dL (ref 0.44–1.00)
GFR, Estimated: >60 mL/min (ref >60)
Glucose, Bld: 144 mg/dL — ABNORMAL HIGH (ref 70–99)
Potassium: 4.2 mmol/L (ref 3.5–5.1)
Sodium: 141 mmol/L (ref 135–145)
Total Bilirubin: 1.4 mg/dL — ABNORMAL HIGH (ref 0.3–1.2)
Total Protein: 5.1 g/dL — ABNORMAL LOW (ref 6.5–8.1)

## 2022-10-01 LAB — SAMPLE TO BLOOD BANK

## 2022-10-01 NOTE — Progress Notes (Unsigned)
Patient does have occasional diarrhea improved by Imodium, last episode 2 days ago.

## 2022-10-01 NOTE — Patient Outreach (Addendum)
  Care Coordination   Follow Up Visit Note   10/01/2022 Name: Natasha [name] MRN: [id number] DOB: 29-Sep-1946  Natasha [name] is a 76 y.o. year old female who sees [name], NP for primary care. I  spoke with daughter, Natasha [name]  What matters to the patients health and wellness today?   Contact from patients daughter, Natasha [name] stating patient home health nurse did not show on 09/30/22. .  This RNCM contacted [facility] home health and spoke with Natasha [name], Publishing copy regarding daughters concern . Natasha [name] states home health nurse, Natasha [name] attempted to call patient /daughter and went to home on 09/30/22 but was unable to reach anyone.  Also states home health nurse attempted to call patient / daughter again today and went to home but was unable to reach anyone.     RNCM explained to [place] that per daughter and chart review patient has medical appointment this afternoon with provider.  Natasha [name] provided RNCM with phone number for Natasha [name] home health nurse to contact. Message also received from [place], Mudlogger with [facility] home health stating home health nurse is trying to reach [facility].   Goals Addressed             This Visit's Progress    Patient / caregiver stated:  Management of health conditions       Care Coordination Interventions: [facility] home health called to follow up on daughters concern that patient has not had home health visit from nurse.  Daughter notified that RNCM has spoken with Natasha [name], Publishing copy with [name] and home health nurse is attempting to reach her.  Daughter advised to return call to home health nurse to discuss and arrange visit date.  Collaborated with home health nurse Natasha [name] regarding arrangement of home health visit and dynamics of patients current caregiver status.              SDOH assessments and interventions completed:  No     Care Coordination Interventions:  Yes, provided   Follow up plan:  as previously scheduled     Encounter Outcome:  Pt. Visit Completed   Quinn Plowman RN,BSN,CCM [place] [phone] direct line

## 2022-10-03 ENCOUNTER — Encounter: Payer: Self-pay | Admitting: Oncology

## 2022-10-03 NOTE — Progress Notes (Signed)
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  Her most recent CBC from 06/22/2022 showed white count of 4.3, H&H of 8/26.6 with an MCV of 93 and a platelet count of 77.  Looking back at her prior CBCs her hemoglobin was normal at 13.3 until November 2020 and since June 2023 her hemoglobin has been fluctuating between 8.5-9.5.  Platelets were normal up until November 2020 but fluctuating between 70-80 since June 2023.  Her hemoglobin was normal at 13 in May 2021 as well and we do not have any labs between 2021 and 2023.  Patient states that she was diagnosed with a possible lymphoma a few years ago in [place] but was told that she does not require any treatment   Patient had a CT chest abdomen and pelvis with contrast which showed bulky bilateral axillary mediastinal and hilar adenopathy with the largest axillary node measuring 3.3 x 2.5 cm as compared to 1.7 cm back in 2020.  Severe splenomegaly of 21.9 cm.  Bulky intra-abdominal adenopathy.  Moderate right pleural effusion   Flow cytometry from peripheral blood that was done at [facility] showed clonal B cells which was CD5 negative CD10 negative CD103 negative and CD123 negative.  Differential diagnosis includes marginal zone lymphoma, lymphoplasmacytic lymphoma, DLBCL and atypical CLL.  Blood  smear shows a small to medium lymphoid cells with irregular nuclear contours containing moderately condensed chromatin.  Large abnormal lymphocytes are rare.  Taken together these morphological and immunophenotypic findings favor the diagnosis of marginal zone lymphoma.   Patient had pleural fluid tapped as well and cytology was again consistent with involvement with patient's known B-cell lymphoma consistent with peripheral blood flow cytometry pathology.   Plan was to offer Bendamustine Rituxan chemotherapy for 6 cycles.  However patient had repeated episodes of hospitalization for respiratory failure and worsening performance status.  Plan was therefore made to proceed with acalabrutinib instead.  She started taking that on 08/31/2022    Interval history-patient states that she has been giving her acalabrutinib daily and she has been taking it without missing any doses.  Breathing has been stable so far on 4 L of oxygen.  Denies any significant nausea vomiting or diarrhea.  ECOG PS- 2 Pain scale- 0   Review of systems- Review of Systems  Constitutional:  Positive for malaise/fatigue. Negative for chills, fever and weight loss.  HENT:  Negative for congestion, ear discharge and nosebleeds.   Eyes:  Negative for blurred vision.  Respiratory:  Positive for shortness of breath. Negative for cough, hemoptysis, sputum production and wheezing.   Cardiovascular:  Negative for chest pain, palpitations, orthopnea and claudication.  Gastrointestinal:  Negative for abdominal pain, blood in stool, constipation, diarrhea, heartburn, melena, nausea and vomiting.  Genitourinary:  Negative for dysuria, flank pain, frequency, hematuria and urgency.  Musculoskeletal:  Negative for back pain, joint pain and myalgias.  Skin:  Negative for  rash.  Neurological:  Negative for dizziness, tingling, focal weakness, seizures, weakness and headaches.  Endo/Heme/Allergies:  Does not bruise/bleed easily.   Psychiatric/Behavioral:  Negative for depression and suicidal ideas. The patient does not have insomnia.       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   CHF (congestive heart failure) (HCC)    COPD (chronic obstructive pulmonary disease) (HCC)    Diabetes mellitus without complication (HCC)    Hypotension    Pleural effusion    ARMc 863m,  2 weeks ago   Vaginal delivery    x 5     Past Surgical History:  Procedure Laterality Date   IR IMAGING GUIDED PORT INSERTION  08/16/2022   TONSILLECTOMY Bilateral    as a child    Social History   Socioeconomic History   Marital status: Widowed    Spouse name: Not on file   Number of children: 2   Years of education: Not on file   Highest education level: 9th grade  Occupational History   Occupation: unemployed  Tobacco Use   Smoking status: Former    Packs/day: 2.00    Years: 55.00    Total pack years: 110.00    Types: Cigarettes    Start date: 07/07/1961    Quit date: 08/02/2022    Years since quitting: 0.1   Smokeless tobacco: Never   Tobacco comments:    1/2 pack she states but family says 2 PPD  Vaping Use   Vaping Use: Never used  Substance and Sexual Activity   Alcohol use: No   Drug use: Never   Sexual activity: Not Currently    Partners: Male    Birth control/protection: Post-menopausal  Other Topics Concern   Not on file  Social History Narrative   08/14/20   [place]  Living: to be near daughter   Work: retired      FPhysiological scientistchildren - JIT consultant(nearby) and son JEvelena Peat[place]  8 grandchildren, & 2 great-grandchildren.      Enjoys: stays at home      Exercise: walking to her daughter's store   Diet: eats fruit, air fried chicken      Safety   Seat belts: Yes    Guns: No   Safe in relationships: Yes    Social Determinants of Health   Financial Resource Strain: Medium Risk (08/24/2022)    Overall Financial Resource Strain (CARDIA)    Difficulty of Paying Living Expenses: Somewhat hard  Food Insecurity: No Food Insecurity (09/18/2022)   Hunger Vital Sign    Worried About Running Out of Food in the Last Year: Never true    Ran Out of Food in the Last Year: Never true  Recent Concern: [place] (08/30/2022)   Hunger Vital Sign    Worried About Running Out of Food in the Last Year: Sometimes true    Ran Out of Food in the Last Year: Sometimes true  Transportation Needs: No Transportation Needs (09/18/2022)   PRAPARE - THydrologist(Medical): No    Lack of Transportation (Non-Medical): No  Recent Concern: Transportation Needs - Unmet Transportation Needs (08/30/2022)   PRAPARE - THydrologist(Medical): Yes    Lack of Transportation (Non-Medical): No  Physical Activity: Inactive (08/24/2022)  Exercise Vital Sign    Days of Exercise per Week: 0 days    Minutes of Exercise per Session: 0 min  Stress: Stress Concern Present (08/24/2022)   [place]    Feeling of Stress : To some extent  Social Connections: Socially Isolated (08/24/2022)   Social Connection and Isolation Panel [NHANES]    Frequency of Communication with Friends and Family: More than three times a week    Frequency of Social Gatherings with Friends and Family: More than three times a week    Attends Religious Services: Never    Marine scientist or Organizations: No    Attends Archivist Meetings: Never    Marital Status: Widowed  Intimate Partner Violence: Not At Risk (09/18/2022)   Humiliation, Afraid, Rape, and Kick questionnaire    Fear of Current or Ex-Partner: No    Emotionally Abused: No    Physically Abused: No    Sexually Abused: No    Family History  Problem Relation Age of Onset   Breast cancer Mother    Alzheimer's disease  Father    Lung cancer Father        lung   Varicose Veins Brother    Heart attack Brother      Current Outpatient Medications:    acalabrutinib maleate (CALQUENCE) 100 MG tablet, Take 1 tablet (100 mg total) by mouth 2 (two) times daily., Disp: 60 tablet, Rfl: 1   acyclovir (ZOVIRAX) 400 MG tablet, Take 400 mg by mouth daily., Disp: , Rfl:    albuterol (VENTOLIN HFA) 108 (90 Base) MCG/ACT inhaler, TAKE 2 PUFFS BY MOUTH EVERY 6 HOURS AS NEEDED FOR WHEEZE OR SHORTNESS OF BREATH, Disp: 18 g, Rfl: 1   clobetasol cream (TEMOVATE) 5.02 %, Apply 1 Application topically daily., Disp: , Rfl:    furosemide (LASIX) 20 MG tablet, Take 1 tablet (20 mg total) by mouth daily., Disp: 90 tablet, Rfl: 3   insulin NPH-regular Human (70-30) 100 UNIT/ML injection, Inject 5 Units into the skin 2 (two) times daily with a meal. Use only for hyperglycemia, CBG+ 300. Start with 5 units, titrate up to max 10 unit per dose if need., Disp: 10 mL, Rfl: 0   ipratropium-albuterol (DUONEB) 0.5-2.5 (3) MG/3ML SOLN, Take 3 mLs by nebulization every 6 (six) hours as needed (shortness of breath or wheezing)., Disp: , Rfl:    levothyroxine (SYNTHROID) 50 MCG tablet, Take 1 tablet (50 mcg total) by mouth daily at 6 (six) AM., Disp: 30 tablet, Rfl: 0   lidocaine-prilocaine (EMLA) cream, Apply to affected area once (Patient taking differently: 1 Application. Apply to mediport site 2 hours before infusion), Disp: 30 g, Rfl: 3   loperamide (IMODIUM) 2 MG capsule, Take 2 mg by mouth as needed for diarrhea or loose stools., Disp: , Rfl:    ondansetron (ZOFRAN) 8 MG tablet, Take 8 mg by mouth every 8 (eight) hours as needed for nausea, vomiting or refractory nausea / vomiting. Take 1 tablet q6H for 4 days after chemotherapy., Disp: , Rfl:    prochlorperazine (COMPAZINE) 10 MG tablet, Take 1 tablet by mouth every 6 (six) hours as needed for nausea or vomiting. Take 1 tab q6H for 4 days after chemotherapy., Disp: , Rfl:    simethicone  (MYLICON) 80 MG chewable tablet, Chew 180 mg by mouth every 6 (six) hours as needed for flatulence. Take 1 capsule PO PRN gas relief., Disp: , Rfl:    nicotine (  NICODERM CQ - DOSED IN MG/24 HOURS) 21 mg/24hr patch, Place 21 mg onto the skin daily. (Patient not taking: Reported on 10/01/2022), Disp: , Rfl:   Physical exam:  Vitals:   10/01/22 1400  BP: (!) 91/59  Pulse: 80  Resp: 18  Temp: 98.7 F (37.1 C)  TempSrc: Tympanic  SpO2: 100%  Weight: 159 lb (72.1 kg)   Physical Exam Cardiovascular:     Rate and Rhythm: Normal rate and regular rhythm.     Heart sounds: Normal heart sounds.  Pulmonary:     Effort: Pulmonary effort is normal.     Breath sounds: Normal breath sounds.  Abdominal:     General: Bowel sounds are normal.     Palpations: Abdomen is soft.     Comments: Palpable splenomegaly  Musculoskeletal:     Comments: Bilateral +1 edema  Lymphadenopathy:     Comments: Palpable axillary adenopathy right greater than left  Skin:    General: Skin is warm and dry.  Neurological:     Mental Status: She is alert and oriented to person, place, and time.         Latest Ref Rng & Units 10/01/2022    2:20 PM  CMP  Glucose 70 - 99 mg/dL 144   BUN 8 - 23 mg/dL 37   Creatinine 0.44 - 1.00 mg/dL 0.79   Sodium 135 - 145 mmol/L 141   Potassium 3.5 - 5.1 mmol/L 4.2   Chloride 98 - 111 mmol/L 107   CO2 22 - 32 mmol/L 28   Calcium 8.9 - 10.3 mg/dL 8.2   Total Protein 6.5 - 8.1 g/dL 5.1   Total Bilirubin 0.3 - 1.2 mg/dL 1.4   Alkaline Phos 38 - 126 U/L 170   AST 15 - 41 U/L 27   ALT 0 - 44 U/L 14       Latest Ref Rng & Units 10/01/2022    2:20 PM  CBC  WBC 4.0 - 10.5 K/uL 6.1   Hemoglobin 12.0 - 15.0 g/dL 7.9   Hematocrit 36.0 - 46.0 % 25.9   Platelets 150 - 400 K/uL 74     No images are attached to the encounter.  CT BONE MARROW BIOPSY & ASPIRATION  Result Date: 09/24/2022 INDICATION: Pancytopenia EXAM: CT BONE MARROW BIOPSY AND ASPIRATION MEDICATIONS: None.  ANESTHESIA/SEDATION: Local analgesia FLUOROSCOPY TIME:  N/a COMPLICATIONS: None immediate. PROCEDURE: Informed written consent was obtained from the patient after a thorough discussion of the procedural risks, benefits and alternatives. All questions were addressed. Maximal Sterile Barrier Technique was utilized including caps, mask, sterile gowns, sterile gloves, sterile drape, hand hygiene and skin antiseptic. A timeout was performed prior to the initiation of the procedure. The patient was placed prone on the CT exam table. Limited CT of the pelvis was performed for planning purposes. Skin entry site was marked, and the overlying skin was prepped and draped in the standard sterile fashion. Local analgesia was obtained with 1% lidocaine. Using CT guidance, an 11 gauge needle was advanced just deep to the cortex of the right posterior ilium. Subsequently, bone marrow aspiration and core biopsy were performed. Specimens were submitted to lab/pathology for handling. Hemostasis was achieved with manual pressure, and a clean dressing was placed. The patient tolerated the procedure well without immediate complication. IMPRESSION: Successful CT-guided bone marrow aspiration and core biopsy of the right posterior ilium. Electronically Signed   By: Albin Felling M.D.   On: 09/24/2022 12:40   DG Chest Port 1  View  Result Date: 09/20/2022 CLINICAL DATA:  Status post right thoracentesis EXAM: PORTABLE CHEST 1 VIEW COMPARISON:  None Available. FINDINGS: There is a right-sided chest port with catheter tip terminating near the superior cavoatrial junction. Stable appearance of the cardiomediastinal silhouette. Bilateral moderate pleural effusions, slightly improved on the right. No findings of pneumothorax. Scattered dependent hazy opacities, likely reflective of underlying passive atelectasis. Osseous structures are unremarkable. IMPRESSION: Bilateral pleural effusions, slightly improved on the right side following  thoracentesis. No findings of pneumothorax. Electronically Signed   By: Albin Felling M.D.   On: 09/20/2022 13:25   US THORACENTESIS ASP PLEURAL SPACE W/IMG GUIDE  Result Date: 09/20/2022 INDICATION: 76 year old female. History of gastric lymphoma with recurrent pleural effusions. Currently admitted for shortness of breath. Found to have bilateral pleural effusions right greater than left. Request is for therapeutic and diagnostic thoracentesis EXAM: ULTRASOUND GUIDED RIGHT-SIDED THERAPEUTIC AND DIAGNOSTIC THORACENTESIS MEDICATIONS: Lidocaine 1% 10 mL COMPLICATIONS: None immediate. PROCEDURE: An ultrasound guided thoracentesis was thoroughly discussed with the patient and questions answered. The benefits, risks, alternatives and complications were also discussed. The patient understands and wishes to proceed with the procedure. Written consent was obtained. Ultrasound was performed to localize and mark an adequate pocket of fluid in the right chest. The area was then prepped and draped in the normal sterile fashion. 1% Lidocaine was used for local anesthesia. Under ultrasound guidance a 6 Fr Safe-T-Centesis catheter was introduced. Thoracentesis was performed. The catheter was removed and a dressing applied. FINDINGS: A total of approximately 1.2 L of amber colored fluid was removed. Samples were sent to the laboratory as requested by the clinical team. IMPRESSION: Successful ultrasound guided therapeutic and diagnostic right-sided thoracentesis yielding 1.2 L of pleural fluid. Read by: Rushie Nyhan, NP Electronically Signed   By: Albin Felling M.D.   On: 09/20/2022 13:22   CT ABDOMEN PELVIS WO CONTRAST  Result Date: 09/19/2022 CLINICAL DATA:  Abdominal pain, acute, nonlocalized r/o retroperitoneal bleed. Lymphoma EXAM: CT ABDOMEN AND PELVIS WITHOUT CONTRAST TECHNIQUE: Multidetector CT imaging of the abdomen and pelvis was performed following the standard protocol without IV contrast. RADIATION  DOSE REDUCTION: This exam was performed according to the departmental dose-optimization program which includes automated exposure control, adjustment of the mA and/or kV according to patient size and/or use of iterative reconstruction technique. COMPARISON:  MRI lumbar spine 12/20/2019. FINDINGS: Lower chest: Small to moderate right and small left pleural effusions that are partially visualized. Partial collapse of the right lower lobe and passive atelectasis of left lower lobe. Partially visualized central venous catheter with tip terminating at superior caval junction. Cardiac findings suggestive of anemia. Hepatobiliary: No focal liver abnormality. Calcified gallstone noted within the gallbladder lumen. No gallbladder wall thickening or pericholecystic fluid. No biliary dilatation. Pancreas: No focal lesion. Normal pancreatic contour. No surrounding inflammatory changes. No main pancreatic ductal dilatation. Spleen: Normal in size without focal abnormality. Adrenals/Urinary Tract: No adrenal nodule bilaterally. Punctate right nephrolithiasis. No left nephrolithiasis. No hydronephrosis. No definite contour-deforming renal mass. No ureterolithiasis or hydroureter. The urinary bladder is unremarkable. Stomach/Bowel: Stomach is within normal limits. No evidence of bowel wall thickening or dilatation. Appendix appears normal. Vascular/Lymphatic: No abdominal aorta or iliac aneurysm. At least moderate atherosclerotic plaque of the aorta and its branches. Markedly difficult to delineate on this noncontrast study retroperitoneal soft tissue densities and mesenteric soft tissue densities likely representing marked adenopathy. Bilateral pelvic sidewall and inguinal lymphadenopathy: As an example a 1.9 cm right inguinal lymph node (2:86). Reproductive: Uterus and  bilateral adnexa are unremarkable. Other: No intraperitoneal free fluid. No intraperitoneal free gas. No organized fluid collection. Musculoskeletal: No abdominal  wall hernia or abnormality. Subcutaneus soft tissue edema. No suspicious lytic or blastic osseous lesions. No acute displaced fracture. Chronic L2 and L3 compression fractures. IMPRESSION: 1. Markedly limited evaluation on this noncontrast study. No definite large retroperitoneal bleed. 2. Difficult to delineate on this noncontrast study retroperitoneal soft tissue densities and mesenteric soft tissue densities representing marked conglomerative lymphadenopathy as well as bilateral pelvic sidewall and inguinal lymphadenopathy consistent with known history of versus recurrent disease. 3. Marked splenomegaly also consistent with known history of lymphoma versus recurrent disease. 4. Nonobstructive punctate right nephrolithiasis. 5. Cholelithiasis with no definite CT findings cute cholecystitis-limited evaluation on this noncontrast study. Consider right upper quadrant ultrasound for a more sensitive evaluation of the gallbladder peer 6. Small to moderate right and small left pleural effusions. Electronically Signed   By: Iven Finn M.D.   On: 09/19/2022 20:00   DG Chest 2 View  Result Date: 09/17/2022 CLINICAL DATA:  Shortness of breath EXAM: CHEST - 2 VIEW COMPARISON:  08/30/2022 FINDINGS: Cardiac shadow is within normal limits. Right chest wall port is seen and stable. Bilateral pleural effusions are noted right considerably greater than left increased from the prior exam. Changes of central vascular congestion are noted consistent with CHF. IMPRESSION: Vascular congestion with increasing pleural effusions right greater than left. Electronically Signed   By: Inez Catalina M.D.   On: 09/17/2022 23:16   CT HEAD WO CONTRAST (5MM)  Result Date: 09/11/2022 CLINICAL DATA:  Headache, new onset. EXAM: CT HEAD WITHOUT CONTRAST TECHNIQUE: Contiguous axial images were obtained from the base of the skull through the vertex without intravenous contrast. RADIATION DOSE REDUCTION: This exam was performed according to  the departmental dose-optimization program which includes automated exposure control, adjustment of the mA and/or kV according to patient size and/or use of iterative reconstruction technique. COMPARISON:  CT without contrast 07/12/2022 FINDINGS: Brain: No acute infarct, hemorrhage, or mass lesion is present. Mild atrophy and white matter changes are stable. The ventricles are of normal size. No significant extraaxial fluid collection is present. The brainstem and cerebellum are within normal limits. Vascular: No hyperdense vessel or unexpected calcification. Skull: Calvarium is intact. No focal lytic or blastic lesions are present. No significant extracranial soft tissue lesion is present. Sinuses/Orbits: The paranasal sinuses and mastoid air cells are clear. The globes and orbits are within normal limits. Other: Asymmetric enlargement of the parotid gland, right greater than left is similar the prior study. This again raises concern for lymphomatous involvement. IMPRESSION: 1. No acute intracranial abnormality or significant interval change. 2. Stable mild atrophy and white matter disease. This likely reflects the sequela of chronic microvascular ischemia. 3. Asymmetric enlargement of the parotid gland, right greater than left is similar the prior study. This again raises concern for lymphomatous involvement. Electronically Signed   By: San Morelle M.D.   On: 09/11/2022 12:33     Assessment and plan- Patient is a 76 y.o. female with stage IV nodal marginal zone lymphoma  Patient was started on acalabrutinib in early November 2023.  Initially plan was to give her Bendamustine Rituxan chemotherapy but due to her declining performance status with repeated hospitalization plan was made to switch her to a BTK inhibitor instead..  To see any significant response to treatment so far.  She still has palpable axillary adenopathy and splenomegaly.  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  We are yet to get a PET scan because each time to schedule a PET scan and has been getting canceled or delayed due to patient being hospitalized and her blood sugars not being under control.  I will try again to get a PET scan at this time. Patient will continue acalabrutinib as planned  I will see her back in 4 weeks with labs.  We will also obtain interim labs in 2 weeks to see if she needs a blood transfusion.  Hemoglobin has been around 7-8.  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  I will discuss GI referral with her at next time.  LFTs are presently normal   Visit Diagnosis 1. Nodal marginal zone B-cell lymphoma ([place])   2. Symptomatic anemia   3. High risk medication use      Dr. Randa Evens, MD, MPH [facility] [phone] 10/03/2022 7:44 PM

## 2022-10-04 ENCOUNTER — Other Ambulatory Visit (HOSPITAL_COMMUNITY): Payer: Self-pay

## 2022-10-04 ENCOUNTER — Telehealth: Payer: Self-pay

## 2022-10-04 ENCOUNTER — Inpatient Hospital Stay: Payer: Medicare Other

## 2022-10-04 NOTE — Patient Outreach (Signed)
  Care Coordination   Follow Up Visit Note   10/04/2022 Name: Ambrea Hegler Fehl MRN: [id number] DOB: 02/23/1946  Prentice Docker Sonneborn is a 76 y.o. year old female who sees [name], NP for primary care. I  contacted daughter, Lenon Oms  What matters to the patients health and wellness today?  Called received from [place] with [name] stating daughter and home health nurse collaborated today and start of care date for home health services is scheduled for 10/06/22.   Patient will need new primary care provider.     Goals Addressed             This Visit's Progress    Patient / caregiver stated:  Management of health conditions       Care Coordination Interventions: Daughter contacted to confirm patients start of care date with [facility] home health nurse is Wednesday 10/06/22. Daughter notified that letter was sent to patient in [place] stating her primary care provider practice will no longer be a part of [place] effective 11/05/22 and that her primary care provider, [name], NP is moving to another practice, [facility].   Advised to contact patients insurance provider for in plan providers in her area or contact [place] to see if patient can transfer  there with her current provider.  Daughter states patient is also considering changing her insurance plan.              SDOH assessments and interventions completed:  No     Care Coordination Interventions:  Yes, provided   Follow up plan:  As previously scheduled     Encounter Outcome:  Pt. Visit Completed   Quinn Plowman RN,BSN,CCM [place] [phone] direct line

## 2022-10-04 NOTE — Telephone Encounter (Signed)
This encounter was created in error - please disregard.

## 2022-10-05 ENCOUNTER — Ambulatory Visit: Payer: Medicare Other | Admitting: Oncology

## 2022-10-05 ENCOUNTER — Ambulatory Visit: Payer: Medicare Other | Admitting: *Deleted

## 2022-10-05 ENCOUNTER — Other Ambulatory Visit: Payer: Medicare Other

## 2022-10-05 ENCOUNTER — Telehealth: Payer: Self-pay | Admitting: Oncology

## 2022-10-05 DIAGNOSIS — S41119A Laceration without foreign body of unspecified upper arm, initial encounter: Secondary | ICD-10-CM | POA: Diagnosis not present

## 2022-10-05 NOTE — Telephone Encounter (Signed)
Vm left with patient's daughter (patient request) to confirm her PET scan scheduled at [facility] on 12/27. Left address, phone number, carb reminder + NPO info in message.

## 2022-10-06 ENCOUNTER — Telehealth: Payer: Self-pay | Admitting: *Deleted

## 2022-10-06 ENCOUNTER — Ambulatory Visit: Payer: Self-pay

## 2022-10-06 DIAGNOSIS — Z9981 Dependence on supplemental oxygen: Secondary | ICD-10-CM | POA: Diagnosis not present

## 2022-10-06 DIAGNOSIS — J9621 Acute and chronic respiratory failure with hypoxia: Secondary | ICD-10-CM | POA: Diagnosis not present

## 2022-10-06 DIAGNOSIS — J9811 Atelectasis: Secondary | ICD-10-CM | POA: Diagnosis not present

## 2022-10-06 DIAGNOSIS — Z87891 Personal history of nicotine dependence: Secondary | ICD-10-CM | POA: Diagnosis not present

## 2022-10-06 DIAGNOSIS — Z9181 History of falling: Secondary | ICD-10-CM | POA: Diagnosis not present

## 2022-10-06 DIAGNOSIS — A419 Sepsis, unspecified organism: Secondary | ICD-10-CM | POA: Diagnosis not present

## 2022-10-06 DIAGNOSIS — D696 Thrombocytopenia, unspecified: Secondary | ICD-10-CM | POA: Diagnosis not present

## 2022-10-06 DIAGNOSIS — Z7952 Long term (current) use of systemic steroids: Secondary | ICD-10-CM | POA: Diagnosis not present

## 2022-10-06 DIAGNOSIS — Z794 Long term (current) use of insulin: Secondary | ICD-10-CM | POA: Diagnosis not present

## 2022-10-06 DIAGNOSIS — E039 Hypothyroidism, unspecified: Secondary | ICD-10-CM | POA: Diagnosis not present

## 2022-10-06 DIAGNOSIS — D630 Anemia in neoplastic disease: Secondary | ICD-10-CM | POA: Diagnosis not present

## 2022-10-06 DIAGNOSIS — I493 Ventricular premature depolarization: Secondary | ICD-10-CM | POA: Diagnosis not present

## 2022-10-06 DIAGNOSIS — I5033 Acute on chronic diastolic (congestive) heart failure: Secondary | ICD-10-CM | POA: Diagnosis not present

## 2022-10-06 DIAGNOSIS — C8300 Small cell B-cell lymphoma, unspecified site: Secondary | ICD-10-CM | POA: Diagnosis not present

## 2022-10-06 DIAGNOSIS — J441 Chronic obstructive pulmonary disease with (acute) exacerbation: Secondary | ICD-10-CM | POA: Diagnosis not present

## 2022-10-06 DIAGNOSIS — C9590 Leukemia, unspecified not having achieved remission: Secondary | ICD-10-CM | POA: Diagnosis not present

## 2022-10-06 DIAGNOSIS — E119 Type 2 diabetes mellitus without complications: Secondary | ICD-10-CM | POA: Diagnosis not present
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
## 2022-10-07 ENCOUNTER — Telehealth: Payer: Self-pay

## 2022-10-07 ENCOUNTER — Ambulatory Visit: Payer: Self-pay | Admitting: *Deleted

## 2022-10-07 NOTE — Patient Outreach (Signed)
  Care Coordination   Follow Up Visit Note   10/07/2022 Name: Nazaria Riesen Markus MRN: [id number] DOB: 1946-05-24  Prentice Docker Morissette is a 76 y.o. year old female who sees [name], NP for primary care. I spoke with Claire Shown, SW with [facility] health.   What matters to the patients health and wellness today?  Home health services / Home heating issues.     Goals Addressed             This Visit's Progress    Patient / caregiver stated:  Management of health conditions       Care Coordination Interventions: Collaboration with Claire Shown, SW with [name] home health:  Update from Ms. Clapp stating home visit performed on yesterday. She states patient appears to be doing well at this time related to heating situation. She reports discussing assisted living option with patient and states patient declined. Ms. Laymond Purser states Home PT/OT will put patient on hold for now until heating situation resolved.  She states patient and daughter has been made aware of this.   Claire Shown social worker made aware that patients landlord plans to replace heating unit.  Message sent to patients  primary care provider informing her that home health on hold due to patients home heating situation.  Collaboration with [employer], LCSW regarding patients home heating issues:  Update from [place] stating she received call from patients Landlord today who states the plan is to replace patients home heating unit. She states [employer] [employer] will do the work and start today.  Process will take a couple of weeks.              SDOH assessments and interventions completed:  No     Care Coordination Interventions:  Yes, provided   Follow up plan:  as previously scheduled     Encounter Outcome:  Pt. Visit Completed   Quinn Plowman RN,BSN,CCM [place] [phone] direct line
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
## 2022-10-07 NOTE — Assessment & Plan Note (Signed)
Advised patient to continue use of bronchodilators and supplemental oxygen. She is followed by pulmonologist, cardiologist and oncologist. Referral sent for bedside commode

## 2022-10-07 NOTE — Patient Outreach (Signed)
  Care Coordination   10/07/2022 Name: Natasha [name] MRN: [id number] DOB: [birth year]   Care Coordination Outreach Attempts:  Telephone call to Lorenda Hatchet, RN home health nurse with [place]. Unable to reach. HIPAA compliant message left with call back phone number and return call request.   Follow Up Plan:  Additional outreach attempts will be made to offer the patient care coordination information and services.   Encounter Outcome:  No Answer   Care Coordination Interventions:  No, not indicated    Quinn Plowman [facility] [place] [phone] direct line

## 2022-10-08 NOTE — Patient Instructions (Signed)
Visit Information  Thank you for taking time to visit with me today. Please don't hesitate to contact me if I can be of assistance to you.   Following are the goals we discussed today:   Goals Addressed             This Visit's Progress    In home care needs       Care Coordination Interventions: Collaboration phone call from patient's landlord's office -spoke with Carleen, confirming that the work order has been received to repair patient's heating unit  She confirmed that [name] will be completing the repairs, however due to the fact that the unit will have to be moved to the attic a Games developer and electrician will have to be contracted as well-the work should take approximately 2 weeks to complete Patient's daughter [name] contacted and informed, she was able to confirm that she was contacted by the landlord and informed of the above plan for the repairs to the heating unit. Patient's daughter will look in to the possibility of portable heater use, however due to landlord, they must be careful that they do not overload the circuits. Patient's daughter encouraged to be on the look out for calls regarding repairs to coordinate anticipated visits to the home-daughter agreeable        Our next appointment is by telephone on 10/22/22 at 11am  Please call the care guide team at [phone] if you need to cancel or reschedule your appointment.   If you are experiencing a Mental Health or [place] or need someone to talk to, please call 911   Patient verbalizes understanding of instructions and care plan provided today and agrees to view in [place]. Active MyChart status and patient understanding of how to access instructions and care plan via MyChart confirmed with patient.     Telephone follow up appointment with care management team member scheduled for: 10/22/22  Elliot Gurney, [place] Worker  [facility] Care Management [phone]

## 2022-10-08 NOTE — Patient Outreach (Signed)
  Care Coordination   Follow Up Visit Note   10/08/2022 Name: Natasha [name] MRN: [id number] DOB: April 12, 1946  Natasha [name] is a 76 y.o. year old female who sees Natasha Lo, NP for primary care. I spoke with  Natasha [name]'s daughter and landlord by phone on 10/07/22.  What matters to the patients health and wellness today?  Heating repairs    Goals Addressed             This Visit's Progress    In home care needs       Care Coordination Interventions: Collaboration phone call from patient's landlord's office -spoke with Natasha [name], confirming that the work order has been received to repair patient's heating unit  She confirmed that Natasha [name] will be completing the repairs, however due to the fact that the unit will have to be moved to the attic a Games developer and electrician will have to be contracted as well-the work should take approximately 2 weeks to complete Patient's daughter Natasha [name] contacted and informed, she was able to confirm that she was contacted by the landlord and informed of the above plan for the repairs to the heating unit. Patient's daughter will look in to the possibility of portable heater use, however due to landlord, they must be careful that they do not overload the circuits. Patient's daughter encouraged to be on the look out for calls regarding repairs to coordinate anticipated visits to the home-daughter agreeable        SDOH assessments and interventions completed:  No     Care Coordination Interventions:  Yes, provided   Follow up plan: Follow up call scheduled for 10/22/22    Encounter Outcome:  Pt. Visit Completed

## 2022-10-11 ENCOUNTER — Telehealth: Payer: Self-pay

## 2022-10-11 NOTE — Telephone Encounter (Signed)
Natasha [name] is calling in from [facility], they have faxed over several orders that require signatures. They have not received anything in the last couple of weeks. Did receive the email of all the orders and have routed them to [place].

## 2022-10-12 NOTE — Telephone Encounter (Signed)
Printed out and on [employer] for signature tomorrow when she is in office.

## 2022-10-13 DIAGNOSIS — E119 Type 2 diabetes mellitus without complications: Secondary | ICD-10-CM | POA: Diagnosis not present

## 2022-10-13 DIAGNOSIS — E039 Hypothyroidism, unspecified: Secondary | ICD-10-CM | POA: Diagnosis not present

## 2022-10-13 DIAGNOSIS — Z87891 Personal history of nicotine dependence: Secondary | ICD-10-CM | POA: Diagnosis not present

## 2022-10-13 DIAGNOSIS — I5033 Acute on chronic diastolic (congestive) heart failure: Secondary | ICD-10-CM | POA: Diagnosis not present

## 2022-10-13 DIAGNOSIS — J9811 Atelectasis: Secondary | ICD-10-CM | POA: Diagnosis not present

## 2022-10-13 DIAGNOSIS — Z794 Long term (current) use of insulin: Secondary | ICD-10-CM | POA: Diagnosis not present

## 2022-10-13 DIAGNOSIS — Z9981 Dependence on supplemental oxygen: Secondary | ICD-10-CM | POA: Diagnosis not present

## 2022-10-13 DIAGNOSIS — J441 Chronic obstructive pulmonary disease with (acute) exacerbation: Secondary | ICD-10-CM | POA: Diagnosis not present

## 2022-10-13 DIAGNOSIS — A419 Sepsis, unspecified organism: Secondary | ICD-10-CM | POA: Diagnosis not present

## 2022-10-13 DIAGNOSIS — Z9181 History of falling: Secondary | ICD-10-CM | POA: Diagnosis not present

## 2022-10-13 DIAGNOSIS — C9590 Leukemia, unspecified not having achieved remission: Secondary | ICD-10-CM | POA: Diagnosis not present

## 2022-10-13 DIAGNOSIS — J9621 Acute and chronic respiratory failure with hypoxia: Secondary | ICD-10-CM | POA: Diagnosis not present

## 2022-10-13 DIAGNOSIS — Z7952 Long term (current) use of systemic steroids: Secondary | ICD-10-CM | POA: Diagnosis not present

## 2022-10-13 DIAGNOSIS — C8300 Small cell B-cell lymphoma, unspecified site: Secondary | ICD-10-CM | POA: Diagnosis not present

## 2022-10-13 DIAGNOSIS — I493 Ventricular premature depolarization: Secondary | ICD-10-CM | POA: Diagnosis not present

## 2022-10-13 DIAGNOSIS — D696 Thrombocytopenia, unspecified: Secondary | ICD-10-CM | POA: Diagnosis not present

## 2022-10-13 DIAGNOSIS — D630 Anemia in neoplastic disease: Secondary | ICD-10-CM | POA: Diagnosis not present

## 2022-10-14 ENCOUNTER — Other Ambulatory Visit: Payer: Self-pay | Admitting: Oncology

## 2022-10-14 ENCOUNTER — Other Ambulatory Visit: Payer: Self-pay | Admitting: *Deleted

## 2022-10-14 ENCOUNTER — Telehealth: Payer: Self-pay | Admitting: *Deleted

## 2022-10-14 ENCOUNTER — Telehealth: Payer: Self-pay | Admitting: Oncology

## 2022-10-14 ENCOUNTER — Inpatient Hospital Stay: Payer: Medicare Other

## 2022-10-14 DIAGNOSIS — Z801 Family history of malignant neoplasm of trachea, bronchus and lung: Secondary | ICD-10-CM | POA: Diagnosis not present

## 2022-10-14 DIAGNOSIS — C8309 Small cell B-cell lymphoma, extranodal and solid organ sites: Secondary | ICD-10-CM | POA: Diagnosis not present

## 2022-10-14 DIAGNOSIS — R5383 Other fatigue: Secondary | ICD-10-CM | POA: Diagnosis not present

## 2022-10-14 DIAGNOSIS — I509 Heart failure, unspecified: Secondary | ICD-10-CM | POA: Diagnosis not present

## 2022-10-14 DIAGNOSIS — K8020 Calculus of gallbladder without cholecystitis without obstruction: Secondary | ICD-10-CM | POA: Diagnosis not present

## 2022-10-14 DIAGNOSIS — M4856XA Collapsed vertebra, not elsewhere classified, lumbar region, initial encounter for fracture: Secondary | ICD-10-CM | POA: Diagnosis not present

## 2022-10-14 DIAGNOSIS — Z8249 Family history of ischemic heart disease and other diseases of the circulatory system: Secondary | ICD-10-CM | POA: Diagnosis not present

## 2022-10-14 DIAGNOSIS — R519 Headache, unspecified: Secondary | ICD-10-CM | POA: Diagnosis not present

## 2022-10-14 DIAGNOSIS — N200 Calculus of kidney: Secondary | ICD-10-CM | POA: Diagnosis not present

## 2022-10-14 DIAGNOSIS — Z79624 Long term (current) use of inhibitors of nucleotide synthesis: Secondary | ICD-10-CM | POA: Diagnosis not present

## 2022-10-14 DIAGNOSIS — R161 Splenomegaly, not elsewhere classified: Secondary | ICD-10-CM | POA: Diagnosis not present

## 2022-10-14 DIAGNOSIS — Z87891 Personal history of nicotine dependence: Secondary | ICD-10-CM | POA: Diagnosis not present

## 2022-10-14 DIAGNOSIS — D649 Anemia, unspecified: Secondary | ICD-10-CM

## 2022-10-14 DIAGNOSIS — J948 Other specified pleural conditions: Secondary | ICD-10-CM | POA: Diagnosis not present

## 2022-10-14 DIAGNOSIS — C8300 Small cell B-cell lymphoma, unspecified site: Secondary | ICD-10-CM

## 2022-10-14 DIAGNOSIS — Z5941 Food insecurity: Secondary | ICD-10-CM | POA: Diagnosis not present

## 2022-10-14 DIAGNOSIS — Z803 Family history of malignant neoplasm of breast: Secondary | ICD-10-CM | POA: Diagnosis not present

## 2022-10-14 DIAGNOSIS — R9082 White matter disease, unspecified: Secondary | ICD-10-CM | POA: Diagnosis not present

## 2022-10-14 DIAGNOSIS — Z832 Family history of diseases of the blood and blood-forming organs and certain disorders involving the immune mechanism: Secondary | ICD-10-CM | POA: Diagnosis not present

## 2022-10-14 DIAGNOSIS — J449 Chronic obstructive pulmonary disease, unspecified: Secondary | ICD-10-CM | POA: Diagnosis not present

## 2022-10-14 DIAGNOSIS — Z79899 Other long term (current) drug therapy: Secondary | ICD-10-CM | POA: Diagnosis not present

## 2022-10-14 DIAGNOSIS — J90 Pleural effusion, not elsewhere classified: Secondary | ICD-10-CM | POA: Diagnosis not present

## 2022-10-14 DIAGNOSIS — R0602 Shortness of breath: Secondary | ICD-10-CM | POA: Diagnosis not present

## 2022-10-14 LAB — CBC WITH DIFFERENTIAL/PLATELET
Abs Immature Granulocytes: 0.02 10*3/uL (ref 0.00–0.07)
Basophils Absolute: 0.1 10*3/uL (ref 0.0–0.1)
Basophils Relative: 1 %
Eosinophils Absolute: 0.1 10*3/uL (ref 0.0–0.5)
Eosinophils Relative: 2 %
HCT: 21.4 % — ABNORMAL LOW (ref 36.0–46.0)
Hemoglobin: 6.4 g/dL — ABNORMAL LOW (ref 12.0–15.0)
Immature Granulocytes: 1 %
Lymphocytes Relative: 80 %
Lymphs Abs: 3.3 10*3/uL (ref 0.7–4.0)
MCH: 29.4 pg (ref 26.0–34.0)
MCHC: 29.9 g/dL — ABNORMAL LOW (ref 30.0–36.0)
MCV: 98.2 fL (ref 80.0–100.0)
Monocytes Absolute: 0.4 10*3/uL (ref 0.1–1.0)
Monocytes Relative: 10 %
Neutro Abs: 0.2 10*3/uL — CL (ref 1.7–7.7)
Neutrophils Relative %: 6 %
Platelets: 80 10*3/uL — ABNORMAL LOW (ref 150–400)
RBC: 2.18 MIL/uL — ABNORMAL LOW (ref 3.87–5.11)
RDW: 19.5 % — ABNORMAL HIGH (ref 11.5–15.5)
Smear Review: NORMAL
WBC: 4.1 10*3/uL (ref 4.0–10.5)
nRBC: 3.2 % — ABNORMAL HIGH (ref 0.0–0.2)

## 2022-10-14 LAB — COMPREHENSIVE METABOLIC PANEL
ALT: 12 U/L (ref 0–44)
AST: 15 U/L (ref 15–41)
Albumin: 3 g/dL — ABNORMAL LOW (ref 3.5–5.0)
Alkaline Phosphatase: 161 U/L — ABNORMAL HIGH (ref 38–126)
Anion gap: 6 (ref 5–15)
BUN: 28 mg/dL — ABNORMAL HIGH (ref 8–23)
CO2: 28 mmol/L (ref 22–32)
Calcium: 8.1 mg/dL — ABNORMAL LOW (ref 8.9–10.3)
Chloride: 107 mmol/L (ref 98–111)
Creatinine, Ser: 0.86 mg/dL (ref 0.44–1.00)
GFR, Estimated: >60 mL/min (ref >60)
Glucose, Bld: 176 mg/dL — ABNORMAL HIGH (ref 70–99)
Potassium: 4.4 mmol/L (ref 3.5–5.1)
Sodium: 141 mmol/L (ref 135–145)
Total Bilirubin: 1.3 mg/dL — ABNORMAL HIGH (ref 0.3–1.2)
Total Protein: 5.1 g/dL — ABNORMAL LOW (ref 6.5–8.1)

## 2022-10-14 LAB — PREPARE RBC (CROSSMATCH)

## 2022-10-14 NOTE — Telephone Encounter (Signed)
Attempt made to reach patient--received message "call cannot be completed at this time". VM left with patient's daughter to let her know that patient will need to come in tomorrow for previously scheduled blood transfusion per Dr. Janese Banks.

## 2022-10-14 NOTE — Telephone Encounter (Signed)
Called [name] and she will make sure her mom comes tom for blood transfusion. Her  hgb was 6.4.

## 2022-10-15 ENCOUNTER — Other Ambulatory Visit: Payer: Medicare Other

## 2022-10-15 ENCOUNTER — Inpatient Hospital Stay: Payer: Medicare Other

## 2022-10-15 ENCOUNTER — Telehealth: Payer: Self-pay | Admitting: *Deleted

## 2022-10-15 DIAGNOSIS — K8020 Calculus of gallbladder without cholecystitis without obstruction: Secondary | ICD-10-CM | POA: Diagnosis not present

## 2022-10-15 DIAGNOSIS — C8309 Small cell B-cell lymphoma, extranodal and solid organ sites: Secondary | ICD-10-CM | POA: Diagnosis not present

## 2022-10-15 DIAGNOSIS — I509 Heart failure, unspecified: Secondary | ICD-10-CM | POA: Diagnosis not present

## 2022-10-15 DIAGNOSIS — Z79899 Other long term (current) drug therapy: Secondary | ICD-10-CM | POA: Diagnosis not present

## 2022-10-15 DIAGNOSIS — J449 Chronic obstructive pulmonary disease, unspecified: Secondary | ICD-10-CM | POA: Diagnosis not present

## 2022-10-15 DIAGNOSIS — N200 Calculus of kidney: Secondary | ICD-10-CM | POA: Diagnosis not present

## 2022-10-15 DIAGNOSIS — J90 Pleural effusion, not elsewhere classified: Secondary | ICD-10-CM | POA: Diagnosis not present

## 2022-10-15 DIAGNOSIS — J948 Other specified pleural conditions: Secondary | ICD-10-CM | POA: Diagnosis not present

## 2022-10-15 DIAGNOSIS — Z803 Family history of malignant neoplasm of breast: Secondary | ICD-10-CM | POA: Diagnosis not present

## 2022-10-15 DIAGNOSIS — R519 Headache, unspecified: Secondary | ICD-10-CM | POA: Diagnosis not present

## 2022-10-15 DIAGNOSIS — D649 Anemia, unspecified: Secondary | ICD-10-CM

## 2022-10-15 DIAGNOSIS — Z79624 Long term (current) use of inhibitors of nucleotide synthesis: Secondary | ICD-10-CM | POA: Diagnosis not present

## 2022-10-15 DIAGNOSIS — Z8249 Family history of ischemic heart disease and other diseases of the circulatory system: Secondary | ICD-10-CM | POA: Diagnosis not present

## 2022-10-15 DIAGNOSIS — M4856XA Collapsed vertebra, not elsewhere classified, lumbar region, initial encounter for fracture: Secondary | ICD-10-CM | POA: Diagnosis not present

## 2022-10-15 DIAGNOSIS — R5383 Other fatigue: Secondary | ICD-10-CM | POA: Diagnosis not present

## 2022-10-15 DIAGNOSIS — Z801 Family history of malignant neoplasm of trachea, bronchus and lung: Secondary | ICD-10-CM | POA: Diagnosis not present

## 2022-10-15 DIAGNOSIS — Z5941 Food insecurity: Secondary | ICD-10-CM | POA: Diagnosis not present

## 2022-10-15 DIAGNOSIS — I5033 Acute on chronic diastolic (congestive) heart failure: Secondary | ICD-10-CM | POA: Diagnosis not present

## 2022-10-15 DIAGNOSIS — Z832 Family history of diseases of the blood and blood-forming organs and certain disorders involving the immune mechanism: Secondary | ICD-10-CM | POA: Diagnosis not present

## 2022-10-15 DIAGNOSIS — C8580 Other specified types of non-Hodgkin lymphoma, unspecified site: Secondary | ICD-10-CM

## 2022-10-15 DIAGNOSIS — N1831 Chronic kidney disease, stage 3a: Secondary | ICD-10-CM | POA: Diagnosis not present

## 2022-10-15 DIAGNOSIS — R9082 White matter disease, unspecified: Secondary | ICD-10-CM | POA: Diagnosis not present

## 2022-10-15 DIAGNOSIS — Z87891 Personal history of nicotine dependence: Secondary | ICD-10-CM | POA: Diagnosis not present

## 2022-10-15 DIAGNOSIS — R161 Splenomegaly, not elsewhere classified: Secondary | ICD-10-CM | POA: Diagnosis not present

## 2022-10-15 DIAGNOSIS — R0602 Shortness of breath: Secondary | ICD-10-CM | POA: Diagnosis not present

## 2022-10-15 MED ORDER — HEPARIN SOD (PORK) LOCK FLUSH 100 UNIT/ML IV SOLN
500.0000 [IU] | Freq: Every day | INTRAVENOUS | Status: AC | PRN
  Administered 2022-10-15: 500 [IU]
  Filled 2022-10-15: qty 5

## 2022-10-15 MED ORDER — SODIUM CHLORIDE 0.9% IV SOLUTION
250.0000 mL | Freq: Once | INTRAVENOUS | Status: AC
  Administered 2022-10-15: 250 mL via INTRAVENOUS
  Filled 2022-10-15: qty 250

## 2022-10-15 MED ORDER — ACETAMINOPHEN 325 MG PO TABS
650.0000 mg | ORAL_TABLET | Freq: Once | ORAL | Status: AC
  Administered 2022-10-15: 650 mg via ORAL
  Filled 2022-10-15: qty 2

## 2022-10-15 MED ORDER — SODIUM CHLORIDE 0.9% FLUSH
3.0000 mL | INTRAVENOUS | Status: DC | PRN
  Filled 2022-10-15: qty 3

## 2022-10-15 NOTE — Telephone Encounter (Signed)
Natasha [name] and asked if pt taking the acalabrutinib 1 tablet 1 in am and 1 in pm every day. [name] called her mom and says that the pt states that she takes it every day 1 in am and 1 in pm and she did not take it today because she was told because she is coming over here for transfusion to not take it this am

## 2022-10-16 LAB — TYPE AND SCREEN
ABO/RH(D): O POS
Antibody Screen: NEGATIVE
Unit division: 0

## 2022-10-16 LAB — BPAM RBC
Blood Product Expiration Date: 202401022359
ISSUE DATE / TIME: 202312220920
Unit Type and Rh: 9500

## 2022-10-19 DIAGNOSIS — R0602 Shortness of breath: Secondary | ICD-10-CM | POA: Diagnosis not present

## 2022-10-20 ENCOUNTER — Ambulatory Visit (HOSPITAL_COMMUNITY)
Admission: RE | Admit: 2022-10-20 | Discharge: 2022-10-20 | Disposition: A | Discharge status: Home / Self Care | Payer: Medicare Other | Source: Ambulatory Visit | Attending: Oncology | Admitting: Oncology

## 2022-10-20 ENCOUNTER — Other Ambulatory Visit: Payer: Self-pay

## 2022-10-20 ENCOUNTER — Emergency Department: Payer: Medicare Other

## 2022-10-20 ENCOUNTER — Inpatient Hospital Stay
Admission: EM | Admit: 2022-10-20 | Discharge: 2022-10-28 | DRG: 871 | Disposition: A | Discharge status: Home Health | Payer: Medicare Other | Attending: Internal Medicine | Admitting: Internal Medicine

## 2022-10-20 DIAGNOSIS — J440 Chronic obstructive pulmonary disease with acute lower respiratory infection: Secondary | ICD-10-CM | POA: Diagnosis present

## 2022-10-20 DIAGNOSIS — I5A Non-ischemic myocardial injury (non-traumatic): Secondary | ICD-10-CM | POA: Diagnosis not present

## 2022-10-20 DIAGNOSIS — J9601 Acute respiratory failure with hypoxia: Secondary | ICD-10-CM | POA: Diagnosis not present

## 2022-10-20 DIAGNOSIS — R6520 Severe sepsis without septic shock: Secondary | ICD-10-CM | POA: Diagnosis not present

## 2022-10-20 DIAGNOSIS — D696 Thrombocytopenia, unspecified: Secondary | ICD-10-CM | POA: Diagnosis not present

## 2022-10-20 DIAGNOSIS — D6481 Anemia due to antineoplastic chemotherapy: Secondary | ICD-10-CM | POA: Diagnosis present

## 2022-10-20 DIAGNOSIS — Z87891 Personal history of nicotine dependence: Secondary | ICD-10-CM | POA: Diagnosis not present

## 2022-10-20 DIAGNOSIS — M79604 Pain in right leg: Secondary | ICD-10-CM | POA: Diagnosis not present

## 2022-10-20 DIAGNOSIS — J441 Chronic obstructive pulmonary disease with (acute) exacerbation: Secondary | ICD-10-CM | POA: Diagnosis present

## 2022-10-20 DIAGNOSIS — D61818 Other pancytopenia: Secondary | ICD-10-CM | POA: Diagnosis present

## 2022-10-20 DIAGNOSIS — J09X1 Influenza due to identified novel influenza A virus with pneumonia: Secondary | ICD-10-CM | POA: Diagnosis present

## 2022-10-20 DIAGNOSIS — R509 Fever, unspecified: Secondary | ICD-10-CM | POA: Diagnosis not present

## 2022-10-20 DIAGNOSIS — Z9981 Dependence on supplemental oxygen: Secondary | ICD-10-CM

## 2022-10-20 DIAGNOSIS — Z1152 Encounter for screening for COVID-19: Secondary | ICD-10-CM | POA: Diagnosis not present

## 2022-10-20 DIAGNOSIS — Z6825 Body mass index (BMI) 25.0-25.9, adult: Secondary | ICD-10-CM | POA: Diagnosis not present

## 2022-10-20 DIAGNOSIS — R0902 Hypoxemia: Secondary | ICD-10-CM | POA: Diagnosis not present

## 2022-10-20 DIAGNOSIS — J101 Influenza due to other identified influenza virus with other respiratory manifestations: Secondary | ICD-10-CM | POA: Diagnosis not present

## 2022-10-20 DIAGNOSIS — Z9289 Personal history of other medical treatment: Secondary | ICD-10-CM | POA: Diagnosis not present

## 2022-10-20 DIAGNOSIS — E1122 Type 2 diabetes mellitus with diabetic chronic kidney disease: Secondary | ICD-10-CM | POA: Diagnosis present

## 2022-10-20 DIAGNOSIS — A4189 Other specified sepsis: Secondary | ICD-10-CM | POA: Diagnosis not present

## 2022-10-20 DIAGNOSIS — J1001 Influenza due to other identified influenza virus with the same other identified influenza virus pneumonia: Secondary | ICD-10-CM | POA: Diagnosis present

## 2022-10-20 DIAGNOSIS — D709 Neutropenia, unspecified: Principal | ICD-10-CM | POA: Diagnosis present

## 2022-10-20 DIAGNOSIS — J9622 Acute and chronic respiratory failure with hypercapnia: Secondary | ICD-10-CM | POA: Diagnosis present

## 2022-10-20 DIAGNOSIS — R6889 Other general symptoms and signs: Secondary | ICD-10-CM | POA: Diagnosis not present

## 2022-10-20 DIAGNOSIS — N179 Acute kidney failure, unspecified: Secondary | ICD-10-CM | POA: Diagnosis not present

## 2022-10-20 DIAGNOSIS — Z79890 Hormone replacement therapy (postmenopausal): Secondary | ICD-10-CM

## 2022-10-20 DIAGNOSIS — I5032 Chronic diastolic (congestive) heart failure: Secondary | ICD-10-CM | POA: Insufficient documentation

## 2022-10-20 DIAGNOSIS — J90 Pleural effusion, not elsewhere classified: Secondary | ICD-10-CM

## 2022-10-20 DIAGNOSIS — E039 Hypothyroidism, unspecified: Secondary | ICD-10-CM | POA: Diagnosis present

## 2022-10-20 DIAGNOSIS — C8580 Other specified types of non-Hodgkin lymphoma, unspecified site: Secondary | ICD-10-CM | POA: Diagnosis not present

## 2022-10-20 DIAGNOSIS — C8300 Small cell B-cell lymphoma, unspecified site: Secondary | ICD-10-CM | POA: Diagnosis not present

## 2022-10-20 DIAGNOSIS — J9602 Acute respiratory failure with hypercapnia: Secondary | ICD-10-CM | POA: Diagnosis not present

## 2022-10-20 DIAGNOSIS — I5033 Acute on chronic diastolic (congestive) heart failure: Secondary | ICD-10-CM | POA: Diagnosis present

## 2022-10-20 DIAGNOSIS — J9621 Acute and chronic respiratory failure with hypoxia: Secondary | ICD-10-CM | POA: Diagnosis present

## 2022-10-20 DIAGNOSIS — R0602 Shortness of breath: Secondary | ICD-10-CM | POA: Diagnosis not present

## 2022-10-20 DIAGNOSIS — M79605 Pain in left leg: Secondary | ICD-10-CM | POA: Diagnosis not present

## 2022-10-20 DIAGNOSIS — Z820 Family history of epilepsy and other diseases of the nervous system: Secondary | ICD-10-CM

## 2022-10-20 DIAGNOSIS — J9811 Atelectasis: Secondary | ICD-10-CM | POA: Diagnosis not present

## 2022-10-20 DIAGNOSIS — E1129 Type 2 diabetes mellitus with other diabetic kidney complication: Secondary | ICD-10-CM | POA: Diagnosis present

## 2022-10-20 DIAGNOSIS — R000 Tachycardia, unspecified: Secondary | ICD-10-CM | POA: Diagnosis not present

## 2022-10-20 DIAGNOSIS — Z794 Long term (current) use of insulin: Secondary | ICD-10-CM

## 2022-10-20 DIAGNOSIS — Z803 Family history of malignant neoplasm of breast: Secondary | ICD-10-CM

## 2022-10-20 DIAGNOSIS — R5081 Fever presenting with conditions classified elsewhere: Secondary | ICD-10-CM | POA: Diagnosis not present

## 2022-10-20 DIAGNOSIS — I9589 Other hypotension: Secondary | ICD-10-CM | POA: Diagnosis present

## 2022-10-20 DIAGNOSIS — T451X5A Adverse effect of antineoplastic and immunosuppressive drugs, initial encounter: Secondary | ICD-10-CM | POA: Diagnosis not present

## 2022-10-20 DIAGNOSIS — D509 Iron deficiency anemia, unspecified: Secondary | ICD-10-CM | POA: Diagnosis not present

## 2022-10-20 DIAGNOSIS — E663 Overweight: Secondary | ICD-10-CM | POA: Diagnosis not present

## 2022-10-20 DIAGNOSIS — A419 Sepsis, unspecified organism: Secondary | ICD-10-CM | POA: Diagnosis not present

## 2022-10-20 DIAGNOSIS — Z8249 Family history of ischemic heart disease and other diseases of the circulatory system: Secondary | ICD-10-CM

## 2022-10-20 DIAGNOSIS — Z79899 Other long term (current) drug therapy: Secondary | ICD-10-CM

## 2022-10-20 DIAGNOSIS — R059 Cough, unspecified: Secondary | ICD-10-CM | POA: Diagnosis not present

## 2022-10-20 DIAGNOSIS — R911 Solitary pulmonary nodule: Secondary | ICD-10-CM | POA: Diagnosis not present

## 2022-10-20 DIAGNOSIS — N1831 Chronic kidney disease, stage 3a: Secondary | ICD-10-CM | POA: Diagnosis not present

## 2022-10-20 DIAGNOSIS — Z743 Need for continuous supervision: Secondary | ICD-10-CM | POA: Diagnosis not present

## 2022-10-20 DIAGNOSIS — Z801 Family history of malignant neoplasm of trachea, bronchus and lung: Secondary | ICD-10-CM

## 2022-10-20 LAB — CBG MONITORING, ED
Glucose-Capillary: 270 mg/dL — ABNORMAL HIGH (ref 70–99)
Glucose-Capillary: 272 mg/dL — ABNORMAL HIGH (ref 70–99)
Glucose-Capillary: 288 mg/dL — ABNORMAL HIGH (ref 70–99)

## 2022-10-20 LAB — COMPREHENSIVE METABOLIC PANEL
ALT: 11 U/L (ref 0–44)
AST: 21 U/L (ref 15–41)
Albumin: 3.1 g/dL — ABNORMAL LOW (ref 3.5–5.0)
Alkaline Phosphatase: 200 U/L — ABNORMAL HIGH (ref 38–126)
Anion gap: 8 (ref 5–15)
BUN: 33 mg/dL — ABNORMAL HIGH (ref 8–23)
CO2: 24 mmol/L (ref 22–32)
Calcium: 8.6 mg/dL — ABNORMAL LOW (ref 8.9–10.3)
Chloride: 107 mmol/L (ref 98–111)
Creatinine, Ser: 0.86 mg/dL (ref 0.44–1.00)
GFR, Estimated: >60 mL/min (ref >60)
Glucose, Bld: 206 mg/dL — ABNORMAL HIGH (ref 70–99)
Potassium: 4.5 mmol/L (ref 3.5–5.1)
Sodium: 139 mmol/L (ref 135–145)
Total Bilirubin: 1.3 mg/dL — ABNORMAL HIGH (ref 0.3–1.2)
Total Protein: 5.7 g/dL — ABNORMAL LOW (ref 6.5–8.1)

## 2022-10-20 LAB — CBC WITH DIFFERENTIAL/PLATELET
Abs Immature Granulocytes: 0.03 10*3/uL (ref 0.00–0.07)
Basophils Absolute: 0.1 10*3/uL (ref 0.0–0.1)
Basophils Relative: 2 %
Eosinophils Absolute: 0 10*3/uL (ref 0.0–0.5)
Eosinophils Relative: 1 %
HCT: 26.6 % — ABNORMAL LOW (ref 36.0–46.0)
Hemoglobin: 7.5 g/dL — ABNORMAL LOW (ref 12.0–15.0)
Immature Granulocytes: 1 %
Lymphocytes Relative: 85 %
Lymphs Abs: 3.8 10*3/uL (ref 0.7–4.0)
MCH: 27.4 pg (ref 26.0–34.0)
MCHC: 28.2 g/dL — ABNORMAL LOW (ref 30.0–36.0)
MCV: 97.1 fL (ref 80.0–100.0)
Monocytes Absolute: 0.4 10*3/uL (ref 0.1–1.0)
Monocytes Relative: 10 %
Neutro Abs: 0 10*3/uL — CL (ref 1.7–7.7)
Neutrophils Relative %: 1 %
Platelets: 99 10*3/uL — ABNORMAL LOW (ref 150–400)
RBC: 2.74 MIL/uL — ABNORMAL LOW (ref 3.87–5.11)
RDW: 18.3 % — ABNORMAL HIGH (ref 11.5–15.5)
Smear Review: NORMAL
WBC Morphology: ABNORMAL
WBC: 4.6 10*3/uL (ref 4.0–10.5)
nRBC: 2.4 % — ABNORMAL HIGH (ref 0.0–0.2)

## 2022-10-20 LAB — BLOOD GAS, VENOUS
Acid-Base Excess: 1.9 mmol/L (ref 0.0–2.0)
Bicarbonate: 31 mmol/L — ABNORMAL HIGH (ref 20.0–28.0)
O2 Saturation: 61.5 %
Patient temperature: 37
pCO2, Ven: 69 mmHg — ABNORMAL HIGH (ref 44–60)
pH, Ven: 7.26 (ref 7.25–7.43)
pO2, Ven: 43 mmHg (ref 32–45)

## 2022-10-20 LAB — APTT: aPTT: 23 seconds — ABNORMAL LOW (ref 24–36)

## 2022-10-20 LAB — RESP PANEL BY RT-PCR (FLU A&B, COVID) ARPGX2
Influenza A by PCR: POSITIVE — AB
Influenza B by PCR: NEGATIVE
SARS Coronavirus 2 by RT PCR: NEGATIVE

## 2022-10-20 LAB — BRAIN NATRIURETIC PEPTIDE: B Natriuretic Peptide: 348.6 pg/mL — ABNORMAL HIGH (ref 0.0–100.0)

## 2022-10-20 LAB — TROPONIN I (HIGH SENSITIVITY): Troponin I (High Sensitivity): 18 ng/L — ABNORMAL HIGH (ref <18)

## 2022-10-20 LAB — LIPASE, BLOOD: Lipase: 29 U/L (ref 11–51)

## 2022-10-20 LAB — PROTIME-INR
INR: 1.2 (ref 0.8–1.2)
Prothrombin Time: 15.5 seconds — ABNORMAL HIGH (ref 11.4–15.2)

## 2022-10-20 LAB — LACTIC ACID, PLASMA: Lactic Acid, Venous: 0.9 mmol/L (ref 0.5–1.9)

## 2022-10-20 MED ORDER — MIDODRINE HCL 5 MG PO TABS
5.0000 mg | ORAL_TABLET | Freq: Once | ORAL | Status: AC
  Administered 2022-10-20: 5 mg via ORAL
  Filled 2022-10-20: qty 1

## 2022-10-20 MED ORDER — LACTATED RINGERS IV BOLUS
1000.0000 mL | Freq: Once | INTRAVENOUS | Status: AC
  Administered 2022-10-20: 1000 mL via INTRAVENOUS

## 2022-10-20 MED ORDER — INSULIN ASPART 100 UNIT/ML IJ SOLN
0.0000 [IU] | Freq: Every day | INTRAMUSCULAR | Status: DC
  Administered 2022-10-21 – 2022-10-22 (×3): 3 [IU] via SUBCUTANEOUS
  Administered 2022-10-23 – 2022-10-24 (×2): 4 [IU] via SUBCUTANEOUS
  Filled 2022-10-20 (×5): qty 1

## 2022-10-20 MED ORDER — VANCOMYCIN HCL IN DEXTROSE 1-5 GM/200ML-% IV SOLN: 1000.0000 mg | INTRAVENOUS | Status: DC

## 2022-10-20 MED ORDER — IPRATROPIUM-ALBUTEROL 0.5-2.5 (3) MG/3ML IN SOLN
3.0000 mL | Freq: Once | RESPIRATORY_TRACT | Status: AC
  Administered 2022-10-20: 3 mL via RESPIRATORY_TRACT
  Filled 2022-10-20: qty 3

## 2022-10-20 MED ORDER — METHYLPREDNISOLONE SODIUM SUCC 40 MG IJ SOLR
40.0000 mg | Freq: Two times a day (BID) | INTRAMUSCULAR | Status: DC
  Administered 2022-10-20 – 2022-10-24 (×9): 40 mg via INTRAVENOUS
  Filled 2022-10-20 (×9): qty 1

## 2022-10-20 MED ORDER — IPRATROPIUM-ALBUTEROL 0.5-2.5 (3) MG/3ML IN SOLN
3.0000 mL | RESPIRATORY_TRACT | Status: DC
  Administered 2022-10-20 – 2022-10-22 (×14): 3 mL via RESPIRATORY_TRACT
  Filled 2022-10-20 (×14): qty 3

## 2022-10-20 MED ORDER — OSELTAMIVIR PHOSPHATE 75 MG PO CAPS
75.0000 mg | ORAL_CAPSULE | Freq: Once | ORAL | Status: DC
  Filled 2022-10-20: qty 1

## 2022-10-20 MED ORDER — LOPERAMIDE HCL 2 MG PO CAPS: 2.0000 mg | ORAL_CAPSULE | ORAL | Status: DC | PRN

## 2022-10-20 MED ORDER — INSULIN ASPART PROT & ASPART (70-30 MIX) 100 UNIT/ML ~~LOC~~ SUSP
4.0000 [IU] | Freq: Two times a day (BID) | SUBCUTANEOUS | Status: DC
  Administered 2022-10-21 – 2022-10-24 (×8): 4 [IU] via SUBCUTANEOUS
  Filled 2022-10-20 (×2): qty 10

## 2022-10-20 MED ORDER — SODIUM CHLORIDE 0.9 % IV SOLN
2.0000 g | INTRAVENOUS | Status: AC
  Administered 2022-10-20: 2 g via INTRAVENOUS
  Filled 2022-10-20: qty 12.5

## 2022-10-20 MED ORDER — LEVOTHYROXINE SODIUM 50 MCG PO TABS
50.0000 ug | ORAL_TABLET | Freq: Every day | ORAL | Status: DC
  Administered 2022-10-21 – 2022-10-28 (×8): 50 ug via ORAL
  Filled 2022-10-20 (×8): qty 1

## 2022-10-20 MED ORDER — OSELTAMIVIR PHOSPHATE 75 MG PO CAPS
75.0000 mg | ORAL_CAPSULE | Freq: Two times a day (BID) | ORAL | Status: DC
  Administered 2022-10-21: 75 mg via ORAL
  Filled 2022-10-20: qty 1

## 2022-10-20 MED ORDER — METHYLPREDNISOLONE SODIUM SUCC 125 MG IJ SOLR
125.0000 mg | INTRAMUSCULAR | Status: AC
  Administered 2022-10-20: 125 mg via INTRAVENOUS
  Filled 2022-10-20: qty 2

## 2022-10-20 MED ORDER — FUROSEMIDE 20 MG PO TABS
20.0000 mg | ORAL_TABLET | Freq: Every day | ORAL | Status: DC
  Administered 2022-10-22: 20 mg via ORAL
  Filled 2022-10-20 (×2): qty 1

## 2022-10-20 MED ORDER — VANCOMYCIN HCL 1250 MG/250ML IV SOLN
1250.0000 mg | INTRAVENOUS | Status: DC
  Filled 2022-10-20: qty 250

## 2022-10-20 MED ORDER — INSULIN ASPART 100 UNIT/ML IJ SOLN
0.0000 [IU] | Freq: Three times a day (TID) | INTRAMUSCULAR | Status: DC
  Administered 2022-10-20 – 2022-10-21 (×2): 5 [IU] via SUBCUTANEOUS
  Administered 2022-10-21: 2 [IU] via SUBCUTANEOUS
  Administered 2022-10-21: 5 [IU] via SUBCUTANEOUS
  Administered 2022-10-22 (×3): 3 [IU] via SUBCUTANEOUS
  Administered 2022-10-23: 5 [IU] via SUBCUTANEOUS
  Administered 2022-10-23: 7 [IU] via SUBCUTANEOUS
  Administered 2022-10-23: 5 [IU] via SUBCUTANEOUS
  Administered 2022-10-24: 3 [IU] via SUBCUTANEOUS
  Administered 2022-10-24 (×2): 9 [IU] via SUBCUTANEOUS
  Administered 2022-10-25 (×2): 5 [IU] via SUBCUTANEOUS
  Administered 2022-10-25: 2 [IU] via SUBCUTANEOUS
  Administered 2022-10-26 (×2): 1 [IU] via SUBCUTANEOUS
  Administered 2022-10-27: 3 [IU] via SUBCUTANEOUS
  Administered 2022-10-27: 1 [IU] via SUBCUTANEOUS
  Administered 2022-10-28: 2 [IU] via SUBCUTANEOUS
  Filled 2022-10-20 (×21): qty 1

## 2022-10-20 MED ORDER — VANCOMYCIN HCL 1750 MG/350ML IV SOLN
1750.0000 mg | Freq: Once | INTRAVENOUS | Status: AC
  Administered 2022-10-20: 1750 mg via INTRAVENOUS
  Filled 2022-10-20: qty 350

## 2022-10-20 MED ORDER — CLOBETASOL PROPIONATE 0.05 % EX CREA
1.0000 | TOPICAL_CREAM | Freq: Every day | CUTANEOUS | Status: DC
  Administered 2022-10-23 – 2022-10-28 (×4): 1 via TOPICAL
  Filled 2022-10-20: qty 15

## 2022-10-20 MED ORDER — ALBUTEROL SULFATE (2.5 MG/3ML) 0.083% IN NEBU: 2.5000 mg | INHALATION_SOLUTION | RESPIRATORY_TRACT | Status: DC | PRN

## 2022-10-20 MED ORDER — TBO-FILGRASTIM 480 MCG/0.8ML ~~LOC~~ SOSY
480.0000 ug | PREFILLED_SYRINGE | Freq: Every day | SUBCUTANEOUS | Status: DC
  Administered 2022-10-20: 480 ug via SUBCUTANEOUS
  Filled 2022-10-20 (×5): qty 0.8

## 2022-10-20 MED ORDER — HYDROCOD POLI-CHLORPHE POLI ER 10-8 MG/5ML PO SUER
5.0000 mL | Freq: Two times a day (BID) | ORAL | Status: DC
  Administered 2022-10-21 – 2022-10-28 (×15): 5 mL via ORAL
  Filled 2022-10-20 (×15): qty 5

## 2022-10-20 MED ORDER — ACYCLOVIR 200 MG PO CAPS
400.0000 mg | ORAL_CAPSULE | Freq: Every day | ORAL | Status: DC
  Administered 2022-10-21 – 2022-10-28 (×8): 400 mg via ORAL
  Filled 2022-10-20 (×8): qty 2

## 2022-10-20 MED ORDER — ONDANSETRON HCL 4 MG/2ML IJ SOLN: 4.0000 mg | Freq: Three times a day (TID) | INTRAMUSCULAR | Status: DC | PRN

## 2022-10-20 MED ORDER — DM-GUAIFENESIN ER 30-600 MG PO TB12: 1.0000 | ORAL_TABLET | Freq: Two times a day (BID) | ORAL | Status: DC | PRN

## 2022-10-20 MED ORDER — SIMETHICONE 80 MG PO CHEW: 80.0000 mg | CHEWABLE_TABLET | Freq: Four times a day (QID) | ORAL | Status: DC | PRN

## 2022-10-20 MED ORDER — ACETAMINOPHEN 325 MG PO TABS
650.0000 mg | ORAL_TABLET | Freq: Four times a day (QID) | ORAL | Status: DC | PRN
  Administered 2022-10-26: 650 mg via ORAL
  Filled 2022-10-20: qty 2

## 2022-10-20 MED ORDER — ACETAMINOPHEN 500 MG PO TABS: 1000.0000 mg | ORAL_TABLET | ORAL | Status: AC

## 2022-10-20 MED ORDER — SODIUM CHLORIDE 0.9 % IV SOLN
2.0000 g | Freq: Two times a day (BID) | INTRAVENOUS | Status: DC
  Administered 2022-10-20 – 2022-10-22 (×4): 2 g via INTRAVENOUS
  Filled 2022-10-20 (×4): qty 12.5

## 2022-10-20 NOTE — ED Triage Notes (Signed)
Pt presents to the ED via [facility] from home due to SOB x2 days. Pt has hx CHF. Pt wears 3L O2 [place] at baseline. Pt appears to have a cough and bilateral edema lower extremities. Pt appears drowsy. Pt was 68% RA, 10L NRB placed 100%. Pt A&Ox4

## 2022-10-20 NOTE — H&P (Signed)
History and Physical    Natasha [name] KWI:[id number] DOB: 1946-09-12 DOA: 10/20/2022  Referring MD/NP/PA:   PCP: [name], NP   Patient coming from:  The patient is coming from home.    Chief Complaint: SOB, fever.   HPI: Natasha [name] is a 76 y.o. 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  Patient states that she has been sick for more than 2 days.  She has productive cough, shortness breath, fever, chills, generalized weakness, body aches, malaise.  Her temperature is 101.1 in ED. She states that she had some chest pain earlier, which has resolved.  Currently no active chest pain.  She has nausea, denies vomiting, diarrhea or abdominal pain.  No symptoms of UTI.  Patient is using 3 L oxygen normally at home.  Patient was found to have oxygen desaturation to 68% initially, nonrebreather was started, but still has severe respiratory distress, in tripoding position, using accessory muscle for breathing, cannot speak in full sentence, BiPAP is started in ED.  Data reviewed independently and ED Course: pt was found to have positive flu A PCR, neutropenia (WBC 4.6 with 1% of neutrophil), hemoglobin 7.5, platelet 99, INR 1.2, PTT 23, BNP 348.6, troponin 18, temperature one 1.1, blood pressure 118/59, heart rate 109, RR 22.  VBG with pH 7.26, CO2 69, O2 43.  Chest x-ray with bilateral pleural effusion and vascular congestion.  Patient is admitted to stepdown as inpatient. Consulted Dr. Janese Banks of oncology.  EKG: I have personally reviewed.  Sinus rhythm, QTc 412, low voltage.   Review of Systems:   General: has fevers, chills, no body weight gain, has poor appetite, has fatigue HEENT: no blurry vision, hearing changes or sore throat Respiratory: has dyspnea, coughing,  wheezing CV: had chest pain, no palpitations GI: has nausea, no vomiting, abdominal pain, diarrhea, constipation GU: no dysuria, burning on urination, increased urinary frequency, hematuria  Ext: has leg edema Neuro: no unilateral weakness, numbness, or tingling, no vision change or hearing loss Skin: no rash, no skin tear. MSK: No muscle spasm, no deformity, no limitation of range of movement in spin Heme: No easy bruising.  Travel history: No recent long distant travel.   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 Her smoking use included cigarettes. She started smoking about 61 years ago. She has a 110.00 pack-year smoking history. She has never used smokeless tobacco. She reports that she does not drink alcohol and does not use drugs.  Family History:  Family History  Problem Relation Age of Onset   Breast cancer Mother    Alzheimer's disease Father    Lung cancer Father  lung   Varicose Veins Brother    Heart attack Brother      Prior to Admission medications   Medication Sig Start Date End Date Taking? Authorizing Provider  albuterol (PROVENTIL) (2.5 MG/3ML) 0.083% nebulizer solution Take 2.5 mg by nebulization every 4 (four) hours as needed. 08/05/22  Yes [provider]  acalabrutinib maleate (CALQUENCE) 100 MG tablet Take 1 tablet (100 mg total) by mouth 2 (two) times daily. 08/30/22   Sindy Guadeloupe, MD   acyclovir (ZOVIRAX) 400 MG tablet Take 400 mg by mouth daily. 08/09/22   [provider]  albuterol (VENTOLIN HFA) 108 (90 Base) MCG/ACT inhaler TAKE 2 PUFFS BY MOUTH EVERY 6 HOURS AS NEEDED FOR WHEEZE OR SHORTNESS OF BREATH 06/17/22   Sreenath, Sudheer B, MD  clobetasol cream (TEMOVATE) 1.61 % Apply 1 Application topically daily. 08/25/22   [provider]  furosemide (LASIX) 20 MG tablet Take 1 tablet (20 mg total) by mouth daily. 09/09/22   Alisa Graff, FNP  insulin NPH-regular Human (70-30) 100 UNIT/ML injection Inject 5 Units into the skin 2 (two) times daily with a meal. Use only for hyperglycemia, CBG+ 300. Start with 5 units, titrate up to max 10 unit per dose if need. 09/21/22   Loletha Grayer, MD  ipratropium-albuterol (DUONEB) 0.5-2.5 (3) MG/3ML SOLN Take 3 mLs by nebulization every 6 (six) hours as needed (shortness of breath or wheezing). 08/22/22   [provider]  levothyroxine (SYNTHROID) 50 MCG tablet Take 1 tablet (50 mcg total) by mouth daily at 6 (six) AM. 07/17/22   Sharen Hones, MD  lidocaine-prilocaine (EMLA) cream Apply to affected area once Patient taking differently: 1 Application. Apply to mediport site 2 hours before infusion 07/28/22   Sindy Guadeloupe, MD  loperamide (IMODIUM) 2 MG capsule Take 2 mg by mouth as needed for diarrhea or loose stools.    [provider]  nicotine (NICODERM CQ - DOSED IN MG/24 HOURS) 21 mg/24hr patch Place 21 mg onto the skin daily. Patient not taking: Reported on 10/01/2022    [provider]  ondansetron (ZOFRAN) 8 MG tablet Take 8 mg by mouth every 8 (eight) hours as needed for nausea, vomiting or refractory nausea / vomiting. Take 1 tablet q6H for 4 days after chemotherapy. 08/09/22   [provider]  prochlorperazine (COMPAZINE) 10 MG tablet Take 1 tablet by mouth every 6 (six) hours as needed for nausea or vomiting. Take 1 tab q6H for 4 days after chemotherapy. 08/09/22   [provider]  simethicone (MYLICON) 80 MG chewable tablet Chew 180 mg by mouth every 6 (six) hours as needed for flatulence. Take 1 capsule PO PRN gas relief.    [provider]    Physical Exam: Vitals:   10/20/22 1257 10/20/22 1300 10/20/22 1400 10/20/22 1409  BP:  (!) 110/46    Pulse:  (!) 104 (!) 107 (!) 103  Resp:  (!) 23 (!) 23 (!) 23  Temp: 99.4 F (37.4 C)     TempSrc: Axillary     SpO2:  100% 100% 100%  Weight:       General: In severe respiratory distress HEENT:       Eyes: PERRL, EOMI, no scleral icterus.       ENT: No discharge from the ears and nose, no pharynx injection, no tonsillar enlargement.        Neck: No JVD, no bruit, no mass felt. Heme: No neck lymph node enlargement. Cardiac: S1/S2, RRR,  No murmurs, No gallops or rubs. Respiratory: Has wheezing bilaterally GI: Soft, nondistended, nontender, no rebound pain, no organomegaly, BS present. GU: No hematuria Ext: has 1+ pitting leg edema bilaterally. 1+DP/PT pulse bilaterally. Musculoskeletal: No joint deformities, No joint redness or warmth, no limitation of ROM in spin. Skin: No rashes.  Neuro: Alert, oriented X3, cranial nerves II-XII grossly intact, moves all extremities normally.   Psych: Patient is not psychotic, no suicidal or hemocidal ideation.  Labs on Admission: I have personally reviewed following labs and imaging studies  CBC: Recent Labs  Lab 10/14/22 1013 10/20/22 0945  WBC 4.1 4.6  NEUTROABS 0.2* 0.0*  HGB 6.4* 7.5*  HCT 21.4* 26.6*  MCV 98.2 97.1  PLT 80* 99*   Basic Metabolic Panel: Recent Labs  Lab 10/14/22 1013 10/20/22 0945  NA 141 139  K 4.4 4.5  CL 107 107  CO2 28 24  GLUCOSE 176* 206*  BUN 28* 33*  CREATININE 0.86 0.86  CALCIUM 8.1* 8.6*   GFR: Estimated Creatinine Clearance: 58 mL/min (by C-G formula based on SCr of 0.86 mg/dL). Liver Function Tests: Recent Labs  Lab 10/14/22 1013 10/20/22 0945  AST 15 21  ALT 12 11  ALKPHOS 161* 200*   BILITOT 1.3* 1.3*  PROT 5.1* 5.7*  ALBUMIN 3.0* 3.1*   Recent Labs  Lab 10/20/22 0945  LIPASE 29   No results for input(s): "AMMONIA" in the last 168 hours. Coagulation Profile: Recent Labs  Lab 10/20/22 0945  INR 1.2   Cardiac Enzymes: No results for input(s): "CKTOTAL", "CKMB", "CKMBINDEX", "TROPONINI" in the last 168 hours. BNP (last 3 results) No results for input(s): "PROBNP" in the last 8760 hours. HbA1C: No results for input(s): "HGBA1C" in the last 72 hours. CBG: No results for input(s): "GLUCAP" in the last 168 hours. Lipid Profile: No results for input(s): "CHOL", "HDL", "LDLCALC", "TRIG", "CHOLHDL", "LDLDIRECT" in the last 72 hours. Thyroid Function Tests: No results for input(s): "TSH", "T4TOTAL", "FREET4", "T3FREE", "THYROIDAB" in the last 72 hours. Anemia Panel: No results for input(s): "VITAMINB12", "FOLATE", "FERRITIN", "TIBC", "IRON", "RETICCTPCT" in the last 72 hours. Urine analysis:    Component Value Date/Time   COLORURINE AMBER (A) 06/11/2018 1308   APPEARANCEUR CLOUDY (A) 06/11/2018 1308   LABSPEC 1.022 06/11/2018 1308   PHURINE 5.0 06/11/2018 1308   GLUCOSEU >=500 (A) 06/11/2018 1308   HGBUR LARGE (A) 06/11/2018 1308   BILIRUBINUR NEGATIVE 06/11/2018 1308   KETONESUR NEGATIVE 06/11/2018 1308   PROTEINUR 30 (A) 06/11/2018 1308   NITRITE NEGATIVE 06/11/2018 1308   LEUKOCYTESUR LARGE (A) 06/11/2018 1308   Sepsis Labs: '@LABRCNTIP'$ (procalcitonin:4,lacticidven:4) ) Recent Results (from the past 240 hour(s))  Resp Panel by RT-PCR (Flu A&B, Covid) Anterior Nasal Swab     Status: Abnormal   Collection Time: 10/20/22  9:45 AM   Specimen: Anterior Nasal Swab  Result Value Ref Range Status   SARS Coronavirus 2 by RT PCR NEGATIVE NEGATIVE Final    Comment: (NOTE) SARS-CoV-2 target nucleic acids are NOT DETECTED.  The SARS-CoV-2 RNA is generally detectable in upper respiratory specimens during the acute phase of infection. The lowest concentration  of SARS-CoV-2 viral copies this assay can detect is 138 copies/mL. A negative result does not preclude SARS-Cov-2 infection and should not be used as the sole basis for treatment or other patient management decisions. A negative result may occur with  improper specimen collection/handling, submission of specimen other than nasopharyngeal swab, presence of viral mutation(s) within the areas targeted by this assay, and inadequate  number of viral copies(<138 copies/mL). A negative result must be combined with clinical observations, patient history, and epidemiological information. The expected result is Negative.  Fact Sheet for Patients:  EntrepreneurPulse.com.au  Fact Sheet for Healthcare Providers:  IncredibleEmployment.be  This test is no t yet approved or cleared by the [place] FDA and  has been authorized for detection and/or diagnosis of SARS-CoV-2 by FDA under an Emergency Use Authorization (EUA). This EUA will remain  in effect (meaning this test can be used) for the duration of the COVID-19 declaration under Section 564(b)(1) of the Act, 21 U.S.C.section 360bbb-3(b)(1), unless the authorization is terminated  or revoked sooner.       Influenza A by PCR POSITIVE (A) NEGATIVE Final   Influenza B by PCR NEGATIVE NEGATIVE Final    Comment: (NOTE) The Xpert Xpress SARS-CoV-2/FLU/RSV plus assay is intended as an aid in the diagnosis of influenza from Nasopharyngeal swab specimens and should not be used as a sole basis for treatment. Nasal washings and aspirates are unacceptable for Xpert Xpress SARS-CoV-2/FLU/RSV testing.  Fact Sheet for Patients: EntrepreneurPulse.com.au  Fact Sheet for Healthcare Providers: IncredibleEmployment.be  This test is not yet approved or cleared by the [place] FDA and has been authorized for detection and/or diagnosis of SARS-CoV-2 by FDA under an Emergency Use  Authorization (EUA). This EUA will remain in effect (meaning this test can be used) for the duration of the COVID-19 declaration under Section 564(b)(1) of the Act, 21 U.S.C. section 360bbb-3(b)(1), unless the authorization is terminated or revoked.  Performed at [facility], [address], [place], [place] [address]      Radiological Exams on Admission: DG [place] 1 View  Result Date: 10/20/2022 CLINICAL DATA:  76 year old female with possible sepsis. EXAM: PORTABLE CHEST 1 VIEW COMPARISON:  Portable chest 09/20/2022 and earlier. FINDINGS: Portable AP semi upright view at 1019 hours. Ongoing moderate bilateral pleural effusions, not significantly changed. Right chest power port redemonstrated. Stable visible mediastinal contours. Partially calcified right lung nodule appears stable from a a chest CT 04/24/2022 (please see that report). Pulmonary vascularity appears stable since November. No pneumothorax. Paucity of bowel gas in the visible abdomen. Stable visualized osseous structures. IMPRESSION: 1. Ongoing moderate bilateral pleural effusions and pulmonary vascular congestion or mild edema, not significantly changed since November. 2. No new cardiopulmonary abnormality. Electronically Signed   By: Genevie Ann M.D.   On: 10/20/2022 10:31      Assessment/Plan Principal Problem:   Acute on chronic respiratory failure with hypoxia (HCC) Active Problems:   Bilateral pleural effusion   Influenza A   COPD with acute exacerbation (HCC)   Sepsis (HCC)   Marginal zone lymphoma (HCC)   Hypothyroidism   Neutropenia with fever (HCC)   Myocardial injury   Type II diabetes mellitus with renal manifestations (HCC)   Thrombocytopenia (HCC)   Iron deficiency anemia   Chronic diastolic CHF (congestive heart failure) (HCC)   Assessment and Plan:  Acute on chronic respiratory failure with hypoxia and sepsis due to Flu A infection and Flu A-induced COPD exacerbation: Patient meets  criteria for sepsis with heart rate of 109, RR 22, fever of 101.  Pending lactic acid level  - will admit to SDU as inpatient - BiPAP -Bronchodilators -Tamiflu -Solu-Medrol 40 mg IV bid -Mucinex for cough  -Incentive spirometry -sputum culture -Nasal cannula oxygen as needed to maintain O2 saturation 93% or greater while patient is off BiPAP -Check procalcitonin level -Trend lactic acid level -Will not give  IV fluid unless lactic acid is significantly elevated since patient already has elevated BNP 348  Bilateral pleural effusion: This is chronic issue.  May be related to CHF and lymphoma -Patient cannot tolerate solid disease now -If does not improve with treatment of CHF, may consider thoracentesis the patient is stabilized  Marginal zone lymphoma [facility]): Patient is currently on Calquence. Consulted Dr. Janese Banks -will hold Calquence per Dr. Janese Banks  Hypothyroidism -Synthroid  Neutropenia with fever ([place]) -Started vancomycin and cefepime -Dr. Janese Banks ordered Neupogen -Follow-up blood culture -Neutropenia precaution  Myocardial injury: Troponin level 18 -Trend troponin level -Check A1c, FLP  Type II diabetes mellitus with renal manifestations [facility]): Patient is still taking 70/30 insulin 5 units twice daily -70/30 insulin 4 units twice daily -SSI  Thrombocytopenia ([place]): This is chronic issue.  Platelet 99.  No active bleeding -Follow-up repeat CBC  Iron deficiency anemia: Hemoglobin 7.5 (6.4 10/14/2022) -Follow-up with  Chronic diastolic CHF (congestive heart failure) ([place]): 2D echo 07/13/2022 showed EF of 60 to 65%.  BNP is elevated 348.  Chest x-ray showed vascular congestion. -Will continue home oral Lasix -Will not give IV Lasix due to sepsis    DVT ppx: SCD  Code Status: Full code per pt  Family Communication:   Yes, patient's    daughter     by phone  Disposition Plan:  Anticipate discharge back to previous environment  Consults called: Dr. Janese Banks Of  oncology  Admission status and Level of care: Stepdown:  as inpt       Dispo: The patient is from: Home              Anticipated d/c is to: Home              Anticipated d/c date is: 2 days              Patient currently is not medically stable to d/c.    Severity of Illness:  The appropriate patient status for this patient is INPATIENT. Inpatient status is judged to be reasonable and necessary in order to provide the required intensity of service to ensure the patient's safety. The patient's presenting symptoms, physical exam findings, and initial radiographic and laboratory data in the context of their chronic comorbidities is felt to place them at high risk for further clinical deterioration. Furthermore, it is not anticipated that the patient will be medically stable for discharge from the hospital within 2 midnights of admission.   * I certify that at the point of admission it is my clinical judgment that the patient will require inpatient hospital care spanning beyond 2 midnights from the point of admission due to high intensity of service, high risk for further deterioration and high frequency of surveillance required.*       Date of Service 10/20/2022    Ivor Costa [facility]   If 7PM-7AM, please contact night-coverage [email or web address] 10/20/2022, 4:03 PM

## 2022-10-20 NOTE — ED Provider Notes (Signed)
[facility] Provider Note    Event Date/Time   First MD Initiated Contact with Patient 10/20/22 [phone]     (approximate)   History   Shortness of Breath  EM caveat: Dyspnea  HPI  Natasha [name] is a 76 y.o. female  past medical history significant for type 2 diabetes COPD postherpetic neuralgia who has been referred for anemia and also lymphoma according to recent oncology note.  MS reports patient has been short of breath for 2 days, fever of 102 on EMS evaluation with hypoxia saturation 60%.  Placed on nonrebreather, short transport time and delivered to the ED thereafter.  Patient reports shortness of breath.  Not in any pain.     Physical Exam   Triage Vital Signs: ED Triage Vitals  Enc Vitals Group     BP 10/20/22 0936 (!) 118/59     Pulse Rate 10/20/22 0936 (!) 109     Resp 10/20/22 0936 20     Temp --      Temp src --      SpO2 10/20/22 0926 100 %     Weight --      Height --      Head Circumference --      Peak Flow --      Pain Score 10/20/22 0937 0     Pain Loc --      Pain Edu? --      Excl. in [place]? --     Most recent vital signs: Vitals:   10/20/22 1400 10/20/22 1409  BP:    Pulse: (!) 107 (!) 103  Resp: (!) 23 (!) 23  Temp:    SpO2: 100% 100%     General: Awake, appears in distress, increased work of breathing including moderate accessory muscle use.  She is fully alert follows commands.  But she appears quite dyspneic.  Currently on nonrebreather CV:  Good peripheral perfusion.  Slight tachycardia, no noted murmur. Resp:  Tachypnea, diminished lung sounds throughout, slight coarseness or mild wheezing.  Questionably some crackles noted centrally in lower lung fields as well.  No noted peripheral edema.  Moderate accessory muscle use, no tripoding and not in severe extremis, but appears to have elevated work of breathing Abd:  No distention.  Soft nontender Other:     ED Results / Procedures / Treatments   Labs (all  labs ordered are listed, but only abnormal results are displayed) Labs Reviewed  RESP PANEL BY RT-PCR (FLU A&B, COVID) ARPGX2 - Abnormal; Notable for the following components:      Result Value   Influenza A by PCR POSITIVE (*)    All other components within normal limits  COMPREHENSIVE METABOLIC PANEL - Abnormal; Notable for the following components:   Glucose, Bld 206 (*)    BUN 33 (*)    Calcium 8.6 (*)    Total Protein 5.7 (*)    Albumin 3.1 (*)    Alkaline Phosphatase 200 (*)    Total Bilirubin 1.3 (*)    All other components within normal limits  CBC WITH DIFFERENTIAL/PLATELET - Abnormal; Notable for the following components:   RBC 2.74 (*)    Hemoglobin 7.5 (*)    HCT 26.6 (*)    MCHC 28.2 (*)    RDW 18.3 (*)    Platelets 99 (*)    nRBC 2.4 (*)    Neutro Abs 0.0 (*)    All other components within normal limits  PROTIME-INR - Abnormal;  Notable for the following components:   Prothrombin Time 15.5 (*)    All other components within normal limits  APTT - Abnormal; Notable for the following components:   aPTT 23 (*)    All other components within normal limits  BRAIN NATRIURETIC PEPTIDE - Abnormal; Notable for the following components:   B Natriuretic Peptide 348.6 (*)    All other components within normal limits  BLOOD GAS, VENOUS - Abnormal; Notable for the following components:   pCO2, Ven 69 (*)    Bicarbonate 31.0 (*)    All other components within normal limits  TROPONIN I (HIGH SENSITIVITY) - Abnormal; Notable for the following components:   Troponin I (High Sensitivity) 18 (*)    All other components within normal limits  CULTURE, BLOOD (ROUTINE X 2)  CULTURE, BLOOD (ROUTINE X 2)  EXPECTORATED SPUTUM ASSESSMENT W GRAM STAIN, RFLX TO RESP C  MRSA NEXT GEN BY PCR, NASAL  LACTIC ACID, PLASMA  LIPASE, BLOOD  LACTIC ACID, PLASMA  URINALYSIS, COMPLETE (UACMP) WITH MICROSCOPIC  HEMOGLOBIN A1C  PROCALCITONIN  TROPONIN I (HIGH SENSITIVITY)  TROPONIN I (HIGH  SENSITIVITY)     EKG  Interpreted by me at 10 AM heart rate 110 QRS 80 QTc 420 Sinus tachycardia with nonspecific T wave abnormality   RADIOLOGY Chest x-ray interpreted by me has bilateral pleural effusions with vascular congestion  DG Chest Port 1 View  Result Date: 10/20/2022 CLINICAL DATA:  76 year old female with possible sepsis. EXAM: PORTABLE CHEST 1 VIEW COMPARISON:  Portable chest 09/20/2022 and earlier. FINDINGS: Portable AP semi upright view at 1019 hours. Ongoing moderate bilateral pleural effusions, not significantly changed. Right chest power port redemonstrated. Stable visible mediastinal contours. Partially calcified right lung nodule appears stable from a a chest CT 04/24/2022 (please see that report). Pulmonary vascularity appears stable since November. No pneumothorax. Paucity of bowel gas in the visible abdomen. Stable visualized osseous structures. IMPRESSION: 1. Ongoing moderate bilateral pleural effusions and pulmonary vascular congestion or mild edema, not significantly changed since November. 2. No new cardiopulmonary abnormality. Electronically Signed   By: Genevie Ann M.D.   On: 10/20/2022 10:31        PROCEDURES:  Critical Care performed: Yes, see critical care procedure note(s)  Procedures  CRITICAL CARE Performed by: Delman Kitten   Total critical care time: 30 minutes  Critical care time was exclusive of separately billable procedures and treating other patients.  Critical care was necessary to treat or prevent imminent or life-threatening deterioration.  Critical care was time spent personally by me on the following activities: development of treatment plan with patient and/or surrogate as well as nursing, discussions with consultants, evaluation of patient's response to treatment, examination of patient, obtaining history from patient or surrogate, ordering and performing treatments and interventions, ordering and review of laboratory studies, ordering  and review of radiographic studies, pulse oximetry and re-evaluation of patient's condition.   MEDICATIONS ORDERED IN ED: Medications  acetaminophen (TYLENOL) tablet 1,000 mg (1,000 mg Oral Not Given 10/20/22 1328)  ipratropium-albuterol (DUONEB) 0.5-2.5 (3) MG/3ML nebulizer solution 3 mL (3 mLs Nebulization Given 10/20/22 1408)  albuterol (PROVENTIL) (2.5 MG/3ML) 0.083% nebulizer solution 2.5 mg (has no administration in time range)  dextromethorphan-guaiFENesin (MUCINEX DM) 30-600 MG per 12 hr tablet 1 tablet (has no administration in time range)  methylPREDNISolone sodium succinate (SOLU-MEDROL) 40 mg/mL injection 40 mg (has no administration in time range)  ondansetron (ZOFRAN) injection 4 mg (has no administration in time range)  acetaminophen (TYLENOL) tablet 650  mg (has no administration in time range)  oseltamivir (TAMIFLU) capsule 75 mg (has no administration in time range)  insulin aspart (novoLOG) injection 0-5 Units (has no administration in time range)  insulin aspart (novoLOG) injection 0-9 Units (has no administration in time range)  oseltamivir (TAMIFLU) capsule 75 mg (75 mg Oral Not Given 10/20/22 1440)  vancomycin (VANCOREADY) IVPB 1250 mg/250 mL (has no administration in time range)  ceFEPIme (MAXIPIME) 2 g in sodium chloride 0.9 % 100 mL IVPB (has no administration in time range)  Tbo-Filgrastim (GRANIX) injection 480 mcg (has no administration in time range)  chlorpheniramine-HYDROcodone (TUSSIONEX) 10-8 MG/5ML suspension 5 mL (has no administration in time range)  methylPREDNISolone sodium succinate (SOLU-MEDROL) 125 mg/2 mL injection 125 mg (125 mg Intravenous Given 10/20/22 1225)  ipratropium-albuterol (DUONEB) 0.5-2.5 (3) MG/3ML nebulizer solution 3 mL (3 mLs Nebulization Given 10/20/22 1029)  ipratropium-albuterol (DUONEB) 0.5-2.5 (3) MG/3ML nebulizer solution 3 mL (3 mLs Nebulization Given 10/20/22 1029)  lactated ringers bolus 1,000 mL (0 mLs Intravenous Stopped  10/20/22 1445)  ceFEPIme (MAXIPIME) 2 g in sodium chloride 0.9 % 100 mL IVPB (0 g Intravenous Stopped 10/20/22 1255)  vancomycin (VANCOREADY) IVPB 1750 mg/350 mL (0 mg Intravenous Stopped 10/20/22 1455)     IMPRESSION / MDM / ASSESSMENT AND PLAN / ED COURSE  I reviewed the triage vital signs and the nursing notes.                              Differential diagnosis includes, but is not limited to, viral syndrome, influenza, acute infectious abnormality, pneumonia, urinary tract infection sepsis etc.  Patient has notable dyspnea and oxygen requirement in the setting of fever of 102 and 2 days of cough.  She appears acutely ill, and has been placed on BiPAP for increased work of breathing.  Also initiating treatments for underlying COPD which I suspect has been exacerbated.  No chest pain.  She does exhibit moderate bilateral lower extremity edema.  No focal leg swelling noted.  No chest pain nothing that would be highly suggestive of acute pulmonary embolism especially in setting of fever and productive cough  Await viral studies, possible COVID or influenza like picture, if these are negative would anticipate antibiotic coverage   Patient's presentation is most consistent with acute presentation with potential threat to life or bodily function.   The patient is on the cardiac monitor to evaluate for evidence of arrhythmia and/or significant heart rate changes.  Clinical Course as of 10/20/22 1636  Wed Oct 20, 2022  1037 Patient tolerating BiPAP well at this time.  Resting laying with pillow on her side, and work of breathing improving.  Lung fields are more clear.  She reports feeling improved.  Clinically she appears to be improving. [MQ]    Clinical Course User Index [MQ] Delman Kitten, MD   Admitted to the care of Dr. Blaine Hamper  FINAL CLINICAL IMPRESSION(S) / ED DIAGNOSES   Final diagnoses:  Neutropenic fever ([place])  Influenza   Rx / DC Orders   ED Discharge Orders     None         Note:  This document was prepared using Dragon voice recognition software and may include unintentional dictation errors.   Delman Kitten, MD 10/20/22 [id number]

## 2022-10-20 NOTE — Progress Notes (Signed)
PHARMACY -  BRIEF ANTIBIOTIC NOTE   Pharmacy has received consult(s) for cefepime and vancomycin from an ED provider.  The patient's profile has been reviewed for ht/wt/allergies/indication/available labs.    One time order(s) placed for cefepime 2 gm and Vancomycin 1750 mg  Further antibiotics/pharmacy consults should be ordered by admitting physician if indicated.                       Thank you, Eston Heslin A 10/20/2022  11:24 AM

## 2022-10-20 NOTE — Consult Note (Signed)
Pharmacy Antibiotic Note  Natasha [name] is a 76 y.o. female admitted on 10/20/2022 with  febrile neutropenia .  Pharmacy has been consulted for vancomycin and cefepime dosing.  Patient is influenza A positive. Received Vancomycin 1750 mg IV x 1 12/27 @ 1255 and Cefepime 2 grams IV x 1 @ 1225  Plan: Start Vancomycin 1250 mg IV every 24 hours Estimated AUC 493.7, Cmin 11.9 Start Cefepime 2 grams IV every 12 hours Vancomycin levels as clinically appropriate Follow-up cultures and MRSA screen  Weight: 74.4 kg (164 lb 0.4 oz)  Temp (24hrs), Avg:100.3 F (37.9 C), Min:99.4 F (37.4 C), Max:101.1 F (38.4 C)  Recent Labs  Lab 10/14/22 1013 10/20/22 0945  WBC 4.1 4.6  CREATININE 0.86 0.86  LATICACIDVEN  --  0.9    Estimated Creatinine Clearance: 58 mL/min (by C-G formula based on SCr of 0.86 mg/dL).    No Known Allergies  Antimicrobials this admission: cefepime 12/27 >>  vancomycin 12/27 >>   Dose adjustments this admission: N/A  Microbiology results: 12/27 BCx: pending 12/27 Sputum: pending  12/27 MRSA PCR: pending  Thank you for allowing pharmacy to be a part of this patient's care.  Lorin Picket, PharmD 10/20/2022 1:41 PM

## 2022-10-20 NOTE — Progress Notes (Signed)
       CROSS COVER NOTE  NAME: Natasha [name] MRN: [id number] DOB : January 23, 1946 ATTENDING PHYSICIAN: Ivor Costa, MD    Date of Service   10/20/2022   HPI/Events of Note   Message received from nursing reporting hypotension. BP 90/45 MAP 58.   Interventions   Assessment/Plan: 5 mg PO midodrine Albumin      To reach the provider On-Call:   7AM- 7PM see care teams to locate the attending and reach out to them via [email or web address]. 7PM-7AM contact night-coverage If you still have difficulty reaching the appropriate provider, please page the [facility] (Director on Call) for [facility] on amion for assistance  This document was prepared using Set designer software and may include unintentional dictation errors.  Neomia Glass DNP, MBA, FNP-BC Nurse Practitioner [facility] Pager [phone]

## 2022-10-21 DIAGNOSIS — R5081 Fever presenting with conditions classified elsewhere: Secondary | ICD-10-CM

## 2022-10-21 DIAGNOSIS — J9602 Acute respiratory failure with hypercapnia: Secondary | ICD-10-CM

## 2022-10-21 DIAGNOSIS — T451X5A Adverse effect of antineoplastic and immunosuppressive drugs, initial encounter: Secondary | ICD-10-CM

## 2022-10-21 DIAGNOSIS — D61818 Other pancytopenia: Secondary | ICD-10-CM | POA: Diagnosis not present

## 2022-10-21 DIAGNOSIS — D6481 Anemia due to antineoplastic chemotherapy: Secondary | ICD-10-CM | POA: Diagnosis not present

## 2022-10-21 DIAGNOSIS — D709 Neutropenia, unspecified: Secondary | ICD-10-CM

## 2022-10-21 DIAGNOSIS — C8300 Small cell B-cell lymphoma, unspecified site: Secondary | ICD-10-CM | POA: Diagnosis not present

## 2022-10-21 DIAGNOSIS — A419 Sepsis, unspecified organism: Secondary | ICD-10-CM | POA: Diagnosis not present

## 2022-10-21 DIAGNOSIS — J9601 Acute respiratory failure with hypoxia: Secondary | ICD-10-CM

## 2022-10-21 DIAGNOSIS — N179 Acute kidney failure, unspecified: Secondary | ICD-10-CM | POA: Diagnosis not present

## 2022-10-21 DIAGNOSIS — E663 Overweight: Secondary | ICD-10-CM | POA: Diagnosis not present

## 2022-10-21 DIAGNOSIS — R6520 Severe sepsis without septic shock: Secondary | ICD-10-CM

## 2022-10-21 LAB — URINALYSIS, COMPLETE (UACMP) WITH MICROSCOPIC
Bilirubin Urine: NEGATIVE
Glucose, UA: NEGATIVE mg/dL
Ketones, ur: NEGATIVE mg/dL
Leukocytes,Ua: NEGATIVE
Nitrite: NEGATIVE
Protein, ur: 100 mg/dL — AB
Specific Gravity, Urine: 1.017 (ref 1.005–1.030)
pH: 6 (ref 5.0–8.0)

## 2022-10-21 LAB — CBC
HCT: 22 % — ABNORMAL LOW (ref 36.0–46.0)
Hemoglobin: 6.1 g/dL — ABNORMAL LOW (ref 12.0–15.0)
MCH: 27.1 pg (ref 26.0–34.0)
MCHC: 27.7 g/dL — ABNORMAL LOW (ref 30.0–36.0)
MCV: 97.8 fL (ref 80.0–100.0)
Platelets: 81 10*3/uL — ABNORMAL LOW (ref 150–400)
RBC: 2.25 MIL/uL — ABNORMAL LOW (ref 3.87–5.11)
RDW: 17.9 % — ABNORMAL HIGH (ref 11.5–15.5)
WBC: 3.9 10*3/uL — ABNORMAL LOW (ref 4.0–10.5)
nRBC: 1.5 % — ABNORMAL HIGH (ref 0.0–0.2)

## 2022-10-21 LAB — BASIC METABOLIC PANEL
Anion gap: 8 (ref 5–15)
BUN: 51 mg/dL — ABNORMAL HIGH (ref 8–23)
CO2: 25 mmol/L (ref 22–32)
Calcium: 8.2 mg/dL — ABNORMAL LOW (ref 8.9–10.3)
Chloride: 106 mmol/L (ref 98–111)
Creatinine, Ser: 1.18 mg/dL — ABNORMAL HIGH (ref 0.44–1.00)
GFR, Estimated: 48 mL/min — ABNORMAL LOW (ref >60)
Glucose, Bld: 219 mg/dL — ABNORMAL HIGH (ref 70–99)
Potassium: 4.7 mmol/L (ref 3.5–5.1)
Sodium: 139 mmol/L (ref 135–145)

## 2022-10-21 LAB — EXPECTORATED SPUTUM ASSESSMENT W GRAM STAIN, RFLX TO RESP C

## 2022-10-21 LAB — LIPID PANEL
Cholesterol: 83 mg/dL (ref 0–200)
HDL: 12 mg/dL — ABNORMAL LOW (ref >40)
LDL Cholesterol: 56 mg/dL (ref 0–99)
Total CHOL/HDL Ratio: 6.9 RATIO
Triglycerides: 75 mg/dL (ref <150)
VLDL: 15 mg/dL (ref 0–40)

## 2022-10-21 LAB — PREPARE RBC (CROSSMATCH)

## 2022-10-21 LAB — TROPONIN I (HIGH SENSITIVITY): Troponin I (High Sensitivity): 14 ng/L (ref <18)

## 2022-10-21 LAB — CBG MONITORING, ED
Glucose-Capillary: 188 mg/dL — ABNORMAL HIGH (ref 70–99)
Glucose-Capillary: 262 mg/dL — ABNORMAL HIGH (ref 70–99)
Glucose-Capillary: 263 mg/dL — ABNORMAL HIGH (ref 70–99)
Glucose-Capillary: 259 mg/dL — ABNORMAL HIGH (ref 70–99)

## 2022-10-21 LAB — MRSA NEXT GEN BY PCR, NASAL: MRSA by PCR Next Gen: NOT DETECTED

## 2022-10-21 LAB — PROCALCITONIN: Procalcitonin: 4.06 ng/mL

## 2022-10-21 MED ORDER — VANCOMYCIN HCL IN DEXTROSE 1-5 GM/200ML-% IV SOLN
1000.0000 mg | INTRAVENOUS | Status: DC
  Administered 2022-10-21: 1000 mg via INTRAVENOUS
  Filled 2022-10-21: qty 200

## 2022-10-21 MED ORDER — FUROSEMIDE 10 MG/ML IJ SOLN
20.0000 mg | Freq: Once | INTRAMUSCULAR | Status: AC
  Administered 2022-10-21: 20 mg via INTRAVENOUS
  Filled 2022-10-21: qty 4

## 2022-10-21 MED ORDER — ALBUMIN HUMAN 25 % IV SOLN
12.5000 g | Freq: Once | INTRAVENOUS | Status: AC
  Administered 2022-10-21: 12.5 g via INTRAVENOUS
  Filled 2022-10-21: qty 50

## 2022-10-21 MED ORDER — FUROSEMIDE 10 MG/ML IJ SOLN: 20.0000 mg | Freq: Once | INTRAMUSCULAR | Status: DC

## 2022-10-21 MED ORDER — MIDODRINE HCL 5 MG PO TABS
10.0000 mg | ORAL_TABLET | Freq: Three times a day (TID) | ORAL | Status: DC
  Administered 2022-10-21 – 2022-10-28 (×17): 10 mg via ORAL
  Filled 2022-10-21 (×17): qty 2

## 2022-10-21 MED ORDER — SODIUM CHLORIDE 0.9% IV SOLUTION
Freq: Once | INTRAVENOUS | Status: DC
  Filled 2022-10-21: qty 250

## 2022-10-21 MED ORDER — OSELTAMIVIR PHOSPHATE 30 MG PO CAPS
30.0000 mg | ORAL_CAPSULE | Freq: Two times a day (BID) | ORAL | Status: DC
  Administered 2022-10-22 – 2022-10-25 (×9): 30 mg via ORAL
  Filled 2022-10-21 (×10): qty 1

## 2022-10-21 NOTE — Assessment & Plan Note (Signed)
Patient normally on 4 L initially requiring BiPAP, currently down to 5 L

## 2022-10-21 NOTE — Assessment & Plan Note (Signed)
For now, just on home dose of p.o. Lasix.  Blood pressure continues to improve, will change her to IV Lasix

## 2022-10-21 NOTE — Assessment & Plan Note (Signed)
Oncology following. ?

## 2022-10-21 NOTE — ED Notes (Signed)
This RN to bedside to medicate and assess pt. Pt. Sat up in bed to eat. Medicated without complication. Pt. Lunch set up, pt denies any further need. Eating lunch, sitting up in bed, call light in reach. NAD.

## 2022-10-21 NOTE — Assessment & Plan Note (Signed)
Continue Synthroid °

## 2022-10-21 NOTE — Assessment & Plan Note (Signed)
No evidence of active bleeding.  Hypotension is more due to severe sepsis.  Hemoglobin at 7.5 on day of admission, down to 6.1 the following day.  Will transfuse 2 units packed red blood cells.  Blood will help her anemia.

## 2022-10-21 NOTE — Assessment & Plan Note (Signed)
Sliding scale:

## 2022-10-21 NOTE — ED Notes (Signed)
Dr. Krishnan at bedside. 

## 2022-10-21 NOTE — Consult Note (Signed)
Pharmacy Antibiotic Note  Natasha [name] is a 76 y.o. female admitted on 10/20/2022 with  febrile neutropenia .  Pharmacy has been consulted for vancomycin and cefepime dosing.  Patient is influenza A positive. Received Vancomycin 1750 mg IV x 1 12/27 @ 1255 and Cefepime 2 grams IV x 1 @ 1225  Scr increased to 1.18 from 0.86.  Plan: Day 2 of antibiotics Change Vancomycin to 1000 mg IV every 24 hours Estimated AUC 523.8, Cmin 14.3 Continue Cefepime 2 grams IV every 12 hours Vancomycin levels as clinically appropriate Follow-up cultures and MRSA screen  Weight: 74.4 kg (164 lb 0.4 oz)  Temp (24hrs), Avg:99 F (37.2 C), Min:98.5 F (36.9 C), Max:99.5 F (37.5 C)  Recent Labs  Lab 10/20/22 0945 10/21/22 0559  WBC 4.6 3.9*  CREATININE 0.86 1.18*  LATICACIDVEN 0.9  --      Estimated Creatinine Clearance: 42.3 mL/min (A) (by C-G formula based on SCr of 1.18 mg/dL (H)).    No Known Allergies  Antimicrobials this admission: cefepime 12/27 >>  vancomycin 12/27 >>   Dose adjustments this admission: N/A  Microbiology results: 12/27 BCx: ngtd 12/27 Sputum: pending  12/27 MRSA PCR: pending  Thank you for allowing pharmacy to be a part of this patient's care.  Lorin Picket, PharmD 10/21/2022 11:10 AM

## 2022-10-21 NOTE — ED Notes (Signed)
Respiratory called to give duoneb

## 2022-10-21 NOTE — Progress Notes (Addendum)
[facility] Progress Note  Patient: Natasha [name]    WCH:[id number]  DOA: 10/20/2022    Date of Service: the patient was seen and examined on 10/21/2022  Brief hospital course: Patient is a 76 year old female with past medical history of B-cell lymphoma, COPD with chronic respiratory failure on 3 L nasal cannula, diastolic CHF and diabetes mellitus who presented to the emergency room on 12/27 with complaints of shortness of breath, fever and cough times several days.  In the emergency room, patient found to be in significant respiratory distress with hypoxia requiring BiPAP and workup revealed patient positive for flu as well as a mild heart failure.  Patient was admitted to the hospitalist service and placed in stepdown with oncology consulted.  Patient started on Tamiflu and IV antibiotics plus continued on BiPAP.  By morning of 12/28, hemoglobin down to 6.1 and patient ordered 2 units packed red blood cells.   Assessment and Plan: * Severe sepsis ([place]) Meets criteria for severe sepsis on admission secondary to tachycardia, tachypnea, temperature greater than 100.5 from influenza as well as bronchitis/pneumonia and hypotension.  Have stopped fluid resuscitation due to CHF.  Sepsis itself stabilized but infection persistent.  Have added scheduled 3 times daily midodrine to help with blood pressure.  COPD with acute exacerbation (HCC) Continue supplemental oxygen plus nebulizers and steroids.  Noted elevated procalcitonin so antibiotics.  Recheck procalcitonin in the morning.  Continue to trend down oxygen and taper down steroids.  Acute on chronic respiratory failure with hypoxia and hypercarbia (HCC) Patient normally on 4 L initially requiring BiPAP, currently down to 5 L  Influenza A On Tamiflu  Acute on chronic diastolic CHF (congestive heart failure) ([name]) For now, just on home dose of p.o. Lasix.  Blood pressure continues to improve, will change her to IV Lasix  Neutropenia with  fever (HCC)/pancytopenia Oncology following  AKI (acute kidney injury) ([place]) Secondary to hypotension.  Creatinine at 0.86 on admission, up to 1.18 x 12/28  Type II diabetes mellitus with renal manifestations (HCC) Sliding scale  Hypothyroidism Continue Synthroid  Overweight (BMI 25.0-29.9) Meets criteria for BMI greater than 25  Anemia associated with chemotherapy No evidence of active bleeding.  Hypotension is more due to severe sepsis.  Hemoglobin at 7.5 on day of admission, down to 6.1 the following day.  Will transfuse 2 units packed red blood cells.  Blood will help her anemia.       Body mass index is 27.29 kg/m.        Consultants: None  Procedures: Transfusion 2 units packed red blood cells 12/28  Antimicrobials: IV cefepime 12/27-present Tamiflu  Code Status: Full code   Subjective: Complains of some shortness of breath and cough although improved from admission  Objective: Noted soft blood pressures Vitals:   10/21/22 1715 10/21/22 1730  BP:  (!) 92/55  Pulse: 80 83  Resp: 14 16  Temp:    SpO2: 97% 96%    Intake/Output Summary (Last 24 hours) at 10/21/2022 1759 Last data filed at 10/21/2022 1418 Gross per 24 hour  Intake 407.82 ml  Output --  Net 407.82 ml   Filed Weights   10/20/22 1100  Weight: 74.4 kg   Body mass index is 27.29 kg/m.  Exam:  General: Alert and oriented x 3, fatigued HEENT: Normocephalic, atraumatic, mucous membranes are slightly dry Cardiovascular: Regular rate and rhythm, S1-S2 Respiratory: Bilateral expiratory wheeze, bilateral rhonchi Abdomen: Soft, nontender, nondistended, hypoactive bowel sounds Musculoskeletal: No clubbing or cyanosis, 1-2+ pitting  edema from the knees down bilaterally Skin: Chronic venous stasis Psychiatry: Appropriate, no evidence of psychoses Neurology: No focal deficits  Data Reviewed: Creatinine of 1.18 , BNP of 349, procalcitonin of 4.06 White blood cell count of 3.9 with  hemoglobin of 6.1  Disposition:  Status is: Inpatient Remains inpatient appropriate because:  -Stabilization of infection -Improvement in blood pressure -Improvement in hypoxia    Anticipated discharge date: 1/1 \  Family Communication: Will call daughter DVT Prophylaxis: SCDs Start: 10/20/22 1333  35 minutes spent in the care of this critically ill patient including medical decision making, bedside examination, interpretation of labs and studies  Author: Annita Brod ,MD 10/21/2022 5:59 PM  To reach On-call, see care teams to locate the attending and reach out via [email or web address]. Between 7PM-7AM, please contact night-coverage If you still have difficulty reaching the attending provider, please page the [facility] (Director on Call) for [facility] on amion for assistance.

## 2022-10-21 NOTE — TOC Initial Note (Signed)
Transition of Care [facility]) - Initial/Assessment Note    Patient Details  Name: Natasha [name] MRN: [id number] Date of Birth: 1946/09/15  Transition of Care [facility]) CM/SW Contact:    Candie Chroman, LCSW Phone Number: 10/21/2022, 1:24 PM  Clinical Narrative:   Readmission prevention screen complete. CSW met with patient. No supports at bedside. CSW introduced role and explained that discharge planning would be discussed. PCP is [name], NP. Daughter drives her to appointments when able. [facility] [address]. Patient is active with [facility] for PT and OT. No DME use at home prior to admission other than oxygen. Typically on 3 L at home. No further concerns. CSW encouraged patient to contact CSW as needed. CSW will continue to follow patient for support and facilitate return home once stable. Daughter will likely transport home once stable.               Expected Discharge Plan: [place] Barriers to Discharge: Continued Medical Work up   Patient Goals and CMS Choice     Choice offered to / list presented to : Patient      Expected Discharge Plan and Services     Post Acute Care Choice: Resumption of Svcs/PTA Provider Living arrangements for the past 2 months: [place]: PT, OT [place] Agency: [place] Date [place]: 10/21/22   Representative spoke with at [place]: Adela Lank  Prior Living Arrangements/Services Living arrangements for the past 2 months: [place]   Patient language and need for interpreter reviewed:: Yes Do you feel safe going back to the place where you live?: Yes      Need for Family Participation in Patient Care: Yes (Comment)   Current home services: DME, Home OT, Home PT Criminal Activity/Legal Involvement Pertinent to Current Situation/Hospitalization: No - Comment as needed  Activities of Daily Living       Permission Sought/Granted Permission sought to share information with : Facility Art therapist granted to share information with : Yes, Verbal Permission Granted     Permission granted to share info w AGENCY: [facility]        Emotional Assessment Appearance:: Appears stated age Attitude/Demeanor/Rapport: Engaged, Gracious Affect (typically observed): Accepting, Appropriate, Calm, Pleasant Orientation: : Oriented to Self, Oriented to Place, Oriented to  Time, Oriented to Situation Alcohol / Substance Use: Not Applicable Psych Involvement: No (comment)  Admission diagnosis:  Influenza A [J10.1] Patient Active Problem List   Diagnosis Date Noted   Overweight (BMI 25.0-29.9) 10/21/2022   AKI (acute kidney injury) ([place]) 10/21/2022   Influenza A 10/20/2022   Neutropenia with fever (HCC)/pancytopenia 10/20/2022   Chronic diastolic CHF (congestive heart failure) ([place]) 10/20/2022   Myocardial injury 10/20/2022   Type II diabetes mellitus with renal manifestations ([place]) 10/20/2022   Iron deficiency anemia 09/19/2022   COPD with acute exacerbation ([place]) 08/29/2022   Diabetes mellitus without complication ([place]) 17/51/0258   Normocytic anemia 08/29/2022   Sepsis ([place]) 08/29/2022   Chronic respiratory failure with hypoxia ([place]) 08/16/2022   Acute on chronic anemia 08/02/2022   Acute on chronic congestive heart failure ([place]) 08/02/2022   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   Swelling of lower extremity 07/24/2022   Hypotension 07/24/2022   Palliative care encounter  COVID-19 virus infection 07/14/2022   Acute on chronic diastolic CHF (congestive heart failure) ([place]) 07/14/2022   Anemia of chronic disease 07/14/2022   Thrombocytopenia ([place]) 07/14/2022   Hypothyroidism 07/14/2022   Chronic kidney disease, stage 3a ([place]) 07/14/2022   Bilateral pleural effusion 07/12/2022   Diabetes mellitus, type II ([place]) 07/12/2022   Acute on chronic respiratory  failure with hypoxia and hypercarbia ([place]) 07/12/2022   Recurrent pleural effusion 05/12/2022   Lower extremity edema 05/12/2022   Symptomatic anemia 04/16/2022   Type 2 diabetes mellitus with proteinuria ([place]) 04/09/2022   Atherosclerosis of aorta ([place]) 04/09/2022   Chronic obstructive pulmonary disease (COPD) ([place]) 04/09/2022   SOB (shortness of breath) on exertion 04/09/2022   Acute cough 04/09/2022   Cigarette nicotine dependence, uncomplicated 71/69/6789   Post herpetic neuralgia 03/14/2020   Gall stones 10/23/2019   Lymphoma ([place]) 10/23/2019   Generalized lymphadenopathy 09/08/2018   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   Primary osteoarthritis of both knees 07/07/2018   Senile purpura ([place]) 07/07/2018   PCP:  [name], NP [id number] [place]Phone: [phone]: [phone]    Social Determinants of Health (SDOH) Social History: SDOH Screenings   Food Insecurity: No Food Insecurity (09/18/2022)  Recent Concern: Food Insecurity - Food Insecurity Present (08/30/2022)  Housing: Low Risk  (09/18/2022)  Transportation Needs: No Transportation Needs (09/18/2022)  Recent Concern: Transportation Needs - Unmet Transportation Needs (08/30/2022)  Utilities: At Risk (10/06/2022)  Alcohol Screen: Low Risk  (08/24/2022)  Depression (PHQ2-9): Low Risk  (08/24/2022)  Financial Resource Strain: Medium Risk (08/24/2022)  Physical Activity: Inactive (08/24/2022)  Social Connections: Socially Isolated (08/24/2022)  Stress: Stress Concern Present (08/24/2022)  Tobacco Use: Medium Risk (10/20/2022)   SDOH Interventions:     Readmission Risk Interventions    10/21/2022    1:13 PM 08/17/2022   11:05 AM 08/04/2022    2:54 PM  Readmission Risk Prevention Plan  Transportation Screening Complete Complete Complete  PCP or Specialist Appt within 5-7 Days   Complete  PCP or  Specialist Appt within 3-5 Days  Complete   Home Care Screening   Complete  Medication Review (RN CM)   Complete  HRI or Home Care Consult  Complete   Social Work Consult for [place]/Counseling  Complete   Palliative Care Screening  Not Applicable   Medication Review (Press photographer Complete Complete   PCP or Specialist appointment within 3-5 days of discharge Complete    HRI or H[place]Complete    SW Recovery Care/Counseling Consult Complete    P[place]le[place]Not Applicable

## 2022-10-21 NOTE — Assessment & Plan Note (Signed)
On Tamiflu

## 2022-10-21 NOTE — ED Notes (Signed)
Pt placed on 3L [place] by respiratory.

## 2022-10-21 NOTE — Assessment & Plan Note (Signed)
Secondary to hypotension.  Creatinine at 0.86 on admission, up to 1.18 x 12/28

## 2022-10-21 NOTE — Assessment & Plan Note (Addendum)
Meets criteria for severe sepsis on admission secondary to tachycardia, tachypnea, temperature greater than 100.5 from influenza as well as bronchitis/pneumonia and hypotension.  Have stopped fluid resuscitation due to CHF.  Sepsis itself stabilized but infection persistent.  Have added scheduled 3 times daily midodrine to help with blood pressure.

## 2022-10-21 NOTE — Assessment & Plan Note (Signed)
Continue supplemental oxygen plus nebulizers and steroids.  Noted elevated procalcitonin so antibiotics.  Recheck procalcitonin in the morning.  Continue to trend down oxygen and taper down steroids.

## 2022-10-21 NOTE — Hospital Course (Addendum)
Patient is a 76 year old female with past medical history of B-cell lymphoma, COPD with chronic respiratory failure on 3 L nasal cannula, diastolic CHF and diabetes mellitus who presented to the emergency room on 12/27 with complaints of shortness of breath, fever and cough times several days.  In the emergency room, patient found to be in significant respiratory distress with hypoxia requiring BiPAP and workup revealed patient positive for flu as well as a mild heart failure.  Patient was admitted to the hospitalist service and placed in stepdown with oncology consulted.  Patient started on Tamiflu and IV antibiotics plus continued on BiPAP.  By morning of 12/28, hemoglobin down to 6.1 and patient ordered 2 units packed red blood cells.

## 2022-10-21 NOTE — Assessment & Plan Note (Signed)
Meets criteria for BMI greater than 25 

## 2022-10-21 NOTE — ED Notes (Signed)
This RN to bedside to medicate and assess pt. Pt. Repositioned for comfort. Purewick in place and functioning. Pt. Ate 80% breakfast. Pt. Provided warm blankets and pillows as requested. Denies any further need. Lights dimmed and bed lowered per pt. Request. Call light in reach. Personal belongings in reach. NAD.

## 2022-10-22 ENCOUNTER — Other Ambulatory Visit: Payer: Self-pay

## 2022-10-22 ENCOUNTER — Ambulatory Visit: Payer: Self-pay | Admitting: *Deleted

## 2022-10-22 DIAGNOSIS — A419 Sepsis, unspecified organism: Secondary | ICD-10-CM | POA: Diagnosis not present

## 2022-10-22 DIAGNOSIS — R6520 Severe sepsis without septic shock: Secondary | ICD-10-CM | POA: Diagnosis not present

## 2022-10-22 LAB — GLUCOSE, CAPILLARY
Glucose-Capillary: 238 mg/dL — ABNORMAL HIGH (ref 70–99)
Glucose-Capillary: 280 mg/dL — ABNORMAL HIGH (ref 70–99)
Glucose-Capillary: 298 mg/dL — ABNORMAL HIGH (ref 70–99)

## 2022-10-22 LAB — CBG MONITORING, ED
Glucose-Capillary: 234 mg/dL — ABNORMAL HIGH (ref 70–99)
Glucose-Capillary: 219 mg/dL — ABNORMAL HIGH (ref 70–99)

## 2022-10-22 LAB — CREATININE, SERUM
Creatinine, Ser: 1.58 mg/dL — ABNORMAL HIGH (ref 0.44–1.00)
GFR, Estimated: 34 mL/min — ABNORMAL LOW (ref >60)

## 2022-10-22 LAB — HEMOGLOBIN: Hemoglobin: 8.7 g/dL — ABNORMAL LOW (ref 12.0–15.0)

## 2022-10-22 MED ORDER — SODIUM CHLORIDE 0.9 % IV SOLN
2.0000 g | INTRAVENOUS | Status: DC
  Administered 2022-10-23 – 2022-10-24 (×2): 2 g via INTRAVENOUS
  Filled 2022-10-22 (×2): qty 12.5
  Filled 2022-10-22: qty 2

## 2022-10-22 MED ORDER — IPRATROPIUM-ALBUTEROL 0.5-2.5 (3) MG/3ML IN SOLN
3.0000 mL | Freq: Four times a day (QID) | RESPIRATORY_TRACT | Status: DC
  Administered 2022-10-22 – 2022-10-26 (×16): 3 mL via RESPIRATORY_TRACT
  Filled 2022-10-22 (×16): qty 3

## 2022-10-22 MED ORDER — CHLORHEXIDINE GLUCONATE CLOTH 2 % EX PADS
6.0000 | MEDICATED_PAD | Freq: Every day | CUTANEOUS | Status: DC
  Administered 2022-10-22 – 2022-10-28 (×7): 6 via TOPICAL

## 2022-10-22 NOTE — Consult Note (Signed)
Hematology/Oncology Consult note [facility] [phone] [phone]  Patient Care Team: [name], NP as PCP - General (Nurse Practitioner) [name], RN (Inactive) as Case Manager [name], RN as [place] Management   Name of the patient: Natasha [name]  [id number]  [birth year]    Reason for consult: neutropenic fever   Requesting physician: Dr. 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  She is currently admitted to the hospital with acute hypoxic respiratory failure and influenza pneumonia.  At baseline she is on 2 L of oxygen.  ECOG PS- 3  Pain scale- 0   Review of systems- Review of Systems  Constitutional:  Positive for malaise/fatigue. Negative for chills, fever and weight loss.  HENT:  Negative for congestion, ear discharge and nosebleeds.   Eyes:  Negative for blurred vision.  Respiratory:  Positive for shortness of breath. Negative for cough, hemoptysis, sputum production and wheezing.   Cardiovascular:  Positive for leg swelling. Negative for chest pain, palpitations, orthopnea and claudication.  Gastrointestinal:  Negative for abdominal pain, blood in stool, constipation, diarrhea, heartburn, melena, nausea and vomiting.  Genitourinary:  Negative for dysuria, flank pain, frequency, hematuria and urgency.  Musculoskeletal:  Negative for back pain, joint pain and myalgias.  Skin:  Negative for rash.  Neurological:  Negative for dizziness, tingling, focal weakness, seizures, weakness and headaches.  Endo/Heme/Allergies:  Does not bruise/bleed easily.  Psychiatric/Behavioral:  Negative for depression and suicidal  ideas. The patient does not have insomnia.     No Known Allergies  Patient Active Problem List   Diagnosis Date Noted   Overweight (BMI 25.0-29.9) 10/21/2022   AKI (acute kidney injury) ([place]) 10/21/2022   Anemia associated with chemotherapy 10/21/2022   Influenza A 10/20/2022   Neutropenia with fever (HCC)/pancytopenia 10/20/2022   Chronic diastolic CHF (congestive heart failure) ([place]) 10/20/2022   Myocardial injury 10/20/2022   Type II diabetes mellitus with renal manifestations ([place]) 10/20/2022   Iron deficiency anemia 09/19/2022   COPD with acute exacerbation ([place]) 08/29/2022   Diabetes mellitus without complication ([place]) 26/41/5830   Normocytic anemia 08/29/2022   Severe sepsis ([place]) 08/29/2022   Chronic respiratory failure with hypoxia ([place]) 08/16/2022   Acute on chronic anemia 08/02/2022   Acute on chronic congestive heart failure ([place]) 08/02/2022   Marginal zone lymphoma ([place]) 07/28/2022   Swelling of lower extremity 07/24/2022   Hypotension 07/24/2022   Palliative care encounter    COVID-19 virus infection 07/14/2022   Acute on chronic diastolic CHF (congestive heart failure) ([place]) 07/14/2022   Anemia of chronic disease 07/14/2022   Thrombocytopenia ([place]) 07/14/2022   Hypothyroidism 07/14/2022   Chronic kidney disease, stage 3a ([place]) 07/14/2022   Bilateral pleural effusion 07/12/2022   Diabetes mellitus, type II ([place]) 07/12/2022   Acute on chronic respiratory failure with hypoxia and hypercarbia ([place]) 07/12/2022   Recurrent pleural effusion 05/12/2022   Lower extremity edema 05/12/2022   Symptomatic anemia 04/16/2022   Type 2 diabetes mellitus with proteinuria ([place]) 04/09/2022   Atherosclerosis of aorta ([place]) 04/09/2022   Chronic obstructive pulmonary disease (COPD) ([place]) 04/09/2022   SOB (shortness of breath) on exertion 04/09/2022   Acute cough 04/09/2022   Cigarette nicotine dependence, uncomplicated 94/04/6807   Post herpetic neuralgia 03/14/2020  Gall  stones 10/23/2019   Lymphoma ([place]) 10/23/2019   Generalized lymphadenopathy 09/08/2018   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[place]) 09/08/2018   Primary osteoarthritis of both knees 07/07/2018   Senile purpura ([place]) 07/07/2018     Past Medical History:  Diagnosis Date   Anemia    Cancer ([place])    lymphoma-stomach    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)    COPD (chronic obstructive pulmonary disease) (HCC)    Diabetes mellitus without complication (HCC)    Hypotension    Pleural effusion    ARMc 848m,  2 weeks ago   Vaginal delivery    x 5     Past Surgical History:  Procedure Laterality Date   IR IMAGING GUIDED PORT INSERTION  08/16/2022   TONSILLECTOMY Bilateral    as a child    Social History   Socioeconomic History   Marital status: Widowed    Spouse name: Not on file   Number of children: 2   Years of education: Not on file   Highest education level: 9th grade  Occupational History   Occupation: unemployed  Tobacco Use   Smoking status: Former    Packs/day: 2.00    Years: 55.00    Total pack years: 110.00    Types: Cigarettes    Start date: 07/07/1961    Quit date: 08/02/2022    Years since quitting: 0.2   Smokeless tobacco: Never   Tobacco comments:    1/2 pack she states but family says 2 PPD  Vaping Use   Vaping Use: Never used  Substance and Sexual Activity   Alcohol use: No   Drug use: Never   Sexual activity: Not Currently    Partners: Male    Birth control/protection: Post-menopausal  Other Topics Concern   Not on file  Social History Narrative   08/14/20   [place]  Living: to be near daughter   Work: retired      FPhysiological scientistchildren - JIT consultant(nearby) and son JEvelena Peat[place]  8 grandchildren, & 2 great-grandchildren.      Enjoys: stays at home      Exercise: walking to her daughter's store   Diet: eats fruit, air fried chicken      Safety   Seat belts: Yes    Guns: No   Safe in relationships: Yes     Social Determinants of Health   Financial Resource Strain: Medium Risk (08/24/2022)   Overall Financial Resource Strain (CARDIA)    Difficulty of Paying Living Expenses: Somewhat hard  Food Insecurity: No Food Insecurity (09/18/2022)   Hunger Vital Sign    Worried About Running Out of Food in the Last Year: Never true    Ran Out of Food in the Last Year: Never true  Recent Concern: [place] (08/30/2022)   Hunger Vital Sign    Worried About Running Out of Food in the Last Year: Sometimes true    Ran Out of Food in the Last Year: Sometimes true  Transportation Needs: No Transportation Needs (09/18/2022)   PRAPARE - THydrologist(Medical): No    Lack of Transportation (Non-Medical): No  Recent Concern: Transportation Needs - Unmet Transportation Needs (08/30/2022)   PRAPARE - THydrologist(Medical): Yes    Lack of Transportation (Non-Medical): No  Physical Activity: Inactive (08/24/2022)   Exercise Vital Sign    Days  of Exercise per Week: 0 days    Minutes of Exercise per Session: 0 min  Stress: Stress Concern Present (08/24/2022)   [place]    Feeling of Stress : To some extent  Social Connections: Socially Isolated (08/24/2022)   Social Connection and Isolation Panel [NHANES]    Frequency of Communication with Friends and Family: More than three times a week    Frequency of Social Gatherings with Friends and Family: More than three times a week    Attends Religious Services: Never    Marine scientist or Organizations: No    Attends Archivist Meetings: Never    Marital Status: Widowed  Intimate Partner Violence: Not At Risk (09/18/2022)   Humiliation, Afraid, Rape, and Kick questionnaire    Fear of Current or Ex-Partner: No    Emotionally Abused: No    Physically Abused: No    Sexually Abused: No      Family History  Problem Relation Age of Onset   Breast cancer Mother    Alzheimer's disease Father    Lung cancer Father        lung   Varicose Veins Brother    Heart attack Brother      Current Facility-Administered Medications:    0.9 %  sodium chloride infusion (Manually program via Guardrails IV Fluids), , Intravenous, Once, Annita Brod, MD   acetaminophen (TYLENOL) tablet 650 mg, 650 mg, Oral, Q6H PRN, Ivor Costa, MD   acyclovir (ZOVIRAX) 200 MG capsule 400 mg, 400 mg, Oral, Daily, Ivor Costa, MD, 400 mg at 10/21/22 0920   albuterol (PROVENTIL) (2.5 MG/3ML) 0.083% nebulizer solution 2.5 mg, 2.5 mg, Nebulization, Q4H PRN, Ivor Costa, MD   ceFEPIme (MAXIPIME) 2 g in sodium chloride 0.9 % 100 mL IVPB, 2 g, Intravenous, Q12H, Marguerita, Stapp, RPH, Stopped at 10/22/22 0301   chlorpheniramine-HYDROcodone (TUSSIONEX) 10-8 MG/5ML suspension 5 mL, 5 mL, Oral, Q12H, Ivor Costa, MD, 5 mL at 10/21/22 2113   clobetasol cream (TEMOVATE) 4.62 % 1 Application, 1 Application, Topical, Daily, Ivor Costa, MD   dextromethorphan-guaiFENesin ([place] DM) 30-600 MG per 12 hr tablet 1 tablet, 1 tablet, Oral, BID PRN, Ivor Costa, MD   furosemide (LASIX) tablet 20 mg, 20 mg, Oral, Daily, Ivor Costa, MD   insulin aspart (novoLOG) injection 0-5 Units, 0-5 Units, Subcutaneous, QHS, Ivor Costa, MD, 3 Units at 10/21/22 2113   insulin aspart (novoLOG) injection 0-9 Units, 0-9 Units, Subcutaneous, TID WC, Ivor Costa, MD, 5 Units at 10/21/22 1618   insulin aspart protamine- aspart (NOVOLOG MIX 70/30) injection 4 Units, 4 Units, Subcutaneous, BID WC, Ivor Costa, MD, 4 Units at 10/21/22 1622   ipratropium-albuterol (DUONEB) 0.5-2.5 (3) MG/3ML nebulizer solution 3 mL, 3 mL, Nebulization, Q4H, Ivor Costa, MD, 3 mL at 10/22/22 0406   levothyroxine (SYNTHROID) tablet 50 mcg, 50 mcg, Oral, Q0600, Ivor Costa, MD, 50 mcg at 10/22/22 0716   loperamide (IMODIUM) capsule 2 mg, 2 mg, Oral, PRN, Ivor Costa, MD    methylPREDNISolone sodium succinate (SOLU-MEDROL) 40 mg/mL injection 40 mg, 40 mg, Intravenous, Q12H, Ivor Costa, MD, 40 mg at 10/21/22 2052   midodrine (PROAMATINE) tablet 10 mg, 10 mg, Oral, TID WC, Annita Brod, MD, 10 mg at 10/21/22 1814   ondansetron (ZOFRAN) injection 4 mg, 4 mg, Intravenous, Q8H PRN, Ivor Costa, MD   oseltamivir (TAMIFLU) capsule 30 mg, 30 mg, Oral, BID, Dolly, Harbach, RPH, 30 mg at 10/22/22 [phone]  oseltamivir (TAMIFLU) capsule 75 mg, 75 mg, Oral, Once, Noralee Space, RPH   simethicone (MYLICON) chewable tablet 80 mg, 80 mg, Oral, Q6H PRN, Ivor Costa, MD   [facility]) injection 480 mcg, 480 mcg, Subcutaneous, Daily, Sindy Guadeloupe, MD, 480 mcg at 10/20/22 1840   vancomycin (VANCOCIN) IVPB 1000 mg/200 mL premix, 1,000 mg, Intravenous, Q24H, [place], [place], [facility], Stopped at 10/21/22 1418  Current Outpatient Medications:    acalabrutinib maleate (CALQUENCE) 100 MG tablet, Take 1 tablet (100 mg total) by mouth 2 (two) times daily., Disp: 60 tablet, Rfl: 1   acyclovir (ZOVIRAX) 400 MG tablet, Take 400 mg by mouth daily., Disp: , Rfl:    albuterol (PROVENTIL) (2.5 MG/3ML) 0.083% nebulizer solution, Take 2.5 mg by nebulization every 4 (four) hours as needed., Disp: , Rfl:    albuterol (VENTOLIN HFA) 108 (90 Base) MCG/ACT inhaler, TAKE 2 PUFFS BY MOUTH EVERY 6 HOURS AS NEEDED FOR WHEEZE OR SHORTNESS OF BREATH (Patient taking differently: Inhale 2 puffs into the lungs every 6 (six) hours as needed for wheezing or shortness of breath. TAKE 2 PUFFS BY MOUTH EVERY 6 HOURS AS NEEDED FOR WHEEZE OR SHORTNESS OF BREATH), Disp: 18 g, Rfl: 1   clobetasol cream (TEMOVATE) 6.57 %, Apply 1 Application topically daily., Disp: , Rfl:    furosemide (LASIX) 20 MG tablet, Take 1 tablet (20 mg total) by mouth daily., Disp: 90 tablet, Rfl: 3   ipratropium-albuterol (DUONEB) 0.5-2.5 (3) MG/3ML SOLN, Take 3 mLs by nebulization every 6 (six) hours as needed (shortness of  breath or wheezing)., Disp: , Rfl:    levothyroxine (SYNTHROID) 50 MCG tablet, Take 1 tablet (50 mcg total) by mouth daily at 6 (six) AM., Disp: 30 tablet, Rfl: 0   insulin NPH-regular Human (70-30) 100 UNIT/ML injection, Inject 5 Units into the skin 2 (two) times daily with a meal. Use only for hyperglycemia, CBG+ 300. Start with 5 units, titrate up to max 10 unit per dose if need., Disp: 10 mL, Rfl: 0   lidocaine-prilocaine (EMLA) cream, Apply to affected area once (Patient taking differently: 1 Application. Apply to mediport site 2 hours before infusion), Disp: 30 g, Rfl: 3   loperamide (IMODIUM) 2 MG capsule, Take 2 mg by mouth as needed for diarrhea or loose stools., Disp: , Rfl:    nicotine (NICODERM CQ - DOSED IN MG/24 HOURS) 21 mg/24hr patch, Place 21 mg onto the skin daily. (Patient not taking: Reported on 10/01/2022), Disp: , Rfl:    ondansetron (ZOFRAN) 8 MG tablet, Take 8 mg by mouth every 8 (eight) hours as needed for nausea, vomiting or refractory nausea / vomiting. Take 1 tablet q6H for 4 days after chemotherapy., Disp: , Rfl:    prochlorperazine (COMPAZINE) 10 MG tablet, Take 1 tablet by mouth every 6 (six) hours as needed for nausea or vomiting. Take 1 tab q6H for 4 days after chemotherapy., Disp: , Rfl:    simethicone (MYLICON) 80 MG chewable tablet, Chew 180 mg by mouth every 6 (six) hours as needed for flatulence. Take 1 capsule PO PRN gas relief., Disp: , Rfl:    Physical exam:  Vitals:   10/22/22 0352 10/22/22 0407 10/22/22 0409 10/22/22 0713  BP:  (!) 94/55  (!) 89/60  Pulse:  76  80  Resp:  13  20  Temp: 98.7 F (37.1 C) 98.6 F (37 C)  98.1 F (36.7 C)  TempSrc: Oral Oral  Oral  SpO2:  93%    Weight:  140 lb (63.5 kg)   Height:   _0  (1.626 m)    Physical Exam Constitutional:      General: She is not in acute distress. Cardiovascular:     Rate and Rhythm: Normal rate and regular rhythm.     Heart sounds: Normal heart sounds.  Pulmonary:     Effort:  Pulmonary effort is normal.     Comments: Breath sounds decreased bilaterally over bases Abdominal:     General: Bowel sounds are normal.     Palpations: Abdomen is soft.     Comments: Palpable splenomegaly  Musculoskeletal:     Right lower leg: Edema present.     Left lower leg: Edema present.  Lymphadenopathy:     Comments: Bilateral palpable axillary adenopathy  Skin:    General: Skin is warm and dry.  Neurological:     Mental Status: She is alert and oriented to person, place, and time.           Latest Ref Rng & Units 10/21/2022    5:59 AM  CMP  Glucose 70 - 99 mg/dL 219   BUN 8 - 23 mg/dL 51   Creatinine 0.44 - 1.00 mg/dL 1.18   Sodium 135 - 145 mmol/L 139   Potassium 3.5 - 5.1 mmol/L 4.7   Chloride 98 - 111 mmol/L 106   CO2 22 - 32 mmol/L 25   Calcium 8.9 - 10.3 mg/dL 8.2       Latest Ref Rng & Units 10/21/2022    5:59 AM  CBC  WBC 4.0 - 10.5 K/uL 3.9   Hemoglobin 12.0 - 15.0 g/dL 6.1   Hematocrit 36.0 - 46.0 % 22.0   Platelets 150 - 400 K/uL 81     _1 @  DG Chest Port 1 View  Result Date: 10/20/2022 CLINICAL DATA:  76 year old female with possible sepsis. EXAM: PORTABLE CHEST 1 VIEW COMPARISON:  Portable chest 09/20/2022 and earlier. FINDINGS: Portable AP semi upright view at 1019 hours. Ongoing moderate bilateral pleural effusions, not significantly changed. Right chest power port redemonstrated. Stable visible mediastinal contours. Partially calcified right lung nodule appears stable from a a chest CT 04/24/2022 (please see that report). Pulmonary vascularity appears stable since November. No pneumothorax. Paucity of bowel gas in the visible abdomen. Stable visualized osseous structures. IMPRESSION: 1. Ongoing moderate bilateral pleural effusions and pulmonary vascular congestion or mild edema, not significantly changed since November. 2. No new cardiopulmonary abnormality. Electronically Signed   By: Genevie Ann M.D.   On: 10/20/2022 10:31   CT BONE MARROW  BIOPSY & ASPIRATION  Result Date: 09/24/2022 INDICATION: Pancytopenia EXAM: CT BONE MARROW BIOPSY AND ASPIRATION MEDICATIONS: None. ANESTHESIA/SEDATION: Local analgesia FLUOROSCOPY TIME:  N/a COMPLICATIONS: None immediate. PROCEDURE: Informed written consent was obtained from the patient after a thorough discussion of the procedural risks, benefits and alternatives. All questions were addressed. Maximal Sterile Barrier Technique was utilized including caps, mask, sterile gowns, sterile gloves, sterile drape, hand hygiene and skin antiseptic. A timeout was performed prior to the initiation of the procedure. The patient was placed prone on the CT exam table. Limited CT of the pelvis was performed for planning purposes. Skin entry site was marked, and the overlying skin was prepped and draped in the standard sterile fashion. Local analgesia was obtained with 1% lidocaine. Using CT guidance, an 11 gauge needle was advanced just deep to the cortex of the right posterior ilium. Subsequently, bone marrow aspiration and core biopsy were performed. Specimens were submitted to lab/pathology  for handling. Hemostasis was achieved with manual pressure, and a clean dressing was placed. The patient tolerated the procedure well without immediate complication. IMPRESSION: Successful CT-guided bone marrow aspiration and core biopsy of the right posterior ilium. Electronically Signed   By: Albin Felling M.D.   On: 09/24/2022 12:40    Assessment and plan- Patient is a 76 y.o. female with stage IV nodal marginal zone lymphoma admitted for acute on chronic hypoxic respiratory failure from influenza pneumonia  Patient initially required BiPAP on admission but is currently on 2 L of oxygen and appears comfortable on my exam today.  She has significant pancytopenia mainly neutropenia and anemia secondary to underlying lymphoma.  She had a recent bone marrow biopsy that confirmed the same.  Acalabrutinib will be on hold while  patient is hospitalized and when she recovers from her pneumonia we will resume it as an outpatient.  Transfuse irradiated blood products if hemoglobin less than 7.  Neutropenia: Likely secondary to underlying lymphoma.  I have started her on Neupogen but I doubt that it would help.  I am only continuing it due to her ongoing neutropenic fever  Visit Diagnosis 1. Neutropenic fever ([place])     Dr. Randa Evens, MD, MPH [facility] [phone] 10/22/2022

## 2022-10-22 NOTE — Patient Instructions (Signed)
Visit Information  Thank you for taking time to visit with me today. Please don't hesitate to contact me if I can be of assistance to you.   Following are the goals we discussed today:   Goals Addressed             This Visit's Progress    In home care needs       Care Coordination Interventions: Follow up phone call to patient's daughter regarding update on heating repairs Patient currently in the ED Patient's daughter confirms that several contractors have been to the home to assess the repairs, however no work has been started yet as work in very involved Patient's daughter confirms that she has purchased a Best boy in the living room, O2 has been moved to the dining room This Education officer, museum will follow up with patient  and status of heating unit post discharge from the hospital        Our next appointment is by telephone on 11/02/22 at 10am  Please call the care guide team at [phone] if you need to cancel or reschedule your appointment.   If you are experiencing a Mental Health or [place] or need someone to talk to, please call 911   Patient verbalizes understanding of instructions and care plan provided today and agrees to view in [place]. Active MyChart status and patient understanding of how to access instructions and care plan via MyChart confirmed with patient.     Telephone follow up appointment with care management team member scheduled for: 11/02/22  Elliot Gurney, [place] Worker  [facility] Care Management [phone]

## 2022-10-22 NOTE — Patient Outreach (Signed)
  Care Coordination   Follow Up Visit Note   10/22/2022 Name: Natasha [name] MRN: [id number] DOB: 29-Jun-1946  Natasha [name] is a 76 y.o. year old female who sees [name], NP for primary care. I spoke with  Natasha [name] daughter by phone today.  What matters to the patients health and wellness today?  Heating repairs    Goals Addressed             This Visit's Progress    In home care needs       Care Coordination Interventions: Follow up phone call to patient's daughter regarding update on heating repairs Patient currently in the ED Patient's daughter confirms that several contractors have been to the home to assess the repairs, however no work has been started yet as work in very involved Patient's daughter confirms that she has purchased a Best boy in the living room, O2 has been moved to the dining room This Education officer, museum will follow up with patient  and status of heating unit post discharge from the hospital        SDOH assessments and interventions completed:  No     Care Coordination Interventions:  Yes, provided   Follow up plan: Follow up call scheduled for 11/02/22    Encounter Outcome:  Pt. Visit Completed

## 2022-10-22 NOTE — Progress Notes (Signed)
[facility] [place] NAME: Natasha [name]    MR#:  [id number]  DATE OF BIRTH:  18-Nov-1945  SUBJECTIVE:  Seen in the ER Came in with SOB. Feeling better today Cont to smoke Uses oxygen No family in ER    VITALS:  Blood pressure 111/63, pulse 75, temperature 98.1 F (36.7 C), temperature source Oral, resp. rate 14, height '5\' 4"'$  (1.626 m), weight 63.5 kg, SpO2 94 %.  PHYSICAL EXAMINATION:   GENERAL:  76 y.o.-year-old patient lying in the bed with no acute distress. Disheveled, chronically ill appearing LUNGS: decreased breath sounds bilaterally, no wheezing CARDIOVASCULAR: S1, S2 normal. No murmur   ABDOMEN: Soft, nontender, nondistended. EXTREMITIES: No  edema b/l.    NEUROLOGIC: nonfocal  patient is alert and awake SKIN: No obvious rash, lesion, or ulcer.   LABORATORY PANEL:  CBC Recent Labs  Lab 10/21/22 0559  WBC 3.9*  HGB 6.1*  HCT 22.0*  PLT 81*    Chemistries  Recent Labs  Lab 10/20/22 0945 10/21/22 0559 10/22/22 1313  NA 139 139  --   K 4.5 4.7  --   CL 107 106  --   CO2 24 25  --   GLUCOSE 206* 219*  --   BUN 33* 51*  --   CREATININE 0.86 1.18* 1.58*  CALCIUM 8.6* 8.2*  --   AST 21  --   --   ALT 11  --   --   ALKPHOS 200*  --   --   BILITOT 1.3*  --   --     Assessment and Plan 76 year old female with past medical history of B-cell lymphoma, COPD with chronic respiratory failure on 3 L nasal cannula, diastolic CHF and diabetes mellitus who presented to the emergency room on 12/27 with complaints of shortness of breath, fever and cough times several days. In the emergency room, patient found to be in significant respiratory distress with hypoxia requiring BiPAP and workup revealed patient positive for flu as well as a mild heart failure.   Severe sepsis (HCC) Meets criteria for severe sepsis on admission secondary to tachycardia, tachypnea, temperature greater than 100.5 from influenza as well as  bronchitis/pneumonia and hypotension.  Have stopped fluid resuscitation due to CHF.  Sepsis itself stabilized but infection persistent.  Have added scheduled 3 times daily midodrine to help with blood pressure.   COPD with acute exacerbation (HCC) Continue supplemental oxygen plus nebulizers and steroids.  Noted elevated procalcitonin so antibiotics.  Recheck procalcitonin in the morning.  Continue to trend down oxygen and taper down steroids.   Acute on chronic respiratory failure with hypoxia and hypercarbia (HCC) Patient normally on 4 L initially requiring BiPAP, currently down to 5 L   Influenza A On Tamiflu   Acute on chronic diastolic CHF (congestive heart failure) ([place]) For now, just on home dose of p.o. Lasix.    Neutropenia with fever (HCC)/pancytopenia Oncology following   AKI (acute kidney injury) ([place]) Secondary to hypotension.  Creatinine at 0.86 on admission, up to 1.18 x 12/28   Type II diabetes mellitus with renal manifestations (HCC) Sliding scale   Hypothyroidism Continue Synthroid   Overweight (BMI 25.0-29.9) Meets criteria for BMI greater than 25   Anemia associated with chemotherapy No evidence of active bleeding.  Hypotension is more due to severe sepsis.  Hemoglobin at 7.5 on day of admission, down to 6.1 the following day.  Will transfuse 2 units packed red blood cells.  Blood will help her anemia.   PT/OT to see   Procedures: Family communication :none today Consults :oncology CODE STATUS: FULL DVT Prophylaxis :scd Level of care: Progressive Status is: Inpatient Remains inpatient appropriate because: sepsis    TOTAL TIME TAKING CARE OF THIS PATIENT: 35 minutes.  >50% time spent on counselling and coordination of care  Note: This dictation was prepared with Dragon dictation along with smaller phrase technology. Any transcriptional errors that result from this process are unintentional.  Fritzi Mandes M.D    [facility]   CC: Primary  care physician; [name], NP

## 2022-10-22 NOTE — Consult Note (Signed)
Pharmacy Antibiotic Note  Natasha [name] is a 76 y.o. female admitted on 10/20/2022 with  febrile neutropenia .  Pharmacy has been consulted for cefepime dosing.  Patient is influenza A positive.  Scr increased 1.18 >>  1.58  Plan: Day 3 of antibiotics Adjust cefepime  from 2 gm IV q12h to Q24h  for Crcl 27 ml/min  F/u renal fxn   Height: '5\' 4"'$  (162.6 cm) Weight: 63.5 kg (140 lb) IBW/kg (Calculated) : 54.7  Temp (24hrs), Avg:98.5 F (36.9 C), Min:98 F (36.7 C), Max:99.2 F (37.3 C)  Recent Labs  Lab 10/20/22 0945 10/21/22 0559 10/22/22 1313  WBC 4.6 3.9*  --   CREATININE 0.86 1.18* 1.58*  LATICACIDVEN 0.9  --   --      Estimated Creatinine Clearance: 27 mL/min (A) (by C-G formula based on SCr of 1.58 mg/dL (H)).    No Known Allergies  Antimicrobials this admission: Acyclovir PTA>> Tamiflu 12/28>>   x5d vancomycin 12/27 >> 12/29 cefepime 12/27 >>    Dose adjustments this admission: 12/29 CFP 2gm q12h to q24h  Microbiology results: 12/27 BCx: ngtd 12/27 Sputum: NG<24hr 12/27 MRSA PCR: neg  Thank you for allowing pharmacy to be a part of this patient's care.  Octavis Sheeler A, PharmD 10/22/2022 1:47 PM

## 2022-10-22 NOTE — TOC Progression Note (Signed)
Transition of Care [facility]) - Progression Note    Patient Details  Name: Pansey Pinheiro Kintzel MRN: [id number] Date of Birth: September 15, 1946  Transition of Care [facility]) CM/SW Contact  Shelbie Hutching, RN Phone Number: 10/22/2022, 9:15 AM  Clinical Narrative:    [name] is saying that they cannot accept the patient back on services for home health due to home environment.  [facility] Care Management is following patient outpatient as she has reported not having heat for quite some time.     Expected Discharge Plan: [place] Barriers to Discharge: Continued Medical Work up  Expected Discharge Plan and Services     Post Acute Care Choice: Resumption of Svcs/PTA Provider Living arrangements for the past 2 months: [place]: PT, OT [facility] Agency: [place] Date Center: 10/21/22   Representative spoke with at [place]: Adela Lank   Social Determinants of Health (SDOH) Interventions SDOH Screenings   Food Insecurity: No Food Insecurity (09/18/2022)  Recent Concern: [place] Present (08/30/2022)  Housing: Low Risk  (09/18/2022)  Transportation Needs: No Transportation Needs (09/18/2022)  Recent Concern: Transportation Needs - Unmet Transportation Needs (08/30/2022)  Utilities: At Risk (10/06/2022)  Alcohol Screen: Low Risk  (08/24/2022)  Depression (PHQ2-9): Low Risk  (08/24/2022)  Financial Resource Strain: Medium Risk (08/24/2022)  Physical Activity: Inactive (08/24/2022)  Social Connections: Socially Isolated (08/24/2022)  Stress: Stress Concern Present (08/24/2022)  Tobacco Use: Medium Risk (10/20/2022)    Readmission Risk Interventions    10/21/2022    1:13 PM 08/17/2022   11:05 AM 08/04/2022    2:54 PM  Readmission Risk Prevention Plan  Transportation Screening Complete Complete Complete  PCP or Specialist Appt within 5-7 Days   Complete  PCP or Specialist Appt  within 3-5 Days  Complete   Home Care Screening   Complete  Medication Review (RN CM)   Complete  HRI or Home Care Consult  Complete   Social Work Consult for [place] Planning/Counseling  Complete   Palliative Care Screening  Not Applicable   Medication Review Press photographer) Complete Complete   PCP or Specialist appointment within 3-5 days of discharge Complete    HRI or [place] Complete    SW Recovery Care/Counseling Consult Complete    [place] Not Applicable

## 2022-10-23 DIAGNOSIS — Z9289 Personal history of other medical treatment: Secondary | ICD-10-CM | POA: Diagnosis not present

## 2022-10-23 DIAGNOSIS — J441 Chronic obstructive pulmonary disease with (acute) exacerbation: Secondary | ICD-10-CM | POA: Diagnosis not present

## 2022-10-23 DIAGNOSIS — J101 Influenza due to other identified influenza virus with other respiratory manifestations: Secondary | ICD-10-CM

## 2022-10-23 DIAGNOSIS — C8300 Small cell B-cell lymphoma, unspecified site: Secondary | ICD-10-CM | POA: Diagnosis not present

## 2022-10-23 DIAGNOSIS — J9601 Acute respiratory failure with hypoxia: Secondary | ICD-10-CM | POA: Diagnosis not present

## 2022-10-23 LAB — BPAM RBC
Blood Product Expiration Date: 202401192359
Blood Product Expiration Date: 202401192359
ISSUE DATE / TIME: 202312290025
ISSUE DATE / TIME: 202312290334
Unit Type and Rh: 5100
Unit Type and Rh: 5100

## 2022-10-23 LAB — TYPE AND SCREEN
ABO/RH(D): O POS
Antibody Screen: NEGATIVE
Unit division: 0
Unit division: 0

## 2022-10-23 LAB — GLUCOSE, CAPILLARY
Glucose-Capillary: 271 mg/dL — ABNORMAL HIGH (ref 70–99)
Glucose-Capillary: 320 mg/dL — ABNORMAL HIGH (ref 70–99)
Glucose-Capillary: 288 mg/dL — ABNORMAL HIGH (ref 70–99)
Glucose-Capillary: 333 mg/dL — ABNORMAL HIGH (ref 70–99)

## 2022-10-23 LAB — CULTURE, RESPIRATORY W GRAM STAIN: Culture: NORMAL

## 2022-10-23 LAB — CREATININE, SERUM
Creatinine, Ser: 1.74 mg/dL — ABNORMAL HIGH (ref 0.44–1.00)
GFR, Estimated: 30 mL/min — ABNORMAL LOW (ref >60)

## 2022-10-23 MED ORDER — SODIUM CHLORIDE 0.9 % IV SOLN: INTRAVENOUS | Status: DC | PRN

## 2022-10-23 NOTE — Plan of Care (Signed)
  Problem: Coping: Goal: Ability to adjust to condition or change in health will improve Outcome: Progressing   Problem: Nutritional: Goal: Maintenance of adequate nutrition will improve Outcome: Progressing   Problem: Activity: Goal: Risk for activity intolerance will decrease Outcome: Progressing   Problem: Pain Managment: Goal: General experience of comfort will improve Outcome: Progressing   Problem: Safety: Goal: Ability to remain free from injury will improve Outcome: Progressing

## 2022-10-23 NOTE — Evaluation (Signed)
Physical Therapy Evaluation Patient Details Name: Natasha [name] MRN: [id number] DOB: July 04, 1946 Today's Date: 10/23/2022  History of Present Illness  Patient is a 76 year old female with medical history significant for COPD, chronic hypoxic respiratory failure, chronic HFpEF, type 2 diabetes mellitus, hypothyroidism, chronic anemia and thrombocytopenia, and marginal zone lymphoma who presents to the emergency department with increased shortness of breath, leg swelling, and fatigue.  Clinical Impression  Pt pleasant and motivated with PT session, though she had labored breathing and generally fatigued t/o the session.  On arrival, on baseline 4L, O2 in the mid 80s, did get to low 90s with a few minutes of focused breathing.  She was able to get to EOB and to standing w/o physical assist, and was able to do some limited in-room ambulation but ultimately fatigued very quickly (SpO2 to 70s) and could not do prolonged standing/walking acts, returned to recliner post session with SpO2 back to upper 80s after return to sitting.  Discussed with nursing, pt will benefit from further PT to address functional limitations and assist with safe return home.     Recommendations for follow up therapy are one component of a multi-disciplinary discharge planning process, led by the attending physician.  Recommendations may be updated based on patient status, additional functional criteria and insurance authorization.  Follow Up Recommendations Home health PT      Assistance Recommended at Discharge Intermittent Supervision/Assistance  Patient can return home with the following  A little help with walking and/or transfers;A little help with bathing/dressing/bathroom;Assist for transportation;Assistance with cooking/housework    Equipment Recommendations None recommended by PT  Recommendations for Other Services       Functional Status Assessment Patient has had a recent decline in their functional status and  demonstrates the ability to make significant improvements in function in a reasonable and predictable amount of time.     Precautions / Restrictions Precautions Precautions: Fall Restrictions Weight Bearing Restrictions: No      Mobility  Bed Mobility Overal bed mobility: Modified Independent             General bed mobility comments: Pt was able to get herself to sitting EOB w/o assist, heavy use of rails    Transfers Overall transfer level: Needs assistance Equipment used: Rolling walker (2 wheels) Transfers: Sit to/from Stand Sit to Stand: Min guard           General transfer comment: Cuing for set up and sequencing, but able to rise to standing w/o physcial assist.    Ambulation/Gait Ambulation/Gait assistance: Min assist, Min guard Gait Distance (Feet): 20 Feet Assistive device: Rolling walker (2 wheels)         General Gait Details: Pt was able to do some minimal in-room ambulation with walker but was very quick to fatigue.  She displayed some (fatigue related) unsteadiness, but did not have any LOBs or overt safety issues.  SOB, pt's O2 in the 70s after the relatively modest effort.  Stairs            Wheelchair Mobility    Modified Rankin (Stroke Patients Only)       Balance Overall balance assessment: Mild deficits observed, not formally tested (reliant on walker)                                           Pertinent Vitals/Pain Pain Assessment Pain Assessment: Faces  Faces Pain Scale: Hurts even more Pain Location: chest pain with coughing    Home Living Family/patient expects to be discharged to:: Private residence Living Arrangements: Non-relatives/Friends Available Help at Discharge: Friend(s);Family Type of Home: House Home Access: Stairs to enter Entrance Stairs-Rails: None Entrance Stairs-Number of Steps: 2-3   Home Layout: One level Home Equipment: Conservation officer, nature (2 wheels);Cane - single point Additional  Comments: 4L O2    Prior Function Prior Level of Function : Needs assist             Mobility Comments: Mod I for ambulation with 4 L02 reported at baseline. She reports she has started consistently using DME recently. limited endurance ADLs Comments: Patient does not drive and reliant on her daughter for groceries. She reports her roommate manages her medications. She reports [facility] provides her with transportation to her MD appointments     Hand Dominance        Extremity/Trunk Assessment   Upper Extremity Assessment Upper Extremity Assessment: Generalized weakness    Lower Extremity Assessment Lower Extremity Assessment: Generalized weakness       Communication   Communication: No difficulties  Cognition Arousal/Alertness: Awake/alert Behavior During Therapy: WFL for tasks assessed/performed Overall Cognitive Status: Within Functional Limits for tasks assessed                                          General Comments General comments (skin integrity, edema, etc.): On arrival on 4L, SpO2 mid 80s at rest, coughing and labored breathing t/o the session.  O2 in to the 70s with modest bout of ambulation.    Exercises     Assessment/Plan    PT Assessment Patient needs continued PT services  PT Problem List Decreased strength;Decreased activity tolerance;Decreased balance;Decreased safety awareness;Cardiopulmonary status limiting activity;Pain;Decreased knowledge of use of DME;Decreased mobility       PT Treatment Interventions DME instruction;Gait training;Functional mobility training;Stair training;Therapeutic activities;Balance training;Therapeutic exercise;Patient/family education    PT Goals (Current goals can be found in the Care Plan section)  Acute Rehab PT Goals Patient Stated Goal: Get breathing better and go home PT Goal Formulation: With patient Time For Goal Achievement: 11/05/22 Potential to Achieve Goals: Good     Frequency Min 2X/week     Co-evaluation               AM-PAC PT "6 Clicks" Mobility  Outcome Measure Help needed turning from your back to your side while in a flat bed without using bedrails?: None Help needed moving from lying on your back to sitting on the side of a flat bed without using bedrails?: None Help needed moving to and from a bed to a chair (including a wheelchair)?: A Little Help needed standing up from a chair using your arms (e.g., wheelchair or bedside chair)?: A Little Help needed to walk in hospital room?: A Little Help needed climbing 3-5 steps with a railing? : A Lot 6 Click Score: 19    End of Session Equipment Utilized During Treatment: Gait belt;Oxygen Activity Tolerance: Patient limited by fatigue Patient left: with call bell/phone within reach;with chair alarm set Nurse Communication: Mobility status (O2 during activity) PT Visit Diagnosis: Muscle weakness (generalized) (M62.81);Unsteadiness on feet (R26.81);Difficulty in walking, not elsewhere classified (R26.2)    Time: 9735-3299 PT Time Calculation (min) (ACUTE ONLY): 23 min   Charges:   PT Evaluation $PT Eval Low  Complexity: 1 Low PT Treatments $Gait Training: 8-22 mins        Kreg Shropshire, DPT 10/23/2022, 4:23 PM

## 2022-10-23 NOTE — Progress Notes (Signed)
[facility] [place] NAME: Natasha [name]    MR#:  [id number]  DATE OF BIRTH:  1946/03/04  SUBJECTIVE:   Came in with SOB. Feeling better today Cont to smoke Uses oxygen No family at bedside  VITALS:  Blood pressure 110/63, pulse 77, temperature 98.1 F (36.7 C), resp. rate 18, height '5\' 4"'$  (1.626 m), weight 72.7 kg, SpO2 93 %.  PHYSICAL EXAMINATION:   GENERAL:  76 y.o.-year-old patient lying in the bed with no acute distress. Disheveled, chronically ill appearing LUNGS: decreased breath sounds bilaterally, no wheezing CARDIOVASCULAR: S1, S2 normal. No murmur   ABDOMEN: Soft, nontender, nondistended. EXTREMITIES: No  edema b/l.    NEUROLOGIC: nonfocal  patient is alert and awake LABORATORY PANEL:  CBC Recent Labs  Lab 10/21/22 0559 10/22/22 2039  WBC 3.9*  --   HGB 6.1* 8.7*  HCT 22.0*  --   PLT 81*  --      Chemistries  Recent Labs  Lab 10/20/22 0945 10/21/22 0559 10/22/22 1313 10/23/22 0456  NA 139 139  --   --   K 4.5 4.7  --   --   CL 107 106  --   --   CO2 24 25  --   --   GLUCOSE 206* 219*  --   --   BUN 33* 51*  --   --   CREATININE 0.86 1.18*   < > 1.74*  CALCIUM 8.6* 8.2*  --   --   AST 21  --   --   --   ALT 11  --   --   --   ALKPHOS 200*  --   --   --   BILITOT 1.3*  --   --   --    < > = values in this interval not displayed.     Assessment and Plan 76 year old female with past medical history of B-cell lymphoma, COPD with chronic respiratory failure on 3 L nasal cannula, diastolic CHF and diabetes mellitus who presented to the emergency room on 12/27 with complaints of shortness of breath, fever and cough times several days. In the emergency room, patient found to be in significant respiratory distress with hypoxia requiring BiPAP and workup revealed patient positive for flu as well as a mild heart failure.   Severe sepsis (HCC) --Meets criteria for severe sepsis on admission secondary to  tachycardia, tachypnea, temperature greater than 100.5 from influenza as well as bronchitis/pneumonia and hypotension.  Have stopped fluid resuscitation due to CHF.  improved sepsis -- Have added scheduled 3 times daily midodrine to help with blood pressure.   COPD with acute exacerbation (HCC) --Continue supplemental oxygen plus nebulizers and steroids.  --empiric abxs --cont chronic home oxygen   Acute on chronic respiratory failure with hypoxia and hypercarbia (HCC) Recurrent right Pleural effusion Patient normally on 4 L initially requiring BiPAP, currently down to 4 L -- US guided thoracentesis   Influenza A On Tamiflu   Acute on chronic diastolic CHF (congestive heart failure) ([place]) For now, just on home dose of p.o. Lasix.    Neutropenia with fever (HCC)/pancytopenia Oncology following--OK to d/c Granix per Dr Janese Banks   AKI (acute kidney injury) [facility]) Secondary to hypotension.  Creatinine at 0.86 on admission, up to 1.7 --hold lasix   Type II diabetes mellitus with renal manifestations (HCC) Sliding scale   Hypothyroidism Continue Synthroid   Overweight (BMI 25.0-29.9) Meets criteria for BMI greater than 25  Anemia associated with chemotherapy No evidence of active bleeding.  Hypotension is more due to severe sepsis.  Hemoglobin at 7.5 on day of admission, down to 6.1 the following day.  Hgb stable to 8.7  PT/OT to see   Procedures: Family communication :none today Consults :oncology CODE STATUS: FULL DVT Prophylaxis :scd Level of care: Progressive Status is: Inpatient Remains inpatient appropriate because: sepsis    TOTAL TIME TAKING CARE OF THIS PATIENT: 35 minutes.  >50% time spent on counselling and coordination of care  Note: This dictation was prepared with Dragon dictation along with smaller phrase technology. Any transcriptional errors that result from this process are unintentional.  Fritzi Mandes M.D    [facility]   CC: Primary care  physician; [name], NP

## 2022-10-24 DIAGNOSIS — J101 Influenza due to other identified influenza virus with other respiratory manifestations: Secondary | ICD-10-CM | POA: Diagnosis not present

## 2022-10-24 DIAGNOSIS — J441 Chronic obstructive pulmonary disease with (acute) exacerbation: Secondary | ICD-10-CM | POA: Diagnosis not present

## 2022-10-24 DIAGNOSIS — C8300 Small cell B-cell lymphoma, unspecified site: Secondary | ICD-10-CM | POA: Diagnosis not present

## 2022-10-24 DIAGNOSIS — J9601 Acute respiratory failure with hypoxia: Secondary | ICD-10-CM | POA: Diagnosis not present

## 2022-10-24 LAB — GLUCOSE, CAPILLARY
Glucose-Capillary: 236 mg/dL — ABNORMAL HIGH (ref 70–99)
Glucose-Capillary: 352 mg/dL — ABNORMAL HIGH (ref 70–99)
Glucose-Capillary: 355 mg/dL — ABNORMAL HIGH (ref 70–99)
Glucose-Capillary: 322 mg/dL — ABNORMAL HIGH (ref 70–99)

## 2022-10-24 NOTE — Progress Notes (Signed)
[facility] [place] [facility] [place] NAME: Darra Yost    MR#:  [id number]  DATE OF BIRTH:  03/18/1946  SUBJECTIVE:   Came in with SOB. Feeling better today Cont to smoke Uses oxygen chronically No family at bedside  VITALS:  Blood pressure 110/68, pulse 85, temperature 98 F (36.7 C), temperature source Oral, resp. rate 16, height '5\' 4"'$  (1.626 m), weight 67.4 kg, SpO2 96 %.  PHYSICAL EXAMINATION:   GENERAL:  76 y.o.-year-old patient lying in the bed with no acute distress. Disheveled, chronically ill appearing LUNGS: decreased breath sounds bilaterally, no wheezing CARDIOVASCULAR: S1, S2 normal. No murmur   NEUROLOGIC: nonfocal  patient is alert and awake LABORATORY PANEL:  CBC Recent Labs  Lab 10/21/22 0559 10/22/22 2039  WBC 3.9*  --   HGB 6.1* 8.7*  HCT 22.0*  --   PLT 81*  --      Chemistries  Recent Labs  Lab 10/20/22 0945 10/21/22 0559 10/22/22 1313 10/23/22 0456  NA 139 139  --   --   K 4.5 4.7  --   --   CL 107 106  --   --   CO2 24 25  --   --   GLUCOSE 206* 219*  --   --   BUN 33* 51*  --   --   CREATININE 0.86 1.18*   < > 1.74*  CALCIUM 8.6* 8.2*  --   --   AST 21  --   --   --   ALT 11  --   --   --   ALKPHOS 200*  --   --   --   BILITOT 1.3*  --   --   --    < > = values in this interval not displayed.     Assessment and Plan 76 year old female with past medical history of B-cell lymphoma, COPD with chronic respiratory failure on 3 L nasal cannula, diastolic CHF and diabetes mellitus who presented to the emergency room on 12/27 with complaints of shortness of breath, fever and cough times several days. In the emergency room, patient found to be in significant respiratory distress with hypoxia requiring BiPAP and workup revealed patient positive for flu as well as a mild heart failure.   Severe sepsis (HCC) --Meets criteria for severe sepsis on admission secondary to tachycardia, tachypnea, temperature greater than  100.5 from influenza as well as bronchitis/pneumonia and hypotension.  Have stopped fluid resuscitation due to CHF.  improved sepsis -- Have added scheduled 3 times daily midodrine to help with blood pressure.   COPD with acute exacerbation (HCC) --Continue supplemental oxygen plus nebulizers and steroids.  --empiric abxs --cont chronic home oxygen   Acute on chronic respiratory failure with hypoxia and hypercarbia (HCC) Recurrent right Pleural effusion Patient normally on 4 L initially requiring BiPAP, currently down to 4 L -- US guided thoracentesis ordered. Will try and see if IR can do it (holiday weekend--non-urgent) --repeat CXR in am to see status of fluid   Influenza A On Tamiflu   Acute on chronic diastolic CHF (congestive heart failure) ([place]) For now, just on home dose of p.o. Lasix.    Neutropenia with fever (HCC)/pancytopenia Oncology following--OK to d/c Granix per Dr Janese Banks   AKI (acute kidney injury) [facility]) Secondary to hypotension.  Creatinine at 0.86 on admission, up to 1.7 --hold lasix   Type II diabetes mellitus with renal manifestations (HCC) Sliding scale   Hypothyroidism Continue  Synthroid   Overweight (BMI 25.0-29.9) Meets criteria for BMI greater than 25   Anemia associated with chemotherapy No evidence of active bleeding.  Hypotension is more due to severe sepsis.  Hemoglobin at 7.5 on day of admission, down to 6.1 the following day.  Hgb stable to 8.7  PT/OT --HHPT   Procedures: Family communication :Jaime --dter Consults :oncology CODE STATUS: FULL DVT Prophylaxis :scd Level of care: Telemetry Medical Status is: Inpatient Remains inpatient appropriate because: improving Will try US thoracentesis 1/1 if IR ai able to do it. If not, and pt is stable she will d/c home--pt and dter agreeable    TOTAL TIME TAKING CARE OF THIS PATIENT: 35 minutes.  >50% time spent on counselling and coordination of care  Note: This dictation was prepared with  Dragon dictation along with smaller phrase technology. Any transcriptional errors that result from this process are unintentional.  Fritzi Mandes M.D    [facility]   CC: Primary care physician; [name], NP

## 2022-10-24 NOTE — Progress Notes (Signed)
Patient transferred to room 124. Report given to 1C nurse. All belongings with pt.

## 2022-10-24 NOTE — TOC Progression Note (Signed)
Transition of Care [facility]) - Progression Note    Patient Details  Name: Natasha [name] MRN: [id number] Date of Birth: 1946/10/23  Transition of Care [facility]) CM/SW Contact  Eileen Stanford, LCSW Phone Number: 10/24/2022, 3:44 PM  Clinical Narrative:   [facility] will service pt.    Expected Discharge Plan: [place] Barriers to Discharge: Continued Medical Work up  Expected Discharge Plan and Services     Post Acute Care Choice: Resumption of Svcs/PTA Provider Living arrangements for the past 2 months: [place]: PT, OT [place] Agency: [place] Date [place]: 10/21/22   Representative spoke with at [place]: Adela Lank   Social Determinants of Health (SDOH) Interventions SDOH Screenings   Food Insecurity: No Food Insecurity (09/18/2022)  Recent Concern: [place] Present (08/30/2022)  Housing: Low Risk  (09/18/2022)  Transportation Needs: No Transportation Needs (09/18/2022)  Recent Concern: Transportation Needs - Unmet Transportation Needs (08/30/2022)  Utilities: At Risk (10/06/2022)  Alcohol Screen: Low Risk  (08/24/2022)  Depression (PHQ2-9): Low Risk  (08/24/2022)  Financial Resource Strain: Medium Risk (08/24/2022)  Physical Activity: Inactive (08/24/2022)  Social Connections: Socially Isolated (08/24/2022)  Stress: Stress Concern Present (08/24/2022)  Tobacco Use: Medium Risk (10/20/2022)    Readmission Risk Interventions    10/21/2022    1:13 PM 08/17/2022   11:05 AM 08/04/2022    2:54 PM  Readmission Risk Prevention Plan  Transportation Screening Complete Complete Complete  PCP or Specialist Appt within 5-7 Days   Complete  PCP or Specialist Appt within 3-5 Days  Complete   Home Care Screening   Complete  Medication Review (RN CM)   Complete  HRI or Home Care Consult  Complete   Social Work Consult for [place]  Planning/Counseling  Complete   Palliative Care Screening  Not Applicable   Medication Review Press photographer) Complete Complete   PCP or Specialist appointment within 3-5 days of discharge Complete    HRI or [place] Complete    SW Recovery Care/Counseling Consult Complete    [place] Not Applicable

## 2022-10-25 ENCOUNTER — Inpatient Hospital Stay: Payer: Medicare Other

## 2022-10-25 DIAGNOSIS — J441 Chronic obstructive pulmonary disease with (acute) exacerbation: Secondary | ICD-10-CM | POA: Diagnosis not present

## 2022-10-25 DIAGNOSIS — C8300 Small cell B-cell lymphoma, unspecified site: Secondary | ICD-10-CM | POA: Diagnosis not present

## 2022-10-25 DIAGNOSIS — J101 Influenza due to other identified influenza virus with other respiratory manifestations: Secondary | ICD-10-CM | POA: Diagnosis not present

## 2022-10-25 DIAGNOSIS — J9601 Acute respiratory failure with hypoxia: Secondary | ICD-10-CM | POA: Diagnosis not present

## 2022-10-25 LAB — CBC WITH DIFFERENTIAL/PLATELET
Abs Immature Granulocytes: 0.4 10*3/uL — ABNORMAL HIGH (ref 0.00–0.07)
Basophils Absolute: 0.1 10*3/uL (ref 0.0–0.1)
Basophils Relative: 2 %
Eosinophils Absolute: 0 10*3/uL (ref 0.0–0.5)
Eosinophils Relative: 0 %
HCT: 28.3 % — ABNORMAL LOW (ref 36.0–46.0)
Hemoglobin: 8.4 g/dL — ABNORMAL LOW (ref 12.0–15.0)
Immature Granulocytes: 4 %
Lymphocytes Relative: 84 %
Lymphs Abs: 7.6 10*3/uL — ABNORMAL HIGH (ref 0.7–4.0)
MCH: 28 pg (ref 26.0–34.0)
MCHC: 29.7 g/dL — ABNORMAL LOW (ref 30.0–36.0)
MCV: 94.3 fL (ref 80.0–100.0)
Monocytes Absolute: 0.4 10*3/uL (ref 0.1–1.0)
Monocytes Relative: 5 %
Neutro Abs: 0.5 10*3/uL — ABNORMAL LOW (ref 1.7–7.7)
Neutrophils Relative %: 5 %
Platelets: 82 10*3/uL — ABNORMAL LOW (ref 150–400)
RBC: 3 MIL/uL — ABNORMAL LOW (ref 3.87–5.11)
RDW: 17.4 % — ABNORMAL HIGH (ref 11.5–15.5)
Smear Review: NORMAL
WBC Morphology: ABNORMAL
WBC: 9.1 10*3/uL (ref 4.0–10.5)
nRBC: 1.4 % — ABNORMAL HIGH (ref 0.0–0.2)

## 2022-10-25 LAB — CULTURE, BLOOD (ROUTINE X 2)
Culture: NO GROWTH
Culture: NO GROWTH
Special Requests: ADEQUATE

## 2022-10-25 LAB — GLUCOSE, CAPILLARY
Glucose-Capillary: 271 mg/dL — ABNORMAL HIGH (ref 70–99)
Glucose-Capillary: 282 mg/dL — ABNORMAL HIGH (ref 70–99)
Glucose-Capillary: 197 mg/dL — ABNORMAL HIGH (ref 70–99)
Glucose-Capillary: 60 mg/dL — ABNORMAL LOW (ref 70–99)

## 2022-10-25 MED ORDER — FUROSEMIDE 20 MG PO TABS
20.0000 mg | ORAL_TABLET | Freq: Every day | ORAL | Status: DC
  Administered 2022-10-25 – 2022-10-27 (×3): 20 mg via ORAL
  Filled 2022-10-25 (×3): qty 1

## 2022-10-25 MED ORDER — INSULIN ASPART PROT & ASPART (70-30 MIX) 100 UNIT/ML ~~LOC~~ SUSP: 10.0000 [IU] | Freq: Two times a day (BID) | SUBCUTANEOUS | Status: DC

## 2022-10-25 MED ORDER — IBUPROFEN 400 MG PO TABS
400.0000 mg | ORAL_TABLET | Freq: Once | ORAL | Status: AC
  Administered 2022-10-25: 400 mg via ORAL
  Filled 2022-10-25: qty 1

## 2022-10-25 MED ORDER — INSULIN ASPART PROT & ASPART (70-30 MIX) 100 UNIT/ML ~~LOC~~ SUSP
8.0000 [IU] | Freq: Two times a day (BID) | SUBCUTANEOUS | Status: DC
  Administered 2022-10-25 (×2): 8 [IU] via SUBCUTANEOUS

## 2022-10-25 NOTE — Progress Notes (Addendum)
[facility] [place] NAME: Natasha [name]    MR#:  [id number]  DATE OF BIRTH:  [birth year]  SUBJECTIVE:   Came in with SOB.  Cont to smoke Uses oxygen chronically No family at bedside  VITALS:  Blood pressure 120/61, pulse 72, temperature 98 F (36.7 C), temperature source Oral, resp. rate 20, height '5\' 4"'$  (1.626 m), weight 67.4 kg, SpO2 98 %.  PHYSICAL EXAMINATION:   GENERAL:  77 y.o.-year-old patient lying in the bed with no acute distress. Disheveled, chronically ill appearing LUNGS: decreased breath sounds bilaterally, no wheezing CARDIOVASCULAR: S1, S2 normal. No murmur   NEUROLOGIC: nonfocal  patient is alert and awake LABORATORY PANEL:  CBC Recent Labs  Lab 10/25/22 0600  WBC 9.1  HGB 8.4*  HCT 28.3*  PLT 82*     Chemistries  Recent Labs  Lab 10/20/22 0945 10/21/22 0559 10/22/22 1313 10/23/22 0456  NA 139 139  --   --   K 4.5 4.7  --   --   CL 107 106  --   --   CO2 24 25  --   --   GLUCOSE 206* 219*  --   --   BUN 33* 51*  --   --   CREATININE 0.86 1.18*   < > 1.74*  CALCIUM 8.6* 8.2*  --   --   AST 21  --   --   --   ALT 11  --   --   --   ALKPHOS 200*  --   --   --   BILITOT 1.3*  --   --   --    < > = values in this interval not displayed.     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 In the emergency room, patient found to be in significant respiratory distress with hypoxia requiring BiPAP and workup revealed patient positive for flu as well as a mild heart failure.   Severe sepsis (HCC) --Meets criteria for severe sepsis on admission secondary to tachycardia, tachypnea, temperature greater than 100.5 from influenza as well as bronchitis/pneumonia  and hypotension.  Have stopped fluid resuscitation due to CHF.  improved sepsis -- Have added scheduled 3 times daily midodrine to help with blood pressure.   COPD with acute exacerbation (HCC) --Continue supplemental oxygen plus nebulizers and steroids.  --empiric abxs --cont chronic home oxygen   Acute on chronic respiratory failure with hypoxia and hypercarbia (HCC) Recurrent right Pleural effusion Patient normally on 4 L initially requiring BiPAP, currently down to 4 L -- US guided thoracentesis ordered. Will try and see if IR can do it (holiday weekend--non-urgent) --repeat CXR shows worsening right sided. --messaged IR dr shick to see if she can get thoracentesis   Influenza A On Tamiflu   Acute on chronic diastolic CHF (congestive heart failure) ([place]) For now, just on home dose of p.o. Lasix.    Neutropenia with fever (HCC)/pancytopenia Oncology following--OK to d/c Granix per Dr Janese Banks   AKI (acute kidney injury) [facility]) Secondary to hypotension.  Creatinine at 0.86 on admission, up to 1.7 --hold lasix   Type II diabetes mellitus with renal manifestations (HCC) Sliding scale   Hypothyroidism Continue Synthroid   Overweight (BMI 25.0-29.9)  Meets criteria for BMI greater than 25   Anemia associated with chemotherapy No evidence of active bleeding.  Hypotension is more due to severe sepsis.  Hemoglobin at 7.5 on day of admission, down to 6.1 the following day.  Hgb stable to 8.7  PT/OT --HHPT   Procedures: Family communication :Jaime --dter Consults :oncology CODE STATUS: FULL DVT Prophylaxis :scd Level of care: Telemetry Medical Status is: Inpatient Remains inpatient appropriate because: improving Awaiting US thoracentesis--per IR being holiday weekend will do it tomorrow  TOTAL TIME TAKING CARE OF THIS PATIENT: 35 minutes.  >50% time spent on counselling and coordination of care  Note: This dictation was prepared with Dragon dictation along with smaller  phrase technology. Any transcriptional errors that result from this process are unintentional.  Fritzi Mandes M.D    [facility]   CC: Primary care physician; [name], NP

## 2022-10-25 NOTE — Care Management Important Message (Signed)
Important Message  Patient Details  Name: Natasha [name] MRN: [id number] Date of Birth: 02/09/1946   Medicare Important Message Given:  Yes  Reviewed Medicare IM with patient via room phone [phone]).  Copy of Medicare IM placed in mail to home address on file.    Dannette Barbara 10/25/2022, 3:06 PM

## 2022-10-26 ENCOUNTER — Other Ambulatory Visit (HOSPITAL_COMMUNITY): Payer: Self-pay

## 2022-10-26 ENCOUNTER — Inpatient Hospital Stay: Payer: Medicare Other

## 2022-10-26 ENCOUNTER — Ambulatory Visit: Payer: Self-pay

## 2022-10-26 DIAGNOSIS — J441 Chronic obstructive pulmonary disease with (acute) exacerbation: Secondary | ICD-10-CM | POA: Diagnosis not present

## 2022-10-26 DIAGNOSIS — C8300 Small cell B-cell lymphoma, unspecified site: Secondary | ICD-10-CM | POA: Diagnosis not present

## 2022-10-26 DIAGNOSIS — J9601 Acute respiratory failure with hypoxia: Secondary | ICD-10-CM | POA: Diagnosis not present

## 2022-10-26 DIAGNOSIS — J101 Influenza due to other identified influenza virus with other respiratory manifestations: Secondary | ICD-10-CM | POA: Diagnosis not present

## 2022-10-26 LAB — GLUCOSE, CAPILLARY
Glucose-Capillary: 74 mg/dL (ref 70–99)
Glucose-Capillary: 136 mg/dL — ABNORMAL HIGH (ref 70–99)
Glucose-Capillary: 125 mg/dL — ABNORMAL HIGH (ref 70–99)
Glucose-Capillary: 77 mg/dL (ref 70–99)

## 2022-10-26 LAB — HEMOGLOBIN A1C
Hgb A1c MFr Bld: 5.6 % (ref 4.8–5.6)
Mean Plasma Glucose: 114 mg/dL

## 2022-10-26 LAB — PATHOLOGIST SMEAR REVIEW

## 2022-10-26 MED ORDER — INSULIN ASPART PROT & ASPART (70-30 MIX) 100 UNIT/ML ~~LOC~~ SUSP
5.0000 [IU] | Freq: Two times a day (BID) | SUBCUTANEOUS | Status: DC
  Administered 2022-10-26 – 2022-10-28 (×4): 5 [IU] via SUBCUTANEOUS
  Filled 2022-10-26: qty 10

## 2022-10-26 MED ORDER — IPRATROPIUM-ALBUTEROL 0.5-2.5 (3) MG/3ML IN SOLN
3.0000 mL | Freq: Three times a day (TID) | RESPIRATORY_TRACT | Status: DC
  Administered 2022-10-26 – 2022-10-28 (×5): 3 mL via RESPIRATORY_TRACT
  Filled 2022-10-26 (×5): qty 3

## 2022-10-26 NOTE — Patient Outreach (Signed)
  Care Coordination   Follow Up Visit Note   10/26/2022 Name: Alianah Lofton Schoenfelder MRN: [id number] DOB: 22-Oct-1946  Prentice Docker Meno is a 77 y.o. year old female who sees [name], NP for primary care. I spoke with  daughter, [name] by phone today.  What matters to the patients health and wellness today?  Ongoing care coordination follow up     Goals Addressed             This Visit's Progress    Patient / caregiver stated:  Management of health conditions       Care Coordination Interventions  Evaluation of current treatment plan related to current hospitalization due to influenza and patient's adherence to plan as established by provider:  Daughter states patient is doing much better and is scheduled for possible discharge on today or tomorrow.  Daughter states she purchased patient a space heater and moved oxygen concentrator to another room.   Daughter advised to contact home health agency once patient returns home to reinstate services.  Daughter advised to contact patients health insurance carrier for listing of participating primary care providers.  Discussed plans with patient for ongoing care management follow up and provided patient with direct contact information for care management team                SDOH assessments and interventions completed:  No     Care Coordination Interventions:  Yes, provided   Follow up plan: Follow up call scheduled for 10/28/22    Encounter Outcome:  Pt. Visit Completed   Quinn Plowman RN,BSN,CCM [place] [phone] direct line

## 2022-10-26 NOTE — Progress Notes (Signed)
Patient left floor via bed for thoracenteses in fair condition.

## 2022-10-26 NOTE — Progress Notes (Signed)
CCMD notified this RN of 4 beat run of v-tack.  Currently SR [id number].  Dr Posey Pronto made aware.

## 2022-10-26 NOTE — Progress Notes (Signed)
[facility] [place] NAME: Natasha [name]    MR#:  [id number]  DATE OF BIRTH:  [birth year]  SUBJECTIVE:   Came in with SOB.  Cont to smoke Uses oxygen chronically No family at bedside ultrasound-guided thoracentesis cancer today since blood pressure was 73/44. Patient remains asymptomatic little blood pressure. She is wondering if she can go home and, as outpatient for thoracentesis  VITALS:  Blood pressure (!) 99/59, pulse 84, temperature 97.7 F (36.5 C), resp. rate 18, height '5\' 4"'$  (1.626 m), weight 67.4 kg, SpO2 95 %.  PHYSICAL EXAMINATION:   GENERAL:  77 y.o.-year-old patient lying in the bed with no acute distress. Disheveled, chronically ill appearing LUNGS: decreased breath sounds bilaterally, no wheezing CARDIOVASCULAR: S1, S2 normal. No murmur   NEUROLOGIC: nonfocal  patient is alert and awake LABORATORY PANEL:  CBC Recent Labs  Lab 10/25/22 0600  WBC 9.1  HGB 8.4*  HCT 28.3*  PLT 82*     Chemistries  Recent Labs  Lab 10/20/22 0945 10/21/22 0559 10/22/22 1313 10/23/22 0456  NA 139 139  --   --   K 4.5 4.7  --   --   CL 107 106  --   --   CO2 24 25  --   --   GLUCOSE 206* 219*  --   --   BUN 33* 51*  --   --   CREATININE 0.86 1.18*   < > 1.74*  CALCIUM 8.6* 8.2*  --   --   AST 21  --   --   --   ALT 11  --   --   --   ALKPHOS 200*  --   --   --   BILITOT 1.3*  --   --   --    < > = values in this interval not displayed.     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 In the emergency room, patient found to be in significant respiratory distress with hypoxia requiring BiPAP and workup revealed patient positive for flu as well as a mild heart failure.    Severe sepsis (HCC) --Meets criteria for severe sepsis on admission secondary to tachycardia, tachypnea, temperature greater than 100.5 from influenza as well as bronchitis/pneumonia and hypotension.  Have stopped fluid resuscitation due to CHF.  improved sepsis -- Have added scheduled 3 times daily midodrine to help with blood pressure.  Acute on chronic respiratory failure with hypoxia and hypercarbia (HCC) COPD with acute exacerbation (HCC) Recurrent right Pleural effusion Patient normally on 4 L initially requiring BiPAP, currently down to 4 L -- US guided thoracentesis ordered. Will try and see if IR can do it (holiday weekend--non-urgent) --repeat CXR shows worsening right sided. --1/2-- patient went for her thoracentesis blood pressure was 73/44. IR canceled the procedure. Patient wants to get it done as outpatient however given her social situation and respiratory condition I did recommend hard to get it done tomorrow.  Chronic hypotension --cont Midodrine   Influenza A completed Tamiflu   Acute on chronic diastolic CHF (congestive heart failure) ([place]) For now, just on home dose of p.o. Lasix.    Neutropenia with fever (HCC)/pancytopenia Oncology following--OK to d/c Granix per Dr Janese Banks  Type II diabetes mellitus with renal manifestations (HCC) Sliding scale   Hypothyroidism Continue Synthroid   Overweight (BMI 25.0-29.9) Meets criteria for BMI greater than 25   Anemia associated with chemotherapy No evidence of active bleeding.  Hypotension is more due to severe sepsis.  Hemoglobin at 7.5 on day of admission, down to 6.1 the following day.  Hgb stable to 8.7  PT/OT --HHPT   Procedures: Family communication :Jaime --dter Consults :oncology CODE STATUS: FULL DVT Prophylaxis :scd Level of care: Telemetry Medical Status is: Inpatient Remains inpatient appropriate because: improving Awaiting US thoracentesis--IR will attempt tomorrow  TOTAL TIME TAKING CARE OF  THIS PATIENT: 35 minutes.  >50% time spent on counselling and coordination of care  Note: This dictation was prepared with Dragon dictation along with smaller phrase technology. Any transcriptional errors that result from this process are unintentional.  Fritzi Mandes M.D    [facility]   CC: Primary care physician; [name], NP

## 2022-10-26 NOTE — Inpatient Diabetes Management (Signed)
Inpatient Diabetes Program Recommendations  AACE/ADA: New Consensus Statement on Inpatient Glycemic Control   Target Ranges:  Prepandial:   less than 140 mg/dL      Peak postprandial:   less than 180 mg/dL (1-2 hours)      Critically ill patients:  140 - 180 mg/dL    Latest Reference Range & Units 10/25/22 08:32 10/25/22 12:16 10/25/22 16:10 10/25/22 21:34 10/26/22 08:10  Glucose-Capillary 70 - 99 mg/dL 271 (H) 282 (H) 197 (H) 60 (L) 74   Review of Glycemic Control  Diabetes history: DM2 Outpatient Diabetes medications: 70/30 5-10 units BID Current orders for Inpatient glycemic control: 70/30 8 units BID, Novolog 0-9 units TID with meals, Novolog 0-5 units QHS  Inpatient Diabetes Program Recommendations:    Insulin: CBG 60 mg/dl at 21:34 last night and CBG 74 mg/dl at 8:10 am today. Noted last steroids given on 10/24/22 (Solumedrol 40 mg). May want to consider decreasing 70/30 to 5 units BID.  Thanks, Barnie Alderman, RN, MSN, [place] Diabetes Coordinator Inpatient Diabetes Program [phone] (Team Pager from 8am to [place])

## 2022-10-26 NOTE — Progress Notes (Signed)
Physical Therapy Treatment Patient Details Name: Natasha [name] MRN: [id number] DOB: 04/03/1946 Today's Date: 10/26/2022   History of Present Illness Patient is a 77 year old female with medical history significant for COPD, chronic hypoxic respiratory failure, chronic HFpEF, type 2 diabetes mellitus, hypothyroidism, chronic anemia and thrombocytopenia, and marginal zone lymphoma who presents to the emergency department with increased shortness of breath, leg swelling, and fatigue.    PT Comments    Patient supine in bed on arrival and agreeable to PT tx session. Able to complete bed mobility modI and sit to stand transfer with min guard. Transferred to [facility] prior to ambulation due to urgency to void. Ambulated 33' with min guard and Rw on 4L O2 with spO2 92-94% throughout. Reports fatigue at end of ambulation. Left in recliner with chair alarm set. D/c plan remains appropriate.     Recommendations for follow up therapy are one component of a multi-disciplinary discharge planning process, led by the attending physician.  Recommendations may be updated based on patient status, additional functional criteria and insurance authorization.  Follow Up Recommendations  Home health PT     Assistance Recommended at Discharge Intermittent Supervision/Assistance  Patient can return home with the following A little help with walking and/or transfers;A little help with bathing/dressing/bathroom;Assist for transportation;Assistance with cooking/housework   Equipment Recommendations  None recommended by PT    Recommendations for Other Services       Precautions / Restrictions Precautions Precautions: Fall Precaution Comments: watch O2 Restrictions Weight Bearing Restrictions: No     Mobility  Bed Mobility Overal bed mobility: Modified Independent             General bed mobility comments: heavy use of bedrails but able to complete without assistance    Transfers Overall transfer level:  Needs assistance Equipment used: Rolling Kaitlan Bin (2 wheels) Transfers: Sit to/from Stand, Bed to chair/wheelchair/BSC Sit to Stand: Min guard   Step pivot transfers: Min guard       General transfer comment: min guard for safety to stand from low bed surface. Able to take pivotal steps with RW to void prior to ambulating    Ambulation/Gait Ambulation/Gait assistance: Min guard Gait Distance (Feet): 50 Feet Assistive device: Rolling Kaizen Ibsen (2 wheels) Gait Pattern/deviations: Step-through pattern, Decreased stride length, Trunk flexed Gait velocity: decreased     General Gait Details: ambulated short distance outside of room on 4L O2 with spO2 92-94% throughout   Stairs             Wheelchair Mobility    Modified Rankin (Stroke Patients Only)       Balance Overall balance assessment: Mild deficits observed, not formally tested                                          Cognition Arousal/Alertness: Awake/alert Behavior During Therapy: WFL for tasks assessed/performed Overall Cognitive Status: Within Functional Limits for tasks assessed                                          Exercises      General Comments        Pertinent Vitals/Pain Pain Assessment Pain Assessment: Faces Faces Pain Scale: Hurts a little bit Pain Location: chest during coughing Pain Descriptors / Indicators: Discomfort Pain Intervention(s):  Monitored during session, Repositioned    Home Living                          Prior Function            PT Goals (current goals can now be found in the care plan section) Acute Rehab PT Goals PT Goal Formulation: With patient Time For Goal Achievement: 11/05/22 Potential to Achieve Goals: Good Progress towards PT goals: Progressing toward goals    Frequency    Min 2X/week      PT Plan Current plan remains appropriate    Co-evaluation              AM-PAC PT "6 Clicks" Mobility    Outcome Measure  Help needed turning from your back to your side while in a flat bed without using bedrails?: None Help needed moving from lying on your back to sitting on the side of a flat bed without using bedrails?: None Help needed moving to and from a bed to a chair (including a wheelchair)?: A Little Help needed standing up from a chair using your arms (e.g., wheelchair or bedside chair)?: A Little Help needed to walk in hospital room?: A Little Help needed climbing 3-5 steps with a railing? : A Lot 6 Click Score: 19    End of Session Equipment Utilized During Treatment: Gait belt;Oxygen Activity Tolerance: Patient limited by fatigue Patient left: in chair;with call bell/phone within reach;with chair alarm set Nurse Communication: Mobility status PT Visit Diagnosis: Muscle weakness (generalized) (M62.81);Unsteadiness on feet (R26.81);Difficulty in walking, not elsewhere classified (R26.2)     Time: 5993-5701 PT Time Calculation (min) (ACUTE ONLY): 31 min  Charges:  $Gait Training: 8-22 mins $Therapeutic Activity: 8-22 mins                     Amirrah Quigley A. Gilford Rile PT, DPT [facility] - Acute Rehabilitation Services    Linna Hoff 10/26/2022, 2:37 PM

## 2022-10-27 ENCOUNTER — Inpatient Hospital Stay: Payer: Medicare Other

## 2022-10-27 DIAGNOSIS — A419 Sepsis, unspecified organism: Secondary | ICD-10-CM | POA: Diagnosis not present

## 2022-10-27 DIAGNOSIS — J90 Pleural effusion, not elsewhere classified: Secondary | ICD-10-CM | POA: Diagnosis not present

## 2022-10-27 DIAGNOSIS — R6520 Severe sepsis without septic shock: Secondary | ICD-10-CM | POA: Diagnosis not present

## 2022-10-27 DIAGNOSIS — J441 Chronic obstructive pulmonary disease with (acute) exacerbation: Secondary | ICD-10-CM | POA: Diagnosis not present

## 2022-10-27 LAB — GLUCOSE, CAPILLARY
Glucose-Capillary: 87 mg/dL (ref 70–99)
Glucose-Capillary: 179 mg/dL — ABNORMAL HIGH (ref 70–99)
Glucose-Capillary: 233 mg/dL — ABNORMAL HIGH (ref 70–99)
Glucose-Capillary: 75 mg/dL (ref 70–99)

## 2022-10-27 MED ORDER — LIDOCAINE HCL (PF) 1 % IJ SOLN
10.0000 mL | Freq: Once | INTRAMUSCULAR | Status: AC
  Administered 2022-10-27: 10 mL via SUBCUTANEOUS

## 2022-10-27 NOTE — Procedures (Signed)
PROCEDURE SUMMARY:  Successful image-guided right thoracentesis. Yielded 1.0 L of amber fluid. Pt tolerated procedure well. No immediate complications. EBL = trace   Specimen was not sent for labs. CXR ordered.  Please see imaging section of Epic for full dictation.  Lura Em PA-C 10/27/2022 3:50 PM

## 2022-10-27 NOTE — Progress Notes (Addendum)
Progress Note    Zanovia Rotz Valvo  YHC:[id number] DOB: 1946-05-06  DOA: 10/20/2022 PCP: [name], NP      Brief Narrative:    Medical records reviewed and are as summarized below:  [place] Rosamond Streck is a 77 y.o. female with medical history significant for B-cell lymphoma, COPD, chronic hypoxic respiratory failure on 3 L/min oxygen via nasal cannula, chronic diastolic CHF, diabetes mellitus, who presented to the hospital with shortness of breath, fever and cough.  Oxygen saturation was 68% on room air.     Assessment/Plan:   Principal Problem:   Severe sepsis (HCC) Active Problems:   Acute on chronic respiratory failure with hypoxia and hypercarbia (HCC)   COPD with acute exacerbation (HCC)   Recurrent right pleural effusion   Influenza A   Acute on chronic diastolic CHF (congestive heart failure) (HCC)   Nodal marginal zone B-cell lymphoma (HCC)   Neutropenic fever (HCC)   AKI (acute kidney injury) ([place])   Type II diabetes mellitus with renal manifestations ([place])   Hypothyroidism   Overweight (BMI 25.0-29.9)   Other pancytopenia (HCC)   Anemia associated with chemotherapy   Body mass index is 25.52 kg/m.    Severe sepsis secondary to influenza A pneumonia: Completed Tamiflu  Acute on chronic hypoxic respiratory failure, chronic hypercapnic respiratory failure: Previously required BiPAP on admission.  She is on 4 L/min oxygen via nasal cannula.  COPD exacerbation: Continue bronchodilators  Recurrent right pleural effusion: S/p right-sided thoracentesis with removal of 1 L of amber fluid on 10/27/2022.  Hypotension: Monitor BP closely especially given recent thoracentesis.  Continue midodrine.  Acute on chronic diastolic CHF: Improved.  Continue oral Lasix  Pancytopenia: Anemia improved with 2 units of blood transfusion on 10/22/2022.  Neutropenia improved with Granix.  Stage IV nodal marginal zone lymphoma: Acalabrutinib has been held because of  pneumonia.  Outpatient follow-up with oncologist.  Other comorbidities include type II DM, hypothyroidism     Diet Order             Diet heart healthy/carb modified Fluid consistency: Thin; Fluid restriction: 1500 mL Fluid  Diet effective now                            Consultants: Oncologist  Procedures: Right-sided thoracentesis on 10/27/2022    Medications:    acyclovir  400 mg Oral Daily   Chlorhexidine Gluconate Cloth  6 each Topical Daily   chlorpheniramine-HYDROcodone  5 mL Oral Q12H   clobetasol cream  1 Application Topical Daily   furosemide  20 mg Oral Daily   insulin aspart  0-5 Units Subcutaneous QHS   insulin aspart  0-9 Units Subcutaneous TID WC   insulin aspart protamine- aspart  5 Units Subcutaneous BID WC   ipratropium-albuterol  3 mL Nebulization TID   levothyroxine  50 mcg Oral Q0600   midodrine  10 mg Oral TID WC   Continuous Infusions:  sodium chloride Stopped (10/23/22 1038)     Anti-infectives (From admission, onward)    Start     Dose/Rate Route Frequency Ordered Stop   10/23/22 0800  ceFEPIme (MAXIPIME) 2 g in sodium chloride 0.9 % 100 mL IVPB  Status:  Discontinued        2 g 200 mL/hr over 30 Minutes Intravenous Every 24 hours 10/22/22 1405 10/25/22 0821   10/21/22 2200  oseltamivir (TAMIFLU) capsule 30 mg  Status:  Discontinued  30 mg Oral 2 times daily 10/21/22 1020 10/26/22 1147   10/21/22 1300  vancomycin (VANCOREADY) IVPB 1250 mg/250 mL  Status:  Discontinued        1,250 mg 166.7 mL/hr over 90 Minutes Intravenous Every 24 hours 10/20/22 1349 10/21/22 1114   10/21/22 1300  vancomycin (VANCOCIN) IVPB 1000 mg/200 mL premix  Status:  Discontinued        1,000 mg 200 mL/hr over 60 Minutes Intravenous Every 24 hours 10/21/22 1114 10/22/22 1027   10/21/22 1000  acyclovir (ZOVIRAX) 200 MG capsule 400 mg        400 mg Oral Daily 10/20/22 2003     10/21/22 0500  oseltamivir (TAMIFLU) capsule 75 mg  Status:   Discontinued        75 mg Oral 2 times daily 10/20/22 1331 10/21/22 1019   10/20/22 2030  ceFEPIme (MAXIPIME) 2 g in sodium chloride 0.9 % 100 mL IVPB  Status:  Discontinued        2 g 200 mL/hr over 30 Minutes Intravenous Every 12 hours 10/20/22 1349 10/22/22 1405   10/20/22 1500  oseltamivir (TAMIFLU) capsule 75 mg  Status:  Discontinued        75 mg Oral  Once 10/20/22 1342 10/26/22 1317   10/20/22 1300  oseltamivir (TAMIFLU) capsule 75 mg  Status:  Discontinued        75 mg Oral  Once 10/20/22 1245 10/20/22 1342   10/20/22 1145  vancomycin (VANCOREADY) IVPB 1750 mg/350 mL        1,750 mg 175 mL/hr over 120 Minutes Intravenous  Once 10/20/22 1124 10/20/22 1455   10/20/22 1130  ceFEPIme (MAXIPIME) 2 g in sodium chloride 0.9 % 100 mL IVPB        2 g 200 mL/hr over 30 Minutes Intravenous STAT 10/20/22 1118 10/20/22 1255   10/20/22 1130  vancomycin (VANCOCIN) IVPB 1000 mg/200 mL premix  Status:  Discontinued        1,000 mg 200 mL/hr over 60 Minutes Intravenous STAT 10/20/22 1119 10/20/22 1124              Family Communication/Anticipated D/C date and plan/Code Status   DVT prophylaxis: SCDs Start: 10/20/22 1333     Code Status: Full Code  Family Communication: None Disposition Plan: Plan to discharge home tomorrow   Status is: Inpatient Remains inpatient appropriate because: Hypotension, s/p thoracentesis       Subjective:   Interval events noted.  No shortness of breath or chest pain.  She was seen before thoracentesis today.  Objective:    Vitals:   10/27/22 1525 10/27/22 1550 10/27/22 1555 10/27/22 1719  BP: (!) 100/55 102/64 100/88 (!) 102/54  Pulse: 84 90 88 85  Resp:    18  Temp:    99 F (37.2 C)  TempSrc:    Oral  SpO2: 100% 100% 100% 95%  Weight:      Height:       No data found.   Intake/Output Summary (Last 24 hours) at 10/27/2022 1753 Last data filed at 10/27/2022 1145 Gross per 24 hour  Intake 240 ml  Output 1003 ml  Net -763 ml    Filed Weights   10/22/22 0409 10/23/22 1203 10/24/22 1003  Weight: 63.5 kg 72.7 kg 67.4 kg    Exam:  GEN: NAD SKIN: Warm and dry EYES: No pallor or icterus ENT: MMM CV: RRR PULM: Decreased air entry at the right lung base ABD: soft, ND, NT, +BS CNS:  AAO x 3, non focal EXT: No edema or tenderness         Data Reviewed:   I have personally reviewed following labs and imaging studies:  Labs: Labs show the following:   Basic Metabolic Panel: Recent Labs  Lab 10/21/22 0559 10/22/22 1313 10/23/22 0456  NA 139  --   --   K 4.7  --   --   CL 106  --   --   CO2 25  --   --   GLUCOSE 219*  --   --   BUN 51*  --   --   CREATININE 1.18* 1.58* 1.74*  CALCIUM 8.2*  --   --    GFR Estimated Creatinine Clearance: 26.8 mL/min (A) (by C-G formula based on SCr of 1.74 mg/dL (H)). Liver Function Tests: No results for input(s): "AST", "ALT", "ALKPHOS", "BILITOT", "PROT", "ALBUMIN" in the last 168 hours. No results for input(s): "LIPASE", "AMYLASE" in the last 168 hours. No results for input(s): "AMMONIA" in the last 168 hours. Coagulation profile No results for input(s): "INR", "PROTIME" in the last 168 hours.  CBC: Recent Labs  Lab 10/21/22 0559 10/22/22 2039 10/25/22 0600  WBC 3.9*  --  9.1  NEUTROABS  --   --  0.5*  HGB 6.1* 8.7* 8.4*  HCT 22.0*  --  28.3*  MCV 97.8  --  94.3  PLT 81*  --  82*   Cardiac Enzymes: No results for input(s): "CKTOTAL", "CKMB", "CKMBINDEX", "TROPONINI" in the last 168 hours. BNP (last 3 results) No results for input(s): "PROBNP" in the last 8760 hours. CBG: Recent Labs  Lab 10/26/22 1556 10/26/22 2111 10/27/22 0742 10/27/22 1153 10/27/22 1718  GLUCAP 125* 77 87 179* 233*   D-Dimer: No results for input(s): "DDIMER" in the last 72 hours. Hgb A1c: No results for input(s): "HGBA1C" in the last 72 hours. Lipid Profile: No results for input(s): "CHOL", "HDL", "LDLCALC", "TRIG", "CHOLHDL", "LDLDIRECT" in the last 72  hours. Thyroid function studies: No results for input(s): "TSH", "T4TOTAL", "T3FREE", "THYROIDAB" in the last 72 hours.  Invalid input(s): "FREET3" Anemia work up: No results for input(s): "VITAMINB12", "FOLATE", "FERRITIN", "TIBC", "IRON", "RETICCTPCT" in the last 72 hours. Sepsis Labs: Recent Labs  Lab 10/21/22 0559 10/25/22 0600  PROCALCITON 4.06  --   WBC 3.9* 9.1    Microbiology Recent Results (from the past 240 hour(s))  Blood Culture (routine x 2)     Status: None   Collection Time: 10/20/22  9:45 AM   Specimen: BLOOD  Result Value Ref Range Status   Specimen Description BLOOD RIGHT ANTECUBITAL  Final   Special Requests   Final    BOTTLES DRAWN AEROBIC AND ANAEROBIC Blood Culture adequate volume   Culture   Final    NO GROWTH 5 DAYS Performed at [facility], [address] [place], [place] [address]    Report Status 10/25/2022 FINAL  Final  Blood Culture (routine x 2)     Status: None   Collection Time: 10/20/22  9:45 AM   Specimen: BLOOD  Result Value Ref Range Status   Specimen Description BLOOD BLOOD RIGHT FOREARM  Final   Special Requests   Final    BOTTLES DRAWN AEROBIC AND ANAEROBIC Blood Culture results may not be optimal due to an inadequate volume of blood received in culture bottles   Culture   Final    NO GROWTH 5 DAYS Performed at [facility], [address], [place], [place] [address]  Report Status 10/25/2022 FINAL  Final  Resp Panel by RT-PCR (Flu A&B, Covid) Anterior Nasal Swab     Status: Abnormal   Collection Time: 10/20/22  9:45 AM   Specimen: Anterior Nasal Swab  Result Value Ref Range Status   SARS Coronavirus 2 by RT PCR NEGATIVE NEGATIVE Final    Comment: (NOTE) SARS-CoV-2 target nucleic acids are NOT DETECTED.  The SARS-CoV-2 RNA is generally detectable in upper respiratory specimens during the acute phase of infection. The lowest concentration of SARS-CoV-2 viral copies this assay can detect is 138  copies/mL. A negative result does not preclude SARS-Cov-2 infection and should not be used as the sole basis for treatment or other patient management decisions. A negative result may occur with  improper specimen collection/handling, submission of specimen other than nasopharyngeal swab, presence of viral mutation(s) within the areas targeted by this assay, and inadequate number of viral copies(<138 copies/mL). A negative result must be combined with clinical observations, patient history, and epidemiological information. The expected result is Negative.  Fact Sheet for Patients:  EntrepreneurPulse.com.au  Fact Sheet for Healthcare Providers:  IncredibleEmployment.be  This test is no t yet approved or cleared by the [place] FDA and  has been authorized for detection and/or diagnosis of SARS-CoV-2 by FDA under an Emergency Use Authorization (EUA). This EUA will remain  in effect (meaning this test can be used) for the duration of the COVID-19 declaration under Section 564(b)(1) of the Act, 21 U.S.C.section 360bbb-3(b)(1), unless the authorization is terminated  or revoked sooner.       Influenza A by PCR POSITIVE (A) NEGATIVE Final   Influenza B by PCR NEGATIVE NEGATIVE Final    Comment: (NOTE) The Xpert Xpress SARS-CoV-2/FLU/RSV plus assay is intended as an aid in the diagnosis of influenza from Nasopharyngeal swab specimens and should not be used as a sole basis for treatment. Nasal washings and aspirates are unacceptable for Xpert Xpress SARS-CoV-2/FLU/RSV testing.  Fact Sheet for Patients: EntrepreneurPulse.com.au  Fact Sheet for Healthcare Providers: IncredibleEmployment.be  This test is not yet approved or cleared by the [place] FDA and has been authorized for detection and/or diagnosis of SARS-CoV-2 by FDA under an Emergency Use Authorization (EUA). This EUA will remain in effect  (meaning this test can be used) for the duration of the COVID-19 declaration under Section 564(b)(1) of the Act, 21 U.S.C. section 360bbb-3(b)(1), unless the authorization is terminated or revoked.  Performed at [facility], [address] [place], [place] [address]   Expectorated Sputum Assessment w Gram Stain, Rflx to Resp Cult     Status: None   Collection Time: 10/20/22  1:33 PM   Specimen: Sputum  Result Value Ref Range Status   Specimen Description SPUTUM  Final   Special Requests NONE  Final   Sputum evaluation   Final    THIS SPECIMEN IS ACCEPTABLE FOR SPUTUM CULTURE Performed at [facility], [address], [place], [place] [address]    Report Status 10/21/2022 FINAL  Final  Culture, Respiratory w Gram Stain     Status: None   Collection Time: 10/20/22  1:33 PM   Specimen: SPU  Result Value Ref Range Status   Specimen Description   Final    SPUTUM Performed at [facility], [address] [place], [place] [address]    Special Requests   Final    NONE Reflexed from [id number] Performed at [facility], [address], [place], [place] [address]    Gram  Stain   Final    RARE WBC PRESENT,BOTH PMN AND MONONUCLEAR NO ORGANISMS SEEN    Culture   Final    RARE Normal respiratory flora-no Staph aureus or Pseudomonas seen Performed at [facility] [address], [place], [place] [address]    Report Status 10/23/2022 FINAL  Final  MRSA Next Gen by PCR, Nasal     Status: None   Collection Time: 10/20/22  1:50 PM   Specimen: Nasal Mucosa; Nasal Swab  Result Value Ref Range Status   MRSA by PCR Next Gen NOT DETECTED NOT DETECTED Final    Comment: Performed at [facility], [address] [place], [place] [address]    Procedures and diagnostic studies:  US Venous Img Lower Bilateral (DVT)  Result Date: 10/27/2022 CLINICAL DATA:  Bilateral leg pain. EXAM: BILATERAL LOWER EXTREMITY VENOUS DOPPLER ULTRASOUND  TECHNIQUE: Gray-scale sonography with graded compression, as well as color Doppler and duplex ultrasound were performed to evaluate the lower extremity deep venous systems from the level of the common femoral vein and including the common femoral, femoral, profunda femoral, popliteal and calf veins including the posterior tibial, peroneal and gastrocnemius veins when visible. The superficial great saphenous vein was also interrogated. Spectral Doppler was utilized to evaluate flow at rest and with distal augmentation maneuvers in the common femoral, femoral and popliteal veins. COMPARISON:  CT AP, 09/19/2022. FINDINGS: RIGHT LOWER EXTREMITY VENOUS Normal compressibility of the RIGHT common femoral, superficial femoral, and popliteal veins, as well as the visualized calf veins. Visualized portions of profunda femoral vein and great saphenous vein unremarkable. No filling defects to suggest DVT on grayscale or color Doppler imaging. Doppler waveforms show normal direction of venous flow, normal respiratory plasticity and response to augmentation. OTHER No evidence of superficial thrombophlebitis or abnormal fluid collection. Pathologically-enlarged RIGHT inguinal lymph nodes are imaged. Limitations: none LEFT LOWER EXTREMITY VENOUS Normal compressibility of the LEFT common femoral, superficial femoral, and popliteal veins, as well as the visualized calf veins. Visualized portions of profunda femoral vein and great saphenous vein unremarkable. No filling defects to suggest DVT on grayscale or color Doppler imaging. Doppler waveforms show normal direction of venous flow, normal respiratory plasticity and response to augmentation. OTHER No evidence of superficial thrombophlebitis or abnormal fluid collection. Pathologically-enlarged LEFT inguinal lymph nodes are imaged. Limitations: none IMPRESSION: 1. No evidence of femoropopliteal DVT or superficial thrombophlebitis within either lower extremity. 2. Inguinal  lymphadenopathy, best appreciated on most recent comparison CT and consistent with history of lymphoma. Michaelle Birks, MD [facility] Electronically Signed   By: Michaelle Birks M.D.   On: 10/27/2022 17:29   DG Chest Port 1 View  Result Date: 10/27/2022 CLINICAL DATA:  Post thoracentesis EXAM: PORTABLE CHEST 1 VIEW COMPARISON:  10/25/2022, CT 07/12/2022, chest x-ray 10/20/2022 FINDINGS: Right-sided central venous port tip over the SVC. Cardiomegaly with vascular congestion and probable edema. Moderate bilateral pleural effusions, increased on the left, decreased on the right. No right pneumothorax. Basilar airspace disease. IMPRESSION: 1. Moderate bilateral pleural effusions, increased on the left and decreased on the right. No pneumothorax. 2. Cardiomegaly with vascular congestion and probable edema. Basilar airspace disease persists. Electronically Signed   By: Donavan Foil M.D.   On: 10/27/2022 16:28   [place] CHEST (PLEURAL EFFUSION)  Result Date: 10/26/2022 CLINICAL DATA:  77 year old female with pleural effusion presenting for thoracentesis. EXAM: CHEST ULTRASOUND COMPARISON:  10/25/2022 FINDINGS: Large, simple appearing right pleural effusion. IMPRESSION: Large right pleural  effusion. No thoracentesis was performed at this time due to significant hypotension. The patient may return for thoracentesis once resolved. Ruthann Cancer, MD [facility] Electronically Signed   By: Ruthann Cancer M.D.   On: 10/26/2022 10:13               LOS: 7 days   Raymund Manrique  [facility]   Pager on [email or web address]. If 7PM-7AM, please contact night-coverage at [email or web address]     10/27/2022, 5:53 PM

## 2022-10-28 ENCOUNTER — Other Ambulatory Visit (HOSPITAL_COMMUNITY): Payer: Self-pay

## 2022-10-28 ENCOUNTER — Ambulatory Visit: Payer: Self-pay

## 2022-10-28 DIAGNOSIS — J9601 Acute respiratory failure with hypoxia: Secondary | ICD-10-CM | POA: Diagnosis not present

## 2022-10-28 DIAGNOSIS — A419 Sepsis, unspecified organism: Secondary | ICD-10-CM | POA: Diagnosis not present

## 2022-10-28 DIAGNOSIS — J441 Chronic obstructive pulmonary disease with (acute) exacerbation: Secondary | ICD-10-CM | POA: Diagnosis not present

## 2022-10-28 DIAGNOSIS — J101 Influenza due to other identified influenza virus with other respiratory manifestations: Secondary | ICD-10-CM | POA: Diagnosis not present

## 2022-10-28 LAB — GLUCOSE, CAPILLARY
Glucose-Capillary: 80 mg/dL (ref 70–99)
Glucose-Capillary: 162 mg/dL — ABNORMAL HIGH (ref 70–99)

## 2022-10-28 MED ORDER — HEPARIN SOD (PORK) LOCK FLUSH 100 UNIT/ML IV SOLN
500.0000 [IU] | Freq: Once | INTRAVENOUS | Status: AC
  Administered 2022-10-28: 500 [IU] via INTRAVENOUS
  Filled 2022-10-28: qty 5

## 2022-10-28 MED ORDER — LIDOCAINE-PRILOCAINE 2.5-2.5 % EX CREA: 1.0000 | TOPICAL_CREAM | Freq: Once | CUTANEOUS | Status: AC

## 2022-10-28 MED ORDER — FUROSEMIDE 20 MG PO TABS
10.0000 mg | ORAL_TABLET | Freq: Every day | ORAL | Status: DC
  Administered 2022-10-28: 10 mg via ORAL
  Filled 2022-10-28: qty 1

## 2022-10-28 MED ORDER — MIDODRINE HCL 10 MG PO TABS: 10.0000 mg | ORAL_TABLET | Freq: Three times a day (TID) | ORAL | 0 refills | Status: DC

## 2022-10-28 NOTE — Progress Notes (Signed)
Received MD order to discharge patient to home with home health.  I reviewed discharge instructions, follow up appointments, prescriptions and home meds with patient and patient verbalized understanding.

## 2022-10-28 NOTE — Patient Outreach (Signed)
  Care Coordination   Follow Up Visit Note   10/28/2022 Name: Natasha [name] MRN: [id number] DOB: 1946-07-07  Natasha [name] is a 77 y.o. year old female who sees Natasha Lo, NP for primary care. I  daughter, Natasha [name].  What matters to the patients health and wellness today?  Patient remains in hospital at this time.  Ongoing care coordination follow up to assist with health care needs/ concerns.     Goals Addressed             This Visit's Progress    Patient / caregiver stated:  Management of health conditions       Care Coordination Interventions Evaluation of current treatment plan related to current hospitalization due to influenza and patient's adherence to plan as established by provider:  Daughter states patient is still in the hospital. She states patient is scheduled to have a thoracentesis.  Daughter states workers have installed ductless heating unit in home.  Home now has heat.                 SDOH assessments and interventions completed:  No     Care Coordination Interventions:  Yes, provided   Follow up plan: Follow up call scheduled for 11/02/22    Encounter Outcome:  Pt. Visit Completed   Quinn Plowman RN,BSN,CCM [place] [phone] direct line

## 2022-10-28 NOTE — Discharge Summary (Addendum)
Physician Discharge Summary   Patient: Natasha [name] MRN: [id number] DOB: May 06, 1946  Admit date:     10/20/2022  Discharge date: 10/28/22  Discharge Physician: Jennye Boroughs   PCP: [name], NP   Recommendations at discharge:   Follow-up with PCP or oncologist in 1 week  Discharge Diagnoses: Principal Problem:   Severe sepsis ([place]) Active Problems:   Acute on chronic respiratory failure with hypoxia and hypercarbia (HCC)   COPD with acute exacerbation (HCC)   Recurrent right pleural effusion   Influenza A   Bilateral pleural effusion   Acute on chronic diastolic CHF (congestive heart failure) (HCC)   Nodal marginal zone B-cell lymphoma (HCC)   Neutropenic fever (HCC)   AKI (acute kidney injury) ([place])   Type II diabetes mellitus with renal manifestations ([place])   Hypothyroidism   Overweight (BMI 25.0-29.9)   Other pancytopenia (HCC)   Anemia associated with chemotherapy  Resolved Problems:   * No resolved hospital problems. [facility] Course:  [name] is a 77 y.o. female with medical history significant for B-cell lymphoma, COPD, chronic hypoxic respiratory failure on 3-4 L/min oxygen via nasal cannula, chronic diastolic CHF, diabetes mellitus, who presented to the hospital with shortness of breath, fever and cough.  Oxygen saturation was 68% on room air.   She was admitted to the hospital for severe sepsis secondary to influenza A pneumonia.  She was treated with empiric IV antibiotics and Tamiflu.  She was also treated with steroids and bronchodilators for COPD exacerbation.  She initially required BiPAP for acute hypoxic respiratory failure.  She was found to have bilateral pleural effusion.  She underwent right-sided thoracentesis.  However, she declined left-sided thoracentesis.  She had pancytopenia.  She was treated with Granix for neutropenia and required 2 units of packed red blood cells for blood transfusion for severe anemia.  Overall, she feels  better and she wants to be discharged home today.  Left-sided thoracentesis was recommended.  However, patient does not want to have left thoracentesis.  She wants to be discharged home today.  She prefers to follow-up with her oncologist for further management..     Assessment and Plan:   Severe sepsis secondary to influenza A pneumonia: Completed Tamiflu   Acute on chronic hypoxic respiratory failure, chronic hypercapnic respiratory failure: Previously required BiPAP on admission.  She is on 4 L/min oxygen via nasal cannula.   COPD exacerbation: Continue bronchodilators   Recurrent right pleural effusion, moderate bilateral pleural effusion: S/p right-sided thoracentesis with removal of 1 L of amber fluid on 10/27/2022.  Recommended left-sided thoracentesis today.  However, patient declined.  She prefers to go home today.  She will follow-up with her oncologist.   Hypotension: Asymptomatic.  She said she has chronic hypotension.  Continue midodrine at discharge.   Acute on chronic diastolic CHF: Improved.  Continue oral Lasix  Bilateral lower extremity edema and tenderness: Venous duplex did not show any evidence of DVT.  Continue Lasix.   Pancytopenia: Anemia improved with 2 units of blood transfusion on 10/22/2022.  Neutropenia improved with Granix.   Stage IV nodal marginal zone lymphoma: Acalabrutinib has been held because of pneumonia.  Outpatient follow-up with oncologist.   Other comorbidities include type II DM, hypothyroidism        Consultants: Oncology Procedures performed: Right-sided thoracentesis Disposition: Home health Diet recommendation:  Discharge Diet Orders (From admission, onward)     Start     Ordered   10/28/22 0000  Diet -  low sodium heart healthy        10/28/22 1147           Cardiac diet DISCHARGE MEDICATION: Allergies as of 10/28/2022   No Known Allergies      Medication List     STOP taking these medications    nicotine 21  mg/24hr patch Commonly known as: NICODERM CQ - dosed in mg/24 hours       TAKE these medications    acyclovir 400 MG tablet Commonly known as: ZOVIRAX Take 400 mg by mouth daily.   albuterol 108 (90 Base) MCG/ACT inhaler Commonly known as: VENTOLIN HFA TAKE 2 PUFFS BY MOUTH EVERY 6 HOURS AS NEEDED FOR WHEEZE OR SHORTNESS OF BREATH What changed:  how much to take how to take this when to take this reasons to take this Another medication with the same name was removed. Continue taking this medication, and follow the directions you see here.   Calquence 100 MG tablet Generic drug: acalabrutinib maleate Take 1 tablet (100 mg total) by mouth 2 (two) times daily.   clobetasol cream 0.05 % Commonly known as: TEMOVATE Apply 1 Application topically daily.   furosemide 20 MG tablet Commonly known as: LASIX Take 1 tablet (20 mg total) by mouth daily.   insulin NPH-regular Human (70-30) 100 UNIT/ML injection Inject 5 Units into the skin 2 (two) times daily with a meal. Use only for hyperglycemia, CBG+ 300. Start with 5 units, titrate up to max 10 unit per dose if need.   ipratropium-albuterol 0.5-2.5 (3) MG/3ML Soln Commonly known as: DUONEB Take 3 mLs by nebulization every 6 (six) hours as needed (shortness of breath or wheezing).   levothyroxine 50 MCG tablet Commonly known as: SYNTHROID Take 1 tablet (50 mcg total) by mouth daily at 6 (six) AM.   lidocaine-prilocaine cream Commonly known as: EMLA Apply 1 Application topically once for 1 dose. Apply to mediport site 2 hours before infusion What changed:  how much to take how to take this when to take this additional instructions   loperamide 2 MG capsule Commonly known as: IMODIUM Take 2 mg by mouth as needed for diarrhea or loose stools.   midodrine 10 MG tablet Commonly known as: PROAMATINE Take 1 tablet (10 mg total) by mouth 3 (three) times daily with meals.   ondansetron 8 MG tablet Commonly known as:  ZOFRAN Take 8 mg by mouth every 8 (eight) hours as needed for nausea, vomiting or refractory nausea / vomiting. Take 1 tablet q6H for 4 days after chemotherapy.   prochlorperazine 10 MG tablet Commonly known as: COMPAZINE Take 1 tablet by mouth every 6 (six) hours as needed for nausea or vomiting. Take 1 tab q6H for 4 days after chemotherapy.   simethicone 80 MG chewable tablet Commonly known as: MYLICON Chew 297 mg by mouth every 6 (six) hours as needed for flatulence. Take 1 capsule PO PRN gas relief.         Discharge Exam: Filed Weights   10/22/22 0409 10/23/22 1203 10/24/22 1003  Weight: 63.5 kg 72.7 kg 67.4 kg   GEN: NAD SKIN: Warm and dry EYES: No pallor or icterus ENT: MMM CV: RRR PULM: Adequate air clear bilaterally.  Occasional wheezing at the lung bases. ABD: soft, ND, NT, +BS CNS: AAO x 3, non focal EXT: No edema or tenderness   Condition at discharge: good  The results of significant diagnostics from this hospitalization (including imaging, microbiology, ancillary and laboratory) are listed below for reference.  Imaging Studies: US THORACENTESIS ASP PLEURAL SPACE W/IMG GUIDE  Result Date: 10/28/2022 INDICATION: Right pleural effusion EXAM: ULTRASOUND GUIDED RIGHT THORACENTESIS MEDICATIONS: 10 cc 1% lidocaine COMPLICATIONS: None immediate. PROCEDURE: An ultrasound guided thoracentesis was thoroughly discussed with the patient and questions answered. The benefits, risks, alternatives and complications were also discussed. The patient understands and wishes to proceed with the procedure. Written consent was obtained. Ultrasound was performed to localize and mark an adequate pocket of fluid in the right chest. The area was then prepped and draped in the normal sterile fashion. 1% Lidocaine was used for local anesthesia. Under ultrasound guidance a 6 Fr Safe-T-Centesis catheter was introduced. Thoracentesis was performed. The catheter was removed and a dressing applied.  FINDINGS: A total of approximately 1.0 L of amber fluid was removed. Ordering provider did not request laboratory samples IMPRESSION: Successful ultrasound guided right thoracentesis yielding 1.0 L of pleural fluid. Follow-up chest x-ray revealed no evidence of pneumothorax. Read by: Reatha Armour, PA-C Electronically Signed   By: Albin Felling M.D.   On: 10/28/2022 08:05   US Venous Img Lower Bilateral (DVT)  Result Date: 10/27/2022 CLINICAL DATA:  Bilateral leg pain. EXAM: BILATERAL LOWER EXTREMITY VENOUS DOPPLER ULTRASOUND TECHNIQUE: Gray-scale sonography with graded compression, as well as color Doppler and duplex ultrasound were performed to evaluate the lower extremity deep venous systems from the level of the common femoral vein and including the common femoral, femoral, profunda femoral, popliteal and calf veins including the posterior tibial, peroneal and gastrocnemius veins when visible. The superficial great saphenous vein was also interrogated. Spectral Doppler was utilized to evaluate flow at rest and with distal augmentation maneuvers in the common femoral, femoral and popliteal veins. COMPARISON:  CT AP, 09/19/2022. FINDINGS: RIGHT LOWER EXTREMITY VENOUS Normal compressibility of the RIGHT common femoral, superficial femoral, and popliteal veins, as well as the visualized calf veins. Visualized portions of profunda femoral vein and great saphenous vein unremarkable. No filling defects to suggest DVT on grayscale or color Doppler imaging. Doppler waveforms show normal direction of venous flow, normal respiratory plasticity and response to augmentation. OTHER No evidence of superficial thrombophlebitis or abnormal fluid collection. Pathologically-enlarged RIGHT inguinal lymph nodes are imaged. Limitations: none LEFT LOWER EXTREMITY VENOUS Normal compressibility of the LEFT common femoral, superficial femoral, and popliteal veins, as well as the visualized calf veins. Visualized portions of profunda  femoral vein and great saphenous vein unremarkable. No filling defects to suggest DVT on grayscale or color Doppler imaging. Doppler waveforms show normal direction of venous flow, normal respiratory plasticity and response to augmentation. OTHER No evidence of superficial thrombophlebitis or abnormal fluid collection. Pathologically-enlarged LEFT inguinal lymph nodes are imaged. Limitations: none IMPRESSION: 1. No evidence of femoropopliteal DVT or superficial thrombophlebitis within either lower extremity. 2. Inguinal lymphadenopathy, best appreciated on most recent comparison CT and consistent with history of lymphoma. Michaelle Birks, MD [facility] Radiology Electronically Signed   By: Michaelle Birks M.D.   On: 10/27/2022 17:29   DG Chest Port 1 View  Result Date: 10/27/2022 CLINICAL DATA:  Post thoracentesis EXAM: PORTABLE CHEST 1 VIEW COMPARISON:  10/25/2022, CT 07/12/2022, chest x-ray 10/20/2022 FINDINGS: Right-sided central venous port tip over the SVC. Cardiomegaly with vascular congestion and probable edema. Moderate bilateral pleural effusions, increased on the left, decreased on the right. No right pneumothorax. Basilar airspace disease. IMPRESSION: 1. Moderate bilateral pleural effusions, increased on the left and decreased on the right. No pneumothorax. 2. Cardiomegaly with vascular congestion and probable edema.  Basilar airspace disease persists. Electronically Signed   By: Donavan Foil M.D.   On: 10/27/2022 16:28   [place] CHEST (PLEURAL EFFUSION)  Result Date: 10/26/2022 CLINICAL DATA:  77 year old female with pleural effusion presenting for thoracentesis. EXAM: CHEST ULTRASOUND COMPARISON:  10/25/2022 FINDINGS: Large, simple appearing right pleural effusion. IMPRESSION: Large right pleural effusion. No thoracentesis was performed at this time due to significant hypotension. The patient may return for thoracentesis once resolved. Ruthann Cancer, MD  [facility] Radiology Electronically Signed   By: Ruthann Cancer M.D.   On: 10/26/2022 10:13   DG Chest Port 1 View  Result Date: 10/25/2022 CLINICAL DATA:  Pleural effusion.  Productive cough. EXAM: PORTABLE CHEST 1 VIEW COMPARISON:  October 20, 2022 FINDINGS: Cardiomediastinal silhouette is stable. Stable right Port-A-Cath. No pneumothorax. Small left effusion with underlying atelectasis remains. A right pleural effusion with underlying opacity has increased. Diffuse bilateral pulmonary opacities are identified, worsened in the interval. No other interval changes. IMPRESSION: 1. Worsening right effusion with underlying opacity. Stable left effusion. 2. Diffuse bilateral pulmonary opacities may represent worsening pulmonary edema or a diffuse infectious process. Recommend clinical correlation. Electronically Signed   By: Dorise Bullion III M.D.   On: 10/25/2022 11:07   DG Chest Port 1 View  Result Date: 10/20/2022 CLINICAL DATA:  77 year old female with possible sepsis. EXAM: PORTABLE CHEST 1 VIEW COMPARISON:  Portable chest 09/20/2022 and earlier. FINDINGS: Portable AP semi upright view at 1019 hours. Ongoing moderate bilateral pleural effusions, not significantly changed. Right chest power port redemonstrated. Stable visible mediastinal contours. Partially calcified right lung nodule appears stable from a a chest CT 04/24/2022 (please see that report). Pulmonary vascularity appears stable since November. No pneumothorax. Paucity of bowel gas in the visible abdomen. Stable visualized osseous structures. IMPRESSION: 1. Ongoing moderate bilateral pleural effusions and pulmonary vascular congestion or mild edema, not significantly changed since November. 2. No new cardiopulmonary abnormality. Electronically Signed   By: Genevie Ann M.D.   On: 10/20/2022 10:31    Microbiology: Results for orders placed or performed during the hospital encounter of 10/20/22   Blood Culture (routine x 2)     Status: None   Collection Time: 10/20/22  9:45 AM   Specimen: BLOOD  Result Value Ref Range Status   Specimen Description BLOOD RIGHT ANTECUBITAL  Final   Special Requests   Final    BOTTLES DRAWN AEROBIC AND ANAEROBIC Blood Culture adequate volume   Culture   Final    NO GROWTH 5 DAYS Performed at [facility], [address] [place], [place] [address]    Report Status 10/25/2022 FINAL  Final  Blood Culture (routine x 2)     Status: None   Collection Time: 10/20/22  9:45 AM   Specimen: BLOOD  Result Value Ref Range Status   Specimen Description BLOOD BLOOD RIGHT FOREARM  Final   Special Requests   Final    BOTTLES DRAWN AEROBIC AND ANAEROBIC Blood Culture results may not be optimal due to an inadequate volume of blood received in culture bottles   Culture   Final    NO GROWTH 5 DAYS Performed at [facility], [address], [place], [place] [address]    Report Status 10/25/2022 FINAL  Final  Resp Panel by RT-PCR (Flu A&B, Covid) Anterior Nasal Swab     Status: Abnormal   Collection Time: 10/20/22  9:45 AM   Specimen: Anterior Nasal Swab  Result Value Ref Range Status  SARS Coronavirus 2 by RT PCR NEGATIVE NEGATIVE Final    Comment: (NOTE) SARS-CoV-2 target nucleic acids are NOT DETECTED.  The SARS-CoV-2 RNA is generally detectable in upper respiratory specimens during the acute phase of infection. The lowest concentration of SARS-CoV-2 viral copies this assay can detect is 138 copies/mL. A negative result does not preclude SARS-Cov-2 infection and should not be used as the sole basis for treatment or other patient management decisions. A negative result may occur with  improper specimen collection/handling, submission of specimen other than nasopharyngeal swab, presence of viral mutation(s) within the areas targeted by this assay, and inadequate number of viral copies(<138 copies/mL). A negative result must be combined  with clinical observations, patient history, and epidemiological information. The expected result is Negative.  Fact Sheet for Patients:  EntrepreneurPulse.com.au  Fact Sheet for Healthcare Providers:  IncredibleEmployment.be  This test is no t yet approved or cleared by the [place] FDA and  has been authorized for detection and/or diagnosis of SARS-CoV-2 by FDA under an Emergency Use Authorization (EUA). This EUA will remain  in effect (meaning this test can be used) for the duration of the COVID-19 declaration under Section 564(b)(1) of the Act, 21 U.S.C.section 360bbb-3(b)(1), unless the authorization is terminated  or revoked sooner.       Influenza A by PCR POSITIVE (A) NEGATIVE Final   Influenza B by PCR NEGATIVE NEGATIVE Final    Comment: (NOTE) The Xpert Xpress SARS-CoV-2/FLU/RSV plus assay is intended as an aid in the diagnosis of influenza from Nasopharyngeal swab specimens and should not be used as a sole basis for treatment. Nasal washings and aspirates are unacceptable for Xpert Xpress SARS-CoV-2/FLU/RSV testing.  Fact Sheet for Patients: EntrepreneurPulse.com.au  Fact Sheet for Healthcare Providers: IncredibleEmployment.be  This test is not yet approved or cleared by the [place] FDA and has been authorized for detection and/or diagnosis of SARS-CoV-2 by FDA under an Emergency Use Authorization (EUA). This EUA will remain in effect (meaning this test can be used) for the duration of the COVID-19 declaration under Section 564(b)(1) of the Act, 21 U.S.C. section 360bbb-3(b)(1), unless the authorization is terminated or revoked.  Performed at [facility], [address] [place], [place] [address]   Expectorated Sputum Assessment w Gram Stain, Rflx to Resp Cult     Status: None   Collection Time: 10/20/22  1:33 PM   Specimen: Sputum  Result Value Ref Range Status    Specimen Description SPUTUM  Final   Special Requests NONE  Final   Sputum evaluation   Final    THIS SPECIMEN IS ACCEPTABLE FOR SPUTUM CULTURE Performed at [facility], [address], [place], [place] [address]    Report Status 10/21/2022 FINAL  Final  Culture, Respiratory w Gram Stain     Status: None   Collection Time: 10/20/22  1:33 PM   Specimen: SPU  Result Value Ref Range Status   Specimen Description   Final    SPUTUM Performed at [facility], [address], [place], [place] [address]    Special Requests   Final    NONE Reflexed from [id number] Performed at [facility], [place], [place] [address]    Gram Stain   Final    RARE WBC PRESENT,BOTH PMN AND MONONUCLEAR NO ORGANISMS SEEN    Culture   Final    RARE Normal respiratory flora-no Staph aureus or Pseudomonas seen Performed at [facility], [place] [address],  [place], [place] [address]    Report Status 10/23/2022 FINAL  Final  MRSA Next Gen by PCR, Nasal     Status: None   Collection Time: 10/20/22  1:50 PM   Specimen: Nasal Mucosa; Nasal Swab  Result Value Ref Range Status   MRSA by PCR Next Gen NOT DETECTED NOT DETECTED Final    Comment: Performed at [facility], [address] [place], [place] [address]    Labs: CBC: Recent Labs  Lab 10/22/22 2039 10/25/22 0600  WBC  --  9.1  NEUTROABS  --  0.5*  HGB 8.7* 8.4*  HCT  --  28.3*  MCV  --  94.3  PLT  --  82*   Basic Metabolic Panel: Recent Labs  Lab 10/22/22 1313 10/23/22 0456  CREATININE 1.58* 1.74*   Liver Function Tests: No results for input(s): "AST", "ALT", "ALKPHOS", "BILITOT", "PROT", "ALBUMIN" in the last 168 hours. CBG: Recent Labs  Lab 10/27/22 1153 10/27/22 1718 10/27/22 2243 10/28/22 0805 10/28/22 1144  GLUCAP 179* 233* 75 80 162*    Discharge time spent: greater than 30 minutes.  Signed: Jennye Boroughs, MD [facility] 10/28/2022

## 2022-10-28 NOTE — Care Management Important Message (Signed)
Important Message  Patient Details  Name: Natasha [name] MRN: [id number] Date of Birth: November 14, 1945   Medicare Important Message Given:  Yes     Juliann Pulse A Vikrant Pryce 10/28/2022, 12:52 PM

## 2022-10-28 NOTE — Progress Notes (Cosign Needed)
     [place] REFERRAL        Occupational Therapy * Physical Therapy * Speech Therapy                           DATE  10/28/2022  PATIENT NAME Natasha [name]  PATIENT MRN [id number]       DIAGNOSIS/DIAGNOSIS CODE   A41.9, R65.20    DATE OF DISCHARGE: 10/28/2022       PRIMARY CARE PHYSICIAN      PCP PHONE/FAX    [name], NP Phone: [phone]       Dear Provider (Name: [facility] outpatient __  Fax: [phone]   I certify that I have examined this patient and that occupational/physical/speech therapy is necessary on an outpatient basis.    The patient has expressed interest in completing their recommended course of therapy at your  location.  Once a formal order from the patient's primary care physician has been obtained, please  contact him/her to schedule an appointment for evaluation at your earliest convenience.   '[X]'$   Physical Therapy Evaluate and Treat  '[X]'$   Occupational Therapy Evaluate and Treat  [  ]  Speech Therapy Evaluate and Treat         The patient's primary care physician (listed above) must furnish and be responsible for a formal order such that the recommended services may be furnished while under the primary physician's care, and that the plan of care will be established and reviewed every 30 days (or more often if condition necessitates).

## 2022-10-28 NOTE — Consult Note (Signed)
   [facility] Inpatient Consult   10/28/2022  Natasha [name] 26-Sep-1946 [id number]  [place] Organization [ACO] Patient: [employer] Medicare   Primary Care Provider:  [name], NP [facility]   Patient is currently active with [facility] for chronic disease management services.  Patient has been engaged by a [place] and LCSW.  Our community based plan of care has focused on disease management and community resource support.  Patient reviewed for potential transitioning back home.  Notes from [place] and LCSW notes that patient's home has heat now per daughter. Noted referral from [place] team member for [facility] Meals however at that time the patient declined the assistance.  Patient will receive a post hospital call and will be evaluated for assessments and disease process education.    Plan: [place] Coordination team aware of transition back to community and follow up with patient and daughter.  Of note, [facility] Care Management services does not replace or interfere with any services that are needed or arranged by inpatient [facility] care management team.   For additional questions or referrals please contact:  Natividad Brood, RN BSN [place]  [phone] business mobile phone Toll free office [phone]  *[place]  [phone] Fax number: [phone] [email or web address]

## 2022-10-29 ENCOUNTER — Telehealth: Payer: Self-pay | Admitting: *Deleted

## 2022-10-29 NOTE — Patient Outreach (Signed)
  Care Coordination TOC Note Transition Care Management Follow-up Telephone Call Date of discharge and from where: [id number] [facility] neutropenic fever How have you been since you were released from the hospital? Doing better, but patient had a fall. She has a skin tear but no other apparent injuries Any questions or concerns? No  Items Reviewed: Did the pt receive and understand the discharge instructions provided? Yes  Medications obtained and verified? Yes  Other? No  Any new allergies since your discharge? No  Dietary orders reviewed? Yes We were looking at mom's meals but daughter stated she was denied because on low carb but many foods not allowed with oncology.  Do you have support at home? Yes  Daughter is available more with patient care  Home Care and Equipment/Supplies: Were home health services ordered? yes If so, what is the name of the agency? [facility]   PT/OT Has the agency set up a time to come to the patient's home? No They have called but per daughter she missed the call and will call them back.  Were any new equipment or medical supplies ordered?  No What is the name of the medical supply agency? N  Were you able to get the supplies/equipment? not applicable Do you have any questions related to the use of the equipment or supplies? No  Functional Questionnaire: (I = Independent and D = Dependent) ADLs: D  Bathing/Dressing- D  Meal Prep- D  Eating- I  Maintaining continence- I  Transferring/Ambulation- D  Managing Meds- I  Follow up appointments reviewed:  PCP Hospital f/u appt confirmed? No  . [facility] f/u appt confirmed? Yes Dr Janese Banks oncology [phone] 2:15. Are transportation arrangements needed? No  If their condition worsens, is the pt aware to call PCP or go to the Emergency Dept.? Yes Was the patient provided with contact information for the PCP's office or ED? Yes Was to pt encouraged to call back with questions or concerns? Yes  SDOH  assessments and interventions completed:   Yes SDOH Interventions Today    Flowsheet Row Most Recent Value  SDOH Interventions   Housing Interventions Intervention Not Indicated       Care Coordination Interventions:  Patient is being followed by [employer] Social Worker and [employer]. RN discussed with daughter about looking into getting a medical alert system. RN gave daughter [facility] medical alert number to see in mom qualifies.     Encounter Outcome:  Pt. Visit Completed    [place] [facility] [phone]

## 2022-10-30 ENCOUNTER — Encounter: Payer: Self-pay | Admitting: Oncology

## 2022-11-01 ENCOUNTER — Telehealth: Payer: Self-pay

## 2022-11-01 ENCOUNTER — Telehealth: Payer: Self-pay | Admitting: *Deleted

## 2022-11-01 ENCOUNTER — Other Ambulatory Visit (HOSPITAL_COMMUNITY): Payer: Self-pay

## 2022-11-01 DIAGNOSIS — I5033 Acute on chronic diastolic (congestive) heart failure: Secondary | ICD-10-CM | POA: Diagnosis not present

## 2022-11-01 DIAGNOSIS — D509 Iron deficiency anemia, unspecified: Secondary | ICD-10-CM | POA: Diagnosis not present

## 2022-11-01 DIAGNOSIS — J9621 Acute and chronic respiratory failure with hypoxia: Secondary | ICD-10-CM | POA: Diagnosis not present

## 2022-11-01 DIAGNOSIS — C884 Extranodal marginal zone B-cell lymphoma of mucosa-associated lymphoid tissue [MALT-lymphoma]: Secondary | ICD-10-CM | POA: Diagnosis not present

## 2022-11-01 DIAGNOSIS — Z794 Long term (current) use of insulin: Secondary | ICD-10-CM | POA: Diagnosis not present

## 2022-11-01 DIAGNOSIS — A419 Sepsis, unspecified organism: Secondary | ICD-10-CM | POA: Diagnosis not present

## 2022-11-01 DIAGNOSIS — J9622 Acute and chronic respiratory failure with hypercapnia: Secondary | ICD-10-CM | POA: Diagnosis not present

## 2022-11-01 DIAGNOSIS — J90 Pleural effusion, not elsewhere classified: Secondary | ICD-10-CM | POA: Diagnosis not present

## 2022-11-01 DIAGNOSIS — I959 Hypotension, unspecified: Secondary | ICD-10-CM | POA: Diagnosis not present

## 2022-11-01 DIAGNOSIS — J441 Chronic obstructive pulmonary disease with (acute) exacerbation: Secondary | ICD-10-CM | POA: Diagnosis not present

## 2022-11-01 DIAGNOSIS — Z6823 Body mass index (BMI) 23.0-23.9, adult: Secondary | ICD-10-CM | POA: Diagnosis not present

## 2022-11-01 DIAGNOSIS — E119 Type 2 diabetes mellitus without complications: Secondary | ICD-10-CM | POA: Diagnosis not present

## 2022-11-01 DIAGNOSIS — D696 Thrombocytopenia, unspecified: Secondary | ICD-10-CM | POA: Diagnosis not present

## 2022-11-01 DIAGNOSIS — Z9181 History of falling: Secondary | ICD-10-CM | POA: Diagnosis not present

## 2022-11-01 DIAGNOSIS — D61818 Other pancytopenia: Secondary | ICD-10-CM | POA: Diagnosis not present

## 2022-11-01 DIAGNOSIS — Z87891 Personal history of nicotine dependence: Secondary | ICD-10-CM | POA: Diagnosis not present

## 2022-11-01 DIAGNOSIS — E039 Hypothyroidism, unspecified: Secondary | ICD-10-CM | POA: Diagnosis not present

## 2022-11-01 NOTE — Patient Outreach (Signed)
  Care Coordination   Collaboration phone  Visit Note   11/01/2022 Name: Natasha [name] MRN: [id number] DOB: 03/16/1946  Natasha [name] is a 77 y.o. year old female who sees [name], NP for primary care. I  spoke with PT from [place]  What matters to the patients health and wellness today?  In home care    Goals Addressed             This Visit's Progress    In home care needs       Care Coordination Interventions: Phone call from [place] with [place], OT, speech and a bath aid will be added-OT will request order for bath chair PT concerned about patient's hygiene(PT to order bath aid) and management of her toenails (PT to request referral to [place]) RNCM contacted and was a part of the call Per HH-PT, patient fell on Friday, no injury  Per HH-PT roommate [name] still in home, helping with medications-2 granddaughters in the home helping providing care Heating has been repaired Follow up phone call with patient to scheduled for 11/02/22 at 9am to confirm APS involvement         SDOH assessments and interventions completed:  No     Care Coordination Interventions:  Yes, provided   Follow up plan: Follow up call scheduled for 11/02/22    Encounter Outcome:  Pt. Visit Completed

## 2022-11-01 NOTE — Telephone Encounter (Signed)
Fax received from [facility] requesting information on this patient. Patient is no longer a patient her at our office, has established with a new PCP.   Called contact person on the fax, Ladon Applebaum, and LVM notifying her that patient is no longer under our care.

## 2022-11-01 NOTE — Patient Outreach (Unsigned)
  Care Coordination   Follow Up Visit Note   11/01/2022 Name: Natasha [name] MRN: [id number] DOB: 1946-02-20  Natasha [name] is a 77 y.o. year old female who sees [name], NP for primary care. I  spoke with Natasha [name], PT with [facility] and [place], LCSW.     Goals Addressed             This Visit's Progress    Patient / caregiver stated:  Management of health conditions       Care Coordination Interventions Collaboration with Angie, physical therapist with [place] and [place], LCSW regarding patients PT home health visit  Advised physical therapist to recommend home health aid for patient Advised to request podiatry referral for patient regarding foot care. Advised to request order from provider for shower chair.  Advised that patient / daughter to call her insurance plan regarding her OTC benefit.                SDOH assessments and interventions completed:  No{THN Tip this will not be part of the note when signed-REQUIRED REPORT FIELD DO NOT DELETE (Optional):27901}     Care Coordination Interventions:  Yes, provided {THN Tip this will not be part of the note when signed-REQUIRED REPORT FIELD DO NOT DELETE (Optional):27901}  Follow up plan: Follow up call scheduled for 11/02/22    Encounter Outcome:  Pt. Visit Completed {THN Tip this will not be part of the note when signed-REQUIRED REPORT FIELD DO NOT DELETE (Optional):27901}  Natasha Plowman RN,BSN,CCM [place] direct line

## 2022-11-02 ENCOUNTER — Ambulatory Visit: Payer: Self-pay | Admitting: *Deleted

## 2022-11-02 ENCOUNTER — Ambulatory Visit: Payer: Self-pay

## 2022-11-02 NOTE — Patient Outreach (Signed)
  Care Coordination   Follow Up Visit Note   11/02/2022 Name: Natasha [name] MRN: [id number] DOB: 05/10/1946  Natasha [name] is a 77 y.o. year old female who sees Natasha Lo, NP for primary care. I  daughter, Natasha [name].   What matters to the patients health and wellness today?  Ongoing management of health conditions and assistance with care coordination as needed.     Goals Addressed             This Visit's Progress    Patient / caregiver stated:  Management of health conditions       Care Coordination Interventions Evaluation of current treatment plan related to management of health conditions and patient's adherence to plan as established by provider:  Daughter states 2 granddaughters in home with patient with personal care aide experience assisting with patients care. She states patient denies any new or ongoing symptoms at this time.  Discussed with daughter per [facility] home health physical therapist, Natasha [name] referral will be made for OT eval, speech therapy,  home health aid, and shower chair as well as request to provider for Podiatry referral for toenail care.   Daughter advised to contact patients health insurance plan to determine OTC benefit allowance and/ or coverage for shower chair.  Discussed medications and discussed importance of compliance: Daughter states live in roommate Natasha [name] will assist patient with medications only.  Reviewed scheduled/upcoming provider appointments:  Per chart review patient is scheduled for oncology follow up and labs on 11/05/22.  Daughter states patient will remain at current PCP office and establish with new provider once available.  Discussed plans with patient for ongoing care management follow up. Daughter verbally agreed to next scheduled telephone follow up with RNCM on 12/13/22.  Discussed importance of patient wearing her oxygen around the clock as advised by provider.  Advised to contact provider for any mild to moderate  symptoms. Call 911 for severe symptoms.                 SDOH assessments and interventions completed:  No     Care Coordination Interventions:  Yes, provided   Follow up plan: Follow up call scheduled for 12/13/22    Encounter Outcome:  Pt. Visit Completed   Natasha Plowman RN,BSN,CCM [place] [phone] direct line

## 2022-11-02 NOTE — Patient Outreach (Signed)
  Care Coordination   Follow Up Visit Note   11/02/2022 Name: Natasha [name] MRN: [id number] DOB: June 29, 1946  Natasha [name] is a 77 y.o. year old female who sees Natasha Lo, NP for primary care. I spoke with  Natasha [name] daughter Natasha [name] by phone today.  What matters to the patients health and wellness today?  Ongoing in home care    Goals Addressed             This Visit's Progress    In home care needs       Care Coordination Interventions: Follow up phone call to patient, VM was full-patient's daughter contacted Recommendations from the [facility] PT discussed-patient's daughter will provide transport to Podiatrist if needed, patient also has medicaid transportation benefits as well Confirmed that [facility] will request order for  nursing, OT, speech and a bath aid OT will request order for bath chair Patient's daughter confirms that roommate Natasha [name] still in home, patient agreeable to help with medications only -2 granddaughters in the home helping providing care-daughter confirms that they are both personal care aids Heat has been repaired-per daughter Per daughter, she is not sure if the APS case is still open, however she believes that patient was assigned another APS worker during her last hospital visit Patient's daughter confirms having no additional needs at this time-this social worker will follow up within the next 30 days to continue to assess for community resource needs        SDOH assessments and interventions completed:  No     Care Coordination Interventions:  Yes, provided   Follow up plan: Follow up call scheduled for 11/23/22    Encounter Outcome:  Pt. Visit Completed

## 2022-11-02 NOTE — Patient Instructions (Signed)
Visit Information  Thank you for taking time to visit with me today. Please don't hesitate to contact me if I can be of assistance to you.   Following are the goals we discussed today:   Goals Addressed             This Visit's Progress    In home care needs       Care Coordination Interventions: Follow up phone call to patient, VM was full-patient's daughter contacted Recommendations from the [facility] PT discussed-patient's daughter will provide transport to Podiatrist if needed, patient also has medicaid transportation benefits as well Confirmed that [facility] will request order for  nursing, OT, speech and a bath aid OT will request order for bath chair Patient's daughter confirms that roommate [name] still in home, patient agreeable to help with medications only -2 granddaughters in the home helping providing care-daughter confirms that they are both personal care aids Heat has been repaired-per daughter Per daughter, she is not sure if the APS case is still open, however she believes that patient was assigned another APS worker during her last hospital visit Patient's daughter confirms having no additional needs at this time-this social worker will follow up within the next 30 days to continue to assess for community resource needs        Our next appointment is by telephone on 11/23/22 at 11am  Please call the care guide team at [phone] if you need to cancel or reschedule your appointment.   If you are experiencing a Mental Health or [place] or need someone to talk to, please call 911   Patient verbalizes understanding of instructions and care plan provided today and agrees to view in [place]. Active MyChart status and patient understanding of how to access instructions and care plan via MyChart confirmed with patient.     Telephone follow up appointment with care management team member scheduled for: 11/23/22  Elliot Gurney, [place] Worker   [facility] Care Management [phone]

## 2022-11-03 ENCOUNTER — Other Ambulatory Visit: Payer: Self-pay

## 2022-11-04 ENCOUNTER — Telehealth: Payer: Self-pay | Admitting: Nurse Practitioner

## 2022-11-04 ENCOUNTER — Other Ambulatory Visit: Payer: Self-pay | Admitting: *Deleted

## 2022-11-04 DIAGNOSIS — Z794 Long term (current) use of insulin: Secondary | ICD-10-CM | POA: Diagnosis not present

## 2022-11-04 DIAGNOSIS — D696 Thrombocytopenia, unspecified: Secondary | ICD-10-CM | POA: Diagnosis not present

## 2022-11-04 DIAGNOSIS — Z9181 History of falling: Secondary | ICD-10-CM | POA: Diagnosis not present

## 2022-11-04 DIAGNOSIS — C8580 Other specified types of non-Hodgkin lymphoma, unspecified site: Secondary | ICD-10-CM

## 2022-11-04 DIAGNOSIS — J90 Pleural effusion, not elsewhere classified: Secondary | ICD-10-CM | POA: Diagnosis not present

## 2022-11-04 DIAGNOSIS — D61818 Other pancytopenia: Secondary | ICD-10-CM | POA: Diagnosis not present

## 2022-11-04 DIAGNOSIS — D649 Anemia, unspecified: Secondary | ICD-10-CM

## 2022-11-04 DIAGNOSIS — D509 Iron deficiency anemia, unspecified: Secondary | ICD-10-CM | POA: Diagnosis not present

## 2022-11-04 DIAGNOSIS — E039 Hypothyroidism, unspecified: Secondary | ICD-10-CM | POA: Diagnosis not present

## 2022-11-04 DIAGNOSIS — E119 Type 2 diabetes mellitus without complications: Secondary | ICD-10-CM | POA: Diagnosis not present

## 2022-11-04 DIAGNOSIS — I5033 Acute on chronic diastolic (congestive) heart failure: Secondary | ICD-10-CM | POA: Diagnosis not present

## 2022-11-04 DIAGNOSIS — J441 Chronic obstructive pulmonary disease with (acute) exacerbation: Secondary | ICD-10-CM | POA: Diagnosis not present

## 2022-11-04 DIAGNOSIS — I959 Hypotension, unspecified: Secondary | ICD-10-CM | POA: Diagnosis not present

## 2022-11-04 DIAGNOSIS — A419 Sepsis, unspecified organism: Secondary | ICD-10-CM | POA: Diagnosis not present

## 2022-11-04 DIAGNOSIS — Z6823 Body mass index (BMI) 23.0-23.9, adult: Secondary | ICD-10-CM | POA: Diagnosis not present

## 2022-11-04 DIAGNOSIS — J9622 Acute and chronic respiratory failure with hypercapnia: Secondary | ICD-10-CM | POA: Diagnosis not present

## 2022-11-04 DIAGNOSIS — C884 Extranodal marginal zone B-cell lymphoma of mucosa-associated lymphoid tissue [MALT-lymphoma]: Secondary | ICD-10-CM | POA: Diagnosis not present

## 2022-11-04 DIAGNOSIS — J9621 Acute and chronic respiratory failure with hypoxia: Secondary | ICD-10-CM | POA: Diagnosis not present

## 2022-11-04 DIAGNOSIS — Z87891 Personal history of nicotine dependence: Secondary | ICD-10-CM | POA: Diagnosis not present

## 2022-11-04 NOTE — Telephone Encounter (Signed)
error 

## 2022-11-05 ENCOUNTER — Ambulatory Visit: Payer: Medicare Other | Admitting: *Deleted

## 2022-11-05 ENCOUNTER — Other Ambulatory Visit (HOSPITAL_COMMUNITY): Payer: Self-pay

## 2022-11-05 ENCOUNTER — Inpatient Hospital Stay: Payer: 59 | Attending: Oncology | Admitting: Oncology

## 2022-11-05 ENCOUNTER — Other Ambulatory Visit: Payer: Self-pay | Admitting: *Deleted

## 2022-11-05 ENCOUNTER — Encounter: Payer: Self-pay | Admitting: Oncology

## 2022-11-05 ENCOUNTER — Inpatient Hospital Stay: Payer: 59 | Admitting: Oncology

## 2022-11-05 ENCOUNTER — Inpatient Hospital Stay: Payer: 59

## 2022-11-05 VITALS — BP 94/59 | HR 85 | Temp 98.3°F | Resp 19 | Wt 153.0 lb

## 2022-11-05 DIAGNOSIS — K922 Gastrointestinal hemorrhage, unspecified: Secondary | ICD-10-CM | POA: Insufficient documentation

## 2022-11-05 DIAGNOSIS — M47814 Spondylosis without myelopathy or radiculopathy, thoracic region: Secondary | ICD-10-CM | POA: Insufficient documentation

## 2022-11-05 DIAGNOSIS — C8580 Other specified types of non-Hodgkin lymphoma, unspecified site: Secondary | ICD-10-CM

## 2022-11-05 DIAGNOSIS — R161 Splenomegaly, not elsewhere classified: Secondary | ICD-10-CM | POA: Diagnosis not present

## 2022-11-05 DIAGNOSIS — R0602 Shortness of breath: Secondary | ICD-10-CM | POA: Diagnosis not present

## 2022-11-05 DIAGNOSIS — D649 Anemia, unspecified: Secondary | ICD-10-CM | POA: Insufficient documentation

## 2022-11-05 DIAGNOSIS — C8300 Small cell B-cell lymphoma, unspecified site: Secondary | ICD-10-CM

## 2022-11-05 DIAGNOSIS — Z811 Family history of alcohol abuse and dependence: Secondary | ICD-10-CM | POA: Diagnosis not present

## 2022-11-05 DIAGNOSIS — J90 Pleural effusion, not elsewhere classified: Secondary | ICD-10-CM | POA: Insufficient documentation

## 2022-11-05 DIAGNOSIS — J449 Chronic obstructive pulmonary disease, unspecified: Secondary | ICD-10-CM | POA: Insufficient documentation

## 2022-11-05 DIAGNOSIS — Z7961 Long term (current) use of immunomodulator: Secondary | ICD-10-CM | POA: Diagnosis not present

## 2022-11-05 DIAGNOSIS — Z79899 Other long term (current) drug therapy: Secondary | ICD-10-CM | POA: Insufficient documentation

## 2022-11-05 DIAGNOSIS — Z87891 Personal history of nicotine dependence: Secondary | ICD-10-CM | POA: Diagnosis not present

## 2022-11-05 DIAGNOSIS — M5136 Other intervertebral disc degeneration, lumbar region: Secondary | ICD-10-CM | POA: Insufficient documentation

## 2022-11-05 DIAGNOSIS — I509 Heart failure, unspecified: Secondary | ICD-10-CM | POA: Insufficient documentation

## 2022-11-05 DIAGNOSIS — R5383 Other fatigue: Secondary | ICD-10-CM | POA: Insufficient documentation

## 2022-11-05 DIAGNOSIS — Z803 Family history of malignant neoplasm of breast: Secondary | ICD-10-CM | POA: Insufficient documentation

## 2022-11-05 DIAGNOSIS — M7989 Other specified soft tissue disorders: Secondary | ICD-10-CM | POA: Diagnosis not present

## 2022-11-05 DIAGNOSIS — C8308 Small cell B-cell lymphoma, lymph nodes of multiple sites: Secondary | ICD-10-CM | POA: Diagnosis not present

## 2022-11-05 DIAGNOSIS — F0390 Unspecified dementia without behavioral disturbance: Secondary | ICD-10-CM | POA: Diagnosis not present

## 2022-11-05 DIAGNOSIS — Z8249 Family history of ischemic heart disease and other diseases of the circulatory system: Secondary | ICD-10-CM | POA: Insufficient documentation

## 2022-11-05 DIAGNOSIS — E119 Type 2 diabetes mellitus without complications: Secondary | ICD-10-CM | POA: Insufficient documentation

## 2022-11-05 DIAGNOSIS — K8020 Calculus of gallbladder without cholecystitis without obstruction: Secondary | ICD-10-CM | POA: Diagnosis not present

## 2022-11-05 DIAGNOSIS — I959 Hypotension, unspecified: Secondary | ICD-10-CM | POA: Diagnosis not present

## 2022-11-05 DIAGNOSIS — M4856XA Collapsed vertebra, not elsewhere classified, lumbar region, initial encounter for fracture: Secondary | ICD-10-CM | POA: Insufficient documentation

## 2022-11-05 DIAGNOSIS — Z79620 Long term (current) use of immunosuppressive biologic: Secondary | ICD-10-CM | POA: Diagnosis not present

## 2022-11-05 DIAGNOSIS — J948 Other specified pleural conditions: Secondary | ICD-10-CM | POA: Diagnosis not present

## 2022-11-05 DIAGNOSIS — Z801 Family history of malignant neoplasm of trachea, bronchus and lung: Secondary | ICD-10-CM | POA: Insufficient documentation

## 2022-11-05 DIAGNOSIS — I2510 Atherosclerotic heart disease of native coronary artery without angina pectoris: Secondary | ICD-10-CM | POA: Diagnosis not present

## 2022-11-05 LAB — COMPREHENSIVE METABOLIC PANEL
ALT: 10 U/L (ref 0–44)
AST: 17 U/L (ref 15–41)
Albumin: 2.8 g/dL — ABNORMAL LOW (ref 3.5–5.0)
Alkaline Phosphatase: 159 U/L — ABNORMAL HIGH (ref 38–126)
Anion gap: 8 (ref 5–15)
BUN: 35 mg/dL — ABNORMAL HIGH (ref 8–23)
CO2: 31 mmol/L (ref 22–32)
Calcium: 8.7 mg/dL — ABNORMAL LOW (ref 8.9–10.3)
Chloride: 104 mmol/L (ref 98–111)
Creatinine, Ser: 0.79 mg/dL (ref 0.44–1.00)
GFR, Estimated: >60 mL/min (ref >60)
Glucose, Bld: 116 mg/dL — ABNORMAL HIGH (ref 70–99)
Potassium: 4.7 mmol/L (ref 3.5–5.1)
Sodium: 143 mmol/L (ref 135–145)
Total Bilirubin: 1.4 mg/dL — ABNORMAL HIGH (ref 0.3–1.2)
Total Protein: 4.7 g/dL — ABNORMAL LOW (ref 6.5–8.1)

## 2022-11-05 LAB — CBC WITH DIFFERENTIAL/PLATELET
Abs Immature Granulocytes: 0.25 10*3/uL — ABNORMAL HIGH (ref 0.00–0.07)
Basophils Absolute: 0 10*3/uL (ref 0.0–0.1)
Basophils Relative: 1 %
Eosinophils Absolute: 0.1 10*3/uL (ref 0.0–0.5)
Eosinophils Relative: 1 %
HCT: 26.2 % — ABNORMAL LOW (ref 36.0–46.0)
Hemoglobin: 7.7 g/dL — ABNORMAL LOW (ref 12.0–15.0)
Immature Granulocytes: 5 %
Lymphocytes Relative: 51 %
Lymphs Abs: 2.4 10*3/uL (ref 0.7–4.0)
MCH: 27.3 pg (ref 26.0–34.0)
MCHC: 29.4 g/dL — ABNORMAL LOW (ref 30.0–36.0)
MCV: 92.9 fL (ref 80.0–100.0)
Monocytes Absolute: 0.4 10*3/uL (ref 0.1–1.0)
Monocytes Relative: 8 %
Neutro Abs: 1.6 10*3/uL — ABNORMAL LOW (ref 1.7–7.7)
Neutrophils Relative %: 34 %
Platelets: 55 10*3/uL — ABNORMAL LOW (ref 150–400)
RBC: 2.82 MIL/uL — ABNORMAL LOW (ref 3.87–5.11)
RDW: 17.5 % — ABNORMAL HIGH (ref 11.5–15.5)
WBC: 4.7 10*3/uL (ref 4.0–10.5)
nRBC: 0 % (ref 0.0–0.2)

## 2022-11-05 LAB — SAMPLE TO BLOOD BANK

## 2022-11-05 MED ORDER — TENOFOVIR ALAFENAMIDE FUMARATE 25 MG PO TABS: 25.0000 mg | ORAL_TABLET | Freq: Every day | ORAL | 3 refills | Status: DC

## 2022-11-05 MED ORDER — OXYCODONE HCL 5 MG PO TABS: 5.0000 mg | ORAL_TABLET | ORAL | 0 refills | Status: DC

## 2022-11-05 NOTE — Progress Notes (Signed)
No new concerns today, low BP patient states always low, denies any symptoms

## 2022-11-05 NOTE — Progress Notes (Signed)
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     A cycle is every 28 days:     Rituximab-xxxx      Bendamustine   **Always confirm dose/schedule in your pharmacy ordering system**  REASON: Other Reason PRIOR TREATMENT: [id number]: Bendamustine + Rituximab IV (90/375) q28 Days x 4-6 Cycles  START ON PATHWAY REGIMEN - Lymphoma and CLL     A cycle is every 7 days:     Rituximab-xxxx   **Always confirm dose/schedule in your pharmacy ordering system**  Patient Characteristics: Marginal Zone Lymphoma, Systemic, First Line, Symptomatic Disease Type: Marginal Zone Lymphoma Disease Type: Not Applicable Disease Type: Not Applicable Localized or Systemic Disease<= Systemic Line of Therapy: First Line Asymptomatic or Symptomatic<= Symptomatic Intent of Therapy: Non-Curative / Palliative Intent, Discussed with Patient

## 2022-11-05 NOTE — Progress Notes (Signed)
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  Her most recent CBC from 06/22/2022 showed white count of 4.3, H&H of 8/26.6 with an MCV of 93 and a platelet count of 77.  Looking back at her prior CBCs her hemoglobin was normal at 13.3 until November 2020 and since June 2023 her hemoglobin has been fluctuating between 8.5-9.5.  Platelets were normal up until November 2020 but fluctuating between 70-80 since June 2023.  Her hemoglobin was normal at 13 in May 2021 as well and we do not have any labs between 2021 and 2023.  Patient states that she was diagnosed with a possible lymphoma a few years ago in [place] but was told that she does not require any treatment   Patient had a CT chest abdomen and pelvis with contrast which showed bulky bilateral axillary mediastinal and hilar adenopathy with the largest axillary node measuring 3.3 x 2.5 cm as compared to 1.7 cm back in 2020.  Severe splenomegaly of 21.9 cm.  Bulky intra-abdominal adenopathy.  Moderate right pleural effusion   Flow cytometry from peripheral blood that was done at [facility] showed clonal B cells which was CD5 negative CD10 negative CD103 negative and CD123 negative.  Differential diagnosis includes marginal zone lymphoma, lymphoplasmacytic lymphoma, DLBCL and atypical CLL.   Blood smear shows a small to medium lymphoid cells with irregular nuclear contours containing moderately condensed chromatin.  Large abnormal lymphocytes are rare.  Taken together these morphological and immunophenotypic findings favor the diagnosis of marginal zone lymphoma.   Patient had pleural fluid tapped as well and cytology was again consistent with involvement with patient's known B-cell lymphoma consistent with peripheral blood flow cytometry pathology.   Plan was to offer Bendamustine Rituxan chemotherapy for 6 cycles.  However patient had repeated episodes of hospitalization for respiratory failure and worsening performance status.  Plan was therefore made to proceed with acalabrutinib instead.  She started taking that on 08/31/2022    Interval history-patient is doing better since her hospital discharge.  She has chronic fatigue and exertional shortness of breath.  Reports being compliant with acalabrutinib.  ECOG PS- 2 Pain scale- 0   Review of systems- Review of Systems  Constitutional:  Positive for malaise/fatigue. Negative for chills, fever and weight loss.  HENT:  Negative for congestion, ear discharge and nosebleeds.   Eyes:  Negative for blurred vision.  Respiratory:  Positive for shortness of breath. Negative for cough, hemoptysis, sputum production and wheezing.   Cardiovascular:  Positive for leg swelling. Negative for chest pain, palpitations, orthopnea and claudication.  Gastrointestinal:  Negative for abdominal pain, blood in stool, constipation, diarrhea, heartburn, melena, nausea and vomiting.  Genitourinary:  Negative for dysuria, flank pain, frequency, hematuria and urgency.  Musculoskeletal:  Negative for back pain, joint pain and myalgias.  Skin:  Negative for rash.  Neurological:  Negative for dizziness, tingling, focal weakness,  seizures, weakness and headaches.  Endo/Heme/Allergies:  Does not bruise/bleed easily.  Psychiatric/Behavioral:  Negative for  depression and suicidal ideas. The patient does not have insomnia.       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   CHF (congestive heart failure) (HCC)    COPD (chronic obstructive pulmonary disease) (HCC)    Diabetes mellitus without complication (HCC)    Hypotension    Pleural effusion    ARMc 870m,  2 weeks ago   Vaginal delivery    x 5     Past Surgical History:  Procedure Laterality Date   IR IMAGING GUIDED PORT INSERTION  08/16/2022   TONSILLECTOMY Bilateral    as a child    Social History   Socioeconomic History   Marital status: Widowed    Spouse name: Not on file   Number of children: 2   Years of education: Not on file   Highest education level: 9th grade  Occupational History   Occupation: unemployed  Tobacco Use   Smoking status: Former    Packs/day: 2.00    Years: 55.00    Total pack years: 110.00    Types: Cigarettes    Start date: 07/07/1961    Quit date: 08/02/2022    Years since quitting: 0.2   Smokeless tobacco: Never   Tobacco comments:    1/2 pack she states but family says 2 PPD  Vaping Use   Vaping Use: Never used  Substance and Sexual Activity   Alcohol use: No   Drug use: Never   Sexual activity: Not Currently    Partners: Male    Birth control/protection: Post-menopausal  Other Topics Concern   Not on file  Social History Narrative   08/14/20   [place]  Living: to be near daughter   Work: retired      FPhysiological scientistchildren - JIT consultant(nearby) and son JEvelena Peat[place]in [place]  8 grandchildren, & 2 great-grandchildren.      Enjoys: stays at home      Exercise: walking to her daughter's store   Diet: eats fruit, air fried chicken      Safety   Seat belts: Yes    Guns: No   Safe in relationships: Yes    Social Determinants of Health   Financial Resource Strain: Medium Risk (08/24/2022)   Overall Financial Resource Strain (CARDIA)     Difficulty of Paying Living Expenses: Somewhat hard  Food Insecurity: No Food Insecurity (10/24/2022)   Hunger Vital Sign    Worried About Running Out of Food in the Last Year: Never true    Ran Out of Food in the Last Year: Never true  Recent Concern: Food Insecurity - Food Insecurity Present (08/30/2022)   Hunger Vital Sign    Worried About Running Out of Food in the Last Year: Sometimes true    Ran Out of Food in the Last Year: Sometimes true  Transportation Needs: No Transportation Needs (10/24/2022)   PRAPARE - THydrologist(Medical): No    Lack of Transportation (Non-Medical): No  Recent Concern: Transportation Needs - Unmet Transportation Needs (08/30/2022)   PRAPARE - THydrologist(Medical): Yes    Lack of Transportation (Non-Medical): No  Physical Activity: Inactive (08/24/2022)   Exercise Vital Sign    Days of Exercise  per Week: 0 days    Minutes of Exercise per Session: 0 min  Stress: Stress Concern Present (08/24/2022)   [place]    Feeling of Stress : To some extent  Social Connections: Socially Isolated (08/24/2022)   Social Connection and Isolation Panel [NHANES]    Frequency of Communication with Friends and Family: More than three times a week    Frequency of Social Gatherings with Friends and Family: More than three times a week    Attends Religious Services: Never    Marine scientist or Organizations: No    Attends Archivist Meetings: Never    Marital Status: Widowed  Intimate Partner Violence: Not At Risk (10/24/2022)   Humiliation, Afraid, Rape, and Kick questionnaire    Fear of Current or Ex-Partner: No    Emotionally Abused: No    Physically Abused: No    Sexually Abused: No    Family History  Problem Relation Age of Onset   Breast cancer Mother    Alzheimer's disease Father    Lung cancer Father        lung    Varicose Veins Brother    Heart attack Brother      Current Outpatient Medications:    acalabrutinib maleate (CALQUENCE) 100 MG tablet, Take 1 tablet (100 mg total) by mouth 2 (two) times daily., Disp: 60 tablet, Rfl: 1   acyclovir (ZOVIRAX) 400 MG tablet, Take 400 mg by mouth daily., Disp: , Rfl:    albuterol (VENTOLIN HFA) 108 (90 Base) MCG/ACT inhaler, TAKE 2 PUFFS BY MOUTH EVERY 6 HOURS AS NEEDED FOR WHEEZE OR SHORTNESS OF BREATH (Patient taking differently: Inhale 2 puffs into the lungs every 6 (six) hours as needed for wheezing or shortness of breath. TAKE 2 PUFFS BY MOUTH EVERY 6 HOURS AS NEEDED FOR WHEEZE OR SHORTNESS OF BREATH), Disp: 18 g, Rfl: 1   clobetasol cream (TEMOVATE) 1.51 %, Apply 1 Application topically daily., Disp: , Rfl:    furosemide (LASIX) 20 MG tablet, Take 1 tablet (20 mg total) by mouth daily., Disp: 90 tablet, Rfl: 3   insulin NPH-regular Human (70-30) 100 UNIT/ML injection, Inject 5 Units into the skin 2 (two) times daily with a meal. Use only for hyperglycemia, CBG+ 300. Start with 5 units, titrate up to max 10 unit per dose if need., Disp: 10 mL, Rfl: 0   ipratropium-albuterol (DUONEB) 0.5-2.5 (3) MG/3ML SOLN, Take 3 mLs by nebulization every 6 (six) hours as needed (shortness of breath or wheezing)., Disp: , Rfl:    levothyroxine (SYNTHROID) 50 MCG tablet, Take 1 tablet (50 mcg total) by mouth daily at 6 (six) AM., Disp: 30 tablet, Rfl: 0   loperamide (IMODIUM) 2 MG capsule, Take 2 mg by mouth as needed for diarrhea or loose stools., Disp: , Rfl:    midodrine (PROAMATINE) 10 MG tablet, Take 1 tablet (10 mg total) by mouth 3 (three) times daily with meals., Disp: 60 tablet, Rfl: 0   ondansetron (ZOFRAN) 8 MG tablet, Take 8 mg by mouth every 8 (eight) hours as needed for nausea, vomiting or refractory nausea / vomiting. Take 1 tablet q6H for 4 days after chemotherapy., Disp: , Rfl:    prochlorperazine (COMPAZINE) 10 MG tablet, Take 1 tablet by mouth every 6 (six)  hours as needed for nausea or vomiting. Take 1 tab q6H for 4 days after chemotherapy., Disp: , Rfl:    simethicone (MYLICON) 80 MG chewable tablet, Chew  180 mg by mouth every 6 (six) hours as needed for flatulence. Take 1 capsule PO PRN gas relief., Disp: , Rfl:    Tenofovir Alafenamide Fumarate 25 MG TABS, Take 1 tablet (25 mg total) by mouth daily., Disp: 30 tablet, Rfl: 3  Physical exam:  Vitals:   11/05/22 1150  BP: (!) 94/59  Pulse: 85  Resp: 19  Temp: 98.3 F (36.8 C)  SpO2: 100%  Weight: 153 lb (69.4 kg)   Physical Exam Cardiovascular:     Rate and Rhythm: Normal rate and regular rhythm.     Heart sounds: Normal heart sounds.  Pulmonary:     Effort: Pulmonary effort is normal.     Comments: Breath sounds decreased b/l at the bases. She is on long term O2 Abdominal:     Comments: Palpable splenomegaly  Musculoskeletal:     Right lower leg: Edema present.     Left lower leg: Edema present.  Lymphadenopathy:     Comments: Bilateral palpable axillary adenopathy  Skin:    General: Skin is warm and dry.  Neurological:     Mental Status: She is alert and oriented to person, place, and time.         Latest Ref Rng & Units 11/05/2022   11:13 AM  CMP  Glucose 70 - 99 mg/dL 116   BUN 8 - 23 mg/dL 35   Creatinine 0.44 - 1.00 mg/dL 0.79   Sodium 135 - 145 mmol/L 143   Potassium 3.5 - 5.1 mmol/L 4.7   Chloride 98 - 111 mmol/L 104   CO2 22 - 32 mmol/L 31   Calcium 8.9 - 10.3 mg/dL 8.7   Total Protein 6.5 - 8.1 g/dL 4.7   Total Bilirubin 0.3 - 1.2 mg/dL 1.4   Alkaline Phos 38 - 126 U/L 159   AST 15 - 41 U/L 17   ALT 0 - 44 U/L 10       Latest Ref Rng & Units 11/05/2022   11:13 AM  CBC  WBC 4.0 - 10.5 K/uL 4.7   Hemoglobin 12.0 - 15.0 g/dL 7.7   Hematocrit 36.0 - 46.0 % 26.2   Platelets 150 - 400 K/uL 55       US THORACENTESIS ASP PLEURAL SPACE W/IMG GUIDE  Result Date: 10/28/2022 INDICATION: Right pleural effusion EXAM: ULTRASOUND GUIDED RIGHT THORACENTESIS  MEDICATIONS: 10 cc 1% lidocaine COMPLICATIONS: None immediate. PROCEDURE: An ultrasound guided thoracentesis was thoroughly discussed with the patient and questions answered. The benefits, risks, alternatives and complications were also discussed. The patient understands and wishes to proceed with the procedure. Written consent was obtained. Ultrasound was performed to localize and mark an adequate pocket of fluid in the right chest. The area was then prepped and draped in the normal sterile fashion. 1% Lidocaine was used for local anesthesia. Under ultrasound guidance a 6 Fr Safe-T-Centesis catheter was introduced. Thoracentesis was performed. The catheter was removed and a dressing applied. FINDINGS: A total of approximately 1.0 L of amber fluid was removed. Ordering provider did not request laboratory samples IMPRESSION: Successful ultrasound guided right thoracentesis yielding 1.0 L of pleural fluid. Follow-up chest x-ray revealed no evidence of pneumothorax. Read by: Reatha Armour, PA-C Electronically Signed   By: Albin Felling M.D.   On: 10/28/2022 08:05   US Venous Img Lower Bilateral (DVT)  Result Date: 10/27/2022 CLINICAL DATA:  Bilateral leg pain. EXAM: BILATERAL LOWER EXTREMITY VENOUS DOPPLER ULTRASOUND TECHNIQUE: Gray-scale sonography with graded compression, as well as color Doppler and  duplex ultrasound were performed to evaluate the lower extremity deep venous systems from the level of the common femoral vein and including the common femoral, femoral, profunda femoral, popliteal and calf veins including the posterior tibial, peroneal and gastrocnemius veins when visible. The superficial great saphenous vein was also interrogated. Spectral Doppler was utilized to evaluate flow at rest and with distal augmentation maneuvers in the common femoral, femoral and popliteal veins. COMPARISON:  CT AP, 09/19/2022. FINDINGS: RIGHT LOWER EXTREMITY VENOUS Normal compressibility of the RIGHT common femoral,  superficial femoral, and popliteal veins, as well as the visualized calf veins. Visualized portions of profunda femoral vein and great saphenous vein unremarkable. No filling defects to suggest DVT on grayscale or color Doppler imaging. Doppler waveforms show normal direction of venous flow, normal respiratory plasticity and response to augmentation. OTHER No evidence of superficial thrombophlebitis or abnormal fluid collection. Pathologically-enlarged RIGHT inguinal lymph nodes are imaged. Limitations: none LEFT LOWER EXTREMITY VENOUS Normal compressibility of the LEFT common femoral, superficial femoral, and popliteal veins, as well as the visualized calf veins. Visualized portions of profunda femoral vein and great saphenous vein unremarkable. No filling defects to suggest DVT on grayscale or color Doppler imaging. Doppler waveforms show normal direction of venous flow, normal respiratory plasticity and response to augmentation. OTHER No evidence of superficial thrombophlebitis or abnormal fluid collection. Pathologically-enlarged LEFT inguinal lymph nodes are imaged. Limitations: none IMPRESSION: 1. No evidence of femoropopliteal DVT or superficial thrombophlebitis within either lower extremity. 2. Inguinal lymphadenopathy, best appreciated on most recent comparison CT and consistent with history of lymphoma. Michaelle Birks, MD [facility] Electronically Signed   By: Michaelle Birks M.D.   On: 10/27/2022 17:29   DG Chest Port 1 View  Result Date: 10/27/2022 CLINICAL DATA:  Post thoracentesis EXAM: PORTABLE CHEST 1 VIEW COMPARISON:  10/25/2022, CT 07/12/2022, chest x-ray 10/20/2022 FINDINGS: Right-sided central venous port tip over the SVC. Cardiomegaly with vascular congestion and probable edema. Moderate bilateral pleural effusions, increased on the left, decreased on the right. No right pneumothorax. Basilar airspace disease. IMPRESSION: 1. Moderate  bilateral pleural effusions, increased on the left and decreased on the right. No pneumothorax. 2. Cardiomegaly with vascular congestion and probable edema. Basilar airspace disease persists. Electronically Signed   By: Donavan Foil M.D.   On: 10/27/2022 16:28   [place] CHEST (PLEURAL EFFUSION)  Result Date: 10/26/2022 CLINICAL DATA:  77 year old female with pleural effusion presenting for thoracentesis. EXAM: CHEST ULTRASOUND COMPARISON:  10/25/2022 FINDINGS: Large, simple appearing right pleural effusion. IMPRESSION: Large right pleural effusion. No thoracentesis was performed at this time due to significant hypotension. The patient may return for thoracentesis once resolved. Ruthann Cancer, MD [facility] Electronically Signed   By: Ruthann Cancer M.D.   On: 10/26/2022 10:13   DG Chest Port 1 View  Result Date: 10/25/2022 CLINICAL DATA:  Pleural effusion.  Productive cough. EXAM: PORTABLE CHEST 1 VIEW COMPARISON:  October 20, 2022 FINDINGS: Cardiomediastinal silhouette is stable. Stable right Port-A-Cath. No pneumothorax. Small left effusion with underlying atelectasis remains. A right pleural effusion with underlying opacity has increased. Diffuse bilateral pulmonary opacities are identified, worsened in the interval. No other interval changes. IMPRESSION: 1. Worsening right effusion with underlying opacity. Stable left effusion. 2. Diffuse bilateral pulmonary opacities may represent worsening pulmonary edema or a diffuse infectious process. Recommend clinical correlation. Electronically Signed   By: Dorise Bullion III M.D.   On: 10/25/2022 11:07   DG Chest [facility]  1 View  Result Date: 10/20/2022 CLINICAL DATA:  77 year old female with possible sepsis. EXAM: PORTABLE CHEST 1 VIEW COMPARISON:  Portable chest 09/20/2022 and earlier. FINDINGS: Portable AP semi upright view at 1019 hours. Ongoing moderate bilateral pleural effusions, not significantly  changed. Right chest power port redemonstrated. Stable visible mediastinal contours. Partially calcified right lung nodule appears stable from a a chest CT 04/24/2022 (please see that report). Pulmonary vascularity appears stable since November. No pneumothorax. Paucity of bowel gas in the visible abdomen. Stable visualized osseous structures. IMPRESSION: 1. Ongoing moderate bilateral pleural effusions and pulmonary vascular congestion or mild edema, not significantly changed since November. 2. No new cardiopulmonary abnormality. Electronically Signed   By: Genevie Ann M.D.   On: 10/20/2022 10:31     Assessment and plan- Patient is a 77 y.o. female with history of stage IV nodal marginal zone lymphoma on acalabrutinib here for routine follow-up  Patient states that she has been compliant with acalabrutinib and has been taking it consistently for the last 3 months.  I am yet to see any meaningful decrease in her adenopathy or improvement in her cytopenias, pleural effusions and splenomegaly.  I am concerned that she has not had a meaningful response to acalabrutinib although in clinical trials overall response rate with BTK inhibitors is close to 80%.  Given her overall poor performance status and cytopenias I am concerned about using Bendamustine plus Rituxan given the infectious complications.  Moreover patient has had frequent hospitalizations for COVID as well as flu making her highly susceptible for these complications.  I am therefore planning to continue her acalabrutinib but add 4 weekly doses of Rituxan followed by consideration for maintenance Rituxan but hold off on Bendamustine.  Given the high burden of disease she is likely to have a high risk of infusion reaction with Rituxan.  I will therefore plan to premedicate her with steroids Benadryl and Singulair for 2 days prior to receiving her Rituxan and also give her Rituxan as a split dose over 2 days starting next week.  She will continue  acalabrutinib in the meanwhile.  We have been unable to get an outpatient PET CT scan because each time we try to schedule a scan she gets hospitalized for 1 reason or the other.  I will see her in 1 week's time to start dose 1 of Rituxan.  Discussed risks and benefits of Rituxan including all but not limited to infusion reactions, low blood counts and risk of infections.  Patient does have hepatitis B core antibody positive and therefore I would like her to start tenofovir for prophylaxis as well.  Hepatitis B surface antigen was negative thereby indicating that she does not have any active hepatitis B.  Treatment will be given with a palliative intent.  Patient comprehends my plan well.   Visit Diagnosis 1. Marginal zone lymphoma ([place])   2. Symptomatic anemia   3. Nodal marginal zone B-cell lymphoma (HCC)      Dr. Randa Evens, MD, MPH [facility] [phone] 11/05/2022 1:15 PM

## 2022-11-07 ENCOUNTER — Other Ambulatory Visit: Payer: Self-pay

## 2022-11-07 ENCOUNTER — Emergency Department: Payer: 59

## 2022-11-07 ENCOUNTER — Inpatient Hospital Stay: Payer: 59

## 2022-11-07 ENCOUNTER — Inpatient Hospital Stay
Admission: EM | Admit: 2022-11-07 | Discharge: 2022-11-12 | DRG: 377 | Disposition: A | Discharge status: Home Health | Payer: 59 | Attending: Internal Medicine | Admitting: Internal Medicine

## 2022-11-07 DIAGNOSIS — R578 Other shock: Secondary | ICD-10-CM | POA: Diagnosis present

## 2022-11-07 DIAGNOSIS — C8300 Small cell B-cell lymphoma, unspecified site: Secondary | ICD-10-CM | POA: Diagnosis not present

## 2022-11-07 DIAGNOSIS — Z87891 Personal history of nicotine dependence: Secondary | ICD-10-CM

## 2022-11-07 DIAGNOSIS — K922 Gastrointestinal hemorrhage, unspecified: Secondary | ICD-10-CM | POA: Diagnosis not present

## 2022-11-07 DIAGNOSIS — Z1152 Encounter for screening for COVID-19: Secondary | ICD-10-CM

## 2022-11-07 DIAGNOSIS — R1110 Vomiting, unspecified: Secondary | ICD-10-CM | POA: Diagnosis not present

## 2022-11-07 DIAGNOSIS — I5032 Chronic diastolic (congestive) heart failure: Secondary | ICD-10-CM | POA: Diagnosis present

## 2022-11-07 DIAGNOSIS — D696 Thrombocytopenia, unspecified: Secondary | ICD-10-CM | POA: Diagnosis present

## 2022-11-07 DIAGNOSIS — D630 Anemia in neoplastic disease: Secondary | ICD-10-CM | POA: Diagnosis present

## 2022-11-07 DIAGNOSIS — J910 Malignant pleural effusion: Secondary | ICD-10-CM | POA: Diagnosis present

## 2022-11-07 DIAGNOSIS — E1122 Type 2 diabetes mellitus with diabetic chronic kidney disease: Secondary | ICD-10-CM | POA: Diagnosis not present

## 2022-11-07 DIAGNOSIS — F0390 Unspecified dementia without behavioral disturbance: Secondary | ICD-10-CM | POA: Diagnosis present

## 2022-11-07 DIAGNOSIS — J432 Centrilobular emphysema: Secondary | ICD-10-CM | POA: Diagnosis not present

## 2022-11-07 DIAGNOSIS — Z66 Do not resuscitate: Secondary | ICD-10-CM | POA: Diagnosis not present

## 2022-11-07 DIAGNOSIS — E119 Type 2 diabetes mellitus without complications: Secondary | ICD-10-CM

## 2022-11-07 DIAGNOSIS — J101 Influenza due to other identified influenza virus with other respiratory manifestations: Secondary | ICD-10-CM | POA: Diagnosis present

## 2022-11-07 DIAGNOSIS — Z8249 Family history of ischemic heart disease and other diseases of the circulatory system: Secondary | ICD-10-CM | POA: Diagnosis not present

## 2022-11-07 DIAGNOSIS — Z79899 Other long term (current) drug therapy: Secondary | ICD-10-CM | POA: Diagnosis not present

## 2022-11-07 DIAGNOSIS — C8303 Small cell B-cell lymphoma, intra-abdominal lymph nodes: Secondary | ICD-10-CM | POA: Diagnosis present

## 2022-11-07 DIAGNOSIS — R6889 Other general symptoms and signs: Secondary | ICD-10-CM | POA: Diagnosis not present

## 2022-11-07 DIAGNOSIS — J90 Pleural effusion, not elsewhere classified: Secondary | ICD-10-CM | POA: Diagnosis present

## 2022-11-07 DIAGNOSIS — Z9221 Personal history of antineoplastic chemotherapy: Secondary | ICD-10-CM

## 2022-11-07 DIAGNOSIS — R58 Hemorrhage, not elsewhere classified: Secondary | ICD-10-CM | POA: Diagnosis not present

## 2022-11-07 DIAGNOSIS — Z9889 Other specified postprocedural states: Secondary | ICD-10-CM

## 2022-11-07 DIAGNOSIS — Z9981 Dependence on supplemental oxygen: Secondary | ICD-10-CM | POA: Diagnosis not present

## 2022-11-07 DIAGNOSIS — K8020 Calculus of gallbladder without cholecystitis without obstruction: Secondary | ICD-10-CM | POA: Diagnosis not present

## 2022-11-07 DIAGNOSIS — K921 Melena: Principal | ICD-10-CM | POA: Diagnosis present

## 2022-11-07 DIAGNOSIS — Z801 Family history of malignant neoplasm of trachea, bronchus and lung: Secondary | ICD-10-CM

## 2022-11-07 DIAGNOSIS — J1000 Influenza due to other identified influenza virus with unspecified type of pneumonia: Secondary | ICD-10-CM | POA: Diagnosis present

## 2022-11-07 DIAGNOSIS — J09X1 Influenza due to identified novel influenza A virus with pneumonia: Secondary | ICD-10-CM | POA: Diagnosis not present

## 2022-11-07 DIAGNOSIS — Z794 Long term (current) use of insulin: Secondary | ICD-10-CM | POA: Diagnosis not present

## 2022-11-07 DIAGNOSIS — A419 Sepsis, unspecified organism: Secondary | ICD-10-CM | POA: Diagnosis not present

## 2022-11-07 DIAGNOSIS — Z743 Need for continuous supervision: Secondary | ICD-10-CM | POA: Diagnosis not present

## 2022-11-07 DIAGNOSIS — J440 Chronic obstructive pulmonary disease with acute lower respiratory infection: Secondary | ICD-10-CM | POA: Diagnosis not present

## 2022-11-07 DIAGNOSIS — D62 Acute posthemorrhagic anemia: Secondary | ICD-10-CM

## 2022-11-07 DIAGNOSIS — Z803 Family history of malignant neoplasm of breast: Secondary | ICD-10-CM

## 2022-11-07 DIAGNOSIS — D6959 Other secondary thrombocytopenia: Secondary | ICD-10-CM | POA: Diagnosis not present

## 2022-11-07 DIAGNOSIS — K6389 Other specified diseases of intestine: Secondary | ICD-10-CM | POA: Diagnosis not present

## 2022-11-07 DIAGNOSIS — E039 Hypothyroidism, unspecified: Secondary | ICD-10-CM | POA: Diagnosis not present

## 2022-11-07 DIAGNOSIS — R0602 Shortness of breath: Secondary | ICD-10-CM | POA: Diagnosis not present

## 2022-11-07 DIAGNOSIS — Z515 Encounter for palliative care: Secondary | ICD-10-CM | POA: Diagnosis not present

## 2022-11-07 DIAGNOSIS — R739 Hyperglycemia, unspecified: Secondary | ICD-10-CM | POA: Diagnosis not present

## 2022-11-07 DIAGNOSIS — Z7189 Other specified counseling: Secondary | ICD-10-CM | POA: Diagnosis not present

## 2022-11-07 DIAGNOSIS — Z820 Family history of epilepsy and other diseases of the nervous system: Secondary | ICD-10-CM

## 2022-11-07 DIAGNOSIS — Z8572 Personal history of non-Hodgkin lymphomas: Secondary | ICD-10-CM | POA: Diagnosis not present

## 2022-11-07 DIAGNOSIS — R0600 Dyspnea, unspecified: Secondary | ICD-10-CM | POA: Diagnosis not present

## 2022-11-07 DIAGNOSIS — K625 Hemorrhage of anus and rectum: Secondary | ICD-10-CM | POA: Diagnosis not present

## 2022-11-07 DIAGNOSIS — Z79890 Hormone replacement therapy (postmenopausal): Secondary | ICD-10-CM

## 2022-11-07 DIAGNOSIS — R579 Shock, unspecified: Secondary | ICD-10-CM | POA: Diagnosis not present

## 2022-11-07 DIAGNOSIS — D61818 Other pancytopenia: Secondary | ICD-10-CM | POA: Diagnosis not present

## 2022-11-07 DIAGNOSIS — J9611 Chronic respiratory failure with hypoxia: Secondary | ICD-10-CM | POA: Diagnosis not present

## 2022-11-07 DIAGNOSIS — R0689 Other abnormalities of breathing: Secondary | ICD-10-CM | POA: Diagnosis not present

## 2022-11-07 LAB — PROCALCITONIN: Procalcitonin: 0.13 ng/mL

## 2022-11-07 LAB — URINALYSIS, ROUTINE W REFLEX MICROSCOPIC
Bilirubin Urine: NEGATIVE
Glucose, UA: NEGATIVE mg/dL
Hgb urine dipstick: NEGATIVE
Ketones, ur: NEGATIVE mg/dL
Leukocytes,Ua: NEGATIVE
Nitrite: NEGATIVE
Protein, ur: NEGATIVE mg/dL
Specific Gravity, Urine: 1.015 (ref 1.005–1.030)
pH: 5 (ref 5.0–8.0)

## 2022-11-07 LAB — CBC
HCT: 16.7 % — ABNORMAL LOW (ref 36.0–46.0)
HCT: 22.4 % — ABNORMAL LOW (ref 36.0–46.0)
Hemoglobin: 4.9 g/dL — CL (ref 12.0–15.0)
Hemoglobin: 7.1 g/dL — ABNORMAL LOW (ref 12.0–15.0)
MCH: 28.3 pg (ref 26.0–34.0)
MCH: 28.1 pg (ref 26.0–34.0)
MCHC: 29.3 g/dL — ABNORMAL LOW (ref 30.0–36.0)
MCHC: 31.7 g/dL (ref 30.0–36.0)
MCV: 96.5 fL (ref 80.0–100.0)
MCV: 88.5 fL (ref 80.0–100.0)
Platelets: 57 10*3/uL — ABNORMAL LOW (ref 150–400)
Platelets: 55 10*3/uL — ABNORMAL LOW (ref 150–400)
RBC: 1.73 MIL/uL — ABNORMAL LOW (ref 3.87–5.11)
RBC: 2.53 MIL/uL — ABNORMAL LOW (ref 3.87–5.11)
RDW: 18.4 % — ABNORMAL HIGH (ref 11.5–15.5)
RDW: 18.4 % — ABNORMAL HIGH (ref 11.5–15.5)
WBC: 6.8 10*3/uL (ref 4.0–10.5)
WBC: 9.4 10*3/uL (ref 4.0–10.5)
nRBC: 0.4 % — ABNORMAL HIGH (ref 0.0–0.2)
nRBC: 0.7 % — ABNORMAL HIGH (ref 0.0–0.2)

## 2022-11-07 LAB — COMPREHENSIVE METABOLIC PANEL
ALT: 11 U/L (ref 0–44)
AST: 20 U/L (ref 15–41)
Albumin: 2.1 g/dL — ABNORMAL LOW (ref 3.5–5.0)
Alkaline Phosphatase: 123 U/L (ref 38–126)
Anion gap: 8 (ref 5–15)
BUN: 56 mg/dL — ABNORMAL HIGH (ref 8–23)
CO2: 25 mmol/L (ref 22–32)
Calcium: 8.2 mg/dL — ABNORMAL LOW (ref 8.9–10.3)
Chloride: 104 mmol/L (ref 98–111)
Creatinine, Ser: 1.03 mg/dL — ABNORMAL HIGH (ref 0.44–1.00)
GFR, Estimated: 57 mL/min — ABNORMAL LOW (ref >60)
Glucose, Bld: 267 mg/dL — ABNORMAL HIGH (ref 70–99)
Potassium: 5.7 mmol/L — ABNORMAL HIGH (ref 3.5–5.1)
Sodium: 137 mmol/L (ref 135–145)
Total Bilirubin: 1.2 mg/dL (ref 0.3–1.2)
Total Protein: 3.7 g/dL — ABNORMAL LOW (ref 6.5–8.1)

## 2022-11-07 LAB — BASIC METABOLIC PANEL
Anion gap: 8 (ref 5–15)
BUN: 59 mg/dL — ABNORMAL HIGH (ref 8–23)
CO2: 27 mmol/L (ref 22–32)
Calcium: 8 mg/dL — ABNORMAL LOW (ref 8.9–10.3)
Chloride: 104 mmol/L (ref 98–111)
Creatinine, Ser: 1 mg/dL (ref 0.44–1.00)
GFR, Estimated: 59 mL/min — ABNORMAL LOW (ref >60)
Glucose, Bld: 137 mg/dL — ABNORMAL HIGH (ref 70–99)
Potassium: 5.1 mmol/L (ref 3.5–5.1)
Sodium: 139 mmol/L (ref 135–145)

## 2022-11-07 LAB — RESPIRATORY PANEL BY PCR

## 2022-11-07 LAB — RESP PANEL BY RT-PCR (RSV, FLU A&B, COVID)  RVPGX2
Influenza A by PCR: POSITIVE — AB
Influenza B by PCR: NEGATIVE
Resp Syncytial Virus by PCR: NEGATIVE
SARS Coronavirus 2 by RT PCR: NEGATIVE

## 2022-11-07 LAB — HEMOGLOBIN AND HEMATOCRIT, BLOOD
HCT: 19.5 % — ABNORMAL LOW (ref 36.0–46.0)
Hemoglobin: 6 g/dL — ABNORMAL LOW (ref 12.0–15.0)

## 2022-11-07 LAB — GLUCOSE, CAPILLARY
Glucose-Capillary: 124 mg/dL — ABNORMAL HIGH (ref 70–99)
Glucose-Capillary: 172 mg/dL — ABNORMAL HIGH (ref 70–99)
Glucose-Capillary: 207 mg/dL — ABNORMAL HIGH (ref 70–99)

## 2022-11-07 LAB — LACTIC ACID, PLASMA
Lactic Acid, Venous: 3.2 mmol/L (ref 0.5–1.9)
Lactic Acid, Venous: 1.7 mmol/L (ref 0.5–1.9)

## 2022-11-07 LAB — C-REACTIVE PROTEIN: CRP: 1.3 mg/dL — ABNORMAL HIGH (ref <1.0)

## 2022-11-07 LAB — TROPONIN I (HIGH SENSITIVITY)
Troponin I (High Sensitivity): 9 ng/L (ref <18)
Troponin I (High Sensitivity): 10 ng/L (ref <18)

## 2022-11-07 LAB — MRSA NEXT GEN BY PCR, NASAL: MRSA by PCR Next Gen: NOT DETECTED

## 2022-11-07 LAB — POC OCCULT BLOOD, ED

## 2022-11-07 LAB — PROTIME-INR
INR: 1.4 — ABNORMAL HIGH (ref 0.8–1.2)
Prothrombin Time: 17.3 seconds — ABNORMAL HIGH (ref 11.4–15.2)

## 2022-11-07 MED ORDER — SODIUM CHLORIDE 0.9% FLUSH
10.0000 mL | INTRAVENOUS | Status: DC | PRN
  Administered 2022-11-07 – 2022-11-12 (×2): 10 mL

## 2022-11-07 MED ORDER — POLYETHYLENE GLYCOL 3350 17 G PO PACK: 17.0000 g | PACK | Freq: Every day | ORAL | Status: DC | PRN

## 2022-11-07 MED ORDER — MORPHINE SULFATE (PF) 2 MG/ML IV SOLN
1.0000 mg | INTRAVENOUS | Status: DC | PRN
  Administered 2022-11-07 – 2022-11-09 (×4): 1 mg via INTRAVENOUS
  Filled 2022-11-07 (×4): qty 1

## 2022-11-07 MED ORDER — FUROSEMIDE 10 MG/ML IJ SOLN
20.0000 mg | Freq: Once | INTRAMUSCULAR | Status: AC
  Administered 2022-11-07: 20 mg via INTRAVENOUS
  Filled 2022-11-07: qty 2

## 2022-11-07 MED ORDER — SODIUM CHLORIDE 0.9 % IV SOLN
500.0000 mg | INTRAVENOUS | Status: DC
  Administered 2022-11-07 – 2022-11-09 (×3): 500 mg via INTRAVENOUS
  Filled 2022-11-07: qty 5
  Filled 2022-11-07: qty 500
  Filled 2022-11-07: qty 5
  Filled 2022-11-07: qty 500

## 2022-11-07 MED ORDER — NOREPINEPHRINE 4 MG/250ML-% IV SOLN
0.0000 ug/min | INTRAVENOUS | Status: DC
  Administered 2022-11-07: 2 ug/min via INTRAVENOUS
  Administered 2022-11-08: 6 ug/min via INTRAVENOUS
  Filled 2022-11-07 (×2): qty 250

## 2022-11-07 MED ORDER — SODIUM CHLORIDE 0.9 % IV SOLN
1.0000 g | INTRAVENOUS | Status: DC
  Administered 2022-11-07 – 2022-11-09 (×3): 1 g via INTRAVENOUS
  Filled 2022-11-07: qty 1
  Filled 2022-11-07 (×3): qty 10

## 2022-11-07 MED ORDER — OSELTAMIVIR PHOSPHATE 75 MG PO CAPS
75.0000 mg | ORAL_CAPSULE | Freq: Once | ORAL | Status: DC
  Filled 2022-11-07: qty 1

## 2022-11-07 MED ORDER — CHLORHEXIDINE GLUCONATE CLOTH 2 % EX PADS
6.0000 | MEDICATED_PAD | Freq: Every day | CUTANEOUS | Status: DC
  Administered 2022-11-07 – 2022-11-12 (×6): 6 via TOPICAL
  Filled 2022-11-07: qty 6

## 2022-11-07 MED ORDER — OSELTAMIVIR PHOSPHATE 30 MG PO CAPS
30.0000 mg | ORAL_CAPSULE | Freq: Two times a day (BID) | ORAL | Status: AC
  Administered 2022-11-07 – 2022-11-11 (×9): 30 mg via ORAL
  Filled 2022-11-07 (×9): qty 1

## 2022-11-07 MED ORDER — INSULIN ASPART 100 UNIT/ML IJ SOLN
0.0000 [IU] | INTRAMUSCULAR | Status: DC
  Administered 2022-11-07: 4 [IU] via SUBCUTANEOUS
  Administered 2022-11-08 (×2): 3 [IU] via SUBCUTANEOUS
  Filled 2022-11-07 (×3): qty 1

## 2022-11-07 MED ORDER — SODIUM CHLORIDE 0.9 % IV BOLUS
1000.0000 mL | Freq: Once | INTRAVENOUS | Status: AC
  Administered 2022-11-07: 1000 mL via INTRAVENOUS

## 2022-11-07 MED ORDER — PANTOPRAZOLE 80MG IVPB - SIMPLE MED
80.0000 mg | Freq: Once | INTRAVENOUS | Status: AC
  Administered 2022-11-07: 80 mg via INTRAVENOUS
  Filled 2022-11-07: qty 100

## 2022-11-07 MED ORDER — SODIUM CHLORIDE 0.9 % IV SOLN
1.0000 mg | Freq: Once | INTRAVENOUS | Status: AC
  Administered 2022-11-07: 1 mg via INTRAVENOUS
  Filled 2022-11-07: qty 0.2

## 2022-11-07 MED ORDER — THIAMINE HCL 100 MG/ML IJ SOLN
100.0000 mg | Freq: Every day | INTRAMUSCULAR | Status: DC
  Administered 2022-11-07 – 2022-11-12 (×6): 100 mg via INTRAVENOUS
  Filled 2022-11-07 (×6): qty 2

## 2022-11-07 MED ORDER — SODIUM CHLORIDE 0.9 % IV SOLN
10.0000 mL/h | Freq: Once | INTRAVENOUS | Status: AC
  Administered 2022-11-07: 10 mL/h via INTRAVENOUS

## 2022-11-07 MED ORDER — DOCUSATE SODIUM 100 MG PO CAPS: 100.0000 mg | ORAL_CAPSULE | Freq: Two times a day (BID) | ORAL | Status: DC | PRN

## 2022-11-07 MED ORDER — ORAL CARE MOUTH RINSE: 15.0000 mL | OROMUCOSAL | Status: DC | PRN

## 2022-11-07 MED ORDER — PANTOPRAZOLE SODIUM 40 MG IV SOLR
40.0000 mg | Freq: Two times a day (BID) | INTRAVENOUS | Status: DC
  Administered 2022-11-07 – 2022-11-12 (×10): 40 mg via INTRAVENOUS
  Filled 2022-11-07 (×10): qty 10

## 2022-11-07 MED ORDER — IOHEXOL 300 MG/ML  SOLN
100.0000 mL | Freq: Once | INTRAMUSCULAR | Status: AC | PRN
  Administered 2022-11-07: 100 mL via INTRAVENOUS

## 2022-11-07 MED ORDER — PANTOPRAZOLE INFUSION (NEW) - SIMPLE MED
8.0000 mg/h | INTRAVENOUS | Status: DC
  Administered 2022-11-07: 8 mg/h via INTRAVENOUS
  Filled 2022-11-07: qty 100

## 2022-11-07 MED ORDER — SODIUM CHLORIDE 0.9 % IV SOLN: INTRAVENOUS | Status: DC | PRN

## 2022-11-07 NOTE — ED Notes (Signed)
Advised nurse that patient has ready bed 

## 2022-11-07 NOTE — Progress Notes (Signed)
Large maroon stool clot like in appearance.

## 2022-11-07 NOTE — ED Notes (Signed)
Pt's blood is being warmed through [facility].

## 2022-11-07 NOTE — Consult Note (Signed)
Consultation  Referring Provider:  ED    Admit date: 11/07/2022 Consult date: 11/07/2022         Reason for Consultation:     Melena         HPI:   Natasha [name] is a 77 y.o. 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 She notes that after she got home she feel on her sacrum which required help from the fire rescue squad to get up and then today started having dark tarry bowel movements. She does not take blood thinners and initially denied NSAIDS but says that she was given NSAIDS during her recent hospitalization. Her base line hemoglobin is 7-8 and it was 4.9 today. She required a few units of pRBC's during her most recent hospitalization due to anemia but did not have overt GI bleeding at that time. She is requiring pressors but she does have chronic hypotension. 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 She endorses abdominal pain which isn't new for her and sacral pain from her fall. This is her eighth hospitalization in six months.  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   CHF (congestive heart failure) ([facility])    COPD (chronic obstructive pulmonary disease) ([facility])    Diabetes mellitus without complication ([facility])    Hypotension    Pleural effusion    [facility] 897m,  2 weeks ago   Vaginal delivery    x 5    Past Surgical History:  Procedure Laterality Date   IR IMAGING GUIDED PORT INSERTION  08/16/2022   TONSILLECTOMY Bilateral    as a child    Family History  Problem Relation Age of Onset   Breast cancer Mother    Alzheimer's disease Father    Lung cancer Father        lung   Varicose Veins Brother    Heart attack Brother      Social History   Tobacco Use   Smoking status: Former     Packs/day: 2.00    Years: 55.00    Total pack years: 110.00    Types: Cigarettes    Start date: 07/07/1961    Quit date: 08/02/2022    Years since quitting: 0.2   Smokeless tobacco: Never   Tobacco comments:    1/2 pack she states but family says 2 PPD  Vaping Use   Vaping Use: Never used  Substance Use Topics   Alcohol use: No   Drug use: Never    Prior to Admission medications   Medication Sig Start Date End Date Taking? Authorizing Provider  acalabrutinib maleate (CALQUENCE) 100 MG tablet Take 1 tablet (100 mg total) by mouth 2 (two) times daily. 08/30/22   RSindy Guadeloupe MD  acyclovir (ZOVIRAX) 400 MG tablet Take 400 mg by mouth daily. 08/09/22   [provider]  albuterol (VENTOLIN HFA) 108 (90 Base) MCG/ACT inhaler TAKE 2 PUFFS BY MOUTH EVERY 6 HOURS AS NEEDED FOR WHEEZE OR SHORTNESS OF BREATH Patient taking differently: Inhale 2 puffs into the lungs every 6 (six) hours as needed for wheezing or shortness of breath. TAKE 2 PUFFS BY MOUTH EVERY 6 HOURS AS NEEDED FOR WHEEZE OR SHORTNESS OF BREATH 06/17/22  Priscella Mann, Sudheer B, MD  clobetasol cream (TEMOVATE) 3.01 % Apply 1 Application topically daily. 08/25/22   [provider]  furosemide (LASIX) 20 MG tablet Take 1 tablet (20 mg total) by mouth daily. 09/09/22   Alisa Graff, FNP  insulin NPH-regular Human (70-30) 100 UNIT/ML injection Inject 5 Units into the skin 2 (two) times daily with a meal. Use only for hyperglycemia, CBG+ 300. Start with 5 units, titrate up to max 10 unit per dose if need. 09/21/22   Loletha Grayer, MD  ipratropium-albuterol (DUONEB) 0.5-2.5 (3) MG/3ML SOLN Take 3 mLs by nebulization every 6 (six) hours as needed (shortness of breath or wheezing). 08/22/22   [provider]  levothyroxine (SYNTHROID) 50 MCG tablet Take 1 tablet (50 mcg total) by mouth daily at 6 (six) AM. 07/17/22   Sharen Hones, MD  loperamide (IMODIUM) 2 MG capsule Take 2 mg by mouth as needed for diarrhea or  loose stools.    [provider]  midodrine (PROAMATINE) 10 MG tablet Take 1 tablet (10 mg total) by mouth 3 (three) times daily with meals. 10/28/22   Jennye Boroughs, MD  ondansetron (ZOFRAN) 8 MG tablet Take 8 mg by mouth every 8 (eight) hours as needed for nausea, vomiting or refractory nausea / vomiting. Take 1 tablet q6H for 4 days after chemotherapy. 08/09/22   [provider]  oxyCODONE (OXY IR/ROXICODONE) 5 MG immediate release tablet Take 1 tablet (5 mg total) by mouth as directed. Patient can take 1 tablet 1 hour before her each treatment port is used for for chemo/ fluid 11/05/22   Verlon Au, NP  prochlorperazine (COMPAZINE) 10 MG tablet Take 1 tablet by mouth every 6 (six) hours as needed for nausea or vomiting. Take 1 tab q6H for 4 days after chemotherapy. 08/09/22   [provider]  simethicone (MYLICON) 80 MG chewable tablet Chew 180 mg by mouth every 6 (six) hours as needed for flatulence. Take 1 capsule PO PRN gas relief.    [provider]  Tenofovir Alafenamide Fumarate 25 MG TABS Take 1 tablet (25 mg total) by mouth daily. 11/05/22   Sindy Guadeloupe, MD    Current Facility-Administered Medications  Medication Dose Route Frequency Provider Last Rate Last Admin   0.9 %  sodium chloride infusion  10 mL/hr Intravenous Once Arta Silence, MD       azithromycin (ZITHROMAX) 500 mg in sodium chloride 0.9 % 250 mL IVPB  500 mg Intravenous Q24H Ottie Glazier, MD       cefTRIAXone (ROCEPHIN) 1 g in sodium chloride 0.9 % 100 mL IVPB  1 g Intravenous Q24H Ottie Glazier, MD       Chlorhexidine Gluconate Cloth 2 % PADS 6 each  6 each Topical Daily Ottie Glazier, MD       docusate sodium (COLACE) capsule 100 mg  100 mg Oral BID PRN Ottie Glazier, MD       folic acid 1 mg in sodium chloride 0.9 % 50 mL IVPB  1 mg Intravenous Once Ottie Glazier, MD 100.4 mL/hr at 11/07/22 1630 1 mg at 11/07/22 1630   norepinephrine (LEVOPHED) '4mg'$  in 210m (0.016  mg/mL) premix infusion  0-40 mcg/min Intravenous Continuous SArta Silence MD 7.5 mL/hr at 11/07/22 1442 2 mcg/min at 11/07/22 1442   oseltamivir (TAMIFLU) capsule 30 mg  30 mg Oral BID AOttie Glazier MD       oseltamivir (TAMIFLU) capsule 75 mg  75 mg Oral Once AOttie Glazier MD  pantoprazole (PROTONIX) injection 40 mg  40 mg Intravenous Q12H Aleskerov, Luvenia Heller, MD       polyethylene glycol (MIRALAX / GLYCOLAX) packet 17 g  17 g Oral Daily PRN Ottie Glazier, MD       sodium chloride flush (NS) 0.9 % injection 10-40 mL  10-40 mL Intracatheter PRN Ottie Glazier, MD       thiamine (VITAMIN B1) injection 100 mg  100 mg Intravenous Daily Ottie Glazier, MD       Current Outpatient Medications  Medication Sig Dispense Refill   acalabrutinib maleate (CALQUENCE) 100 MG tablet Take 1 tablet (100 mg total) by mouth 2 (two) times daily. 60 tablet 1   acyclovir (ZOVIRAX) 400 MG tablet Take 400 mg by mouth daily.     albuterol (VENTOLIN HFA) 108 (90 Base) MCG/ACT inhaler TAKE 2 PUFFS BY MOUTH EVERY 6 HOURS AS NEEDED FOR WHEEZE OR SHORTNESS OF BREATH (Patient taking differently: Inhale 2 puffs into the lungs every 6 (six) hours as needed for wheezing or shortness of breath. TAKE 2 PUFFS BY MOUTH EVERY 6 HOURS AS NEEDED FOR WHEEZE OR SHORTNESS OF BREATH) 18 g 1   clobetasol cream (TEMOVATE) 8.46 % Apply 1 Application topically daily.     furosemide (LASIX) 20 MG tablet Take 1 tablet (20 mg total) by mouth daily. 90 tablet 3   insulin NPH-regular Human (70-30) 100 UNIT/ML injection Inject 5 Units into the skin 2 (two) times daily with a meal. Use only for hyperglycemia, CBG+ 300. Start with 5 units, titrate up to max 10 unit per dose if need. 10 mL 0   ipratropium-albuterol (DUONEB) 0.5-2.5 (3) MG/3ML SOLN Take 3 mLs by nebulization every 6 (six) hours as needed (shortness of breath or wheezing).     levothyroxine (SYNTHROID) 50 MCG tablet Take 1 tablet (50 mcg total) by mouth daily at 6 (six) AM.  30 tablet 0   loperamide (IMODIUM) 2 MG capsule Take 2 mg by mouth as needed for diarrhea or loose stools.     midodrine (PROAMATINE) 10 MG tablet Take 1 tablet (10 mg total) by mouth 3 (three) times daily with meals. 60 tablet 0   ondansetron (ZOFRAN) 8 MG tablet Take 8 mg by mouth every 8 (eight) hours as needed for nausea, vomiting or refractory nausea / vomiting. Take 1 tablet q6H for 4 days after chemotherapy.     oxyCODONE (OXY IR/ROXICODONE) 5 MG immediate release tablet Take 1 tablet (5 mg total) by mouth as directed. Patient can take 1 tablet 1 hour before her each treatment port is used for for chemo/ fluid 30 tablet 0   prochlorperazine (COMPAZINE) 10 MG tablet Take 1 tablet by mouth every 6 (six) hours as needed for nausea or vomiting. Take 1 tab q6H for 4 days after chemotherapy.     simethicone (MYLICON) 80 MG chewable tablet Chew 180 mg by mouth every 6 (six) hours as needed for flatulence. Take 1 capsule PO PRN gas relief.     Tenofovir Alafenamide Fumarate 25 MG TABS Take 1 tablet (25 mg total) by mouth daily. 30 tablet 3    Allergies as of 11/07/2022   (No Known Allergies)     Review of Systems:    All systems reviewed and negative except where noted in HPI.  Review of Systems  Constitutional:  Positive for malaise/fatigue. Negative for chills and fever.  Respiratory:  Positive for shortness of breath.   Cardiovascular:  Positive for leg swelling. Negative for chest pain.  Gastrointestinal:  Positive for abdominal pain and melena.  Musculoskeletal:  Positive for back pain and joint pain.  Skin:  Negative for rash.  Neurological:  Negative for focal weakness.  Psychiatric/Behavioral:  Negative for substance abuse.   All other systems reviewed and are negative.      Physical Exam:  Vital signs in last 24 hours: Temp:  [93.9 F (34.4 C)-97.8 F (36.6 C)] 97.8 F (36.6 C) (01/14 1630) Pulse Rate:  [84-103] 87 (01/14 1630) Resp:  [11-85] 15 (01/14 1630) BP:  (66-107)/(36-59) 106/59 (01/14 1630) SpO2:  [99 %-100 %] 100 % (01/14 1630)   General:   No acute distress Head:  Normocephalic and atraumatic. Eyes:   No icterus.   Conjunctiva pink. Mouth: dry mucous membranes Neck:  Supple Lungs:  Requires pursed lip breathing when speaking Abdomen:   Flat, soft, nondistended, nontender Rectal:  Melenic stool Msk:  2-3+ edema Neurologic:  No focal deficits Skin:  Warm, dry, pink without significant lesions or rashes. Psych:  Alert and cooperative. Normal affect.  LAB RESULTS: Recent Labs    11/05/22 1113 11/07/22 1253  WBC 4.7 6.8  HGB 7.7* 4.9*  HCT 26.2* 16.7*  PLT 55* 57*   BMET Recent Labs    11/05/22 1113 11/07/22 1253  NA 143 137  K 4.7 5.7*  CL 104 104  CO2 31 25  GLUCOSE 116* 267*  BUN 35* 56*  CREATININE 0.79 1.03*  CALCIUM 8.7* 8.2*   LFT Recent Labs    11/07/22 1253  PROT 3.7*  ALBUMIN 2.1*  AST 20  ALT 11  ALKPHOS 123  BILITOT 1.2   PT/INR Recent Labs    11/07/22 1254  LABPROT 17.3*  INR 1.4*    STUDIES: CT CHEST ABDOMEN PELVIS W CONTRAST  Result Date: 11/07/2022 CLINICAL DATA:  Sepsis. Dementia. Lymphoma. Hemorrhagic shock requiring blood transfusions and pressor support. * Tracking Code: BO * EXAM: CT CHEST, ABDOMEN, AND PELVIS WITH CONTRAST TECHNIQUE: Multidetector CT imaging of the chest, abdomen and pelvis was performed following the standard protocol during bolus administration of intravenous contrast. RADIATION DOSE REDUCTION: This exam was performed according to the departmental dose-optimization program which includes automated exposure control, adjustment of the mA and/or kV according to patient size and/or use of iterative reconstruction technique. CONTRAST:  143m OMNIPAQUE IOHEXOL 300 MG/ML  SOLN COMPARISON:  Chest radiograph from earlier today. 09/19/2022 CT abdomen/pelvis. 07/12/2022 chest CT angiogram. FINDINGS: CT CHEST FINDINGS Cardiovascular: Normal heart size. No significant pericardial  effusion/thickening. Left anterior descending coronary atherosclerosis. Right internal jugular Port-A-Cath terminates at the cavoatrial junction. Atherosclerotic nonaneurysmal thoracic aorta. Normal caliber pulmonary arteries. No central pulmonary emboli. Mediastinum/Nodes: No significant thyroid nodules. Unremarkable esophagus. Widespread mild to moderate bilateral axillary, bilateral lower neck, bilateral mediastinal and bilateral hilar lymphadenopathy, overall mildly decreased. Representative 1.6 cm right axillary node (series 3/image 23), decreased from 2.0 cm. Representative 1.7 cm right paratracheal node (series 3/image 19), mildly decreased from 1.9 cm. Representative 1.6 cm AP window node (series 3/image 20), stable. Lungs/Pleura: No pneumothorax. Moderate to large right and small to moderate left dependent pleural effusions with mild smooth bilateral pleural thickening and enhancement. New patchy consolidation in the posterior right upper lobe. New prominent patchy tree-in-bud opacities throughout the bilateral upper lobes. Calcified 1.9 cm posterior right upper lobe nodule is stable. Moderate compressive atelectasis in the posterior lower lobes bilaterally. Musculoskeletal: No aggressive appearing focal osseous lesions. Mild thoracic spondylosis. Mild anasarca. CT ABDOMEN PELVIS FINDINGS Hepatobiliary: Normal liver with no liver mass. Gallstones within the  contracted gallbladder with no definite gallbladder wall thickening. No biliary ductal dilatation. Pancreas: Normal, with no mass or duct dilation. Spleen: Marked splenomegaly measuring 21.5 cm craniocaudal length, previously 19.7 cm, mildly increased. No splenic masses. Adrenals/Urinary Tract: Normal adrenals. Normal kidneys with no hydronephrosis and no renal mass. Well-positioned Foley catheter within the bladder. Expected minimal gas in the nondependent bladder from instrumentation. Chronic mild diffuse bladder wall thickening. Stomach/Bowel: Normal  non-distended stomach. Normal caliber small bowel loops. Appendix not discretely visualized. Mild wall thickening in the nondilated terminal ileum and cecum in the right lower quadrant. No additional sites of small or large bowel wall thickening. No significant diverticulosis. Vascular/Lymphatic: Atherosclerotic nonaneurysmal abdominal aorta. Patent portal, splenic, hepatic and renal veins. Widespread moderate lymphadenopathy throughout the retroperitoneum and bilateral pelvis is overall slightly decreased from 09/19/2022 CT. Representative 2.3 cm portacaval node (series 3/image 63), decreased from 2.5 cm. Representative 1.7 cm left para-aortic node (series 3/image 69), decreased from 2.0 cm. Representative 1.8 cm right inguinal node (series 3/image 111), decreased from 2.0 cm. Reproductive: Grossly normal uterus.  No adnexal mass. Other: No pneumoperitoneum. No focal fluid collection. Moderate anasarca. Musculoskeletal: No aggressive appearing focal osseous lesions. Chronic L2 and L3 vertebral compression fractures. Mild-to-moderate lumbar degenerative disc disease. IMPRESSION: 1. Moderate to large right and small to moderate left dependent pleural effusions with mild smooth bilateral pleural thickening and enhancement. 2. New patchy consolidation in the posterior right upper lobe. New prominent patchy tree-in-bud opacities throughout the bilateral upper lobes. Findings are most compatible with multilobar bronchopneumonia. 3. Widespread lymphadenopathy in the chest, abdomen and pelvis is overall mildly decreased from prior CT imaging. 4. Marked splenomegaly, mildly increased. 5. Mild wall thickening in the nondilated terminal ileum and cecum in the right lower quadrant, nonspecific, cannot exclude third-spacing, ischemia or infectious enterocolitis. No evidence of bowel obstruction. No pneumatosis. No pneumoperitoneum. 6. Moderate anasarca. 7. Cholelithiasis. 8. Chronic mild diffuse bladder wall thickening,  nonspecific. 9.  Aortic Atherosclerosis (ICD10-I70.0). Electronically Signed   By: Ilona Sorrel M.D.   On: 11/07/2022 16:31   DG Chest Port 1 View  Result Date: 11/07/2022 CLINICAL DATA:  Dyspnea, vomiting, rectal bleeding, COPD, lymphoma EXAM: PORTABLE CHEST 1 VIEW COMPARISON:  10/27/2022 chest radiograph. FINDINGS: Stable right internal jugular Port-A-Cath terminating over the cavoatrial junction. Stable cardiomediastinal silhouette with mild cardiomegaly. No pneumothorax. Similar small bilateral pleural effusions. Moderate diffuse prominence of the parahilar interstitial markings with patchy bilateral parahilar and bibasilar lung opacities, worsened. IMPRESSION: 1. Mild cardiomegaly. Moderate diffuse prominence of the parahilar interstitial markings with patchy bilateral parahilar and bibasilar lung opacities, worsened, favor congestive heart failure with pulmonary edema. A component of aspiration or pneumonia at the lung bases is not excluded. 2. Similar small bilateral pleural effusions. Electronically Signed   By: Ilona Sorrel M.D.   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 Differential includes PUD versus malignant bleeding  - needs resuscitation but will tentatively plan for EGD tomorrow, will try for the morning - if has further hemodynamically significant bleeding overnight then would perform emergent EGD - transfuse to keep hemoglobin > 7 - maintain two large bore IV's - consider lasix between units if able based on blood pressure - agree with antibiotics - monitor platelets after transfusions  as she may need platelets due to hemodilution, would like platelets > 50 - she has chronic hypotension and was on midodrine before so this may need to  be restarted - patient is at extremely high risk for any intervention given comorbidities - given 8 hospitalizations in 6 months and her current disease processes, palliative care discussions should be continued. It looks like they have been attempted in the past but goals of care should be readdressed   Please call with any questions or concerns  Raylene Miyamoto MD, MPH [place]

## 2022-11-07 NOTE — H&P (Signed)
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  Patient coming in now with hemorrhagic shock requiring blood transfusion with requirement for Levophed.  Pulmonary critical care asked for admission due to shock physiology and acute blood loss anemia.  Lines/tubes : Urethral Catheter T. [place] Non-latex;Temperature probe 14 Fr. (Active)    Microbiology/Sepsis markers: Results for orders placed or performed during the hospital encounter of 11/07/22  Resp panel by RT-PCR (RSV, Flu A&B, Covid)     Status: Abnormal   Collection Time: 11/07/22 12:53 PM   Specimen: Nasal Swab  Result Value Ref Range Status   SARS Coronavirus 2 by RT PCR NEGATIVE NEGATIVE Final    Comment: (NOTE) SARS-CoV-2 target nucleic acids are NOT DETECTED.  The SARS-CoV-2 RNA is generally detectable in upper respiratory specimens during the acute phase of infection. The lowest concentration of SARS-CoV-2 viral copies this assay can detect is 138 copies/mL. A negative result does not preclude SARS-Cov-2 infection and should not be used as the sole basis for treatment or other patient management decisions. A negative result may occur with  improper specimen collection/handling, submission of specimen other than nasopharyngeal swab, presence of viral mutation(s) within the areas targeted by this assay, and inadequate number  of viral copies(<138 copies/mL). A negative result must be combined with clinical observations, patient history, and epidemiological information. The expected result is Negative.  Fact Sheet for Patients:  EntrepreneurPulse.com.au  Fact Sheet for Healthcare Providers:  IncredibleEmployment.be  This test is no t yet approved or cleared by the [place] FDA and  has been authorized for detection and/or diagnosis of SARS-CoV-2 by FDA under an Emergency Use Authorization (EUA). This EUA will remain  in effect (meaning this test can be used) for the duration of the COVID-19 declaration under Section 564(b)(1) of the Act, 21 U.S.C.section 360bbb-3(b)(1), unless the authorization is terminated  or revoked sooner.       Influenza A by PCR POSITIVE (A) NEGATIVE Final   Influenza B by PCR NEGATIVE NEGATIVE Final    Comment: (NOTE) The Xpert Xpress SARS-CoV-2/FLU/RSV plus assay is intended as an aid in the diagnosis of influenza from Nasopharyngeal swab specimens and should not be used as a sole basis for treatment. Nasal washings and aspirates are unacceptable for Xpert Xpress SARS-CoV-2/FLU/RSV testing.  Fact Sheet for Patients: EntrepreneurPulse.com.au  Fact Sheet for Healthcare Providers: IncredibleEmployment.be  This test is not yet approved or cleared by the [place] FDA and has been authorized for detection and/or diagnosis of SARS-CoV-2 by FDA under an Emergency Use Authorization (EUA). This EUA will remain in effect (meaning this test can be used) for the duration of the COVID-19 declaration under Section 564(b)(1) of the Act, 21 U.S.C. section 360bbb-3(b)(1), unless the authorization is terminated or revoked.     Resp Syncytial Virus by PCR NEGATIVE NEGATIVE Final    Comment: (NOTE) Fact Sheet for Patients: EntrepreneurPulse.com.au  Fact Sheet for Healthcare  Providers: IncredibleEmployment.be  This test is not yet approved or cleared by the [place] FDA and has been authorized for detection and/or diagnosis of SARS-CoV-2 by FDA under an Emergency Use Authorization (EUA).  This EUA will remain in effect (meaning this test can be used) for the duration of the COVID-19 declaration under Section 564(b)(1) of the Act, 21 U.S.C. section 360bbb-3(b)(1), unless the authorization is terminated or revoked.  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   CHF (congestive heart failure) ([facility])    COPD (chronic obstructive pulmonary disease) ([place])    Diabetes mellitus without complication ([facility])    Hypotension    Pleural effusion    [facility] 818m,  2 weeks ago   Vaginal delivery    x 5     SURGICAL HISTORY   Past Surgical History:  Procedure Laterality Date   IR IMAGING GUIDED PORT INSERTION  08/16/2022   TONSILLECTOMY Bilateral    as a child     FAMILY HISTORY   Family History  Problem Relation Age of Onset   Breast cancer Mother    Alzheimer's disease Father    Lung cancer Father        lung   Varicose Veins Brother    Heart attack Brother      SOCIAL HISTORY   Social History   Tobacco Use   Smoking status: Former    Packs/day: 2.00     Years: 55.00    Total pack years: 110.00    Types: Cigarettes    Start date: 07/07/1961    Quit date: 08/02/2022    Years since quitting: 0.2   Smokeless tobacco: Never   Tobacco comments:    1/2 pack she states but family says 2 PPD  Vaping Use   Vaping Use: Never used  Substance Use Topics   Alcohol use: No   Drug use: Never     MEDICATIONS   Current Medication:  Current Facility-Administered Medications:    0.9 %  sodium chloride infusion, 10 mL/hr, Intravenous, Once, SArta Silence MD   docusate sodium (COLACE) capsule 100 mg, 100 mg, Oral, BID PRN, AOttie Glazier MD   norepinephrine (LEVOPHED) '4mg'$  in 2551m(0.016 mg/mL) premix infusion, 0-40 mcg/min, Intravenous, Continuous, Siadecki, Sebastian, MD, Last Rate: 7.5 mL/hr at 11/07/22 1442, 2 mcg/min at 11/07/22 1442   pantoprozole (PROTONIX) 80 mg /NS 100 mL infusion, 8 mg/hr, Intravenous, Continuous, Siadecki, SeFelix AhmadiMD, Last Rate: 10 mL/hr at 11/07/22 1446, 8 mg/hr at 11/07/22 1446   polyethylene glycol (MIRALAX / GLYCOLAX) packet 17 g, 17 g, Oral, Daily PRN, AlOttie GlazierMD  Current Outpatient Medications:    acalabrutinib maleate (CALQUENCE) 100 MG tablet, Take 1 tablet (100 mg total) by mouth 2 (two) times daily., Disp: 60 tablet, Rfl: 1   acyclovir (ZOVIRAX) 400 MG tablet, Take 400 mg by mouth daily., Disp: , Rfl:    albuterol (VENTOLIN HFA) 108 (90 Base) MCG/ACT inhaler, TAKE 2 PUFFS BY MOUTH EVERY 6 HOURS AS  NEEDED FOR WHEEZE OR SHORTNESS OF BREATH (Patient taking differently: Inhale 2 puffs into the lungs every 6 (six) hours as needed for wheezing or shortness of breath. TAKE 2 PUFFS BY MOUTH EVERY 6 HOURS AS NEEDED FOR WHEEZE OR SHORTNESS OF BREATH), Disp: 18 g, Rfl: 1   clobetasol cream (TEMOVATE) 5.64 %, Apply 1 Application topically daily., Disp: , Rfl:    furosemide (LASIX) 20 MG tablet, Take 1 tablet (20 mg total) by mouth daily., Disp: 90 tablet, Rfl: 3   insulin NPH-regular Human (70-30) 100 UNIT/ML  injection, Inject 5 Units into the skin 2 (two) times daily with a meal. Use only for hyperglycemia, CBG+ 300. Start with 5 units, titrate up to max 10 unit per dose if need., Disp: 10 mL, Rfl: 0   ipratropium-albuterol (DUONEB) 0.5-2.5 (3) MG/3ML SOLN, Take 3 mLs by nebulization every 6 (six) hours as needed (shortness of breath or wheezing)., Disp: , Rfl:    levothyroxine (SYNTHROID) 50 MCG tablet, Take 1 tablet (50 mcg total) by mouth daily at 6 (six) AM., Disp: 30 tablet, Rfl: 0   loperamide (IMODIUM) 2 MG capsule, Take 2 mg by mouth as needed for diarrhea or loose stools., Disp: , Rfl:    midodrine (PROAMATINE) 10 MG tablet, Take 1 tablet (10 mg total) by mouth 3 (three) times daily with meals., Disp: 60 tablet, Rfl: 0   ondansetron (ZOFRAN) 8 MG tablet, Take 8 mg by mouth every 8 (eight) hours as needed for nausea, vomiting or refractory nausea / vomiting. Take 1 tablet q6H for 4 days after chemotherapy., Disp: , Rfl:    oxyCODONE (OXY IR/ROXICODONE) 5 MG immediate release tablet, Take 1 tablet (5 mg total) by mouth as directed. Patient can take 1 tablet 1 hour before her each treatment port is used for for chemo/ fluid, Disp: 30 tablet, Rfl: 0   prochlorperazine (COMPAZINE) 10 MG tablet, Take 1 tablet by mouth every 6 (six) hours as needed for nausea or vomiting. Take 1 tab q6H for 4 days after chemotherapy., Disp: , Rfl:    simethicone (MYLICON) 80 MG chewable tablet, Chew 180 mg by mouth every 6 (six) hours as needed for flatulence. Take 1 capsule PO PRN gas relief., Disp: , Rfl:    Tenofovir Alafenamide Fumarate 25 MG TABS, Take 1 tablet (25 mg total) by mouth daily., Disp: 30 tablet, Rfl: 3    ALLERGIES   Patient has no known allergies.    REVIEW OF SYSTEMS     10 point ROS conducted is negative except for back pain and weakness  PHYSICAL EXAMINATION   Vital Signs: Temp:  [93.9 F (34.4 C)-95.3 F (35.2 C)] 95.3 F (35.2 C) (01/14 1425) Pulse Rate:  [84-90] 89 (01/14  1425) Resp:  [11-85] 11 (01/14 1425) BP: (66-85)/(36-46) 85/44 (01/14 1420) SpO2:  [99 %-100 %] 100 % (01/14 1425)  GENERAL: Age-appropriate mild distress due to acutely ill status HEAD: Normocephalic, atraumatic.  EYES: Pupils equal, round, reactive to light.  No scleral icterus.  MOUTH: Moist mucosal membrane. NECK: Supple. No thyromegaly. No nodules. No JVD.  PULMONARY: Decreased breath sounds bilaterally CARDIOVASCULAR: S1 and S2. Regular rate and rhythm. No murmurs, rubs, or gallops.  GASTROINTESTINAL: Soft, nontender, non-distended. No masses. Positive bowel sounds. No hepatosplenomegaly.  MUSCULOSKELETAL: No swelling, clubbing, or edema.  NEUROLOGIC: Mild distress due to acute illness SKIN:intact,warm,dry   PERTINENT DATA     Infusions:  sodium chloride     norepinephrine (LEVOPHED) Adult infusion 2 mcg/min (11/07/22 1442)  pantoprazole 8 mg/hr (11/07/22 1446)   Scheduled Medications:  PRN Medications: docusate sodium, polyethylene glycol Hemodynamic parameters:   Intake/Output: No intake/output data recorded.  Ventilator  Settings:    LAB RESULTS:  Basic Metabolic Panel: Recent Labs  Lab 11/05/22 1113 11/07/22 1253  NA 143 137  K 4.7 5.7*  CL 104 104  CO2 31 25  GLUCOSE 116* 267*  BUN 35* 56*  CREATININE 0.79 1.03*  CALCIUM 8.7* 8.2*   Liver Function Tests: Recent Labs  Lab 11/05/22 1113 11/07/22 1253  AST 17 20  ALT 10 11  ALKPHOS 159* 123  BILITOT 1.4* 1.2  PROT 4.7* 3.7*  ALBUMIN 2.8* 2.1*   No results for input(s): "LIPASE", "AMYLASE" in the last 168 hours. No results for input(s): "AMMONIA" in the last 168 hours. CBC: Recent Labs  Lab 11/05/22 1113 11/07/22 1253  WBC 4.7 6.8  NEUTROABS 1.6*  --   HGB 7.7* 4.9*  HCT 26.2* 16.7*  MCV 92.9 96.5  PLT 55* 57*   Cardiac Enzymes: No results for input(s): "CKTOTAL", "CKMB", "CKMBINDEX", "TROPONINI" in the last 168 hours. BNP: Invalid input(s): "POCBNP" CBG: No results for  input(s): "GLUCAP" in the last 168 hours.     IMAGING RESULTS:  Imaging: DG Chest Port 1 View  Result Date: 11/07/2022 CLINICAL DATA:  Dyspnea, vomiting, rectal bleeding, COPD, lymphoma EXAM: PORTABLE CHEST 1 VIEW COMPARISON:  10/27/2022 chest radiograph. FINDINGS: Stable right internal jugular Port-A-Cath terminating over the cavoatrial junction. Stable cardiomediastinal silhouette with mild cardiomegaly. No pneumothorax. Similar small bilateral pleural effusions. Moderate diffuse prominence of the parahilar interstitial markings with patchy bilateral parahilar and bibasilar lung opacities, worsened. IMPRESSION: 1. Mild cardiomegaly. Moderate diffuse prominence of the parahilar interstitial markings with patchy bilateral parahilar and bibasilar lung opacities, worsened, favor congestive heart failure with pulmonary edema. A component of aspiration or pneumonia at the lung bases is not excluded. 2. Similar small bilateral pleural effusions. Electronically Signed   By: Ilona Sorrel M.D.   On: 11/07/2022 14:06   '@PROBHOSP'$ @ DG Chest Port 1 View  Result Date: 11/07/2022 CLINICAL DATA:  Dyspnea, vomiting, rectal bleeding, COPD, lymphoma EXAM: PORTABLE CHEST 1 VIEW COMPARISON:  10/27/2022 chest radiograph. FINDINGS: Stable right internal jugular Port-A-Cath terminating over the cavoatrial junction. Stable cardiomediastinal silhouette with mild cardiomegaly. No pneumothorax. Similar small bilateral pleural effusions. Moderate diffuse prominence of the parahilar interstitial markings with patchy bilateral parahilar and bibasilar lung opacities, worsened. IMPRESSION: 1. Mild cardiomegaly. Moderate diffuse prominence of the parahilar interstitial markings with patchy bilateral parahilar and bibasilar lung opacities, worsened, favor congestive heart failure with pulmonary edema. A component of aspiration or pneumonia at the lung bases is not excluded. 2. Similar small bilateral pleural effusions. Electronically  Signed   By: Ilona Sorrel M.D.   On: 11/07/2022 14:06       Resp panel by RT-PCR (RSV, Flu A&B, Covid) Order: [id number] Status: Final result     Visible to patient: Yes (seen)     Next appt: 11/12/2022 at 02:40 PM in Family Medicine [name], NP)   Specimen Information: Nasal Swab  0 Result Notes      Component Ref Range & Units 12:53 2 wk ago  SARS Coronavirus 2 by RT PCR NEGATIVE NEGATIVE NEGATIVE CM  Comment: (NOTE) SARS-CoV-2 target nucleic acids are NOT DETECTED.  The SARS-CoV-2 RNA is generally detectable in upper respiratory specimens during the acute phase of infection. The lowest concentration of SARS-CoV-2 viral copies this assay can detect is 138 copies/mL. A  negative result does not preclude SARS-Cov-2 infection and should not be used as the sole basis for treatment or other patient management decisions. A negative result may occur with improper specimen collection/handling, submission of specimen other than nasopharyngeal swab, presence of viral mutation(s) within the areas targeted by this assay, and inadequate number of viral copies(<138 copies/mL). A negative result must be combined with clinical observations, patient history, and epidemiological information. The expected result is Negative.  Fact Sheet for Patients: EntrepreneurPulse.com.au  Fact Sheet for Healthcare Providers: IncredibleEmployment.be  This test is not yet approved or cleared by the [place] FDA and has been authorized for detection and/or diagnosis of SARS-CoV-2 by FDA under an Emergency Use Authorization (EUA). This EUA will remain in effect (meaning this test can be used) for the duration of the COVID-19 declaration under Section 564(b)(1) of the Act, 21 U.S.C.section 360bbb-3(b)(1), unless the authorization is terminated or revoked sooner.    Influenza A by PCR NEGATIVE POSITIVE Abnormal  POSITIVE Abnormal   Influenza B by PCR  NEGATIVE NEGATIVE        ASSESSMENT AND PLAN    -Multidisciplinary rounds held today  Hemorrhagic shock    -Patient has CHF with 3+ lower extremity edema and fluid overload clinically, please use IV fluids judiciously.  Patient is status post Levophed infusion via right subclavian Port-A-Cath.  She is receiving blood transfusion during my evaluation.  Patient is communicative and does not requiring mechanical ventilation at this moment.   Acute blood loss anemia -s/p PRBC transfusion  -goal hB >8 -pending CT chest abdomen and pelvis to evaluate for cause of this due to cancer history.  Possible ischemic colitis vs malignancy related bleeding.  She previously had numerous enlarged lymph nodes on abdominal CT.  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  ID -continue IV abx as prescibed -follow up cultures  GI/Nutrition GI PROPHYLAXIS as indicated DIET-->TF's as tolerated Constipation protocol as indicated  ENDO - ICU hypoglycemic\Hyperglycemia protocol -check FSBS per protocol   ELECTROLYTES -follow labs as needed -replace as needed -pharmacy consultation   DVT/GI PRX ordered -SCDs  TRANSFUSIONS AS NEEDED MONITOR FSBS ASSESS the need for LABS as needed  Critical care provider statement:   Total critical care time: 33 minutes   Performed by: Lanney Gins MD   Critical care time was exclusive of separately billable procedures and treating other patients.   Critical care was necessary to treat or prevent  imminent or life-threatening deterioration.   Critical care was time spent personally by me on the following activities: development of treatment plan with patient and/or surrogate as well as nursing, discussions with consultants, evaluation of patient's response to treatment, examination of patient, obtaining history from patient or surrogate, ordering and performing treatments and interventions, ordering and review of laboratory studies, ordering and review of radiographic studies, pulse oximetry and re-evaluation of patient's condition.    Ottie Glazier, M.D.  Pulmonary & Critical Care Medicine

## 2022-11-07 NOTE — Plan of Care (Signed)
Patient admitted to ICU 11/07/22. Patient remains on supplemental O2 via [place] at 2 L/min. Patient remains on Levophed infusion via Chest Port. Patient is receiving 2/2 unit of PRBC at time of writing. Patient is still having bloody stools.  Addendum: 3rd unit of PRBC initiated at 0512 hours this AM.  Problem: Education: Goal: Knowledge of General Education information will improve Description: Including pain rating scale, medication(s)/side effects and non-pharmacologic comfort measures Outcome: Progressing   Problem: Health Behavior/Discharge Planning: Goal: Ability to manage health-related needs will improve Outcome: Progressing   Problem: Clinical Measurements: Goal: Ability to maintain clinical measurements within normal limits will improve Outcome: Progressing Goal: Will remain free from infection Outcome: Progressing Goal: Diagnostic test results will improve Outcome: Progressing Goal: Respiratory complications will improve Outcome: Progressing Goal: Cardiovascular complication will be avoided Outcome: Progressing   Problem: Activity: Goal: Risk for activity intolerance will decrease Outcome: Progressing   Problem: Nutrition: Goal: Adequate nutrition will be maintained Outcome: Progressing   Problem: Coping: Goal: Level of anxiety will decrease Outcome: Progressing   Problem: Elimination: Goal: Will not experience complications related to bowel motility Outcome: Progressing Goal: Will not experience complications related to urinary retention Outcome: Progressing   Problem: Pain Managment: Goal: General experience of comfort will improve Outcome: Progressing   Problem: Safety: Goal: Ability to remain free from injury will improve Outcome: Progressing   Problem: Skin Integrity: Goal: Risk for impaired skin integrity will decrease Outcome: Progressing   Problem: Education: Goal: Ability to describe self-care measures that may prevent or decrease complications  (Diabetes Survival Skills Education) will improve Outcome: Progressing Goal: Individualized Educational Video(s) Outcome: Progressing   Problem: Coping: Goal: Ability to adjust to condition or change in health will improve Outcome: Progressing   Problem: Fluid Volume: Goal: Ability to maintain a balanced intake and output will improve Outcome: Progressing   Problem: Health Behavior/Discharge Planning: Goal: Ability to identify and utilize available resources and services will improve Outcome: Progressing Goal: Ability to manage health-related needs will improve Outcome: Progressing   Problem: Metabolic: Goal: Ability to maintain appropriate glucose levels will improve Outcome: Progressing   Problem: Nutritional: Goal: Maintenance of adequate nutrition will improve Outcome: Progressing Goal: Progress toward achieving an optimal weight will improve Outcome: Progressing   Problem: Skin Integrity: Goal: Risk for impaired skin integrity will decrease Outcome: Progressing   Problem: Tissue Perfusion: Goal: Adequacy of tissue perfusion will improve Outcome: Progressing

## 2022-11-07 NOTE — ED Triage Notes (Signed)
Pt presents to the ED via [facility] due to bloody stools and coffee ground emesis. Per ems roommate found blood in the restroom after the patient left. Pt is on 4L [place] baseline. Pt is confused and appears pale.

## 2022-11-07 NOTE — ED Notes (Signed)
Pt's is bear hugger and ranger is being used to warm fluids.

## 2022-11-07 NOTE — Progress Notes (Signed)
Made Dr. Lanney Gins aware that patient is complaining of pain to buttocks where she fell at home. MD gave order for Morphine IV 1 mg q3H PRN pain.

## 2022-11-07 NOTE — ED Provider Notes (Signed)
[facility] Provider Note    Event Date/Time   First MD Initiated Contact with Patient 11/07/22 1247     (approximate)   History   Rectal Bleeding and Emesis   HPI  Maisey Deandrade Hach is a 77 y.o. female with a history of B-cell lymphoma, COPD, respiratory failure on 3 to 4 L home O2, diastolic CHF, and diabetes who presents with blood in the stool.  The patient states that she was having diarrhea and noticed that it was bloody and also dark.  Per EMS, the patient was found by the roommate at her facility in the bathroom.  EMS also reported that the patient had some coffee-ground emesis.  However, the patient denies any vomiting today.  She states she did not pass out.  She denies nausea currently.  She reports rectal and some lower abdominal pain.  She denies any fever or chills.  She is short of breath but states that this is her baseline and it is not any worse currently.  She denies any prior history of GI bleeding although states that she is chronically anemic.  I reviewed the past medical records.  The patient was admitted late last month.  Per the hospitalist discharge summary on 10/28/2022 she presented with shortness of breath, fever and cough.  She was diagnosed with sepsis due to influenza A and was treated with antibiotics and Tamiflu as well as steroids and bronchodilators.   Physical Exam   Triage Vital Signs: ED Triage Vitals [11/07/22 1245]  Enc Vitals Group     BP      Pulse      Resp      Temp      Temp src      SpO2 99 %     Weight      Height      Head Circumference      Peak Flow      Pain Score      Pain Loc      Pain Edu?      Excl. in [place]?     Most recent vital signs: Vitals:   11/07/22 1420 11/07/22 1425  BP: (!) 85/44   Pulse: 87 89  Resp: 19 11  Temp: (!) 95.3 F (35.2 C) (!) 95.3 F (35.2 C)  SpO2: 100% 100%     General: Alert and oriented, ill-appearing but in no acute distress. CV:  Good peripheral perfusion.   Normal heart sounds. Resp:  Increased respiratory or effort.  Diminished breath sounds bilaterally but no rales or wheezes. Abd:  Soft with mild bilateral lower quadrant discomfort.  No distention.  Other:  No visible external hemorrhoids or active hematochezia on rectal exam.  Dark red stool, guaiac positive.  Dry mucous membranes.   ED Results / Procedures / Treatments   Labs (all labs ordered are listed, but only abnormal results are displayed) Labs Reviewed  RESP PANEL BY RT-PCR (RSV, FLU A&B, COVID)  RVPGX2 - Abnormal; Notable for the following components:      Result Value   Influenza A by PCR POSITIVE (*)    All other components within normal limits  COMPREHENSIVE METABOLIC PANEL - Abnormal; Notable for the following components:   Potassium 5.7 (*)    Glucose, Bld 267 (*)    BUN 56 (*)    Creatinine, Ser 1.03 (*)    Calcium 8.2 (*)    Total Protein 3.7 (*)    Albumin 2.1 (*)  GFR, Estimated 57 (*)    All other components within normal limits  CBC - Abnormal; Notable for the following components:   RBC 1.73 (*)    Hemoglobin 4.9 (*)    HCT 16.7 (*)    MCHC 29.3 (*)    RDW 18.4 (*)    Platelets 57 (*)    nRBC 0.4 (*)    All other components within normal limits  LACTIC ACID, PLASMA - Abnormal; Notable for the following components:   Lactic Acid, Venous 3.2 (*)    All other components within normal limits  URINALYSIS, ROUTINE W REFLEX MICROSCOPIC - Abnormal; Notable for the following components:   Color, Urine YELLOW (*)    APPearance HAZY (*)    All other components within normal limits  PROTIME-INR - Abnormal; Notable for the following components:   Prothrombin Time 17.3 (*)    INR 1.4 (*)    All other components within normal limits  LACTIC ACID, PLASMA  POC OCCULT BLOOD, ED  TYPE AND SCREEN  PREPARE RBC (CROSSMATCH)  TROPONIN I (HIGH SENSITIVITY)  TROPONIN I (HIGH SENSITIVITY)     EKG  ED ECG REPORT I, Arta Silence, the attending physician,  personally viewed and interpreted this ECG.  Date: 11/07/2022 EKG Time: 1251 Rate: 90 Rhythm: normal sinus rhythm QRS Axis: normal Intervals: normal ST/T Wave abnormalities: normal Narrative Interpretation: no evidence of acute ischemia    RADIOLOGY  Chest x-ray: I independently viewed and interpreted the images; bilateral interstitial opacities consistent with pulmonary edema  PROCEDURES:  Critical Care performed: Yes, see critical care procedure note(s)  .Critical Care  Performed by: Arta Silence, MD Authorized by: Arta Silence, MD   Critical care provider statement:    Critical care time (minutes):  60   Critical care time was exclusive of:  Separately billable procedures and treating other patients   Critical care was necessary to treat or prevent imminent or life-threatening deterioration of the following conditions:  Shock, circulatory failure and respiratory failure   Critical care was time spent personally by me on the following activities:  Development of treatment plan with patient or surrogate, discussions with consultants, evaluation of patient's response to treatment, examination of patient, ordering and review of laboratory studies, ordering and review of radiographic studies, ordering and performing treatments and interventions, pulse oximetry, re-evaluation of patient's condition, review of old charts and obtaining history from patient or surrogate   Care discussed with: admitting provider      MEDICATIONS ORDERED IN ED: Medications  pantoprozole (PROTONIX) 80 mg /NS 100 mL infusion (8 mg/hr Intravenous Restarted 11/07/22 1446)  0.9 %  sodium chloride infusion (has no administration in time range)  norepinephrine (LEVOPHED) '4mg'$  in 217m (0.016 mg/mL) premix infusion (2 mcg/min Intravenous New Bag/Given 11/07/22 1442)  docusate sodium (COLACE) capsule 100 mg (has no administration in time range)  polyethylene glycol (MIRALAX / GLYCOLAX) packet 17 g  (has no administration in time range)  sodium chloride 0.9 % bolus 1,000 mL (0 mLs Intravenous Stopped 11/07/22 1410)  pantoprazole (PROTONIX) 80 mg /NS 100 mL IVPB (0 mg Intravenous Stopped 11/07/22 1410)     IMPRESSION / MDM / [place]/ ED COURSE  I reviewed the triage vital signs and the nursing notes.  77year old female with PMH as noted above but no known prior history of GI bleed presents with blood in the stool, acute onset today.  EMS reported coffee-ground emesis but the patient denies any vomiting.  On exam the patient is  hypotensive and hypothermic.  She demonstrates some tachypnea and increased work of breathing but there are no rales on lung exam.  On rectal exam, there is dark red/melanotic stool but no hematochezia.    Differential diagnosis includes, but is not limited to, upper GI bleed, possible PUD or gastritis, less likely lower GI bleed.  The patient has no history of liver cirrhosis or esophageal varices.  I have ordered fluid resuscitation, a Protonix infusion, and blood transfusion as well as GI consultation.  Although the patient is hypotensive given that there is no active hematochezia she does not require uncrossed blood or massive transfusion protocol.  We will obtain lab workup, chest x-ray, and reassess.  The patient will need admission.  Patient's presentation is most consistent with acute presentation with potential threat to life or bodily function.  The patient is on the cardiac monitor to evaluate for evidence of arrhythmia and/or significant heart rate changes.  ----------------------------------------- 1:41 PM on 11/07/2022 -----------------------------------------  Hemoglobin is 4.9.  The patient is hypotensive with a blood pressure is improving with the initial fluid bolus.  I called blood bank and confirmed emergent release of 2 units of PRBCs.   ----------------------------------------- 3:13 PM on  11/07/2022 -----------------------------------------  The patient is receiving blood.  Lab workup is significant for influenza A.  Lactate is elevated although I do not suspect sepsis at this time.  There is no leukocytosis or any symptoms to suggest a source of infection.  Chest x-ray shows bilateral interstitial opacities consistent with edema.  The patient has received a liter of saline and is receiving the blood now.  Given her CHF and the x-ray findings, I am concerned about giving additional fluid.  The patient remains hypotensive so I started her on a Levophed infusion.  I consulted Dr. Lanney Gins from the ICU; based on our discussion he agrees to admit the patient.  I also consulted Dr. Haig Prophet from GI.  He recommends a CT for further evaluation due to the patient's abdominal pain.    FINAL CLINICAL IMPRESSION(S) / ED DIAGNOSES   Final diagnoses:  Acute GI bleeding     Rx / DC Orders   ED Discharge Orders     None        Note:  This document was prepared using Dragon voice recognition software and may include unintentional dictation errors.    Arta Silence, MD 11/07/22 1515

## 2022-11-08 ENCOUNTER — Encounter: Payer: Self-pay | Admitting: Anesthesiology

## 2022-11-08 ENCOUNTER — Other Ambulatory Visit: Payer: Self-pay

## 2022-11-08 LAB — GLUCOSE, CAPILLARY
Glucose-Capillary: 112 mg/dL — ABNORMAL HIGH (ref 70–99)
Glucose-Capillary: 119 mg/dL — ABNORMAL HIGH (ref 70–99)
Glucose-Capillary: 138 mg/dL — ABNORMAL HIGH (ref 70–99)
Glucose-Capillary: 139 mg/dL — ABNORMAL HIGH (ref 70–99)
Glucose-Capillary: 156 mg/dL — ABNORMAL HIGH (ref 70–99)
Glucose-Capillary: 158 mg/dL — ABNORMAL HIGH (ref 70–99)
Glucose-Capillary: 184 mg/dL — ABNORMAL HIGH (ref 70–99)
Glucose-Capillary: 92 mg/dL (ref 70–99)

## 2022-11-08 LAB — PREPARE RBC (CROSSMATCH)

## 2022-11-08 LAB — CBC WITH DIFFERENTIAL/PLATELET
Abs Immature Granulocytes: 0.26 10*3/uL — ABNORMAL HIGH (ref 0.00–0.07)
Basophils Absolute: 0.1 10*3/uL (ref 0.0–0.1)
Basophils Relative: 1 %
Eosinophils Absolute: 0.1 10*3/uL (ref 0.0–0.5)
Eosinophils Relative: 1 %
HCT: 21.1 % — ABNORMAL LOW (ref 36.0–46.0)
Hemoglobin: 6.8 g/dL — ABNORMAL LOW (ref 12.0–15.0)
Immature Granulocytes: 3 %
Lymphocytes Relative: 62 %
Lymphs Abs: 5.1 10*3/uL — ABNORMAL HIGH (ref 0.7–4.0)
MCH: 28.6 pg (ref 26.0–34.0)
MCHC: 32.2 g/dL (ref 30.0–36.0)
MCV: 88.7 fL (ref 80.0–100.0)
Monocytes Absolute: 0.6 10*3/uL (ref 0.1–1.0)
Monocytes Relative: 7 %
Neutro Abs: 2.1 10*3/uL (ref 1.7–7.7)
Neutrophils Relative %: 26 %
Platelets: 64 10*3/uL — ABNORMAL LOW (ref 150–400)
RBC: 2.38 MIL/uL — ABNORMAL LOW (ref 3.87–5.11)
RDW: 19.4 % — ABNORMAL HIGH (ref 11.5–15.5)
WBC: 8.2 10*3/uL (ref 4.0–10.5)
nRBC: 1.2 % — ABNORMAL HIGH (ref 0.0–0.2)

## 2022-11-08 LAB — HEMOGLOBIN AND HEMATOCRIT, BLOOD
HCT: 20.7 % — ABNORMAL LOW (ref 36.0–46.0)
HCT: 24.3 % — ABNORMAL LOW (ref 36.0–46.0)
HCT: 23.8 % — ABNORMAL LOW (ref 36.0–46.0)
HCT: 22.5 % — ABNORMAL LOW (ref 36.0–46.0)
HCT: 21.8 % — ABNORMAL LOW (ref 36.0–46.0)
Hemoglobin: 6.7 g/dL — ABNORMAL LOW (ref 12.0–15.0)
Hemoglobin: 8.2 g/dL — ABNORMAL LOW (ref 12.0–15.0)
Hemoglobin: 7.9 g/dL — ABNORMAL LOW (ref 12.0–15.0)
Hemoglobin: 7.3 g/dL — ABNORMAL LOW (ref 12.0–15.0)
Hemoglobin: 7.1 g/dL — ABNORMAL LOW (ref 12.0–15.0)

## 2022-11-08 LAB — BASIC METABOLIC PANEL
Anion gap: 7 (ref 5–15)
BUN: 57 mg/dL — ABNORMAL HIGH (ref 8–23)
CO2: 29 mmol/L (ref 22–32)
Calcium: 8 mg/dL — ABNORMAL LOW (ref 8.9–10.3)
Chloride: 104 mmol/L (ref 98–111)
Creatinine, Ser: 1.07 mg/dL — ABNORMAL HIGH (ref 0.44–1.00)
GFR, Estimated: 55 mL/min — ABNORMAL LOW (ref >60)
Glucose, Bld: 126 mg/dL — ABNORMAL HIGH (ref 70–99)
Potassium: 4.6 mmol/L (ref 3.5–5.1)
Sodium: 140 mmol/L (ref 135–145)

## 2022-11-08 LAB — PHOSPHORUS: Phosphorus: 4.8 mg/dL — ABNORMAL HIGH (ref 2.5–4.6)

## 2022-11-08 LAB — MAGNESIUM: Magnesium: 1.5 mg/dL — ABNORMAL LOW (ref 1.7–2.4)

## 2022-11-08 LAB — PROCALCITONIN: Procalcitonin: 0.51 ng/mL

## 2022-11-08 MED ORDER — ACYCLOVIR 200 MG PO CAPS
400.0000 mg | ORAL_CAPSULE | Freq: Every day | ORAL | Status: DC
  Administered 2022-11-09 – 2022-11-12 (×4): 400 mg via ORAL
  Filled 2022-11-08 (×4): qty 2

## 2022-11-08 MED ORDER — MAGNESIUM SULFATE 2 GM/50ML IV SOLN
2.0000 g | Freq: Once | INTRAVENOUS | Status: AC
  Administered 2022-11-08: 2 g via INTRAVENOUS
  Filled 2022-11-08: qty 50

## 2022-11-08 MED ORDER — GUAIFENESIN 100 MG/5ML PO LIQD
5.0000 mL | ORAL | Status: DC | PRN
  Administered 2022-11-08 – 2022-11-09 (×2): 5 mL via ORAL
  Filled 2022-11-08 (×2): qty 10

## 2022-11-08 MED ORDER — SODIUM CHLORIDE 0.9% IV SOLUTION: Freq: Once | INTRAVENOUS | Status: AC

## 2022-11-08 MED ORDER — LEVOTHYROXINE SODIUM 50 MCG PO TABS
50.0000 ug | ORAL_TABLET | Freq: Every day | ORAL | Status: DC
  Administered 2022-11-09 – 2022-11-12 (×4): 50 ug via ORAL
  Filled 2022-11-08 (×4): qty 1

## 2022-11-08 MED ORDER — SODIUM CHLORIDE 0.9 % IV SOLN: 250.0000 mL | INTRAVENOUS | Status: DC

## 2022-11-08 MED ORDER — NOREPINEPHRINE 4 MG/250ML-% IV SOLN
2.0000 ug/min | INTRAVENOUS | Status: DC
  Administered 2022-11-08: 2 ug/min via INTRAVENOUS
  Filled 2022-11-08: qty 250

## 2022-11-08 NOTE — Progress Notes (Signed)
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  Patient coming in now with hemorrhagic shock requiring blood transfusion with requirement for Levophed.  Pulmonary critical care asked for admission due to shock physiology and acute blood loss anemia.  11/08/22- patient was unable to have GI procedure due to medical instability. She has poor prognosis overall, she relates she does not want to have intubation or CPR.  I will call palliative consultation.  There is plan for thoracentesis.   Lines/tubes : Urethral Catheter T. [place] Non-latex;Temperature probe 14 Fr. (Active)    Microbiology/Sepsis markers: Results for orders placed or performed during the hospital encounter of 11/07/22  Resp panel by RT-PCR (RSV, Flu A&B, Covid)     Status: Abnormal   Collection Time: 11/07/22 12:53 PM   Specimen: Nasal Swab  Result Value Ref Range Status   SARS Coronavirus 2 by RT PCR NEGATIVE NEGATIVE Final    Comment: (NOTE) SARS-CoV-2 target nucleic acids are NOT DETECTED.  The SARS-CoV-2 RNA is generally detectable in upper respiratory specimens during the acute phase of infection. The lowest concentration of SARS-CoV-2 viral copies this assay can detect is 138 copies/mL. A negative result does not preclude SARS-Cov-2 infection and should not be used as the sole basis for treatment or other patient  management decisions. A negative result may occur with  improper specimen collection/handling, submission of specimen other than nasopharyngeal swab, presence of viral mutation(s) within the areas targeted by this assay, and inadequate number of viral copies(<138 copies/mL). A negative result must be combined with clinical observations, patient history, and epidemiological information. The expected result is Negative.  Fact Sheet for Patients:  EntrepreneurPulse.com.au  Fact Sheet for Healthcare Providers:  IncredibleEmployment.be  This test is no t yet approved or cleared by the [place] FDA and  has been authorized for detection and/or diagnosis of SARS-CoV-2 by FDA under an Emergency Use Authorization (EUA). This EUA will remain  in effect (meaning this test can be used) for the duration of the COVID-19 declaration under Section 564(b)(1) of the Act, 21 U.S.C.section 360bbb-3(b)(1), unless the authorization is terminated  or revoked sooner.       Influenza A by PCR POSITIVE (A) NEGATIVE Final   Influenza B by PCR NEGATIVE NEGATIVE Final    Comment: (NOTE) The Xpert Xpress SARS-CoV-2/FLU/RSV plus assay is intended as an aid in the diagnosis of influenza from Nasopharyngeal swab specimens and should not be used as a sole basis for treatment. Nasal washings and aspirates are unacceptable for Xpert Xpress SARS-CoV-2/FLU/RSV testing.  Fact Sheet for Patients: EntrepreneurPulse.com.au  Fact Sheet for Healthcare Providers: IncredibleEmployment.be  This test is not yet approved or cleared by the [place] FDA and has been authorized for detection and/or diagnosis of SARS-CoV-2 by FDA under an Emergency Use Authorization (EUA). This EUA will remain in effect (meaning this test can be used) for the duration of the COVID-19 declaration under Section 564(b)(1) of the Act, 21 U.S.C. section  360bbb-3(b)(1), unless the authorization is terminated or revoked.     Resp Syncytial Virus by PCR NEGATIVE NEGATIVE Final    Comment: (NOTE) Fact Sheet  for Patients: EntrepreneurPulse.com.au  Fact Sheet for Healthcare Providers: IncredibleEmployment.be  This test is not yet approved or cleared by the [place] FDA and has been authorized for detection and/or diagnosis of SARS-CoV-2 by FDA under an Emergency Use Authorization (EUA). This EUA will remain in effect (meaning this test can be used) for the duration of the COVID-19 declaration under Section 564(b)(1) of the Act, 21 U.S.C. section 360bbb-3(b)(1), unless the authorization is terminated or revoked.  Performed at [facility], [address] [place], [place] [address]   Respiratory (~20 pathogens) panel by PCR     Status: Abnormal   Collection Time: 11/07/22 12:53 PM   Specimen: Nasopharyngeal Swab; Respiratory  Result Value Ref Range Status   Adenovirus NOT DETECTED NOT DETECTED Final   Coronavirus 229E NOT DETECTED NOT DETECTED Final    Comment: (NOTE) The Coronavirus on the Respiratory Panel, DOES NOT test for the novel  Coronavirus (2019 nCoV)    Coronavirus HKU1 NOT DETECTED NOT DETECTED Final   Coronavirus NL63 NOT DETECTED NOT DETECTED Final   Coronavirus OC43 NOT DETECTED NOT DETECTED Final   Metapneumovirus NOT DETECTED NOT DETECTED Final   Rhinovirus / Enterovirus NOT DETECTED NOT DETECTED Final   Influenza A H1 2009 DETECTED (A) NOT DETECTED Final   Influenza B NOT DETECTED NOT DETECTED Final   Parainfluenza Virus 1 NOT DETECTED NOT DETECTED Final   Parainfluenza Virus 2 NOT DETECTED NOT DETECTED Final   Parainfluenza Virus 3 NOT DETECTED NOT DETECTED Final   Parainfluenza Virus 4 NOT DETECTED NOT DETECTED Final   Respiratory Syncytial Virus NOT DETECTED NOT DETECTED Final   Bordetella pertussis NOT DETECTED NOT DETECTED Final   Bordetella Parapertussis  NOT DETECTED NOT DETECTED Final   Chlamydophila pneumoniae NOT DETECTED NOT DETECTED Final   Mycoplasma pneumoniae NOT DETECTED NOT DETECTED Final    Comment: Performed at [facility], [place]. [address], [place], [place] [address]  MRSA Next Gen by PCR, Nasal     Status: None   Collection Time: 11/07/22  5:17 PM   Specimen: Nasal Mucosa; Nasal Swab  Result Value Ref Range Status   MRSA by PCR Next Gen NOT DETECTED NOT DETECTED Final    Comment: (NOTE) The GeneXpert MRSA Assay (FDA approved for NASAL specimens only), is one component of a comprehensive MRSA colonization surveillance program. It is not intended to diagnose MRSA infection nor to guide or monitor treatment for MRSA infections. Test performance is not FDA approved in patients less than 23 years old. Performed at [facility], [address], [place], [place] [address]     Anti-infectives:  Anti-infectives (From admission, onward)    Start     Dose/Rate Route Frequency Ordered Stop   11/07/22 2200  oseltamivir (TAMIFLU) capsule 30 mg        30 mg Oral 2 times daily 11/07/22 1531 11/12/22 0959   11/07/22 1545  oseltamivir (TAMIFLU) capsule 75 mg        75 mg Oral  Once 11/07/22 1542     11/07/22 1530  cefTRIAXone (ROCEPHIN) 1 g in sodium chloride 0.9 % 100 mL IVPB        1 g 200 mL/hr over 30 Minutes Intravenous Every 24 hours 11/07/22 1529     11/07/22 1530  azithromycin (ZITHROMAX) 500 mg in sodium chloride 0.9 % 250 mL IVPB        500 mg 250 mL/hr over 60 Minutes Intravenous Every 24 hours 11/07/22 1529  Tests / Events: SURGICAL PATHOLOGY Surgical Pathology CASE: [id number] PATIENT: Natasha [name] Flow Pathology Report     Clinical history: marginal zone lymphoma     DIAGNOSIS:  -Monoclonal B-cell population identified -See comment  COMMENT:  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   CHF (congestive heart failure) ([facility])    COPD (chronic obstructive pulmonary disease) ([place])    Diabetes mellitus without complication ([facility])    Hypotension    Pleural effusion    ARMc 839m,  2 weeks ago   Vaginal delivery    x 5     SURGICAL HISTORY   Past Surgical History:  Procedure Laterality Date   IR IMAGING GUIDED PORT INSERTION  08/16/2022   TONSILLECTOMY Bilateral    as a child     FAMILY HISTORY   Family History  Problem Relation Age of Onset   Breast cancer Mother    Alzheimer's disease Father    Lung cancer Father        lung   Varicose Veins Brother    Heart attack Brother      SOCIAL HISTORY   Social History   Tobacco Use   Smoking status: Former    Packs/day: 2.00    Years: 55.00    Total pack years: 110.00    Types: Cigarettes    Start date: 07/07/1961    Quit date: 08/02/2022    Years since quitting: 0.2   Smokeless tobacco: Never   Tobacco comments:    1/2 pack she states but family says 2 PPD  Vaping Use   Vaping Use: Never used  Substance Use Topics   Alcohol use: No   Drug use: Never     MEDICATIONS   Current Medication:  Current Facility-Administered Medications:    0.9 %  sodium chloride infusion, , Intravenous, PRN, AOttie Glazier MD, Last Rate: 10 mL/hr at 11/08/22 0800, Infusion Verify at 11/08/22 0800   azithromycin (ZITHROMAX) 500 mg in sodium chloride 0.9 % 250 mL IVPB, 500 mg, Intravenous, Q24H, AOttie Glazier MD, Stopped at 11/07/22 2053   cefTRIAXone (ROCEPHIN) 1 g in sodium chloride 0.9 % 100 mL IVPB, 1 g, Intravenous, Q24H, AOttie Glazier MD, Stopped at 11/07/22 2153   Chlorhexidine Gluconate Cloth 2 % PADS 6 each, 6 each, Topical, Daily, ALanney Gins Tyreona Panjwani, MD, 6 each at 11/07/22 1729   docusate sodium (COLACE) capsule 100 mg, 100 mg, Oral, BID PRN, ALanney Gins  Lumen Brinlee, MD   insulin aspart (novoLOG) injection 0-20 Units, 0-20 Units, Subcutaneous, Q4H, [place], EBing Neighbors NP, 4 Units at 11/07/22 2123   magnesium sulfate IVPB 2 g 50 mL, 2 g, Intravenous, Once, GDallie Piles RPH, Last Rate: 50 mL/hr at 11/08/22 0829, 2 g at 11/08/22 0829   morphine (PF) 2 MG/ML injection 1 mg, 1 mg, Intravenous, Q3H PRN, AOttie Glazier MD, 1 mg at 11/08/22 0214   norepinephrine (LEVOPHED) '4mg'$  in 2571m(0.016 mg/mL) premix infusion, 0-40 mcg/min, Intravenous, Continuous, Siadecki, Sebastian, MD, Last Rate: 22.5 mL/hr at 11/08/22 0800, 6 mcg/min at 11/08/22 0800   Oral care mouth rinse, 15 mL, Mouth Rinse, PRN, AlOttie GlazierMD   oseltamivir (TAMIFLU) capsule 30 mg, 30 mg, Oral, BID, AlOttie GlazierMD, 30 mg at 11/07/22 2120  oseltamivir (TAMIFLU) capsule 75 mg, 75 mg, Oral, Once, Kwasi Joung, MD   pantoprazole (PROTONIX) injection 40 mg, 40 mg, Intravenous, Q12H, Irean Kendricks, MD, 40 mg at 11/07/22 1954   polyethylene glycol (MIRALAX / GLYCOLAX) packet 17 g, 17 g, Oral, Daily PRN, Lanney Gins, Sedra Morfin, MD   sodium chloride flush (NS) 0.9 % injection 10-40 mL, 10-40 mL, Intracatheter, PRN, Ottie Glazier, MD, 10 mL at 11/07/22 1954   thiamine (VITAMIN B1) injection 100 mg, 100 mg, Intravenous, Daily, Lanney Gins, Imaad Reuss, MD, 100 mg at 11/07/22 1815    ALLERGIES   Patient has no known allergies.    REVIEW OF SYSTEMS     10 point ROS conducted is negative except for back pain and weakness  PHYSICAL EXAMINATION   Vital Signs: Temp:  [93.9 F (34.4 C)-100.2 F (37.9 C)] 99 F (37.2 C) (01/15 0800) Pulse Rate:  [84-105] 91 (01/15 0800) Resp:  [11-85] 20 (01/15 0800) BP: (66-123)/(36-73) 86/73 (01/15 0800) SpO2:  [94 %-100 %] 97 % (01/15 0800) Weight:  [70 kg-73.3 kg] 70 kg (01/15 0500)  GENERAL: Age-appropriate mild distress due to acutely ill status HEAD: Normocephalic, atraumatic.  EYES: Pupils equal, round, reactive to light.  No scleral  icterus.  MOUTH: Moist mucosal membrane. NECK: Supple. No thyromegaly. No nodules. No JVD.  PULMONARY: Decreased breath sounds bilaterally CARDIOVASCULAR: S1 and S2. Regular rate and rhythm. No murmurs, rubs, or gallops.  GASTROINTESTINAL: Soft, nontender, non-distended. No masses. Positive bowel sounds. No hepatosplenomegaly.  MUSCULOSKELETAL: No swelling, clubbing, or edema.  NEUROLOGIC: Mild distress due to acute illness SKIN:intact,warm,dry   PERTINENT DATA     Infusions:  sodium chloride 10 mL/hr at 11/08/22 0800   azithromycin Stopped (11/07/22 2053)   cefTRIAXone (ROCEPHIN)  IV Stopped (11/07/22 2153)   magnesium sulfate bolus IVPB 2 g (11/08/22 0829)   norepinephrine (LEVOPHED) Adult infusion 6 mcg/min (11/08/22 0800)   Scheduled Medications:  Chlorhexidine Gluconate Cloth  6 each Topical Daily   insulin aspart  0-20 Units Subcutaneous Q4H   oseltamivir  30 mg Oral BID   oseltamivir  75 mg Oral Once   pantoprazole (PROTONIX) IV  40 mg Intravenous Q12H   thiamine (VITAMIN B1) injection  100 mg Intravenous Daily   PRN Medications: sodium chloride, docusate sodium, morphine injection, mouth rinse, polyethylene glycol, sodium chloride flush Hemodynamic parameters:   Intake/Output: 01/14 0701 - 01/15 0700 In: 2005.3 [I.V.:840.1; Blood:765; IV Piggyback:400.2] Out: 2650 [Urine:2650]  Ventilator  Settings:    LAB RESULTS:  Basic Metabolic Panel: Recent Labs  Lab 11/05/22 1113 11/07/22 1253 11/07/22 2302 11/08/22 0425  NA 143 137 139 140  K 4.7 5.7* 5.1 4.6  CL 104 104 104 104  CO2 '31 25 27 29  '$ GLUCOSE 116* 267* 137* 126*  BUN 35* 56* 59* 57*  CREATININE 0.79 1.03* 1.00 1.07*  CALCIUM 8.7* 8.2* 8.0* 8.0*  MG  --   --   --  1.5*  PHOS  --   --   --  4.8*    Liver Function Tests: Recent Labs  Lab 11/05/22 1113 11/07/22 1253  AST 17 20  ALT 10 11  ALKPHOS 159* 123  BILITOT 1.4* 1.2  PROT 4.7* 3.7*  ALBUMIN 2.8* 2.1*    No results for input(s):  "LIPASE", "AMYLASE" in the last 168 hours. No results for input(s): "AMMONIA" in the last 168 hours. CBC: Recent Labs  Lab 11/05/22 1113 11/07/22 1253 11/07/22 1824 11/07/22 2302 11/08/22 0425 11/08/22 0847  WBC 4.7 6.8  --  9.4 8.2  --   NEUTROABS 1.6*  --   --   --  2.1  --   HGB 7.7* 4.9* 6.0* 7.1* 6.8*  6.7* 8.2*  HCT 26.2* 16.7* 19.5* 22.4* 21.1*  20.7* 24.3*  MCV 92.9 96.5  --  88.5 88.7  --   PLT 55* 57*  --  55* 64*  --     Cardiac Enzymes: No results for input(s): "CKTOTAL", "CKMB", "CKMBINDEX", "TROPONINI" in the last 168 hours. BNP: Invalid input(s): "POCBNP" CBG: Recent Labs  Lab 11/07/22 1714 11/07/22 2112 11/07/22 2333 11/08/22 0412 11/08/22 0741  GLUCAP 207* 172* 124* 112* 139*       IMAGING RESULTS:  Imaging: CT CHEST ABDOMEN PELVIS W CONTRAST  Result Date: 11/07/2022 CLINICAL DATA:  Sepsis. Dementia. Lymphoma. Hemorrhagic shock requiring blood transfusions and pressor support. * Tracking Code: BO * EXAM: CT CHEST, ABDOMEN, AND PELVIS WITH CONTRAST TECHNIQUE: Multidetector CT imaging of the chest, abdomen and pelvis was performed following the standard protocol during bolus administration of intravenous contrast. RADIATION DOSE REDUCTION: This exam was performed according to the departmental dose-optimization program which includes automated exposure control, adjustment of the mA and/or kV according to patient size and/or use of iterative reconstruction technique. CONTRAST:  162m OMNIPAQUE IOHEXOL 300 MG/ML  SOLN COMPARISON:  Chest radiograph from earlier today. 09/19/2022 CT abdomen/pelvis. 07/12/2022 chest CT angiogram. FINDINGS: CT CHEST FINDINGS Cardiovascular: Normal heart size. No significant pericardial effusion/thickening. Left anterior descending coronary atherosclerosis. Right internal jugular Port-A-Cath terminates at the cavoatrial junction. Atherosclerotic nonaneurysmal thoracic aorta. Normal caliber pulmonary arteries. No central pulmonary  emboli. Mediastinum/Nodes: No significant thyroid nodules. Unremarkable esophagus. Widespread mild to moderate bilateral axillary, bilateral lower neck, bilateral mediastinal and bilateral hilar lymphadenopathy, overall mildly decreased. Representative 1.6 cm right axillary node (series 3/image 23), decreased from 2.0 cm. Representative 1.7 cm right paratracheal node (series 3/image 19), mildly decreased from 1.9 cm. Representative 1.6 cm AP window node (series 3/image 20), stable. Lungs/Pleura: No pneumothorax. Moderate to large right and small to moderate left dependent pleural effusions with mild smooth bilateral pleural thickening and enhancement. New patchy consolidation in the posterior right upper lobe. New prominent patchy tree-in-bud opacities throughout the bilateral upper lobes. Calcified 1.9 cm posterior right upper lobe nodule is stable. Moderate compressive atelectasis in the posterior lower lobes bilaterally. Musculoskeletal: No aggressive appearing focal osseous lesions. Mild thoracic spondylosis. Mild anasarca. CT ABDOMEN PELVIS FINDINGS Hepatobiliary: Normal liver with no liver mass. Gallstones within the contracted gallbladder with no definite gallbladder wall thickening. No biliary ductal dilatation. Pancreas: Normal, with no mass or duct dilation. Spleen: Marked splenomegaly measuring 21.5 cm craniocaudal length, previously 19.7 cm, mildly increased. No splenic masses. Adrenals/Urinary Tract: Normal adrenals. Normal kidneys with no hydronephrosis and no renal mass. Well-positioned Foley catheter within the bladder. Expected minimal gas in the nondependent bladder from instrumentation. Chronic mild diffuse bladder wall thickening. Stomach/Bowel: Normal non-distended stomach. Normal caliber small bowel loops. Appendix not discretely visualized. Mild wall thickening in the nondilated terminal ileum and cecum in the right lower quadrant. No additional sites of small or large bowel wall thickening.  No significant diverticulosis. Vascular/Lymphatic: Atherosclerotic nonaneurysmal abdominal aorta. Patent portal, splenic, hepatic and renal veins. Widespread moderate lymphadenopathy throughout the retroperitoneum and bilateral pelvis is overall slightly decreased from 09/19/2022 CT. Representative 2.3 cm portacaval node (series 3/image 63), decreased from 2.5 cm. Representative 1.7 cm left para-aortic node (series 3/image 69), decreased from 2.0 cm. Representative 1.8 cm right inguinal node (series 3/image 111), decreased from 2.0  cm. Reproductive: Grossly normal uterus.  No adnexal mass. Other: No pneumoperitoneum. No focal fluid collection. Moderate anasarca. Musculoskeletal: No aggressive appearing focal osseous lesions. Chronic L2 and L3 vertebral compression fractures. Mild-to-moderate lumbar degenerative disc disease. IMPRESSION: 1. Moderate to large right and small to moderate left dependent pleural effusions with mild smooth bilateral pleural thickening and enhancement. 2. New patchy consolidation in the posterior right upper lobe. New prominent patchy tree-in-bud opacities throughout the bilateral upper lobes. Findings are most compatible with multilobar bronchopneumonia. 3. Widespread lymphadenopathy in the chest, abdomen and pelvis is overall mildly decreased from prior CT imaging. 4. Marked splenomegaly, mildly increased. 5. Mild wall thickening in the nondilated terminal ileum and cecum in the right lower quadrant, nonspecific, cannot exclude third-spacing, ischemia or infectious enterocolitis. No evidence of bowel obstruction. No pneumatosis. No pneumoperitoneum. 6. Moderate anasarca. 7. Cholelithiasis. 8. Chronic mild diffuse bladder wall thickening, nonspecific. 9.  Aortic Atherosclerosis (ICD10-I70.0). Electronically Signed   By: Ilona Sorrel M.D.   On: 11/07/2022 16:31   DG Chest Port 1 View  Result Date: 11/07/2022 CLINICAL DATA:  Dyspnea, vomiting, rectal bleeding, COPD, lymphoma EXAM:  PORTABLE CHEST 1 VIEW COMPARISON:  10/27/2022 chest radiograph. FINDINGS: Stable right internal jugular Port-A-Cath terminating over the cavoatrial junction. Stable cardiomediastinal silhouette with mild cardiomegaly. No pneumothorax. Similar small bilateral pleural effusions. Moderate diffuse prominence of the parahilar interstitial markings with patchy bilateral parahilar and bibasilar lung opacities, worsened. IMPRESSION: 1. Mild cardiomegaly. Moderate diffuse prominence of the parahilar interstitial markings with patchy bilateral parahilar and bibasilar lung opacities, worsened, favor congestive heart failure with pulmonary edema. A component of aspiration or pneumonia at the lung bases is not excluded. 2. Similar small bilateral pleural effusions. Electronically Signed   By: Ilona Sorrel M.D.   On: 11/07/2022 14:06   '@PROBHOSP'$ @ CT CHEST ABDOMEN PELVIS W CONTRAST  Result Date: 11/07/2022 CLINICAL DATA:  Sepsis. Dementia. Lymphoma. Hemorrhagic shock requiring blood transfusions and pressor support. * Tracking Code: BO * EXAM: CT CHEST, ABDOMEN, AND PELVIS WITH CONTRAST TECHNIQUE: Multidetector CT imaging of the chest, abdomen and pelvis was performed following the standard protocol during bolus administration of intravenous contrast. RADIATION DOSE REDUCTION: This exam was performed according to the departmental dose-optimization program which includes automated exposure control, adjustment of the mA and/or kV according to patient size and/or use of iterative reconstruction technique. CONTRAST:  185m OMNIPAQUE IOHEXOL 300 MG/ML  SOLN COMPARISON:  Chest radiograph from earlier today. 09/19/2022 CT abdomen/pelvis. 07/12/2022 chest CT angiogram. FINDINGS: CT CHEST FINDINGS Cardiovascular: Normal heart size. No significant pericardial effusion/thickening. Left anterior descending coronary atherosclerosis. Right internal jugular Port-A-Cath terminates at the cavoatrial junction. Atherosclerotic nonaneurysmal  thoracic aorta. Normal caliber pulmonary arteries. No central pulmonary emboli. Mediastinum/Nodes: No significant thyroid nodules. Unremarkable esophagus. Widespread mild to moderate bilateral axillary, bilateral lower neck, bilateral mediastinal and bilateral hilar lymphadenopathy, overall mildly decreased. Representative 1.6 cm right axillary node (series 3/image 23), decreased from 2.0 cm. Representative 1.7 cm right paratracheal node (series 3/image 19), mildly decreased from 1.9 cm. Representative 1.6 cm AP window node (series 3/image 20), stable. Lungs/Pleura: No pneumothorax. Moderate to large right and small to moderate left dependent pleural effusions with mild smooth bilateral pleural thickening and enhancement. New patchy consolidation in the posterior right upper lobe. New prominent patchy tree-in-bud opacities throughout the bilateral upper lobes. Calcified 1.9 cm posterior right upper lobe nodule is stable. Moderate compressive atelectasis in the posterior lower lobes bilaterally. Musculoskeletal: No aggressive appearing focal osseous lesions. Mild thoracic spondylosis. Mild anasarca. CT  ABDOMEN PELVIS FINDINGS Hepatobiliary: Normal liver with no liver mass. Gallstones within the contracted gallbladder with no definite gallbladder wall thickening. No biliary ductal dilatation. Pancreas: Normal, with no mass or duct dilation. Spleen: Marked splenomegaly measuring 21.5 cm craniocaudal length, previously 19.7 cm, mildly increased. No splenic masses. Adrenals/Urinary Tract: Normal adrenals. Normal kidneys with no hydronephrosis and no renal mass. Well-positioned Foley catheter within the bladder. Expected minimal gas in the nondependent bladder from instrumentation. Chronic mild diffuse bladder wall thickening. Stomach/Bowel: Normal non-distended stomach. Normal caliber small bowel loops. Appendix not discretely visualized. Mild wall thickening in the nondilated terminal ileum and cecum in the right lower  quadrant. No additional sites of small or large bowel wall thickening. No significant diverticulosis. Vascular/Lymphatic: Atherosclerotic nonaneurysmal abdominal aorta. Patent portal, splenic, hepatic and renal veins. Widespread moderate lymphadenopathy throughout the retroperitoneum and bilateral pelvis is overall slightly decreased from 09/19/2022 CT. Representative 2.3 cm portacaval node (series 3/image 63), decreased from 2.5 cm. Representative 1.7 cm left para-aortic node (series 3/image 69), decreased from 2.0 cm. Representative 1.8 cm right inguinal node (series 3/image 111), decreased from 2.0 cm. Reproductive: Grossly normal uterus.  No adnexal mass. Other: No pneumoperitoneum. No focal fluid collection. Moderate anasarca. Musculoskeletal: No aggressive appearing focal osseous lesions. Chronic L2 and L3 vertebral compression fractures. Mild-to-moderate lumbar degenerative disc disease. IMPRESSION: 1. Moderate to large right and small to moderate left dependent pleural effusions with mild smooth bilateral pleural thickening and enhancement. 2. New patchy consolidation in the posterior right upper lobe. New prominent patchy tree-in-bud opacities throughout the bilateral upper lobes. Findings are most compatible with multilobar bronchopneumonia. 3. Widespread lymphadenopathy in the chest, abdomen and pelvis is overall mildly decreased from prior CT imaging. 4. Marked splenomegaly, mildly increased. 5. Mild wall thickening in the nondilated terminal ileum and cecum in the right lower quadrant, nonspecific, cannot exclude third-spacing, ischemia or infectious enterocolitis. No evidence of bowel obstruction. No pneumatosis. No pneumoperitoneum. 6. Moderate anasarca. 7. Cholelithiasis. 8. Chronic mild diffuse bladder wall thickening, nonspecific. 9.  Aortic Atherosclerosis (ICD10-I70.0). Electronically Signed   By: Ilona Sorrel M.D.   On: 11/07/2022 16:31   DG Chest Port 1 View  Result Date:  11/07/2022 CLINICAL DATA:  Dyspnea, vomiting, rectal bleeding, COPD, lymphoma EXAM: PORTABLE CHEST 1 VIEW COMPARISON:  10/27/2022 chest radiograph. FINDINGS: Stable right internal jugular Port-A-Cath terminating over the cavoatrial junction. Stable cardiomediastinal silhouette with mild cardiomegaly. No pneumothorax. Similar small bilateral pleural effusions. Moderate diffuse prominence of the parahilar interstitial markings with patchy bilateral parahilar and bibasilar lung opacities, worsened. IMPRESSION: 1. Mild cardiomegaly. Moderate diffuse prominence of the parahilar interstitial markings with patchy bilateral parahilar and bibasilar lung opacities, worsened, favor congestive heart failure with pulmonary edema. A component of aspiration or pneumonia at the lung bases is not excluded. 2. Similar small bilateral pleural effusions. Electronically Signed   By: Ilona Sorrel M.D.   On: 11/07/2022 14:06       Resp panel by RT-PCR (RSV, Flu A&B, Covid) Order: [id number] Status: Final result     Visible to patient: Yes (seen)     Next appt: 11/12/2022 at 02:40 PM in Family Medicine [name], NP)   Specimen Information: Nasal Swab  0 Result Notes      Component Ref Range & Units 12:53 2 wk ago  SARS Coronavirus 2 by RT PCR NEGATIVE NEGATIVE NEGATIVE CM  Comment: (NOTE) SARS-CoV-2 target nucleic acids are NOT DETECTED.  The SARS-CoV-2 RNA is generally detectable in upper respiratory specimens during the acute  phase of infection. The lowest concentration of SARS-CoV-2 viral copies this assay can detect is 138 copies/mL. A negative result does not preclude SARS-Cov-2 infection and should not be used as the sole basis for treatment or other patient management decisions. A negative result may occur with improper specimen collection/handling, submission of specimen other than nasopharyngeal swab, presence of viral mutation(s) within the areas targeted by this assay, and inadequate number of  viral copies(<138 copies/mL). A negative result must be combined with clinical observations, patient history, and epidemiological information. The expected result is Negative.  Fact Sheet for Patients: EntrepreneurPulse.com.au  Fact Sheet for Healthcare Providers: IncredibleEmployment.be  This test is not yet approved or cleared by the [place] FDA and has been authorized for detection and/or diagnosis of SARS-CoV-2 by FDA under an Emergency Use Authorization (EUA). This EUA will remain in effect (meaning this test can be used) for the duration of the COVID-19 declaration under Section 564(b)(1) of the Act, 21 U.S.C.section 360bbb-3(b)(1), unless the authorization is terminated or revoked sooner.    Influenza A by PCR NEGATIVE POSITIVE Abnormal  POSITIVE Abnormal   Influenza B by PCR NEGATIVE NEGATIVE        ASSESSMENT AND PLAN    -Multidisciplinary rounds held today  Hemorrhagic shock    -s/p PRBC transfusion and IVF   -shock physiology has improved   Acute blood loss anemia -s/p PRBC transfusion  -goal hB >8 -s/p CT abd - possible infectioous enteritis on scan, marked abdominal lymphadenopathy  Chronic centrilobular emphysema -oxygen as needed -albuterol PRN   Multifocal bilateral pneumonia  - hx of recurrent effusions and thoracentesis  - IR US guided thoracentesis post CT chest   - empiric Rocephin and Zithromax  -influenza A + tamiflu   Chronic diastolic CHF   Patient is edematous   Chronic recurrent pleural effusions   - patient may need repeat thoracentesis   - CT chest pending    Chronic dementia  - 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  ID -continue IV abx as prescibed -follow up cultures  GI/Nutrition GI PROPHYLAXIS as indicated DIET-->TF's as tolerated Constipation protocol as indicated  ENDO - ICU hypoglycemic\Hyperglycemia protocol -check FSBS per protocol   ELECTROLYTES -follow labs as  needed -replace as needed -pharmacy consultation   DVT/GI PRX ordered -SCDs  TRANSFUSIONS AS NEEDED MONITOR FSBS ASSESS the need for LABS as needed  Critical care provider statement:   Total critical care time: 33 minutes   Performed by: Lanney Gins MD   Critical care time was exclusive of separately billable procedures and treating other patients.   Critical care was necessary to treat or prevent imminent or life-threatening deterioration.   Critical care was time spent personally by me on the following activities: development of treatment plan with patient and/or surrogate as well as nursing, discussions with consultants, evaluation of patient's response to treatment, examination of patient, obtaining history from patient or surrogate, ordering and performing treatments and interventions, ordering and review of laboratory studies, ordering and review of radiographic studies, pulse oximetry and re-evaluation of patient's condition.    Ottie Glazier, M.D.  Pulmonary & Critical Care Medicine

## 2022-11-08 NOTE — TOC Initial Note (Signed)
Transition of Care [facility]) - Initial/Assessment Note    Patient Details  Name: Natasha [name] MRN: [id number] Date of Birth: 1946-10-23  [facility] CM/SW Contact:    Shelbie Hutching, RN Phone Number: 11/08/2022, 10:48 AM  Clinical Narrative:                 Patient admitted to the hospital for GI bleeding and hemorrhagic Shock.  RNCM met with patient at the bedside.  Patient reports that she lives at home in [place] and that her 2 granddaughters live with her, CiCi Glennon Mac and [employer].  She also says that [name] still lives there but that he doesn't come out of his room.  Patient is on chronic O2 at 4 L from [employer].  She is current with [facility] for PT and OT.  Patient's daughter provides transportation.   TOC will cont to follow, patient is currently in the ICU, palliative will be consulted, GI following.   Expected Discharge Plan: [place] Barriers to Discharge: Continued Medical Work up   Patient Goals and CMS Choice Patient states their goals for this hospitalization and ongoing recovery are:: wants for the bleeding to stop and find out where it is coming from          Expected Discharge Plan and Services   Discharge Planning Services: CM Consult Post Acute Care Choice: Resumption of Svcs/PTA Provider Living arrangements for the past 2 months: Single Family Home                 DME Arranged: N/A DME Agency: NA       HH Arranged: PT, OT HH Agency: [place] Date [place]: 11/08/22 Time [place]: [id number] Representative spoke with at [place]: [place]  Prior Living Arrangements/Services Living arrangements for the past 2 months: [place] with:: Relatives, Roommate Patient language and need for interpreter reviewed:: Yes Do you feel safe going back to the place where you live?: Yes      Need for Family Participation in Patient Care: Yes (Comment) Care giver support system in place?:  Yes (comment) Current home services: DME, Home PT, Home OT (oxygen) Criminal Activity/Legal Involvement Pertinent to Current Situation/Hospitalization: No - Comment as needed  Activities of Daily Living Home Assistive Devices/Equipment: Oxygen, Walker (specify type) ADL Screening (condition at time of admission) Patient's cognitive ability adequate to safely complete daily activities?: Yes Is the patient deaf or have difficulty hearing?: No Does the patient have difficulty seeing, even when wearing glasses/contacts?: No Does the patient have difficulty concentrating, remembering, or making decisions?: No Patient able to express need for assistance with ADLs?: Yes Does the patient have difficulty dressing or bathing?: Yes Independently performs ADLs?: No Does the patient have difficulty walking or climbing stairs?: Yes Weakness of Legs: Both Weakness of Arms/Hands: Both  Permission Sought/Granted Permission sought to share information with : Family Supports Permission granted to share information with : Yes, Verbal Permission Granted  Share Information with NAME: Lenon Oms     Permission granted to share info w Relationship: daughter  Permission granted to share info w Contact Information: [phone]  Emotional Assessment Appearance:: Appears stated age Attitude/Demeanor/Rapport: Inconsistent Affect (typically observed): Accepting, Quiet, Calm Orientation: : Oriented to Self, Oriented to Place, Oriented to Situation Alcohol / Substance Use: Not Applicable Psych Involvement: No (comment)  Admission diagnosis:  Hemorrhagic shock ([place]) [R57.8] Acute GI bleeding [K92.2] Patient Active Problem List  Diagnosis Date Noted   Hemorrhagic shock ([place]) 11/07/2022   Overweight (BMI 25.0-29.9) 10/21/2022   AKI (acute kidney injury) ([place]) 10/21/2022   Anemia associated with chemotherapy 10/21/2022   Influenza A 10/20/2022   Neutropenic fever ([place]) 10/20/2022   Chronic diastolic CHF  (congestive heart failure) ([place]) 10/20/2022   Myocardial injury 10/20/2022   Type II diabetes mellitus with renal manifestations ([place]) 10/20/2022   Iron deficiency anemia 09/19/2022   COPD with acute exacerbation ([place]) 08/29/2022   Diabetes mellitus without complication ([place]) 75/07/2584   Normocytic anemia 08/29/2022   Severe sepsis (HCC) 08/29/2022   Chronic respiratory failure with hypoxia ([place]) 08/16/2022   Acute on chronic anemia 08/02/2022   Acute on chronic congestive heart failure ([place]) 08/02/2022   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   Swelling of lower extremity 07/24/2022   Hypotension 07/24/2022   Palliative care encounter    COVID-19 virus infection 07/14/2022   Acute on chronic diastolic CHF (congestive heart failure) ([place]) 07/14/2022   Anemia of chronic disease 07/14/2022   Thrombocytopenia ([place]) 07/14/2022   Hypothyroidism 07/14/2022   Chronic kidney disease, stage 3a ([place]) 07/14/2022   Bilateral pleural effusion 07/12/2022   Diabetes mellitus, type II ([place]) 07/12/2022   Acute on chronic respiratory failure with hypoxia and hypercarbia ([place]) 07/12/2022   Recurrent right pleural effusion 05/12/2022   Lower extremity edema 05/12/2022   Other pancytopenia ([place]) 04/16/2022   Type 2 diabetes mellitus with proteinuria ([place]) 04/09/2022   Atherosclerosis of aorta ([place]) 04/09/2022   Chronic obstructive pulmonary disease (COPD) ([place]) 04/09/2022   SOB (shortness of breath) on exertion 04/09/2022   Acute cough 04/09/2022   Cigarette nicotine dependence, uncomplicated 27/78/2423   Post herpetic neuralgia 03/14/2020   Gall stones 10/23/2019   Lymphoma ([place]) 10/23/2019   Generalized lymphadenopathy 09/08/2018   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[place]) 09/08/2018   Primary osteoarthritis of both knees 07/07/2018   Senile purpura ([place]) 07/07/2018   PCP:  [name], NP Pharmacy:   [id number] [id number]: [phone]: [phone]    Social Determinants of Health (SDOH) Social History: SDOH Screenings   Food Insecurity: No Food Insecurity (11/08/2022)  Recent Concern: Food Insecurity - Food Insecurity Present (08/30/2022)  Housing: Low Risk  (11/08/2022)  Transportation Needs: No Transportation Needs (11/08/2022)  Recent Concern: Transportation Needs - Unmet Transportation Needs (08/30/2022)  Utilities: Not At Risk (11/08/2022)  Recent Concern: Utilities - At Risk (10/06/2022)  Alcohol Screen: Low Risk  (08/24/2022)  Depression (PHQ2-9): Low Risk  (08/24/2022)  Financial Resource Strain: Medium Risk (08/24/2022)  Physical Activity: Inactive (08/24/2022)  Social Connections: Socially Isolated (08/24/2022)  Stress: Stress Concern Present (08/24/2022)  Tobacco Use: Medium Risk (11/07/2022)   SDOH Interventions:     Readmission Risk Interventions    10/21/2022    1:13 PM 08/17/2022   11:05 AM 08/04/2022    2:54 PM  Readmission Risk Prevention Plan  Transportation Screening Complete Complete Complete  PCP or Specialist Appt within 5-7 Days   Complete  PCP or Specialist Appt within 3-5 Days  Complete   Home Care Screening   Complete  Medication Review (RN CM)   Complete  HRI or Home Care Consult  Complete   Social Work Consult for [place]anning/Counseling  Complete   Palliative Care Screening  Not Applicable   Medication Review (Press photographer Complete Complete   PCP or Specialist appointment within 3-5 days  of discharge Complete    HRI or Home Care Consult Complete    SW Recovery Care/Counseling Consult Complete    Palliative Care Screening Not [place] Not Applicable

## 2022-11-08 NOTE — Progress Notes (Signed)
GI Inpatient Follow-up Note  Subjective:  Patient seen and still requiring pressors to maintain blood pressure. No further bleeding.  Scheduled Inpatient Medications:   [START ON 11/09/2022] acyclovir  400 mg Oral Daily   Chlorhexidine Gluconate Cloth  6 each Topical Daily   insulin aspart  0-20 Units Subcutaneous Q4H   [START ON 11/09/2022] levothyroxine  50 mcg Oral Q0600   oseltamivir  30 mg Oral BID   oseltamivir  75 mg Oral Once   pantoprazole (PROTONIX) IV  40 mg Intravenous Q12H   thiamine (VITAMIN B1) injection  100 mg Intravenous Daily    Continuous Inpatient Infusions:    sodium chloride 10 mL/hr at 11/08/22 1139   sodium chloride     azithromycin Stopped (11/07/22 2053)   cefTRIAXone (ROCEPHIN)  IV Stopped (11/07/22 2153)   norepinephrine (LEVOPHED) Adult infusion      PRN Inpatient Medications:  sodium chloride, docusate sodium, morphine injection, mouth rinse, polyethylene glycol, sodium chloride flush  Review of Systems:  Review of Systems  Constitutional:  Positive for malaise/fatigue. Negative for chills and fever.  Respiratory:  Positive for shortness of breath.   Cardiovascular:  Positive for leg swelling.  Gastrointestinal:  Positive for abdominal pain. Negative for blood in stool, nausea and vomiting.  Musculoskeletal:  Positive for joint pain.  Skin:  Negative for rash.  Neurological:  Negative for focal weakness.  Psychiatric/Behavioral:  Negative for substance abuse.   All other systems reviewed and are negative.     Physical Examination: BP (!) 100/59   Pulse 90   Temp 99.1 F (37.3 C)   Resp 19   Ht '5\' 4"'$  (1.626 m)   Wt 70 kg   SpO2 96%   BMI 26.49 kg/m  Gen: NAD, alert and oriented x 4 HEENT: PEERLA, EOMI, Neck: supple, no JVD or thyromegaly Chest: decreased breath sounds and pursed lip breathing Abd: soft, non-tender, non-distended Ext: 2+ edema Skin: no rash or lesions noted Lymph: no LAD  Data: Lab Results  Component Value  Date   WBC 8.2 11/08/2022   HGB 7.9 (L) 11/08/2022   HCT 23.8 (L) 11/08/2022   MCV 88.7 11/08/2022   PLT 64 (L) 11/08/2022   Recent Labs  Lab 11/08/22 0425 11/08/22 0847 11/08/22 1103  HGB 6.8*  6.7* 8.2* 7.9*   Lab Results  Component Value Date   NA 140 11/08/2022   K 4.6 11/08/2022   CL 104 11/08/2022   CO2 29 11/08/2022   BUN 57 (H) 11/08/2022   CREATININE 1.07 (H) 11/08/2022   Lab Results  Component Value Date   ALT 11 11/07/2022   AST 20 11/07/2022   ALKPHOS 123 11/07/2022   BILITOT 1.2 11/07/2022   Recent Labs  Lab 11/07/22 1254  INR 1.4*   Assessment/Plan: Natasha [name] is a 77 y.o. 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 Differential includes PUD versus malignant bleeding. Currently too unstable for any procedure and even if blood pressure improved, she would still be extremely high risk for any procedure. No further bleeding currently.  Recommendations:  - transfuse to keep hemoglobin > 7 - maintain two large bore IV's - discussed with patient and Daughter that they should discuss the patient's goals of care and consider hospice as she has recurrent malignant effusions, severe COPD, HFpEF, and wide spread lymphoma. Patient has  poor insight into her disease processes - will re-evaluate tomorrow and discuss next steps - if recurrent bleeding overnight could consider CTA - ok for clears for now  Please call with any questions or concerns.  Raylene Miyamoto MD, MPH [place]

## 2022-11-08 NOTE — Plan of Care (Signed)
Patient remains in ICU. Weaning Levophed as tolerated.   Problem: Education: Goal: Knowledge of General Education information will improve Description: Including pain rating scale, medication(s)/side effects and non-pharmacologic comfort measures Outcome: Progressing   Problem: Health Behavior/Discharge Planning: Goal: Ability to manage health-related needs will improve Outcome: Progressing   Problem: Clinical Measurements: Goal: Ability to maintain clinical measurements within normal limits will improve Outcome: Progressing Goal: Will remain free from infection Outcome: Progressing Goal: Diagnostic test results will improve Outcome: Progressing Goal: Respiratory complications will improve Outcome: Progressing Goal: Cardiovascular complication will be avoided Outcome: Progressing   Problem: Activity: Goal: Risk for activity intolerance will decrease Outcome: Progressing   Problem: Nutrition: Goal: Adequate nutrition will be maintained Outcome: Progressing   Problem: Coping: Goal: Level of anxiety will decrease Outcome: Progressing   Problem: Elimination: Goal: Will not experience complications related to bowel motility Outcome: Progressing Goal: Will not experience complications related to urinary retention Outcome: Progressing   Problem: Pain Managment: Goal: General experience of comfort will improve Outcome: Progressing   Problem: Safety: Goal: Ability to remain free from injury will improve Outcome: Progressing   Problem: Skin Integrity: Goal: Risk for impaired skin integrity will decrease Outcome: Progressing   Problem: Education: Goal: Ability to describe self-care measures that may prevent or decrease complications (Diabetes Survival Skills Education) will improve Outcome: Progressing Goal: Individualized Educational Video(s) Outcome: Progressing   Problem: Coping: Goal: Ability to adjust to condition or change in health will improve Outcome:  Progressing   Problem: Fluid Volume: Goal: Ability to maintain a balanced intake and output will improve Outcome: Progressing   Problem: Health Behavior/Discharge Planning: Goal: Ability to identify and utilize available resources and services will improve Outcome: Progressing Goal: Ability to manage health-related needs will improve Outcome: Progressing   Problem: Metabolic: Goal: Ability to maintain appropriate glucose levels will improve Outcome: Progressing   Problem: Nutritional: Goal: Maintenance of adequate nutrition will improve Outcome: Progressing Goal: Progress toward achieving an optimal weight will improve Outcome: Progressing   Problem: Skin Integrity: Goal: Risk for impaired skin integrity will decrease Outcome: Progressing   Problem: Tissue Perfusion: Goal: Adequacy of tissue perfusion will improve Outcome: Progressing

## 2022-11-08 NOTE — Progress Notes (Signed)
[place] for Electrolyte Monitoring and Replacement   Recent Labs: Potassium (mmol/L)  Date Value  11/08/2022 4.6   Magnesium (mg/dL)  Date Value  11/08/2022 1.5 (L)   Calcium (mg/dL)  Date Value  11/08/2022 8.0 (L)   Albumin (g/dL)  Date Value  11/07/2022 2.1 (L)  06/22/2022 3.4 (L)   Phosphorus (mg/dL)  Date Value  11/08/2022 4.8 (H)   Sodium (mmol/L)  Date Value  11/08/2022 140  06/22/2022 143    Assessment: 77 y.o. female with medical history significant of nodal marginal zone B-cell lymphoma on [place] treatment, COPD on 3 L oxygen, diabetes mellitus, diastolic CHF, hypothyroidism, former smoker, thrombocytopenia, anemia, who presents with SOB and fever.   Goal of Therapy:  Electrolytes WNL  Plan:  2 grams IV magnesium sulfate x 1 Recheck electrolytes in am  Dallie Piles ,PharmD Clinical Pharmacist 11/08/2022 6:56 AM

## 2022-11-09 ENCOUNTER — Encounter
Admission: EM | Disposition: A | Discharge status: Home Health | Payer: Self-pay | Source: Home / Self Care | Attending: Pulmonary Disease

## 2022-11-09 LAB — GASTROINTESTINAL PANEL BY PCR, STOOL (REPLACES STOOL CULTURE)

## 2022-11-09 LAB — CBC WITH DIFFERENTIAL/PLATELET
Abs Immature Granulocytes: 0.22 10*3/uL — ABNORMAL HIGH (ref 0.00–0.07)
Basophils Absolute: 0 10*3/uL (ref 0.0–0.1)
Basophils Relative: 1 %
Eosinophils Absolute: 0.1 10*3/uL (ref 0.0–0.5)
Eosinophils Relative: 2 %
HCT: 22.7 % — ABNORMAL LOW (ref 36.0–46.0)
Hemoglobin: 7.2 g/dL — ABNORMAL LOW (ref 12.0–15.0)
Immature Granulocytes: 4 %
Lymphocytes Relative: 58 %
Lymphs Abs: 3.4 10*3/uL (ref 0.7–4.0)
MCH: 28.5 pg (ref 26.0–34.0)
MCHC: 31.7 g/dL (ref 30.0–36.0)
MCV: 89.7 fL (ref 80.0–100.0)
Monocytes Absolute: 0.5 10*3/uL (ref 0.1–1.0)
Monocytes Relative: 8 %
Neutro Abs: 1.6 10*3/uL — ABNORMAL LOW (ref 1.7–7.7)
Neutrophils Relative %: 27 %
Platelets: 58 10*3/uL — ABNORMAL LOW (ref 150–400)
RBC: 2.53 MIL/uL — ABNORMAL LOW (ref 3.87–5.11)
RDW: 19.1 % — ABNORMAL HIGH (ref 11.5–15.5)
WBC: 5.8 10*3/uL (ref 4.0–10.5)
nRBC: 0.9 % — ABNORMAL HIGH (ref 0.0–0.2)

## 2022-11-09 LAB — PHOSPHORUS: Phosphorus: 4.9 mg/dL — ABNORMAL HIGH (ref 2.5–4.6)

## 2022-11-09 LAB — GLUCOSE, CAPILLARY
Glucose-Capillary: 101 mg/dL — ABNORMAL HIGH (ref 70–99)
Glucose-Capillary: 120 mg/dL — ABNORMAL HIGH (ref 70–99)
Glucose-Capillary: 147 mg/dL — ABNORMAL HIGH (ref 70–99)
Glucose-Capillary: 165 mg/dL — ABNORMAL HIGH (ref 70–99)
Glucose-Capillary: 199 mg/dL — ABNORMAL HIGH (ref 70–99)
Glucose-Capillary: 81 mg/dL (ref 70–99)

## 2022-11-09 LAB — HEMOGLOBIN AND HEMATOCRIT, BLOOD
HCT: 21.8 % — ABNORMAL LOW (ref 36.0–46.0)
HCT: 22.7 % — ABNORMAL LOW (ref 36.0–46.0)
Hemoglobin: 7.1 g/dL — ABNORMAL LOW (ref 12.0–15.0)
Hemoglobin: 7.3 g/dL — ABNORMAL LOW (ref 12.0–15.0)

## 2022-11-09 LAB — BASIC METABOLIC PANEL
Anion gap: 5 (ref 5–15)
BUN: 47 mg/dL — ABNORMAL HIGH (ref 8–23)
CO2: 29 mmol/L (ref 22–32)
Calcium: 8.3 mg/dL — ABNORMAL LOW (ref 8.9–10.3)
Chloride: 107 mmol/L (ref 98–111)
Creatinine, Ser: 1.1 mg/dL — ABNORMAL HIGH (ref 0.44–1.00)
GFR, Estimated: 53 mL/min — ABNORMAL LOW (ref >60)
Glucose, Bld: 109 mg/dL — ABNORMAL HIGH (ref 70–99)
Potassium: 4.1 mmol/L (ref 3.5–5.1)
Sodium: 141 mmol/L (ref 135–145)

## 2022-11-09 LAB — C DIFFICILE QUICK SCREEN W PCR REFLEX
C Diff antigen: NEGATIVE
C Diff interpretation: NOT DETECTED
C Diff toxin: NEGATIVE

## 2022-11-09 LAB — PLATELET COUNT: Platelets: 49 10*3/uL — ABNORMAL LOW (ref 150–400)

## 2022-11-09 LAB — MAGNESIUM: Magnesium: 1.9 mg/dL (ref 1.7–2.4)

## 2022-11-09 LAB — PROCALCITONIN: Procalcitonin: 0.31 ng/mL

## 2022-11-09 SURGERY — ESOPHAGOGASTRODUODENOSCOPY (EGD) WITH PROPOFOL
Anesthesia: Monitor Anesthesia Care

## 2022-11-09 MED ORDER — INSULIN ASPART 100 UNIT/ML IJ SOLN
0.0000 [IU] | INTRAMUSCULAR | Status: DC
  Administered 2022-11-09: 3 [IU] via SUBCUTANEOUS
  Administered 2022-11-09: 2 [IU] via SUBCUTANEOUS
  Administered 2022-11-09 – 2022-11-10 (×2): 3 [IU] via SUBCUTANEOUS
  Administered 2022-11-10: 2 [IU] via SUBCUTANEOUS
  Administered 2022-11-10: 3 [IU] via SUBCUTANEOUS
  Administered 2022-11-10: 5 [IU] via SUBCUTANEOUS
  Administered 2022-11-11 (×2): 3 [IU] via SUBCUTANEOUS
  Administered 2022-11-12: 5 [IU] via SUBCUTANEOUS
  Administered 2022-11-12: 2 [IU] via SUBCUTANEOUS
  Filled 2022-11-09 (×11): qty 1

## 2022-11-09 MED ORDER — LACTATED RINGERS IV SOLN: INTRAVENOUS | Status: DC

## 2022-11-09 NOTE — Progress Notes (Signed)
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  Patient coming in now with hemorrhagic shock requiring blood transfusion with requirement for Levophed.  Pulmonary critical care asked for admission due to shock physiology and acute blood loss anemia.  11/08/22- patient was unable to have GI procedure due to medical instability. She has poor prognosis overall, she relates she does not want to have intubation or CPR.  I will call palliative consultation.  There is plan for thoracentesis.  11/09/22- patient stopped bleeding and is more stable off vasopressors.   Lines/tubes : Urethral Catheter T. [place] Non-latex;Temperature probe 14 Fr. (Active)    Microbiology/Sepsis markers: Results for orders placed or performed during the hospital encounter of 11/07/22  Resp panel by RT-PCR (RSV, Flu A&B, Covid)     Status: Abnormal   Collection Time: 11/07/22 12:53 PM   Specimen: Nasal Swab  Result Value Ref Range Status   SARS Coronavirus 2 by RT PCR NEGATIVE NEGATIVE Final    Comment: (NOTE) SARS-CoV-2 target nucleic acids are NOT DETECTED.  The SARS-CoV-2 RNA is generally detectable in upper respiratory specimens during the acute phase of infection. The lowest concentration of SARS-CoV-2 viral copies this assay can detect is 138 copies/mL. A negative result does not preclude SARS-Cov-2 infection and  should not be used as the sole basis for treatment or other patient management decisions. A negative result may occur with  improper specimen collection/handling, submission of specimen other than nasopharyngeal swab, presence of viral mutation(s) within the areas targeted by this assay, and inadequate number of viral copies(<138 copies/mL). A negative result must be combined with clinical observations, patient history, and epidemiological information. The expected result is Negative.  Fact Sheet for Patients:  EntrepreneurPulse.com.au  Fact Sheet for Healthcare Providers:  IncredibleEmployment.be  This test is no t yet approved or cleared by the [place] FDA and  has been authorized for detection and/or diagnosis of SARS-CoV-2 by FDA under an Emergency Use Authorization (EUA). This EUA will remain  in effect (meaning this test can be used) for the duration of the COVID-19 declaration under Section 564(b)(1) of the Act, 21 U.S.C.section 360bbb-3(b)(1), unless the authorization is terminated  or revoked sooner.       Influenza A by PCR POSITIVE (A) NEGATIVE Final   Influenza B by PCR NEGATIVE NEGATIVE Final    Comment: (NOTE) The Xpert Xpress SARS-CoV-2/FLU/RSV plus assay is intended as an aid in the diagnosis of influenza from Nasopharyngeal swab specimens and should not be used as a sole basis for treatment. Nasal washings and aspirates are unacceptable for Xpert Xpress SARS-CoV-2/FLU/RSV testing.  Fact Sheet for Patients: EntrepreneurPulse.com.au  Fact Sheet for Healthcare Providers: IncredibleEmployment.be  This test is not yet approved or cleared by the [place] FDA and has been authorized for detection and/or diagnosis of SARS-CoV-2 by FDA under an Emergency Use Authorization (EUA). This EUA will remain in effect (meaning this test can be used) for the duration of the COVID-19 declaration  under Section 564(b)(1) of the Act, 21 U.S.C. section 360bbb-3(b)(1), unless the authorization is terminated or revoked.     Resp Syncytial Virus by  PCR NEGATIVE NEGATIVE Final    Comment: (NOTE) Fact Sheet for Patients: EntrepreneurPulse.com.au  Fact Sheet for Healthcare Providers: IncredibleEmployment.be  This test is not yet approved or cleared by the [place] FDA and has been authorized for detection and/or diagnosis of SARS-CoV-2 by FDA under an Emergency Use Authorization (EUA). This EUA will remain in effect (meaning this test can be used) for the duration of the COVID-19 declaration under Section 564(b)(1) of the Act, 21 U.S.C. section 360bbb-3(b)(1), unless the authorization is terminated or revoked.  Performed at [facility], [address] [place], [place] [address]   Respiratory (~20 pathogens) panel by PCR     Status: Abnormal   Collection Time: 11/07/22 12:53 PM   Specimen: Nasopharyngeal Swab; Respiratory  Result Value Ref Range Status   Adenovirus NOT DETECTED NOT DETECTED Final   Coronavirus 229E NOT DETECTED NOT DETECTED Final    Comment: (NOTE) The Coronavirus on the Respiratory Panel, DOES NOT test for the novel  Coronavirus (2019 nCoV)    Coronavirus HKU1 NOT DETECTED NOT DETECTED Final   Coronavirus NL63 NOT DETECTED NOT DETECTED Final   Coronavirus OC43 NOT DETECTED NOT DETECTED Final   Metapneumovirus NOT DETECTED NOT DETECTED Final   Rhinovirus / Enterovirus NOT DETECTED NOT DETECTED Final   Influenza A H1 2009 DETECTED (A) NOT DETECTED Final   Influenza B NOT DETECTED NOT DETECTED Final   Parainfluenza Virus 1 NOT DETECTED NOT DETECTED Final   Parainfluenza Virus 2 NOT DETECTED NOT DETECTED Final   Parainfluenza Virus 3 NOT DETECTED NOT DETECTED Final   Parainfluenza Virus 4 NOT DETECTED NOT DETECTED Final   Respiratory Syncytial Virus NOT DETECTED NOT DETECTED Final   Bordetella pertussis NOT  DETECTED NOT DETECTED Final   Bordetella Parapertussis NOT DETECTED NOT DETECTED Final   Chlamydophila pneumoniae NOT DETECTED NOT DETECTED Final   Mycoplasma pneumoniae NOT DETECTED NOT DETECTED Final    Comment: Performed at [facility], [place]. [address] [place], [place] [address]  MRSA Next Gen by PCR, Nasal     Status: None   Collection Time: 11/07/22  5:17 PM   Specimen: Nasal Mucosa; Nasal Swab  Result Value Ref Range Status   MRSA by PCR Next Gen NOT DETECTED NOT DETECTED Final    Comment: (NOTE) The GeneXpert MRSA Assay (FDA approved for NASAL specimens only), is one component of a comprehensive MRSA colonization surveillance program. It is not intended to diagnose MRSA infection nor to guide or monitor treatment for MRSA infections. Test performance is not FDA approved in patients less than 52 years old. Performed at [facility], [address] [place], [place] [address]     Anti-infectives:  Anti-infectives (From admission, onward)    Start     Dose/Rate Route Frequency Ordered Stop   11/09/22 1000  acyclovir (ZOVIRAX) 200 MG capsule 400 mg        400 mg Oral Daily 11/08/22 1108     11/07/22 2200  oseltamivir (TAMIFLU) capsule 30 mg        30 mg Oral 2 times daily 11/07/22 1531 11/12/22 0959   11/07/22 1545  oseltamivir (TAMIFLU) capsule 75 mg        75 mg Oral  Once 11/07/22 1542     11/07/22 1530  cefTRIAXone (ROCEPHIN) 1 g in sodium chloride 0.9 % 100 mL IVPB        1 g 200 mL/hr over 30 Minutes Intravenous Every 24 hours 11/07/22 1529  11/07/22 1530  azithromycin (ZITHROMAX) 500 mg in sodium chloride 0.9 % 250 mL IVPB        500 mg 250 mL/hr over 60 Minutes Intravenous Every 24 hours 11/07/22 1529          Tests / Events: SURGICAL PATHOLOGY Surgical Pathology CASE: [id number] PATIENT: Verneda Mangine Flow Pathology Report     Clinical history: marginal zone lymphoma     DIAGNOSIS:  -Monoclonal B-cell population  identified -See comment  COMMENT:  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   CHF (congestive heart failure) ([facility])    COPD (chronic obstructive pulmonary disease) ([place])    Diabetes mellitus without complication ([facility])    Hypotension    Pleural effusion    ARMc 897m,  2 weeks ago   Vaginal delivery    x 5     SURGICAL HISTORY   Past Surgical History:  Procedure Laterality Date   IR IMAGING GUIDED PORT INSERTION  08/16/2022   TONSILLECTOMY Bilateral    as a child     FAMILY HISTORY   Family History  Problem Relation Age of Onset   Breast cancer Mother    Alzheimer's disease Father    Lung cancer Father        lung   Varicose Veins Brother    Heart attack Brother      SOCIAL HISTORY   Social History   Tobacco Use   Smoking status: Former    Packs/day: 2.00    Years: 55.00    Total pack years: 110.00    Types: Cigarettes    Start date: 07/07/1961    Quit date: 08/02/2022    Years since quitting: 0.2   Smokeless tobacco: Never   Tobacco comments:    1/2 pack she states but family says 2 PPD  Vaping Use   Vaping Use: Never used  Substance Use Topics   Alcohol use: No   Drug use: Never     MEDICATIONS   Current Medication:  Current Facility-Administered Medications:    0.9 %  sodium chloride infusion, , Intravenous, PRN, AOttie Glazier MD, Stopped at 11/08/22 2351   0.9 %  sodium chloride infusion, 250 mL, Intravenous, Continuous, GVallery SaD, RPH   acyclovir (ZOVIRAX) 200 MG capsule 400 mg, 400 mg, Oral, Daily, GDallie Piles RPH, 400 mg at 11/09/22 1053   azithromycin (ZITHROMAX) 500 mg in sodium chloride 0.9 % 250 mL IVPB, 500 mg, Intravenous, Q24H, ALanney Gins Aaralyn Kil, MD, Stopped at 11/08/22 1801   cefTRIAXone  (ROCEPHIN) 1 g in sodium chloride 0.9 % 100 mL IVPB, 1 g, Intravenous, Q24H, AOttie Glazier MD, Stopped at 11/08/22 1650   Chlorhexidine Gluconate Cloth 2 % PADS 6 each, 6 each, Topical, Daily, Jes Costales, MD, 6 each at 11/09/22 1054   docusate sodium (COLACE) capsule 100 mg, 100 mg, Oral, BID PRN, ALanney Gins Bosco Paparella, MD   guaiFENesin (ROBITUSSIN) 100 MG/5ML liquid 5 mL, 5 mL, Oral, Q4H PRN, NTeressa Lower NP, 5 mL at 11/08/22 2001   insulin aspart (novoLOG) injection 0-15 Units, 0-15 Units, Subcutaneous, Q4H, NTeressa Lower NP   lactated ringers infusion, , Intravenous, Continuous, NTeressa Lower NP, Last Rate: 50 mL/hr at 11/09/22 1300, Infusion Verify at  11/09/22 1300   levothyroxine (SYNTHROID) tablet 50 mcg, 50 mcg, Oral, Q0600, Dallie Piles, RPH, 50 mcg at 11/09/22 0536   morphine (PF) 2 MG/ML injection 1 mg, 1 mg, Intravenous, Q3H PRN, Ottie Glazier, MD, 1 mg at 11/08/22 1007   norepinephrine (LEVOPHED) '4mg'$  in 274m (0.016 mg/mL) premix infusion, 2-10 mcg/min, Intravenous, Titrated, GDallie Piles RPH, Stopped at 11/09/22 1114   Oral care mouth rinse, 15 mL, Mouth Rinse, PRN, AOttie Glazier MD   oseltamivir (TAMIFLU) capsule 30 mg, 30 mg, Oral, BID, ALanney Gins Sammie Schermerhorn, MD, 30 mg at 11/09/22 1054   oseltamivir (TAMIFLU) capsule 75 mg, 75 mg, Oral, Once, AOttie Glazier MD   pantoprazole (PROTONIX) injection 40 mg, 40 mg, Intravenous, Q12H, Arelis Neumeier, MD, 40 mg at 11/09/22 1054   polyethylene glycol (MIRALAX / GLYCOLAX) packet 17 g, 17 g, Oral, Daily PRN, ALanney Gins Lattie Riege, MD   sodium chloride flush (NS) 0.9 % injection 10-40 mL, 10-40 mL, Intracatheter, PRN, ALanney Gins Greer Koeppen, MD, 10 mL at 11/07/22 1954   thiamine (VITAMIN B1) injection 100 mg, 100 mg, Intravenous, Daily, ALanney Gins Lamia Mariner, MD, 100 mg at 11/09/22 1057    ALLERGIES   Patient has no known allergies.    REVIEW OF SYSTEMS     10 point ROS conducted is negative except for back pain and  weakness  PHYSICAL EXAMINATION   Vital Signs: Temp:  [98.1 F (36.7 C)-99.3 F (37.4 C)] 98.8 F (37.1 C) (01/16 1315) Pulse Rate:  [76-96] 84 (01/16 1315) Resp:  [12-38] 21 (01/16 1315) BP: (82-116)/(51-91) 102/59 (01/16 1315) SpO2:  [89 %-100 %] 99 % (01/16 1315) Weight:  [69.4 kg] 69.4 kg (01/16 0415)  GENERAL: Age-appropriate mild distress due to acutely ill status HEAD: Normocephalic, atraumatic.  EYES: Pupils equal, round, reactive to light.  No scleral icterus.  MOUTH: Moist mucosal membrane. NECK: Supple. No thyromegaly. No nodules. No JVD.  PULMONARY: Decreased breath sounds bilaterally CARDIOVASCULAR: S1 and S2. Regular rate and rhythm. No murmurs, rubs, or gallops.  GASTROINTESTINAL: Soft, nontender, non-distended. No masses. Positive bowel sounds. No hepatosplenomegaly.  MUSCULOSKELETAL: No swelling, clubbing, or edema.  NEUROLOGIC: Mild distress due to acute illness SKIN:intact,warm,dry   PERTINENT DATA     Infusions:  sodium chloride Stopped (11/08/22 2351)   sodium chloride     azithromycin Stopped (11/08/22 1801)   cefTRIAXone (ROCEPHIN)  IV Stopped (11/08/22 1650)   lactated ringers 50 mL/hr at 11/09/22 1300   norepinephrine (LEVOPHED) Adult infusion Stopped (11/09/22 1114)   Scheduled Medications:  acyclovir  400 mg Oral Daily   Chlorhexidine Gluconate Cloth  6 each Topical Daily   insulin aspart  0-15 Units Subcutaneous Q4H   levothyroxine  50 mcg Oral Q0600   oseltamivir  30 mg Oral BID   oseltamivir  75 mg Oral Once   pantoprazole (PROTONIX) IV  40 mg Intravenous Q12H   thiamine (VITAMIN B1) injection  100 mg Intravenous Daily   PRN Medications: sodium chloride, docusate sodium, guaiFENesin, morphine injection, mouth rinse, polyethylene glycol, sodium chloride flush Hemodynamic parameters:   Intake/Output: 01/15 0701 - 01/16 0700 In: 1971.7 [P.O.:750; I.V.:399.5; Blood:420; IV Piggyback:402.3] Out: 1800 [Urine:1800]  Ventilator   Settings:    LAB RESULTS:  Basic Metabolic Panel: Recent Labs  Lab 11/05/22 1113 11/07/22 1253 11/07/22 2302 11/08/22 0425 11/09/22 0420  NA 143 137 139 140 141  K 4.7 5.7* 5.1 4.6 4.1  CL 104 104 104 104 107  CO2 '31 25 27 29 29  '$ GLUCOSE 116* 267* 137* 126* 109*  BUN 35* 56* 59* 57* 47*  CREATININE 0.79 1.03* 1.00 1.07* 1.10*  CALCIUM 8.7* 8.2* 8.0* 8.0* 8.3*  MG  --   --   --  1.5* 1.9  PHOS  --   --   --  4.8* 4.9*    Liver Function Tests: Recent Labs  Lab 11/05/22 1113 11/07/22 1253  AST 17 20  ALT 10 11  ALKPHOS 159* 123  BILITOT 1.4* 1.2  PROT 4.7* 3.7*  ALBUMIN 2.8* 2.1*    No results for input(s): "LIPASE", "AMYLASE" in the last 168 hours. No results for input(s): "AMMONIA" in the last 168 hours. CBC: Recent Labs  Lab 11/05/22 1113 11/07/22 1253 11/07/22 1824 11/07/22 2302 11/08/22 0425 11/08/22 0847 11/08/22 1103 11/08/22 1714 11/08/22 2156 11/09/22 0420  WBC 4.7 6.8  --  9.4 8.2  --   --   --   --  5.8  NEUTROABS 1.6*  --   --   --  2.1  --   --   --   --  1.6*  HGB 7.7* 4.9*   < > 7.1* 6.8*  6.7* 8.2* 7.9* 7.3* 7.1* 7.2*  HCT 26.2* 16.7*   < > 22.4* 21.1*  20.7* 24.3* 23.8* 22.5* 21.8* 22.7*  MCV 92.9 96.5  --  88.5 88.7  --   --   --   --  89.7  PLT 55* 57*  --  55* 64*  --   --   --   --  58*   < > = values in this interval not displayed.    Cardiac Enzymes: No results for input(s): "CKTOTAL", "CKMB", "CKMBINDEX", "TROPONINI" in the last 168 hours. BNP: Invalid input(s): "POCBNP" CBG: Recent Labs  Lab 11/08/22 1945 11/08/22 2334 11/09/22 0408 11/09/22 0803 11/09/22 1126  GLUCAP 184* 92 101* 81 120*        IMAGING RESULTS:  Imaging: CT CHEST ABDOMEN PELVIS W CONTRAST  Result Date: 11/07/2022 CLINICAL DATA:  Sepsis. Dementia. Lymphoma. Hemorrhagic shock requiring blood transfusions and pressor support. * Tracking Code: BO * EXAM: CT CHEST, ABDOMEN, AND PELVIS WITH CONTRAST TECHNIQUE: Multidetector CT imaging of the  chest, abdomen and pelvis was performed following the standard protocol during bolus administration of intravenous contrast. RADIATION DOSE REDUCTION: This exam was performed according to the departmental dose-optimization program which includes automated exposure control, adjustment of the mA and/or kV according to patient size and/or use of iterative reconstruction technique. CONTRAST:  151m OMNIPAQUE IOHEXOL 300 MG/ML  SOLN COMPARISON:  Chest radiograph from earlier today. 09/19/2022 CT abdomen/pelvis. 07/12/2022 chest CT angiogram. FINDINGS: CT CHEST FINDINGS Cardiovascular: Normal heart size. No significant pericardial effusion/thickening. Left anterior descending coronary atherosclerosis. Right internal jugular Port-A-Cath terminates at the cavoatrial junction. Atherosclerotic nonaneurysmal thoracic aorta. Normal caliber pulmonary arteries. No central pulmonary emboli. Mediastinum/Nodes: No significant thyroid nodules. Unremarkable esophagus. Widespread mild to moderate bilateral axillary, bilateral lower neck, bilateral mediastinal and bilateral hilar lymphadenopathy, overall mildly decreased. Representative 1.6 cm right axillary node (series 3/image 23), decreased from 2.0 cm. Representative 1.7 cm right paratracheal node (series 3/image 19), mildly decreased from 1.9 cm. Representative 1.6 cm AP window node (series 3/image 20), stable. Lungs/Pleura: No pneumothorax. Moderate to large right and small to moderate left dependent pleural effusions with mild smooth bilateral pleural thickening and enhancement. New patchy consolidation in the posterior right upper lobe. New prominent patchy tree-in-bud opacities throughout the bilateral upper lobes. Calcified 1.9 cm posterior right upper lobe nodule is stable. Moderate compressive atelectasis in the posterior  lower lobes bilaterally. Musculoskeletal: No aggressive appearing focal osseous lesions. Mild thoracic spondylosis. Mild anasarca. CT ABDOMEN PELVIS  FINDINGS Hepatobiliary: Normal liver with no liver mass. Gallstones within the contracted gallbladder with no definite gallbladder wall thickening. No biliary ductal dilatation. Pancreas: Normal, with no mass or duct dilation. Spleen: Marked splenomegaly measuring 21.5 cm craniocaudal length, previously 19.7 cm, mildly increased. No splenic masses. Adrenals/Urinary Tract: Normal adrenals. Normal kidneys with no hydronephrosis and no renal mass. Well-positioned Foley catheter within the bladder. Expected minimal gas in the nondependent bladder from instrumentation. Chronic mild diffuse bladder wall thickening. Stomach/Bowel: Normal non-distended stomach. Normal caliber small bowel loops. Appendix not discretely visualized. Mild wall thickening in the nondilated terminal ileum and cecum in the right lower quadrant. No additional sites of small or large bowel wall thickening. No significant diverticulosis. Vascular/Lymphatic: Atherosclerotic nonaneurysmal abdominal aorta. Patent portal, splenic, hepatic and renal veins. Widespread moderate lymphadenopathy throughout the retroperitoneum and bilateral pelvis is overall slightly decreased from 09/19/2022 CT. Representative 2.3 cm portacaval node (series 3/image 63), decreased from 2.5 cm. Representative 1.7 cm left para-aortic node (series 3/image 69), decreased from 2.0 cm. Representative 1.8 cm right inguinal node (series 3/image 111), decreased from 2.0 cm. Reproductive: Grossly normal uterus.  No adnexal mass. Other: No pneumoperitoneum. No focal fluid collection. Moderate anasarca. Musculoskeletal: No aggressive appearing focal osseous lesions. Chronic L2 and L3 vertebral compression fractures. Mild-to-moderate lumbar degenerative disc disease. IMPRESSION: 1. Moderate to large right and small to moderate left dependent pleural effusions with mild smooth bilateral pleural thickening and enhancement. 2. New patchy consolidation in the posterior right upper lobe. New  prominent patchy tree-in-bud opacities throughout the bilateral upper lobes. Findings are most compatible with multilobar bronchopneumonia. 3. Widespread lymphadenopathy in the chest, abdomen and pelvis is overall mildly decreased from prior CT imaging. 4. Marked splenomegaly, mildly increased. 5. Mild wall thickening in the nondilated terminal ileum and cecum in the right lower quadrant, nonspecific, cannot exclude third-spacing, ischemia or infectious enterocolitis. No evidence of bowel obstruction. No pneumatosis. No pneumoperitoneum. 6. Moderate anasarca. 7. Cholelithiasis. 8. Chronic mild diffuse bladder wall thickening, nonspecific. 9.  Aortic Atherosclerosis (ICD10-I70.0). Electronically Signed   By: Ilona Sorrel M.D.   On: 11/07/2022 16:31   DG Chest Port 1 View  Result Date: 11/07/2022 CLINICAL DATA:  Dyspnea, vomiting, rectal bleeding, COPD, lymphoma EXAM: PORTABLE CHEST 1 VIEW COMPARISON:  10/27/2022 chest radiograph. FINDINGS: Stable right internal jugular Port-A-Cath terminating over the cavoatrial junction. Stable cardiomediastinal silhouette with mild cardiomegaly. No pneumothorax. Similar small bilateral pleural effusions. Moderate diffuse prominence of the parahilar interstitial markings with patchy bilateral parahilar and bibasilar lung opacities, worsened. IMPRESSION: 1. Mild cardiomegaly. Moderate diffuse prominence of the parahilar interstitial markings with patchy bilateral parahilar and bibasilar lung opacities, worsened, favor congestive heart failure with pulmonary edema. A component of aspiration or pneumonia at the lung bases is not excluded. 2. Similar small bilateral pleural effusions. Electronically Signed   By: Ilona Sorrel M.D.   On: 11/07/2022 14:06   '@PROBHOSP'$ @ No results found.     Resp panel by RT-PCR (RSV, Flu A&B, Covid) Order: [id number] Status: Final result     Visible to patient: Yes (seen)     Next appt: 11/12/2022 at 02:40 PM in Family Medicine [name], NP)   Specimen Information: Nasal Swab  0 Result Notes      Component Ref Range & Units 12:53 2 wk ago  SARS Coronavirus 2 by RT PCR NEGATIVE NEGATIVE NEGATIVE CM  Comment: (NOTE)  SARS-CoV-2 target nucleic acids are NOT DETECTED.  The SARS-CoV-2 RNA is generally detectable in upper respiratory specimens during the acute phase of infection. The lowest concentration of SARS-CoV-2 viral copies this assay can detect is 138 copies/mL. A negative result does not preclude SARS-Cov-2 infection and should not be used as the sole basis for treatment or other patient management decisions. A negative result may occur with improper specimen collection/handling, submission of specimen other than nasopharyngeal swab, presence of viral mutation(s) within the areas targeted by this assay, and inadequate number of viral copies(<138 copies/mL). A negative result must be combined with clinical observations, patient history, and epidemiological information. The expected result is Negative.  Fact Sheet for Patients: EntrepreneurPulse.com.au  Fact Sheet for Healthcare Providers: IncredibleEmployment.be  This test is not yet approved or cleared by the [place] FDA and has been authorized for detection and/or diagnosis of SARS-CoV-2 by FDA under an Emergency Use Authorization (EUA). This EUA will remain in effect (meaning this test can be used) for the duration of the COVID-19 declaration under Section 564(b)(1) of the Act, 21 U.S.C.section 360bbb-3(b)(1), unless the authorization is terminated or revoked sooner.    Influenza A by PCR NEGATIVE POSITIVE Abnormal  POSITIVE Abnormal   Influenza B by PCR NEGATIVE NEGATIVE        ASSESSMENT AND PLAN    -Multidisciplinary rounds held today  Hemorrhagic shock    -s/p PRBC transfusion and IVF   -shock physiology has improved   Acute blood loss anemia -s/p PRBC transfusion  -goal hB >8 -s/p CT abd -  possible infectioous enteritis on scan, marked abdominal lymphadenopathy  Chronic centrilobular emphysema -oxygen as needed -albuterol PRN   Multifocal bilateral pneumonia  - hx of recurrent effusions and thoracentesis  - IR US guided thoracentesis post CT chest   - empiric Rocephin and Zithromax  -influenza A + tamiflu   Chronic diastolic CHF   Patient is edematous   Chronic recurrent pleural effusions   - patient may need repeat thoracentesis   - CT chest pending    Chronic dementia  - 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  ID -continue IV abx as prescibed -follow up cultures  GI/Nutrition GI PROPHYLAXIS as indicated DIET-->TF's as tolerated Constipation protocol as indicated  ENDO - ICU hypoglycemic\Hyperglycemia protocol -check FSBS per protocol   ELECTROLYTES -follow labs as needed -replace as needed -pharmacy consultation   DVT/GI PRX ordered -SCDs  TRANSFUSIONS AS NEEDED MONITOR FSBS ASSESS the need for LABS as needed  Critical care provider statement:   Total critical care time: 33 minutes   Performed by: Lanney Gins MD   Critical care time was exclusive of separately billable procedures and treating other patients.   Critical care was necessary to treat or prevent imminent or life-threatening deterioration.   Critical care was time spent personally by me on the following activities: development of treatment plan with patient and/or surrogate as well as nursing, discussions with consultants, evaluation of patient's response to treatment, examination of patient, obtaining history from patient or surrogate, ordering and performing treatments and interventions, ordering and review of laboratory studies, ordering and review of radiographic studies, pulse oximetry and re-evaluation of patient's condition.    Ottie Glazier, M.D.  Pulmonary & Critical Care Medicine

## 2022-11-09 NOTE — Consult Note (Addendum)
   [facility] Inpatient Consult   11/09/2022  Aleena Kirkeby Cuyler [birth year] [id number]  [facility] [[facility]] Patient: Natasha [name] Hospital Liaison remote coverage review for patient admitted to [facility]    Primary Care Provider:  [name], NP with [facility] which is listed for the Transition of Care [TOC] follow up.  Patient is currently active with [facility] for chronic disease management services.  Patient has been engaged by a [facility] RNCM and [facility] LCSW.  Our community based plan of care has focused on disease management and community resource support.  Reviewed [facility] recent community encounters for ongoing needs.  Notes regarding possible ongoing APS involvement per [facility] LCSW.  If patient returns home, this writer anticipates a post hospital call and will be evaluated for assessments and disease process education.    Plan: No additional needs noted per review of patient's electronic medical record. Continue to follow for needs with any changes for [facility] Coordination. Follow up with [facility] team.   Of note, [facility] Care Management services does not replace or interfere with any services that are needed or arranged by inpatient [facility] care management team.   For additional questions or referrals please contact:  Natividad Brood, RN BSN [place]  [phone] business mobile phone Toll free office [phone]  *[place]  [phone] Fax number: [phone] [email or web address]

## 2022-11-09 NOTE — Progress Notes (Signed)
GI Inpatient Follow-up Note  Subjective:  Patient seen and is stable. No bleeding. No bowel movements.  Scheduled Inpatient Medications:   acyclovir  400 mg Oral Daily   Chlorhexidine Gluconate Cloth  6 each Topical Daily   insulin aspart  0-15 Units Subcutaneous Q4H   levothyroxine  50 mcg Oral Q0600   oseltamivir  30 mg Oral BID   oseltamivir  75 mg Oral Once   pantoprazole (PROTONIX) IV  40 mg Intravenous Q12H   thiamine (VITAMIN B1) injection  100 mg Intravenous Daily    Continuous Inpatient Infusions:    sodium chloride Stopped (11/08/22 2351)   sodium chloride     azithromycin Stopped (11/08/22 1801)   cefTRIAXone (ROCEPHIN)  IV Stopped (11/08/22 1650)   lactated ringers 50 mL/hr at 11/09/22 1300   norepinephrine (LEVOPHED) Adult infusion Stopped (11/09/22 1114)    PRN Inpatient Medications:  sodium chloride, docusate sodium, guaiFENesin, morphine injection, mouth rinse, polyethylene glycol, sodium chloride flush  Review of Systems:  Review of Systems  Constitutional:  Positive for malaise/fatigue.  Respiratory:  Positive for shortness of breath.   Cardiovascular:  Positive for leg swelling.  Gastrointestinal:  Negative for diarrhea, nausea and vomiting.  Skin:  Negative for rash.  Neurological:  Negative for focal weakness.  Psychiatric/Behavioral:  Negative for substance abuse.   All other systems reviewed and are negative.     Physical Examination: BP (!) 102/59   Pulse 84   Temp 98.8 F (37.1 C)   Resp (!) 21   Ht '5\' 4"'$  (1.626 m)   Wt 69.4 kg   SpO2 99%   BMI 26.26 kg/m  Gen: NAD, chronically ill appearing HEENT: PEERLA Neck: supple, no JVD or thyromegaly Chest: Pursed lip breathing Abd: soft, non-distended Ext: 2+ edema Skin: no rash or lesions noted  Data: Lab Results  Component Value Date   WBC 5.8 11/09/2022   HGB 7.2 (L) 11/09/2022   HCT 22.7 (L) 11/09/2022   MCV 89.7 11/09/2022   PLT 58 (L) 11/09/2022   Recent Labs  Lab  11/08/22 1714 11/08/22 2156 11/09/22 0420  HGB 7.3* 7.1* 7.2*   Lab Results  Component Value Date   NA 141 11/09/2022   K 4.1 11/09/2022   CL 107 11/09/2022   CO2 29 11/09/2022   BUN 47 (H) 11/09/2022   CREATININE 1.10 (H) 11/09/2022   Lab Results  Component Value Date   ALT 11 11/07/2022   AST 20 11/07/2022   ALKPHOS 123 11/07/2022   BILITOT 1.2 11/07/2022   Recent Labs  Lab 11/07/22 1254  INR 1.4*   Assessment/Plan: Natasha [name] is a 77 y.o.  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 Differential includes PUD versus malignant bleeding. Patient is very high risk for any endoscopic procedure so will continue to monitor for now, if bleeding recurs will re-evaluate at that time  Recommendations:  - transfuse to keep hemoglobin > 7 - maintain two large bore IV's - continue PPI IV BID - no plan for EGD at this point unless bleeding recurs - recommend palliative care and goals of care discussion given her comorbidities and lymphoma - if recurrent bleeding overnight would recommend CTA - will continue to follow  Please call with any questions or concerns.  Raylene Miyamoto MD, MPH [place]

## 2022-11-09 NOTE — Progress Notes (Signed)
PHARMACY CONSULT NOTE - ELECTROLYTES  Pharmacy Consult for Electrolyte Monitoring and Replacement   Recent Labs: Potassium (mmol/L)  Date Value  11/09/2022 4.1   Magnesium (mg/dL)  Date Value  11/09/2022 1.9   Calcium (mg/dL)  Date Value  11/09/2022 8.3 (L)   Albumin (g/dL)  Date Value  11/07/2022 2.1 (L)  06/22/2022 3.4 (L)   Phosphorus (mg/dL)  Date Value  11/09/2022 4.9 (H)   Sodium (mmol/L)  Date Value  11/09/2022 141  06/22/2022 143   Corrected Ca: 9.8 mg/dL  Assessment  Natasha [name] is a 77 y.o. female presenting with SOB & fever. PMH significant for nodal marginal zone B-cell lymphoma (on [place]), COPD on 3L O2, diabetes mellitusDM, dCHF, hypothyroidism, former smoker, thrombocytopenia, anemia. Pharmacy has been consulted to monitor and replace electrolytes.  Diet: Regular, soft MIVF: LR @ 50 mL/hr  Goal of Therapy: Electrolytes WNL  Plan:  Potassium: 4.6 >> 4.1, no replacement needed Magnesium: 1.5 >> 1.9, no replacement needed Phosphorus: 4.8 >> 4.9, no replacement needed Check BMP, Mg, Phos with AM labs  Thank you for allowing pharmacy to be a part of this patient's care.  Gretel Acre, PharmD PGY1 Pharmacy Resident 11/09/2022 7:12 AM

## 2022-11-10 ENCOUNTER — Other Ambulatory Visit: Payer: Self-pay | Admitting: Oncology

## 2022-11-10 ENCOUNTER — Inpatient Hospital Stay: Payer: 59

## 2022-11-10 ENCOUNTER — Other Ambulatory Visit (HOSPITAL_COMMUNITY): Payer: Self-pay

## 2022-11-10 DIAGNOSIS — C8580 Other specified types of non-Hodgkin lymphoma, unspecified site: Secondary | ICD-10-CM

## 2022-11-10 DIAGNOSIS — Z9889 Other specified postprocedural states: Secondary | ICD-10-CM

## 2022-11-10 DIAGNOSIS — K922 Gastrointestinal hemorrhage, unspecified: Secondary | ICD-10-CM

## 2022-11-10 LAB — BODY FLUID CELL COUNT WITH DIFFERENTIAL
Eos, Fluid: 0 %
Lymphs, Fluid: 88 %
Monocyte-Macrophage-Serous Fluid: 11 %
Neutrophil Count, Fluid: 1 %
Total Nucleated Cell Count, Fluid: 1263 cu mm

## 2022-11-10 LAB — CBC WITH DIFFERENTIAL/PLATELET
Abs Immature Granulocytes: 0.17 10*3/uL — ABNORMAL HIGH (ref 0.00–0.07)
Basophils Absolute: 0 10*3/uL (ref 0.0–0.1)
Basophils Relative: 1 %
Eosinophils Absolute: 0.1 10*3/uL (ref 0.0–0.5)
Eosinophils Relative: 2 %
HCT: 23.5 % — ABNORMAL LOW (ref 36.0–46.0)
Hemoglobin: 7.2 g/dL — ABNORMAL LOW (ref 12.0–15.0)
Immature Granulocytes: 4 %
Lymphocytes Relative: 57 %
Lymphs Abs: 2.4 10*3/uL (ref 0.7–4.0)
MCH: 28.8 pg (ref 26.0–34.0)
MCHC: 30.6 g/dL (ref 30.0–36.0)
MCV: 94 fL (ref 80.0–100.0)
Monocytes Absolute: 0.4 10*3/uL (ref 0.1–1.0)
Monocytes Relative: 10 %
Neutro Abs: 1.1 10*3/uL — ABNORMAL LOW (ref 1.7–7.7)
Neutrophils Relative %: 26 %
Platelets: 59 10*3/uL — ABNORMAL LOW (ref 150–400)
RBC: 2.5 MIL/uL — ABNORMAL LOW (ref 3.87–5.11)
RDW: 18.8 % — ABNORMAL HIGH (ref 11.5–15.5)
WBC: 4.1 10*3/uL (ref 4.0–10.5)
nRBC: 0.5 % — ABNORMAL HIGH (ref 0.0–0.2)

## 2022-11-10 LAB — GLUCOSE, CAPILLARY
Glucose-Capillary: 116 mg/dL — ABNORMAL HIGH (ref 70–99)
Glucose-Capillary: 111 mg/dL — ABNORMAL HIGH (ref 70–99)
Glucose-Capillary: 185 mg/dL — ABNORMAL HIGH (ref 70–99)
Glucose-Capillary: 142 mg/dL — ABNORMAL HIGH (ref 70–99)
Glucose-Capillary: 196 mg/dL — ABNORMAL HIGH (ref 70–99)
Glucose-Capillary: 204 mg/dL — ABNORMAL HIGH (ref 70–99)

## 2022-11-10 LAB — BASIC METABOLIC PANEL
Anion gap: 4 — ABNORMAL LOW (ref 5–15)
BUN: 36 mg/dL — ABNORMAL HIGH (ref 8–23)
CO2: 30 mmol/L (ref 22–32)
Calcium: 8.3 mg/dL — ABNORMAL LOW (ref 8.9–10.3)
Chloride: 106 mmol/L (ref 98–111)
Creatinine, Ser: 0.98 mg/dL (ref 0.44–1.00)
GFR, Estimated: >60 mL/min (ref >60)
Glucose, Bld: 115 mg/dL — ABNORMAL HIGH (ref 70–99)
Potassium: 4 mmol/L (ref 3.5–5.1)
Sodium: 140 mmol/L (ref 135–145)

## 2022-11-10 LAB — HEMOGLOBIN AND HEMATOCRIT, BLOOD
HCT: 22.6 % — ABNORMAL LOW (ref 36.0–46.0)
HCT: 21.4 % — ABNORMAL LOW (ref 36.0–46.0)
Hemoglobin: 7.1 g/dL — ABNORMAL LOW (ref 12.0–15.0)
Hemoglobin: 6.5 g/dL — ABNORMAL LOW (ref 12.0–15.0)

## 2022-11-10 LAB — PHOSPHORUS: Phosphorus: 4.4 mg/dL (ref 2.5–4.6)

## 2022-11-10 LAB — PREPARE RBC (CROSSMATCH)

## 2022-11-10 LAB — GLUCOSE, PLEURAL OR PERITONEAL FLUID: Glucose, Fluid: 162 mg/dL

## 2022-11-10 LAB — MAGNESIUM: Magnesium: 1.8 mg/dL (ref 1.7–2.4)

## 2022-11-10 MED ORDER — AMOXICILLIN-POT CLAVULANATE 875-125 MG PO TABS
1.0000 | ORAL_TABLET | Freq: Two times a day (BID) | ORAL | Status: DC
  Administered 2022-11-10 – 2022-11-12 (×4): 1 via ORAL
  Filled 2022-11-10 (×4): qty 1

## 2022-11-10 MED ORDER — LIDOCAINE HCL (PF) 1 % IJ SOLN
10.0000 mL | Freq: Once | INTRAMUSCULAR | Status: AC
  Administered 2022-11-10: 10 mL via INTRADERMAL

## 2022-11-10 MED ORDER — CALQUENCE 100 MG PO TABS
100.0000 mg | ORAL_TABLET | Freq: Two times a day (BID) | ORAL | 1 refills | Status: DC
  Filled 2022-11-10: qty 60, 30d supply, fill #0

## 2022-11-10 MED ORDER — SODIUM CHLORIDE 0.9% IV SOLUTION: Freq: Once | INTRAVENOUS | Status: AC

## 2022-11-10 MED ORDER — SODIUM CHLORIDE 0.9% IV SOLUTION: Freq: Once | INTRAVENOUS | Status: DC

## 2022-11-10 NOTE — Progress Notes (Signed)
GI Inpatient Follow-up Note  Subjective:  Patient seen and over all stable. Had maroon bowel movement yesterday around 5 but no further bleeding.  Scheduled Inpatient Medications:   sodium chloride   Intravenous Once   acyclovir  400 mg Oral Daily   Chlorhexidine Gluconate Cloth  6 each Topical Daily   insulin aspart  0-15 Units Subcutaneous Q4H   levothyroxine  50 mcg Oral Q0600   oseltamivir  30 mg Oral BID   oseltamivir  75 mg Oral Once   pantoprazole (PROTONIX) IV  40 mg Intravenous Q12H   thiamine (VITAMIN B1) injection  100 mg Intravenous Daily    Continuous Inpatient Infusions:    sodium chloride Stopped (11/09/22 1747)   sodium chloride     azithromycin Stopped (11/09/22 1654)   cefTRIAXone (ROCEPHIN)  IV Stopped (11/09/22 1524)   lactated ringers 50 mL/hr at 11/10/22 0800   norepinephrine (LEVOPHED) Adult infusion Stopped (11/09/22 1114)    PRN Inpatient Medications:  sodium chloride, docusate sodium, guaiFENesin, morphine injection, mouth rinse, polyethylene glycol, sodium chloride flush  Review of Systems:  Review of Systems  Constitutional:  Negative for chills and fever.  Respiratory:  Positive for shortness of breath.   Cardiovascular:  Negative for chest pain.  Gastrointestinal:  Positive for abdominal pain.  Musculoskeletal:  Negative for joint pain.  Neurological:  Negative for focal weakness.  Psychiatric/Behavioral:  Negative for substance abuse.   All other systems reviewed and are negative.     Physical Examination: BP 108/62 (BP Location: Left Arm)   Pulse 85   Temp 97.9 F (36.6 C) (Bladder)   Resp 19   Ht '5\' 4"'$  (1.626 m)   Wt 70.4 kg   SpO2 99%   BMI 26.64 kg/m  Gen: Chronically ill appearing HEENT: PEERLA, EOMI, Neck: supple, no JVD or thyromegaly Chest: No respiratory distress Abd: soft, non-tender, non-distended Ext: 2+ edema Skin: no rash or lesions noted   Data: Lab Results  Component Value Date   WBC 4.1 11/10/2022    HGB 7.2 (L) 11/10/2022   HCT 23.5 (L) 11/10/2022   MCV 94.0 11/10/2022   PLT 59 (L) 11/10/2022   Recent Labs  Lab 11/09/22 1452 11/09/22 2239 11/10/22 0522  HGB 7.1* 7.3* 7.2*   Lab Results  Component Value Date   NA 140 11/10/2022   K 4.0 11/10/2022   CL 106 11/10/2022   CO2 30 11/10/2022   BUN 36 (H) 11/10/2022   CREATININE 0.98 11/10/2022   Lab Results  Component Value Date   ALT 11 11/07/2022   AST 20 11/07/2022   ALKPHOS 123 11/07/2022   BILITOT 1.2 11/07/2022   Recent Labs  Lab 11/07/22 1254  INR 1.4*   Assessment/Plan: Ms. Alig is a 77 y.o. 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 Differential includes PUD, ischemic colitis, and malignant bleeding. Patient is very high risk for any endoscopic procedure. Discussed with patient about risks and benefits including full scope with EGD and potentially colonoscopy versus conservative by watching for any significant recurrent bleeding and patient wishes not to undergo any procedures at this moment. She would reconsider if bleeding recurs. If she does rebleed then it would be beneficial to get a thoracentesis which she doesn't want to get done.  Recommendations:  - transfuse to keep hemoglobin > 7 -  maintain two large bore IV's - continue PPI IV BID - recommend palliative care and goals of care discussion given her comorbidities and lymphoma - if recurrent bleeding would recommend CTA - will continue to follow   Please call with any questions or concerns.  Raylene Miyamoto MD, MPH [place]

## 2022-11-10 NOTE — Progress Notes (Signed)
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  Patient coming in now with hemorrhagic shock requiring blood transfusion with requirement for Levophed.  Pulmonary critical care asked for admission due to shock physiology and acute blood loss anemia.  11/08/22- patient was unable to have GI procedure due to medical instability. She has poor prognosis overall, she relates she does not want to have intubation or CPR.  I will call palliative consultation.  There is plan for thoracentesis.  11/09/22- patient stopped bleeding and is more stable off vasopressors.  11/10/22- patient refusing all care.  Will order palliative consultation and recommendation for hospice.  She does not want thoracentesis for large pleural effusion. She is declining endoluminal evaluation by GI.   Lines/tubes : Urethral Catheter T. [place] Non-latex;Temperature probe 14 Fr. (Active)    Microbiology/Sepsis markers: Results for orders placed or performed during the hospital encounter of 11/07/22  Resp panel by RT-PCR (RSV, Flu A&B, Covid)     Status: Abnormal   Collection Time: 11/07/22 12:53 PM   Specimen: Nasal Swab  Result Value Ref Range Status   SARS Coronavirus 2 by RT PCR NEGATIVE NEGATIVE Final    Comment: (NOTE) SARS-CoV-2 target nucleic acids are NOT DETECTED.  The SARS-CoV-2 RNA is generally detectable in upper  respiratory specimens during the acute phase of infection. The lowest concentration of SARS-CoV-2 viral copies this assay can detect is 138 copies/mL. A negative result does not preclude SARS-Cov-2 infection and should not be used as the sole basis for treatment or other patient management decisions. A negative result may occur with  improper specimen collection/handling, submission of specimen other than nasopharyngeal swab, presence of viral mutation(s) within the areas targeted by this assay, and inadequate number of viral copies(<138 copies/mL). A negative result must be combined with clinical observations, patient history, and epidemiological information. The expected result is Negative.  Fact Sheet for Patients:  EntrepreneurPulse.com.au  Fact Sheet for Healthcare Providers:  IncredibleEmployment.be  This test is no t yet approved or cleared by the [place] FDA and  has been authorized for detection and/or diagnosis of SARS-CoV-2 by FDA under an Emergency Use Authorization (EUA). This EUA will remain  in effect (meaning this test can be used) for the duration of the COVID-19 declaration under Section 564(b)(1) of the Act, 21 U.S.C.section 360bbb-3(b)(1), unless the authorization is terminated  or revoked sooner.       Influenza A by PCR POSITIVE (A) NEGATIVE Final   Influenza B by PCR NEGATIVE NEGATIVE Final    Comment: (NOTE) The Xpert Xpress SARS-CoV-2/FLU/RSV plus assay is intended as an aid in the diagnosis of influenza from Nasopharyngeal swab specimens and should not be used as a sole basis for treatment. Nasal washings and aspirates are unacceptable for Xpert Xpress SARS-CoV-2/FLU/RSV testing.  Fact Sheet for Patients: EntrepreneurPulse.com.au  Fact Sheet for Healthcare Providers: IncredibleEmployment.be  This test is not yet approved or cleared by the [place] FDA and has been  authorized for detection and/or diagnosis of SARS-CoV-2 by FDA under an Emergency Use Authorization (EUA). This EUA will remain in effect (meaning this test can be used)  for the duration of the COVID-19 declaration under Section 564(b)(1) of the Act, 21 U.S.C. section 360bbb-3(b)(1), unless the authorization is terminated or revoked.     Resp Syncytial Virus by PCR NEGATIVE NEGATIVE Final    Comment: (NOTE) Fact Sheet for Patients: EntrepreneurPulse.com.au  Fact Sheet for Healthcare Providers: IncredibleEmployment.be  This test is not yet approved or cleared by the [place] FDA and has been authorized for detection and/or diagnosis of SARS-CoV-2 by FDA under an Emergency Use Authorization (EUA). This EUA will remain in effect (meaning this test can be used) for the duration of the COVID-19 declaration under Section 564(b)(1) of the Act, 21 U.S.C. section 360bbb-3(b)(1), unless the authorization is terminated or revoked.  Performed at [facility], [address] [place], [place] [address]   Respiratory (~20 pathogens) panel by PCR     Status: Abnormal   Collection Time: 11/07/22 12:53 PM   Specimen: Nasopharyngeal Swab; Respiratory  Result Value Ref Range Status   Adenovirus NOT DETECTED NOT DETECTED Final   Coronavirus 229E NOT DETECTED NOT DETECTED Final    Comment: (NOTE) The Coronavirus on the Respiratory Panel, DOES NOT test for the novel  Coronavirus (2019 nCoV)    Coronavirus HKU1 NOT DETECTED NOT DETECTED Final   Coronavirus NL63 NOT DETECTED NOT DETECTED Final   Coronavirus OC43 NOT DETECTED NOT DETECTED Final   Metapneumovirus NOT DETECTED NOT DETECTED Final   Rhinovirus / Enterovirus NOT DETECTED NOT DETECTED Final   Influenza A H1 2009 DETECTED (A) NOT DETECTED Final   Influenza B NOT DETECTED NOT DETECTED Final   Parainfluenza Virus 1 NOT DETECTED NOT DETECTED Final   Parainfluenza Virus 2 NOT DETECTED NOT  DETECTED Final   Parainfluenza Virus 3 NOT DETECTED NOT DETECTED Final   Parainfluenza Virus 4 NOT DETECTED NOT DETECTED Final   Respiratory Syncytial Virus NOT DETECTED NOT DETECTED Final   Bordetella pertussis NOT DETECTED NOT DETECTED Final   Bordetella Parapertussis NOT DETECTED NOT DETECTED Final   Chlamydophila pneumoniae NOT DETECTED NOT DETECTED Final   Mycoplasma pneumoniae NOT DETECTED NOT DETECTED Final    Comment: Performed at [facility], [place]. [address] [place], [place] [address]  MRSA Next Gen by PCR, Nasal     Status: None   Collection Time: 11/07/22  5:17 PM   Specimen: Nasal Mucosa; Nasal Swab  Result Value Ref Range Status   MRSA by PCR Next Gen NOT DETECTED NOT DETECTED Final    Comment: (NOTE) The GeneXpert MRSA Assay (FDA approved for NASAL specimens only), is one component of a comprehensive MRSA colonization surveillance program. It is not intended to diagnose MRSA infection nor to guide or monitor treatment for MRSA infections. Test performance is not FDA approved in patients less than 2 years old. Performed at [facility], [address] [place], [place] [address]   Gastrointestinal Panel by PCR , Stool     Status: None   Collection Time: 11/09/22  5:30 PM   Specimen: Stool  Result Value Ref Range Status   Campylobacter species NOT DETECTED NOT DETECTED Final   Plesimonas shigelloides NOT DETECTED NOT DETECTED Final   Salmonella species NOT DETECTED NOT DETECTED Final   Yersinia enterocolitica NOT DETECTED NOT DETECTED Final   Vibrio species NOT DETECTED NOT DETECTED Final   Vibrio cholerae NOT DETECTED NOT DETECTED Final   Enteroaggregative E coli (EAEC) NOT DETECTED NOT DETECTED Final   Enteropathogenic E coli (EPEC) NOT DETECTED NOT DETECTED Final   Enterotoxigenic E  coli (ETEC) NOT DETECTED NOT DETECTED Final   Shiga like toxin producing E coli (STEC) NOT DETECTED NOT DETECTED Final   Shigella/Enteroinvasive E coli (EIEC) NOT  DETECTED NOT DETECTED Final   Cryptosporidium NOT DETECTED NOT DETECTED Final   Cyclospora cayetanensis NOT DETECTED NOT DETECTED Final   Entamoeba histolytica NOT DETECTED NOT DETECTED Final   Giardia lamblia NOT DETECTED NOT DETECTED Final   Adenovirus F40/41 NOT DETECTED NOT DETECTED Final   Astrovirus NOT DETECTED NOT DETECTED Final   Norovirus GI/GII NOT DETECTED NOT DETECTED Final   Rotavirus A NOT DETECTED NOT DETECTED Final   Sapovirus (I, II, IV, and V) NOT DETECTED NOT DETECTED Final    Comment: Performed at [facility], [address] [place], [place] [address]  C Difficile Quick Screen w PCR reflex     Status: None   Collection Time: 11/09/22  5:30 PM   Specimen: STOOL  Result Value Ref Range Status   C Diff antigen NEGATIVE NEGATIVE Final   C Diff toxin NEGATIVE NEGATIVE Final   C Diff interpretation No C. difficile detected.  Final    Comment: Performed at [facility], [address] [place], [place] [address]    Anti-infectives:  Anti-infectives (From admission, onward)    Start     Dose/Rate Route Frequency Ordered Stop   11/09/22 1000  acyclovir (ZOVIRAX) 200 MG capsule 400 mg        400 mg Oral Daily 11/08/22 1108     11/07/22 2200  oseltamivir (TAMIFLU) capsule 30 mg        30 mg Oral 2 times daily 11/07/22 1531 11/12/22 0959   11/07/22 1545  oseltamivir (TAMIFLU) capsule 75 mg        75 mg Oral  Once 11/07/22 1542     11/07/22 1530  cefTRIAXone (ROCEPHIN) 1 g in sodium chloride 0.9 % 100 mL IVPB        1 g 200 mL/hr over 30 Minutes Intravenous Every 24 hours 11/07/22 1529     11/07/22 1530  azithromycin (ZITHROMAX) 500 mg in sodium chloride 0.9 % 250 mL IVPB        500 mg 250 mL/hr over 60 Minutes Intravenous Every 24 hours 11/07/22 1529          Tests / Events: SURGICAL PATHOLOGY Surgical Pathology CASE: [id number] PATIENT: Vidhi Millikin Flow Pathology Report     Clinical history: marginal zone  lymphoma     DIAGNOSIS:  -Monoclonal B-cell population identified -See comment  COMMENT:  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   CHF (congestive heart failure) ([facility])    COPD (chronic obstructive pulmonary disease) ([place])    Diabetes mellitus without complication ([facility])    Hypotension    Pleural effusion    [facility] 83m,  2 weeks ago   Vaginal delivery    x 5     SURGICAL HISTORY   Past Surgical History:  Procedure Laterality Date   IR IMAGING GUIDED PORT INSERTION  08/16/2022   TONSILLECTOMY Bilateral    as a child     FAMILY HISTORY   Family History  Problem Relation Age of Onset   Breast cancer Mother    Alzheimer's disease Father  Lung cancer Father        lung   Varicose Veins Brother    Heart attack Brother      SOCIAL HISTORY   Social History   Tobacco Use   Smoking status: Former    Packs/day: 2.00    Years: 55.00    Total pack years: 110.00    Types: Cigarettes    Start date: 07/07/1961    Quit date: 08/02/2022    Years since quitting: 0.2   Smokeless tobacco: Never   Tobacco comments:    1/2 pack she states but family says 2 PPD  Vaping Use   Vaping Use: Never used  Substance Use Topics   Alcohol use: No   Drug use: Never     MEDICATIONS   Current Medication:  Current Facility-Administered Medications:    0.9 %  sodium chloride infusion (Manually program via Guardrails IV Fluids), , Intravenous, Once, Rust-Chester, Britton L, NP   0.9 %  sodium chloride infusion, , Intravenous, PRN, Ottie Glazier, MD, Stopped at 11/09/22 1747   0.9 %  sodium chloride infusion, 250 mL, Intravenous, Continuous, Vallery Sa D, RPH   acyclovir (ZOVIRAX) 200 MG capsule 400 mg, 400 mg, Oral, Daily, Dallie Piles,  RPH, 400 mg at 11/10/22 0912   azithromycin (ZITHROMAX) 500 mg in sodium chloride 0.9 % 250 mL IVPB, 500 mg, Intravenous, Q24H, Brayon Bielefeld, MD, Stopped at 11/09/22 1654   cefTRIAXone (ROCEPHIN) 1 g in sodium chloride 0.9 % 100 mL IVPB, 1 g, Intravenous, Q24H, Ottie Glazier, MD, Stopped at 11/09/22 1524   Chlorhexidine Gluconate Cloth 2 % PADS 6 each, 6 each, Topical, Daily, Jacarius Handel, MD, 6 each at 11/10/22 0913   docusate sodium (COLACE) capsule 100 mg, 100 mg, Oral, BID PRN, Lanney Gins, Samantha Olivera, MD   guaiFENesin (ROBITUSSIN) 100 MG/5ML liquid 5 mL, 5 mL, Oral, Q4H PRN, Teressa Lower, NP, 5 mL at 11/09/22 2219   insulin aspart (novoLOG) injection 0-15 Units, 0-15 Units, Subcutaneous, Q4H, Teressa Lower, NP, 3 Units at 11/09/22 2342   lactated ringers infusion, , Intravenous, Continuous, Teressa Lower, NP, Last Rate: 50 mL/hr at 11/10/22 1000, Infusion Verify at 11/10/22 1000   levothyroxine (SYNTHROID) tablet 50 mcg, 50 mcg, Oral, Q0600, Dallie Piles, RPH, 50 mcg at 11/10/22 0529   morphine (PF) 2 MG/ML injection 1 mg, 1 mg, Intravenous, Q3H PRN, Ottie Glazier, MD, 1 mg at 11/09/22 2219   norepinephrine (LEVOPHED) '4mg'$  in 265m (0.016 mg/mL) premix infusion, 2-10 mcg/min, Intravenous, Titrated, GDallie Piles RPH, Stopped at 11/09/22 1114   Oral care mouth rinse, 15 mL, Mouth Rinse, PRN, AOttie Glazier MD   oseltamivir (TAMIFLU) capsule 30 mg, 30 mg, Oral, BID, ALanney Gins Elizah Mierzwa, MD, 30 mg at 11/10/22 04680  oseltamivir (TAMIFLU) capsule 75 mg, 75 mg, Oral, Once, Neely Cecena, MD   pantoprazole (PROTONIX) injection 40 mg, 40 mg, Intravenous, Q12H, Timtohy Broski, MD, 40 mg at 11/10/22 0912   polyethylene glycol (MIRALAX / GLYCOLAX) packet 17 g, 17 g, Oral, Daily PRN, ALanney Gins Dellas Guard, MD   sodium chloride flush (NS) 0.9 % injection 10-40 mL, 10-40 mL, Intracatheter, PRN, ALanney Gins Augustine Leverette, MD, 10 mL at 11/07/22 1954   thiamine (VITAMIN B1) injection 100 mg, 100 mg, Intravenous,  Daily, ALanney Gins Tomiko Schoon, MD, 100 mg at 11/10/22 0912    ALLERGIES   Patient has no known allergies.    REVIEW OF SYSTEMS     10 point ROS conducted is negative except for back pain and  weakness  PHYSICAL EXAMINATION   Vital Signs: Temp:  [97.7 F (36.5 C)-99.1 F (37.3 C)] 97.9 F (36.6 C) (01/17 0900) Pulse Rate:  [80-107] 84 (01/17 0900) Resp:  [12-27] 19 (01/17 0900) BP: (88-113)/(52-71) 103/54 (01/17 0900) SpO2:  [74 %-100 %] 99 % (01/17 0900) FiO2 (%):  [4 %] 4 % (01/16 1900) Weight:  [70.4 kg] 70.4 kg (01/17 0446)  GENERAL: Age-appropriate mild distress due to acutely ill status HEAD: Normocephalic, atraumatic.  EYES: Pupils equal, round, reactive to light.  No scleral icterus.  MOUTH: Moist mucosal membrane. NECK: Supple. No thyromegaly. No nodules. No JVD.  PULMONARY: Decreased breath sounds bilaterally CARDIOVASCULAR: S1 and S2. Regular rate and rhythm. No murmurs, rubs, or gallops.  GASTROINTESTINAL: Soft, nontender, non-distended. No masses. Positive bowel sounds. No hepatosplenomegaly.  MUSCULOSKELETAL: No swelling, clubbing, or edema.  NEUROLOGIC: Mild distress due to acute illness SKIN:intact,warm,dry   PERTINENT DATA     Infusions:  sodium chloride Stopped (11/09/22 1747)   sodium chloride     azithromycin Stopped (11/09/22 1654)   cefTRIAXone (ROCEPHIN)  IV Stopped (11/09/22 1524)   lactated ringers 50 mL/hr at 11/10/22 1000   norepinephrine (LEVOPHED) Adult infusion Stopped (11/09/22 1114)   Scheduled Medications:  sodium chloride   Intravenous Once   acyclovir  400 mg Oral Daily   Chlorhexidine Gluconate Cloth  6 each Topical Daily   insulin aspart  0-15 Units Subcutaneous Q4H   levothyroxine  50 mcg Oral Q0600   oseltamivir  30 mg Oral BID   oseltamivir  75 mg Oral Once   pantoprazole (PROTONIX) IV  40 mg Intravenous Q12H   thiamine (VITAMIN B1) injection  100 mg Intravenous Daily   PRN Medications: sodium chloride, docusate sodium,  guaiFENesin, morphine injection, mouth rinse, polyethylene glycol, sodium chloride flush Hemodynamic parameters:   Intake/Output: 01/16 0701 - 01/17 0700 In: 1928.2 [P.O.:550; I.V.:1027.6; IV Piggyback:350.6] Out: 1475 [Urine:1475]  Ventilator  Settings: FiO2 (%):  [4 %] 4 %  LAB RESULTS:  Basic Metabolic Panel: Recent Labs  Lab 11/07/22 1253 11/07/22 2302 11/08/22 0425 11/09/22 0420 11/10/22 0522  NA 137 139 140 141 140  K 5.7* 5.1 4.6 4.1 4.0  CL 104 104 104 107 106  CO2 '25 27 29 29 30  '$ GLUCOSE 267* 137* 126* 109* 115*  BUN 56* 59* 57* 47* 36*  CREATININE 1.03* 1.00 1.07* 1.10* 0.98  CALCIUM 8.2* 8.0* 8.0* 8.3* 8.3*  MG  --   --  1.5* 1.9 1.8  PHOS  --   --  4.8* 4.9* 4.4    Liver Function Tests: Recent Labs  Lab 11/05/22 1113 11/07/22 1253  AST 17 20  ALT 10 11  ALKPHOS 159* 123  BILITOT 1.4* 1.2  PROT 4.7* 3.7*  ALBUMIN 2.8* 2.1*    No results for input(s): "LIPASE", "AMYLASE" in the last 168 hours. No results for input(s): "AMMONIA" in the last 168 hours. CBC: Recent Labs  Lab 11/05/22 1113 11/07/22 1253 11/07/22 1824 11/07/22 2302 11/08/22 0425 11/08/22 0847 11/08/22 2156 11/09/22 0420 11/09/22 1452 11/09/22 2239 11/10/22 0522  WBC 4.7 6.8  --  9.4 8.2  --   --  5.8  --   --  4.1  NEUTROABS 1.6*  --   --   --  2.1  --   --  1.6*  --   --  1.1*  HGB 7.7* 4.9*   < > 7.1* 6.8*  6.7*   < > 7.1* 7.2* 7.1* 7.3* 7.2*  HCT 26.2*  16.7*   < > 22.4* 21.1*  20.7*   < > 21.8* 22.7* 21.8* 22.7* 23.5*  MCV 92.9 96.5  --  88.5 88.7  --   --  89.7  --   --  94.0  PLT 55* 57*  --  55* 64*  --   --  58*  --  49* 59*   < > = values in this interval not displayed.    Cardiac Enzymes: No results for input(s): "CKTOTAL", "CKMB", "CKMBINDEX", "TROPONINI" in the last 168 hours. BNP: Invalid input(s): "POCBNP" CBG: Recent Labs  Lab 11/09/22 1738 11/09/22 1958 11/09/22 2333 11/10/22 0422 11/10/22 0820  GLUCAP 165* 147* 199* 116* 111*         IMAGING RESULTS:  Imaging: No results found. '@PROBHOSP'$ @ No results found.     Resp panel by RT-PCR (RSV, Flu A&B, Covid) Order: [id number] Status: Final result     Visible to patient: Yes (seen)     Next appt: 11/12/2022 at 02:40 PM in Family Medicine [name], NP)   Specimen Information: Nasal Swab  0 Result Notes      Component Ref Range & Units 12:53 2 wk ago  SARS Coronavirus 2 by RT PCR NEGATIVE NEGATIVE NEGATIVE CM  Comment: (NOTE) SARS-CoV-2 target nucleic acids are NOT DETECTED.  The SARS-CoV-2 RNA is generally detectable in upper respiratory specimens during the acute phase of infection. The lowest concentration of SARS-CoV-2 viral copies this assay can detect is 138 copies/mL. A negative result does not preclude SARS-Cov-2 infection and should not be used as the sole basis for treatment or other patient management decisions. A negative result may occur with improper specimen collection/handling, submission of specimen other than nasopharyngeal swab, presence of viral mutation(s) within the areas targeted by this assay, and inadequate number of viral copies(<138 copies/mL). A negative result must be combined with clinical observations, patient history, and epidemiological information. The expected result is Negative.  Fact Sheet for Patients: EntrepreneurPulse.com.au  Fact Sheet for Healthcare Providers: IncredibleEmployment.be  This test is not yet approved or cleared by the [place] FDA and has been authorized for detection and/or diagnosis of SARS-CoV-2 by FDA under an Emergency Use Authorization (EUA). This EUA will remain in effect (meaning this test can be used) for the duration of the COVID-19 declaration under Section 564(b)(1) of the Act, 21 U.S.C.section 360bbb-3(b)(1), unless the authorization is terminated or revoked sooner.    Influenza A by PCR NEGATIVE POSITIVE Abnormal  POSITIVE  Abnormal   Influenza B by PCR NEGATIVE NEGATIVE        ASSESSMENT AND PLAN    -Multidisciplinary rounds held today  Hemorrhagic shock    -s/p PRBC transfusion and IVF   -shock physiology has improved   Acute blood loss anemia -s/p PRBC transfusion  -goal hB >8 -s/p CT abd - possible infectioous enteritis on scan, marked abdominal lymphadenopathy  Chronic centrilobular emphysema -oxygen as needed -albuterol PRN   Multifocal bilateral pneumonia  - hx of recurrent effusions and thoracentesis  - IR US guided thoracentesis post CT chest   - empiric Rocephin and Zithromax  -influenza A + tamiflu   Chronic diastolic CHF   Patient is edematous   Chronic recurrent pleural effusions   - patient may need repeat thoracentesis   - CT chest pending    Chronic dementia  - 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  ID -continue IV abx as prescibed -follow up cultures  GI/Nutrition GI PROPHYLAXIS as indicated DIET-->TF's as tolerated Constipation protocol  as indicated  ENDO - ICU hypoglycemic\Hyperglycemia protocol -check FSBS per protocol   ELECTROLYTES -follow labs as needed -replace as needed -pharmacy consultation   DVT/GI PRX ordered -SCDs  TRANSFUSIONS AS NEEDED MONITOR FSBS ASSESS the need for LABS as needed  Critical care provider statement:   Total critical care time: 33 minutes   Performed by: Lanney Gins MD   Critical care time was exclusive of separately billable procedures and treating other patients.   Critical care was necessary to treat or prevent imminent or life-threatening deterioration.   Critical care was time spent personally by me on the following activities: development of treatment plan with patient and/or surrogate as well as nursing, discussions with consultants, evaluation of patient's response to treatment, examination of patient, obtaining history from patient or surrogate, ordering and performing treatments and interventions,  ordering and review of laboratory studies, ordering and review of radiographic studies, pulse oximetry and re-evaluation of patient's condition.    Ottie Glazier, M.D.  Pulmonary & Critical Care Medicine

## 2022-11-10 NOTE — Procedures (Signed)
PROCEDURE SUMMARY:  Successful image-guided right thoracentesis. Yielded 1.2 L of hazy amber fluid. Pt tolerated procedure well. No immediate complications. EBL = trace   Specimen was sent for labs. CXR ordered.  Please see imaging section of Epic for full dictation.  Lura Em PA-C 11/10/2022 4:08 PM

## 2022-11-10 NOTE — Progress Notes (Signed)
PHARMACY CONSULT NOTE - ELECTROLYTES  Pharmacy Consult for Electrolyte Monitoring and Replacement   Recent Labs: Potassium (mmol/L)  Date Value  11/10/2022 4.0   Magnesium (mg/dL)  Date Value  11/10/2022 1.8   Calcium (mg/dL)  Date Value  11/10/2022 8.3 (L)   Albumin (g/dL)  Date Value  11/07/2022 2.1 (L)  06/22/2022 3.4 (L)   Phosphorus (mg/dL)  Date Value  11/10/2022 4.4   Sodium (mmol/L)  Date Value  11/10/2022 140  06/22/2022 143   Corrected Ca: 9.8 mg/dL  Assessment  Natasha [name] is a 77 y.o. female presenting with SOB & fever. PMH significant for nodal marginal zone B-cell lymphoma (on [place]), COPD on 3L O2, diabetes mellitusDM, dCHF, hypothyroidism, former smoker, thrombocytopenia, anemia. Pharmacy has been consulted to monitor and replace electrolytes.  Diet: Regular, soft MIVF: LR @ 50 mL/hr  Goal of Therapy: Electrolytes WNL  Plan:  No replacement needed this morning Check BMP, Mg, Phos with AM labs  Thank you for allowing pharmacy to be a part of this patient's care.  Gretel Acre, PharmD PGY1 Pharmacy Resident 11/10/2022 7:54 AM

## 2022-11-11 ENCOUNTER — Other Ambulatory Visit (HOSPITAL_COMMUNITY): Payer: Self-pay

## 2022-11-11 DIAGNOSIS — D62 Acute posthemorrhagic anemia: Secondary | ICD-10-CM | POA: Diagnosis not present

## 2022-11-11 DIAGNOSIS — C8300 Small cell B-cell lymphoma, unspecified site: Secondary | ICD-10-CM

## 2022-11-11 DIAGNOSIS — R578 Other shock: Secondary | ICD-10-CM | POA: Diagnosis not present

## 2022-11-11 DIAGNOSIS — J09X1 Influenza due to identified novel influenza A virus with pneumonia: Secondary | ICD-10-CM | POA: Diagnosis not present

## 2022-11-11 DIAGNOSIS — E119 Type 2 diabetes mellitus without complications: Secondary | ICD-10-CM

## 2022-11-11 DIAGNOSIS — E039 Hypothyroidism, unspecified: Secondary | ICD-10-CM

## 2022-11-11 DIAGNOSIS — Z515 Encounter for palliative care: Secondary | ICD-10-CM | POA: Diagnosis not present

## 2022-11-11 DIAGNOSIS — Z7189 Other specified counseling: Secondary | ICD-10-CM | POA: Diagnosis not present

## 2022-11-11 DIAGNOSIS — J9611 Chronic respiratory failure with hypoxia: Secondary | ICD-10-CM

## 2022-11-11 DIAGNOSIS — J90 Pleural effusion, not elsewhere classified: Secondary | ICD-10-CM

## 2022-11-11 DIAGNOSIS — D696 Thrombocytopenia, unspecified: Secondary | ICD-10-CM

## 2022-11-11 DIAGNOSIS — Z794 Long term (current) use of insulin: Secondary | ICD-10-CM

## 2022-11-11 LAB — BPAM RBC
Blood Product Expiration Date: 202401242359
Blood Product Expiration Date: 202402032359
Blood Product Expiration Date: 202402032359
Blood Product Expiration Date: 202402122359
ISSUE DATE / TIME: 202401141356
ISSUE DATE / TIME: 202401141816
ISSUE DATE / TIME: 202401150502
ISSUE DATE / TIME: 202401172321
Unit Type and Rh: 5100
Unit Type and Rh: 5100
Unit Type and Rh: 5100
Unit Type and Rh: 5100

## 2022-11-11 LAB — BPAM PLATELET PHERESIS
Blood Product Expiration Date: 202401192359
ISSUE DATE / TIME: 202401171049
Unit Type and Rh: 6200

## 2022-11-11 LAB — PREPARE PLATELET PHERESIS: Unit division: 0

## 2022-11-11 LAB — CBC
HCT: 25.5 % — ABNORMAL LOW (ref 36.0–46.0)
Hemoglobin: 7.9 g/dL — ABNORMAL LOW (ref 12.0–15.0)
MCH: 27.9 pg (ref 26.0–34.0)
MCHC: 31 g/dL (ref 30.0–36.0)
MCV: 90.1 fL (ref 80.0–100.0)
Platelets: 71 10*3/uL — ABNORMAL LOW (ref 150–400)
RBC: 2.83 MIL/uL — ABNORMAL LOW (ref 3.87–5.11)
RDW: 22.5 % — ABNORMAL HIGH (ref 11.5–15.5)
WBC: 3.8 10*3/uL — ABNORMAL LOW (ref 4.0–10.5)
nRBC: 0.5 % — ABNORMAL HIGH (ref 0.0–0.2)

## 2022-11-11 LAB — HEMOGLOBIN AND HEMATOCRIT, BLOOD
HCT: 27.5 % — ABNORMAL LOW (ref 36.0–46.0)
HCT: 24.9 % — ABNORMAL LOW (ref 36.0–46.0)
HCT: 28.2 % — ABNORMAL LOW (ref 36.0–46.0)
Hemoglobin: 8.4 g/dL — ABNORMAL LOW (ref 12.0–15.0)
Hemoglobin: 7.6 g/dL — ABNORMAL LOW (ref 12.0–15.0)
Hemoglobin: 8.8 g/dL — ABNORMAL LOW (ref 12.0–15.0)

## 2022-11-11 LAB — GLUCOSE, CAPILLARY
Glucose-Capillary: 113 mg/dL — ABNORMAL HIGH (ref 70–99)
Glucose-Capillary: 106 mg/dL — ABNORMAL HIGH (ref 70–99)
Glucose-Capillary: 169 mg/dL — ABNORMAL HIGH (ref 70–99)
Glucose-Capillary: 67 mg/dL — ABNORMAL LOW (ref 70–99)
Glucose-Capillary: 94 mg/dL (ref 70–99)
Glucose-Capillary: 157 mg/dL — ABNORMAL HIGH (ref 70–99)

## 2022-11-11 LAB — TYPE AND SCREEN
ABO/RH(D): O POS
Antibody Screen: NEGATIVE
Unit division: 0
Unit division: 0
Unit division: 0
Unit division: 0

## 2022-11-11 LAB — BASIC METABOLIC PANEL
Anion gap: 7 (ref 5–15)
BUN: 29 mg/dL — ABNORMAL HIGH (ref 8–23)
CO2: 31 mmol/L (ref 22–32)
Calcium: 8.1 mg/dL — ABNORMAL LOW (ref 8.9–10.3)
Chloride: 105 mmol/L (ref 98–111)
Creatinine, Ser: 0.97 mg/dL (ref 0.44–1.00)
GFR, Estimated: >60 mL/min (ref >60)
Glucose, Bld: 93 mg/dL (ref 70–99)
Potassium: 4.2 mmol/L (ref 3.5–5.1)
Sodium: 143 mmol/L (ref 135–145)

## 2022-11-11 LAB — MAGNESIUM: Magnesium: 1.7 mg/dL (ref 1.7–2.4)

## 2022-11-11 LAB — PHOSPHORUS: Phosphorus: 3.7 mg/dL (ref 2.5–4.6)

## 2022-11-11 MED ORDER — MIDODRINE HCL 5 MG PO TABS
10.0000 mg | ORAL_TABLET | Freq: Three times a day (TID) | ORAL | Status: DC
  Administered 2022-11-11 – 2022-11-12 (×5): 10 mg via ORAL
  Filled 2022-11-11 (×5): qty 2

## 2022-11-11 MED ORDER — MAGNESIUM SULFATE 2 GM/50ML IV SOLN
2.0000 g | Freq: Once | INTRAVENOUS | Status: AC
  Administered 2022-11-11: 2 g via INTRAVENOUS
  Filled 2022-11-11: qty 50

## 2022-11-11 NOTE — Consult Note (Signed)
Hematology/Oncology Consult note [facility] [phone] [phone]) [phone]  Patient Care Team: [name], NP as PCP - General (Nurse Practitioner) [name], RN (Inactive) as Case Manager [name], RN as [place] Management   Name of the patient: Natasha [name]  [id number]  [birth year]    Reason for consult: Stage IV nodal marginal zone lymphoma   Requesting physician: Dr. 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  Patient has been on acalabrutinib for her lymphoma for the last 3 months.  Her treatment course has been complicated by recurrent hospitalizations almost every 3 to 4 weeks secondary to pleural effusions from lymphoma, COVID or flu.  This time patient was admitted for acute upper GI bleed and was found to have a hemoglobin of 4.6.  It has improved to 7.7 after multiple units of blood transfusion.  GI was consulted and given her multiple comorbidities it was deemed to be high risk for her to undergo any procedure.  Conservative treatment has been followed.   ECOG PS- 3  Pain scale- 0   Review of systems- Review of Systems  Constitutional:  Positive for malaise/fatigue. Negative for chills, fever and weight loss.  HENT:  Negative for congestion, ear discharge and nosebleeds.   Eyes:  Negative for blurred vision.  Respiratory:  Positive for shortness of breath. Negative for cough, hemoptysis, sputum production and wheezing.   Cardiovascular:  Negative for chest pain, palpitations, orthopnea and claudication.  Gastrointestinal:  Negative for abdominal pain, blood in stool, constipation, diarrhea, heartburn, melena, nausea and vomiting.   Genitourinary:  Negative for dysuria, flank pain, frequency, hematuria and urgency.  Musculoskeletal:  Negative for back pain, joint pain and myalgias.  Skin:  Negative for rash.  Neurological:  Negative for dizziness, tingling, focal weakness, seizures, weakness and headaches.  Endo/Heme/Allergies:  Does not bruise/bleed easily.  Psychiatric/Behavioral:  Negative for depression and suicidal ideas. The patient does not have insomnia.     No Known Allergies  Patient Active Problem List   Diagnosis Date Noted   Acute blood loss anemia 11/11/2022   Hemorrhagic shock ([place]) 11/07/2022   Overweight (BMI 25.0-29.9) 10/21/2022   AKI (acute kidney injury) ([place]) 10/21/2022   Anemia associated with chemotherapy 10/21/2022   Influenza A with pneumonia 10/20/2022   Neutropenic fever ([place]) 10/20/2022   Chronic diastolic CHF (congestive heart failure) ([place]) 10/20/2022   Myocardial injury 10/20/2022   Type II diabetes mellitus with renal manifestations ([place]) 10/20/2022   Iron deficiency anemia 09/19/2022   COPD with acute exacerbation ([place]) 08/29/2022   Type 2 diabetes mellitus without complication, with long-term current use of insulin ([place]) 08/29/2022   Normocytic anemia 08/29/2022   Severe sepsis ([place]) 08/29/2022   Chronic respiratory failure with hypoxia ([place]) 08/16/2022   Acute on chronic anemia 08/02/2022   Acute on chronic congestive heart failure ([place]) 08/02/2022   Nodal marginal zone B-cell lymphoma ([place]) 07/28/2022   Swelling of lower extremity 07/24/2022   Hypotension 07/24/2022   Palliative care encounter    COVID-19 virus infection 07/14/2022   Acute on chronic diastolic CHF (congestive heart failure) ([place]) 07/14/2022   Anemia of chronic disease 07/14/2022   Thrombocytopenia ([place]) 07/14/2022   Hypothyroidism, unspecified 07/14/2022   Chronic kidney disease, stage 3a (  [place]) 07/14/2022   Bilateral pleural effusion 07/12/2022   Diabetes mellitus, type II ([place]) 07/12/2022   Acute  on chronic respiratory failure with hypoxia and hypercarbia ([place]) 07/12/2022   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/12/2022   Lower extremity edema 05/12/2022   Other pancytopenia ([place]) 04/16/2022   Type 2 diabetes mellitus with proteinuria ([place]) 04/09/2022   Atherosclerosis of aorta ([place]) 04/09/2022   Chronic obstructive pulmonary disease (COPD) (HCC) 04/09/2022   SOB (shortness of breath) on exertion 04/09/2022   Acute cough 04/09/2022   Cigarette nicotine dependence, uncomplicated 40/97/3532   Post herpetic neuralgia 03/14/2020   Gall stones 10/23/2019   Lymphoma ([place]) 10/23/2019   Generalized lymphadenopathy 09/08/2018   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[place]) 09/08/2018   Primary osteoarthritis of both knees 07/07/2018   Senile purpura ([place]) 07/07/2018     Past Medical History:  Diagnosis Date   Anemia    Cancer ([place])    lymphoma-stomach    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   CHF (congestive heart failure) (HCC)    COPD (chronic obstructive pulmonary disease) ([place])    Diabetes mellitus without complication ([place])    Hypotension    Pleural effusion    ARMc 826m,  2 weeks ago   Vaginal delivery    x 5     Past Surgical History:  Procedure Laterality Date   IR IMAGING GUIDED PORT INSERTION  08/16/2022   TONSILLECTOMY Bilateral    as a child    Social History   Socioeconomic History   Marital status: Widowed    Spouse name: Not on file   Number of children: 2   Years of education: Not on file   Highest education level: 9th grade  Occupational History   Occupation: unemployed  Tobacco Use   Smoking status: Former    Packs/day: 2.00    Years: 55.00    Total pack years: 110.00    Types: Cigarettes    Start date: 07/07/1961    Quit date: 08/02/2022    Years since quitting: 0.2   Smokeless tobacco: Never   Tobacco comments:    1/2 pack she states but family says 2 PPD  Vaping Use   Vaping Use: Never used  Substance and Sexual Activity   Alcohol use: No   Drug use:  Never   Sexual activity: Not Currently    Partners: Male    Birth control/protection: Post-menopausal  Other Topics Concern   Not on file  Social History Narrative   08/14/20   [place]  Living: to be near daughter   Work: retired      FPhysiological scientistchildren - JIT consultant(nearby) and son JEvelena Peat[place]  8 grandchildren, & 2 great-grandchildren.      Enjoys: stays at home      Exercise: walking to her daughter's store   Diet: eats fruit, air fried chicken      Safety   Seat belts: Yes    Guns: No   Safe in relationships: Yes    Social Determinants of Health   Financial Resource Strain: Medium Risk (08/24/2022)   Overall Financial Resource Strain (CARDIA)    Difficulty of Paying Living Expenses: Somewhat hard  Food Insecurity: No Food Insecurity (11/08/2022)   Hunger Vital Sign    Worried About Running Out of Food in the Last Year: Never true    Ran Out of Food in the Last Year: Never true  Recent Concern: [place] (08/30/2022)   Hunger  Vital Sign    Worried About Charity fundraiser in the Last Year: Sometimes true    Ran Out of Food in the Last Year: Sometimes true  Transportation Needs: No Transportation Needs (11/08/2022)   PRAPARE - Hydrologist (Medical): No    Lack of Transportation (Non-Medical): No  Recent Concern: Transportation Needs - Unmet Transportation Needs (08/30/2022)   PRAPARE - Hydrologist (Medical): Yes    Lack of Transportation (Non-Medical): No  Physical Activity: Inactive (08/24/2022)   Exercise Vital Sign    Days of Exercise per Week: 0 days    Minutes of Exercise per Session: 0 min  Stress: Stress Concern Present (08/24/2022)   Day    Feeling of Stress : To some extent  Social Connections: Socially Isolated (08/24/2022)   Social Connection and Isolation Panel [NHANES]    Frequency of  Communication with Friends and Family: More than three times a week    Frequency of Social Gatherings with Friends and Family: More than three times a week    Attends Religious Services: Never    Marine scientist or Organizations: No    Attends Archivist Meetings: Never    Marital Status: Widowed  Intimate Partner Violence: Not At Risk (11/08/2022)   Humiliation, Afraid, Rape, and Kick questionnaire    Fear of Current or Ex-Partner: No    Emotionally Abused: No    Physically Abused: No    Sexually Abused: No     Family History  Problem Relation Age of Onset   Breast cancer Mother    Alzheimer's disease Father    Lung cancer Father        lung   Varicose Veins Brother    Heart attack Brother      Current Facility-Administered Medications:    0.9 %  sodium chloride infusion (Manually program via Guardrails IV Fluids), , Intravenous, Once, Rust-Chester, Britton L, NP   0.9 %  sodium chloride infusion, , Intravenous, PRN, Ottie Glazier, MD, Stopped at 11/09/22 1747   0.9 %  sodium chloride infusion, 250 mL, Intravenous, Continuous, Dallie Piles, RPH   acyclovir (ZOVIRAX) 200 MG capsule 400 mg, 400 mg, Oral, Daily, Dallie Piles, RPH, 400 mg at 11/11/22 0950   amoxicillin-clavulanate (AUGMENTIN) 875-125 MG per tablet 1 tablet, 1 tablet, Oral, Q12H, Ottie Glazier, MD, 1 tablet at 11/11/22 0950   Chlorhexidine Gluconate Cloth 2 % PADS 6 each, 6 each, Topical, Daily, Aleskerov, Fuad, MD, 6 each at 11/11/22 1342   docusate sodium (COLACE) capsule 100 mg, 100 mg, Oral, BID PRN, Lanney Gins, Fuad, MD   guaiFENesin (ROBITUSSIN) 100 MG/5ML liquid 5 mL, 5 mL, Oral, Q4H PRN, Teressa Lower, NP, 5 mL at 11/09/22 2219   insulin aspart (novoLOG) injection 0-15 Units, 0-15 Units, Subcutaneous, Q4H, Teressa Lower, NP, 3 Units at 11/11/22 1341   levothyroxine (SYNTHROID) tablet 50 mcg, 50 mcg, Oral, Q0600, Dallie Piles, RPH, 50 mcg at 11/11/22 4193   midodrine (PROAMATINE)  tablet 10 mg, 10 mg, Oral, TID WC, Wieting, Richard, MD, 10 mg at 11/11/22 1731   morphine (PF) 2 MG/ML injection 1 mg, 1 mg, Intravenous, Q3H PRN, Ottie Glazier, MD, 1 mg at 11/09/22 2219   Oral care mouth rinse, 15 mL, Mouth Rinse, PRN, Lanney Gins, Fuad, MD   oseltamivir (TAMIFLU) capsule 30 mg, 30 mg, Oral, BID, Ottie Glazier, MD, 30 mg  at 11/11/22 0950   oseltamivir (TAMIFLU) capsule 75 mg, 75 mg, Oral, Once, Ottie Glazier, MD   pantoprazole (PROTONIX) injection 40 mg, 40 mg, Intravenous, Q12H, Lanney Gins, Fuad, MD, 40 mg at 11/11/22 0949   polyethylene glycol (MIRALAX / GLYCOLAX) packet 17 g, 17 g, Oral, Daily PRN, Lanney Gins, Fuad, MD   sodium chloride flush (NS) 0.9 % injection 10-40 mL, 10-40 mL, Intracatheter, PRN, Ottie Glazier, MD, 10 mL at 11/07/22 1954   thiamine (VITAMIN B1) injection 100 mg, 100 mg, Intravenous, Daily, Ottie Glazier, MD, 100 mg at 11/11/22 0949   Physical exam:  Vitals:   11/11/22 1300 11/11/22 1400 11/11/22 1500 11/11/22 1618  BP: 113/60 (!) 117/91 98/60 110/60  Pulse: 75 81 78 76  Resp: '12 17 16 20  '$ Temp: 98.2 F (36.8 C) 98.2 F (36.8 C) 98.1 F (36.7 C) 98 F (36.7 C)  TempSrc:    Oral  SpO2: 100% 100% 100% 100%  Weight:      Height:       Physical Exam Constitutional:      Comments: Chronically ill-appearing  Cardiovascular:     Rate and Rhythm: Normal rate and regular rhythm.     Heart sounds: Normal heart sounds.  Pulmonary:     Comments: On O2. Breath sounds decreased over bases Abdominal:     Comments: Palpable splenomegaly  Skin:    General: Skin is warm and dry.  Neurological:     Mental Status: She is alert and oriented to person, place, and time.           Latest Ref Rng & Units 11/11/2022    5:00 AM  CMP  Glucose 70 - 99 mg/dL 93   BUN 8 - 23 mg/dL 29   Creatinine 0.44 - 1.00 mg/dL 0.97   Sodium 135 - 145 mmol/L 143   Potassium 3.5 - 5.1 mmol/L 4.2   Chloride 98 - 111 mmol/L 105   CO2 22 - 32 mmol/L 31    Calcium 8.9 - 10.3 mg/dL 8.1       Latest Ref Rng & Units 11/11/2022    6:40 PM  CBC  Hemoglobin 12.0 - 15.0 g/dL 8.8   Hematocrit 36.0 - 46.0 % 28.2     '@IMAGES'$ @  US THORACENTESIS ASP PLEURAL SPACE W/IMG GUIDE  Result Date: 11/11/2022 INDICATION: Pleural effusion EXAM: ULTRASOUND GUIDED RIGHT THORACENTESIS MEDICATIONS: 8 cc 1% lidocaine COMPLICATIONS: None immediate. PROCEDURE: An ultrasound guided thoracentesis was thoroughly discussed with the patient and questions answered. The benefits, risks, alternatives and complications were also discussed. The patient understands and wishes to proceed with the procedure. Written consent was obtained. Ultrasound was performed to localize and mark an adequate pocket of fluid in the right chest. The area was then prepped and draped in the normal sterile fashion. 1% Lidocaine was used for local anesthesia. Under ultrasound guidance a 6 Fr Safe-T-Centesis catheter was introduced. Thoracentesis was performed. The catheter was removed and a dressing applied. FINDINGS: A total of approximately 1.2 L of hazy amber fluid was removed. Samples were sent to the laboratory as requested by the clinical team. IMPRESSION: Successful ultrasound guided right thoracentesis yielding 1.2 L of pleural fluid. Follow-up chest x-ray revealed no evidence of pneumothorax. Read by: Reatha Armour, PA-C Electronically Signed   By: Jerilynn Mages.  Shick M.D.   On: 11/11/2022 07:54   DG Chest Port 1 View  Result Date: 11/10/2022 CLINICAL DATA:  Status post right thoracentesis, pleural effusion. EXAM: PORTABLE CHEST 1 VIEW COMPARISON:  Chest  CT 11/07/2022 and chest radiograph from the same date FINDINGS: Power injectable right Port-A-Cath tip: SVC. Moderate bilateral pleural effusions are observed. No visible pneumothorax. Some improvement in aeration in the right mid lung likely reflects reduced pleural fluid related to the thoracentesis. Upper normal heart size. Indistinct pulmonary vasculature,  cannot exclude pulmonary venous hypertension and mild edema. Rounded density in the right mid lung compatible with pulmonary nodule (previously shown to be partially calcified). IMPRESSION: 1. No pneumothorax status post right thoracentesis. 2. Moderate bilateral pleural effusions. 3. Indistinct pulmonary vasculature, cannot exclude pulmonary venous hypertension and mild edema. 4. Rounded density in the right mid lung compatible with a pulmonary nodule (previously shown to be partially calcified). 5. Upper normal heart size. Electronically Signed   By: Van Clines M.D.   On: 11/10/2022 16:30   CT CHEST ABDOMEN PELVIS W CONTRAST  Result Date: 11/07/2022 CLINICAL DATA:  Sepsis. Dementia. Lymphoma. Hemorrhagic shock requiring blood transfusions and pressor support. * Tracking Code: BO * EXAM: CT CHEST, ABDOMEN, AND PELVIS WITH CONTRAST TECHNIQUE: Multidetector CT imaging of the chest, abdomen and pelvis was performed following the standard protocol during bolus administration of intravenous contrast. RADIATION DOSE REDUCTION: This exam was performed according to the departmental dose-optimization program which includes automated exposure control, adjustment of the mA and/or kV according to patient size and/or use of iterative reconstruction technique. CONTRAST:  135m OMNIPAQUE IOHEXOL 300 MG/ML  SOLN COMPARISON:  Chest radiograph from earlier today. 09/19/2022 CT abdomen/pelvis. 07/12/2022 chest CT angiogram. FINDINGS: CT CHEST FINDINGS Cardiovascular: Normal heart size. No significant pericardial effusion/thickening. Left anterior descending coronary atherosclerosis. Right internal jugular Port-A-Cath terminates at the cavoatrial junction. Atherosclerotic nonaneurysmal thoracic aorta. Normal caliber pulmonary arteries. No central pulmonary emboli. Mediastinum/Nodes: No significant thyroid nodules. Unremarkable esophagus. Widespread mild to moderate bilateral axillary, bilateral lower neck, bilateral  mediastinal and bilateral hilar lymphadenopathy, overall mildly decreased. Representative 1.6 cm right axillary node (series 3/image 23), decreased from 2.0 cm. Representative 1.7 cm right paratracheal node (series 3/image 19), mildly decreased from 1.9 cm. Representative 1.6 cm AP window node (series 3/image 20), stable. Lungs/Pleura: No pneumothorax. Moderate to large right and small to moderate left dependent pleural effusions with mild smooth bilateral pleural thickening and enhancement. New patchy consolidation in the posterior right upper lobe. New prominent patchy tree-in-bud opacities throughout the bilateral upper lobes. Calcified 1.9 cm posterior right upper lobe nodule is stable. Moderate compressive atelectasis in the posterior lower lobes bilaterally. Musculoskeletal: No aggressive appearing focal osseous lesions. Mild thoracic spondylosis. Mild anasarca. CT ABDOMEN PELVIS FINDINGS Hepatobiliary: Normal liver with no liver mass. Gallstones within the contracted gallbladder with no definite gallbladder wall thickening. No biliary ductal dilatation. Pancreas: Normal, with no mass or duct dilation. Spleen: Marked splenomegaly measuring 21.5 cm craniocaudal length, previously 19.7 cm, mildly increased. No splenic masses. Adrenals/Urinary Tract: Normal adrenals. Normal kidneys with no hydronephrosis and no renal mass. Well-positioned Foley catheter within the bladder. Expected minimal gas in the nondependent bladder from instrumentation. Chronic mild diffuse bladder wall thickening. Stomach/Bowel: Normal non-distended stomach. Normal caliber small bowel loops. Appendix not discretely visualized. Mild wall thickening in the nondilated terminal ileum and cecum in the right lower quadrant. No additional sites of small or large bowel wall thickening. No significant diverticulosis. Vascular/Lymphatic: Atherosclerotic nonaneurysmal abdominal aorta. Patent portal, splenic, hepatic and renal veins. Widespread  moderate lymphadenopathy throughout the retroperitoneum and bilateral pelvis is overall slightly decreased from 09/19/2022 CT. Representative 2.3 cm portacaval node (series 3/image 63), decreased from 2.5 cm. Representative 1.7 cm  left para-aortic node (series 3/image 69), decreased from 2.0 cm. Representative 1.8 cm right inguinal node (series 3/image 111), decreased from 2.0 cm. Reproductive: Grossly normal uterus.  No adnexal mass. Other: No pneumoperitoneum. No focal fluid collection. Moderate anasarca. Musculoskeletal: No aggressive appearing focal osseous lesions. Chronic L2 and L3 vertebral compression fractures. Mild-to-moderate lumbar degenerative disc disease. IMPRESSION: 1. Moderate to large right and small to moderate left dependent pleural effusions with mild smooth bilateral pleural thickening and enhancement. 2. New patchy consolidation in the posterior right upper lobe. New prominent patchy tree-in-bud opacities throughout the bilateral upper lobes. Findings are most compatible with multilobar bronchopneumonia. 3. Widespread lymphadenopathy in the chest, abdomen and pelvis is overall mildly decreased from prior CT imaging. 4. Marked splenomegaly, mildly increased. 5. Mild wall thickening in the nondilated terminal ileum and cecum in the right lower quadrant, nonspecific, cannot exclude third-spacing, ischemia or infectious enterocolitis. No evidence of bowel obstruction. No pneumatosis. No pneumoperitoneum. 6. Moderate anasarca. 7. Cholelithiasis. 8. Chronic mild diffuse bladder wall thickening, nonspecific. 9.  Aortic Atherosclerosis (ICD10-I70.0). Electronically Signed   By: Ilona Sorrel M.D.   On: 11/07/2022 16:31   DG Chest Port 1 View  Result Date: 11/07/2022 CLINICAL DATA:  Dyspnea, vomiting, rectal bleeding, COPD, lymphoma EXAM: PORTABLE CHEST 1 VIEW COMPARISON:  10/27/2022 chest radiograph. FINDINGS: Stable right internal jugular Port-A-Cath terminating over the cavoatrial junction.  Stable cardiomediastinal silhouette with mild cardiomegaly. No pneumothorax. Similar small bilateral pleural effusions. Moderate diffuse prominence of the parahilar interstitial markings with patchy bilateral parahilar and bibasilar lung opacities, worsened. IMPRESSION: 1. Mild cardiomegaly. Moderate diffuse prominence of the parahilar interstitial markings with patchy bilateral parahilar and bibasilar lung opacities, worsened, favor congestive heart failure with pulmonary edema. A component of aspiration or pneumonia at the lung bases is not excluded. 2. Similar small bilateral pleural effusions. Electronically Signed   By: Ilona Sorrel M.D.   On: 11/07/2022 14:06   US THORACENTESIS ASP PLEURAL SPACE W/IMG GUIDE  Result Date: 10/28/2022 INDICATION: Right pleural effusion EXAM: ULTRASOUND GUIDED RIGHT THORACENTESIS MEDICATIONS: 10 cc 1% lidocaine COMPLICATIONS: None immediate. PROCEDURE: An ultrasound guided thoracentesis was thoroughly discussed with the patient and questions answered. The benefits, risks, alternatives and complications were also discussed. The patient understands and wishes to proceed with the procedure. Written consent was obtained. Ultrasound was performed to localize and mark an adequate pocket of fluid in the right chest. The area was then prepped and draped in the normal sterile fashion. 1% Lidocaine was used for local anesthesia. Under ultrasound guidance a 6 Fr Safe-T-Centesis catheter was introduced. Thoracentesis was performed. The catheter was removed and a dressing applied. FINDINGS: A total of approximately 1.0 L of amber fluid was removed. Ordering provider did not request laboratory samples IMPRESSION: Successful ultrasound guided right thoracentesis yielding 1.0 L of pleural fluid. Follow-up chest x-ray revealed no evidence of pneumothorax. Read by: Reatha Armour, PA-C Electronically Signed   By: Albin Felling M.D.   On: 10/28/2022 08:05   US Venous Img Lower Bilateral  (DVT)  Result Date: 10/27/2022 CLINICAL DATA:  Bilateral leg pain. EXAM: BILATERAL LOWER EXTREMITY VENOUS DOPPLER ULTRASOUND TECHNIQUE: Gray-scale sonography with graded compression, as well as color Doppler and duplex ultrasound were performed to evaluate the lower extremity deep venous systems from the level of the common femoral vein and including the common femoral, femoral, profunda femoral, popliteal and calf veins including the posterior tibial, peroneal and gastrocnemius veins when visible. The superficial great saphenous vein was also interrogated. Spectral Doppler was  utilized to evaluate flow at rest and with distal augmentation maneuvers in the common femoral, femoral and popliteal veins. COMPARISON:  CT AP, 09/19/2022. FINDINGS: RIGHT LOWER EXTREMITY VENOUS Normal compressibility of the RIGHT common femoral, superficial femoral, and popliteal veins, as well as the visualized calf veins. Visualized portions of profunda femoral vein and great saphenous vein unremarkable. No filling defects to suggest DVT on grayscale or color Doppler imaging. Doppler waveforms show normal direction of venous flow, normal respiratory plasticity and response to augmentation. OTHER No evidence of superficial thrombophlebitis or abnormal fluid collection. Pathologically-enlarged RIGHT inguinal lymph nodes are imaged. Limitations: none LEFT LOWER EXTREMITY VENOUS Normal compressibility of the LEFT common femoral, superficial femoral, and popliteal veins, as well as the visualized calf veins. Visualized portions of profunda femoral vein and great saphenous vein unremarkable. No filling defects to suggest DVT on grayscale or color Doppler imaging. Doppler waveforms show normal direction of venous flow, normal respiratory plasticity and response to augmentation. OTHER No evidence of superficial thrombophlebitis or abnormal fluid collection. Pathologically-enlarged LEFT inguinal lymph nodes are imaged. Limitations: none  IMPRESSION: 1. No evidence of femoropopliteal DVT or superficial thrombophlebitis within either lower extremity. 2. Inguinal lymphadenopathy, best appreciated on most recent comparison CT and consistent with history of lymphoma. Michaelle Birks, MD [facility] Electronically Signed   By: Michaelle Birks M.D.   On: 10/27/2022 17:29   DG Chest Port 1 View  Result Date: 10/27/2022 CLINICAL DATA:  Post thoracentesis EXAM: PORTABLE CHEST 1 VIEW COMPARISON:  10/25/2022, CT 07/12/2022, chest x-ray 10/20/2022 FINDINGS: Right-sided central venous port tip over the SVC. Cardiomegaly with vascular congestion and probable edema. Moderate bilateral pleural effusions, increased on the left, decreased on the right. No right pneumothorax. Basilar airspace disease. IMPRESSION: 1. Moderate bilateral pleural effusions, increased on the left and decreased on the right. No pneumothorax. 2. Cardiomegaly with vascular congestion and probable edema. Basilar airspace disease persists. Electronically Signed   By: Donavan Foil M.D.   On: 10/27/2022 16:28   [place] CHEST (PLEURAL EFFUSION)  Result Date: 10/26/2022 CLINICAL DATA:  77 year old female with pleural effusion presenting for thoracentesis. EXAM: CHEST ULTRASOUND COMPARISON:  10/25/2022 FINDINGS: Large, simple appearing right pleural effusion. IMPRESSION: Large right pleural effusion. No thoracentesis was performed at this time due to significant hypotension. The patient may return for thoracentesis once resolved. Ruthann Cancer, MD [facility] Radiology Electronically Signed   By: Ruthann Cancer M.D.   On: 10/26/2022 10:13   DG Chest Port 1 View  Result Date: 10/25/2022 CLINICAL DATA:  Pleural effusion.  Productive cough. EXAM: PORTABLE CHEST 1 VIEW COMPARISON:  October 20, 2022 FINDINGS: Cardiomediastinal silhouette is stable. Stable right Port-A-Cath. No pneumothorax. Small left  effusion with underlying atelectasis remains. A right pleural effusion with underlying opacity has increased. Diffuse bilateral pulmonary opacities are identified, worsened in the interval. No other interval changes. IMPRESSION: 1. Worsening right effusion with underlying opacity. Stable left effusion. 2. Diffuse bilateral pulmonary opacities may represent worsening pulmonary edema or a diffuse infectious process. Recommend clinical correlation. Electronically Signed   By: Dorise Bullion III M.D.   On: 10/25/2022 11:07   DG Chest Port 1 View  Result Date: 10/20/2022 CLINICAL DATA:  77 year old female with possible sepsis. EXAM: PORTABLE CHEST 1 VIEW COMPARISON:  Portable chest 09/20/2022 and earlier. FINDINGS: Portable AP semi upright view at 1019 hours. Ongoing moderate bilateral pleural effusions, not significantly changed. Right chest power port redemonstrated. Stable visible mediastinal contours. Partially  calcified right lung nodule appears stable from a a chest CT 04/24/2022 (please see that report). Pulmonary vascularity appears stable since November. No pneumothorax. Paucity of bowel gas in the visible abdomen. Stable visualized osseous structures. IMPRESSION: 1. Ongoing moderate bilateral pleural effusions and pulmonary vascular congestion or mild edema, not significantly changed since November. 2. No new cardiopulmonary abnormality. Electronically Signed   By: Genevie Ann M.D.   On: 10/20/2022 10:31    Assessment and plan- Patient is a 77 y.o. female history of stage IV nodal marginal zone lymphoma admitted for acute upper GI bleed as well as influenza pneumonia  Patient has had recurrent hospitalizations in the last 3 to 4 months secondary to [place], influenza, recurrent pleural effusion and now from upper GI bleed.  She had started acalabrutinib as it was the most tolerable regimen that I could put her on instead of doing IV chemotherapy such as Rituxan and Bendamustine which has higher risk of  infectious complications.  Despite being on acalabrutinib for 3 months patient has not had much of treatment response.  A repeat CT chest abdomen pelvis with contrast done during this hospitalization shows minimal decrease in the size of extensive adenopathy.  She continues to have significant splenomegaly as well as pleural effusions.  During my conversation with the patient today she desires more treatment as an outpatient should she feel better.  Although her stage IV low-grade lymphoma by itself would not qualify her for hospice, given her recurrent hospitalizations and other comorbidities of patient and her daughter would not like to pursue treatment that would be reasonable to proceed with best supportive care/hospice.  I appreciate palliative care input as well.  I will continue to follow-up with her as an outpatient and decide which route she would like to take.  If she would like to pursue treatment I would like to stop her acalabrutinib at this time since that drug can be associated with bleeding complications and patient presented with upper GI bleed.  I will consider treating her with split dose Rituxan along with Revlimid if she can tolerate that.  Her baseline neutropenia and thrombocytopenia as well as anemia is secondary to her underlying lymphoma.  Her baseline hemoglobin has been between 7-8 and she has required intermittent transfusions in the past.  However during this hospitalization her hemoglobin did drop acutely to 4.6 secondary to her upper GI bleed which is presently being managed conservatively.  It is unlikely that her hemoglobin will improve any further than 7-8 unless there is a significant response to her underlying lymphoma management.  Also her baseline pleural effusions which are recurrent or secondary to her lymphoma as well.    Visit Diagnosis Nodal marginal zone lymphoma Goals of care counseling/discussion  Dr. Randa Evens, MD, MPH [facility] [phone] 11/11/2022

## 2022-11-11 NOTE — Hospital Course (Addendum)
77 year old female with a history of marginal cell lymphoma status post having right-sided subclavian Port-A-Cath, chronic lower extremity edema, COPD with nicotine dependence, type 2 diabetes, history of pancytopenia, recurrent pleural effusions status postthoracentesis and pneumothorax.  Patient admitted with hemorrhagic shock requiring blood transfusion with requirement for Levophed.  Pulmonary critical care asked for admission due to shock physiology and acute blood loss anemia.   11/08/22- patient was unable to have GI procedure due to medical instability. She has poor prognosis overall, she relates she does not want to have intubation or CPR.  I will call palliative consultation.  There is plan for thoracentesis.  11/09/22- patient stopped bleeding and is more stable off vasopressors.  11/10/22- patient refusing all care.  Will order palliative consultation and recommendation for hospice.  Patient had thoracentesis 1.2 L drawn off underneath the right lung.. She is declining endoluminal evaluation by GI 1/18.  Medical service taking over care.  Hemoglobin on the lower side.  Patient tolerated diet this morning.  Patient asked about endoscopy today.  Reevaluated by gastroenterology and felt that the patient did not need any further procedure at this time. 1/19.  Patient wanted to go home.  Seen by physical therapy recommended home health.  Patient was able to stand up for me without assistance and walk in place without a problem.  Home health set up.

## 2022-11-11 NOTE — Progress Notes (Signed)
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  Patient coming in now with hemorrhagic shock requiring blood transfusion with requirement for Levophed.  Pulmonary critical care asked for admission due to shock physiology and acute blood loss anemia.  11/08/22- patient was unable to have GI procedure due to medical instability. She has poor prognosis overall, she relates she does not want to have intubation or CPR.  I will call palliative consultation.  There is plan for thoracentesis.  11/09/22- patient stopped bleeding and is more stable off vasopressors.  11/10/22- patient refusing all care.  Will order palliative consultation and recommendation for hospice.  She does not want thoracentesis for large pleural effusion. She is declining endoluminal evaluation by GI.  11/11/22- patient optimizing for [facility] transfer. She is s/p thoracentesis 1.2L removed from right pleural space.  She feels better. Gi continues to follow.  Transfering to [facility] is available when needed.   Lines/tubes : Urethral Catheter T. [place] Non-latex;Temperature probe 14 Fr. (Active)    Microbiology/Sepsis markers: Results for orders placed or performed during the hospital encounter of 11/07/22  Resp panel by RT-PCR (RSV, Flu A&B, Covid)     Status: Abnormal   Collection Time: 11/07/22 12:53 PM   Specimen: Nasal Swab   Result Value Ref Range Status   SARS Coronavirus 2 by RT PCR NEGATIVE NEGATIVE Final    Comment: (NOTE) SARS-CoV-2 target nucleic acids are NOT DETECTED.  The SARS-CoV-2 RNA is generally detectable in upper respiratory specimens during the acute phase of infection. The lowest concentration of SARS-CoV-2 viral copies this assay can detect is 138 copies/mL. A negative result does not preclude SARS-Cov-2 infection and should not be used as the sole basis for treatment or other patient management decisions. A negative result may occur with  improper specimen collection/handling, submission of specimen other than nasopharyngeal swab, presence of viral mutation(s) within the areas targeted by this assay, and inadequate number of viral copies(<138 copies/mL). A negative result must be combined with clinical observations, patient history, and epidemiological information. The expected result is Negative.  Fact Sheet for Patients:  EntrepreneurPulse.com.au  Fact Sheet for Healthcare Providers:  IncredibleEmployment.be  This test is no t yet approved or cleared by the [place] FDA and  has been authorized for detection and/or diagnosis of SARS-CoV-2 by FDA under an Emergency Use Authorization (EUA). This EUA will remain  in effect (meaning this test can be used) for the duration of the COVID-19 declaration under Section 564(b)(1) of the Act, 21 U.S.C.section 360bbb-3(b)(1), unless the authorization is terminated  or revoked sooner.       Influenza A by PCR POSITIVE (A) NEGATIVE Final   Influenza B by PCR NEGATIVE NEGATIVE Final    Comment: (NOTE) The Xpert Xpress SARS-CoV-2/FLU/RSV plus assay is intended as an aid in the diagnosis of influenza from Nasopharyngeal swab specimens and should not be used as a sole basis for treatment. Nasal washings and aspirates are unacceptable for Xpert Xpress SARS-CoV-2/FLU/RSV testing.  Fact Sheet for  Patients: EntrepreneurPulse.com.au  Fact Sheet for Healthcare Providers: IncredibleEmployment.be  This test is not yet approved or cleared  by the [place] and has been authorized for detection and/or diagnosis of SARS-CoV-2 by FDA under an Emergency Use Authorization (EUA). This EUA will remain in effect (meaning this test can be used) for the duration of the COVID-19 declaration under Section 564(b)(1) of the Act, 21 U.S.C. section 360bbb-3(b)(1), unless the authorization is terminated or revoked.     Resp Syncytial Virus by PCR NEGATIVE NEGATIVE Final    Comment: (NOTE) Fact Sheet for Patients: EntrepreneurPulse.com.au  Fact Sheet for Healthcare Providers: IncredibleEmployment.be  This test is not yet approved or cleared by the [place] FDA and has been authorized for detection and/or diagnosis of SARS-CoV-2 by FDA under an Emergency Use Authorization (EUA). This EUA will remain in effect (meaning this test can be used) for the duration of the COVID-19 declaration under Section 564(b)(1) of the Act, 21 U.S.C. section 360bbb-3(b)(1), unless the authorization is terminated or revoked.  Performed at [facility], [address] [place], [place] [address]   Respiratory (~20 pathogens) panel by PCR     Status: Abnormal   Collection Time: 11/07/22 12:53 PM   Specimen: Nasopharyngeal Swab; Respiratory  Result Value Ref Range Status   Adenovirus NOT DETECTED NOT DETECTED Final   Coronavirus 229E NOT DETECTED NOT DETECTED Final    Comment: (NOTE) The Coronavirus on the Respiratory Panel, DOES NOT test for the novel  Coronavirus (2019 nCoV)    Coronavirus HKU1 NOT DETECTED NOT DETECTED Final   Coronavirus NL63 NOT DETECTED NOT DETECTED Final   Coronavirus OC43 NOT DETECTED NOT DETECTED Final   Metapneumovirus NOT DETECTED NOT DETECTED Final   Rhinovirus / Enterovirus NOT DETECTED NOT  DETECTED Final   Influenza A H1 2009 DETECTED (A) NOT DETECTED Final   Influenza B NOT DETECTED NOT DETECTED Final   Parainfluenza Virus 1 NOT DETECTED NOT DETECTED Final   Parainfluenza Virus 2 NOT DETECTED NOT DETECTED Final   Parainfluenza Virus 3 NOT DETECTED NOT DETECTED Final   Parainfluenza Virus 4 NOT DETECTED NOT DETECTED Final   Respiratory Syncytial Virus NOT DETECTED NOT DETECTED Final   Bordetella pertussis NOT DETECTED NOT DETECTED Final   Bordetella Parapertussis NOT DETECTED NOT DETECTED Final   Chlamydophila pneumoniae NOT DETECTED NOT DETECTED Final   Mycoplasma pneumoniae NOT DETECTED NOT DETECTED Final    Comment: Performed at [facility], [place]. [address], [place], [place] [address]  MRSA Next Gen by PCR, Nasal     Status: None   Collection Time: 11/07/22  5:17 PM   Specimen: Nasal Mucosa; Nasal Swab  Result Value Ref Range Status   MRSA by PCR Next Gen NOT DETECTED NOT DETECTED Final    Comment: (NOTE) The GeneXpert MRSA Assay (FDA approved for NASAL specimens only), is one component of a comprehensive MRSA colonization surveillance program. It is not intended to diagnose MRSA infection nor to guide or monitor treatment for MRSA infections. Test performance is not FDA approved in patients less than 52 years old. Performed at [facility], [address] [place], [place] [address]   Gastrointestinal Panel by PCR , Stool     Status: None   Collection Time: 11/09/22  5:30 PM   Specimen: Stool  Result Value Ref Range Status   Campylobacter species NOT DETECTED NOT DETECTED Final   Plesimonas shigelloides NOT DETECTED NOT DETECTED Final   Salmonella species NOT DETECTED NOT DETECTED Final   Yersinia enterocolitica NOT DETECTED NOT DETECTED Final   Vibrio species NOT DETECTED NOT DETECTED Final  Vibrio cholerae NOT DETECTED NOT DETECTED Final   Enteroaggregative E coli (EAEC) NOT DETECTED NOT DETECTED Final   Enteropathogenic E coli (EPEC) NOT  DETECTED NOT DETECTED Final   Enterotoxigenic E coli (ETEC) NOT DETECTED NOT DETECTED Final   Shiga like toxin producing E coli (STEC) NOT DETECTED NOT DETECTED Final   Shigella/Enteroinvasive E coli (EIEC) NOT DETECTED NOT DETECTED Final   Cryptosporidium NOT DETECTED NOT DETECTED Final   Cyclospora cayetanensis NOT DETECTED NOT DETECTED Final   Entamoeba histolytica NOT DETECTED NOT DETECTED Final   Giardia lamblia NOT DETECTED NOT DETECTED Final   Adenovirus F40/41 NOT DETECTED NOT DETECTED Final   Astrovirus NOT DETECTED NOT DETECTED Final   Norovirus GI/GII NOT DETECTED NOT DETECTED Final   Rotavirus A NOT DETECTED NOT DETECTED Final   Sapovirus (I, II, IV, and V) NOT DETECTED NOT DETECTED Final    Comment: Performed at [facility], [address] [place], [place] [address]  C Difficile Quick Screen w PCR reflex     Status: None   Collection Time: 11/09/22  5:30 PM   Specimen: STOOL  Result Value Ref Range Status   C Diff antigen NEGATIVE NEGATIVE Final   C Diff toxin NEGATIVE NEGATIVE Final   C Diff interpretation No C. difficile detected.  Final    Comment: Performed at [facility], [address], [place], [place] [address]    Anti-infectives:  Anti-infectives (From admission, onward)    Start     Dose/Rate Route Frequency Ordered Stop   11/10/22 2200  amoxicillin-clavulanate (AUGMENTIN) 875-125 MG per tablet 1 tablet        1 tablet Oral Every 12 hours 11/10/22 1029 11/13/22 0959   11/09/22 1000  acyclovir (ZOVIRAX) 200 MG capsule 400 mg        400 mg Oral Daily 11/08/22 1108     11/07/22 2200  oseltamivir (TAMIFLU) capsule 30 mg        30 mg Oral 2 times daily 11/07/22 1531 11/12/22 0959   11/07/22 1545  oseltamivir (TAMIFLU) capsule 75 mg        75 mg Oral  Once 11/07/22 1542     11/07/22 1530  cefTRIAXone (ROCEPHIN) 1 g in sodium chloride 0.9 % 100 mL IVPB  Status:  Discontinued        1 g 200 mL/hr over 30 Minutes Intravenous Every 24 hours  11/07/22 1529 11/10/22 1029   11/07/22 1530  azithromycin (ZITHROMAX) 500 mg in sodium chloride 0.9 % 250 mL IVPB  Status:  Discontinued        500 mg 250 mL/hr over 60 Minutes Intravenous Every 24 hours 11/07/22 1529 11/10/22 1029        Tests / Events: SURGICAL PATHOLOGY Surgical Pathology CASE: [id number] PATIENT: Breella Hurlock Flow Pathology Report     Clinical history: marginal zone lymphoma     DIAGNOSIS:  -Monoclonal B-cell population identified -See comment  COMMENT:  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   CHF (congestive heart failure) ([place])    COPD (chronic obstructive pulmonary disease) ([facility])    Diabetes mellitus without complication ([facility])    Hypotension    Pleural effusion    ARMc 854m,  2 weeks  ago   Vaginal delivery    x 5     SURGICAL HISTORY   Past Surgical History:  Procedure Laterality Date   IR IMAGING GUIDED PORT INSERTION  08/16/2022   TONSILLECTOMY Bilateral    as a child     FAMILY HISTORY   Family History  Problem Relation Age of Onset   Breast cancer Mother    Alzheimer's disease Father    Lung cancer Father        lung   Varicose Veins Brother    Heart attack Brother      SOCIAL HISTORY   Social History   Tobacco Use   Smoking status: Former    Packs/day: 2.00    Years: 55.00    Total pack years: 110.00    Types: Cigarettes    Start date: 07/07/1961    Quit date: 08/02/2022    Years since quitting: 0.2   Smokeless tobacco: Never   Tobacco comments:    1/2 pack she states but family says 2 PPD  Vaping Use   Vaping Use: Never used  Substance Use Topics   Alcohol use: No   Drug use: Never     MEDICATIONS   Current Medication:  Current Facility-Administered  Medications:    0.9 %  sodium chloride infusion (Manually program via Guardrails IV Fluids), , Intravenous, Once, Rust-Chester, Britton L, NP   0.9 %  sodium chloride infusion, , Intravenous, PRN, Ottie Glazier, MD, Stopped at 11/09/22 1747   0.9 %  sodium chloride infusion, 250 mL, Intravenous, Continuous, Vallery Sa D, RPH   acyclovir (ZOVIRAX) 200 MG capsule 400 mg, 400 mg, Oral, Daily, Dallie Piles, RPH, 400 mg at 11/11/22 0950   amoxicillin-clavulanate (AUGMENTIN) 875-125 MG per tablet 1 tablet, 1 tablet, Oral, Q12H, Ottie Glazier, MD, 1 tablet at 11/11/22 0950   Chlorhexidine Gluconate Cloth 2 % PADS 6 each, 6 each, Topical, Daily, Duval Macleod, MD, 6 each at 11/11/22 1342   docusate sodium (COLACE) capsule 100 mg, 100 mg, Oral, BID PRN, Lanney Gins, Gage Weant, MD   guaiFENesin (ROBITUSSIN) 100 MG/5ML liquid 5 mL, 5 mL, Oral, Q4H PRN, Teressa Lower, NP, 5 mL at 11/09/22 2219   insulin aspart (novoLOG) injection 0-15 Units, 0-15 Units, Subcutaneous, Q4H, Teressa Lower, NP, 3 Units at 11/11/22 1341   levothyroxine (SYNTHROID) tablet 50 mcg, 50 mcg, Oral, Q0600, Dallie Piles, RPH, 50 mcg at 11/11/22 1610   midodrine (PROAMATINE) tablet 10 mg, 10 mg, Oral, TID WC, Wieting, Richard, MD, 10 mg at 11/11/22 1334   morphine (PF) 2 MG/ML injection 1 mg, 1 mg, Intravenous, Q3H PRN, Ottie Glazier, MD, 1 mg at 11/09/22 2219   Oral care mouth rinse, 15 mL, Mouth Rinse, PRN, Ottie Glazier, MD   oseltamivir (TAMIFLU) capsule 30 mg, 30 mg, Oral, BID, Lanney Gins, Khylie Larmore, MD, 30 mg at 11/11/22 0950   oseltamivir (TAMIFLU) capsule 75 mg, 75 mg, Oral, Once, Wai Litt, MD   pantoprazole (PROTONIX) injection 40 mg, 40 mg, Intravenous, Q12H, Gar Glance, MD, 40 mg at 11/11/22 0949   polyethylene glycol (MIRALAX / GLYCOLAX) packet 17 g, 17 g, Oral, Daily PRN, Lanney Gins, Joycelynn Fritsche, MD   sodium chloride flush (NS) 0.9 % injection 10-40 mL, 10-40 mL, Intracatheter, PRN, Lanney Gins, Muhammadali Ries, MD, 10 mL at  11/07/22 1954   thiamine (VITAMIN B1) injection 100 mg, 100 mg, Intravenous, Daily, Lanney Gins, Anika Shore, MD, 100 mg at 11/11/22 9604    ALLERGIES   Patient has no known allergies.  REVIEW OF SYSTEMS     10 point ROS conducted is negative except for back pain and weakness  PHYSICAL EXAMINATION   Vital Signs: Temp:  [98.1 F (36.7 C)-99.7 F (37.6 C)] 98.1 F (36.7 C) (01/18 1500) Pulse Rate:  [75-96] 78 (01/18 1500) Resp:  [12-25] 16 (01/18 1500) BP: (92-119)/(48-91) 98/60 (01/18 1500) SpO2:  [95 %-100 %] 100 % (01/18 1500) Weight:  [72.9 kg] 72.9 kg (01/18 0447)  GENERAL: Age-appropriate mild distress due to acutely ill status HEAD: Normocephalic, atraumatic.  EYES: Pupils equal, round, reactive to light.  No scleral icterus.  MOUTH: Moist mucosal membrane. NECK: Supple. No thyromegaly. No nodules. No JVD.  PULMONARY: Decreased breath sounds bilaterally CARDIOVASCULAR: S1 and S2. Regular rate and rhythm. No murmurs, rubs, or gallops.  GASTROINTESTINAL: Soft, nontender, non-distended. No masses. Positive bowel sounds. No hepatosplenomegaly.  MUSCULOSKELETAL: No swelling, clubbing, or edema.  NEUROLOGIC: Mild distress due to acute illness SKIN:intact,warm,dry   PERTINENT DATA     Infusions:  sodium chloride Stopped (11/09/22 1747)   sodium chloride     Scheduled Medications:  sodium chloride   Intravenous Once   acyclovir  400 mg Oral Daily   amoxicillin-clavulanate  1 tablet Oral Q12H   Chlorhexidine Gluconate Cloth  6 each Topical Daily   insulin aspart  0-15 Units Subcutaneous Q4H   levothyroxine  50 mcg Oral Q0600   midodrine  10 mg Oral TID WC   oseltamivir  30 mg Oral BID   oseltamivir  75 mg Oral Once   pantoprazole (PROTONIX) IV  40 mg Intravenous Q12H   thiamine (VITAMIN B1) injection  100 mg Intravenous Daily   PRN Medications: sodium chloride, docusate sodium, guaiFENesin, morphine injection, mouth rinse, polyethylene glycol, sodium chloride  flush Hemodynamic parameters:   Intake/Output: 01/17 0701 - 01/18 0700 In: 1465.3 [I.V.:845.3; Blood:620] Out: 2050 [Urine:2050]  Ventilator  Settings:    LAB RESULTS:  Basic Metabolic Panel: Recent Labs  Lab 11/07/22 2302 11/08/22 0425 11/09/22 0420 11/10/22 0522 11/11/22 0500  NA 139 140 141 140 143  K 5.1 4.6 4.1 4.0 4.2  CL 104 104 107 106 105  CO2 '27 29 29 30 31  '$ GLUCOSE 137* 126* 109* 115* 93  BUN 59* 57* 47* 36* 29*  CREATININE 1.00 1.07* 1.10* 0.98 0.97  CALCIUM 8.0* 8.0* 8.3* 8.3* 8.1*  MG  --  1.5* 1.9 1.8 1.7  PHOS  --  4.8* 4.9* 4.4 3.7    Liver Function Tests: Recent Labs  Lab 11/05/22 1113 11/07/22 1253  AST 17 20  ALT 10 11  ALKPHOS 159* 123  BILITOT 1.4* 1.2  PROT 4.7* 3.7*  ALBUMIN 2.8* 2.1*    No results for input(s): "LIPASE", "AMYLASE" in the last 168 hours. No results for input(s): "AMMONIA" in the last 168 hours. CBC: Recent Labs  Lab 11/05/22 1113 11/07/22 1253 11/07/22 2302 11/08/22 0425 11/08/22 0847 11/09/22 0420 11/09/22 1452 11/09/22 2239 11/10/22 0522 11/10/22 1448 11/10/22 2225 11/11/22 0500 11/11/22 1021 11/11/22 1145  WBC 4.7   < > 9.4 8.2  --  5.8  --   --  4.1  --   --  3.8*  --   --   NEUTROABS 1.6*  --   --  2.1  --  1.6*  --   --  1.1*  --   --   --   --   --   HGB 7.7*   < > 7.1* 6.8*  6.7*   < > 7.2*   < >  7.3* 7.2* 7.1* 6.5* 7.9* 8.4* 7.6*  HCT 26.2*   < > 22.4* 21.1*  20.7*   < > 22.7*   < > 22.7* 23.5* 22.6* 21.4* 25.5* 27.5* 24.9*  MCV 92.9   < > 88.5 88.7  --  89.7  --   --  94.0  --   --  90.1  --   --   PLT 55*   < > 55* 64*  --  58*  --  49* 59*  --   --  71*  --   --    < > = values in this interval not displayed.    Cardiac Enzymes: No results for input(s): "CKTOTAL", "CKMB", "CKMBINDEX", "TROPONINI" in the last 168 hours. BNP: Invalid input(s): "POCBNP" CBG: Recent Labs  Lab 11/10/22 2000 11/10/22 2308 11/11/22 0353 11/11/22 0755 11/11/22 1206  GLUCAP 196* 204* 113* 106* 169*         IMAGING RESULTS:  Imaging: US THORACENTESIS ASP PLEURAL SPACE W/IMG GUIDE  Result Date: 11/11/2022 INDICATION: Pleural effusion EXAM: ULTRASOUND GUIDED RIGHT THORACENTESIS MEDICATIONS: 8 cc 1% lidocaine COMPLICATIONS: None immediate. PROCEDURE: An ultrasound guided thoracentesis was thoroughly discussed with the patient and questions answered. The benefits, risks, alternatives and complications were also discussed. The patient understands and wishes to proceed with the procedure. Written consent was obtained. Ultrasound was performed to localize and mark an adequate pocket of fluid in the right chest. The area was then prepped and draped in the normal sterile fashion. 1% Lidocaine was used for local anesthesia. Under ultrasound guidance a 6 Fr Safe-T-Centesis catheter was introduced. Thoracentesis was performed. The catheter was removed and a dressing applied. FINDINGS: A total of approximately 1.2 L of hazy amber fluid was removed. Samples were sent to the laboratory as requested by the clinical team. IMPRESSION: Successful ultrasound guided right thoracentesis yielding 1.2 L of pleural fluid. Follow-up chest x-ray revealed no evidence of pneumothorax. Read by: Reatha Armour, PA-C Electronically Signed   By: Jerilynn Mages.  Shick M.D.   On: 11/11/2022 07:54   DG Chest Port 1 View  Result Date: 11/10/2022 CLINICAL DATA:  Status post right thoracentesis, pleural effusion. EXAM: PORTABLE CHEST 1 VIEW COMPARISON:  Chest CT 11/07/2022 and chest radiograph from the same date FINDINGS: Power injectable right Port-A-Cath tip: SVC. Moderate bilateral pleural effusions are observed. No visible pneumothorax. Some improvement in aeration in the right mid lung likely reflects reduced pleural fluid related to the thoracentesis. Upper normal heart size. Indistinct pulmonary vasculature, cannot exclude pulmonary venous hypertension and mild edema. Rounded density in the right mid lung compatible with pulmonary nodule  (previously shown to be partially calcified). IMPRESSION: 1. No pneumothorax status post right thoracentesis. 2. Moderate bilateral pleural effusions. 3. Indistinct pulmonary vasculature, cannot exclude pulmonary venous hypertension and mild edema. 4. Rounded density in the right mid lung compatible with a pulmonary nodule (previously shown to be partially calcified). 5. Upper normal heart size. Electronically Signed   By: Van Clines M.D.   On: 11/10/2022 16:30   '@PROBHOSP'$ @ DG Chest Port 1 View  Result Date: 11/10/2022 CLINICAL DATA:  Status post right thoracentesis, pleural effusion. EXAM: PORTABLE CHEST 1 VIEW COMPARISON:  Chest CT 11/07/2022 and chest radiograph from the same date FINDINGS: Power injectable right Port-A-Cath tip: SVC. Moderate bilateral pleural effusions are observed. No visible pneumothorax. Some improvement in aeration in the right mid lung likely reflects reduced pleural fluid related to the thoracentesis. Upper normal heart size. Indistinct pulmonary vasculature, cannot exclude pulmonary venous hypertension  and mild edema. Rounded density in the right mid lung compatible with pulmonary nodule (previously shown to be partially calcified). IMPRESSION: 1. No pneumothorax status post right thoracentesis. 2. Moderate bilateral pleural effusions. 3. Indistinct pulmonary vasculature, cannot exclude pulmonary venous hypertension and mild edema. 4. Rounded density in the right mid lung compatible with a pulmonary nodule (previously shown to be partially calcified). 5. Upper normal heart size. Electronically Signed   By: Van Clines M.D.   On: 11/10/2022 16:30       Resp panel by RT-PCR (RSV, Flu A&B, Covid) Order: [id number] Status: Final result     Visible to patient: Yes (seen)     Next appt: 11/12/2022 at 02:40 PM in Family Medicine [name], NP)   Specimen Information: Nasal Swab  0 Result Notes      Component Ref Range & Units 12:53 2 wk ago  SARS  Coronavirus 2 by RT PCR NEGATIVE NEGATIVE NEGATIVE CM  Comment: (NOTE) SARS-CoV-2 target nucleic acids are NOT DETECTED.  The SARS-CoV-2 RNA is generally detectable in upper respiratory specimens during the acute phase of infection. The lowest concentration of SARS-CoV-2 viral copies this assay can detect is 138 copies/mL. A negative result does not preclude SARS-Cov-2 infection and should not be used as the sole basis for treatment or other patient management decisions. A negative result may occur with improper specimen collection/handling, submission of specimen other than nasopharyngeal swab, presence of viral mutation(s) within the areas targeted by this assay, and inadequate number of viral copies(<138 copies/mL). A negative result must be combined with clinical observations, patient history, and epidemiological information. The expected result is Negative.  Fact Sheet for Patients: EntrepreneurPulse.com.au  Fact Sheet for Healthcare Providers: IncredibleEmployment.be  This test is not yet approved or cleared by the [place] FDA and has been authorized for detection and/or diagnosis of SARS-CoV-2 by FDA under an Emergency Use Authorization (EUA). This EUA will remain in effect (meaning this test can be used) for the duration of the COVID-19 declaration under Section 564(b)(1) of the Act, 21 U.S.C.section 360bbb-3(b)(1), unless the authorization is terminated or revoked sooner.    Influenza A by PCR NEGATIVE POSITIVE Abnormal  POSITIVE Abnormal   Influenza B by PCR NEGATIVE NEGATIVE        ASSESSMENT AND PLAN    -Multidisciplinary rounds held today  Hemorrhagic shock    -s/p PRBC transfusion and IVF   -shock physiology has improved   Acute blood loss anemia -s/p PRBC transfusion  -goal hB >8 -s/p CT abd - possible infectioous enteritis on scan, marked abdominal lymphadenopathy  Chronic centrilobular emphysema -oxygen as  needed -albuterol PRN   Multifocal bilateral pneumonia  - hx of recurrent effusions and thoracentesis  - IR US guided thoracentesis post CT chest   - empiric Rocephin and Zithromax  -influenza A + tamiflu   Chronic diastolic CHF   Patient is edematous   Chronic recurrent pleural effusions   - patient may need repeat thoracentesis   - CT chest pending    Chronic dementia  - 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  ID -continue IV abx as prescibed -follow up cultures  GI/Nutrition GI PROPHYLAXIS as indicated DIET-->TF's as tolerated Constipation protocol as indicated  ENDO - ICU hypoglycemic\Hyperglycemia protocol -check FSBS per protocol   ELECTROLYTES -follow labs as needed -replace as needed -pharmacy consultation   DVT/GI PRX ordered -SCDs  TRANSFUSIONS AS NEEDED MONITOR FSBS ASSESS the need for LABS as needed  Critical care provider statement:  Total critical care time: 33 minutes   Performed by: Lanney Gins MD   Critical care time was exclusive of separately billable procedures and treating other patients.   Critical care was necessary to treat or prevent imminent or life-threatening deterioration.   Critical care was time spent personally by me on the following activities: development of treatment plan with patient and/or surrogate as well as nursing, discussions with consultants, evaluation of patient's response to treatment, examination of patient, obtaining history from patient or surrogate, ordering and performing treatments and interventions, ordering and review of laboratory studies, ordering and review of radiographic studies, pulse oximetry and re-evaluation of patient's condition.    Ottie Glazier, M.D.  Pulmonary & Critical Care Medicine

## 2022-11-11 NOTE — Assessment & Plan Note (Addendum)
Patient received 5 units of packed red blood cells.  Empirically on Protonix.  Initially refused luminal evaluation.  Upon reevaluation the gastroenterologist felt that we can go without a procedure at this point.  Prescribed oral Protonix daily and asked them to buy over-the-counter omeprazole for the evening for 30 days only.  Last hemoglobin 8.1

## 2022-11-11 NOTE — Assessment & Plan Note (Signed)
Continue levothyroxine 

## 2022-11-11 NOTE — Assessment & Plan Note (Addendum)
Patient required Levophed initially.  Now back on her chronic midodrine.

## 2022-11-11 NOTE — Assessment & Plan Note (Addendum)
Follow-up with Dr. Janese Banks as outpatient.  Appreciate palliative care consultation.  Initially the patient was made a DO NOT RESUSCITATE but then she changed her mind.  She will follow-up with oncology as outpatient to discuss further treatment options.

## 2022-11-11 NOTE — Assessment & Plan Note (Addendum)
Status postthoracentesis and removal of 1.2 L on the right lung on 1/17.  This is likely secondary to lymphoma.

## 2022-11-11 NOTE — Progress Notes (Signed)
Patient transferred to unit from ICU. Patient oriented to unit, bed in lowest, locked position. Isolation precautions continued.

## 2022-11-11 NOTE — Progress Notes (Signed)
GI Inpatient Follow-up Note  Subjective:  Patient seen and had a normal bowel movement this morning with no overt GI bleeding.  Scheduled Inpatient Medications:   sodium chloride   Intravenous Once   acyclovir  400 mg Oral Daily   amoxicillin-clavulanate  1 tablet Oral Q12H   Chlorhexidine Gluconate Cloth  6 each Topical Daily   insulin aspart  0-15 Units Subcutaneous Q4H   levothyroxine  50 mcg Oral Q0600   midodrine  10 mg Oral TID WC   oseltamivir  30 mg Oral BID   oseltamivir  75 mg Oral Once   pantoprazole (PROTONIX) IV  40 mg Intravenous Q12H   thiamine (VITAMIN B1) injection  100 mg Intravenous Daily    Continuous Inpatient Infusions:    sodium chloride Stopped (11/09/22 1747)   sodium chloride      PRN Inpatient Medications:  sodium chloride, docusate sodium, guaiFENesin, morphine injection, mouth rinse, polyethylene glycol, sodium chloride flush  Review of Systems:  Review of Systems  Constitutional:  Negative for chills and fever.  Respiratory:  Positive for shortness of breath.   Cardiovascular:  Negative for chest pain.  Gastrointestinal:  Negative for blood in stool, constipation, diarrhea, nausea and vomiting.  Musculoskeletal:  Negative for joint pain.  Neurological:  Negative for focal weakness.  Psychiatric/Behavioral:  The patient is not nervous/anxious.   All other systems reviewed and are negative.     Physical Examination: BP 110/60 (BP Location: Left Arm)   Pulse 76   Temp 98 F (36.7 C) (Oral)   Resp 20   Ht '5\' 4"'$  (1.626 m)   Wt 72.9 kg   SpO2 100%   BMI 27.59 kg/m  Gen: Chronically ill appearing HEENT: PEERLA, EOMI Neck: supple Chest: visibly with mild respiratory distress, pursed lip breathing Abd: soft, non-tender Ext: no edema, well perfused with 2+ pulses, Skin: no rash or lesions noted  Data: Lab Results  Component Value Date   WBC 3.8 (L) 11/11/2022   HGB 7.6 (L) 11/11/2022   HCT 24.9 (L) 11/11/2022   MCV 90.1  11/11/2022   PLT 71 (L) 11/11/2022   Recent Labs  Lab 11/11/22 0500 11/11/22 1021 11/11/22 1145  HGB 7.9* 8.4* 7.6*   Lab Results  Component Value Date   NA 143 11/11/2022   K 4.2 11/11/2022   CL 105 11/11/2022   CO2 31 11/11/2022   BUN 29 (H) 11/11/2022   CREATININE 0.97 11/11/2022   Lab Results  Component Value Date   ALT 11 11/07/2022   AST 20 11/07/2022   ALKPHOS 123 11/07/2022   BILITOT 1.2 11/07/2022   Recent Labs  Lab 11/07/22 1254  INR 1.4*   Assessment/Plan: Ms. Pavone is a 77 y.o. 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 Differential includes PUD, ischemic colitis, and malignant bleeding. Patient is very high risk for any endoscopic procedure. Again discussed with patient and it seems as if she doesn't remember our conversation yesterday. Patient with normal bowel movement this morning. Will hold off on EGD for now given no recurrent GI bleeding and high risk comorbidities.  Recommendations:  - transfuse to keep hemoglobin > 7 - maintain two large bore IV's - continue PPI IV BID - if recurrent bleeding would recommend CTA - continued goals of care conversation although difficult with patient given  poor insight and understanding - will follow peripherally   Please call with any questions or concerns.  Raylene Miyamoto MD, MPH [place]

## 2022-11-11 NOTE — Consult Note (Signed)
Consultation Note Date: 11/11/2022   Patient Name: Natasha [name]  DOB: October 22, 1946  MRN: [id number]  Age / Sex: 77 y.o., female  PCP: [name], NP Referring Physician: Loletha Grayer, MD  Reason for Consultation: Establishing goals of care  HPI/Patient Profile: 77 y.o. female  with past medical history of dementia with paranoia, hypothyroidism, marginal zone lymphoma, s/p right-sided subclavian PAC, chronic lower extremity edema, COPD, HFpEF, diabetes, pancytopenia admitted on 11/07/2022 with hemorrhagic shock that has resolved but also with multifocal pneumonia and + influenza A. 7 admissions in past 6 months.   Clinical Assessment and Goals of Care: Consult received and extensive chart reviewed. I met today with Natasha [name]. She is alert and oriented to person, place, and mostly to situation. She tells me how sick she was when she first came to the hospital and she is feeling improved now. We discussed her baseline and she reports that she is able to do for herself but needs help. She has grandchildren that live with her.   We discussed her current health and some of the questions regarding her wishes and what she would want or not want. We discussed EGD and she fluctuates on wanting this or not. She would not want this if the risks outweigh the benefits. She tells me she doesn't want any procedures "if she doesn't need them." She struggles to understand that there are no guarantees to outcomes and findings for interventions. She is not clear if this is something she would desire if she were to decline further.   I also spoke with Natasha [name] regarding code status. She repeatedly tells me "no tubes" and confirms she would never want to be on a breathing machine or intubated. She tells me multiple times she would not want a tube in her throat if it cannot come out. I clarified that everybody and every situation is  different but I do agree that it would be difficult for her to successfully come off breathing machine. She continues to confirm no intubation. I explained to her that this would be a DNR and explained DNR status. She initially tells me that she wants CPR but I explained that if she dies and we do CPR this will not be helpful if we are not supporting her lungs to breathe as well. I explained that DNR is needed in order to prevent intubation so she agrees. She further tells me that she does not see why she would need a breathing tube if she is still able to help bath and cook for herself. I attempted to further explain again when and why these interventions would occur. She is unable to repeat back to me the consequences of these interventions despite explaining in multiple ways. She does not have capacity to make these decisions for herself unilaterally although she can certainly contribute to her wishes. She gives me permission to share our conversation with her daughter, Natasha [name], "but she cannot say to put in the tube."  At multiple times during our conversation Natasha [name]  makes her desire for quality of life clear. She tells me that if she is unable to do some for herself "just kill me." I explained that this is certainly not an option but I certainly respect that she desires independence and quality of life and if this were to decline we can offer her symptom management and comfort focused care in that scenario. I explained that this is how hospice can assist. She tells me that she does not want to go to hospice but is referring to a facility - she does not want to go to a facility at all. She reports that she would like to return home soon.   I called and discussed with daughter/HCPOA, Natasha [name]. Natasha [name] has good understanding of her mother's condition and overall poor prognosis. Natasha [name] has spoken with other providers during her mother's stay and expresses interest in having hospice help at home with her mother. I  explained further hospice philosophy and support usually meaning for care to be provided at home and avoid further hospitalization and would mean stopping her cancer treatment. Natasha [name] reports that her mother told her that she did not see the point in going through these treatments so Natasha [name] does not feel this will prevent hospice support at home. I encouraged her to speak more with her mother as I did not speak with her about this specifically today. I did share my conversation with her mother regarding no intubation and DNR. Natasha [name] shares that she knows her mother would never survive resuscitative measures and agrees with DNR. I explained to her that her mother agreed with DNR in some ways but has extremely poor understanding of what these interventions would mean but is insistent she would not want intubation which will require DNR to ensure this does not happen. Natasha [name] agrees with placing DNR at this time. Natasha [name] also agrees with not pursuing EGD at this time.   Update: I received update from RN that Natasha [name] was refusing DNR bracelet reporting that she did not agree with DNR and wants CPR but does not want intubation. I explained to RN that DNI is no longer an option in code status order and is not able to be documented outside hospital to prevent intubation. We cannot guarantee her desire not to be intubated will be honored without DNR in place. I have changed her back to full code and more decisions will need to be made in the future. Decisions will be difficult as I anticipate that her capacity may fluctuate and her answers may change from conversation to conversation.   All questions/concerns addressed to the best of my ability. Emotional support provided. Discussed with RN and medical team.   Primary Decision Maker HCPOA daughter Natasha [name]    SUMMARY OF RECOMMENDATIONS   - Goals do not align at this time - Further discussion needed - recommend to include Natasha [name] in conversations  Code Status/Advance Care  Planning: Full code - see above conversation   Symptom Management:  Per attending.   Prognosis:  < 6 months likely.   Discharge Planning: To Be Determined      Primary Diagnoses: Present on Admission:  Hemorrhagic shock ([place])   I have reviewed the medical record, interviewed the patient and family, and examined the patient. The following aspects are pertinent.  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   CHF (congestive heart failure) ([place])  COPD (chronic obstructive pulmonary disease) (HCC)    Diabetes mellitus without complication (HCC)    Hypotension    Pleural effusion    ARMc 840m,  2 weeks ago   Vaginal delivery    x 5   Social History   Socioeconomic History   Marital status: Widowed    Spouse name: Not on file   Number of children: 2   Years of education: Not on file   Highest education level: 9th grade  Occupational History   Occupation: unemployed  Tobacco Use   Smoking status: Former    Packs/day: 2.00    Years: 55.00    Total pack years: 110.00    Types: Cigarettes    Start date: 07/07/1961    Quit date: 08/02/2022    Years since quitting: 0.2   Smokeless tobacco: Never   Tobacco comments:    1/2 pack she states but family says 2 PPD  Vaping Use   Vaping Use: Never used  Substance and Sexual Activity   Alcohol use: No   Drug use: Never   Sexual activity: Not Currently    Partners: Male    Birth control/protection: Post-menopausal  Other Topics Concern   Not on file  Social History Narrative   08/14/20   [place]  Living: to be near daughter   Work: retired      FPhysiological scientistchildren - JIT consultant(nearby) and son JEvelena Peat[place]in [place]  8 grandchildren, & 2 great-grandchildren.      Enjoys: stays at home      Exercise: walking to her daughter's store   Diet: eats fruit, air fried chicken      Safety   Seat belts: Yes    Guns: No   Safe in relationships: Yes     Social Determinants of Health   Financial Resource Strain: Medium Risk (08/24/2022)   Overall Financial Resource Strain (CARDIA)    Difficulty of Paying Living Expenses: Somewhat hard  Food Insecurity: No Food Insecurity (11/08/2022)   Hunger Vital Sign    Worried About Running Out of Food in the Last Year: Never true    Ran Out of Food in the Last Year: Never true  Recent Concern: Food Insecurity - Food Insecurity Present (08/30/2022)   Hunger Vital Sign    Worried About Running Out of Food in the Last Year: Sometimes true    Ran Out of Food in the Last Year: Sometimes true  Transportation Needs: No Transportation Needs (11/08/2022)   PRAPARE - THydrologist(Medical): No    Lack of Transportation (Non-Medical): No  Recent Concern: Transportation Needs - Unmet Transportation Needs (08/30/2022)   PRAPARE - THydrologist(Medical): Yes    Lack of Transportation (Non-Medical): No  Physical Activity: Inactive (08/24/2022)   Exercise Vital Sign    Days of Exercise per Week: 0 days    Minutes of Exercise per Session: 0 min  Stress: Stress Concern Present (08/24/2022)   [place]   Feeling of Stress : To some extent  Social Connections: Socially Isolated (08/24/2022)   Social Connection and Isolation Panel [NHANES]    Frequency of Communication with Friends and Family: More than three times a week    Frequency of Social Gatherings with Friends and Family: More than three times a week    Attends Religious Services: Never    AMarine scientistor Organizations: No  Attends Archivist Meetings: Never    Marital Status: Widowed   Family History  Problem Relation Age of Onset   Breast cancer Mother    Alzheimer's disease Father    Lung cancer Father        lung   Varicose Veins Brother    Heart attack Brother    Scheduled Meds:  sodium chloride    Intravenous Once   acyclovir  400 mg Oral Daily   amoxicillin-clavulanate  1 tablet Oral Q12H   Chlorhexidine Gluconate Cloth  6 each Topical Daily   insulin aspart  0-15 Units Subcutaneous Q4H   levothyroxine  50 mcg Oral Q0600   midodrine  10 mg Oral TID WC   oseltamivir  30 mg Oral BID   oseltamivir  75 mg Oral Once   pantoprazole (PROTONIX) IV  40 mg Intravenous Q12H   thiamine (VITAMIN B1) injection  100 mg Intravenous Daily   Continuous Infusions:  sodium chloride Stopped (11/09/22 1747)   sodium chloride     PRN Meds:.sodium chloride, docusate sodium, guaiFENesin, morphine injection, mouth rinse, polyethylene glycol, sodium chloride flush No Known Allergies Review of Systems  Constitutional:  Positive for activity change, appetite change and fatigue.  Neurological:  Positive for weakness.    Physical Exam Vitals and nursing note reviewed.  Constitutional:      General: She is not in acute distress.    Appearance: She is ill-appearing.  Cardiovascular:     Rate and Rhythm: Normal rate.  Pulmonary:     Effort: No tachypnea, accessory muscle usage or respiratory distress.     Comments: Short of breath at rest on occasion Abdominal:     Palpations: Abdomen is soft.  Neurological:     Mental Status: She is alert and oriented to person, place, and time.     Comments: Poor insight into consequences of decisions     Vital Signs: BP (!) 104/56   Pulse 79   Temp 98.2 F (36.8 C)   Resp 19   Ht '5\' 4"'$  (1.626 m)   Wt 72.9 kg   SpO2 100%   BMI 27.59 kg/m  Pain Scale: 0-10   Pain Score: 0-No pain   SpO2: SpO2: 100 % O2 Device:SpO2: 100 % O2 Flow Rate: .O2 Flow Rate (L/min): 2 L/min  IO: Intake/output summary:  Intake/Output Summary (Last 24 hours) at 11/11/2022 1232 Last data filed at 11/11/2022 1200 Gross per 24 hour  Intake 1110 ml  Output 2160 ml  Net -1050 ml    LBM: Last BM Date : 11/10/22 Baseline Weight: Weight: 73.3 kg Most recent weight: Weight:  72.9 kg     Palliative Assessment/Data:     Time In: 1519  Time Total: 90 min  Greater than 50%  of this time was spent counseling and coordinating care related to the above assessment and plan.  Signed by: Vinie Sill, NP Palliative Medicine Team Pager # [phone] (M-F 8a-5p) Team Phone # [phone] (Nights/Weekends)

## 2022-11-11 NOTE — Assessment & Plan Note (Addendum)
Patient chronically on 4 L of oxygen, history of COPD.

## 2022-11-11 NOTE — Progress Notes (Signed)
Progress Note   Patient: Natasha [name] XLK:[id number] DOB: December 30, 1945 DOA: 11/07/2022     4 DOS: the patient was seen and examined on 11/11/2022   Brief hospital course: 77 year old female with a history of marginal cell lymphoma status post having right-sided subclavian Port-A-Cath, chronic lower extremity edema, COPD with nicotine dependence, type 2 diabetes, history of pancytopenia, recurrent pleural effusions status postthoracentesis and pneumothorax.  Patient admitted with hemorrhagic shock requiring blood transfusion with requirement for Levophed.  Pulmonary critical care asked for admission due to shock physiology and acute blood loss anemia.   11/08/22- patient was unable to have GI procedure due to medical instability. She has poor prognosis overall, she relates she does not want to have intubation or CPR.  I will call palliative consultation.  There is plan for thoracentesis.  11/09/22- patient stopped bleeding and is more stable off vasopressors.  11/10/22- patient refusing all care.  Will order palliative consultation and recommendation for hospice.  Patient had thoracentesis 1.2 L drawn off underneath the right lung.. She is declining endoluminal evaluation by GI 1/18.  Medical service taking over care.  Hemoglobin on the lower side.  Patient tolerated diet this morning.  Patient asked about endoscopy today.  Should she ate breakfast she will not get it today.  Spoke with GI and they will see her and may be reassess for tomorrow  Assessment and Plan: * Hemorrhagic shock ([place]) Patient required Levophed initially.  Now on midodrine.  Continue to monitor blood pressure.  Acute blood loss anemia Patient received 5 units of packed red blood cells.  Empirically on Protonix.  Initially refused luminal evaluation.  Spoke with GI to evaluate today to see if she is interested and luminal evaluation for tomorrow.  Last hemoglobin 7.6.  Influenza A with pneumonia Continue Tamiflu.  Initially was on  Rocephin and Zithromax but now on Augmentin  Chronic bilateral pleural effusions Status postthoracentesis and removal of 1.2 L on the right lung on 1/17.  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 Janese Banks oncology to see patient.  Appreciate palliative consultation.  Palliative care made the patient a DNR.  Palliative care spoke with patient's daughter about hospice care.  Type 2 diabetes mellitus without complication, with long-term current use of insulin (HCC) Currently on sliding scale insulin  Thrombocytopenia (HCC) Continue to monitor.  Hypothyroidism, unspecified Continue levothyroxine  Chronic respiratory failure with hypoxia (HCC) Patient chronically on 4 L of oxygen, history of COPD.        Subjective: Patient feels okay today feels better after thoracentesis.  Patient asked when her endoscopy was going to be.  Gastroenterology will come back and talk more about that with the patient.  Physical Exam: Vitals:   11/11/22 1300 11/11/22 1400 11/11/22 1500 11/11/22 1618  BP: 113/60 (!) 117/91 98/60 110/60  Pulse: 75 81 78 76  Resp: '12 17 16 20  '$ Temp: 98.2 F (36.8 C) 98.2 F (36.8 C) 98.1 F (36.7 C) 98 F (36.7 C)  TempSrc:    Oral  SpO2: 100% 100% 100% 100%  Weight:      Height:       Physical Exam HENT:     Head: Normocephalic.     Mouth/Throat:     Pharynx: No oropharyngeal exudate.  Eyes:     General: Lids are normal.     Conjunctiva/sclera: Conjunctivae normal.  Cardiovascular:     Rate and Rhythm: Normal rate and regular rhythm.     Heart sounds: Normal heart sounds, S1  normal and S2 normal.  Pulmonary:     Breath sounds: Examination of the right-lower field reveals decreased breath sounds and wheezing. Examination of the left-lower field reveals decreased breath sounds and wheezing. Decreased breath sounds and wheezing present. No rhonchi or rales.  Abdominal:     Palpations: Abdomen is soft.     Tenderness: There is no abdominal tenderness.   Musculoskeletal:     Right lower leg: No swelling.     Left lower leg: No swelling.  Skin:    General: Skin is warm.     Findings: No rash.  Neurological:     Mental Status: She is alert and oriented to person, place, and time.     Data Reviewed: Creatinine 0.97, last hemoglobin 7.6, platelet count 71.  Family Communication: Updated daughter on the phone  Disposition: Status is: Inpatient Remains inpatient appropriate because: Will have GI reevaluate to talk about endoscopy.  Transfuse if less than 7 hemoglobin.  Planned Discharge Destination: Home    Time spent: 32 minutes Case discussed with palliative care  Author: Loletha Grayer, MD 11/11/2022 4:27 PM  For on call review [email or web address].

## 2022-11-11 NOTE — Assessment & Plan Note (Addendum)
Hemoglobin A1c actually on the lower side can go back on the lower dose 70/30 insulin at home.

## 2022-11-11 NOTE — Assessment & Plan Note (Addendum)
Completed Tamiflu.  1 more day of Augmentin upon going home.

## 2022-11-11 NOTE — Assessment & Plan Note (Addendum)
Continue to monitor as outpatient.  Last platelet count 77.

## 2022-11-12 ENCOUNTER — Ambulatory Visit: Payer: Medicare Other | Admitting: Nurse Practitioner

## 2022-11-12 DIAGNOSIS — E1122 Type 2 diabetes mellitus with diabetic chronic kidney disease: Secondary | ICD-10-CM

## 2022-11-12 DIAGNOSIS — J90 Pleural effusion, not elsewhere classified: Secondary | ICD-10-CM | POA: Diagnosis not present

## 2022-11-12 DIAGNOSIS — J09X1 Influenza due to identified novel influenza A virus with pneumonia: Secondary | ICD-10-CM | POA: Diagnosis not present

## 2022-11-12 DIAGNOSIS — Z515 Encounter for palliative care: Secondary | ICD-10-CM | POA: Diagnosis not present

## 2022-11-12 DIAGNOSIS — K922 Gastrointestinal hemorrhage, unspecified: Secondary | ICD-10-CM

## 2022-11-12 DIAGNOSIS — D62 Acute posthemorrhagic anemia: Secondary | ICD-10-CM | POA: Diagnosis not present

## 2022-11-12 DIAGNOSIS — R578 Other shock: Secondary | ICD-10-CM | POA: Diagnosis not present

## 2022-11-12 LAB — GLUCOSE, CAPILLARY
Glucose-Capillary: 120 mg/dL — ABNORMAL HIGH (ref 70–99)
Glucose-Capillary: 134 mg/dL — ABNORMAL HIGH (ref 70–99)
Glucose-Capillary: 225 mg/dL — ABNORMAL HIGH (ref 70–99)
Glucose-Capillary: 80 mg/dL (ref 70–99)

## 2022-11-12 LAB — CBC
HCT: 26.2 % — ABNORMAL LOW (ref 36.0–46.0)
Hemoglobin: 8.1 g/dL — ABNORMAL LOW (ref 12.0–15.0)
MCH: 28 pg (ref 26.0–34.0)
MCHC: 30.9 g/dL (ref 30.0–36.0)
MCV: 90.7 fL (ref 80.0–100.0)
Platelets: 77 10*3/uL — ABNORMAL LOW (ref 150–400)
RBC: 2.89 MIL/uL — ABNORMAL LOW (ref 3.87–5.11)
RDW: 22.1 % — ABNORMAL HIGH (ref 11.5–15.5)
WBC: 3.6 10*3/uL — ABNORMAL LOW (ref 4.0–10.5)
nRBC: 0.6 % — ABNORMAL HIGH (ref 0.0–0.2)

## 2022-11-12 LAB — BASIC METABOLIC PANEL
Anion gap: 4 — ABNORMAL LOW (ref 5–15)
BUN: 25 mg/dL — ABNORMAL HIGH (ref 8–23)
CO2: 30 mmol/L (ref 22–32)
Calcium: 7.8 mg/dL — ABNORMAL LOW (ref 8.9–10.3)
Chloride: 104 mmol/L (ref 98–111)
Creatinine, Ser: 0.88 mg/dL (ref 0.44–1.00)
GFR, Estimated: >60 mL/min (ref >60)
Glucose, Bld: 109 mg/dL — ABNORMAL HIGH (ref 70–99)
Potassium: 4.2 mmol/L (ref 3.5–5.1)
Sodium: 138 mmol/L (ref 135–145)

## 2022-11-12 LAB — CYTOLOGY - NON PAP

## 2022-11-12 LAB — HEMOGLOBIN AND HEMATOCRIT, BLOOD
HCT: 24.9 % — ABNORMAL LOW (ref 36.0–46.0)
Hemoglobin: 7.6 g/dL — ABNORMAL LOW (ref 12.0–15.0)

## 2022-11-12 MED ORDER — HEPARIN SOD (PORK) LOCK FLUSH 100 UNIT/ML IV SOLN
500.0000 [IU] | Freq: Once | INTRAVENOUS | Status: AC
  Administered 2022-11-12: 500 [IU] via INTRAVENOUS
  Filled 2022-11-12: qty 5

## 2022-11-12 MED ORDER — PANTOPRAZOLE SODIUM 40 MG PO TBEC: 40.0000 mg | DELAYED_RELEASE_TABLET | Freq: Every day | ORAL | 0 refills | Status: DC

## 2022-11-12 MED ORDER — AMOXICILLIN-POT CLAVULANATE 875-125 MG PO TABS: 1.0000 | ORAL_TABLET | Freq: Two times a day (BID) | ORAL | 0 refills | Status: AC

## 2022-11-12 MED ORDER — INSULIN NPH ISOPHANE & REGULAR (70-30) 100 UNIT/ML ~~LOC~~ SUSP: 4.0000 [IU] | Freq: Two times a day (BID) | SUBCUTANEOUS | 0 refills | Status: DC

## 2022-11-12 NOTE — Evaluation (Signed)
Physical Therapy Evaluation Patient Details Name: Natasha [name] MRN: [id number] DOB: 28-Nov-1945 Today's Date: 11/12/2022  History of Present Illness  Pt admitted for hemorrhagic shock secondary to upper GI bleed and drop in Hgb. History includes stage 4 lymphoma, AKI, MI, DM, COPD and is currently on 4L of O2.  Clinical Impression  Pt is a pleasant 77 year old female who was admitted for hemorrhagic shock secondary to upper GI bleed. Pt reports she has had multiple recent falls. Pt performs bed mobility with mod I and transfers with cga and RW. Prefers not to ambulate at this time due to breakfast tray arriving. Pt demonstrates deficits with strength/mobility/balance. Would benefit from skilled PT to address above deficits and promote optimal return to PLOF. Recommend transition to [facility] upon discharge from acute hospitalization.      Recommendations for follow up therapy are one component of a multi-disciplinary discharge planning process, led by the attending physician.  Recommendations may be updated based on patient status, additional functional criteria and insurance authorization.  Follow Up Recommendations Home health PT      Assistance Recommended at Discharge Intermittent Supervision/Assistance  Patient can return home with the following  A little help with walking and/or transfers;A little help with bathing/dressing/bathroom;Assist for transportation;Assistance with cooking/housework    Equipment Recommendations None recommended by PT  Recommendations for Other Services       Functional Status Assessment Patient has had a recent decline in their functional status and demonstrates the ability to make significant improvements in function in a reasonable and predictable amount of time.     Precautions / Restrictions Precautions Precautions: Fall Precaution Comments: watch O2 Restrictions Weight Bearing Restrictions: No      Mobility  Bed Mobility Overal bed mobility:  Modified Independent             General bed mobility comments: safe technique with ease of transfer. Slightly impulsive and tries to sit at EOB prior to permission.    Transfers Overall transfer level: Needs assistance Equipment used: Rolling walker (2 wheels) Transfers: Sit to/from Stand, Bed to chair/wheelchair/BSC Sit to Stand: Min guard   Step pivot transfers: Min guard       General transfer comment: needs cues for sequencing and safety. RW used    Ambulation/Gait               General Gait Details: pt preferred not to ambulate this date as her breakfast arrived  Science writer    Modified Rankin (Stroke Patients Only)       Balance Overall balance assessment: Needs assistance, History of Falls Sitting-balance support: Feet supported, No upper extremity supported Sitting balance-Leahy Scale: Good     Standing balance support: Bilateral upper extremity supported Standing balance-Leahy Scale: Fair                               Pertinent Vitals/Pain Pain Assessment Pain Assessment: Faces Faces Pain Scale: Hurts even more Pain Location: tailbone from recent fall Pain Descriptors / Indicators: Discomfort Pain Intervention(s): Limited activity within patient's tolerance, Repositioned    Home Living Family/patient expects to be discharged to:: Private residence Living Arrangements: Other relatives Available Help at Discharge: Family Type of Home: House Home Access: Stairs to enter Entrance Stairs-Rails: None Entrance Stairs-Number of Steps: 2-3   Home Layout: One level Home Equipment: Conservation officer, nature (2 wheels);Cane - single point;Hospital  bed;BSC/3in1 Additional Comments: 4L O2    Prior Function Prior Level of Function : Needs assist;History of Falls (last six months)             Mobility Comments: reports multiple recent falls ADLs Comments: needs assist for ADLs     Hand Dominance         Extremity/Trunk Assessment   Upper Extremity Assessment Upper Extremity Assessment: Generalized weakness (B UE grossly 4/5)    Lower Extremity Assessment Lower Extremity Assessment: Generalized weakness (B LE grossly 3/5)       Communication   Communication: No difficulties  Cognition Arousal/Alertness: Awake/alert Behavior During Therapy: WFL for tasks assessed/performed Overall Cognitive Status: Within Functional Limits for tasks assessed                                 General Comments: pleasant and agreeable to session        General Comments      Exercises     Assessment/Plan    PT Assessment Patient needs continued PT services  PT Problem List Decreased strength;Decreased activity tolerance;Decreased balance;Decreased safety awareness;Cardiopulmonary status limiting activity;Pain;Decreased knowledge of use of DME;Decreased mobility       PT Treatment Interventions DME instruction;Gait training;Functional mobility training;Stair training;Therapeutic activities;Balance training;Therapeutic exercise;Patient/family education    PT Goals (Current goals can be found in the Care Plan section)  Acute Rehab PT Goals Patient Stated Goal: Get breathing better and go home PT Goal Formulation: With patient Time For Goal Achievement: 11/26/22 Potential to Achieve Goals: Good    Frequency Min 2X/week     Co-evaluation               AM-PAC PT "6 Clicks" Mobility  Outcome Measure Help needed turning from your back to your side while in a flat bed without using bedrails?: None Help needed moving from lying on your back to sitting on the side of a flat bed without using bedrails?: None Help needed moving to and from a bed to a chair (including a wheelchair)?: A Little Help needed standing up from a chair using your arms (e.g., wheelchair or bedside chair)?: A Little Help needed to walk in hospital room?: A Little Help needed climbing 3-5 steps with a  railing? : A Lot 6 Click Score: 19    End of Session Equipment Utilized During Treatment: Gait belt;Oxygen Activity Tolerance: Patient limited by fatigue Patient left: in chair (chair alarm plugged in, not currently working. Left door open and CNA notified pt in recliner) Nurse Communication: Mobility status PT Visit Diagnosis: Muscle weakness (generalized) (M62.81);Unsteadiness on feet (R26.81);Difficulty in walking, not elsewhere classified (R26.2)    Time: 9381-0175 PT Time Calculation (min) (ACUTE ONLY): 16 min   Charges:   PT Evaluation $PT Eval Low Complexity: 1 Low          Greggory Stallion, PT, DPT, GCS [phone]   Venora Kautzman 11/12/2022, 12:06 PM

## 2022-11-12 NOTE — TOC Transition Note (Signed)
Transition of Care [facility]) - CM/SW Discharge Note   Patient Details  Name: Natasha [name] MRN: [id number] Date of Birth: 11-Jun-1946  Transition of Care [facility]) CM/SW Contact:  Magnus Ivan, LCSW Phone Number: 11/12/2022, 12:13 PM   Clinical Narrative:    Patient to DC home today. Needs Home Health per MD. Confirmed home address. Per chart review, patient was recently set up with [facility]. Called [place] with [facility] who confirmed they are already active with patient. Need resumption orders for HHPT, OT, RN. Notified MD.     Final next level of care: Home w Home Health Services Barriers to Discharge: Barriers Resolved   Patient Goals and CMS Choice CMS Medicare.gov Compare Post Acute Care list provided to:: Patient Choice offered to / list presented to : Patient  Discharge Placement                    Name of family member notified: patient Patient and family notified of of transfer: 11/12/22  Discharge Plan and Services Additional resources added to the After Visit Summary for     Discharge Planning Services: CM Consult Post Acute Care Choice: Resumption of Svcs/PTA Provider          DME Arranged: N/A DME Agency: NA       HH Arranged: PT, OT HH Agency: [place] Date [place]: 11/12/22 Time [place]: [id number] Representative spoke with at [place]: [place]  Social Determinants of Health ([place]) Interventions [place]: No Food Insecurity (11/08/2022)  Recent Concern: [place] Present (08/30/2022)  Housing: Low Risk  (11/08/2022)  Transportation Needs: No Transportation Needs (11/08/2022)  Recent Concern: Transportation Needs - Unmet Transportation Needs (08/30/2022)  Utilities: Not At Risk (11/08/2022)  Recent Concern: Utilities - At Risk (10/06/2022)  Alcohol Screen: Low Risk  (08/24/2022)  Depression (PHQ2-9): Low Risk  (08/24/2022)  Financial Resource Strain:  Medium Risk (08/24/2022)  Physical Activity: Inactive (08/24/2022)  Social Connections: Socially Isolated (08/24/2022)  Stress: Stress Concern Present (08/24/2022)  Tobacco Use: Medium Risk (11/07/2022)     Readmission Risk Interventions    11/08/2022   10:53 AM 10/21/2022    1:13 PM 08/17/2022   11:05 AM  Readmission Risk Prevention Plan  Transportation Screening Complete Complete Complete  PCP or Specialist Appt within 3-5 Days   Complete  HRI or [place]   Complete  Social Work Consult for [place] Planning/Counseling   Complete  Palliative Care Screening   Not Applicable  Medication Review Press photographer) Complete Complete Complete  PCP or Specialist appointment within 3-5 days of discharge Complete Complete   HRI or [place] Complete Complete   SW Recovery Care/Counseling Consult Complete Complete   Palliative Care Screening Complete Not [place] Not Applicable Not Applicable

## 2022-11-12 NOTE — Care Management Important Message (Signed)
Important Message  Patient Details  Name: Natasha [name] MRN: [id number] Date of Birth: 02-10-1946   Medicare Important Message Given:  Other (see comment)  Attempted to reach patient via room phone [phone]) to review Medicare IM.  Line busy upon attempt.    Dannette Barbara 11/12/2022, 12:40 PM

## 2022-11-12 NOTE — Progress Notes (Signed)
Discharge instructions reviewed with patient including followup visits and new medications.  Understanding was verbalized and all questions were answered.  IVs removed and port deaccessed without complication; patient tolerated well.  Awaiting ride.

## 2022-11-12 NOTE — Discharge Summary (Signed)
Physician Discharge Summary   Patient: Natasha [name] MRN: [id number] DOB: 02/21/1946  Admit date:     11/07/2022  Discharge date: 11/12/22  Discharge Physician: Loletha Grayer   PCP: [name], NP   Recommendations at discharge:   Follow-up PCP 5 days Follow-up Dr. Janese Banks oncology  Discharge Diagnoses: Principal Problem:   Hemorrhagic shock [facility]) Active Problems:   Acute blood loss anemia   Influenza A with pneumonia   Chronic bilateral pleural effusions   Nodal marginal zone B-cell lymphoma (HCC)   Type 2 diabetes mellitus without complication, with long-term current use of insulin (HCC)   Thrombocytopenia (HCC)   Hypothyroidism, unspecified   Chronic respiratory failure with hypoxia (HCC)   Acute GI bleeding   Hospital Course: 77 year old female with a history of marginal cell lymphoma status post having right-sided subclavian Port-A-Cath, chronic lower extremity edema, COPD with nicotine dependence, type 2 diabetes, history of pancytopenia, recurrent pleural effusions status postthoracentesis and pneumothorax.  Patient admitted with hemorrhagic shock requiring blood transfusion with requirement for Levophed.  Pulmonary critical care asked for admission due to shock physiology and acute blood loss anemia.   11/08/22- patient was unable to have GI procedure due to medical instability. She has poor prognosis overall, she relates she does not want to have intubation or CPR.  I will call palliative consultation.  There is plan for thoracentesis.  11/09/22- patient stopped bleeding and is more stable off vasopressors.  11/10/22- patient refusing all care.  Will order palliative consultation and recommendation for hospice.  Patient had thoracentesis 1.2 L drawn off underneath the right lung.. She is declining endoluminal evaluation by GI 1/18.  Medical service taking over care.  Hemoglobin on the lower side.  Patient tolerated diet this morning.  Patient asked about endoscopy today.   Reevaluated by gastroenterology and felt that the patient did not need any further procedure at this time. 1/19.  Patient wanted to go home.  Seen by physical therapy recommended home health.  Patient was able to stand up for me without assistance and walk in place without a problem.  Home health set up.  Assessment and Plan: * Hemorrhagic shock ([place]) Patient required Levophed initially.  Now back on her chronic midodrine.    Acute blood loss anemia Patient received 5 units of packed red blood cells.  Empirically on Protonix.  Initially refused luminal evaluation.  Upon reevaluation the gastroenterologist felt that we can go without a procedure at this point.  Prescribed oral Protonix daily and asked them to buy over-the-counter omeprazole for the evening for 30 days only.  Last hemoglobin 8.1  Influenza A with pneumonia Completed Tamiflu.  1 more day of Augmentin upon going home.  Chronic bilateral pleural effusions Status postthoracentesis and removal of 1.2 L on the right lung on 1/17.  This is likely secondary to lymphoma.  Nodal marginal zone B-cell lymphoma ([place]) Follow-up with Dr. Janese Banks as outpatient.  Appreciate palliative care consultation.  Initially the patient was made a DO NOT RESUSCITATE but then she changed her mind.  She will follow-up with oncology as outpatient to discuss further treatment options.  Type 2 diabetes mellitus without complication, with long-term current use of insulin (HCC) Hemoglobin A1c actually on the lower side can go back on the lower dose 70/30 insulin at home.  Thrombocytopenia ([place]) Continue to monitor as outpatient.  Last platelet count 77.  Hypothyroidism, unspecified Continue levothyroxine  Chronic respiratory failure with hypoxia ([place]) Patient chronically on 4 L of oxygen, history of  COPD.         Consultants: Oncology, palliative care, critical care, gastroenterology, interventional radiology Procedures performed: Right-sided  thoracentesis Disposition: Home health Diet recommendation:  Cardiac and Carb modified diet DISCHARGE MEDICATION: Allergies as of 11/12/2022   No Known Allergies      Medication List     STOP taking these medications    Calquence 100 MG tablet Generic drug: acalabrutinib maleate       TAKE these medications    acyclovir 400 MG tablet Commonly known as: ZOVIRAX Take 400 mg by mouth daily.   albuterol 108 (90 Base) MCG/ACT inhaler Commonly known as: VENTOLIN HFA TAKE 2 PUFFS BY MOUTH EVERY 6 HOURS AS NEEDED FOR WHEEZE OR SHORTNESS OF BREATH What changed:  how much to take how to take this when to take this reasons to take this   amoxicillin-clavulanate 875-125 MG tablet Commonly known as: AUGMENTIN Take 1 tablet by mouth every 12 (twelve) hours for 1 day.   clobetasol cream 0.05 % Commonly known as: TEMOVATE Apply 1 Application topically daily.   furosemide 20 MG tablet Commonly known as: LASIX Take 1 tablet (20 mg total) by mouth daily.   insulin NPH-regular Human (70-30) 100 UNIT/ML injection Inject 4 Units into the skin 2 (two) times daily with a meal. Use only for hyperglycemia, CBG+ 300. Start with 5 units, titrate up to max 10 unit per dose if need. What changed: how much to take   ipratropium-albuterol 0.5-2.5 (3) MG/3ML Soln Commonly known as: DUONEB Take 3 mLs by nebulization every 6 (six) hours as needed (shortness of breath or wheezing).   levothyroxine 50 MCG tablet Commonly known as: SYNTHROID Take 1 tablet (50 mcg total) by mouth daily at 6 (six) AM.   loperamide 2 MG capsule Commonly known as: IMODIUM Take 2 mg by mouth as needed for diarrhea or loose stools.   midodrine 10 MG tablet Commonly known as: PROAMATINE Take 1 tablet (10 mg total) by mouth 3 (three) times daily with meals.   ondansetron 8 MG tablet Commonly known as: ZOFRAN Take 8 mg by mouth every 8 (eight) hours as needed for nausea, vomiting or refractory nausea /  vomiting. Take 1 tablet q6H for 4 days after chemotherapy.   pantoprazole 40 MG tablet Commonly known as: Protonix Take 1 tablet (40 mg total) by mouth daily.   prochlorperazine 10 MG tablet Commonly known as: COMPAZINE Take 1 tablet by mouth every 6 (six) hours as needed for nausea or vomiting. Take 1 tab q6H for 4 days after chemotherapy.   simethicone 80 MG chewable tablet Commonly known as: MYLICON Chew 790 mg by mouth every 6 (six) hours as needed for flatulence. Take 1 capsule PO PRN gas relief.        Follow-up Information     [name], NP. Schedule an appointment as soon as possible for a visit in 5 day(s).   Specialties: Nurse Practitioner, Family Medicine Why: Was unable to make appointment. Make appointment 5 days from discharge. Contact information: [place] [address] [phone]         Sindy Guadeloupe, MD Follow up on 11/16/2022.   Specialty: Oncology Why: Appointment on 11/16/22 @ 10:45 am. Contact information: [place] [address] [phone]                Discharge Exam: Benedict Weights   11/09/22 0415 11/10/22 0446 11/11/22 0447  Weight: 69.4 kg 70.4 kg 72.9 kg   Physical  Exam HENT:     Head: Normocephalic.     Mouth/Throat:     Pharynx: No oropharyngeal exudate.  Eyes:     General: Lids are normal.     Conjunctiva/sclera: Conjunctivae normal.  Cardiovascular:     Rate and Rhythm: Normal rate and regular rhythm.     Heart sounds: Normal heart sounds, S1 normal and S2 normal.  Pulmonary:     Breath sounds: Examination of the right-lower field reveals decreased breath sounds and wheezing. Examination of the left-lower field reveals decreased breath sounds and wheezing. Decreased breath sounds and wheezing present. No rhonchi or rales.  Abdominal:     Palpations: Abdomen is soft.     Tenderness: There is no abdominal tenderness.  Musculoskeletal:     Right lower leg: No swelling.     Left  lower leg: No swelling.  Skin:    General: Skin is warm.     Findings: No rash.  Neurological:     Mental Status: She is alert and oriented to person, place, and time.      Condition at discharge: fair  The results of significant diagnostics from this hospitalization (including imaging, microbiology, ancillary and laboratory) are listed below for reference.   Imaging Studies: US THORACENTESIS ASP PLEURAL SPACE W/IMG GUIDE  Result Date: 11/11/2022 INDICATION: Pleural effusion EXAM: ULTRASOUND GUIDED RIGHT THORACENTESIS MEDICATIONS: 8 cc 1% lidocaine COMPLICATIONS: None immediate. PROCEDURE: An ultrasound guided thoracentesis was thoroughly discussed with the patient and questions answered. The benefits, risks, alternatives and complications were also discussed. The patient understands and wishes to proceed with the procedure. Written consent was obtained. Ultrasound was performed to localize and mark an adequate pocket of fluid in the right chest. The area was then prepped and draped in the normal sterile fashion. 1% Lidocaine was used for local anesthesia. Under ultrasound guidance a 6 Fr Safe-T-Centesis catheter was introduced. Thoracentesis was performed. The catheter was removed and a dressing applied. FINDINGS: A total of approximately 1.2 L of hazy amber fluid was removed. Samples were sent to the laboratory as requested by the clinical team. IMPRESSION: Successful ultrasound guided right thoracentesis yielding 1.2 L of pleural fluid. Follow-up chest x-ray revealed no evidence of pneumothorax. Read by: Reatha Armour, PA-C Electronically Signed   By: Jerilynn Mages.  Shick M.D.   On: 11/11/2022 07:54   DG Chest Port 1 View  Result Date: 11/10/2022 CLINICAL DATA:  Status post right thoracentesis, pleural effusion. EXAM: PORTABLE CHEST 1 VIEW COMPARISON:  Chest CT 11/07/2022 and chest radiograph from the same date FINDINGS: Power injectable right Port-A-Cath tip: SVC. Moderate bilateral pleural effusions  are observed. No visible pneumothorax. Some improvement in aeration in the right mid lung likely reflects reduced pleural fluid related to the thoracentesis. Upper normal heart size. Indistinct pulmonary vasculature, cannot exclude pulmonary venous hypertension and mild edema. Rounded density in the right mid lung compatible with pulmonary nodule (previously shown to be partially calcified). IMPRESSION: 1. No pneumothorax status post right thoracentesis. 2. Moderate bilateral pleural effusions. 3. Indistinct pulmonary vasculature, cannot exclude pulmonary venous hypertension and mild edema. 4. Rounded density in the right mid lung compatible with a pulmonary nodule (previously shown to be partially calcified). 5. Upper normal heart size. Electronically Signed   By: Van Clines M.D.   On: 11/10/2022 16:30   CT CHEST ABDOMEN PELVIS W CONTRAST  Result Date: 11/07/2022 CLINICAL DATA:  Sepsis. Dementia. Lymphoma. Hemorrhagic shock requiring blood transfusions and pressor support. * Tracking Code: BO * EXAM: CT CHEST, ABDOMEN,  AND PELVIS WITH CONTRAST TECHNIQUE: Multidetector CT imaging of the chest, abdomen and pelvis was performed following the standard protocol during bolus administration of intravenous contrast. RADIATION DOSE REDUCTION: This exam was performed according to the departmental dose-optimization program which includes automated exposure control, adjustment of the mA and/or kV according to patient size and/or use of iterative reconstruction technique. CONTRAST:  158m OMNIPAQUE IOHEXOL 300 MG/ML  SOLN COMPARISON:  Chest radiograph from earlier today. 09/19/2022 CT abdomen/pelvis. 07/12/2022 chest CT angiogram. FINDINGS: CT CHEST FINDINGS Cardiovascular: Normal heart size. No significant pericardial effusion/thickening. Left anterior descending coronary atherosclerosis. Right internal jugular Port-A-Cath terminates at the cavoatrial junction. Atherosclerotic nonaneurysmal thoracic aorta. Normal  caliber pulmonary arteries. No central pulmonary emboli. Mediastinum/Nodes: No significant thyroid nodules. Unremarkable esophagus. Widespread mild to moderate bilateral axillary, bilateral lower neck, bilateral mediastinal and bilateral hilar lymphadenopathy, overall mildly decreased. Representative 1.6 cm right axillary node (series 3/image 23), decreased from 2.0 cm. Representative 1.7 cm right paratracheal node (series 3/image 19), mildly decreased from 1.9 cm. Representative 1.6 cm AP window node (series 3/image 20), stable. Lungs/Pleura: No pneumothorax. Moderate to large right and small to moderate left dependent pleural effusions with mild smooth bilateral pleural thickening and enhancement. New patchy consolidation in the posterior right upper lobe. New prominent patchy tree-in-bud opacities throughout the bilateral upper lobes. Calcified 1.9 cm posterior right upper lobe nodule is stable. Moderate compressive atelectasis in the posterior lower lobes bilaterally. Musculoskeletal: No aggressive appearing focal osseous lesions. Mild thoracic spondylosis. Mild anasarca. CT ABDOMEN PELVIS FINDINGS Hepatobiliary: Normal liver with no liver mass. Gallstones within the contracted gallbladder with no definite gallbladder wall thickening. No biliary ductal dilatation. Pancreas: Normal, with no mass or duct dilation. Spleen: Marked splenomegaly measuring 21.5 cm craniocaudal length, previously 19.7 cm, mildly increased. No splenic masses. Adrenals/Urinary Tract: Normal adrenals. Normal kidneys with no hydronephrosis and no renal mass. Well-positioned Foley catheter within the bladder. Expected minimal gas in the nondependent bladder from instrumentation. Chronic mild diffuse bladder wall thickening. Stomach/Bowel: Normal non-distended stomach. Normal caliber small bowel loops. Appendix not discretely visualized. Mild wall thickening in the nondilated terminal ileum and cecum in the right lower quadrant. No additional  sites of small or large bowel wall thickening. No significant diverticulosis. Vascular/Lymphatic: Atherosclerotic nonaneurysmal abdominal aorta. Patent portal, splenic, hepatic and renal veins. Widespread moderate lymphadenopathy throughout the retroperitoneum and bilateral pelvis is overall slightly decreased from 09/19/2022 CT. Representative 2.3 cm portacaval node (series 3/image 63), decreased from 2.5 cm. Representative 1.7 cm left para-aortic node (series 3/image 69), decreased from 2.0 cm. Representative 1.8 cm right inguinal node (series 3/image 111), decreased from 2.0 cm. Reproductive: Grossly normal uterus.  No adnexal mass. Other: No pneumoperitoneum. No focal fluid collection. Moderate anasarca. Musculoskeletal: No aggressive appearing focal osseous lesions. Chronic L2 and L3 vertebral compression fractures. Mild-to-moderate lumbar degenerative disc disease. IMPRESSION: 1. Moderate to large right and small to moderate left dependent pleural effusions with mild smooth bilateral pleural thickening and enhancement. 2. New patchy consolidation in the posterior right upper lobe. New prominent patchy tree-in-bud opacities throughout the bilateral upper lobes. Findings are most compatible with multilobar bronchopneumonia. 3. Widespread lymphadenopathy in the chest, abdomen and pelvis is overall mildly decreased from prior CT imaging. 4. Marked splenomegaly, mildly increased. 5. Mild wall thickening in the nondilated terminal ileum and cecum in the right lower quadrant, nonspecific, cannot exclude third-spacing, ischemia or infectious enterocolitis. No evidence of bowel obstruction. No pneumatosis. No pneumoperitoneum. 6. Moderate anasarca. 7. Cholelithiasis. 8. Chronic mild diffuse bladder wall  thickening, nonspecific. 9.  Aortic Atherosclerosis (ICD10-I70.0). Electronically Signed   By: Ilona Sorrel M.D.   On: 11/07/2022 16:31   DG Chest Port 1 View  Result Date: 11/07/2022 CLINICAL DATA:  Dyspnea,  vomiting, rectal bleeding, COPD, lymphoma EXAM: PORTABLE CHEST 1 VIEW COMPARISON:  10/27/2022 chest radiograph. FINDINGS: Stable right internal jugular Port-A-Cath terminating over the cavoatrial junction. Stable cardiomediastinal silhouette with mild cardiomegaly. No pneumothorax. Similar small bilateral pleural effusions. Moderate diffuse prominence of the parahilar interstitial markings with patchy bilateral parahilar and bibasilar lung opacities, worsened. IMPRESSION: 1. Mild cardiomegaly. Moderate diffuse prominence of the parahilar interstitial markings with patchy bilateral parahilar and bibasilar lung opacities, worsened, favor congestive heart failure with pulmonary edema. A component of aspiration or pneumonia at the lung bases is not excluded. 2. Similar small bilateral pleural effusions. Electronically Signed   By: Ilona Sorrel M.D.   On: 11/07/2022 14:06   US THORACENTESIS ASP PLEURAL SPACE W/IMG GUIDE  Result Date: 10/28/2022 INDICATION: Right pleural effusion EXAM: ULTRASOUND GUIDED RIGHT THORACENTESIS MEDICATIONS: 10 cc 1% lidocaine COMPLICATIONS: None immediate. PROCEDURE: An ultrasound guided thoracentesis was thoroughly discussed with the patient and questions answered. The benefits, risks, alternatives and complications were also discussed. The patient understands and wishes to proceed with the procedure. Written consent was obtained. Ultrasound was performed to localize and mark an adequate pocket of fluid in the right chest. The area was then prepped and draped in the normal sterile fashion. 1% Lidocaine was used for local anesthesia. Under ultrasound guidance a 6 Fr Safe-T-Centesis catheter was introduced. Thoracentesis was performed. The catheter was removed and a dressing applied. FINDINGS: A total of approximately 1.0 L of amber fluid was removed. Ordering provider did not request laboratory samples IMPRESSION: Successful ultrasound guided right thoracentesis yielding 1.0 L of pleural  fluid. Follow-up chest x-ray revealed no evidence of pneumothorax. Read by: Reatha Armour, PA-C Electronically Signed   By: Albin Felling M.D.   On: 10/28/2022 08:05   US Venous Img Lower Bilateral (DVT)  Result Date: 10/27/2022 CLINICAL DATA:  Bilateral leg pain. EXAM: BILATERAL LOWER EXTREMITY VENOUS DOPPLER ULTRASOUND TECHNIQUE: Gray-scale sonography with graded compression, as well as color Doppler and duplex ultrasound were performed to evaluate the lower extremity deep venous systems from the level of the common femoral vein and including the common femoral, femoral, profunda femoral, popliteal and calf veins including the posterior tibial, peroneal and gastrocnemius veins when visible. The superficial great saphenous vein was also interrogated. Spectral Doppler was utilized to evaluate flow at rest and with distal augmentation maneuvers in the common femoral, femoral and popliteal veins. COMPARISON:  CT AP, 09/19/2022. FINDINGS: RIGHT LOWER EXTREMITY VENOUS Normal compressibility of the RIGHT common femoral, superficial femoral, and popliteal veins, as well as the visualized calf veins. Visualized portions of profunda femoral vein and great saphenous vein unremarkable. No filling defects to suggest DVT on grayscale or color Doppler imaging. Doppler waveforms show normal direction of venous flow, normal respiratory plasticity and response to augmentation. OTHER No evidence of superficial thrombophlebitis or abnormal fluid collection. Pathologically-enlarged RIGHT inguinal lymph nodes are imaged. Limitations: none LEFT LOWER EXTREMITY VENOUS Normal compressibility of the LEFT common femoral, superficial femoral, and popliteal veins, as well as the visualized calf veins. Visualized portions of profunda femoral vein and great saphenous vein unremarkable. No filling defects to suggest DVT on grayscale or color Doppler imaging. Doppler waveforms show normal direction of venous flow, normal respiratory  plasticity and response to augmentation. OTHER No evidence of superficial thrombophlebitis or  abnormal fluid collection. Pathologically-enlarged LEFT inguinal lymph nodes are imaged. Limitations: none IMPRESSION: 1. No evidence of femoropopliteal DVT or superficial thrombophlebitis within either lower extremity. 2. Inguinal lymphadenopathy, best appreciated on most recent comparison CT and consistent with history of lymphoma. Michaelle Birks, MD [facility] Radiology Electronically Signed   By: Michaelle Birks M.D.   On: 10/27/2022 17:29   DG Chest Port 1 View  Result Date: 10/27/2022 CLINICAL DATA:  Post thoracentesis EXAM: PORTABLE CHEST 1 VIEW COMPARISON:  10/25/2022, CT 07/12/2022, chest x-ray 10/20/2022 FINDINGS: Right-sided central venous port tip over the SVC. Cardiomegaly with vascular congestion and probable edema. Moderate bilateral pleural effusions, increased on the left, decreased on the right. No right pneumothorax. Basilar airspace disease. IMPRESSION: 1. Moderate bilateral pleural effusions, increased on the left and decreased on the right. No pneumothorax. 2. Cardiomegaly with vascular congestion and probable edema. Basilar airspace disease persists. Electronically Signed   By: Donavan Foil M.D.   On: 10/27/2022 16:28   [place] CHEST (PLEURAL EFFUSION)  Result Date: 10/26/2022 CLINICAL DATA:  77 year old female with pleural effusion presenting for thoracentesis. EXAM: CHEST ULTRASOUND COMPARISON:  10/25/2022 FINDINGS: Large, simple appearing right pleural effusion. IMPRESSION: Large right pleural effusion. No thoracentesis was performed at this time due to significant hypotension. The patient may return for thoracentesis once resolved. Ruthann Cancer, MD [facility]iology Electronically Signed   By: Ruthann Cancer M.D.   On: 10/26/2022 10:13   DG Chest Port 1 View  Result Date: 10/25/2022 CLINICAL DATA:   Pleural effusion.  Productive cough. EXAM: PORTABLE CHEST 1 VIEW COMPARISON:  October 20, 2022 FINDINGS: Cardiomediastinal silhouette is stable. Stable right Port-A-Cath. No pneumothorax. Small left effusion with underlying atelectasis remains. A right pleural effusion with underlying opacity has increased. Diffuse bilateral pulmonary opacities are identified, worsened in the interval. No other interval changes. IMPRESSION: 1. Worsening right effusion with underlying opacity. Stable left effusion. 2. Diffuse bilateral pulmonary opacities may represent worsening pulmonary edema or a diffuse infectious process. Recommend clinical correlation. Electronically Signed   By: Dorise Bullion III M.D.   On: 10/25/2022 11:07   DG Chest Port 1 View  Result Date: 10/20/2022 CLINICAL DATA:  77 year old female with possible sepsis. EXAM: PORTABLE CHEST 1 VIEW COMPARISON:  Portable chest 09/20/2022 and earlier. FINDINGS: Portable AP semi upright view at 1019 hours. Ongoing moderate bilateral pleural effusions, not significantly changed. Right chest power port redemonstrated. Stable visible mediastinal contours. Partially calcified right lung nodule appears stable from a a chest CT 04/24/2022 (please see that report). Pulmonary vascularity appears stable since November. No pneumothorax. Paucity of bowel gas in the visible abdomen. Stable visualized osseous structures. IMPRESSION: 1. Ongoing moderate bilateral pleural effusions and pulmonary vascular congestion or mild edema, not significantly changed since November. 2. No new cardiopulmonary abnormality. Electronically Signed   By: Genevie Ann M.D.   On: 10/20/2022 10:31    Microbiology: Results for orders placed or performed during the hospital encounter of 11/07/22  Resp panel by RT-PCR (RSV, Flu A&B, Covid)     Status: Abnormal   Collection Time: 11/07/22 12:53 PM   Specimen: Nasal Swab  Result Value Ref Range Status   SARS Coronavirus 2 by RT PCR NEGATIVE NEGATIVE  Final    Comment: (NOTE) SARS-CoV-2 target nucleic acids are NOT DETECTED.  The SARS-CoV-2 RNA is generally detectable in upper respiratory specimens during the acute phase of infection. The lowest concentration of SARS-CoV-2 viral copies this assay can  detect is 138 copies/mL. A negative result does not preclude SARS-Cov-2 infection and should not be used as the sole basis for treatment or other patient management decisions. A negative result may occur with  improper specimen collection/handling, submission of specimen other than nasopharyngeal swab, presence of viral mutation(s) within the areas targeted by this assay, and inadequate number of viral copies(<138 copies/mL). A negative result must be combined with clinical observations, patient history, and epidemiological information. The expected result is Negative.  Fact Sheet for Patients:  EntrepreneurPulse.com.au  Fact Sheet for Healthcare Providers:  IncredibleEmployment.be  This test is no t yet approved or cleared by the [place] FDA and  has been authorized for detection and/or diagnosis of SARS-CoV-2 by FDA under an Emergency Use Authorization (EUA). This EUA will remain  in effect (meaning this test can be used) for the duration of the COVID-19 declaration under Section 564(b)(1) of the Act, 21 U.S.C.section 360bbb-3(b)(1), unless the authorization is terminated  or revoked sooner.       Influenza A by PCR POSITIVE (A) NEGATIVE Final   Influenza B by PCR NEGATIVE NEGATIVE Final    Comment: (NOTE) The Xpert Xpress SARS-CoV-2/FLU/RSV plus assay is intended as an aid in the diagnosis of influenza from Nasopharyngeal swab specimens and should not be used as a sole basis for treatment. Nasal washings and aspirates are unacceptable for Xpert Xpress SARS-CoV-2/FLU/RSV testing.  Fact Sheet for Patients: EntrepreneurPulse.com.au  Fact Sheet for Healthcare  Providers: IncredibleEmployment.be  This test is not yet approved or cleared by the [place] FDA and has been authorized for detection and/or diagnosis of SARS-CoV-2 by FDA under an Emergency Use Authorization (EUA). This EUA will remain in effect (meaning this test can be used) for the duration of the COVID-19 declaration under Section 564(b)(1) of the Act, 21 U.S.C. section 360bbb-3(b)(1), unless the authorization is terminated or revoked.     Resp Syncytial Virus by PCR NEGATIVE NEGATIVE Final    Comment: (NOTE) Fact Sheet for Patients: EntrepreneurPulse.com.au  Fact Sheet for Healthcare Providers: IncredibleEmployment.be  This test is not yet approved or cleared by the [place] FDA and has been authorized for detection and/or diagnosis of SARS-CoV-2 by FDA under an Emergency Use Authorization (EUA). This EUA will remain in effect (meaning this test can be used) for the duration of the COVID-19 declaration under Section 564(b)(1) of the Act, 21 U.S.C. section 360bbb-3(b)(1), unless the authorization is terminated or revoked.  Performed at [facility], [address] [place], [place] [address]   Respiratory (~20 pathogens) panel by PCR     Status: Abnormal   Collection Time: 11/07/22 12:53 PM   Specimen: Nasopharyngeal Swab; Respiratory  Result Value Ref Range Status   Adenovirus NOT DETECTED NOT DETECTED Final   Coronavirus 229E NOT DETECTED NOT DETECTED Final    Comment: (NOTE) The Coronavirus on the Respiratory Panel, DOES NOT test for the novel  Coronavirus (2019 nCoV)    Coronavirus HKU1 NOT DETECTED NOT DETECTED Final   Coronavirus NL63 NOT DETECTED NOT DETECTED Final   Coronavirus OC43 NOT DETECTED NOT DETECTED Final   Metapneumovirus NOT DETECTED NOT DETECTED Final   Rhinovirus / Enterovirus NOT DETECTED NOT DETECTED Final   Influenza A H1 2009 DETECTED (A) NOT DETECTED Final    Influenza B NOT DETECTED NOT DETECTED Final   Parainfluenza Virus 1 NOT DETECTED NOT DETECTED Final   Parainfluenza Virus 2 NOT DETECTED NOT DETECTED Final   Parainfluenza Virus 3 NOT DETECTED NOT DETECTED Final  Parainfluenza Virus 4 NOT DETECTED NOT DETECTED Final   Respiratory Syncytial Virus NOT DETECTED NOT DETECTED Final   Bordetella pertussis NOT DETECTED NOT DETECTED Final   Bordetella Parapertussis NOT DETECTED NOT DETECTED Final   Chlamydophila pneumoniae NOT DETECTED NOT DETECTED Final   Mycoplasma pneumoniae NOT DETECTED NOT DETECTED Final    Comment: Performed at [facility], [place] [address] [place], [place] [address]  MRSA Next Gen by PCR, Nasal     Status: None   Collection Time: 11/07/22  5:17 PM   Specimen: Nasal Mucosa; Nasal Swab  Result Value Ref Range Status   MRSA by PCR Next Gen NOT DETECTED NOT DETECTED Final    Comment: (NOTE) The GeneXpert MRSA Assay (FDA approved for NASAL specimens only), is one component of a comprehensive MRSA colonization surveillance program. It is not intended to diagnose MRSA infection nor to guide or monitor treatment for MRSA infections. Test performance is not FDA approved in patients less than 52 years old. Performed at [facility], [address] [place], [place] [address]   Gastrointestinal Panel by PCR , Stool     Status: None   Collection Time: 11/09/22  5:30 PM   Specimen: Stool  Result Value Ref Range Status   Campylobacter species NOT DETECTED NOT DETECTED Final   Plesimonas shigelloides NOT DETECTED NOT DETECTED Final   Salmonella species NOT DETECTED NOT DETECTED Final   Yersinia enterocolitica NOT DETECTED NOT DETECTED Final   Vibrio species NOT DETECTED NOT DETECTED Final   Vibrio cholerae NOT DETECTED NOT DETECTED Final   Enteroaggregative E coli (EAEC) NOT DETECTED NOT DETECTED Final   Enteropathogenic E coli (EPEC) NOT DETECTED NOT DETECTED Final   Enterotoxigenic E coli (ETEC) NOT DETECTED  NOT DETECTED Final   Shiga like toxin producing E coli (STEC) NOT DETECTED NOT DETECTED Final   Shigella/Enteroinvasive E coli (EIEC) NOT DETECTED NOT DETECTED Final   Cryptosporidium NOT DETECTED NOT DETECTED Final   Cyclospora cayetanensis NOT DETECTED NOT DETECTED Final   Entamoeba histolytica NOT DETECTED NOT DETECTED Final   Giardia lamblia NOT DETECTED NOT DETECTED Final   Adenovirus F40/41 NOT DETECTED NOT DETECTED Final   Astrovirus NOT DETECTED NOT DETECTED Final   Norovirus GI/GII NOT DETECTED NOT DETECTED Final   Rotavirus A NOT DETECTED NOT DETECTED Final   Sapovirus (I, II, IV, and V) NOT DETECTED NOT DETECTED Final    Comment: Performed at [facility], [address] [place], [place] [address]  C Difficile Quick Screen w PCR reflex     Status: None   Collection Time: 11/09/22  5:30 PM   Specimen: STOOL  Result Value Ref Range Status   C Diff antigen NEGATIVE NEGATIVE Final   C Diff toxin NEGATIVE NEGATIVE Final   C Diff interpretation No C. difficile detected.  Final    Comment: Performed at [facility], [address] [place], [place] [address]    Labs: CBC: Recent Labs  Lab 11/08/22 0425 11/08/22 0847 11/09/22 0420 11/09/22 1452 11/09/22 2239 11/10/22 0522 11/10/22 1448 11/11/22 0500 11/11/22 1021 11/11/22 1145 11/11/22 1840 11/12/22 0023 11/12/22 0520  WBC 8.2  --  5.8  --   --  4.1  --  3.8*  --   --   --   --  3.6*  NEUTROABS 2.1  --  1.6*  --   --  1.1*  --   --   --   --   --   --   --  HGB 6.8*  6.7*   < > 7.2*   < > 7.3* 7.2*   < > 7.9* 8.4* 7.6* 8.8* 7.6* 8.1*  HCT 21.1*  20.7*   < > 22.7*   < > 22.7* 23.5*   < > 25.5* 27.5* 24.9* 28.2* 24.9* 26.2*  MCV 88.7  --  89.7  --   --  94.0  --  90.1  --   --   --   --  90.7  PLT 64*  --  58*  --  49* 59*  --  71*  --   --   --   --  77*   < > = values in this interval not displayed.   Basic Metabolic Panel: Recent Labs  Lab 11/08/22 0425 11/09/22 0420 11/10/22 0522  11/11/22 0500 11/12/22 0735  NA 140 141 140 143 138  K 4.6 4.1 4.0 4.2 4.2  CL 104 107 106 105 104  CO2 '29 29 30 31 30  '$ GLUCOSE 126* 109* 115* 93 109*  BUN 57* 47* 36* 29* 25*  CREATININE 1.07* 1.10* 0.98 0.97 0.88  CALCIUM 8.0* 8.3* 8.3* 8.1* 7.8*  MG 1.5* 1.9 1.8 1.7  --   PHOS 4.8* 4.9* 4.4 3.7  --    Liver Function Tests: Recent Labs  Lab 11/07/22 1253  AST 20  ALT 11  ALKPHOS 123  BILITOT 1.2  PROT 3.7*  ALBUMIN 2.1*   CBG: Recent Labs  Lab 11/11/22 2045 11/12/22 0024 11/12/22 0414 11/12/22 0817 11/12/22 1121  GLUCAP 157* 134* 80 120* 225*    Discharge time spent: greater than 30 minutes.  Signed: Loletha Grayer, MD [facility] 11/12/2022

## 2022-11-12 NOTE — Discharge Instructions (Signed)
Take protonix daily in morning please also buy over the counter omeprazole 20 mg po and take at night for thirty days only

## 2022-11-12 NOTE — Progress Notes (Signed)
Mobility Specialist - Progress Note   11/12/22 1406  Mobility  Activity Ambulated independently in hallway  Level of Assistance Modified independent, requires aide device or extra time  Assistive Device Front wheel walker  Distance Ambulated (ft) 180 ft  Activity Response Tolerated well  Mobility Referral Yes  $Mobility charge 1 Mobility   Pt sitting EOB upon entry, utilizing 3L [employer]. Pt STS to RW ModI, min weight-bearing through BUE. Pt amb one lap around NS ModI with O2 >90% throughout session. Pt returned to room, left sitting in recliner with needs within reach.   [place]ie [place] Mobility Specialist 11/12/22 2:10 PM

## 2022-11-12 NOTE — Progress Notes (Signed)
[place] at [facility] [phone] [phone]   Name: Natasha [name] Date: 11/12/2022 MRN: [id number]  DOB: 06/25/1946  Patient Care Team: [name], NP as PCP - General (Nurse Practitioner) [name], RN (Inactive) as Case Manager [name], RN as [place] [facility]    REASON FOR CONSULTATION: Natasha [name] is a 77 y.o. 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  Patient has had multiple recent hospitalizations and was most recently admitted on 11/07/2022 with GI bleed.  Palliative care was consulted to address goals.  SOCIAL HISTORY:     reports that she quit smoking about 3 months ago. Her smoking use included cigarettes. She started smoking about 61 years ago. She has a 110.00 pack-year smoking history. She has never used smokeless tobacco. She reports that she does not drink alcohol and does not use drugs.  ADVANCE DIRECTIVES:  On file  CODE STATUS: Full code  PAST MEDICAL HISTORY: Past Medical History:  Diagnosis Date   Anemia    Cancer (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; CT scan abdomen pelvis  suggestive of up to 2 cm retroperitoneal adenopathy/ periportal/peripancreatic/pelvic adenopathy; 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  09/08/2018 Initial Diagnosis   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[place]   Nodal marginal zone B-cell lymphoma ([place]  07/28/2022 Initial Diagnosis   Marginal zone lymphoma ([place]   08/01/2022 Cancer Staging   Staging form: Hodgkin and Non-Hodgkin Lymphoma, AJCC 8th Edition - Clinical stage from 08/01/2022: Stage IV (Marginal zone lymphoma) - Signed by RSindy Guadeloupe MD on 08/01/2022   08/17/2022 - 08/17/2022 Chemotherapy   Patient is on Treatment Plan : NON-HODGKINS LYMPHOMA Rituximab D1 + Bendamustine D1,2 q28d x 6 cycles     11/17/2022 -  Chemotherapy   Patient is on Treatment Plan : NON-HODGKINS LYMPHOMA Rituximab q7d       ALLERGIES:  has No Known Allergies.  MEDICATIONS:  Current Facility-Administered Medications  Medication Dose Route Frequency Provider Last Rate Last Admin   0.9 %  sodium chloride infusion (Manually program via Guardrails IV Fluids)   Intravenous Once [place], BToribio HarbourL, NP       0.9 %  sodium chloride infusion   Intravenous PRN AOttie Glazier MD   Stopped at 11/09/22 1747   0.9 %  sodium chloride infusion  250 mL Intravenous Continuous GDallie Piles RPH       acyclovir (ZOVIRAX) 200 MG capsule 400 mg  400 mg Oral Daily GDallie Piles RPH   400 mg at 11/12/22 [id number]  amoxicillin-clavulanate (AUGMENTIN) 875-125 MG per tablet 1 tablet  1 tablet Oral Q12H Ottie Glazier, MD   1 tablet at 11/12/22 4536   Chlorhexidine Gluconate Cloth 2 % PADS 6 each  6 each Topical Daily Ottie Glazier, MD   6 each at 11/12/22 0939   docusate sodium (COLACE) capsule 100 mg  100 mg Oral BID PRN Ottie Glazier, MD       guaiFENesin (ROBITUSSIN) 100 MG/5ML liquid 5 mL  5 mL Oral Q4H PRN Teressa Lower, NP   5 mL at 11/09/22 2219    insulin aspart (novoLOG) injection 0-15 Units  0-15 Units Subcutaneous Q4H Teressa Lower, NP   5 Units at 11/12/22 1232   levothyroxine (SYNTHROID) tablet 50 mcg  50 mcg Oral Q0600 Dallie Piles, RPH   50 mcg at 11/12/22 0549   midodrine (PROAMATINE) tablet 10 mg  10 mg Oral TID WC Loletha Grayer, MD   10 mg at 11/12/22 1232   morphine (PF) 2 MG/ML injection 1 mg  1 mg Intravenous Q3H PRN Ottie Glazier, MD   1 mg at 11/09/22 2219   Oral care mouth rinse  15 mL Mouth Rinse PRN Ottie Glazier, MD       oseltamivir (TAMIFLU) capsule 75 mg  75 mg Oral Once Ottie Glazier, MD       pantoprazole (PROTONIX) injection 40 mg  40 mg Intravenous Q12H Ottie Glazier, MD   40 mg at 11/12/22 0939   polyethylene glycol (MIRALAX / GLYCOLAX) packet 17 g  17 g Oral Daily PRN Ottie Glazier, MD       sodium chloride flush (NS) 0.9 % injection 10-40 mL  10-40 mL Intracatheter PRN Ottie Glazier, MD   10 mL at 11/12/22 1532   thiamine (VITAMIN B1) injection 100 mg  100 mg Intravenous Daily Ottie Glazier, MD   100 mg at 11/12/22 0938    VITAL SIGNS: BP (!) 104/56 (BP Location: Left Arm)   Pulse 79   Temp 98.5 F (36.9 C) (Oral)   Resp 18   Ht '5\' 4"'$  (1.626 m)   Wt 160 lb 11.5 oz (72.9 kg)   SpO2 100%   BMI 27.59 kg/m  Filed Weights   11/09/22 0415 11/10/22 0446 11/11/22 0447  Weight: 153 lb (69.4 kg) 155 lb 3.3 oz (70.4 kg) 160 lb 11.5 oz (72.9 kg)    Estimated body mass index is 27.59 kg/m as calculated from the following:   Height as of this encounter: '5\' 4"'$  (1.626 m).   Weight as of this encounter: 160 lb 11.5 oz (72.9 kg).  LABS: CBC:    Component Value Date/Time   WBC 3.6 (L) 11/12/2022 0520   HGB 8.1 (L) 11/12/2022 0520   HGB 8.0 (L) 06/22/2022 0939   HCT 26.2 (L) 11/12/2022 0520   HCT 26.6 (L) 06/22/2022 0939   PLT 77 (L) 11/12/2022 0520   PLT 77 (LL) 06/22/2022 0939   MCV 90.7 11/12/2022 0520   MCV 93 06/22/2022 0939   NEUTROABS 1.1 (L) 11/10/2022 0522   NEUTROABS 0.4  (LL) 06/22/2022 0939   LYMPHSABS 2.4 11/10/2022 0522   LYMPHSABS 3.1 06/22/2022 0939   MONOABS 0.4 11/10/2022 0522   EOSABS 0.1 11/10/2022 0522   EOSABS 0.1 06/22/2022 0939   BASOSABS 0.0 11/10/2022 0522   BASOSABS 0.0 06/22/2022 0939   Comprehensive Metabolic Panel:    Component Value Date/Time   NA 138 11/12/2022 0735   NA 143 06/22/2022 0939   K 4.2 11/12/2022 0735  CL 104 11/12/2022 0735   CO2 30 11/12/2022 0735   BUN 25 (H) 11/12/2022 0735   BUN 21 06/22/2022 0939   CREATININE 0.88 11/12/2022 0735   CREATININE 0.92 07/07/2018 0955   GLUCOSE 109 (H) 11/12/2022 0735   CALCIUM 7.8 (L) 11/12/2022 0735   AST 20 11/07/2022 1253   ALT 11 11/07/2022 1253   ALKPHOS 123 11/07/2022 1253   BILITOT 1.2 11/07/2022 1253   BILITOT 2.0 (H) 06/22/2022 0939   PROT 3.7 (L) 11/07/2022 1253   PROT 4.9 (L) 06/22/2022 0939   ALBUMIN 2.1 (L) 11/07/2022 1253   ALBUMIN 3.4 (L) 06/22/2022 0939    RADIOGRAPHIC STUDIES: US THORACENTESIS ASP PLEURAL SPACE W/IMG GUIDE  Result Date: 11/11/2022 INDICATION: Pleural effusion EXAM: ULTRASOUND GUIDED RIGHT THORACENTESIS MEDICATIONS: 8 cc 1% lidocaine COMPLICATIONS: None immediate. PROCEDURE: An ultrasound guided thoracentesis was thoroughly discussed with the patient and questions answered. The benefits, risks, alternatives and complications were also discussed. The patient understands and wishes to proceed with the procedure. Written consent was obtained. Ultrasound was performed to localize and mark an adequate pocket of fluid in the right chest. The area was then prepped and draped in the normal sterile fashion. 1% Lidocaine was used for local anesthesia. Under ultrasound guidance a 6 Fr Safe-T-Centesis catheter was introduced. Thoracentesis was performed. The catheter was removed and a dressing applied. FINDINGS: A total of approximately 1.2 L of hazy amber fluid was removed. Samples were sent to the laboratory as requested by the clinical team. IMPRESSION:  Successful ultrasound guided right thoracentesis yielding 1.2 L of pleural fluid. Follow-up chest x-ray revealed no evidence of pneumothorax. Read by: Reatha Armour, PA-C Electronically Signed   By: Jerilynn Mages.  Shick M.D.   On: 11/11/2022 07:54   DG Chest Port 1 View  Result Date: 11/10/2022 CLINICAL DATA:  Status post right thoracentesis, pleural effusion. EXAM: PORTABLE CHEST 1 VIEW COMPARISON:  Chest CT 11/07/2022 and chest radiograph from the same date FINDINGS: Power injectable right Port-A-Cath tip: SVC. Moderate bilateral pleural effusions are observed. No visible pneumothorax. Some improvement in aeration in the right mid lung likely reflects reduced pleural fluid related to the thoracentesis. Upper normal heart size. Indistinct pulmonary vasculature, cannot exclude pulmonary venous hypertension and mild edema. Rounded density in the right mid lung compatible with pulmonary nodule (previously shown to be partially calcified). IMPRESSION: 1. No pneumothorax status post right thoracentesis. 2. Moderate bilateral pleural effusions. 3. Indistinct pulmonary vasculature, cannot exclude pulmonary venous hypertension and mild edema. 4. Rounded density in the right mid lung compatible with a pulmonary nodule (previously shown to be partially calcified). 5. Upper normal heart size. Electronically Signed   By: Van Clines M.D.   On: 11/10/2022 16:30   CT CHEST ABDOMEN PELVIS W CONTRAST  Result Date: 11/07/2022 CLINICAL DATA:  Sepsis. Dementia. Lymphoma. Hemorrhagic shock requiring blood transfusions and pressor support. * Tracking Code: BO * EXAM: CT CHEST, ABDOMEN, AND PELVIS WITH CONTRAST TECHNIQUE: Multidetector CT imaging of the chest, abdomen and pelvis was performed following the standard protocol during bolus administration of intravenous contrast. RADIATION DOSE REDUCTION: This exam was performed according to the departmental dose-optimization program which includes automated exposure control, adjustment  of the mA and/or kV according to patient size and/or use of iterative reconstruction technique. CONTRAST:  149m OMNIPAQUE IOHEXOL 300 MG/ML  SOLN COMPARISON:  Chest radiograph from earlier today. 09/19/2022 CT abdomen/pelvis. 07/12/2022 chest CT angiogram. FINDINGS: CT CHEST FINDINGS Cardiovascular: Normal heart size. No significant pericardial effusion/thickening. Left anterior descending coronary atherosclerosis.  Right internal jugular Port-A-Cath terminates at the cavoatrial junction. Atherosclerotic nonaneurysmal thoracic aorta. Normal caliber pulmonary arteries. No central pulmonary emboli. Mediastinum/Nodes: No significant thyroid nodules. Unremarkable esophagus. Widespread mild to moderate bilateral axillary, bilateral lower neck, bilateral mediastinal and bilateral hilar lymphadenopathy, overall mildly decreased. Representative 1.6 cm right axillary node (series 3/image 23), decreased from 2.0 cm. Representative 1.7 cm right paratracheal node (series 3/image 19), mildly decreased from 1.9 cm. Representative 1.6 cm AP window node (series 3/image 20), stable. Lungs/Pleura: No pneumothorax. Moderate to large right and small to moderate left dependent pleural effusions with mild smooth bilateral pleural thickening and enhancement. New patchy consolidation in the posterior right upper lobe. New prominent patchy tree-in-bud opacities throughout the bilateral upper lobes. Calcified 1.9 cm posterior right upper lobe nodule is stable. Moderate compressive atelectasis in the posterior lower lobes bilaterally. Musculoskeletal: No aggressive appearing focal osseous lesions. Mild thoracic spondylosis. Mild anasarca. CT ABDOMEN PELVIS FINDINGS Hepatobiliary: Normal liver with no liver mass. Gallstones within the contracted gallbladder with no definite gallbladder wall thickening. No biliary ductal dilatation. Pancreas: Normal, with no mass or duct dilation. Spleen: Marked splenomegaly measuring 21.5 cm craniocaudal  length, previously 19.7 cm, mildly increased. No splenic masses. Adrenals/Urinary Tract: Normal adrenals. Normal kidneys with no hydronephrosis and no renal mass. Well-positioned Foley catheter within the bladder. Expected minimal gas in the nondependent bladder from instrumentation. Chronic mild diffuse bladder wall thickening. Stomach/Bowel: Normal non-distended stomach. Normal caliber small bowel loops. Appendix not discretely visualized. Mild wall thickening in the nondilated terminal ileum and cecum in the right lower quadrant. No additional sites of small or large bowel wall thickening. No significant diverticulosis. Vascular/Lymphatic: Atherosclerotic nonaneurysmal abdominal aorta. Patent portal, splenic, hepatic and renal veins. Widespread moderate lymphadenopathy throughout the retroperitoneum and bilateral pelvis is overall slightly decreased from 09/19/2022 CT. Representative 2.3 cm portacaval node (series 3/image 63), decreased from 2.5 cm. Representative 1.7 cm left para-aortic node (series 3/image 69), decreased from 2.0 cm. Representative 1.8 cm right inguinal node (series 3/image 111), decreased from 2.0 cm. Reproductive: Grossly normal uterus.  No adnexal mass. Other: No pneumoperitoneum. No focal fluid collection. Moderate anasarca. Musculoskeletal: No aggressive appearing focal osseous lesions. Chronic L2 and L3 vertebral compression fractures. Mild-to-moderate lumbar degenerative disc disease. IMPRESSION: 1. Moderate to large right and small to moderate left dependent pleural effusions with mild smooth bilateral pleural thickening and enhancement. 2. New patchy consolidation in the posterior right upper lobe. New prominent patchy tree-in-bud opacities throughout the bilateral upper lobes. Findings are most compatible with multilobar bronchopneumonia. 3. Widespread lymphadenopathy in the chest, abdomen and pelvis is overall mildly decreased from prior CT imaging. 4. Marked splenomegaly, mildly  increased. 5. Mild wall thickening in the nondilated terminal ileum and cecum in the right lower quadrant, nonspecific, cannot exclude third-spacing, ischemia or infectious enterocolitis. No evidence of bowel obstruction. No pneumatosis. No pneumoperitoneum. 6. Moderate anasarca. 7. Cholelithiasis. 8. Chronic mild diffuse bladder wall thickening, nonspecific. 9.  Aortic Atherosclerosis (ICD10-I70.0). Electronically Signed   By: Ilona Sorrel M.D.   On: 11/07/2022 16:31   DG Chest Port 1 View  Result Date: 11/07/2022 CLINICAL DATA:  Dyspnea, vomiting, rectal bleeding, COPD, lymphoma EXAM: PORTABLE CHEST 1 VIEW COMPARISON:  10/27/2022 chest radiograph. FINDINGS: Stable right internal jugular Port-A-Cath terminating over the cavoatrial junction. Stable cardiomediastinal silhouette with mild cardiomegaly. No pneumothorax. Similar small bilateral pleural effusions. Moderate diffuse prominence of the parahilar interstitial markings with patchy bilateral parahilar and bibasilar lung opacities, worsened. IMPRESSION: 1. Mild cardiomegaly. Moderate diffuse prominence of  the parahilar interstitial markings with patchy bilateral parahilar and bibasilar lung opacities, worsened, favor congestive heart failure with pulmonary edema. A component of aspiration or pneumonia at the lung bases is not excluded. 2. Similar small bilateral pleural effusions. Electronically Signed   By: Ilona Sorrel M.D.   On: 11/07/2022 14:06   US THORACENTESIS ASP PLEURAL SPACE W/IMG GUIDE  Result Date: 10/28/2022 INDICATION: Right pleural effusion EXAM: ULTRASOUND GUIDED RIGHT THORACENTESIS MEDICATIONS: 10 cc 1% lidocaine COMPLICATIONS: None immediate. PROCEDURE: An ultrasound guided thoracentesis was thoroughly discussed with the patient and questions answered. The benefits, risks, alternatives and complications were also discussed. The patient understands and wishes to proceed with the procedure. Written consent was obtained. Ultrasound was  performed to localize and mark an adequate pocket of fluid in the right chest. The area was then prepped and draped in the normal sterile fashion. 1% Lidocaine was used for local anesthesia. Under ultrasound guidance a 6 Fr Safe-T-Centesis catheter was introduced. Thoracentesis was performed. The catheter was removed and a dressing applied. FINDINGS: A total of approximately 1.0 L of amber fluid was removed. Ordering provider did not request laboratory samples IMPRESSION: Successful ultrasound guided right thoracentesis yielding 1.0 L of pleural fluid. Follow-up chest x-ray revealed no evidence of pneumothorax. Read by: Reatha Armour, PA-C Electronically Signed   By: Albin Felling M.D.   On: 10/28/2022 08:05   US Venous Img Lower Bilateral (DVT)  Result Date: 10/27/2022 CLINICAL DATA:  Bilateral leg pain. EXAM: BILATERAL LOWER EXTREMITY VENOUS DOPPLER ULTRASOUND TECHNIQUE: Gray-scale sonography with graded compression, as well as color Doppler and duplex ultrasound were performed to evaluate the lower extremity deep venous systems from the level of the common femoral vein and including the common femoral, femoral, profunda femoral, popliteal and calf veins including the posterior tibial, peroneal and gastrocnemius veins when visible. The superficial great saphenous vein was also interrogated. Spectral Doppler was utilized to evaluate flow at rest and with distal augmentation maneuvers in the common femoral, femoral and popliteal veins. COMPARISON:  CT AP, 09/19/2022. FINDINGS: RIGHT LOWER EXTREMITY VENOUS Normal compressibility of the RIGHT common femoral, superficial femoral, and popliteal veins, as well as the visualized calf veins. Visualized portions of profunda femoral vein and great saphenous vein unremarkable. No filling defects to suggest DVT on grayscale or color Doppler imaging. Doppler waveforms show normal direction of venous flow, normal respiratory plasticity and response to augmentation. OTHER No  evidence of superficial thrombophlebitis or abnormal fluid collection. Pathologically-enlarged RIGHT inguinal lymph nodes are imaged. Limitations: none LEFT LOWER EXTREMITY VENOUS Normal compressibility of the LEFT common femoral, superficial femoral, and popliteal veins, as well as the visualized calf veins. Visualized portions of profunda femoral vein and great saphenous vein unremarkable. No filling defects to suggest DVT on grayscale or color Doppler imaging. Doppler waveforms show normal direction of venous flow, normal respiratory plasticity and response to augmentation. OTHER No evidence of superficial thrombophlebitis or abnormal fluid collection. Pathologically-enlarged LEFT inguinal lymph nodes are imaged. Limitations: none IMPRESSION: 1. No evidence of femoropopliteal DVT or superficial thrombophlebitis within either lower extremity. 2. Inguinal lymphadenopathy, best appreciated on most recent comparison CT and consistent with history of lymphoma. Michaelle Birks, MD [facility] Electronically Signed   By: Michaelle Birks M.D.   On: 10/27/2022 17:29   DG Chest Port 1 View  Result Date: 10/27/2022 CLINICAL DATA:  Post thoracentesis EXAM: PORTABLE CHEST 1 VIEW COMPARISON:  10/25/2022, CT 07/12/2022, chest x-ray 10/20/2022 FINDINGS: Right-sided central venous port tip  over the SVC. Cardiomegaly with vascular congestion and probable edema. Moderate bilateral pleural effusions, increased on the left, decreased on the right. No right pneumothorax. Basilar airspace disease. IMPRESSION: 1. Moderate bilateral pleural effusions, increased on the left and decreased on the right. No pneumothorax. 2. Cardiomegaly with vascular congestion and probable edema. Basilar airspace disease persists. Electronically Signed   By: Donavan Foil M.D.   On: 10/27/2022 16:28   [place] CHEST (PLEURAL EFFUSION)  Result Date: 10/26/2022 CLINICAL DATA:  77 year old female with pleural  effusion presenting for thoracentesis. EXAM: CHEST ULTRASOUND COMPARISON:  10/25/2022 FINDINGS: Large, simple appearing right pleural effusion. IMPRESSION: Large right pleural effusion. No thoracentesis was performed at this time due to significant hypotension. The patient may return for thoracentesis once resolved. Ruthann Cancer, MD [facility] Electronically Signed   By: Ruthann Cancer M.D.   On: 10/26/2022 10:13   DG Chest Port 1 View  Result Date: 10/25/2022 CLINICAL DATA:  Pleural effusion.  Productive cough. EXAM: PORTABLE CHEST 1 VIEW COMPARISON:  October 20, 2022 FINDINGS: Cardiomediastinal silhouette is stable. Stable right Port-A-Cath. No pneumothorax. Small left effusion with underlying atelectasis remains. A right pleural effusion with underlying opacity has increased. Diffuse bilateral pulmonary opacities are identified, worsened in the interval. No other interval changes. IMPRESSION: 1. Worsening right effusion with underlying opacity. Stable left effusion. 2. Diffuse bilateral pulmonary opacities may represent worsening pulmonary edema or a diffuse infectious process. Recommend clinical correlation. Electronically Signed   By: Dorise Bullion III M.D.   On: 10/25/2022 11:07   DG Chest Port 1 View  Result Date: 10/20/2022 CLINICAL DATA:  77 year old female with possible sepsis. EXAM: PORTABLE CHEST 1 VIEW COMPARISON:  Portable chest 09/20/2022 and earlier. FINDINGS: Portable AP semi upright view at 1019 hours. Ongoing moderate bilateral pleural effusions, not significantly changed. Right chest power port redemonstrated. Stable visible mediastinal contours. Partially calcified right lung nodule appears stable from a a chest CT 04/24/2022 (please see that report). Pulmonary vascularity appears stable since November. No pneumothorax. Paucity of bowel gas in the visible abdomen. Stable visualized osseous structures. IMPRESSION: 1. Ongoing  moderate bilateral pleural effusions and pulmonary vascular congestion or mild edema, not significantly changed since November. 2. No new cardiopulmonary abnormality. Electronically Signed   By: Genevie Ann M.D.   On: 10/20/2022 10:31    PERFORMANCE STATUS (ECOG) : 2 - Symptomatic, <50% confined to bed  Review of Systems Unless otherwise noted, a complete review of systems is negative.  Physical Exam General: NAD Pulmonary: Unlabored Extremities: no edema, no joint deformities Skin: no rashes Neurological: Weakness but otherwise nonfocal  IMPRESSION: Patient was seen yesterday by Vinie Sill, NP with the palliative care team.  I followed up with patient today.  Patient was being prepped by nursing to discharge home.  Patient states that she feels significantly improved and back to her baseline.  She denies any significant symptomatic complaints or concerns at present.  Today, patient was able to relate to me the nature of her most recent and current hospitalization.  She recognizes that she has lymphoma and we discussed her goals for ongoing treatment versus transitioning to supportive/hospice care.  Patient says that she would want to proceed with further treatments if any options are available.  Will plan for her to follow-up at the cancer center for further discussions regarding goals.  PLAN: -Follow-up in the cancer center   Time Total: 30 minutes  Visit consisted of counseling and education dealing with the complex  and emotionally intense issues of symptom management and palliative care in the setting of serious and potentially life-threatening illness.Greater than 50%  of this time was spent counseling and coordinating care related to the above assessment and plan.  Signed by: Altha Harm, PhD, NP-C

## 2022-11-13 ENCOUNTER — Encounter: Payer: Self-pay | Admitting: Oncology

## 2022-11-13 LAB — TYPE AND SCREEN
ABO/RH(D): O POS
Antibody Screen: NEGATIVE

## 2022-11-14 DIAGNOSIS — C884 Extranodal marginal zone B-cell lymphoma of mucosa-associated lymphoid tissue [MALT-lymphoma]: Secondary | ICD-10-CM | POA: Diagnosis not present

## 2022-11-14 DIAGNOSIS — E039 Hypothyroidism, unspecified: Secondary | ICD-10-CM | POA: Diagnosis not present

## 2022-11-14 DIAGNOSIS — Z9181 History of falling: Secondary | ICD-10-CM | POA: Diagnosis not present

## 2022-11-14 DIAGNOSIS — Z6823 Body mass index (BMI) 23.0-23.9, adult: Secondary | ICD-10-CM | POA: Diagnosis not present

## 2022-11-14 DIAGNOSIS — D696 Thrombocytopenia, unspecified: Secondary | ICD-10-CM | POA: Diagnosis not present

## 2022-11-14 DIAGNOSIS — A419 Sepsis, unspecified organism: Secondary | ICD-10-CM | POA: Diagnosis not present

## 2022-11-14 DIAGNOSIS — I959 Hypotension, unspecified: Secondary | ICD-10-CM | POA: Diagnosis not present

## 2022-11-14 DIAGNOSIS — I5033 Acute on chronic diastolic (congestive) heart failure: Secondary | ICD-10-CM | POA: Diagnosis not present

## 2022-11-14 DIAGNOSIS — J441 Chronic obstructive pulmonary disease with (acute) exacerbation: Secondary | ICD-10-CM | POA: Diagnosis not present

## 2022-11-14 DIAGNOSIS — Z794 Long term (current) use of insulin: Secondary | ICD-10-CM | POA: Diagnosis not present

## 2022-11-14 DIAGNOSIS — E119 Type 2 diabetes mellitus without complications: Secondary | ICD-10-CM | POA: Diagnosis not present

## 2022-11-14 DIAGNOSIS — Z87891 Personal history of nicotine dependence: Secondary | ICD-10-CM | POA: Diagnosis not present

## 2022-11-14 DIAGNOSIS — J9622 Acute and chronic respiratory failure with hypercapnia: Secondary | ICD-10-CM | POA: Diagnosis not present

## 2022-11-14 DIAGNOSIS — D61818 Other pancytopenia: Secondary | ICD-10-CM | POA: Diagnosis not present

## 2022-11-14 DIAGNOSIS — D509 Iron deficiency anemia, unspecified: Secondary | ICD-10-CM | POA: Diagnosis not present

## 2022-11-14 DIAGNOSIS — J90 Pleural effusion, not elsewhere classified: Secondary | ICD-10-CM | POA: Diagnosis not present

## 2022-11-14 DIAGNOSIS — J9621 Acute and chronic respiratory failure with hypoxia: Secondary | ICD-10-CM | POA: Diagnosis not present

## 2022-11-14 LAB — BODY FLUID CULTURE W GRAM STAIN
Culture: NO GROWTH
Gram Stain: NONE SEEN

## 2022-11-15 ENCOUNTER — Telehealth: Payer: Self-pay | Admitting: Nurse Practitioner

## 2022-11-15 ENCOUNTER — Telehealth: Payer: Self-pay

## 2022-11-15 ENCOUNTER — Other Ambulatory Visit (HOSPITAL_COMMUNITY): Payer: Self-pay

## 2022-11-15 ENCOUNTER — Ambulatory Visit: Payer: Medicaid Other

## 2022-11-15 ENCOUNTER — Telehealth: Payer: Self-pay | Admitting: Pharmacist

## 2022-11-15 DIAGNOSIS — N1831 Chronic kidney disease, stage 3a: Secondary | ICD-10-CM | POA: Diagnosis not present

## 2022-11-15 DIAGNOSIS — I5033 Acute on chronic diastolic (congestive) heart failure: Secondary | ICD-10-CM | POA: Diagnosis not present

## 2022-11-15 DIAGNOSIS — C8580 Other specified types of non-Hodgkin lymphoma, unspecified site: Secondary | ICD-10-CM

## 2022-11-15 NOTE — Telephone Encounter (Signed)
Oral Oncology Patient Advocate Encounter  Prior Authorization for Lenalidomide has been approved.    PA# [id number]  Effective dates: 11/15/22 through 10/25/23  Patients co-pay is $0.00.    Berdine Addison, [facility] [place]  [phone] (phone) [phone] (fax) 11/15/2022 3:08 PM

## 2022-11-15 NOTE — Telephone Encounter (Signed)
Oral Oncology Pharmacy Student Encounter  Received new prescription for Revlimid (lenalidomide) for the treatment of stage 4 nodal marginal zone lymphoma in conjunction with Rituxan (rituximab) planned duration for up to 12 cycles.  BMP from 11/12/2022 assessed, creatinine 0.88 and calculated CrCl is 54.9 mL/min, according to Revlimid PI, CrCl 30-59 mL/min in combination therapy for marginal zone lymphoma, dose should be '10mg'$  once daily. After 2 cycles, dose may be increased to 15 mg if patient has tolerated therapy. Prescription dose and frequency assessed.   Current medication list in Epic reviewed, No DDIs with current medications identified.  Evaluated chart and a few patient barriers to medication adherence identified. In the past, patient has no-showed to the clinic and has been in and out of hospital. Pharmacist plans to speak to patient and caregiver (daughter) for medication education.  Oral Oncology Clinic will continue to follow for initial counseling and start date.   Scarlette Calico, PharmD Candidate [place]/DB/AP [facility] [phone]  11/15/2022 1:11 PM

## 2022-11-15 NOTE — Telephone Encounter (Signed)
Attempted to call patient but she did not answer and VM is full. Called and spoke with patient's daughter, she knows that her mother is to no longer take the Calqeunce (acalabrutinib) and that her therapy is being changed.  Patient was inactivated the [employer] (Specialty) for her Calquence.

## 2022-11-15 NOTE — Telephone Encounter (Signed)
Nurse Hermenia Fiscal was calling to let Dr. Toy Care knows that pt was hospitalized in a long term facility because of her COPD, flu, chronic kidney disease, etc and she will be monitored by a nurse that will come over a few times a day. If any question she's is available '@[phone].

## 2022-11-15 NOTE — Telephone Encounter (Signed)
Oral Oncology Patient Advocate Encounter   Received notification that prior authorization for Lenalidomide is required.   PA submitted on 11/15/22  Key [id number]  Status is pending     Berdine Addison, [place] Patient [place]  [phone] (phone) [phone] (fax) 11/15/2022 2:09 PM

## 2022-11-16 ENCOUNTER — Other Ambulatory Visit: Payer: Self-pay | Admitting: *Deleted

## 2022-11-16 ENCOUNTER — Telehealth: Payer: Self-pay

## 2022-11-16 ENCOUNTER — Other Ambulatory Visit (HOSPITAL_COMMUNITY): Payer: Self-pay

## 2022-11-16 ENCOUNTER — Encounter: Payer: Self-pay | Admitting: Oncology

## 2022-11-16 ENCOUNTER — Telehealth: Payer: Self-pay | Admitting: *Deleted

## 2022-11-16 ENCOUNTER — Inpatient Hospital Stay: Payer: Medicaid Other | Admitting: Oncology

## 2022-11-16 DIAGNOSIS — Z9181 History of falling: Secondary | ICD-10-CM | POA: Diagnosis not present

## 2022-11-16 DIAGNOSIS — Z6823 Body mass index (BMI) 23.0-23.9, adult: Secondary | ICD-10-CM | POA: Diagnosis not present

## 2022-11-16 DIAGNOSIS — A419 Sepsis, unspecified organism: Secondary | ICD-10-CM | POA: Diagnosis not present

## 2022-11-16 DIAGNOSIS — J90 Pleural effusion, not elsewhere classified: Secondary | ICD-10-CM | POA: Diagnosis not present

## 2022-11-16 DIAGNOSIS — D509 Iron deficiency anemia, unspecified: Secondary | ICD-10-CM | POA: Diagnosis not present

## 2022-11-16 DIAGNOSIS — E039 Hypothyroidism, unspecified: Secondary | ICD-10-CM | POA: Diagnosis not present

## 2022-11-16 DIAGNOSIS — Z87891 Personal history of nicotine dependence: Secondary | ICD-10-CM | POA: Diagnosis not present

## 2022-11-16 DIAGNOSIS — J9621 Acute and chronic respiratory failure with hypoxia: Secondary | ICD-10-CM | POA: Diagnosis not present

## 2022-11-16 DIAGNOSIS — J441 Chronic obstructive pulmonary disease with (acute) exacerbation: Secondary | ICD-10-CM | POA: Diagnosis not present

## 2022-11-16 DIAGNOSIS — D696 Thrombocytopenia, unspecified: Secondary | ICD-10-CM | POA: Diagnosis not present

## 2022-11-16 DIAGNOSIS — J9622 Acute and chronic respiratory failure with hypercapnia: Secondary | ICD-10-CM | POA: Diagnosis not present

## 2022-11-16 DIAGNOSIS — I5033 Acute on chronic diastolic (congestive) heart failure: Secondary | ICD-10-CM | POA: Diagnosis not present

## 2022-11-16 DIAGNOSIS — I959 Hypotension, unspecified: Secondary | ICD-10-CM | POA: Diagnosis not present

## 2022-11-16 DIAGNOSIS — C884 Extranodal marginal zone B-cell lymphoma of mucosa-associated lymphoid tissue [MALT-lymphoma]: Secondary | ICD-10-CM | POA: Diagnosis not present

## 2022-11-16 DIAGNOSIS — D61818 Other pancytopenia: Secondary | ICD-10-CM | POA: Diagnosis not present

## 2022-11-16 DIAGNOSIS — C8580 Other specified types of non-Hodgkin lymphoma, unspecified site: Secondary | ICD-10-CM

## 2022-11-16 DIAGNOSIS — Z794 Long term (current) use of insulin: Secondary | ICD-10-CM | POA: Diagnosis not present

## 2022-11-16 DIAGNOSIS — E119 Type 2 diabetes mellitus without complications: Secondary | ICD-10-CM | POA: Diagnosis not present

## 2022-11-16 MED ORDER — LENALIDOMIDE 10 MG PO CAPS: 10.0000 mg | ORAL_CAPSULE | Freq: Every day | ORAL | 0 refills | Status: DC

## 2022-11-16 NOTE — Telephone Encounter (Signed)
1015 am.  Phone call made to patient to follow up on hospitalization and schedule a home visit.  No answer and VM is full.  Phone call made to patient's daughter [name] and visit is scheduled for Monday at 42.  Patient is receiving home health services per daughter.

## 2022-11-16 NOTE — Progress Notes (Signed)
  Care Coordination  Note  11/16/2022 Name: Brookelynn Hamor Dickman MRN: [id number] DOB: [birth year]  Prentice Docker [place] is a 77 y.o. year old primary care patient of [name], NP. I reached out to [place] by phone today to assist with scheduling a follow up appointment. Prentice Docker [place] verbally consented to my assistance.       Follow up plan: Unsuccessful telephone outreach attempt made. A HIPAA compliant phone message was left for the patient providing contact information and requesting a return call.   Julian Hy, [place] Direct Dial: [phone]

## 2022-11-16 NOTE — Progress Notes (Signed)
  Care Coordination  Note  11/16/2022 Name: Natasha [name] MRN: [id number] DOB: 1946/09/21  Natasha [name] is a 77 y.o. year old primary care patient of [name], NP. I reached out to [place] by phone today to assist with scheduling a follow up appointment. Natasha [name] verbally consented to my assistance.       Follow up plan: Hospital Follow Up appointment scheduled with Toy Care, NP) on (11/26/2022) at (1120am).  Julian Hy, [place] Direct Dial: [phone]

## 2022-11-16 NOTE — Patient Outreach (Signed)
  Care Coordination TOC Note Transition Care Management Follow-up Telephone Call Date of discharge and from where: [id number] [facility] Acute GI Bleed How have you been since you were released from the hospital? Per daughter she is doing pretty good Any questions or concerns? No  Items Reviewed: Did the pt receive and understand the discharge instructions provided? Yes  Medications obtained and verified? Yes  Other? No  Any new allergies since your discharge? No  Dietary orders reviewed? No Do you have support at home? Yes   Home Care and Equipment/Supplies: Were home health services ordered? Yes PT/OT If so, what is the name of the agency? [facility]  Has the agency set up a time to come to the patient's home? yes Were any new equipment or medical supplies ordered?  No What is the name of the medical supply agency? N  Were you able to get the supplies/equipment? not applicable Do you have any questions related to the use of the equipment or supplies? No  Functional Questionnaire: (I = Independent and D = Dependent) ADLs: D  Bathing/Dressing- D  Meal Prep- D  Eating- I  Maintaining continence- I  Transferring/Ambulation- I  Managing Meds- I  Follow up appointments reviewed:  PCP Hospital f/u appt confirmed? No  RN will send for scheduling of appointment [facility] f/u appt confirmed? Yes  Scheduled to see Dr Janese Banks [id number] 10:45 . Are transportation arrangements needed? No  If their condition worsens, is the pt aware to call PCP or go to the Emergency Dept.? Yes Was the patient provided with contact information for the PCP's office or ED? Yes Was to pt encouraged to call back with questions or concerns? Yes  SDOH assessments and interventions completed:   No   Care Coordination Interventions:  PCP follow up appointment requested   Encounter Outcome:  Pt. Visit Completed    [facility] [phone]

## 2022-11-17 ENCOUNTER — Inpatient Hospital Stay: Payer: 59

## 2022-11-17 ENCOUNTER — Ambulatory Visit: Payer: Medicaid Other

## 2022-11-17 ENCOUNTER — Other Ambulatory Visit: Payer: Self-pay | Admitting: *Deleted

## 2022-11-17 ENCOUNTER — Inpatient Hospital Stay (HOSPITAL_BASED_OUTPATIENT_CLINIC_OR_DEPARTMENT_OTHER): Payer: 59 | Admitting: Oncology

## 2022-11-17 ENCOUNTER — Inpatient Hospital Stay: Payer: 59 | Admitting: Pharmacist

## 2022-11-17 VITALS — BP 98/51 | HR 96 | Temp 99.1°F | Resp 18

## 2022-11-17 VITALS — BP 93/49 | HR 89 | Resp 19 | Ht 64.0 in | Wt 165.0 lb

## 2022-11-17 DIAGNOSIS — Z79620 Long term (current) use of immunosuppressive biologic: Secondary | ICD-10-CM | POA: Diagnosis not present

## 2022-11-17 DIAGNOSIS — C8590 Non-Hodgkin lymphoma, unspecified, unspecified site: Secondary | ICD-10-CM

## 2022-11-17 DIAGNOSIS — D649 Anemia, unspecified: Secondary | ICD-10-CM | POA: Diagnosis not present

## 2022-11-17 DIAGNOSIS — Z79899 Other long term (current) drug therapy: Secondary | ICD-10-CM | POA: Diagnosis not present

## 2022-11-17 DIAGNOSIS — M4856XA Collapsed vertebra, not elsewhere classified, lumbar region, initial encounter for fracture: Secondary | ICD-10-CM | POA: Diagnosis not present

## 2022-11-17 DIAGNOSIS — I959 Hypotension, unspecified: Secondary | ICD-10-CM | POA: Diagnosis not present

## 2022-11-17 DIAGNOSIS — R161 Splenomegaly, not elsewhere classified: Secondary | ICD-10-CM | POA: Diagnosis not present

## 2022-11-17 DIAGNOSIS — I509 Heart failure, unspecified: Secondary | ICD-10-CM | POA: Diagnosis not present

## 2022-11-17 DIAGNOSIS — M47814 Spondylosis without myelopathy or radiculopathy, thoracic region: Secondary | ICD-10-CM | POA: Diagnosis not present

## 2022-11-17 DIAGNOSIS — J449 Chronic obstructive pulmonary disease, unspecified: Secondary | ICD-10-CM | POA: Diagnosis not present

## 2022-11-17 DIAGNOSIS — E119 Type 2 diabetes mellitus without complications: Secondary | ICD-10-CM | POA: Diagnosis not present

## 2022-11-17 DIAGNOSIS — M5136 Other intervertebral disc degeneration, lumbar region: Secondary | ICD-10-CM | POA: Diagnosis not present

## 2022-11-17 DIAGNOSIS — C8300 Small cell B-cell lymphoma, unspecified site: Secondary | ICD-10-CM

## 2022-11-17 DIAGNOSIS — J90 Pleural effusion, not elsewhere classified: Secondary | ICD-10-CM | POA: Diagnosis not present

## 2022-11-17 DIAGNOSIS — M7989 Other specified soft tissue disorders: Secondary | ICD-10-CM | POA: Diagnosis not present

## 2022-11-17 DIAGNOSIS — R5383 Other fatigue: Secondary | ICD-10-CM | POA: Diagnosis not present

## 2022-11-17 DIAGNOSIS — K922 Gastrointestinal hemorrhage, unspecified: Secondary | ICD-10-CM | POA: Diagnosis not present

## 2022-11-17 DIAGNOSIS — Z5181 Encounter for therapeutic drug level monitoring: Secondary | ICD-10-CM

## 2022-11-17 DIAGNOSIS — C8308 Small cell B-cell lymphoma, lymph nodes of multiple sites: Secondary | ICD-10-CM | POA: Diagnosis not present

## 2022-11-17 DIAGNOSIS — K8020 Calculus of gallbladder without cholecystitis without obstruction: Secondary | ICD-10-CM | POA: Diagnosis not present

## 2022-11-17 DIAGNOSIS — R0602 Shortness of breath: Secondary | ICD-10-CM | POA: Diagnosis not present

## 2022-11-17 DIAGNOSIS — C8580 Other specified types of non-Hodgkin lymphoma, unspecified site: Secondary | ICD-10-CM

## 2022-11-17 DIAGNOSIS — J948 Other specified pleural conditions: Secondary | ICD-10-CM | POA: Diagnosis not present

## 2022-11-17 DIAGNOSIS — Z7961 Long term (current) use of immunomodulator: Secondary | ICD-10-CM | POA: Diagnosis not present

## 2022-11-17 DIAGNOSIS — I2510 Atherosclerotic heart disease of native coronary artery without angina pectoris: Secondary | ICD-10-CM | POA: Diagnosis not present

## 2022-11-17 DIAGNOSIS — Z87891 Personal history of nicotine dependence: Secondary | ICD-10-CM | POA: Diagnosis not present

## 2022-11-17 LAB — COMPREHENSIVE METABOLIC PANEL
ALT: 16 U/L (ref 0–44)
AST: 30 U/L (ref 15–41)
Albumin: 2.4 g/dL — ABNORMAL LOW (ref 3.5–5.0)
Alkaline Phosphatase: 208 U/L — ABNORMAL HIGH (ref 38–126)
Anion gap: 8 (ref 5–15)
BUN: 29 mg/dL — ABNORMAL HIGH (ref 8–23)
CO2: 30 mmol/L (ref 22–32)
Calcium: 8.7 mg/dL — ABNORMAL LOW (ref 8.9–10.3)
Chloride: 100 mmol/L (ref 98–111)
Creatinine, Ser: 0.85 mg/dL (ref 0.44–1.00)
GFR, Estimated: >60 mL/min (ref >60)
Glucose, Bld: 158 mg/dL — ABNORMAL HIGH (ref 70–99)
Potassium: 4.4 mmol/L (ref 3.5–5.1)
Sodium: 138 mmol/L (ref 135–145)
Total Bilirubin: 1.1 mg/dL (ref 0.3–1.2)
Total Protein: 4.5 g/dL — ABNORMAL LOW (ref 6.5–8.1)

## 2022-11-17 LAB — CBC WITH DIFFERENTIAL/PLATELET
Abs Immature Granulocytes: 0.2 10*3/uL — ABNORMAL HIGH (ref 0.00–0.07)
Basophils Absolute: 0 10*3/uL (ref 0.0–0.1)
Basophils Relative: 1 %
Eosinophils Absolute: 0.1 10*3/uL (ref 0.0–0.5)
Eosinophils Relative: 2 %
HCT: 23.1 % — ABNORMAL LOW (ref 36.0–46.0)
Hemoglobin: 6.9 g/dL — ABNORMAL LOW (ref 12.0–15.0)
Immature Granulocytes: 5 %
Lymphocytes Relative: 51 %
Lymphs Abs: 2 10*3/uL (ref 0.7–4.0)
MCH: 27.7 pg (ref 26.0–34.0)
MCHC: 29.9 g/dL — ABNORMAL LOW (ref 30.0–36.0)
MCV: 92.8 fL (ref 80.0–100.0)
Monocytes Absolute: 0.5 10*3/uL (ref 0.1–1.0)
Monocytes Relative: 14 %
Neutro Abs: 1 10*3/uL — ABNORMAL LOW (ref 1.7–7.7)
Neutrophils Relative %: 27 %
Platelets: 90 10*3/uL — ABNORMAL LOW (ref 150–400)
RBC: 2.49 MIL/uL — ABNORMAL LOW (ref 3.87–5.11)
RDW: 21.2 % — ABNORMAL HIGH (ref 11.5–15.5)
WBC: 3.8 10*3/uL — ABNORMAL LOW (ref 4.0–10.5)
nRBC: 1 % — ABNORMAL HIGH (ref 0.0–0.2)

## 2022-11-17 LAB — PREPARE RBC (CROSSMATCH)

## 2022-11-17 MED ORDER — SODIUM CHLORIDE 0.9 % IV SOLN
Freq: Once | INTRAVENOUS | Status: AC
  Filled 2022-11-17: qty 250

## 2022-11-17 MED ORDER — AMOXICILLIN-POT CLAVULANATE 875-125 MG PO TABS: 1.0000 | ORAL_TABLET | Freq: Two times a day (BID) | ORAL | 1 refills | Status: DC

## 2022-11-17 MED ORDER — DIPHENHYDRAMINE HCL 50 MG/ML IJ SOLN
25.0000 mg | Freq: Once | INTRAMUSCULAR | Status: AC
  Administered 2022-11-17: 25 mg via INTRAVENOUS
  Filled 2022-11-17: qty 1

## 2022-11-17 MED ORDER — HEPARIN SOD (PORK) LOCK FLUSH 100 UNIT/ML IV SOLN
INTRAVENOUS | Status: AC
  Filled 2022-11-17: qty 5

## 2022-11-17 MED ORDER — DIPHENHYDRAMINE HCL 25 MG PO CAPS
50.0000 mg | ORAL_CAPSULE | Freq: Once | ORAL | Status: AC
  Administered 2022-11-17: 50 mg via ORAL
  Filled 2022-11-17: qty 2

## 2022-11-17 MED ORDER — ACETAMINOPHEN 325 MG PO TABS
650.0000 mg | ORAL_TABLET | Freq: Once | ORAL | Status: AC
  Administered 2022-11-17: 650 mg via ORAL
  Filled 2022-11-17: qty 2

## 2022-11-17 MED ORDER — SODIUM CHLORIDE 0.9 % IV SOLN: 12.0000 mg | Freq: Once | INTRAVENOUS | Status: DC

## 2022-11-17 MED ORDER — SODIUM CHLORIDE 0.9% FLUSH
10.0000 mL | Freq: Once | INTRAVENOUS | Status: AC
  Administered 2022-11-17: 10 mL via INTRAVENOUS
  Filled 2022-11-17: qty 10

## 2022-11-17 MED ORDER — HEPARIN SOD (PORK) LOCK FLUSH 100 UNIT/ML IV SOLN
500.0000 [IU] | Freq: Once | INTRAVENOUS | Status: AC
  Administered 2022-11-17: 500 [IU] via INTRAVENOUS
  Filled 2022-11-17: qty 5

## 2022-11-17 MED ORDER — SODIUM CHLORIDE 0.9 % IV SOLN
400.0000 mg | Freq: Once | INTRAVENOUS | Status: AC
  Administered 2022-11-17: 400 mg via INTRAVENOUS
  Filled 2022-11-17: qty 40

## 2022-11-17 MED ORDER — HEPARIN SOD (PORK) LOCK FLUSH 100 UNIT/ML IV SOLN
500.0000 [IU] | Freq: Once | INTRAVENOUS | Status: DC | PRN
  Filled 2022-11-17: qty 5

## 2022-11-17 MED ORDER — SODIUM CHLORIDE 0.9 % IV SOLN
10.0000 mg | Freq: Once | INTRAVENOUS | Status: AC
  Administered 2022-11-17: 10 mg via INTRAVENOUS
  Filled 2022-11-17: qty 10

## 2022-11-17 NOTE — Progress Notes (Signed)
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  Her most recent CBC from 06/22/2022 showed white count of 4.3, H&H of 8/26.6 with an MCV of 93 and a platelet count of 77.  Looking back at her prior CBCs her hemoglobin was normal at 13.3 until November 2020 and since June 2023 her hemoglobin has been fluctuating between 8.5-9.5.  Platelets were normal up until November 2020 but fluctuating between 70-80 since June 2023.  Her hemoglobin was normal at 13 in May 2021 as well and we do not have any labs between 2021 and 2023.  Patient states that she was diagnosed with a possible lymphoma a few years ago in [place] but was told that she does not require any treatment   Patient had a CT chest abdomen and pelvis with contrast which showed bulky bilateral axillary mediastinal and hilar adenopathy with the largest axillary node measuring 3.3 x 2.5 cm as compared to 1.7 cm back in 2020.  Severe splenomegaly of 21.9 cm.  Bulky intra-abdominal adenopathy.  Moderate right pleural effusion   Flow cytometry from peripheral blood that was done at [facility] showed clonal B cells which was CD5 negative CD10 negative CD103 negative and CD123 negative.  Differential diagnosis includes marginal zone lymphoma, lymphoplasmacytic lymphoma, DLBCL and atypical CLL.  Blood smear  shows a small to medium lymphoid cells with irregular nuclear contours containing moderately condensed chromatin.  Large abnormal lymphocytes are rare.  Taken together these morphological and immunophenotypic findings favor the diagnosis of marginal zone lymphoma.   Patient had pleural fluid tapped as well and cytology was again consistent with involvement with patient's known B-cell lymphoma consistent with peripheral blood flow cytometry pathology.   Plan was to offer Bendamustine Rituxan chemotherapy for 6 cycles.  However patient had repeated episodes of hospitalization for respiratory failure and worsening performance status.  Plan was therefore made to proceed with acalabrutinib instead.  She started taking that on 08/31/2022  No significant response to acalabrutinib after 3 months of therapy.  Also had a major GI bleed.  Plan is to switch her to Rituxan and Revlimid    Interval history-patient is on 4 L of oxygen at baseline.  No acute issues since her last hospitalization.  ECOG PS- 2 Pain scale- 0 Opioid associated constipation- no  Review of systems- Review of Systems  Constitutional:  Positive for malaise/fatigue. Negative for chills, fever and weight loss.  HENT:  Negative for congestion, ear discharge and nosebleeds.   Eyes:  Negative for blurred vision.  Respiratory:  Positive for shortness of breath. Negative for cough, hemoptysis, sputum production and wheezing.   Cardiovascular:  Negative for chest pain, palpitations, orthopnea and claudication.  Gastrointestinal:  Negative for abdominal pain, blood in stool, constipation, diarrhea, heartburn, melena, nausea and vomiting.  Genitourinary:  Negative for dysuria, flank pain, frequency, hematuria and urgency.  Musculoskeletal:  Negative for  back pain, joint pain and myalgias.  Skin:  Negative for rash.  Neurological:  Negative for dizziness, tingling, focal weakness, seizures, weakness and headaches.  Endo/Heme/Allergies:  Does  not bruise/bleed easily.  Psychiatric/Behavioral:  Negative for depression and suicidal ideas. The patient does not have insomnia.       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   CHF (congestive heart failure) (H[facility])    COPD (chronic obstructive pulmonary disease) (H[facility])    Diabetes mellitus without complication (HCC)    Hypotension    Pleural effusion    ARMc 824m,  2 weeks ago   Vaginal delivery    x 5     Past Surgical History:  Procedure Laterality Date   IR IMAGING GUIDED PORT INSERTION  08/16/2022   TONSILLECTOMY Bilateral    as a child    Social History   Socioeconomic History   Marital status: Widowed    Spouse name: Not on file   Number of children: 2   Years of education: Not on file   Highest education level: 9th grade  Occupational History   Occupation: unemployed  Tobacco Use   Smoking status: Former    Packs/day: 2.00    Years: 55.00    Total pack years: 110.00    Types: Cigarettes    Start date: 07/07/1961    Quit date: 08/02/2022    Years since quitting: 0.2   Smokeless tobacco: Never   Tobacco comments:    1/2 pack she states but family says 2 PPD  Vaping Use   Vaping Use: Never used  Substance and Sexual Activity   Alcohol use: No   Drug use: Never   Sexual activity: Not Currently    Partners: Male    Birth control/protection: Post-menopausal  Other Topics Concern   Not on file  Social History Narrative   08/14/20   [place]  Living: to be near daughter   Work: retired      FPhysiological scientistchildren - JIT consultant(nearby) and son JEvelena Peat[place]  8 grandchildren, & 2 great-grandchildren.      Enjoys: stays at home      Exercise: walking to her daughter's store   Diet: eats fruit, air fried chicken      Safety   Seat belts: Yes    Guns: No   Safe in relationships: Yes    Social Determinants of Health   Financial Resource Strain: Medium  Risk (08/24/2022)   Overall Financial Resource Strain (CARDIA)    Difficulty of Paying Living Expenses: Somewhat hard  Food Insecurity: No Food Insecurity (11/08/2022)   Hunger Vital Sign    Worried About Running Out of Food in the Last Year: Never true    Ran Out of Food in the Last Year: Never true  Recent Concern: Food Insecurity - Food Insecurity Present (08/30/2022)   Hunger Vital Sign    Worried About Running Out of Food in the Last Year: Sometimes true    Ran Out of Food in the Last Year: Sometimes true  Transportation Needs: No Transportation Needs (11/08/2022)   PRAPARE - THydrologist(Medical): No    Lack of Transportation (Non-Medical): No  Recent Concern: Transportation Needs - Unmet Transportation Needs (08/30/2022)   PRAPARE - THydrologist(Medical): Yes  Lack of Transportation (Non-Medical): No  Physical Activity: Inactive (08/24/2022)   Exercise Vital Sign    Days of Exercise per Week: 0 days    Minutes of Exercise per Session: 0 min  Stress: Stress Concern Present (08/24/2022)   [place]    Feeling of Stress : To some extent  Social Connections: Socially Isolated (08/24/2022)   Social Connection and Isolation Panel [NHANES]    Frequency of Communication with Friends and Family: More than three times a week    Frequency of Social Gatherings with Friends and Family: More than three times a week    Attends Religious Services: Never    Marine scientist or Organizations: No    Attends Archivist Meetings: Never    Marital Status: Widowed  Intimate Partner Violence: Not At Risk (11/08/2022)   Humiliation, Afraid, Rape, and Kick questionnaire    Fear of Current or Ex-Partner: No    Emotionally Abused: No    Physically Abused: No    Sexually Abused: No    Family History  Problem Relation Age of Onset   Breast cancer Mother     Alzheimer's disease Father    Lung cancer Father        lung   Varicose Veins Brother    Heart attack Brother      Current Outpatient Medications:    acyclovir (ZOVIRAX) 400 MG tablet, Take 400 mg by mouth daily., Disp: , Rfl:    albuterol (VENTOLIN HFA) 108 (90 Base) MCG/ACT inhaler, TAKE 2 PUFFS BY MOUTH EVERY 6 HOURS AS NEEDED FOR WHEEZE OR SHORTNESS OF BREATH (Patient taking differently: Inhale 2 puffs into the lungs every 6 (six) hours as needed for wheezing or shortness of breath. TAKE 2 PUFFS BY MOUTH EVERY 6 HOURS AS NEEDED FOR WHEEZE OR SHORTNESS OF BREATH), Disp: 18 g, Rfl: 1   amoxicillin-clavulanate (AUGMENTIN) 875-125 MG tablet, Take 1 tablet by mouth 2 (two) times daily., Disp: 60 tablet, Rfl: 1   clobetasol cream (TEMOVATE) 9.75 %, Apply 1 Application topically daily., Disp: , Rfl:    furosemide (LASIX) 20 MG tablet, Take 1 tablet (20 mg total) by mouth daily., Disp: 90 tablet, Rfl: 3   insulin NPH-regular Human (70-30) 100 UNIT/ML injection, Inject 4 Units into the skin 2 (two) times daily with a meal. Use only for hyperglycemia, CBG+ 300. Start with 5 units, titrate up to max 10 unit per dose if need., Disp: 10 mL, Rfl: 0   ipratropium-albuterol (DUONEB) 0.5-2.5 (3) MG/3ML SOLN, Take 3 mLs by nebulization every 6 (six) hours as needed (shortness of breath or wheezing)., Disp: , Rfl:    lenalidomide (REVLIMID) 10 MG capsule, Take 1 capsule (10 mg total) by mouth daily. Take for 14 days, then hold for 7 days., Disp: 14 capsule, Rfl: 0   levothyroxine (SYNTHROID) 50 MCG tablet, Take 1 tablet (50 mcg total) by mouth daily at 6 (six) AM., Disp: 30 tablet, Rfl: 0   loperamide (IMODIUM) 2 MG capsule, Take 2 mg by mouth as needed for diarrhea or loose stools., Disp: , Rfl:    midodrine (PROAMATINE) 10 MG tablet, Take 1 tablet (10 mg total) by mouth 3 (three) times daily with meals., Disp: 60 tablet, Rfl: 0   ondansetron (ZOFRAN) 8 MG tablet, Take 8 mg by mouth every 8 (eight) hours as  needed for nausea, vomiting or refractory nausea / vomiting. Take 1 tablet q6H for 4 days after chemotherapy., Disp: ,  Rfl:    pantoprazole (PROTONIX) 40 MG tablet, Take 1 tablet (40 mg total) by mouth daily., Disp: 30 tablet, Rfl: 0   prochlorperazine (COMPAZINE) 10 MG tablet, Take 1 tablet by mouth every 6 (six) hours as needed for nausea or vomiting. Take 1 tab q6H for 4 days after chemotherapy., Disp: , Rfl:    simethicone (MYLICON) 80 MG chewable tablet, Chew 180 mg by mouth every 6 (six) hours as needed for flatulence. Take 1 capsule PO PRN gas relief., Disp: , Rfl:  No current facility-administered medications for this visit.  Facility-Administered Medications Ordered in Other Visits:    heparin lock flush 100 unit/mL, 500 Units, Intravenous, Once, Sindy Guadeloupe, MD   heparin lock flush 100 unit/mL, 500 Units, Intracatheter, Once PRN, Sindy Guadeloupe, MD  Physical exam:  Vitals:   11/17/22 0839 11/17/22 0845  BP:  (!) 93/49  Pulse:  89  Resp:  19  SpO2:  100%  Weight: 165 lb (74.8 kg)   Height: '5\' 4"'$  (1.626 m)    Physical Exam Constitutional:      Comments: Sitting in a wheelchair.  Appears in no acute distress.  On chronic oxygen  Eyes:     Pupils: Pupils are equal, round, and reactive to light.  Cardiovascular:     Rate and Rhythm: Normal rate and regular rhythm.     Heart sounds: Normal heart sounds.  Pulmonary:     Effort: Pulmonary effort is normal.     Breath sounds: Normal breath sounds.  Abdominal:     General: Bowel sounds are normal.     Palpations: Abdomen is soft.  Skin:    General: Skin is warm and dry.  Neurological:     Mental Status: She is alert and oriented to person, place, and time.         Latest Ref Rng & Units 11/17/2022    8:24 AM  CMP  Glucose 70 - 99 mg/dL 158   BUN 8 - 23 mg/dL 29   Creatinine 0.44 - 1.00 mg/dL 0.85   Sodium 135 - 145 mmol/L 138   Potassium 3.5 - 5.1 mmol/L 4.4   Chloride 98 - 111 mmol/L 100   CO2 22 - 32 mmol/L 30    Calcium 8.9 - 10.3 mg/dL 8.7   Total Protein 6.5 - 8.1 g/dL 4.5   Total Bilirubin 0.3 - 1.2 mg/dL 1.1   Alkaline Phos 38 - 126 U/L 208   AST 15 - 41 U/L 30   ALT 0 - 44 U/L 16       Latest Ref Rng & Units 11/17/2022    8:24 AM  CBC  WBC 4.0 - 10.5 K/uL 3.8   Hemoglobin 12.0 - 15.0 g/dL 6.9   Hematocrit 36.0 - 46.0 % 23.1   Platelets 150 - 400 K/uL 90     No images are attached to the encounter.  US THORACENTESIS ASP PLEURAL SPACE W/IMG GUIDE  Result Date: 11/11/2022 INDICATION: Pleural effusion EXAM: ULTRASOUND GUIDED RIGHT THORACENTESIS MEDICATIONS: 8 cc 1% lidocaine COMPLICATIONS: None immediate. PROCEDURE: An ultrasound guided thoracentesis was thoroughly discussed with the patient and questions answered. The benefits, risks, alternatives and complications were also discussed. The patient understands and wishes to proceed with the procedure. Written consent was obtained. Ultrasound was performed to localize and mark an adequate pocket of fluid in the right chest. The area was then prepped and draped in the normal sterile fashion. 1% Lidocaine was used for local anesthesia. Under ultrasound  guidance a 6 Fr Safe-T-Centesis catheter was introduced. Thoracentesis was performed. The catheter was removed and a dressing applied. FINDINGS: A total of approximately 1.2 L of hazy amber fluid was removed. Samples were sent to the laboratory as requested by the clinical team. IMPRESSION: Successful ultrasound guided right thoracentesis yielding 1.2 L of pleural fluid. Follow-up chest x-ray revealed no evidence of pneumothorax. Read by: Reatha Armour, PA-C Electronically Signed   By: Jerilynn Mages.  Shick M.D.   On: 11/11/2022 07:54   DG Chest Port 1 View  Result Date: 11/10/2022 CLINICAL DATA:  Status post right thoracentesis, pleural effusion. EXAM: PORTABLE CHEST 1 VIEW COMPARISON:  Chest CT 11/07/2022 and chest radiograph from the same date FINDINGS: Power injectable right Port-A-Cath tip: SVC. Moderate  bilateral pleural effusions are observed. No visible pneumothorax. Some improvement in aeration in the right mid lung likely reflects reduced pleural fluid related to the thoracentesis. Upper normal heart size. Indistinct pulmonary vasculature, cannot exclude pulmonary venous hypertension and mild edema. Rounded density in the right mid lung compatible with pulmonary nodule (previously shown to be partially calcified). IMPRESSION: 1. No pneumothorax status post right thoracentesis. 2. Moderate bilateral pleural effusions. 3. Indistinct pulmonary vasculature, cannot exclude pulmonary venous hypertension and mild edema. 4. Rounded density in the right mid lung compatible with a pulmonary nodule (previously shown to be partially calcified). 5. Upper normal heart size. Electronically Signed   By: Van Clines M.D.   On: 11/10/2022 16:30   CT CHEST ABDOMEN PELVIS W CONTRAST  Result Date: 11/07/2022 CLINICAL DATA:  Sepsis. Dementia. Lymphoma. Hemorrhagic shock requiring blood transfusions and pressor support. * Tracking Code: BO * EXAM: CT CHEST, ABDOMEN, AND PELVIS WITH CONTRAST TECHNIQUE: Multidetector CT imaging of the chest, abdomen and pelvis was performed following the standard protocol during bolus administration of intravenous contrast. RADIATION DOSE REDUCTION: This exam was performed according to the departmental dose-optimization program which includes automated exposure control, adjustment of the mA and/or kV according to patient size and/or use of iterative reconstruction technique. CONTRAST:  152m OMNIPAQUE IOHEXOL 300 MG/ML  SOLN COMPARISON:  Chest radiograph from earlier today. 09/19/2022 CT abdomen/pelvis. 07/12/2022 chest CT angiogram. FINDINGS: CT CHEST FINDINGS Cardiovascular: Normal heart size. No significant pericardial effusion/thickening. Left anterior descending coronary atherosclerosis. Right internal jugular Port-A-Cath terminates at the cavoatrial junction. Atherosclerotic  nonaneurysmal thoracic aorta. Normal caliber pulmonary arteries. No central pulmonary emboli. Mediastinum/Nodes: No significant thyroid nodules. Unremarkable esophagus. Widespread mild to moderate bilateral axillary, bilateral lower neck, bilateral mediastinal and bilateral hilar lymphadenopathy, overall mildly decreased. Representative 1.6 cm right axillary node (series 3/image 23), decreased from 2.0 cm. Representative 1.7 cm right paratracheal node (series 3/image 19), mildly decreased from 1.9 cm. Representative 1.6 cm AP window node (series 3/image 20), stable. Lungs/Pleura: No pneumothorax. Moderate to large right and small to moderate left dependent pleural effusions with mild smooth bilateral pleural thickening and enhancement. New patchy consolidation in the posterior right upper lobe. New prominent patchy tree-in-bud opacities throughout the bilateral upper lobes. Calcified 1.9 cm posterior right upper lobe nodule is stable. Moderate compressive atelectasis in the posterior lower lobes bilaterally. Musculoskeletal: No aggressive appearing focal osseous lesions. Mild thoracic spondylosis. Mild anasarca. CT ABDOMEN PELVIS FINDINGS Hepatobiliary: Normal liver with no liver mass. Gallstones within the contracted gallbladder with no definite gallbladder wall thickening. No biliary ductal dilatation. Pancreas: Normal, with no mass or duct dilation. Spleen: Marked splenomegaly measuring 21.5 cm craniocaudal length, previously 19.7 cm, mildly increased. No splenic masses. Adrenals/Urinary Tract: Normal adrenals. Normal kidneys with no  hydronephrosis and no renal mass. Well-positioned Foley catheter within the bladder. Expected minimal gas in the nondependent bladder from instrumentation. Chronic mild diffuse bladder wall thickening. Stomach/Bowel: Normal non-distended stomach. Normal caliber small bowel loops. Appendix not discretely visualized. Mild wall thickening in the nondilated terminal ileum and cecum in  the right lower quadrant. No additional sites of small or large bowel wall thickening. No significant diverticulosis. Vascular/Lymphatic: Atherosclerotic nonaneurysmal abdominal aorta. Patent portal, splenic, hepatic and renal veins. Widespread moderate lymphadenopathy throughout the retroperitoneum and bilateral pelvis is overall slightly decreased from 09/19/2022 CT. Representative 2.3 cm portacaval node (series 3/image 63), decreased from 2.5 cm. Representative 1.7 cm left para-aortic node (series 3/image 69), decreased from 2.0 cm. Representative 1.8 cm right inguinal node (series 3/image 111), decreased from 2.0 cm. Reproductive: Grossly normal uterus.  No adnexal mass. Other: No pneumoperitoneum. No focal fluid collection. Moderate anasarca. Musculoskeletal: No aggressive appearing focal osseous lesions. Chronic L2 and L3 vertebral compression fractures. Mild-to-moderate lumbar degenerative disc disease. IMPRESSION: 1. Moderate to large right and small to moderate left dependent pleural effusions with mild smooth bilateral pleural thickening and enhancement. 2. New patchy consolidation in the posterior right upper lobe. New prominent patchy tree-in-bud opacities throughout the bilateral upper lobes. Findings are most compatible with multilobar bronchopneumonia. 3. Widespread lymphadenopathy in the chest, abdomen and pelvis is overall mildly decreased from prior CT imaging. 4. Marked splenomegaly, mildly increased. 5. Mild wall thickening in the nondilated terminal ileum and cecum in the right lower quadrant, nonspecific, cannot exclude third-spacing, ischemia or infectious enterocolitis. No evidence of bowel obstruction. No pneumatosis. No pneumoperitoneum. 6. Moderate anasarca. 7. Cholelithiasis. 8. Chronic mild diffuse bladder wall thickening, nonspecific. 9.  Aortic Atherosclerosis (ICD10-I70.0). Electronically Signed   By: Ilona Sorrel M.D.   On: 11/07/2022 16:31   DG Chest Port 1 View  Result Date:  11/07/2022 CLINICAL DATA:  Dyspnea, vomiting, rectal bleeding, COPD, lymphoma EXAM: PORTABLE CHEST 1 VIEW COMPARISON:  10/27/2022 chest radiograph. FINDINGS: Stable right internal jugular Port-A-Cath terminating over the cavoatrial junction. Stable cardiomediastinal silhouette with mild cardiomegaly. No pneumothorax. Similar small bilateral pleural effusions. Moderate diffuse prominence of the parahilar interstitial markings with patchy bilateral parahilar and bibasilar lung opacities, worsened. IMPRESSION: 1. Mild cardiomegaly. Moderate diffuse prominence of the parahilar interstitial markings with patchy bilateral parahilar and bibasilar lung opacities, worsened, favor congestive heart failure with pulmonary edema. A component of aspiration or pneumonia at the lung bases is not excluded. 2. Similar small bilateral pleural effusions. Electronically Signed   By: Ilona Sorrel M.D.   On: 11/07/2022 14:06   US THORACENTESIS ASP PLEURAL SPACE W/IMG GUIDE  Result Date: 10/28/2022 INDICATION: Right pleural effusion EXAM: ULTRASOUND GUIDED RIGHT THORACENTESIS MEDICATIONS: 10 cc 1% lidocaine COMPLICATIONS: None immediate. PROCEDURE: An ultrasound guided thoracentesis was thoroughly discussed with the patient and questions answered. The benefits, risks, alternatives and complications were also discussed. The patient understands and wishes to proceed with the procedure. Written consent was obtained. Ultrasound was performed to localize and mark an adequate pocket of fluid in the right chest. The area was then prepped and draped in the normal sterile fashion. 1% Lidocaine was used for local anesthesia. Under ultrasound guidance a 6 Fr Safe-T-Centesis catheter was introduced. Thoracentesis was performed. The catheter was removed and a dressing applied. FINDINGS: A total of approximately 1.0 L of amber fluid was removed. Ordering provider did not request laboratory samples IMPRESSION: Successful ultrasound guided right  thoracentesis yielding 1.0 L of pleural fluid. Follow-up chest x-ray revealed no evidence  of pneumothorax. Read by: Reatha Armour, PA-C Electronically Signed   By: Albin Felling M.D.   On: 10/28/2022 08:05   US Venous Img Lower Bilateral (DVT)  Result Date: 10/27/2022 CLINICAL DATA:  Bilateral leg pain. EXAM: BILATERAL LOWER EXTREMITY VENOUS DOPPLER ULTRASOUND TECHNIQUE: Gray-scale sonography with graded compression, as well as color Doppler and duplex ultrasound were performed to evaluate the lower extremity deep venous systems from the level of the common femoral vein and including the common femoral, femoral, profunda femoral, popliteal and calf veins including the posterior tibial, peroneal and gastrocnemius veins when visible. The superficial great saphenous vein was also interrogated. Spectral Doppler was utilized to evaluate flow at rest and with distal augmentation maneuvers in the common femoral, femoral and popliteal veins. COMPARISON:  CT AP, 09/19/2022. FINDINGS: RIGHT LOWER EXTREMITY VENOUS Normal compressibility of the RIGHT common femoral, superficial femoral, and popliteal veins, as well as the visualized calf veins. Visualized portions of profunda femoral vein and great saphenous vein unremarkable. No filling defects to suggest DVT on grayscale or color Doppler imaging. Doppler waveforms show normal direction of venous flow, normal respiratory plasticity and response to augmentation. OTHER No evidence of superficial thrombophlebitis or abnormal fluid collection. Pathologically-enlarged RIGHT inguinal lymph nodes are imaged. Limitations: none LEFT LOWER EXTREMITY VENOUS Normal compressibility of the LEFT common femoral, superficial femoral, and popliteal veins, as well as the visualized calf veins. Visualized portions of profunda femoral vein and great saphenous vein unremarkable. No filling defects to suggest DVT on grayscale or color Doppler imaging. Doppler waveforms show normal direction of  venous flow, normal respiratory plasticity and response to augmentation. OTHER No evidence of superficial thrombophlebitis or abnormal fluid collection. Pathologically-enlarged LEFT inguinal lymph nodes are imaged. Limitations: none IMPRESSION: 1. No evidence of femoropopliteal DVT or superficial thrombophlebitis within either lower extremity. 2. Inguinal lymphadenopathy, best appreciated on most recent comparison CT and consistent with history of lymphoma. Michaelle Birks, MD [facility] Radiology Electronically Signed   By: Michaelle Birks M.D.   On: 10/27/2022 17:29   DG Chest Port 1 View  Result Date: 10/27/2022 CLINICAL DATA:  Post thoracentesis EXAM: PORTABLE CHEST 1 VIEW COMPARISON:  10/25/2022, CT 07/12/2022, chest x-ray 10/20/2022 FINDINGS: Right-sided central venous port tip over the SVC. Cardiomegaly with vascular congestion and probable edema. Moderate bilateral pleural effusions, increased on the left, decreased on the right. No right pneumothorax. Basilar airspace disease. IMPRESSION: 1. Moderate bilateral pleural effusions, increased on the left and decreased on the right. No pneumothorax. 2. Cardiomegaly with vascular congestion and probable edema. Basilar airspace disease persists. Electronically Signed   By: Donavan Foil M.D.   On: 10/27/2022 16:28   [place] CHEST (PLEURAL EFFUSION)  Result Date: 10/26/2022 CLINICAL DATA:  77 year old female with pleural effusion presenting for thoracentesis. EXAM: CHEST ULTRASOUND COMPARISON:  10/25/2022 FINDINGS: Large, simple appearing right pleural effusion. IMPRESSION: Large right pleural effusion. No thoracentesis was performed at this time due to significant hypotension. The patient may return for thoracentesis once resolved. Ruthann Cancer, MD [facility] Radiology Electronically Signed   By: Ruthann Cancer M.D.   On: 10/26/2022 10:13   DG Chest Port 1 View  Result  Date: 10/25/2022 CLINICAL DATA:  Pleural effusion.  Productive cough. EXAM: PORTABLE CHEST 1 VIEW COMPARISON:  October 20, 2022 FINDINGS: Cardiomediastinal silhouette is stable. Stable right Port-A-Cath. No pneumothorax. Small left effusion with underlying atelectasis remains. A right pleural effusion with underlying opacity has increased. Diffuse bilateral pulmonary opacities are  identified, worsened in the interval. No other interval changes. IMPRESSION: 1. Worsening right effusion with underlying opacity. Stable left effusion. 2. Diffuse bilateral pulmonary opacities may represent worsening pulmonary edema or a diffuse infectious process. Recommend clinical correlation. Electronically Signed   By: Dorise Bullion III M.D.   On: 10/25/2022 11:07   DG Chest Port 1 View  Result Date: 10/20/2022 CLINICAL DATA:  77 year old female with possible sepsis. EXAM: PORTABLE CHEST 1 VIEW COMPARISON:  Portable chest 09/20/2022 and earlier. FINDINGS: Portable AP semi upright view at 1019 hours. Ongoing moderate bilateral pleural effusions, not significantly changed. Right chest power port redemonstrated. Stable visible mediastinal contours. Partially calcified right lung nodule appears stable from a a chest CT 04/24/2022 (please see that report). Pulmonary vascularity appears stable since November. No pneumothorax. Paucity of bowel gas in the visible abdomen. Stable visualized osseous structures. IMPRESSION: 1. Ongoing moderate bilateral pleural effusions and pulmonary vascular congestion or mild edema, not significantly changed since November. 2. No new cardiopulmonary abnormality. Electronically Signed   By: Genevie Ann M.D.   On: 10/20/2022 10:31     Assessment and plan- Patient is a 77 y.o. female with history of stage IV nodal marginal zone lymphoma here for on treatment assessment prior to cycle 1 day 1 of split dose Rituxan  Patient has not had any improvement in her adenopathy, pleural effusion splenomegaly or  pancytopenia despite 3 months of acalabrutinib.  She also had another major episode of GI bleed for which she was admitted to the hospital.  I therefore recommend not doing acalabrutinib anymore.  I would like to proceed with 4 weekly doses of Rituxan given as a split dose over 2 days followed by consideration for monthly Rituxan for 5 months.  Discussed risks and benefits of Rituxan including all but not limited to possible risk of infusion reaction, risk of infections and hospitalizations.  Patient remains high risk for these complications due to her disease burden and significant neutropenia.  I am starting her on Augmentin for antibiotic prophylaxis.  She also has hepatitis B core antibody positive and I am starting her on tenofovir prophylaxis as well.  I discussed that the alternative to not treating this would be continued decline in her health and performance status.  If she chooses not to proceed with treatment and she will need to consider home hospice.  Patient would like to proceed with treatment today.  I will see her back in 1 week for cycle 2  Hemoglobin is 6.9 today likely secondary to underlying lymphoma.  She will be receiving 1 unit of blood transfusion later this week.  I will plan to start Revlimid after 4 weekly doses of Rituxan.   Visit Diagnosis 1. 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 Encounter for monitoring rituximab therapy   3. 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 Randa Evens, MD, MPH [facility] [phone] 11/17/2022 12:36 PM

## 2022-11-17 NOTE — Progress Notes (Signed)
Put in orders for blood

## 2022-11-17 NOTE — Addendum Note (Signed)
Addended by: Sindy Guadeloupe on: 11/17/2022 02:36 PM   Modules accepted: Orders

## 2022-11-17 NOTE — Progress Notes (Signed)
Patient not seen today d/t Revlimid start being on hold

## 2022-11-17 NOTE — Patient Instructions (Signed)
[facility]  Discharge Instructions: Thank you for choosing [facility] to provide your oncology and hematology care.  If you have a lab appointment with the Cancer Center, please go directly to the Cancer Center and check in at the registration area.  Wear comfortable clothing and clothing appropriate for easy access to any Portacath or PICC line.   We strive to give you quality time with your provider. You may need to reschedule your appointment if you arrive late (15 or more minutes).  Arriving late affects you and other patients whose appointments are after yours.  Also, if you miss three or more appointments without notifying the office, you may be dismissed from the clinic at the provider's discretion.      For prescription refill requests, have your pharmacy contact our office and allow 72 hours for refills to be completed.    Today you received the following chemotherapy and/or immunotherapy agents Rituximab      To help prevent nausea and vomiting after your treatment, we encourage you to take your nausea medication as directed.  BELOW ARE SYMPTOMS THAT SHOULD BE REPORTED IMMEDIATELY: *FEVER GREATER THAN 100.4 F (38 C) OR HIGHER *CHILLS OR SWEATING *NAUSEA AND VOMITING THAT IS NOT CONTROLLED WITH YOUR NAUSEA MEDICATION *UNUSUAL SHORTNESS OF BREATH *UNUSUAL BRUISING OR BLEEDING *URINARY PROBLEMS (pain or burning when urinating, or frequent urination) *BOWEL PROBLEMS (unusual diarrhea, constipation, pain near the anus) TENDERNESS IN MOUTH AND THROAT WITH OR WITHOUT PRESENCE OF ULCERS (sore throat, sores in mouth, or a toothache) UNUSUAL RASH, SWELLING OR PAIN  UNUSUAL VAGINAL DISCHARGE OR ITCHING   Items with * indicate a potential emergency and should be followed up as soon as possible or go to the Emergency Department if any problems should occur.  Please show the CHEMOTHERAPY ALERT CARD or IMMUNOTHERAPY ALERT CARD at check-in to  the Emergency Department and triage nurse.  Should you have questions after your visit or need to cancel or reschedule your appointment, please contact [facility]  [phone] and follow the prompts.  Office hours are 8:00 a.m. to 4:30 p.m. Monday - Friday. Please note that voicemails left after 4:00 p.m. may not be returned until the following business day.  We are closed weekends and major holidays. You have access to a nurse at all times for urgent questions. Please call the main number to the clinic [phone] and follow the prompts.  For any non-urgent questions, you may also contact your provider using MyChart. We now offer e-Visits for anyone 18 and older to request care online for non-urgent symptoms. For details visit [email or web address].   Also download the MyChart app! Go to the app store, search "MyChart", open the app, select [facility], and log in with your MyChart username and password.    

## 2022-11-17 NOTE — Progress Notes (Signed)
1106: B/P 88/51. Per [name] RN per Dr. Janese Banks okay to proceed with Rituximab.    1327: Temp 100.7, rt ear and neck noted to be flushed. Pt denies any other symptoms at this time. Rituximab paused. Dr. Janese Banks notified, Per Dr. Janese Banks continue to hold Rituximab and she will report to chairside.  1341: Per Dr. Janese Banks after assessment, monitor for 15 minutes and recheck VS.   1358:  Temp 100.6, flushing in rt ear and neck improving, pt reports itching of the upper abdomen, areas on upper chest and upper abdomen noted to be reddened.  Dr. Janese Banks made aware. Per Dr. Janese Banks give 25 mg IV Benadryl and monitor for 15 minutes   1406:  25 Mg IV benadryl given over 6 minutes.   1430: Temp 101.2, flushing in ear and neck resolved, reddened areas to chest and upper abdomen significantly improved. Pt resting well, and denies any concerns. Pt stable. Dr. Janese Banks aware.  Per Dr. Janese Banks give 650 Mg PO Tylenol once and reassess in 15 mins.   1452: temp 100.5 B/P 93/46, all other symptoms resolved. Per Dr. Janese Banks restart Rituximab at the '100mg'$ /hr dose (72.5 ml/hr) and infuse remainder of Rituximab at this rate.   Per Dr. Janese Banks for 11/18/22 and cycle 2 Rituximab infusions:  Rituximab will be spilt into a day one and day 2.  2. All Rituximab infusions should titrate per protocol up to 150 mg/hr (third rate) and remain at this rate for the remainder of Rituximab infusion.  Pt and patient's daughter made aware.  Pharmacy made aware.   1636: Pt stable and all symptoms resolved. B/P 98/51, HR 96, O2 98% on 4 liter [place], Temp 99.1. Pt denies any concerns. Per Dr. Janese Banks okay to discharge home.  Pt stable at this time.

## 2022-11-18 ENCOUNTER — Inpatient Hospital Stay: Payer: 59

## 2022-11-18 ENCOUNTER — Telehealth: Payer: Self-pay | Admitting: *Deleted

## 2022-11-18 ENCOUNTER — Other Ambulatory Visit: Payer: Self-pay | Admitting: Oncology

## 2022-11-18 ENCOUNTER — Other Ambulatory Visit: Payer: Medicaid Other

## 2022-11-18 VITALS — BP 100/55 | HR 72 | Temp 97.5°F | Resp 16

## 2022-11-18 DIAGNOSIS — R5383 Other fatigue: Secondary | ICD-10-CM | POA: Diagnosis not present

## 2022-11-18 DIAGNOSIS — R0602 Shortness of breath: Secondary | ICD-10-CM | POA: Diagnosis not present

## 2022-11-18 DIAGNOSIS — Z79620 Long term (current) use of immunosuppressive biologic: Secondary | ICD-10-CM | POA: Diagnosis not present

## 2022-11-18 DIAGNOSIS — J9622 Acute and chronic respiratory failure with hypercapnia: Secondary | ICD-10-CM | POA: Diagnosis not present

## 2022-11-18 DIAGNOSIS — I2510 Atherosclerotic heart disease of native coronary artery without angina pectoris: Secondary | ICD-10-CM | POA: Diagnosis not present

## 2022-11-18 DIAGNOSIS — I5033 Acute on chronic diastolic (congestive) heart failure: Secondary | ICD-10-CM | POA: Diagnosis not present

## 2022-11-18 DIAGNOSIS — I509 Heart failure, unspecified: Secondary | ICD-10-CM | POA: Diagnosis not present

## 2022-11-18 DIAGNOSIS — M5136 Other intervertebral disc degeneration, lumbar region: Secondary | ICD-10-CM | POA: Diagnosis not present

## 2022-11-18 DIAGNOSIS — Z9181 History of falling: Secondary | ICD-10-CM | POA: Diagnosis not present

## 2022-11-18 DIAGNOSIS — Z6823 Body mass index (BMI) 23.0-23.9, adult: Secondary | ICD-10-CM | POA: Diagnosis not present

## 2022-11-18 DIAGNOSIS — E039 Hypothyroidism, unspecified: Secondary | ICD-10-CM | POA: Diagnosis not present

## 2022-11-18 DIAGNOSIS — E119 Type 2 diabetes mellitus without complications: Secondary | ICD-10-CM | POA: Diagnosis not present

## 2022-11-18 DIAGNOSIS — D696 Thrombocytopenia, unspecified: Secondary | ICD-10-CM | POA: Diagnosis not present

## 2022-11-18 DIAGNOSIS — M7989 Other specified soft tissue disorders: Secondary | ICD-10-CM | POA: Diagnosis not present

## 2022-11-18 DIAGNOSIS — J948 Other specified pleural conditions: Secondary | ICD-10-CM | POA: Diagnosis not present

## 2022-11-18 DIAGNOSIS — C8308 Small cell B-cell lymphoma, lymph nodes of multiple sites: Secondary | ICD-10-CM | POA: Diagnosis not present

## 2022-11-18 DIAGNOSIS — Z794 Long term (current) use of insulin: Secondary | ICD-10-CM | POA: Diagnosis not present

## 2022-11-18 DIAGNOSIS — I959 Hypotension, unspecified: Secondary | ICD-10-CM | POA: Diagnosis not present

## 2022-11-18 DIAGNOSIS — A419 Sepsis, unspecified organism: Secondary | ICD-10-CM | POA: Diagnosis not present

## 2022-11-18 DIAGNOSIS — D649 Anemia, unspecified: Secondary | ICD-10-CM | POA: Diagnosis not present

## 2022-11-18 DIAGNOSIS — C8300 Small cell B-cell lymphoma, unspecified site: Secondary | ICD-10-CM

## 2022-11-18 DIAGNOSIS — M4856XA Collapsed vertebra, not elsewhere classified, lumbar region, initial encounter for fracture: Secondary | ICD-10-CM | POA: Diagnosis not present

## 2022-11-18 DIAGNOSIS — J441 Chronic obstructive pulmonary disease with (acute) exacerbation: Secondary | ICD-10-CM | POA: Diagnosis not present

## 2022-11-18 DIAGNOSIS — R161 Splenomegaly, not elsewhere classified: Secondary | ICD-10-CM | POA: Diagnosis not present

## 2022-11-18 DIAGNOSIS — Z7961 Long term (current) use of immunomodulator: Secondary | ICD-10-CM | POA: Diagnosis not present

## 2022-11-18 DIAGNOSIS — C884 Extranodal marginal zone B-cell lymphoma of mucosa-associated lymphoid tissue [MALT-lymphoma]: Secondary | ICD-10-CM | POA: Diagnosis not present

## 2022-11-18 DIAGNOSIS — K8020 Calculus of gallbladder without cholecystitis without obstruction: Secondary | ICD-10-CM | POA: Diagnosis not present

## 2022-11-18 DIAGNOSIS — J9621 Acute and chronic respiratory failure with hypoxia: Secondary | ICD-10-CM | POA: Diagnosis not present

## 2022-11-18 DIAGNOSIS — Z87891 Personal history of nicotine dependence: Secondary | ICD-10-CM | POA: Diagnosis not present

## 2022-11-18 DIAGNOSIS — D61818 Other pancytopenia: Secondary | ICD-10-CM | POA: Diagnosis not present

## 2022-11-18 DIAGNOSIS — J90 Pleural effusion, not elsewhere classified: Secondary | ICD-10-CM | POA: Diagnosis not present

## 2022-11-18 DIAGNOSIS — D509 Iron deficiency anemia, unspecified: Secondary | ICD-10-CM | POA: Diagnosis not present

## 2022-11-18 DIAGNOSIS — J449 Chronic obstructive pulmonary disease, unspecified: Secondary | ICD-10-CM | POA: Diagnosis not present

## 2022-11-18 DIAGNOSIS — K922 Gastrointestinal hemorrhage, unspecified: Secondary | ICD-10-CM | POA: Diagnosis not present

## 2022-11-18 DIAGNOSIS — M47814 Spondylosis without myelopathy or radiculopathy, thoracic region: Secondary | ICD-10-CM | POA: Diagnosis not present

## 2022-11-18 DIAGNOSIS — Z79899 Other long term (current) drug therapy: Secondary | ICD-10-CM | POA: Diagnosis not present

## 2022-11-18 MED ORDER — SODIUM CHLORIDE 0.9 % IV SOLN
300.0000 mg | Freq: Once | INTRAVENOUS | Status: AC
  Administered 2022-11-18: 300 mg via INTRAVENOUS
  Filled 2022-11-18: qty 30

## 2022-11-18 MED ORDER — SODIUM CHLORIDE 0.9 % IV SOLN
Freq: Once | INTRAVENOUS | Status: AC
  Filled 2022-11-18: qty 250

## 2022-11-18 MED ORDER — HEPARIN SOD (PORK) LOCK FLUSH 100 UNIT/ML IV SOLN
500.0000 [IU] | Freq: Once | INTRAVENOUS | Status: AC | PRN
  Administered 2022-11-18: 500 [IU]
  Filled 2022-11-18: qty 5

## 2022-11-18 MED ORDER — SODIUM CHLORIDE 0.9 % IV SOLN
10.0000 mg | Freq: Once | INTRAVENOUS | Status: AC
  Administered 2022-11-18: 10 mg via INTRAVENOUS
  Filled 2022-11-18: qty 1

## 2022-11-18 MED ORDER — ACETAMINOPHEN 325 MG PO TABS
650.0000 mg | ORAL_TABLET | Freq: Once | ORAL | Status: AC
  Administered 2022-11-18: 650 mg via ORAL
  Filled 2022-11-18: qty 2

## 2022-11-18 MED ORDER — SODIUM CHLORIDE 0.9% FLUSH
10.0000 mL | INTRAVENOUS | Status: DC | PRN
  Administered 2022-11-18: 10 mL
  Filled 2022-11-18: qty 10

## 2022-11-18 MED ORDER — DIPHENHYDRAMINE HCL 25 MG PO CAPS
50.0000 mg | ORAL_CAPSULE | Freq: Once | ORAL | Status: AC
  Administered 2022-11-18: 50 mg via ORAL
  Filled 2022-11-18: qty 2

## 2022-11-18 NOTE — Progress Notes (Signed)
0822- Per MD, Dr. Janese Banks, order: patient does not need lab work done prior to treatment today; okay to proceed with Rituximab-abbs treatment only today.

## 2022-11-18 NOTE — Patient Instructions (Signed)
[facility]  Discharge Instructions: Thank you for choosing [facility] to provide your oncology and hematology care.  If you have a lab appointment with the [facility], please go directly to the [facility] and check in at the registration area.  Wear comfortable clothing and clothing appropriate for easy access to any Portacath or PICC line.   We strive to give you quality time with your provider. You may need to reschedule your appointment if you arrive late (15 or more minutes).  Arriving late affects you and other patients whose appointments are after yours.  Also, if you miss three or more appointments without notifying the office, you may be dismissed from the clinic at the provider's discretion.      For prescription refill requests, have your pharmacy contact our office and allow 72 hours for refills to be completed.     To help prevent nausea and vomiting after your treatment, we encourage you to take your nausea medication as directed.  BELOW ARE SYMPTOMS THAT SHOULD BE REPORTED IMMEDIATELY: *FEVER GREATER THAN 100.4 F (38 C) OR HIGHER *CHILLS OR SWEATING *NAUSEA AND VOMITING THAT IS NOT CONTROLLED WITH YOUR NAUSEA MEDICATION *UNUSUAL SHORTNESS OF BREATH *UNUSUAL BRUISING OR BLEEDING *URINARY PROBLEMS (pain or burning when urinating, or frequent urination) *BOWEL PROBLEMS (unusual diarrhea, constipation, pain near the anus) TENDERNESS IN MOUTH AND THROAT WITH OR WITHOUT PRESENCE OF ULCERS (sore throat, sores in mouth, or a toothache) UNUSUAL RASH, SWELLING OR PAIN  UNUSUAL VAGINAL DISCHARGE OR ITCHING   Items with * indicate a potential emergency and should be followed up as soon as possible or go to the Emergency Department if any problems should occur.  Please show the CHEMOTHERAPY ALERT CARD or IMMUNOTHERAPY ALERT CARD at check-in to the Emergency Department and triage nurse.  Should you have questions after your visit  or need to cancel or reschedule your appointment, please contact [facility]  [phone] and follow the prompts.  Office hours are 8:00 a.m. to 4:30 p.m. Monday - Friday. Please note that voicemails left after 4:00 p.m. may not be returned until the following business day.  We are closed weekends and major holidays. You have access to a nurse at all times for urgent questions. Please call the main number to the clinic [phone] and follow the prompts.  For any non-urgent questions, you may also contact your provider using MyChart. We now offer e-Visits for anyone 59 and older to request care online for non-urgent symptoms. For details visit [email or web address].   Also download the MyChart app! Go to the app store, search "MyChart", open the app, select [facility], and log in with your MyChart username and password.

## 2022-11-18 NOTE — Telephone Encounter (Signed)
Natasha [name] had called and I am suppose to speak to Natasha [name]. I called her and she said that she did ask  for Natasha [name] to set up transportation and there not enough time to get medicaid to bring her. Natasha [name] said he can call EMS and I said no. I called Natasha [name] and said that it is ok to do the [place] transport for tom. I spoke to pt about it and she will need to be ready between 7:45 to 8 am. I also called Natasha [name] to let him know this also. I told him that she will get on van and get her transfusion and when she is done then the [facility] will bring her back. He understands.

## 2022-11-19 ENCOUNTER — Inpatient Hospital Stay: Payer: 59

## 2022-11-19 DIAGNOSIS — I2510 Atherosclerotic heart disease of native coronary artery without angina pectoris: Secondary | ICD-10-CM | POA: Diagnosis not present

## 2022-11-19 DIAGNOSIS — Z7961 Long term (current) use of immunomodulator: Secondary | ICD-10-CM | POA: Diagnosis not present

## 2022-11-19 DIAGNOSIS — R5383 Other fatigue: Secondary | ICD-10-CM | POA: Diagnosis not present

## 2022-11-19 DIAGNOSIS — M5136 Other intervertebral disc degeneration, lumbar region: Secondary | ICD-10-CM | POA: Diagnosis not present

## 2022-11-19 DIAGNOSIS — J90 Pleural effusion, not elsewhere classified: Secondary | ICD-10-CM | POA: Diagnosis not present

## 2022-11-19 DIAGNOSIS — Z87891 Personal history of nicotine dependence: Secondary | ICD-10-CM | POA: Diagnosis not present

## 2022-11-19 DIAGNOSIS — D649 Anemia, unspecified: Secondary | ICD-10-CM | POA: Diagnosis not present

## 2022-11-19 DIAGNOSIS — K922 Gastrointestinal hemorrhage, unspecified: Secondary | ICD-10-CM | POA: Diagnosis not present

## 2022-11-19 DIAGNOSIS — M47814 Spondylosis without myelopathy or radiculopathy, thoracic region: Secondary | ICD-10-CM | POA: Diagnosis not present

## 2022-11-19 DIAGNOSIS — I509 Heart failure, unspecified: Secondary | ICD-10-CM | POA: Diagnosis not present

## 2022-11-19 DIAGNOSIS — R0602 Shortness of breath: Secondary | ICD-10-CM | POA: Diagnosis not present

## 2022-11-19 DIAGNOSIS — J449 Chronic obstructive pulmonary disease, unspecified: Secondary | ICD-10-CM | POA: Diagnosis not present

## 2022-11-19 DIAGNOSIS — J948 Other specified pleural conditions: Secondary | ICD-10-CM | POA: Diagnosis not present

## 2022-11-19 DIAGNOSIS — Z79620 Long term (current) use of immunosuppressive biologic: Secondary | ICD-10-CM | POA: Diagnosis not present

## 2022-11-19 DIAGNOSIS — E119 Type 2 diabetes mellitus without complications: Secondary | ICD-10-CM | POA: Diagnosis not present

## 2022-11-19 DIAGNOSIS — K8020 Calculus of gallbladder without cholecystitis without obstruction: Secondary | ICD-10-CM | POA: Diagnosis not present

## 2022-11-19 DIAGNOSIS — Z79899 Other long term (current) drug therapy: Secondary | ICD-10-CM | POA: Diagnosis not present

## 2022-11-19 DIAGNOSIS — R161 Splenomegaly, not elsewhere classified: Secondary | ICD-10-CM | POA: Diagnosis not present

## 2022-11-19 DIAGNOSIS — C8590 Non-Hodgkin lymphoma, unspecified, unspecified site: Secondary | ICD-10-CM

## 2022-11-19 DIAGNOSIS — C8308 Small cell B-cell lymphoma, lymph nodes of multiple sites: Secondary | ICD-10-CM | POA: Diagnosis not present

## 2022-11-19 DIAGNOSIS — M4856XA Collapsed vertebra, not elsewhere classified, lumbar region, initial encounter for fracture: Secondary | ICD-10-CM | POA: Diagnosis not present

## 2022-11-19 DIAGNOSIS — I959 Hypotension, unspecified: Secondary | ICD-10-CM | POA: Diagnosis not present

## 2022-11-19 DIAGNOSIS — M7989 Other specified soft tissue disorders: Secondary | ICD-10-CM | POA: Diagnosis not present

## 2022-11-19 MED ORDER — HEPARIN SOD (PORK) LOCK FLUSH 100 UNIT/ML IV SOLN
250.0000 [IU] | INTRAVENOUS | Status: AC | PRN
  Administered 2022-11-19: 500 [IU]
  Filled 2022-11-19: qty 5

## 2022-11-19 MED ORDER — SODIUM CHLORIDE 0.9% IV SOLUTION
250.0000 mL | Freq: Once | INTRAVENOUS | Status: AC
  Administered 2022-11-19: 250 mL via INTRAVENOUS
  Filled 2022-11-19: qty 250

## 2022-11-19 MED ORDER — ACETAMINOPHEN 325 MG PO TABS
650.0000 mg | ORAL_TABLET | Freq: Once | ORAL | Status: AC
  Administered 2022-11-19: 650 mg via ORAL
  Filled 2022-11-19: qty 2

## 2022-11-19 NOTE — Patient Instructions (Signed)

## 2022-11-20 DIAGNOSIS — N1831 Chronic kidney disease, stage 3a: Secondary | ICD-10-CM | POA: Diagnosis not present

## 2022-11-20 DIAGNOSIS — I5033 Acute on chronic diastolic (congestive) heart failure: Secondary | ICD-10-CM | POA: Diagnosis not present

## 2022-11-20 LAB — BPAM RBC
Blood Product Expiration Date: 202402062359
ISSUE DATE / TIME: 202401260901
Unit Type and Rh: 9500

## 2022-11-20 LAB — TYPE AND SCREEN
ABO/RH(D): O POS
Antibody Screen: NEGATIVE
Unit division: 0

## 2022-11-22 ENCOUNTER — Other Ambulatory Visit: Payer: 59

## 2022-11-22 VITALS — HR 87 | Temp 97.5°F

## 2022-11-22 DIAGNOSIS — Z515 Encounter for palliative care: Secondary | ICD-10-CM

## 2022-11-22 NOTE — Progress Notes (Signed)
PATIENT NAME: Natasha [name] DOB: 1946/06/14 MRN: [id number]  PRIMARY CARE PROVIDER: [name], NP  RESPONSIBLE PARTY:  Acct ID - Guarantor Home Phone Work Phone Relationship Acct Type  [id number] [name], [name] [phone]  Self P/F     [address] [place], [place] [address]   Home visit completed with patient.  She is currently without an home assistance but her roommate will be back home this afternoon.  Stool present on her bed and clothing.  Blood and stool noted on her privacy sheet by her bedside commode.  Bedside commode full with urine and stool.  I asked patient if someone would be able to assist with cleaning up and emptying her bedside commode and she advised her roommate would help once he returns today.  I offered to assist patient with any needs but she has declined.   Home Health:  Currently has [facility] in place.  Per patient, they are working to get a home health aide in place.  She has a nurse that sees her weekly and is assisting with lower extremity edema.  She is uncertain if PT has started.  Meals on Wheels:  Not currently in place but interested.  I will notify [place], SW of request.   Palliative Care vs Hospice:  Discussed both programs and patient advised she desires to remain with Palliative Care.  She would like to continue treatments with oncology.   Shortness of breath:  Currently on 4 L via [place].  Patient endorses shortness of breath with ambulation.  She also has a productive cough of grey tinged sputum.   Pain:  Bilateral lower extremities due to blisters.  Patient awaiting home health to assist with this.      CODE STATUS: Full ADVANCED DIRECTIVES: No MOST FORM: No PPS: 50%   PHYSICAL EXAM:   VITALS: Today's Vitals   11/22/22 1124  Pulse: 87  Temp: (!) 97.5 F (36.4 C)  SpO2: 99%          Lorenza Burton, RN

## 2022-11-23 ENCOUNTER — Encounter: Payer: Self-pay | Admitting: *Deleted

## 2022-11-23 ENCOUNTER — Telehealth: Payer: Self-pay

## 2022-11-23 MED FILL — Dexamethasone Sodium Phosphate Inj 100 MG/10ML: INTRAMUSCULAR | Qty: 1 | Status: AC

## 2022-11-23 NOTE — Patient Outreach (Signed)
  Care Coordination   11/23/2022 Name: Natasha [name] MRN: [id number] DOB: Feb 27, 1946   Care Coordination Outreach Attempts:  An unsuccessful telephone outreach was attempted for a scheduled appointment today. HIPAA compliant voice mail left with call back phone number and return call request  Follow Up Plan:  Additional outreach attempts will be made to offer the patient care coordination information and services.   Encounter Outcome:  No Answer   Care Coordination Interventions:  No, not indicated    Quinn Plowman [facility] [phone] direct line

## 2022-11-23 NOTE — Progress Notes (Signed)
Meals on wheels referral made to [facility].

## 2022-11-24 ENCOUNTER — Other Ambulatory Visit: Payer: Self-pay

## 2022-11-24 ENCOUNTER — Inpatient Hospital Stay: Payer: 59 | Admitting: Oncology

## 2022-11-24 ENCOUNTER — Inpatient Hospital Stay: Payer: 59

## 2022-11-24 ENCOUNTER — Other Ambulatory Visit: Payer: Medicaid Other

## 2022-11-24 ENCOUNTER — Ambulatory Visit: Payer: Medicaid Other

## 2022-11-24 ENCOUNTER — Ambulatory Visit: Payer: Medicaid Other | Admitting: Oncology

## 2022-11-24 ENCOUNTER — Other Ambulatory Visit: Payer: Self-pay | Admitting: Oncology

## 2022-11-24 DIAGNOSIS — C8300 Small cell B-cell lymphoma, unspecified site: Secondary | ICD-10-CM

## 2022-11-24 MED FILL — Dexamethasone Sodium Phosphate Inj 100 MG/10ML: INTRAMUSCULAR | Qty: 1 | Status: AC

## 2022-11-25 ENCOUNTER — Telehealth: Payer: Self-pay | Admitting: Oncology

## 2022-11-25 ENCOUNTER — Inpatient Hospital Stay: Payer: 59 | Admitting: Nurse Practitioner

## 2022-11-25 ENCOUNTER — Inpatient Hospital Stay: Payer: Medicaid Other | Admitting: Nurse Practitioner

## 2022-11-25 ENCOUNTER — Inpatient Hospital Stay: Payer: 59

## 2022-11-25 ENCOUNTER — Telehealth: Payer: Self-pay

## 2022-11-25 NOTE — Telephone Encounter (Signed)
Note to provider.

## 2022-11-25 NOTE — Telephone Encounter (Signed)
Attempt made to reach patient in regards to missed appointment this morning. Direct ext left for her to return call.

## 2022-11-26 ENCOUNTER — Telehealth: Payer: Self-pay | Admitting: *Deleted

## 2022-11-26 ENCOUNTER — Inpatient Hospital Stay: Payer: 59

## 2022-11-26 ENCOUNTER — Other Ambulatory Visit: Payer: Self-pay

## 2022-11-26 ENCOUNTER — Inpatient Hospital Stay: Payer: Medicaid Other | Admitting: Nurse Practitioner

## 2022-11-26 ENCOUNTER — Encounter: Payer: Self-pay | Admitting: *Deleted

## 2022-11-26 ENCOUNTER — Other Ambulatory Visit: Payer: Self-pay | Admitting: Pharmacist

## 2022-11-26 ENCOUNTER — Other Ambulatory Visit (HOSPITAL_COMMUNITY): Payer: Self-pay

## 2022-11-26 DIAGNOSIS — C8300 Small cell B-cell lymphoma, unspecified site: Secondary | ICD-10-CM

## 2022-11-26 MED ORDER — VEMLIDY 25 MG PO TABS
25.0000 mg | ORAL_TABLET | Freq: Every day | ORAL | 3 refills | Status: DC
  Filled 2022-11-26 (×2): qty 30, 30d supply, fill #0
  Filled 2023-01-19: qty 30, 30d supply, fill #1

## 2022-11-26 NOTE — Progress Notes (Signed)
Received notification from [facility] (Specialty) that patient would like the fill their Vemlidy there because they were having issues at their local pharmacy. Rx redierced to [facility] (Specialty).

## 2022-11-26 NOTE — Patient Outreach (Signed)
  Care Coordination   11/26/2022 Name: Natasha [name] MRN: [id number] DOB: 1946-10-17   Care Coordination Outreach Attempts:  An unsuccessful telephone outreach was attempted for a scheduled appointment today.  Follow Up Plan:  Additional outreach attempts will be made to offer the patient care coordination information and services.   Encounter Outcome:  No Answer   Care Coordination Interventions:  No, not indicated    Genieve Ramaswamy, [place] Worker  [facility] Care Management [phone]

## 2022-11-29 ENCOUNTER — Other Ambulatory Visit (HOSPITAL_COMMUNITY): Payer: Self-pay

## 2022-11-29 ENCOUNTER — Other Ambulatory Visit: Payer: Self-pay

## 2022-11-30 ENCOUNTER — Other Ambulatory Visit: Payer: Self-pay

## 2022-12-01 ENCOUNTER — Ambulatory Visit
Admission: RE | Admit: 2022-12-01 | Discharge: 2022-12-01 | Disposition: A | Discharge status: Home / Self Care | Payer: 59 | Source: Ambulatory Visit | Attending: Oncology | Admitting: Oncology

## 2022-12-01 ENCOUNTER — Ambulatory Visit: Payer: Medicaid Other

## 2022-12-01 ENCOUNTER — Other Ambulatory Visit: Payer: Self-pay | Admitting: Oncology

## 2022-12-01 ENCOUNTER — Other Ambulatory Visit: Payer: Medicaid Other

## 2022-12-01 ENCOUNTER — Inpatient Hospital Stay: Payer: 59 | Attending: Oncology | Admitting: Oncology

## 2022-12-01 ENCOUNTER — Inpatient Hospital Stay: Payer: 59

## 2022-12-01 ENCOUNTER — Encounter: Payer: Self-pay | Admitting: Oncology

## 2022-12-01 ENCOUNTER — Ambulatory Visit: Payer: Medicaid Other | Admitting: Oncology

## 2022-12-01 VITALS — BP 106/55 | HR 86 | Temp 98.2°F | Resp 17 | Wt 169.8 lb

## 2022-12-01 VITALS — BP 106/52 | HR 92 | Temp 98.0°F | Resp 20 | Wt 169.8 lb

## 2022-12-01 DIAGNOSIS — R0602 Shortness of breath: Secondary | ICD-10-CM | POA: Diagnosis not present

## 2022-12-01 DIAGNOSIS — R5383 Other fatigue: Secondary | ICD-10-CM | POA: Diagnosis not present

## 2022-12-01 DIAGNOSIS — Z5181 Encounter for therapeutic drug level monitoring: Secondary | ICD-10-CM | POA: Diagnosis not present

## 2022-12-01 DIAGNOSIS — Z801 Family history of malignant neoplasm of trachea, bronchus and lung: Secondary | ICD-10-CM | POA: Insufficient documentation

## 2022-12-01 DIAGNOSIS — Z794 Long term (current) use of insulin: Secondary | ICD-10-CM | POA: Diagnosis not present

## 2022-12-01 DIAGNOSIS — Z5112 Encounter for antineoplastic immunotherapy: Secondary | ICD-10-CM | POA: Insufficient documentation

## 2022-12-01 DIAGNOSIS — Z9889 Other specified postprocedural states: Secondary | ICD-10-CM | POA: Insufficient documentation

## 2022-12-01 DIAGNOSIS — J90 Pleural effusion, not elsewhere classified: Secondary | ICD-10-CM | POA: Insufficient documentation

## 2022-12-01 DIAGNOSIS — K922 Gastrointestinal hemorrhage, unspecified: Secondary | ICD-10-CM | POA: Insufficient documentation

## 2022-12-01 DIAGNOSIS — C8300 Small cell B-cell lymphoma, unspecified site: Secondary | ICD-10-CM | POA: Diagnosis not present

## 2022-12-01 DIAGNOSIS — D61818 Other pancytopenia: Secondary | ICD-10-CM | POA: Diagnosis not present

## 2022-12-01 DIAGNOSIS — N644 Mastodynia: Secondary | ICD-10-CM | POA: Insufficient documentation

## 2022-12-01 DIAGNOSIS — F0390 Unspecified dementia without behavioral disturbance: Secondary | ICD-10-CM | POA: Insufficient documentation

## 2022-12-01 DIAGNOSIS — Z79620 Long term (current) use of immunosuppressive biologic: Secondary | ICD-10-CM | POA: Insufficient documentation

## 2022-12-01 DIAGNOSIS — Z79899 Other long term (current) drug therapy: Secondary | ICD-10-CM | POA: Insufficient documentation

## 2022-12-01 DIAGNOSIS — D649 Anemia, unspecified: Secondary | ICD-10-CM | POA: Insufficient documentation

## 2022-12-01 DIAGNOSIS — C8308 Small cell B-cell lymphoma, lymph nodes of multiple sites: Secondary | ICD-10-CM | POA: Diagnosis not present

## 2022-12-01 DIAGNOSIS — Z818 Family history of other mental and behavioral disorders: Secondary | ICD-10-CM | POA: Insufficient documentation

## 2022-12-01 DIAGNOSIS — E119 Type 2 diabetes mellitus without complications: Secondary | ICD-10-CM | POA: Diagnosis not present

## 2022-12-01 DIAGNOSIS — M47814 Spondylosis without myelopathy or radiculopathy, thoracic region: Secondary | ICD-10-CM | POA: Insufficient documentation

## 2022-12-01 DIAGNOSIS — M5136 Other intervertebral disc degeneration, lumbar region: Secondary | ICD-10-CM | POA: Insufficient documentation

## 2022-12-01 DIAGNOSIS — Z87891 Personal history of nicotine dependence: Secondary | ICD-10-CM | POA: Insufficient documentation

## 2022-12-01 DIAGNOSIS — R590 Localized enlarged lymph nodes: Secondary | ICD-10-CM | POA: Insufficient documentation

## 2022-12-01 DIAGNOSIS — Z8249 Family history of ischemic heart disease and other diseases of the circulatory system: Secondary | ICD-10-CM | POA: Insufficient documentation

## 2022-12-01 DIAGNOSIS — J449 Chronic obstructive pulmonary disease, unspecified: Secondary | ICD-10-CM | POA: Insufficient documentation

## 2022-12-01 DIAGNOSIS — M4856XA Collapsed vertebra, not elsewhere classified, lumbar region, initial encounter for fracture: Secondary | ICD-10-CM | POA: Insufficient documentation

## 2022-12-01 DIAGNOSIS — D6959 Other secondary thrombocytopenia: Secondary | ICD-10-CM | POA: Insufficient documentation

## 2022-12-01 DIAGNOSIS — J948 Other specified pleural conditions: Secondary | ICD-10-CM | POA: Diagnosis not present

## 2022-12-01 DIAGNOSIS — M7989 Other specified soft tissue disorders: Secondary | ICD-10-CM | POA: Diagnosis not present

## 2022-12-01 DIAGNOSIS — K8020 Calculus of gallbladder without cholecystitis without obstruction: Secondary | ICD-10-CM | POA: Diagnosis not present

## 2022-12-01 DIAGNOSIS — I2510 Atherosclerotic heart disease of native coronary artery without angina pectoris: Secondary | ICD-10-CM | POA: Diagnosis not present

## 2022-12-01 DIAGNOSIS — I509 Heart failure, unspecified: Secondary | ICD-10-CM | POA: Diagnosis not present

## 2022-12-01 DIAGNOSIS — Z803 Family history of malignant neoplasm of breast: Secondary | ICD-10-CM | POA: Insufficient documentation

## 2022-12-01 LAB — CBC WITH DIFFERENTIAL/PLATELET
Abs Immature Granulocytes: 0.04 10*3/uL (ref 0.00–0.07)
Basophils Absolute: 0 10*3/uL (ref 0.0–0.1)
Basophils Relative: 1 %
Eosinophils Absolute: 0 10*3/uL (ref 0.0–0.5)
Eosinophils Relative: 2 %
HCT: 25.5 % — ABNORMAL LOW (ref 36.0–46.0)
Hemoglobin: 7.4 g/dL — ABNORMAL LOW (ref 12.0–15.0)
Immature Granulocytes: 3 %
Lymphocytes Relative: 64 %
Lymphs Abs: 0.9 10*3/uL (ref 0.7–4.0)
MCH: 28.6 pg (ref 26.0–34.0)
MCHC: 29 g/dL — ABNORMAL LOW (ref 30.0–36.0)
MCV: 98.5 fL (ref 80.0–100.0)
Monocytes Absolute: 0.2 10*3/uL (ref 0.1–1.0)
Monocytes Relative: 14 %
Neutro Abs: 0.2 10*3/uL — CL (ref 1.7–7.7)
Neutrophils Relative %: 16 %
Platelets: 140 10*3/uL — ABNORMAL LOW (ref 150–400)
RBC: 2.59 MIL/uL — ABNORMAL LOW (ref 3.87–5.11)
RDW: 21.1 % — ABNORMAL HIGH (ref 11.5–15.5)
WBC: 1.4 10*3/uL — CL (ref 4.0–10.5)
nRBC: 1.4 % — ABNORMAL HIGH (ref 0.0–0.2)

## 2022-12-01 LAB — COMPREHENSIVE METABOLIC PANEL
ALT: 17 U/L (ref 0–44)
AST: 22 U/L (ref 15–41)
Albumin: 3 g/dL — ABNORMAL LOW (ref 3.5–5.0)
Alkaline Phosphatase: 167 U/L — ABNORMAL HIGH (ref 38–126)
Anion gap: 6 (ref 5–15)
BUN: 42 mg/dL — ABNORMAL HIGH (ref 8–23)
CO2: 33 mmol/L — ABNORMAL HIGH (ref 22–32)
Calcium: 10.7 mg/dL — ABNORMAL HIGH (ref 8.9–10.3)
Chloride: 102 mmol/L (ref 98–111)
Creatinine, Ser: 0.86 mg/dL (ref 0.44–1.00)
GFR, Estimated: >60 mL/min (ref >60)
Glucose, Bld: 216 mg/dL — ABNORMAL HIGH (ref 70–99)
Potassium: 4.8 mmol/L (ref 3.5–5.1)
Sodium: 141 mmol/L (ref 135–145)
Total Bilirubin: 0.9 mg/dL (ref 0.3–1.2)
Total Protein: 4.9 g/dL — ABNORMAL LOW (ref 6.5–8.1)

## 2022-12-01 MED ORDER — DIPHENHYDRAMINE HCL 25 MG PO CAPS
50.0000 mg | ORAL_CAPSULE | Freq: Once | ORAL | Status: AC
  Administered 2022-12-01: 50 mg via ORAL
  Filled 2022-12-01: qty 2

## 2022-12-01 MED ORDER — SODIUM CHLORIDE 0.9 % IV SOLN
400.0000 mg | Freq: Once | INTRAVENOUS | Status: AC
  Administered 2022-12-01: 400 mg via INTRAVENOUS
  Filled 2022-12-01: qty 40

## 2022-12-01 MED ORDER — SODIUM CHLORIDE 0.9% FLUSH
10.0000 mL | INTRAVENOUS | Status: DC | PRN
  Administered 2022-12-01: 10 mL
  Filled 2022-12-01: qty 10

## 2022-12-01 MED ORDER — HEPARIN SOD (PORK) LOCK FLUSH 100 UNIT/ML IV SOLN
500.0000 [IU] | Freq: Once | INTRAVENOUS | Status: AC | PRN
  Administered 2022-12-01: 500 [IU]
  Filled 2022-12-01: qty 5

## 2022-12-01 MED ORDER — SODIUM CHLORIDE 0.9 % IV SOLN
10.0000 mg | Freq: Once | INTRAVENOUS | Status: AC
  Administered 2022-12-01: 10 mg via INTRAVENOUS
  Filled 2022-12-01: qty 10

## 2022-12-01 MED ORDER — ACETAMINOPHEN 325 MG PO TABS
650.0000 mg | ORAL_TABLET | Freq: Once | ORAL | Status: AC
  Administered 2022-12-01: 650 mg via ORAL
  Filled 2022-12-01: qty 2

## 2022-12-01 MED ORDER — SODIUM CHLORIDE 0.9 % IV SOLN
Freq: Once | INTRAVENOUS | Status: AC
  Filled 2022-12-01: qty 250

## 2022-12-01 MED FILL — Dexamethasone Sodium Phosphate Inj 100 MG/10ML: INTRAMUSCULAR | Qty: 1 | Status: AC

## 2022-12-01 NOTE — Progress Notes (Signed)
Patient here for oncology follow-up appointment, concerns of oozing blister on leg/foot and frequent urination

## 2022-12-01 NOTE — Progress Notes (Signed)
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  Her most recent CBC from 06/22/2022 showed white count of 4.3, H&H of 8/26.6 with an MCV of 93 and a platelet count of 77.  Looking back at her prior CBCs her hemoglobin was normal at 13.3 until November 2020 and since June 2023 her hemoglobin has been fluctuating between 8.5-9.5.  Platelets were normal up until November 2020 but fluctuating between 70-80 since June 2023.  Her hemoglobin was normal at 13 in May 2021 as well and we do not have any labs between 2021 and 2023.  Patient states that she was diagnosed with a possible lymphoma a few years ago in [place] but was told that she does not require any treatment   Patient had a CT chest abdomen and pelvis with contrast which showed bulky bilateral axillary mediastinal and hilar adenopathy with the largest axillary node measuring 3.3 x 2.5 cm as compared to 1.7 cm back in 2020.  Severe splenomegaly of 21.9 cm.  Bulky intra-abdominal adenopathy.  Moderate right pleural effusion   Flow cytometry from peripheral blood that was done at [facility] showed clonal B cells which was CD5 negative CD10 negative CD103 negative and CD123 negative.  Differential diagnosis includes marginal zone lymphoma, lymphoplasmacytic lymphoma, DLBCL and atypical CLL.  Blood smear  shows a small to medium lymphoid cells with irregular nuclear contours containing moderately condensed chromatin.  Large abnormal lymphocytes are rare.  Taken together these morphological and immunophenotypic findings favor the diagnosis of marginal zone lymphoma.   Patient had pleural fluid tapped as well and cytology was again consistent with involvement with patient's known B-cell lymphoma consistent with peripheral blood flow cytometry pathology.   Plan was to offer Bendamustine Rituxan chemotherapy for 6 cycles.  However patient had repeated episodes of hospitalization for respiratory failure and worsening performance status.  Plan was therefore made to proceed with acalabrutinib instead.  She started taking that on 08/31/2022   No significant response to acalabrutinib after 3 months of therapy.  Also had a major GI bleed.  Plan is to switch her to Rituxan and Revlimid with weekly Rituxan for 4 cycles followed by Rituxan Revlimid starting cycle 2 q. 28 days    Interval history-patient missed her treatment last week due to transportation issues.  She was late for her treatment today as well.  No new complaints as compared to 2 weeks ago.  No recent hospitalizations.  She has baseline fatigue and exertional shortness of breath.  Appetite is good.  She has chronic bilateral lower extremity swelling.  ECOG PS- 2 Pain scale- 2 Opioid associated constipation- no  Review of systems- Review of Systems  Constitutional:  Positive for malaise/fatigue. Negative for chills, fever and weight loss.  HENT:  Negative for congestion, ear discharge and nosebleeds.   Eyes:  Negative for blurred vision.  Respiratory:  Positive for shortness  of breath. Negative for cough, hemoptysis, sputum production and wheezing.   Cardiovascular:  Positive for leg swelling. Negative for chest pain, palpitations, orthopnea and claudication.  Gastrointestinal:  Negative for abdominal pain, blood in stool, constipation, diarrhea,  heartburn, melena, nausea and vomiting.  Genitourinary:  Negative for dysuria, flank pain, frequency, hematuria and urgency.  Musculoskeletal:  Negative for back pain, joint pain and myalgias.  Skin:  Negative for rash.  Neurological:  Negative for dizziness, tingling, focal weakness, seizures, weakness and headaches.  Endo/Heme/Allergies:  Does not bruise/bleed easily.  Psychiatric/Behavioral:  Negative for depression and suicidal ideas. The patient does not have insomnia.       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   CHF (congestive heart failure) ([facility])    COPD (chronic obstructive pulmonary disease) (H[facility])    Diabetes mellitus without complication (H[facility])    Hypotension    Pleural effusion    ARMc 82m,  2 weeks ago   Vaginal delivery    x 5     Past Surgical History:  Procedure Laterality Date   IR IMAGING GUIDED PORT INSERTION  08/16/2022   TONSILLECTOMY Bilateral    as a child    Social History   Socioeconomic History   Marital status: Widowed    Spouse name: Not on file   Number of children: 2   Years of education: Not on file   Highest education level: 9th grade  Occupational History   Occupation: unemployed  Tobacco Use   Smoking status: Former    Packs/day: 2.00    Years: 55.00    Total pack years: 110.00    Types: Cigarettes    Start date: 07/07/1961    Quit date: 08/02/2022    Years since quitting: 0.3   Smokeless tobacco: Never   Tobacco comments:    1/2 pack she states but family says 2 PPD  Vaping Use   Vaping Use: Never used  Substance and Sexual Activity   Alcohol use: No   Drug use: Never   Sexual activity: Not Currently    Partners: Male    Birth control/protection: Post-menopausal  Other Topics Concern   Not on file  Social History Narrative   08/14/20   [place]  Living: to be near daughter   Work: retired      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      Enjoys: stays at home      Exercise: walking to her daughter's store   Diet: eats fruit, air fried chicken      Safety   Seat belts: Yes    Guns: No   Safe in relationships: Yes    Social Determinants of Health   Financial Resource Strain: Medium Risk (08/24/2022)   Overall Financial Resource Strain (CARDIA)    Difficulty of Paying Living Expenses: Somewhat hard  Food Insecurity: No Food Insecurity (11/08/2022)   Hunger Vital Sign    Worried About Running Out of Food in the Last Year: Never true    Ran Out of Food in the Last Year: Never true  Recent Concern: Food Insecurity - Food Insecurity Present (08/30/2022)   Hunger Vital Sign    Worried About Running Out of Food in the Last Year: Sometimes true    Ran Out of Food  in the Last Year: Sometimes true  Transportation Needs: No Transportation Needs (11/08/2022)   PRAPARE - Hydrologist (Medical): No    Lack of Transportation (Non-Medical): No  Recent Concern: Transportation Needs - Unmet Transportation Needs (08/30/2022)   PRAPARE - Hydrologist (Medical): Yes    Lack of Transportation (Non-Medical): No  Physical Activity: Inactive (08/24/2022)   Exercise Vital Sign    Days of Exercise per Week: 0 days    Minutes of Exercise per Session: 0 min  Stress: Stress Concern Present (08/24/2022)   [place]    Feeling of Stress : To some extent  Social Connections: Socially Isolated (08/24/2022)   Social Connection and Isolation Panel [NHANES]    Frequency of Communication with Friends and Family: More than three times a week    Frequency of Social Gatherings with Friends and Family: More than three times a week    Attends Religious Services: Never    Marine scientist or Organizations: No    Attends Theatre manager Meetings: Never    Marital Status: Widowed  Intimate Partner Violence: Not At Risk (11/08/2022)   Humiliation, Afraid, Rape, and Kick questionnaire    Fear of Current or Ex-Partner: No    Emotionally Abused: No    Physically Abused: No    Sexually Abused: No    Family History  Problem Relation Age of Onset   Breast cancer Mother    Alzheimer's disease Father    Lung cancer Father        lung   Varicose Veins Brother    Heart attack Brother      Current Outpatient Medications:    albuterol (VENTOLIN HFA) 108 (90 Base) MCG/ACT inhaler, TAKE 2 PUFFS BY MOUTH EVERY 6 HOURS AS NEEDED FOR WHEEZE OR SHORTNESS OF BREATH (Patient taking differently: Inhale 2 puffs into the lungs every 6 (six) hours as needed for wheezing or shortness of breath. TAKE 2 PUFFS BY MOUTH EVERY 6 HOURS AS NEEDED FOR WHEEZE OR SHORTNESS OF BREATH), Disp: 18 g, Rfl: 1   amoxicillin-clavulanate (AUGMENTIN) 875-125 MG tablet, Take 1 tablet by mouth 2 (two) times daily., Disp: 60 tablet, Rfl: 1   clobetasol cream (TEMOVATE) 7.61 %, Apply 1 Application topically daily., Disp: , Rfl:    furosemide (LASIX) 20 MG tablet, Take 1 tablet (20 mg total) by mouth daily., Disp: 90 tablet, Rfl: 3   ipratropium-albuterol (DUONEB) 0.5-2.5 (3) MG/3ML SOLN, Take 3 mLs by nebulization every 6 (six) hours as needed (shortness of breath or wheezing)., Disp: , Rfl:    lenalidomide (REVLIMID) 10 MG capsule, Take 1 capsule (10 mg total) by mouth daily. Take for 14 days, then hold for 7 days., Disp: 14 capsule, Rfl: 0   levothyroxine (SYNTHROID) 50 MCG tablet, Take 1 tablet (50 mcg total) by mouth daily at 6 (six) AM., Disp: 30 tablet, Rfl: 0   loperamide (IMODIUM) 2 MG capsule, Take 2 mg by mouth as needed for diarrhea or loose stools., Disp: , Rfl:    midodrine (PROAMATINE) 10 MG tablet, Take 1 tablet (10 mg total) by mouth 3 (three) times daily with meals., Disp: 60 tablet, Rfl: 0   simethicone (MYLICON) 80 MG chewable tablet,  Chew 180 mg by mouth every 6 (six) hours as needed for flatulence. Take 1 capsule PO PRN gas relief., Disp: , Rfl:    Tenofovir Alafenamide Fumarate (VEMLIDY) 25 MG  TABS, Take 1 tablet (25 mg total) by mouth daily., Disp: 30 tablet, Rfl: 3   acyclovir (ZOVIRAX) 400 MG tablet, Take 400 mg by mouth daily., Disp: , Rfl:    insulin NPH-regular Human (70-30) 100 UNIT/ML injection, Inject 4 Units into the skin 2 (two) times daily with a meal. Use only for hyperglycemia, CBG+ 300. Start with 5 units, titrate up to max 10 unit per dose if need. (Patient not taking: Reported on 12/01/2022), Disp: 10 mL, Rfl: 0   ondansetron (ZOFRAN) 8 MG tablet, Take 8 mg by mouth every 8 (eight) hours as needed for nausea, vomiting or refractory nausea / vomiting. Take 1 tablet q6H for 4 days after chemotherapy. (Patient not taking: Reported on 12/01/2022), Disp: , Rfl:    pantoprazole (PROTONIX) 40 MG tablet, Take 1 tablet (40 mg total) by mouth daily. (Patient not taking: Reported on 12/01/2022), Disp: 30 tablet, Rfl: 0   prochlorperazine (COMPAZINE) 10 MG tablet, Take 1 tablet by mouth every 6 (six) hours as needed for nausea or vomiting. Take 1 tab q6H for 4 days after chemotherapy. (Patient not taking: Reported on 12/01/2022), Disp: , Rfl:  No current facility-administered medications for this visit.  Facility-Administered Medications Ordered in Other Visits:    heparin lock flush 100 unit/mL, 500 Units, Intracatheter, Once PRN, Sindy Guadeloupe, MD   sodium chloride flush (NS) 0.9 % injection 10 mL, 10 mL, Intracatheter, PRN, Sindy Guadeloupe, MD  Physical exam:  Vitals:   12/01/22 1012  BP: (!) 106/55  Pulse: 86  Resp: 17  Temp: 98.2 F (36.8 C)  SpO2: 96%  Weight: 169 lb 12.1 oz (77 kg)   Physical Exam Cardiovascular:     Rate and Rhythm: Normal rate and regular rhythm.     Heart sounds: Normal heart sounds.  Pulmonary:     Effort: Pulmonary effort is normal.     Comments: Breath sounds decreased over right lung  base Abdominal:     General: Bowel sounds are normal.     Palpations: Abdomen is soft.  Musculoskeletal:     Comments: Bilateral lower extremity edema.  There are areas of blistering and serous discharge noted from the left lower extremity.  No overt cellulitis  Skin:    General: Skin is warm and dry.  Neurological:     Mental Status: She is alert and oriented to person, place, and time.         Latest Ref Rng & Units 12/01/2022   10:10 AM  CMP  Glucose 70 - 99 mg/dL 216   BUN 8 - 23 mg/dL 42   Creatinine 0.44 - 1.00 mg/dL 0.86   Sodium 135 - 145 mmol/L 141   Potassium 3.5 - 5.1 mmol/L 4.8   Chloride 98 - 111 mmol/L 102   CO2 22 - 32 mmol/L 33   Calcium 8.9 - 10.3 mg/dL 10.7   Total Protein 6.5 - 8.1 g/dL 4.9   Total Bilirubin 0.3 - 1.2 mg/dL 0.9   Alkaline Phos 38 - 126 U/L 167   AST 15 - 41 U/L 22   ALT 0 - 44 U/L 17       Latest Ref Rng & Units 12/01/2022   10:10 AM  CBC  WBC 4.0 - 10.5 K/uL 1.4   Hemoglobin 12.0 - 15.0 g/dL 7.4   Hematocrit 36.0 - 46.0 % 25.5   Platelets 150 - 400 K/uL 140     No images are attached to the encounter.  US THORACENTESIS  ASP PLEURAL SPACE W/IMG GUIDE  Result Date: 11/11/2022 INDICATION: Pleural effusion EXAM: ULTRASOUND GUIDED RIGHT THORACENTESIS MEDICATIONS: 8 cc 1% lidocaine COMPLICATIONS: None immediate. PROCEDURE: An ultrasound guided thoracentesis was thoroughly discussed with the patient and questions answered. The benefits, risks, alternatives and complications were also discussed. The patient understands and wishes to proceed with the procedure. Written consent was obtained. Ultrasound was performed to localize and mark an adequate pocket of fluid in the right chest. The area was then prepped and draped in the normal sterile fashion. 1% Lidocaine was used for local anesthesia. Under ultrasound guidance a 6 Fr Safe-T-Centesis catheter was introduced. Thoracentesis was performed. The catheter was removed and a dressing applied.  FINDINGS: A total of approximately 1.2 L of hazy amber fluid was removed. Samples were sent to the laboratory as requested by the clinical team. IMPRESSION: Successful ultrasound guided right thoracentesis yielding 1.2 L of pleural fluid. Follow-up chest x-ray revealed no evidence of pneumothorax. Read by: Reatha Armour, PA-C Electronically Signed   By: Jerilynn Mages.  Shick M.D.   On: 11/11/2022 07:54   DG Chest Port 1 View  Result Date: 11/10/2022 CLINICAL DATA:  Status post right thoracentesis, pleural effusion. EXAM: PORTABLE CHEST 1 VIEW COMPARISON:  Chest CT 11/07/2022 and chest radiograph from the same date FINDINGS: Power injectable right Port-A-Cath tip: SVC. Moderate bilateral pleural effusions are observed. No visible pneumothorax. Some improvement in aeration in the right mid lung likely reflects reduced pleural fluid related to the thoracentesis. Upper normal heart size. Indistinct pulmonary vasculature, cannot exclude pulmonary venous hypertension and mild edema. Rounded density in the right mid lung compatible with pulmonary nodule (previously shown to be partially calcified). IMPRESSION: 1. No pneumothorax status post right thoracentesis. 2. Moderate bilateral pleural effusions. 3. Indistinct pulmonary vasculature, cannot exclude pulmonary venous hypertension and mild edema. 4. Rounded density in the right mid lung compatible with a pulmonary nodule (previously shown to be partially calcified). 5. Upper normal heart size. Electronically Signed   By: Van Clines M.D.   On: 11/10/2022 16:30   CT CHEST ABDOMEN PELVIS W CONTRAST  Result Date: 11/07/2022 CLINICAL DATA:  Sepsis. Dementia. Lymphoma. Hemorrhagic shock requiring blood transfusions and pressor support. * Tracking Code: BO * EXAM: CT CHEST, ABDOMEN, AND PELVIS WITH CONTRAST TECHNIQUE: Multidetector CT imaging of the chest, abdomen and pelvis was performed following the standard protocol during bolus administration of intravenous contrast.  RADIATION DOSE REDUCTION: This exam was performed according to the departmental dose-optimization program which includes automated exposure control, adjustment of the mA and/or kV according to patient size and/or use of iterative reconstruction technique. CONTRAST:  141m OMNIPAQUE IOHEXOL 300 MG/ML  SOLN COMPARISON:  Chest radiograph from earlier today. 09/19/2022 CT abdomen/pelvis. 07/12/2022 chest CT angiogram. FINDINGS: CT CHEST FINDINGS Cardiovascular: Normal heart size. No significant pericardial effusion/thickening. Left anterior descending coronary atherosclerosis. Right internal jugular Port-A-Cath terminates at the cavoatrial junction. Atherosclerotic nonaneurysmal thoracic aorta. Normal caliber pulmonary arteries. No central pulmonary emboli. Mediastinum/Nodes: No significant thyroid nodules. Unremarkable esophagus. Widespread mild to moderate bilateral axillary, bilateral lower neck, bilateral mediastinal and bilateral hilar lymphadenopathy, overall mildly decreased. Representative 1.6 cm right axillary node (series 3/image 23), decreased from 2.0 cm. Representative 1.7 cm right paratracheal node (series 3/image 19), mildly decreased from 1.9 cm. Representative 1.6 cm AP window node (series 3/image 20), stable. Lungs/Pleura: No pneumothorax. Moderate to large right and small to moderate left dependent pleural effusions with mild smooth bilateral pleural thickening and enhancement. New patchy consolidation in the posterior right upper lobe.  New prominent patchy tree-in-bud opacities throughout the bilateral upper lobes. Calcified 1.9 cm posterior right upper lobe nodule is stable. Moderate compressive atelectasis in the posterior lower lobes bilaterally. Musculoskeletal: No aggressive appearing focal osseous lesions. Mild thoracic spondylosis. Mild anasarca. CT ABDOMEN PELVIS FINDINGS Hepatobiliary: Normal liver with no liver mass. Gallstones within the contracted gallbladder with no definite gallbladder  wall thickening. No biliary ductal dilatation. Pancreas: Normal, with no mass or duct dilation. Spleen: Marked splenomegaly measuring 21.5 cm craniocaudal length, previously 19.7 cm, mildly increased. No splenic masses. Adrenals/Urinary Tract: Normal adrenals. Normal kidneys with no hydronephrosis and no renal mass. Well-positioned Foley catheter within the bladder. Expected minimal gas in the nondependent bladder from instrumentation. Chronic mild diffuse bladder wall thickening. Stomach/Bowel: Normal non-distended stomach. Normal caliber small bowel loops. Appendix not discretely visualized. Mild wall thickening in the nondilated terminal ileum and cecum in the right lower quadrant. No additional sites of small or large bowel wall thickening. No significant diverticulosis. Vascular/Lymphatic: Atherosclerotic nonaneurysmal abdominal aorta. Patent portal, splenic, hepatic and renal veins. Widespread moderate lymphadenopathy throughout the retroperitoneum and bilateral pelvis is overall slightly decreased from 09/19/2022 CT. Representative 2.3 cm portacaval node (series 3/image 63), decreased from 2.5 cm. Representative 1.7 cm left para-aortic node (series 3/image 69), decreased from 2.0 cm. Representative 1.8 cm right inguinal node (series 3/image 111), decreased from 2.0 cm. Reproductive: Grossly normal uterus.  No adnexal mass. Other: No pneumoperitoneum. No focal fluid collection. Moderate anasarca. Musculoskeletal: No aggressive appearing focal osseous lesions. Chronic L2 and L3 vertebral compression fractures. Mild-to-moderate lumbar degenerative disc disease. IMPRESSION: 1. Moderate to large right and small to moderate left dependent pleural effusions with mild smooth bilateral pleural thickening and enhancement. 2. New patchy consolidation in the posterior right upper lobe. New prominent patchy tree-in-bud opacities throughout the bilateral upper lobes. Findings are most compatible with multilobar  bronchopneumonia. 3. Widespread lymphadenopathy in the chest, abdomen and pelvis is overall mildly decreased from prior CT imaging. 4. Marked splenomegaly, mildly increased. 5. Mild wall thickening in the nondilated terminal ileum and cecum in the right lower quadrant, nonspecific, cannot exclude third-spacing, ischemia or infectious enterocolitis. No evidence of bowel obstruction. No pneumatosis. No pneumoperitoneum. 6. Moderate anasarca. 7. Cholelithiasis. 8. Chronic mild diffuse bladder wall thickening, nonspecific. 9.  Aortic Atherosclerosis (ICD10-I70.0). Electronically Signed   By: Ilona Sorrel M.D.   On: 11/07/2022 16:31   DG Chest Port 1 View  Result Date: 11/07/2022 CLINICAL DATA:  Dyspnea, vomiting, rectal bleeding, COPD, lymphoma EXAM: PORTABLE CHEST 1 VIEW COMPARISON:  10/27/2022 chest radiograph. FINDINGS: Stable right internal jugular Port-A-Cath terminating over the cavoatrial junction. Stable cardiomediastinal silhouette with mild cardiomegaly. No pneumothorax. Similar small bilateral pleural effusions. Moderate diffuse prominence of the parahilar interstitial markings with patchy bilateral parahilar and bibasilar lung opacities, worsened. IMPRESSION: 1. Mild cardiomegaly. Moderate diffuse prominence of the parahilar interstitial markings with patchy bilateral parahilar and bibasilar lung opacities, worsened, favor congestive heart failure with pulmonary edema. A component of aspiration or pneumonia at the lung bases is not excluded. 2. Similar small bilateral pleural effusions. Electronically Signed   By: Ilona Sorrel M.D.   On: 11/07/2022 14:06     Assessment and plan- Patient is a 77 y.o. female with history of stage IV nodal marginal zone lymphoma here for on treatment assessment prior to cycle 1 week 2 -day 1 of split dose Rituxan  Patient has chronic leukopenia/neutropenia as well as anemia and thrombocytopenia secondary to bone marrow involvement from nodal marginal zone lymphoma.  Platelets are better today at 140.  Hemoglobin is stable around 7.4.  No need for blood transfusion today.  She will proceed with cycle 1 week 2-day 1 of split dose Rituxan today.  She will get her second dose tomorrow.  I will see her back in 1 week for cycle 1 week 3  Hypercalcemia: Will receive 1 L of IV fluids tomorrow   Visit Diagnosis 1. Nodal marginal zone B-cell lymphoma ([place])   2. Encounter for monitoring rituximab therapy   3. Hypercalcemia      Dr. Randa Evens, MD, MPH [facility] [phone] 12/01/2022 12:22 PM

## 2022-12-01 NOTE — Progress Notes (Signed)
Pt approximately 3 hrs into rituximab infusion at max rate per her admin instructions '150mg'$ /hr at rate of 179m/hr.  Pt sleeping with some increased WOB.  Pt noted to have distended left jugular vein.  Infusion stopped, Dr RJanese Banksto bedside.  Decision made to stop treatment, approximately 25-50cc left of drug.  VSS.  Will send pt for chest xray.  Pt left via wheelchair with daughter to xray.  Dr RJanese Banksnotified.

## 2022-12-01 NOTE — Addendum Note (Signed)
Addended by: Luella Cook on: 12/01/2022 03:21 PM   Modules accepted: Orders

## 2022-12-01 NOTE — Patient Instructions (Signed)
[facility]  Discharge Instructions: Thank you for choosing [facility] to provide your oncology and hematology care.  If you have a lab appointment with the [facility], please go directly to the [facility] and check in at the registration area.  Wear comfortable clothing and clothing appropriate for easy access to any Portacath or PICC line.   We strive to give you quality time with your provider. You may need to reschedule your appointment if you arrive late (15 or more minutes).  Arriving late affects you and other patients whose appointments are after yours.  Also, if you miss three or more appointments without notifying the office, you may be dismissed from the clinic at the provider's discretion.      For prescription refill requests, have your pharmacy contact our office and allow 72 hours for refills to be completed.     To help prevent nausea and vomiting after your treatment, we encourage you to take your nausea medication as directed.  BELOW ARE SYMPTOMS THAT SHOULD BE REPORTED IMMEDIATELY: *FEVER GREATER THAN 100.4 F (38 C) OR HIGHER *CHILLS OR SWEATING *NAUSEA AND VOMITING THAT IS NOT CONTROLLED WITH YOUR NAUSEA MEDICATION *UNUSUAL SHORTNESS OF BREATH *UNUSUAL BRUISING OR BLEEDING *URINARY PROBLEMS (pain or burning when urinating, or frequent urination) *BOWEL PROBLEMS (unusual diarrhea, constipation, pain near the anus) TENDERNESS IN MOUTH AND THROAT WITH OR WITHOUT PRESENCE OF ULCERS (sore throat, sores in mouth, or a toothache) UNUSUAL RASH, SWELLING OR PAIN  UNUSUAL VAGINAL DISCHARGE OR ITCHING   Items with * indicate a potential emergency and should be followed up as soon as possible or go to the Emergency Department if any problems should occur.  Please show the CHEMOTHERAPY ALERT CARD or IMMUNOTHERAPY ALERT CARD at check-in to the Emergency Department and triage nurse.  Should you have questions after your visit  or need to cancel or reschedule your appointment, please contact [facility]  [phone] and follow the prompts.  Office hours are 8:00 a.m. to 4:30 p.m. Monday - Friday. Please note that voicemails left after 4:00 p.m. may not be returned until the following business day.  We are closed weekends and major holidays. You have access to a nurse at all times for urgent questions. Please call the main number to the clinic [phone] and follow the prompts.  For any non-urgent questions, you may also contact your provider using MyChart. We now offer e-Visits for anyone 40 and older to request care online for non-urgent symptoms. For details visit [email or web address].   Also download the MyChart app! Go to the app store, search "MyChart", open the app, select [facility], and log in with your MyChart username and password.

## 2022-12-02 ENCOUNTER — Inpatient Hospital Stay: Payer: Medicaid Other | Admitting: Nurse Practitioner

## 2022-12-02 ENCOUNTER — Telehealth: Payer: Self-pay | Admitting: Oncology

## 2022-12-02 ENCOUNTER — Inpatient Hospital Stay: Payer: 59

## 2022-12-02 NOTE — Telephone Encounter (Signed)
Vm left with patient's daughter. Patient has not shown up to her infusion this morning, is not answering her phone but did leave a message on our answering service at 8am this morning stating that he ride did not show.

## 2022-12-02 NOTE — Telephone Encounter (Signed)
Ariel Belk (Granddaughter) called and got answering service and said the ride for the patient Natasha [name] did not show up to bring her for her Rituxan treatment. All staff involved was notified.

## 2022-12-03 ENCOUNTER — Telehealth: Payer: Self-pay | Admitting: *Deleted

## 2022-12-03 ENCOUNTER — Inpatient Hospital Stay: Payer: Medicaid Other | Admitting: Nurse Practitioner

## 2022-12-03 ENCOUNTER — Other Ambulatory Visit: Payer: Self-pay | Admitting: *Deleted

## 2022-12-03 ENCOUNTER — Telehealth: Payer: Self-pay | Admitting: Nurse Practitioner

## 2022-12-03 DIAGNOSIS — C8300 Small cell B-cell lymphoma, unspecified site: Secondary | ICD-10-CM

## 2022-12-03 DIAGNOSIS — J90 Pleural effusion, not elsewhere classified: Secondary | ICD-10-CM

## 2022-12-03 NOTE — Telephone Encounter (Signed)
I called the daughter [name] and got voicemail and left the message that pt cxr showed that she has fluid on her lungs. The right more than left. Wanted to see if pt/ daughter would like to have the fluid taken off 1 lung at a time. It is outpatient thing and I will need to set it up if they are ok with this. Will wait for decision/ call back.

## 2022-12-03 NOTE — Telephone Encounter (Signed)
The home health order was signed and faxed on November 25, 2022. We can refax it today.

## 2022-12-03 NOTE — Progress Notes (Deleted)
Established Patient Office Visit  Subjective:  Patient ID: Natasha [name], female    DOB: 10-24-1946  Age: 77 y.o. MRN: [id number]  CC: No chief complaint on file.    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   CHF (congestive heart failure) (HCC)    COPD (chronic obstructive pulmonary disease) ([facility])    Diabetes mellitus without complication (HCC)    Hypotension    Pleural effusion    ARMc 872m,  2 weeks ago   Vaginal delivery    x 5    Past Surgical History:  Procedure Laterality Date   IR IMAGING GUIDED PORT INSERTION  08/16/2022   TONSILLECTOMY Bilateral    as a child    Family History  Problem Relation Age of Onset   Breast cancer Mother    Alzheimer's disease Father    Lung cancer Father        lung   Varicose Veins Brother    Heart attack Brother     Social History   Socioeconomic History   Marital status: Widowed    Spouse name: Not on file   Number of children: 2   Years of education: Not on file   Highest education level: 9th grade  Occupational History   Occupation: unemployed  Tobacco Use   Smoking status: Former    Packs/day: 2.00    Years: 55.00    Total pack years: 110.00    Types: Cigarettes    Start date: 07/07/1961    Quit date: 08/02/2022    Years since quitting: 0.3   Smokeless tobacco: Never   Tobacco comments:    1/2 pack she states but family says 2 PPD  Vaping Use   Vaping Use: Never used  Substance and Sexual Activity   Alcohol use: No   Drug use: Never   Sexual activity: Not Currently    Partners: Male    Birth control/protection: Post-menopausal  Other Topics Concern   Not on file  Social History Narrative   08/14/20   [place]  Living: to be near daughter   Work: retired      FPhysiological scientistchildren - JIT consultant(nearby) and son JEvelena Peat[place]in [place]  8 grandchildren, & 2 great-grandchildren.      Enjoys:  stays at home      Exercise: walking to her daughter's store   Diet: eats fruit, air fried chicken      Safety   Seat belts: Yes    Guns: No   Safe in relationships: Yes    Social Determinants of Health   Financial Resource Strain: Medium Risk (08/24/2022)   Overall Financial Resource Strain (CARDIA)    Difficulty of Paying Living Expenses: Somewhat hard  Food Insecurity: No Food Insecurity (11/08/2022)   Hunger Vital Sign    Worried About Running Out of Food in the Last Year: Never true    Ran Out of Food in the Last Year: Never true  Recent Concern: Food Insecurity - Food Insecurity Present (08/30/2022)   Hunger Vital Sign    Worried About Running Out of Food in the Last Year: Sometimes true    Ran Out of Food in the Last Year: Sometimes true  Transportation Needs: No Transportation Needs (11/08/2022)   PRAPARE - THydrologist(Medical): No  Lack of Transportation (Non-Medical): No  Recent Concern: Transportation Needs - Unmet Transportation Needs (08/30/2022)   PRAPARE - Hydrologist (Medical): Yes    Lack of Transportation (Non-Medical): No  Physical Activity: Inactive (08/24/2022)   Exercise Vital Sign    Days of Exercise per Week: 0 days    Minutes of Exercise per Session: 0 min  Stress: Stress Concern Present (08/24/2022)   Phil Campbell    Feeling of Stress : To some extent  Social Connections: Socially Isolated (08/24/2022)   Social Connection and Isolation Panel [NHANES]    Frequency of Communication with Friends and Family: More than three times a week    Frequency of Social Gatherings with Friends and Family: More than three times a week    Attends Religious Services: Never    Marine scientist or Organizations: No    Attends Archivist Meetings: Never    Marital Status: Widowed  Intimate Partner Violence: Not At Risk (11/08/2022)    Humiliation, Afraid, Rape, and Kick questionnaire    Fear of Current or Ex-Partner: No    Emotionally Abused: No    Physically Abused: No    Sexually Abused: No     Outpatient Medications Prior to Visit  Medication Sig Dispense Refill   acyclovir (ZOVIRAX) 400 MG tablet Take 400 mg by mouth daily.     albuterol (VENTOLIN HFA) 108 (90 Base) MCG/ACT inhaler TAKE 2 PUFFS BY MOUTH EVERY 6 HOURS AS NEEDED FOR WHEEZE OR SHORTNESS OF BREATH (Patient taking differently: Inhale 2 puffs into the lungs every 6 (six) hours as needed for wheezing or shortness of breath. TAKE 2 PUFFS BY MOUTH EVERY 6 HOURS AS NEEDED FOR WHEEZE OR SHORTNESS OF BREATH) 18 g 1   amoxicillin-clavulanate (AUGMENTIN) 875-125 MG tablet Take 1 tablet by mouth 2 (two) times daily. 60 tablet 1   clobetasol cream (TEMOVATE) [id number] % Apply 1 Application topically daily.     furosemide (LASIX) 20 MG tablet Take 1 tablet (20 mg total) by mouth daily. 90 tablet 3   insulin NPH-regular Human (70-30) 100 UNIT/ML injection Inject 4 Units into the skin 2 (two) times daily with a meal. Use only for hyperglycemia, CBG+ 300. Start with 5 units, titrate up to max 10 unit per dose if need. (Patient not taking: Reported on 12/01/2022) 10 mL 0   ipratropium-albuterol (DUONEB) 0.5-2.5 (3) MG/3ML SOLN Take 3 mLs by nebulization every 6 (six) hours as needed (shortness of breath or wheezing).     lenalidomide (REVLIMID) 10 MG capsule Take 1 capsule (10 mg total) by mouth daily. Take for 14 days, then hold for 7 days. 14 capsule 0   levothyroxine (SYNTHROID) 50 MCG tablet Take 1 tablet (50 mcg total) by mouth daily at 6 (six) AM. 30 tablet 0   loperamide (IMODIUM) 2 MG capsule Take 2 mg by mouth as needed for diarrhea or loose stools.     midodrine (PROAMATINE) 10 MG tablet Take 1 tablet (10 mg total) by mouth 3 (three) times daily with meals. 60 tablet 0   ondansetron (ZOFRAN) 8 MG tablet Take 8 mg by mouth every 8 (eight) hours as needed for nausea,  vomiting or refractory nausea / vomiting. Take 1 tablet q6H for 4 days after chemotherapy. (Patient not taking: Reported on 12/01/2022)     pantoprazole (PROTONIX) 40 MG tablet Take 1 tablet (40 mg total) by mouth daily. (Patient not taking: Reported  on 12/01/2022) 30 tablet 0   prochlorperazine (COMPAZINE) 10 MG tablet Take 1 tablet by mouth every 6 (six) hours as needed for nausea or vomiting. Take 1 tab q6H for 4 days after chemotherapy. (Patient not taking: Reported on 12/01/2022)     simethicone (MYLICON) 80 MG chewable tablet Chew 180 mg by mouth every 6 (six) hours as needed for flatulence. Take 1 capsule PO PRN gas relief.     Tenofovir Alafenamide Fumarate (VEMLIDY) 25 MG TABS Take 1 tablet (25 mg total) by mouth daily. 30 tablet 3   No facility-administered medications prior to visit.    No Known Allergies  ROS Review of Systems    Objective:    Physical Exam  There were no vitals taken for this visit. Wt Readings from Last 3 Encounters:  12/01/22 169 lb 12.1 oz (77 kg)  12/01/22 169 lb 12.1 oz (77 kg)  11/17/22 165 lb (74.8 kg)     Health Maintenance  Topic Date Due   Zoster Vaccines- Shingrix (1 of 2) Never done   COLONOSCOPY (Pts 45-27yr Insurance coverage will need to be confirmed)  Never done   DEXA SCAN  Never done   MAMMOGRAM  09/20/2020   OPHTHALMOLOGY EXAM  01/20/2021   FOOT EXAM  08/21/2022   COVID-19 Vaccine (1) 12/19/2022 (Originally 12/30/1950)   HEMOGLOBIN A1C  04/22/2023   DTaP/Tdap/Td (2 - Td or Tdap) 07/07/2031   Pneumonia Vaccine 75 Years old  Completed   INFLUENZA VACCINE  Completed   Hepatitis C Screening  Completed   HPV VACCINES  Aged Out   Lung Cancer Screening  Discontinued    There are no preventive care reminders to display for this patient.  Lab Results  Component Value Date   TSH 6.064 (H) 07/28/2022   Lab Results  Component Value Date   WBC 1.4 (LL) 12/01/2022   HGB 7.4 (L) 12/01/2022   HCT 25.5 (L) 12/01/2022   MCV 98.5  12/01/2022   PLT 140 (L) 12/01/2022   Lab Results  Component Value Date   NA 141 12/01/2022   K 4.8 12/01/2022   CO2 33 (H) 12/01/2022   GLUCOSE 216 (H) 12/01/2022   BUN 42 (H) 12/01/2022   CREATININE 0.86 12/01/2022   BILITOT 0.9 12/01/2022   ALKPHOS 167 (H) 12/01/2022   AST 22 12/01/2022   ALT 17 12/01/2022   PROT 4.9 (L) 12/01/2022   ALBUMIN 3.0 (L) 12/01/2022   CALCIUM 10.7 (H) 12/01/2022   ANIONGAP 6 12/01/2022   EGFR 61 06/22/2022   Lab Results  Component Value Date   CHOL 83 10/21/2022   Lab Results  Component Value Date   HDL 12 (L) 10/21/2022   Lab Results  Component Value Date   LDLCALC 56 10/21/2022   Lab Results  Component Value Date   TRIG 75 10/21/2022   Lab Results  Component Value Date   CHOLHDL 6.9 10/21/2022   Lab Results  Component Value Date   HGBA1C 5.6 10/21/2022      Assessment & Plan:   Problem List Items Addressed This Visit       Cardiovascular and Mediastinum   Atherosclerosis of aorta (HCC)   Hypotension   Chronic diastolic CHF (congestive heart failure) (HCC)     Respiratory   Acute on chronic respiratory failure with hypoxia and hypercarbia (HCC)     Digestive   Acute GI bleeding - Primary     Endocrine   Type 2 diabetes mellitus with proteinuria ([place]  Hypothyroidism, unspecified     Immune and Lymphatic   Generalized lymphadenopathy     Genitourinary   Chronic kidney disease, stage 3a (HCC)     No orders of the defined types were placed in this encounter.    Follow-up: No follow-ups on file.    [name], NP

## 2022-12-03 NOTE — Telephone Encounter (Signed)
Olin Hauser from [facility] called wanting an update on home health orders being signed by the provider

## 2022-12-04 ENCOUNTER — Encounter: Payer: Self-pay | Admitting: Oncology

## 2022-12-06 ENCOUNTER — Telehealth: Payer: Self-pay | Admitting: *Deleted

## 2022-12-06 ENCOUNTER — Ambulatory Visit: Payer: Self-pay

## 2022-12-06 NOTE — Patient Outreach (Deleted)
  Care Coordination   12/06/2022 Name: Rokia Bosket Qadir MRN: [id number] DOB: 1945/12/16   Care Coordination Outreach Attempts:  A second unsuccessful outreach was attempted today to offer the patient with information about available care coordination services as a benefit of their health plan.     Follow Up Plan:  Additional outreach attempts will be made to offer the patient care coordination information and services.   Encounter Outcome:  No Answer {THN Tip this will not be part of the note when signed-REQUIRED REPORT FIELD DO NOT DELETE (Optional):27901}  Care Coordination Interventions:  No, not indicated  {THN Tip this will not be part of the note when signed-REQUIRED REPORT FIELD DO NOT DELETE (Optional):27901}  Quinn Plowman RN,BSN,CCM [facility] direct line

## 2022-12-06 NOTE — Telephone Encounter (Signed)
Called the daughter [name] and she says she can take her to the appt. I told her that she [facility]ll [facility] for 2:30 procedure.Marland Kitchen

## 2022-12-06 NOTE — Patient Outreach (Unsigned)
Care Coordination   Follow Up Visit Note   12/06/2022 Name: Natasha [name] MRN: [id number] DOB: [birth year]  Natasha [name] is a 77 y.o. year old female who sees Natasha Lo, NP for primary care. I  daughter, Natasha [name]  What matters to the patients health and wellness today?  Concern regarding transportation for patients chemotherapy appointments.    Daughter states patient is scheduled to have a thoracenteses on tomorrow due to fluid build up in lungs.  Daughter states she will provide patient with transportation on tomorrow for this appointment.  She states patient does have shortness of breath and swelling in lower extremities.  Daughter states patient has some blisters on her lower legs that burst. She states her daughter is attempting to keep these areas clean and applies band aids.    Daughter states patient is having difficulty with her [facility] transportation service. She states there has been occasion where the transportation service has not picked up patient for her appointment.  Daughter also states on 11/26/22 patient was picked up early for an 8:30 am doctor appointment.  She states the patient was dropped off before the doctors office opened therefore having to stand outside to wait.  Daughter states she called and complained to the transportation service.  Daughter states she is waiting to hear back from the cancer center about using their transportation service for patients chemotherapy treatment visits. Daughter states she has not heard whether patients home health services are to continue.    Goals Addressed             This Visit's Progress    Patient / caregiver stated:  Management of health conditions       Care Coordination Interventions Evaluation of current treatment plan related to management of health conditions and patient's adherence to plan as established by provider: Discussed with daughter per [facility] home health physical therapist, Natasha [name]  referral will be made for OT eval, speech therapy,  home health aid, and shower chair as well as request to provider for Podiatry referral for toenail care.   Daughter advised to contact patients health insurance plan to determine OTC benefit allowance and/ or coverage for shower chair.  Discussed medications and discussed importance of compliance: Daughter states only medication change for patient is addition of new chemotherapy meds.   Reviewed scheduled/upcoming provider appointments:    Discussed plans with patient for ongoing care management follow up. Daughter verbally agreed to next scheduled telephone follow up with RNCM on 12/13/22. Advised to contact provider for any mild to moderate symptoms. Call 911 for severe symptoms. Call to [facility] to determine receipt of home health order.  Daughter to follow up with Natasha [name] at cancer center regarding transportation for chemotherapy treatments.  Collaborated with Natasha [name], Director of case management regarding patients concerns with [facility] transportation service.  Message sent to primary care provider to resend home health order to [facility].                SDOH assessments and interventions completed:  No{THN Tip this will not be part of the note when signed-REQUIRED REPORT FIELD DO NOT DELETE (Optional):27901}     Care Coordination Interventions:  Yes, provided {THN Tip this will not be part of the note when signed-REQUIRED REPORT FIELD DO NOT DELETE (Optional):27901}  Follow up plan: Follow up call scheduled for 12/14/22    Encounter Outcome:  Pt. Visit Completed {THN Tip this will not be part of the note when  signed-REQUIRED REPORT FIELD DO NOT DELETE (Optional):27901}  Natasha Plowman RN,BSN,CCM [facility] Coordination [phone] direct line

## 2022-12-07 ENCOUNTER — Other Ambulatory Visit: Payer: Self-pay | Admitting: Internal Medicine

## 2022-12-07 ENCOUNTER — Ambulatory Visit
Admission: RE | Admit: 2022-12-07 | Discharge: 2022-12-07 | Disposition: A | Discharge status: Home / Self Care | Payer: 59 | Source: Ambulatory Visit | Attending: Oncology | Admitting: Oncology

## 2022-12-07 ENCOUNTER — Ambulatory Visit
Admission: RE | Admit: 2022-12-07 | Discharge: 2022-12-07 | Disposition: A | Discharge status: Home / Self Care | Payer: 59 | Source: Ambulatory Visit | Attending: Internal Medicine | Admitting: Internal Medicine

## 2022-12-07 ENCOUNTER — Telehealth: Payer: Self-pay | Admitting: *Deleted

## 2022-12-07 DIAGNOSIS — Z9889 Other specified postprocedural states: Secondary | ICD-10-CM

## 2022-12-07 DIAGNOSIS — C8300 Small cell B-cell lymphoma, unspecified site: Secondary | ICD-10-CM

## 2022-12-07 DIAGNOSIS — Z8572 Personal history of non-Hodgkin lymphomas: Secondary | ICD-10-CM | POA: Diagnosis not present

## 2022-12-07 DIAGNOSIS — J984 Other disorders of lung: Secondary | ICD-10-CM | POA: Diagnosis not present

## 2022-12-07 DIAGNOSIS — J910 Malignant pleural effusion: Secondary | ICD-10-CM | POA: Diagnosis not present

## 2022-12-07 DIAGNOSIS — J90 Pleural effusion, not elsewhere classified: Secondary | ICD-10-CM

## 2022-12-07 LAB — ALBUMIN, PLEURAL OR PERITONEAL FLUID: Albumin, Fluid: <1.5 g/dL

## 2022-12-07 LAB — LACTATE DEHYDROGENASE, PLEURAL OR PERITONEAL FLUID: LD, Fluid: 73 U/L — ABNORMAL HIGH (ref 3–23)

## 2022-12-07 LAB — AMYLASE, PLEURAL OR PERITONEAL FLUID: Amylase, Fluid: 32 U/L

## 2022-12-07 MED ORDER — LIDOCAINE HCL (PF) 1 % IJ SOLN: 10.0000 mL | Freq: Once | INTRAMUSCULAR | Status: DC

## 2022-12-07 MED FILL — Dexamethasone Sodium Phosphate Inj 100 MG/10ML: INTRAMUSCULAR | Qty: 1 | Status: AC

## 2022-12-07 NOTE — Telephone Encounter (Signed)
Pt has missed several appts . One daythe [facility] bus brought the pt over and dropped her off at 6:30 in am. Our office does not open til about 7:45 am. So she was in the cold for a long time. The next 3 times that pt was suppose to come here it did not work. The first time [facility] had told family that they will pick her up and no one came to pick her up and she came by non emergency tranpsort. The next 2 times after that they said she was set up and they never came and it kept pt from getting her treatment. I have checked with Jerene Pitch and [facility] and because of the issues. Our Lucianne Lei will pick her up each appt to cancer center but daughter [name] will come get her each time in afternoon.

## 2022-12-07 NOTE — Telephone Encounter (Signed)
Refaxed 12/07/2022

## 2022-12-07 NOTE — Procedures (Signed)
PROCEDURE SUMMARY:  Successful US guided diagnostic and therapeutic right thoracentesis. Yielded 1.2 L of clear, amber fluid. Pt tolerated procedure well. No immediate complications.  Specimen was sent for labs. CXR ordered.  EBL < 1 mL  Tyson Alias, AGNP 12/07/2022 3:42 PM

## 2022-12-08 ENCOUNTER — Inpatient Hospital Stay (HOSPITAL_BASED_OUTPATIENT_CLINIC_OR_DEPARTMENT_OTHER): Payer: 59 | Admitting: Medical Oncology

## 2022-12-08 ENCOUNTER — Ambulatory Visit: Payer: Medicaid Other | Admitting: Oncology

## 2022-12-08 ENCOUNTER — Ambulatory Visit: Payer: Medicaid Other

## 2022-12-08 ENCOUNTER — Inpatient Hospital Stay: Payer: 59

## 2022-12-08 ENCOUNTER — Other Ambulatory Visit: Payer: Self-pay | Admitting: Oncology

## 2022-12-08 VITALS — BP 119/60 | HR 83 | Temp 98.9°F | Resp 16

## 2022-12-08 VITALS — BP 106/59 | HR 80 | Temp 98.7°F | Resp 18

## 2022-12-08 DIAGNOSIS — M7989 Other specified soft tissue disorders: Secondary | ICD-10-CM | POA: Diagnosis not present

## 2022-12-08 DIAGNOSIS — R21 Rash and other nonspecific skin eruption: Secondary | ICD-10-CM | POA: Diagnosis not present

## 2022-12-08 DIAGNOSIS — Z79620 Long term (current) use of immunosuppressive biologic: Secondary | ICD-10-CM | POA: Diagnosis not present

## 2022-12-08 DIAGNOSIS — R0602 Shortness of breath: Secondary | ICD-10-CM | POA: Diagnosis not present

## 2022-12-08 DIAGNOSIS — M47814 Spondylosis without myelopathy or radiculopathy, thoracic region: Secondary | ICD-10-CM | POA: Diagnosis not present

## 2022-12-08 DIAGNOSIS — J449 Chronic obstructive pulmonary disease, unspecified: Secondary | ICD-10-CM | POA: Diagnosis not present

## 2022-12-08 DIAGNOSIS — R5383 Other fatigue: Secondary | ICD-10-CM | POA: Diagnosis not present

## 2022-12-08 DIAGNOSIS — N644 Mastodynia: Secondary | ICD-10-CM | POA: Diagnosis not present

## 2022-12-08 DIAGNOSIS — M4856XA Collapsed vertebra, not elsewhere classified, lumbar region, initial encounter for fracture: Secondary | ICD-10-CM | POA: Diagnosis not present

## 2022-12-08 DIAGNOSIS — D649 Anemia, unspecified: Secondary | ICD-10-CM | POA: Diagnosis not present

## 2022-12-08 DIAGNOSIS — J90 Pleural effusion, not elsewhere classified: Secondary | ICD-10-CM | POA: Diagnosis not present

## 2022-12-08 DIAGNOSIS — M5136 Other intervertebral disc degeneration, lumbar region: Secondary | ICD-10-CM | POA: Diagnosis not present

## 2022-12-08 DIAGNOSIS — Z5112 Encounter for antineoplastic immunotherapy: Secondary | ICD-10-CM | POA: Diagnosis not present

## 2022-12-08 DIAGNOSIS — D6959 Other secondary thrombocytopenia: Secondary | ICD-10-CM | POA: Diagnosis not present

## 2022-12-08 DIAGNOSIS — C8300 Small cell B-cell lymphoma, unspecified site: Secondary | ICD-10-CM

## 2022-12-08 DIAGNOSIS — J948 Other specified pleural conditions: Secondary | ICD-10-CM | POA: Diagnosis not present

## 2022-12-08 DIAGNOSIS — Z794 Long term (current) use of insulin: Secondary | ICD-10-CM | POA: Diagnosis not present

## 2022-12-08 DIAGNOSIS — C8308 Small cell B-cell lymphoma, lymph nodes of multiple sites: Secondary | ICD-10-CM | POA: Diagnosis not present

## 2022-12-08 DIAGNOSIS — E119 Type 2 diabetes mellitus without complications: Secondary | ICD-10-CM | POA: Diagnosis not present

## 2022-12-08 DIAGNOSIS — K922 Gastrointestinal hemorrhage, unspecified: Secondary | ICD-10-CM | POA: Diagnosis not present

## 2022-12-08 DIAGNOSIS — R590 Localized enlarged lymph nodes: Secondary | ICD-10-CM | POA: Diagnosis not present

## 2022-12-08 DIAGNOSIS — I2510 Atherosclerotic heart disease of native coronary artery without angina pectoris: Secondary | ICD-10-CM | POA: Diagnosis not present

## 2022-12-08 DIAGNOSIS — K8020 Calculus of gallbladder without cholecystitis without obstruction: Secondary | ICD-10-CM | POA: Diagnosis not present

## 2022-12-08 DIAGNOSIS — I509 Heart failure, unspecified: Secondary | ICD-10-CM | POA: Diagnosis not present

## 2022-12-08 LAB — CBC WITH DIFFERENTIAL/PLATELET
Abs Immature Granulocytes: 0.01 10*3/uL (ref 0.00–0.07)
Basophils Absolute: 0 10*3/uL (ref 0.0–0.1)
Basophils Relative: 1 %
Eosinophils Absolute: 0 10*3/uL (ref 0.0–0.5)
Eosinophils Relative: 3 %
HCT: 25.2 % — ABNORMAL LOW (ref 36.0–46.0)
Hemoglobin: 7.6 g/dL — ABNORMAL LOW (ref 12.0–15.0)
Immature Granulocytes: 1 %
Lymphocytes Relative: 71 %
Lymphs Abs: 0.9 10*3/uL (ref 0.7–4.0)
MCH: 29.1 pg (ref 26.0–34.0)
MCHC: 30.2 g/dL (ref 30.0–36.0)
MCV: 96.6 fL (ref 80.0–100.0)
Monocytes Absolute: 0.2 10*3/uL (ref 0.1–1.0)
Monocytes Relative: 18 %
Neutro Abs: 0.1 10*3/uL — CL (ref 1.7–7.7)
Neutrophils Relative %: 6 %
Platelets: 146 10*3/uL — ABNORMAL LOW (ref 150–400)
RBC: 2.61 MIL/uL — ABNORMAL LOW (ref 3.87–5.11)
RDW: 19.6 % — ABNORMAL HIGH (ref 11.5–15.5)
WBC: 1.3 10*3/uL — ABNORMAL LOW (ref 4.0–10.5)
nRBC: 0 % (ref 0.0–0.2)

## 2022-12-08 LAB — COMPREHENSIVE METABOLIC PANEL
ALT: 13 U/L (ref 0–44)
AST: 16 U/L (ref 15–41)
Albumin: 3 g/dL — ABNORMAL LOW (ref 3.5–5.0)
Alkaline Phosphatase: 113 U/L (ref 38–126)
Anion gap: 5 (ref 5–15)
BUN: 33 mg/dL — ABNORMAL HIGH (ref 8–23)
CO2: 37 mmol/L — ABNORMAL HIGH (ref 22–32)
Calcium: 10.7 mg/dL — ABNORMAL HIGH (ref 8.9–10.3)
Chloride: 95 mmol/L — ABNORMAL LOW (ref 98–111)
Creatinine, Ser: 0.6 mg/dL (ref 0.44–1.00)
GFR, Estimated: >60 mL/min (ref >60)
Glucose, Bld: 206 mg/dL — ABNORMAL HIGH (ref 70–99)
Potassium: 4.5 mmol/L (ref 3.5–5.1)
Sodium: 137 mmol/L (ref 135–145)
Total Bilirubin: 0.9 mg/dL (ref 0.3–1.2)
Total Protein: 4.9 g/dL — ABNORMAL LOW (ref 6.5–8.1)

## 2022-12-08 MED ORDER — NYSTATIN-TRIAMCINOLONE 100000-0.1 UNIT/GM-% EX OINT: 1.0000 | TOPICAL_OINTMENT | Freq: Two times a day (BID) | CUTANEOUS | 0 refills | Status: DC

## 2022-12-08 MED ORDER — SODIUM CHLORIDE 0.9 % IV SOLN
Freq: Once | INTRAVENOUS | Status: DC
  Filled 2022-12-08: qty 250

## 2022-12-08 MED ORDER — DIPHENHYDRAMINE HCL 25 MG PO CAPS
50.0000 mg | ORAL_CAPSULE | Freq: Once | ORAL | Status: AC
  Administered 2022-12-08: 50 mg via ORAL
  Filled 2022-12-08: qty 2

## 2022-12-08 MED ORDER — SODIUM CHLORIDE 0.9 % IV SOLN: 300.0000 mg | Freq: Once | INTRAVENOUS | Status: DC

## 2022-12-08 MED ORDER — SODIUM CHLORIDE 0.9 % IV SOLN: 12.0000 mg | Freq: Once | INTRAVENOUS | Status: DC

## 2022-12-08 MED ORDER — SODIUM CHLORIDE 0.9% FLUSH
10.0000 mL | INTRAVENOUS | Status: DC | PRN
  Filled 2022-12-08: qty 10

## 2022-12-08 MED ORDER — HEPARIN SOD (PORK) LOCK FLUSH 100 UNIT/ML IV SOLN
500.0000 [IU] | Freq: Once | INTRAVENOUS | Status: AC | PRN
  Administered 2022-12-08: 500 [IU]
  Filled 2022-12-08: qty 5

## 2022-12-08 MED ORDER — HEPARIN SOD (PORK) LOCK FLUSH 100 UNIT/ML IV SOLN
500.0000 [IU] | Freq: Once | INTRAVENOUS | Status: DC | PRN
  Filled 2022-12-08: qty 5

## 2022-12-08 MED ORDER — SODIUM CHLORIDE 0.9 % IV SOLN
Freq: Once | INTRAVENOUS | Status: AC
  Filled 2022-12-08: qty 250

## 2022-12-08 MED ORDER — ACETAMINOPHEN 325 MG PO TABS
650.0000 mg | ORAL_TABLET | Freq: Once | ORAL | Status: AC
  Administered 2022-12-08: 650 mg via ORAL
  Filled 2022-12-08: qty 2

## 2022-12-08 MED ORDER — SODIUM CHLORIDE 0.9 % IV SOLN
10.0000 mg | Freq: Once | INTRAVENOUS | Status: AC
  Administered 2022-12-08: 10 mg via INTRAVENOUS
  Filled 2022-12-08: qty 10

## 2022-12-08 MED ORDER — ACETAMINOPHEN 325 MG PO TABS: 650.0000 mg | ORAL_TABLET | Freq: Once | ORAL | Status: DC

## 2022-12-08 MED ORDER — DIPHENHYDRAMINE HCL 25 MG PO CAPS: 50.0000 mg | ORAL_CAPSULE | Freq: Once | ORAL | Status: DC

## 2022-12-08 MED ORDER — SODIUM CHLORIDE 0.9 % IV SOLN
400.0000 mg | Freq: Once | INTRAVENOUS | Status: AC
  Administered 2022-12-08: 400 mg via INTRAVENOUS
  Filled 2022-12-08: qty 40

## 2022-12-08 NOTE — Progress Notes (Signed)
Pt voiced pain and swelling to left breast. Breast assessed and noted slightly larger in size than right breast, and slight redness noted under breast. Pt complains of pain to entire breast upon palpation. Notified symptom management team for evaluation. Pt seen in infusion area by Nelwyn Salisbury, Patrick.

## 2022-12-08 NOTE — Patient Instructions (Signed)
Rituximab Injection What is this medication? RITUXIMAB (ri TUX i mab) treats leukemia and lymphoma. It works by blocking a protein that causes cancer cells to grow and multiply. This helps to slow or stop the spread of cancer cells. It may also be used to treat autoimmune conditions, such as arthritis. It works by slowing down an overactive immune system. It is a monoclonal antibody. This medicine may be used for other purposes; ask your health care provider or pharmacist if you have questions. COMMON BRAND NAME(S): RIABNI, Rituxan, RUXIENCE, truxima What should I tell my care team before I take this medication? They need to know if you have any of these conditions: Chest pain Heart disease Immune system problems Infection, such as chickenpox, cold sores, hepatitis B, herpes Irregular heartbeat or rhythm Kidney disease Low blood counts, such as low white cells, platelets, red cells Lung disease Recent or upcoming vaccine An unusual or allergic reaction to rituximab, other medications, foods, dyes, or preservatives Pregnant or trying to get pregnant Breast-feeding How should I use this medication? This medication is injected into a vein. It is given by a care team in a hospital or clinic setting. A special MedGuide will be given to you before each treatment. Be sure to read this information carefully each time. Talk to your care team about the use of this medication in children. While this medication may be prescribed for children as young as 6 months for selected conditions, precautions do apply. Overdosage: If you think you have taken too much of this medicine contact a poison control center or emergency room at once. NOTE: This medicine is only for you. Do not share this medicine with others. What if I miss a dose? Keep appointments for follow-up doses. It is important not to miss your dose. Call your care team if you are unable to keep an appointment. What may interact with this  medication? Do not take this medication with any of the following: Live vaccines This medication may also interact with the following: Cisplatin This list may not describe all possible interactions. Give your health care provider a list of all the medicines, herbs, non-prescription drugs, or dietary supplements you use. Also tell them if you smoke, drink alcohol, or use illegal drugs. Some items may interact with your medicine. What should I watch for while using this medication? Your condition will be monitored carefully while you are receiving this medication. You may need blood work while taking this medication. This medication can cause serious infusion reactions. To reduce the risk your care team may give you other medications to take before receiving this one. Be sure to follow the directions from your care team. This medication may increase your risk of getting an infection. Call your care team for advice if you get a fever, chills, sore throat, or other symptoms of a cold or flu. Do not treat yourself. Try to avoid being around people who are sick. Call your care team if you are around anyone with measles, chickenpox, or if you develop sores or blisters that do not heal properly. Avoid taking medications that contain aspirin, acetaminophen, ibuprofen, naproxen, or ketoprofen unless instructed by your care team. These medications may hide a fever. This medication may cause serious skin reactions. They can happen weeks to months after starting the medication. Contact your care team right away if you notice fevers or flu-like symptoms with a rash. The rash may be red or purple and then turn into blisters or peeling of the skin.  You may also notice a red rash with swelling of the face, lips, or lymph nodes in your neck or under your arms. In some patients, this medication may cause a serious brain infection that may cause death. If you have any problems seeing, thinking, speaking, walking, or  standing, tell your care team right away. If you cannot reach your care team, urgently seek another source of medical care. Talk to your care team if you may be pregnant. Serious birth defects can occur if you take this medication during pregnancy and for 12 months after the last dose. You will need a negative pregnancy test before starting this medication. Contraception is recommended while taking this medication and for 12 months after the last dose. Your care team can help you find the option that works for you. Do not breastfeed while taking this medication and for at least 6 months after the last dose. What side effects may I notice from receiving this medication? Side effects that you should report to your care team as soon as possible: Allergic reactions or angioedema--skin rash, itching or hives, swelling of the face, eyes, lips, tongue, arms, or legs, trouble swallowing or breathing Bowel blockage--stomach cramping, unable to have a bowel movement or pass gas, loss of appetite, vomiting Dizziness, loss of balance or coordination, confusion or trouble speaking Heart attack--pain or tightness in the chest, shoulders, arms, or jaw, nausea, shortness of breath, cold or clammy skin, feeling faint or lightheaded Heart rhythm changes--fast or irregular heartbeat, dizziness, feeling faint or lightheaded, chest pain, trouble breathing Infection--fever, chills, cough, sore throat, wounds that don't heal, pain or trouble when passing urine, general feeling of discomfort or being unwell Infusion reactions--chest pain, shortness of breath or trouble breathing, feeling faint or lightheaded Kidney injury--decrease in the amount of urine, swelling of the ankles, hands, or feet Liver injury--right upper belly pain, loss of appetite, nausea, light-colored stool, dark yellow or brown urine, yellowing skin or eyes, unusual weakness or fatigue Redness, blistering, peeling, or loosening of the skin, including  inside the mouth Stomach pain that is severe, does not go away, or gets worse Tumor lysis syndrome (TLS)--nausea, vomiting, diarrhea, decrease in the amount of urine, dark urine, unusual weakness or fatigue, confusion, muscle pain or cramps, fast or irregular heartbeat, joint pain Side effects that usually do not require medical attention (report to your care team if they continue or are bothersome): Headache Joint pain Nausea Runny or stuffy nose Unusual weakness or fatigue This list may not describe all possible side effects. Call your doctor for medical advice about side effects. You may report side effects to FDA at 1-800-FDA-1088. Where should I keep my medication? This medication is given in a hospital or clinic. It will not be stored at home. NOTE: This sheet is a summary. It may not cover all possible information. If you have questions about this medicine, talk to your doctor, pharmacist, or health care provider.  2023 Elsevier/Gold Standard (2022-02-23 00:00:00)

## 2022-12-08 NOTE — Progress Notes (Signed)
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 28 days   Date of visit: 12/08/22  Reason for Consult: Natasha [name] is a 77 y.o. female who presents today for:  Breast tenderness: Patient reports that for the past 3 days she has had breast tenderness of her left breast. The tenderness is worse when the skin is touched and worsens when she moves her arm. She is unable to describe the pain other than sharp. The pain does not radiate. She has not tried anything for the pain. No fevers, rash, chills. Last mammogram was prior to her current diagnosis.    Denies any neurologic complaints. Denies recent fevers or illnesses. Denies any easy bleeding or bruising. Reports good appetite and denies weight loss. Denies chest pain. Denies any nausea, vomiting, constipation, or diarrhea. Denies urinary complaints. Patient offers no further specific complaints today.    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; [name]; 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  09/08/2018 Initial Diagnosis   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[place]   Nodal marginal zone B[place]cell lymphoma [place]  07/28/2022 Initial Diagnosis   Marginal zone lymphoma ([place]   08/01/2022 Cancer Staging   Staging form: Hodgkin and Non-Hodgkin Lymphoma, AJCC 8th Edition - Clinical stage from 08/01/2022: Stage IV (Marginal zone lymphoma) - Signed by RSindy Guadeloupe MD on 08/01/2022   08/17/2022 - 08/17/2022 Chemotherapy   Patient is on Treatment Plan : NON-HODGKINS LYMPHOMA Rituximab D1 + Bendamustine D1,2 q28d x 6 cycles     11/17/2022 -  Chemotherapy   Patient is on Treatment Plan : NON-HODGKINS LYMPHOMA Rituximab q7d       ALLERGIES:  has No Known Allergies.  MEDICATIONS:  Current Outpatient Medications  Medication Sig Dispense Refill   nystatin-triamcinolone ointment (MYCOLOG) Apply 1 Application topically 2 (two) times daily. 30 g 0   acyclovir (ZOVIRAX) 400 MG tablet Take 400 mg by mouth daily.     albuterol (VENTOLIN HFA) 108 (90 Base) MCG/ACT inhaler TAKE 2 PUFFS BY MOUTH EVERY 6 HOURS  AS NEEDED FOR WHEEZE OR SHORTNESS OF BREATH (Patient taking differently: Inhale 2 puffs into the lungs every 6 (six) hours as needed for wheezing or shortness of breath. TAKE 2 PUFFS BY MOUTH EVERY 6 HOURS AS NEEDED FOR WHEEZE OR SHORTNESS OF BREATH) 18 g 1   amoxicillin-clavulanate (AUGMENTIN) 875-125 MG tablet Take 1 tablet by mouth 2 (two) times daily. 60 tablet 1   clobetasol cream (TEMOVATE) [id number] % Apply 1  Application topically daily.     furosemide (LASIX) 20 MG tablet Take 1 tablet (20 mg total) by mouth daily. 90 tablet 3   insulin NPH-regular Human (70-30) 100 UNIT/ML injection Inject 4 Units into the skin 2 (two) times daily with a meal. Use only for hyperglycemia, CBG+ 300. Start with 5 units, titrate up to max 10 unit per dose if need. (Patient not taking: Reported on 12/01/2022) 10 mL 0   ipratropium-albuterol (DUONEB) 0.5-2.5 (3) MG/3ML SOLN Take 3 mLs by nebulization every 6 (six) hours as needed (shortness of breath or wheezing).     lenalidomide (REVLIMID) 10 MG capsule Take 1 capsule (10 mg total) by mouth daily. Take for 14 days, then hold for 7 days. 14 capsule 0   levothyroxine (SYNTHROID) 50 MCG tablet Take 1 tablet (50 mcg total) by mouth daily at 6 (six) AM. 30 tablet 0   loperamide (IMODIUM) 2 MG capsule Take 2 mg by mouth as needed for diarrhea or loose stools.     midodrine (PROAMATINE) 10 MG tablet Take 1 tablet (10 mg total) by mouth 3 (three) times daily with meals. 60 tablet 0   ondansetron (ZOFRAN) 8 MG tablet Take 8 mg by mouth every 8 (eight) hours as needed for nausea, vomiting or refractory nausea / vomiting. Take 1 tablet q6H for 4 days after chemotherapy. (Patient not taking: Reported on 12/01/2022)     pantoprazole (PROTONIX) 40 MG tablet Take 1 tablet (40 mg total) by mouth daily. (Patient not taking: Reported on 12/01/2022) 30 tablet 0   prochlorperazine (COMPAZINE) 10 MG tablet Take 1 tablet by mouth every 6 (six) hours as needed for nausea or vomiting. Take 1 tab q6H for 4 days after chemotherapy. (Patient not taking: Reported on 12/01/2022)     simethicone (MYLICON) 80 MG chewable tablet Chew 180 mg by mouth every 6 (six) hours as needed for flatulence. Take 1 capsule PO PRN gas relief.     Tenofovir Alafenamide Fumarate (VEMLIDY) 25 MG TABS Take 1 tablet (25 mg total) by mouth daily. 30 tablet 3   No current facility-administered medications for this visit.    Facility-Administered Medications Ordered in Other Visits  Medication Dose Route Frequency Provider Last Rate Last Admin   0.9 %  sodium chloride infusion   Intravenous Once Sindy Guadeloupe, MD       acetaminophen (TYLENOL) tablet 650 mg  650 mg Oral Once Sindy Guadeloupe, MD       dexamethasone (DECADRON) 10 mg in sodium chloride 0.9 % 50 mL IVPB  10 mg Intravenous Once Sindy Guadeloupe, MD       diphenhydrAMINE (BENADRYL) capsule 50 mg  50 mg Oral Once Sindy Guadeloupe, MD       heparin lock flush 100 unit/mL  500 Units Intracatheter Once PRN Sindy Guadeloupe, MD       riTUXimab-pvvr (RUXIENCE) 300 mg in sodium chloride 0.9 % 250 mL (1.0714 mg/mL) infusion  300 mg Intravenous Once Sindy Guadeloupe, MD       sodium  chloride flush (NS) 0.9 % injection 10 mL  10 mL Intracatheter PRN Sindy Guadeloupe, MD        VITAL SIGNS: BP (!) 106/59   Pulse 80   Temp 98.7 F (37.1 C)   Resp 18   SpO2 100%  There were no vitals filed for this visit.  Estimated body mass index is 29.14 kg/m as calculated from the following:   Height as of 11/17/22: 5' 4"$  (1.626 m).   Weight as of 12/01/22: 169 lb 12.1 oz (77 kg).  LABS: CBC:    Component Value Date/Time   WBC 1.3 (L) 12/08/2022 0820   HGB 7.6 (L) 12/08/2022 0820   HGB 8.0 (L) 06/22/2022 0939   HCT 25.2 (L) 12/08/2022 0820   HCT 26.6 (L) 06/22/2022 0939   PLT 146 (L) 12/08/2022 0820   PLT 77 (LL) 06/22/2022 0939   MCV 96.6 12/08/2022 0820   MCV 93 06/22/2022 0939   NEUTROABS 0.1 (LL) 12/08/2022 0820   NEUTROABS 0.4 (LL) 06/22/2022 0939   LYMPHSABS 0.9 12/08/2022 0820   LYMPHSABS 3.1 06/22/2022 0939   MONOABS 0.2 12/08/2022 0820   EOSABS 0.0 12/08/2022 0820   EOSABS 0.1 06/22/2022 0939   BASOSABS 0.0 12/08/2022 0820   BASOSABS 0.0 06/22/2022 0939   Comprehensive Metabolic Panel:    Component Value Date/Time   NA 137 12/08/2022 0820   NA 143 06/22/2022 0939   K 4.5 12/08/2022 0820   CL 95 (L) 12/08/2022 0820   CO2 37 (H) 12/08/2022 0820   BUN  33 (H) 12/08/2022 0820   BUN 21 06/22/2022 0939   CREATININE 0.60 12/08/2022 0820   CREATININE 0.92 07/07/2018 0955   GLUCOSE 206 (H) 12/08/2022 0820   CALCIUM 10.7 (H) 12/08/2022 0820   AST 16 12/08/2022 0820   ALT 13 12/08/2022 0820   ALKPHOS 113 12/08/2022 0820   BILITOT 0.9 12/08/2022 0820   BILITOT 2.0 (H) 06/22/2022 0939   PROT 4.9 (L) 12/08/2022 0820   PROT 4.9 (L) 06/22/2022 0939   ALBUMIN 3.0 (L) 12/08/2022 0820   ALBUMIN 3.4 (L) 06/22/2022 0939    RADIOGRAPHIC STUDIES:  PERFORMANCE STATUS (ECOG) : 1 - Symptomatic but completely ambulatory  Review of Systems Unless otherwise noted, a complete review of systems is negative.  Physical Exam General: NAD Cardiovascular: regular rate and rhythm Pulmonary: clear ant fields Abdomen: soft, nontender, + bowel sounds GU: no suprapubic tenderness Extremities: no edema, no joint deformities Skin: No vesicles or blisters. No edema, fluctuance, induration or pustules. There is some mild to moderate skin irritation of the left inferior breast. No palpable breast abnormality. No nipple discharge. Neurological: Weakness but otherwise nonfocal  Assessment and Plan- Patient is a 77 y.o. female    Encounter Diagnosis  Name Primary?   Rash and nonspecific skin eruption Yes   New. Slightly poor historian. Suspect dermatitis secondary to tight sports bra +- fungal in nature. Mild- afebrile-not a reason to hold treatment today. Will treat with topical steroid/antifungal cream. Does not appear to be shingles but we reviewed red flags. Will monitor. Last CT in January does not show area of concern however if symptoms fail to improve with cream I would suggest breast imaging.     Patient expressed understanding and was in agreement with this plan. She also understands that She can call clinic at any time with any questions, concerns, or complaints.   Thank you for allowing me to participate in the care of this very pleasant patient.  Time Total: 15  Visit consisted of counseling and education dealing with the complex and emotionally intense issues of symptom management in the setting of serious illness.Greater than 50%  of this time was spent counseling and coordinating care related to the above assessment and plan.  Signed by: Nelwyn Salisbury, PA-C

## 2022-12-09 ENCOUNTER — Inpatient Hospital Stay: Payer: 59

## 2022-12-09 ENCOUNTER — Ambulatory Visit: Payer: Medicaid Other

## 2022-12-09 VITALS — BP 124/61 | HR 77 | Temp 98.8°F | Resp 16

## 2022-12-09 DIAGNOSIS — M47814 Spondylosis without myelopathy or radiculopathy, thoracic region: Secondary | ICD-10-CM | POA: Diagnosis not present

## 2022-12-09 DIAGNOSIS — M5136 Other intervertebral disc degeneration, lumbar region: Secondary | ICD-10-CM | POA: Diagnosis not present

## 2022-12-09 DIAGNOSIS — M7989 Other specified soft tissue disorders: Secondary | ICD-10-CM | POA: Diagnosis not present

## 2022-12-09 DIAGNOSIS — J90 Pleural effusion, not elsewhere classified: Secondary | ICD-10-CM | POA: Diagnosis not present

## 2022-12-09 DIAGNOSIS — D649 Anemia, unspecified: Secondary | ICD-10-CM | POA: Diagnosis not present

## 2022-12-09 DIAGNOSIS — Z79620 Long term (current) use of immunosuppressive biologic: Secondary | ICD-10-CM | POA: Diagnosis not present

## 2022-12-09 DIAGNOSIS — K922 Gastrointestinal hemorrhage, unspecified: Secondary | ICD-10-CM | POA: Diagnosis not present

## 2022-12-09 DIAGNOSIS — M4856XA Collapsed vertebra, not elsewhere classified, lumbar region, initial encounter for fracture: Secondary | ICD-10-CM | POA: Diagnosis not present

## 2022-12-09 DIAGNOSIS — Z794 Long term (current) use of insulin: Secondary | ICD-10-CM | POA: Diagnosis not present

## 2022-12-09 DIAGNOSIS — D6959 Other secondary thrombocytopenia: Secondary | ICD-10-CM | POA: Diagnosis not present

## 2022-12-09 DIAGNOSIS — Z5112 Encounter for antineoplastic immunotherapy: Secondary | ICD-10-CM | POA: Diagnosis not present

## 2022-12-09 DIAGNOSIS — I2510 Atherosclerotic heart disease of native coronary artery without angina pectoris: Secondary | ICD-10-CM | POA: Diagnosis not present

## 2022-12-09 DIAGNOSIS — K8020 Calculus of gallbladder without cholecystitis without obstruction: Secondary | ICD-10-CM | POA: Diagnosis not present

## 2022-12-09 DIAGNOSIS — N644 Mastodynia: Secondary | ICD-10-CM | POA: Diagnosis not present

## 2022-12-09 DIAGNOSIS — R5383 Other fatigue: Secondary | ICD-10-CM | POA: Diagnosis not present

## 2022-12-09 DIAGNOSIS — C8308 Small cell B-cell lymphoma, lymph nodes of multiple sites: Secondary | ICD-10-CM | POA: Diagnosis not present

## 2022-12-09 DIAGNOSIS — R0602 Shortness of breath: Secondary | ICD-10-CM | POA: Diagnosis not present

## 2022-12-09 DIAGNOSIS — C8300 Small cell B-cell lymphoma, unspecified site: Secondary | ICD-10-CM

## 2022-12-09 DIAGNOSIS — J449 Chronic obstructive pulmonary disease, unspecified: Secondary | ICD-10-CM | POA: Diagnosis not present

## 2022-12-09 DIAGNOSIS — R590 Localized enlarged lymph nodes: Secondary | ICD-10-CM | POA: Diagnosis not present

## 2022-12-09 DIAGNOSIS — J948 Other specified pleural conditions: Secondary | ICD-10-CM | POA: Diagnosis not present

## 2022-12-09 DIAGNOSIS — I509 Heart failure, unspecified: Secondary | ICD-10-CM | POA: Diagnosis not present

## 2022-12-09 DIAGNOSIS — E119 Type 2 diabetes mellitus without complications: Secondary | ICD-10-CM | POA: Diagnosis not present

## 2022-12-09 LAB — CYTOLOGY - NON PAP

## 2022-12-09 MED ORDER — SODIUM CHLORIDE 0.9 % IV SOLN
10.0000 mg | Freq: Once | INTRAVENOUS | Status: AC
  Administered 2022-12-09: 10 mg via INTRAVENOUS
  Filled 2022-12-09: qty 1

## 2022-12-09 MED ORDER — SODIUM CHLORIDE 0.9 % IV SOLN
Freq: Once | INTRAVENOUS | Status: AC
  Filled 2022-12-09: qty 250

## 2022-12-09 MED ORDER — SODIUM CHLORIDE 0.9 % IV SOLN
300.0000 mg | Freq: Once | INTRAVENOUS | Status: AC
  Administered 2022-12-09: 300 mg via INTRAVENOUS
  Filled 2022-12-09: qty 30

## 2022-12-09 MED ORDER — ACETAMINOPHEN 325 MG PO TABS
650.0000 mg | ORAL_TABLET | Freq: Once | ORAL | Status: AC
  Administered 2022-12-09: 650 mg via ORAL
  Filled 2022-12-09: qty 2

## 2022-12-09 MED ORDER — DIPHENHYDRAMINE HCL 25 MG PO CAPS
50.0000 mg | ORAL_CAPSULE | Freq: Once | ORAL | Status: AC
  Administered 2022-12-09: 50 mg via ORAL
  Filled 2022-12-09: qty 2

## 2022-12-09 MED ORDER — HEPARIN SOD (PORK) LOCK FLUSH 100 UNIT/ML IV SOLN
500.0000 [IU] | Freq: Once | INTRAVENOUS | Status: AC | PRN
  Administered 2022-12-09: 500 [IU]
  Filled 2022-12-09: qty 5

## 2022-12-11 LAB — BODY FLUID CULTURE W GRAM STAIN
Culture: NO GROWTH
Gram Stain: NONE SEEN

## 2022-12-13 DIAGNOSIS — E039 Hypothyroidism, unspecified: Secondary | ICD-10-CM | POA: Diagnosis not present

## 2022-12-13 DIAGNOSIS — Z6823 Body mass index (BMI) 23.0-23.9, adult: Secondary | ICD-10-CM | POA: Diagnosis not present

## 2022-12-13 DIAGNOSIS — D696 Thrombocytopenia, unspecified: Secondary | ICD-10-CM | POA: Diagnosis not present

## 2022-12-13 DIAGNOSIS — J441 Chronic obstructive pulmonary disease with (acute) exacerbation: Secondary | ICD-10-CM | POA: Diagnosis not present

## 2022-12-13 DIAGNOSIS — Z87891 Personal history of nicotine dependence: Secondary | ICD-10-CM | POA: Diagnosis not present

## 2022-12-13 DIAGNOSIS — I5033 Acute on chronic diastolic (congestive) heart failure: Secondary | ICD-10-CM | POA: Diagnosis not present

## 2022-12-13 DIAGNOSIS — D509 Iron deficiency anemia, unspecified: Secondary | ICD-10-CM | POA: Diagnosis not present

## 2022-12-13 DIAGNOSIS — C884 Extranodal marginal zone B-cell lymphoma of mucosa-associated lymphoid tissue [MALT-lymphoma]: Secondary | ICD-10-CM | POA: Diagnosis not present

## 2022-12-13 DIAGNOSIS — I959 Hypotension, unspecified: Secondary | ICD-10-CM | POA: Diagnosis not present

## 2022-12-13 DIAGNOSIS — J90 Pleural effusion, not elsewhere classified: Secondary | ICD-10-CM | POA: Diagnosis not present

## 2022-12-13 DIAGNOSIS — J9621 Acute and chronic respiratory failure with hypoxia: Secondary | ICD-10-CM | POA: Diagnosis not present

## 2022-12-13 DIAGNOSIS — Z9181 History of falling: Secondary | ICD-10-CM | POA: Diagnosis not present

## 2022-12-13 DIAGNOSIS — Z794 Long term (current) use of insulin: Secondary | ICD-10-CM | POA: Diagnosis not present

## 2022-12-13 DIAGNOSIS — A419 Sepsis, unspecified organism: Secondary | ICD-10-CM | POA: Diagnosis not present

## 2022-12-13 DIAGNOSIS — E119 Type 2 diabetes mellitus without complications: Secondary | ICD-10-CM | POA: Diagnosis not present

## 2022-12-13 DIAGNOSIS — J9622 Acute and chronic respiratory failure with hypercapnia: Secondary | ICD-10-CM | POA: Diagnosis not present

## 2022-12-13 DIAGNOSIS — D61818 Other pancytopenia: Secondary | ICD-10-CM | POA: Diagnosis not present

## 2022-12-14 ENCOUNTER — Inpatient Hospital Stay
Admission: EM | Admit: 2022-12-14 | Discharge: 2022-12-17 | DRG: 603 | Disposition: A | Discharge status: Home Health | Payer: 59 | Attending: Internal Medicine | Admitting: Internal Medicine

## 2022-12-14 ENCOUNTER — Other Ambulatory Visit: Payer: Self-pay

## 2022-12-14 ENCOUNTER — Ambulatory Visit: Payer: Self-pay

## 2022-12-14 ENCOUNTER — Telehealth: Payer: Self-pay

## 2022-12-14 ENCOUNTER — Emergency Department: Payer: 59

## 2022-12-14 ENCOUNTER — Encounter: Payer: Self-pay | Admitting: Internal Medicine

## 2022-12-14 DIAGNOSIS — J910 Malignant pleural effusion: Secondary | ICD-10-CM | POA: Diagnosis not present

## 2022-12-14 DIAGNOSIS — D61818 Other pancytopenia: Secondary | ICD-10-CM

## 2022-12-14 DIAGNOSIS — D696 Thrombocytopenia, unspecified: Secondary | ICD-10-CM | POA: Diagnosis not present

## 2022-12-14 DIAGNOSIS — D849 Immunodeficiency, unspecified: Secondary | ICD-10-CM | POA: Diagnosis present

## 2022-12-14 DIAGNOSIS — I700 Atherosclerosis of aorta: Secondary | ICD-10-CM | POA: Diagnosis present

## 2022-12-14 DIAGNOSIS — Z9181 History of falling: Secondary | ICD-10-CM | POA: Diagnosis not present

## 2022-12-14 DIAGNOSIS — D630 Anemia in neoplastic disease: Secondary | ICD-10-CM | POA: Diagnosis not present

## 2022-12-14 DIAGNOSIS — I959 Hypotension, unspecified: Secondary | ICD-10-CM | POA: Diagnosis not present

## 2022-12-14 DIAGNOSIS — A419 Sepsis, unspecified organism: Secondary | ICD-10-CM | POA: Diagnosis not present

## 2022-12-14 DIAGNOSIS — Z79899 Other long term (current) drug therapy: Secondary | ICD-10-CM

## 2022-12-14 DIAGNOSIS — D6481 Anemia due to antineoplastic chemotherapy: Secondary | ICD-10-CM | POA: Diagnosis not present

## 2022-12-14 DIAGNOSIS — C8598 Non-Hodgkin lymphoma, unspecified, lymph nodes of multiple sites: Secondary | ICD-10-CM | POA: Diagnosis not present

## 2022-12-14 DIAGNOSIS — Z79890 Hormone replacement therapy (postmenopausal): Secondary | ICD-10-CM | POA: Diagnosis not present

## 2022-12-14 DIAGNOSIS — R0602 Shortness of breath: Secondary | ICD-10-CM | POA: Diagnosis not present

## 2022-12-14 DIAGNOSIS — C884 Extranodal marginal zone B-cell lymphoma of mucosa-associated lymphoid tissue [MALT-lymphoma]: Secondary | ICD-10-CM | POA: Diagnosis not present

## 2022-12-14 DIAGNOSIS — Z87891 Personal history of nicotine dependence: Secondary | ICD-10-CM | POA: Diagnosis not present

## 2022-12-14 DIAGNOSIS — E11622 Type 2 diabetes mellitus with other skin ulcer: Secondary | ICD-10-CM | POA: Diagnosis not present

## 2022-12-14 DIAGNOSIS — J9622 Acute and chronic respiratory failure with hypercapnia: Secondary | ICD-10-CM | POA: Diagnosis not present

## 2022-12-14 DIAGNOSIS — R161 Splenomegaly, not elsewhere classified: Secondary | ICD-10-CM | POA: Diagnosis not present

## 2022-12-14 DIAGNOSIS — T451X5A Adverse effect of antineoplastic and immunosuppressive drugs, initial encounter: Secondary | ICD-10-CM | POA: Diagnosis present

## 2022-12-14 DIAGNOSIS — C8510 Unspecified B-cell lymphoma, unspecified site: Secondary | ICD-10-CM | POA: Diagnosis present

## 2022-12-14 DIAGNOSIS — J9611 Chronic respiratory failure with hypoxia: Secondary | ICD-10-CM | POA: Diagnosis not present

## 2022-12-14 DIAGNOSIS — Z743 Need for continuous supervision: Secondary | ICD-10-CM | POA: Diagnosis not present

## 2022-12-14 DIAGNOSIS — R609 Edema, unspecified: Secondary | ICD-10-CM | POA: Diagnosis not present

## 2022-12-14 DIAGNOSIS — L97821 Non-pressure chronic ulcer of other part of left lower leg limited to breakdown of skin: Secondary | ICD-10-CM | POA: Diagnosis not present

## 2022-12-14 DIAGNOSIS — E039 Hypothyroidism, unspecified: Secondary | ICD-10-CM | POA: Diagnosis not present

## 2022-12-14 DIAGNOSIS — J90 Pleural effusion, not elsewhere classified: Secondary | ICD-10-CM | POA: Diagnosis not present

## 2022-12-14 DIAGNOSIS — J9621 Acute and chronic respiratory failure with hypoxia: Secondary | ICD-10-CM | POA: Diagnosis not present

## 2022-12-14 DIAGNOSIS — D709 Neutropenia, unspecified: Secondary | ICD-10-CM | POA: Diagnosis not present

## 2022-12-14 DIAGNOSIS — Z9981 Dependence on supplemental oxygen: Secondary | ICD-10-CM | POA: Diagnosis not present

## 2022-12-14 DIAGNOSIS — Z794 Long term (current) use of insulin: Secondary | ICD-10-CM | POA: Diagnosis not present

## 2022-12-14 DIAGNOSIS — I5033 Acute on chronic diastolic (congestive) heart failure: Secondary | ICD-10-CM | POA: Diagnosis not present

## 2022-12-14 DIAGNOSIS — Z6823 Body mass index (BMI) 23.0-23.9, adult: Secondary | ICD-10-CM | POA: Diagnosis not present

## 2022-12-14 DIAGNOSIS — F17210 Nicotine dependence, cigarettes, uncomplicated: Secondary | ICD-10-CM | POA: Diagnosis not present

## 2022-12-14 DIAGNOSIS — D649 Anemia, unspecified: Secondary | ICD-10-CM | POA: Diagnosis not present

## 2022-12-14 DIAGNOSIS — J811 Chronic pulmonary edema: Secondary | ICD-10-CM | POA: Diagnosis not present

## 2022-12-14 DIAGNOSIS — L03116 Cellulitis of left lower limb: Secondary | ICD-10-CM | POA: Diagnosis not present

## 2022-12-14 DIAGNOSIS — C8590 Non-Hodgkin lymphoma, unspecified, unspecified site: Secondary | ICD-10-CM | POA: Diagnosis not present

## 2022-12-14 DIAGNOSIS — L0390 Cellulitis, unspecified: Secondary | ICD-10-CM

## 2022-12-14 DIAGNOSIS — E119 Type 2 diabetes mellitus without complications: Secondary | ICD-10-CM

## 2022-12-14 DIAGNOSIS — D638 Anemia in other chronic diseases classified elsewhere: Secondary | ICD-10-CM | POA: Diagnosis present

## 2022-12-14 DIAGNOSIS — M79605 Pain in left leg: Secondary | ICD-10-CM | POA: Diagnosis not present

## 2022-12-14 DIAGNOSIS — N1831 Chronic kidney disease, stage 3a: Secondary | ICD-10-CM | POA: Diagnosis not present

## 2022-12-14 DIAGNOSIS — J449 Chronic obstructive pulmonary disease, unspecified: Secondary | ICD-10-CM | POA: Diagnosis present

## 2022-12-14 DIAGNOSIS — R5081 Fever presenting with conditions classified elsewhere: Secondary | ICD-10-CM | POA: Diagnosis present

## 2022-12-14 DIAGNOSIS — J441 Chronic obstructive pulmonary disease with (acute) exacerbation: Secondary | ICD-10-CM | POA: Diagnosis not present

## 2022-12-14 DIAGNOSIS — B0229 Other postherpetic nervous system involvement: Secondary | ICD-10-CM | POA: Diagnosis not present

## 2022-12-14 DIAGNOSIS — R739 Hyperglycemia, unspecified: Secondary | ICD-10-CM | POA: Diagnosis not present

## 2022-12-14 DIAGNOSIS — R062 Wheezing: Secondary | ICD-10-CM | POA: Diagnosis not present

## 2022-12-14 DIAGNOSIS — M7989 Other specified soft tissue disorders: Secondary | ICD-10-CM | POA: Diagnosis not present

## 2022-12-14 DIAGNOSIS — R6889 Other general symptoms and signs: Secondary | ICD-10-CM | POA: Diagnosis not present

## 2022-12-14 DIAGNOSIS — C8300 Small cell B-cell lymphoma, unspecified site: Secondary | ICD-10-CM | POA: Diagnosis present

## 2022-12-14 DIAGNOSIS — D701 Agranulocytosis secondary to cancer chemotherapy: Secondary | ICD-10-CM | POA: Diagnosis not present

## 2022-12-14 DIAGNOSIS — R591 Generalized enlarged lymph nodes: Secondary | ICD-10-CM | POA: Diagnosis not present

## 2022-12-14 DIAGNOSIS — J9811 Atelectasis: Secondary | ICD-10-CM | POA: Diagnosis not present

## 2022-12-14 DIAGNOSIS — D509 Iron deficiency anemia, unspecified: Secondary | ICD-10-CM | POA: Diagnosis not present

## 2022-12-14 LAB — CBC
HCT: 26.2 % — ABNORMAL LOW (ref 36.0–46.0)
Hemoglobin: 7.7 g/dL — ABNORMAL LOW (ref 12.0–15.0)
MCH: 28 pg (ref 26.0–34.0)
MCHC: 29.4 g/dL — ABNORMAL LOW (ref 30.0–36.0)
MCV: 95.3 fL (ref 80.0–100.0)
Platelets: 105 10*3/uL — ABNORMAL LOW (ref 150–400)
RBC: 2.75 MIL/uL — ABNORMAL LOW (ref 3.87–5.11)
RDW: 17.9 % — ABNORMAL HIGH (ref 11.5–15.5)
WBC: 1.4 10*3/uL — CL (ref 4.0–10.5)
nRBC: 0 % (ref 0.0–0.2)

## 2022-12-14 LAB — BASIC METABOLIC PANEL
Anion gap: 9 (ref 5–15)
BUN: 47 mg/dL — ABNORMAL HIGH (ref 8–23)
CO2: 36 mmol/L — ABNORMAL HIGH (ref 22–32)
Calcium: 10.1 mg/dL (ref 8.9–10.3)
Chloride: 98 mmol/L (ref 98–111)
Creatinine, Ser: 0.76 mg/dL (ref 0.44–1.00)
GFR, Estimated: >60 mL/min (ref >60)
Glucose, Bld: 227 mg/dL — ABNORMAL HIGH (ref 70–99)
Potassium: 3.9 mmol/L (ref 3.5–5.1)
Sodium: 143 mmol/L (ref 135–145)

## 2022-12-14 LAB — CBC WITH DIFFERENTIAL/PLATELET
Abs Immature Granulocytes: 0.01 10*3/uL (ref 0.00–0.07)
Basophils Absolute: 0 10*3/uL (ref 0.0–0.1)
Basophils Relative: 0 %
Eosinophils Absolute: 0 10*3/uL (ref 0.0–0.5)
Eosinophils Relative: 4 %
HCT: 24 % — ABNORMAL LOW (ref 36.0–46.0)
Hemoglobin: 7 g/dL — ABNORMAL LOW (ref 12.0–15.0)
Immature Granulocytes: 1 %
Lymphocytes Relative: 71 %
Lymphs Abs: 0.8 10*3/uL (ref 0.7–4.0)
MCH: 27.9 pg (ref 26.0–34.0)
MCHC: 29.2 g/dL — ABNORMAL LOW (ref 30.0–36.0)
MCV: 95.6 fL (ref 80.0–100.0)
Monocytes Absolute: 0.2 10*3/uL (ref 0.1–1.0)
Monocytes Relative: 17 %
Neutro Abs: 0.1 10*3/uL — CL (ref 1.7–7.7)
Neutrophils Relative %: 7 %
Platelets: 110 10*3/uL — ABNORMAL LOW (ref 150–400)
RBC: 2.51 MIL/uL — ABNORMAL LOW (ref 3.87–5.11)
RDW: 17.8 % — ABNORMAL HIGH (ref 11.5–15.5)
Smear Review: NORMAL
WBC: 1.1 10*3/uL — CL (ref 4.0–10.5)
nRBC: 0 % (ref 0.0–0.2)

## 2022-12-14 LAB — LACTIC ACID, PLASMA
Lactic Acid, Venous: 1.7 mmol/L (ref 0.5–1.9)
Lactic Acid, Venous: 1.2 mmol/L (ref 0.5–1.9)

## 2022-12-14 LAB — GLUCOSE, CAPILLARY: Glucose-Capillary: 125 mg/dL — ABNORMAL HIGH (ref 70–99)

## 2022-12-14 MED ORDER — INSULIN ASPART 100 UNIT/ML IJ SOLN
0.0000 [IU] | Freq: Every day | INTRAMUSCULAR | Status: DC
  Filled 2022-12-14: qty 1

## 2022-12-14 MED ORDER — VANCOMYCIN HCL IN DEXTROSE 1-5 GM/200ML-% IV SOLN: 1000.0000 mg | Freq: Once | INTRAVENOUS | Status: DC

## 2022-12-14 MED ORDER — PANTOPRAZOLE SODIUM 40 MG PO TBEC
40.0000 mg | DELAYED_RELEASE_TABLET | Freq: Every day | ORAL | Status: DC
  Administered 2022-12-15 – 2022-12-17 (×3): 40 mg via ORAL
  Filled 2022-12-14 (×3): qty 1

## 2022-12-14 MED ORDER — HEPARIN SODIUM (PORCINE) 5000 UNIT/ML IJ SOLN
5000.0000 [IU] | Freq: Three times a day (TID) | INTRAMUSCULAR | Status: DC
  Administered 2022-12-15 – 2022-12-17 (×6): 5000 [IU] via SUBCUTANEOUS
  Filled 2022-12-14 (×6): qty 1

## 2022-12-14 MED ORDER — ONDANSETRON HCL 4 MG PO TABS: 4.0000 mg | ORAL_TABLET | Freq: Four times a day (QID) | ORAL | Status: DC | PRN

## 2022-12-14 MED ORDER — ONDANSETRON HCL 4 MG/2ML IJ SOLN: 4.0000 mg | Freq: Four times a day (QID) | INTRAMUSCULAR | Status: DC | PRN

## 2022-12-14 MED ORDER — ACETAMINOPHEN 325 MG PO TABS
650.0000 mg | ORAL_TABLET | Freq: Four times a day (QID) | ORAL | Status: DC | PRN
  Administered 2022-12-14: 650 mg via ORAL
  Filled 2022-12-14: qty 2

## 2022-12-14 MED ORDER — ACETAMINOPHEN 650 MG RE SUPP: 650.0000 mg | Freq: Four times a day (QID) | RECTAL | Status: DC | PRN

## 2022-12-14 MED ORDER — VANCOMYCIN HCL 1750 MG/350ML IV SOLN
1750.0000 mg | Freq: Once | INTRAVENOUS | Status: AC
  Administered 2022-12-14: 1750 mg via INTRAVENOUS
  Filled 2022-12-14 (×2): qty 350

## 2022-12-14 MED ORDER — VANCOMYCIN HCL 1250 MG/250ML IV SOLN
1250.0000 mg | INTRAVENOUS | Status: DC
  Administered 2022-12-15 – 2022-12-16 (×2): 1250 mg via INTRAVENOUS
  Filled 2022-12-14 (×3): qty 250

## 2022-12-14 MED ORDER — METRONIDAZOLE 500 MG/100ML IV SOLN
500.0000 mg | Freq: Once | INTRAVENOUS | Status: AC
  Administered 2022-12-14: 500 mg via INTRAVENOUS
  Filled 2022-12-14: qty 100

## 2022-12-14 MED ORDER — MELATONIN 5 MG PO TABS: 5.0000 mg | ORAL_TABLET | Freq: Every evening | ORAL | Status: DC | PRN

## 2022-12-14 MED ORDER — FUROSEMIDE 20 MG PO TABS
20.0000 mg | ORAL_TABLET | Freq: Every day | ORAL | Status: DC
  Administered 2022-12-15 – 2022-12-17 (×3): 20 mg via ORAL
  Filled 2022-12-14 (×3): qty 1

## 2022-12-14 MED ORDER — LEVOTHYROXINE SODIUM 50 MCG PO TABS
50.0000 ug | ORAL_TABLET | Freq: Every day | ORAL | Status: DC
  Administered 2022-12-15 – 2022-12-17 (×3): 50 ug via ORAL
  Filled 2022-12-14 (×3): qty 1

## 2022-12-14 MED ORDER — TENOFOVIR ALAFENAMIDE FUMARATE 25 MG PO TABS
25.0000 mg | ORAL_TABLET | Freq: Every day | ORAL | Status: DC
  Administered 2022-12-15 – 2022-12-17 (×3): 25 mg via ORAL
  Filled 2022-12-14 (×3): qty 30

## 2022-12-14 MED ORDER — ACYCLOVIR 200 MG PO CAPS
400.0000 mg | ORAL_CAPSULE | Freq: Every day | ORAL | Status: DC
  Administered 2022-12-15 – 2022-12-17 (×3): 400 mg via ORAL
  Filled 2022-12-14 (×3): qty 2

## 2022-12-14 MED ORDER — SODIUM CHLORIDE 0.9 % IV SOLN
2.0000 g | Freq: Once | INTRAVENOUS | Status: AC
  Administered 2022-12-14: 2 g via INTRAVENOUS
  Filled 2022-12-14: qty 12.5

## 2022-12-14 MED ORDER — INSULIN ASPART 100 UNIT/ML IJ SOLN
0.0000 [IU] | Freq: Three times a day (TID) | INTRAMUSCULAR | Status: DC
  Administered 2022-12-15 – 2022-12-17 (×5): 3 [IU] via SUBCUTANEOUS
  Administered 2022-12-17: 2 [IU] via SUBCUTANEOUS
  Filled 2022-12-14 (×7): qty 1

## 2022-12-14 MED ORDER — SODIUM CHLORIDE 0.9 % IV SOLN
2.0000 g | Freq: Three times a day (TID) | INTRAVENOUS | Status: DC
  Administered 2022-12-15 – 2022-12-16 (×6): 2 g via INTRAVENOUS
  Filled 2022-12-14 (×3): qty 12.5
  Filled 2022-12-14 (×3): qty 2
  Filled 2022-12-14: qty 12.5

## 2022-12-14 MED ORDER — HYDRALAZINE HCL 20 MG/ML IJ SOLN: 5.0000 mg | Freq: Three times a day (TID) | INTRAMUSCULAR | Status: DC | PRN

## 2022-12-14 MED ORDER — MIDODRINE HCL 5 MG PO TABS
10.0000 mg | ORAL_TABLET | Freq: Three times a day (TID) | ORAL | Status: DC
  Administered 2022-12-15 – 2022-12-17 (×8): 10 mg via ORAL
  Filled 2022-12-14 (×8): qty 2

## 2022-12-14 MED ORDER — IPRATROPIUM-ALBUTEROL 0.5-2.5 (3) MG/3ML IN SOLN
3.0000 mL | Freq: Four times a day (QID) | RESPIRATORY_TRACT | Status: DC | PRN
  Administered 2022-12-15 (×2): 3 mL via RESPIRATORY_TRACT
  Filled 2022-12-14 (×2): qty 3

## 2022-12-14 MED ORDER — SENNOSIDES-DOCUSATE SODIUM 8.6-50 MG PO TABS: 1.0000 | ORAL_TABLET | Freq: Every evening | ORAL | Status: DC | PRN

## 2022-12-14 MED ORDER — NICOTINE 21 MG/24HR TD PT24: 21.0000 mg | MEDICATED_PATCH | Freq: Every day | TRANSDERMAL | Status: DC | PRN

## 2022-12-14 NOTE — Assessment & Plan Note (Signed)
-   Resumed home acyclovir 400 mg daily

## 2022-12-14 NOTE — Assessment & Plan Note (Signed)
-   Insulin SSI with at bedtime coverage ordered °

## 2022-12-14 NOTE — H&P (Signed)
History and Physical   Natasha [name] [id number] DOB: [birth year] DOA: 12/14/2022  PCP: Natasha Lo, NP  Outpatient Specialists: Dr. Janese Banks Patient coming from: home via EMS  I have personally briefly reviewed patient's old medical records in [place].  Chief Concern: leg swelling  HPI: Natasha [name] is a 77 year old female with history of nodal marginal zone B cell lymphoma, COPD, insulin-dependent diabetes mellitus, postherpetic neuralgia, who presents emergency department for chief concerns of left lower leg wound.  Initial vitals in the ED showed temperature of 97.8, respiration rate of 18, heart rate of 74, blood pressure 112/52, SpO2 of 94% on 2 L nasal cannula.  Serum sodium is 143, potassium 2.9, chloride 98, bicarb 36, BUN of 47, serum creatinine of 0.76, none fasting blood glucose 227, EGFR greater than 60, WBC 1.1, hemoglobin 7.0, platelets of 110.  Initial lactic acid was 1.7 and on repeat is 1.2.  ED treatment: Cefepime 2 g IV one-time dose, Flagyl, vancomycin. ------------------------------------ At bedside, she was able to tell me her name, age, current location, current year.   She reports she woke up with her left leg swollen, red, and pain. She denies fever, nausea, vomiting, diarrhea, dysuria, hematuria, syncope, loss of consciousness.  Social history: She lives with her daughter, granddaughters. She denies current tobacco use. She formerly smoked 1/2 ppd and started at age 84. She denies etoh and recreational drug use. She is retired and formerly worked in Control and instrumentation engineer, Health visitor.  ROS: Constitutional: no weight change, no fever ENT/Mouth: no sore throat, no rhinorrhea Eyes: no eye pain, no vision changes Cardiovascular: no chest pain, no dyspnea,  no edema, no palpitations Respiratory: no cough, no sputum, no wheezing Gastrointestinal: no nausea, no vomiting, no diarrhea, no constipation Genitourinary: no urinary incontinence, no dysuria, no  hematuria Musculoskeletal: no arthralgias, no myalgias Skin: + skin lesions, no pruritus, increased redness and bilateral leg swelling Neuro: + weakness, no loss of consciousness, no syncope Psych: no anxiety, no depression, no decrease appetite Heme/Lymph: no bruising, no bleeding  ED Course: Discussed with emergency medicine provider, patient requiring hospitalization for chief concerns of cellulitis in setting of leukopenia.  Assessment/Plan  Principal Problem:   Left leg cellulitis Active Problems:   Nodal marginal zone B-cell lymphoma (HCC)   Type 2 diabetes mellitus without complication, with long-term current use of insulin (HCC)   Hypothyroidism, unspecified   Generalized lymphadenopathy   Post herpetic neuralgia   Lymphoma (HCC)   Chronic obstructive pulmonary disease (COPD) (HCC)   Diabetes mellitus, type II (HCC)   Anemia of chronic disease   Anemia associated with chemotherapy   Assessment and Plan:  * Left leg cellulitis - In setting of lymphoma with chronic leukopenia - Blood cultures x 2 ordered - Continue with cefepime and vancomycin per pharmacy - Admit to telemetry medical, inpatient  Type 2 diabetes mellitus without complication, with long-term current use of insulin (HCC) - Insulin SSI with at bedtime coverage ordered - Goal inpatient blood glucose levels 140-180  Hypothyroidism, unspecified - Levothyroxine 50 mcg daily resumed  Diabetes mellitus, type II (HCC) - Insulin SSI with at bedtime coverage ordered  Chronic obstructive pulmonary disease (COPD) ([place]) - Not in acute exacerbation - Currently requiring 2 L nasal cannula, patient states at baseline she uses 4 L - DuoNebs every 6 hours as needed for shortness of breath and wheezing  Lymphoma ([place]) - Continue outpatient follow-up with oncology  Post herpetic neuralgia - Resumed home acyclovir 400 mg daily  Cigarette  nicotine dependence, uncomplicated-resolved as of 12/14/2022 - As needed  nicotine patch for nicotine craving ordered  Chart reviewed.   DVT prophylaxis: heparin 5000 units, q8h Code Status: full code Diet: carb modified Family Communication: a phone call was offered, patient declined stating that she already call her family to update them Disposition Plan: pending clinical course Consults called: none at this time Admission status: telemetry medicain, inpt  Past Medical History:  Diagnosis Date   Anemia    Cancer ([place])    lymphoma-stomach    Cancer ([place])    leulemia   CHF (congestive heart failure) ([place])    COPD (chronic obstructive pulmonary disease) ([place])    Diabetes mellitus without complication ([place])    Hypotension    Pleural effusion    [facility] 855m,  2 weeks ago   Vaginal delivery    x 5   Past Surgical History:  Procedure Laterality Date   IR IMAGING GUIDED PORT INSERTION  08/16/2022   TONSILLECTOMY Bilateral    as a child   Social History:  reports that she quit smoking about 4 months ago. Her smoking use included cigarettes. She started smoking about 61 years ago. She has a 110.00 pack-year smoking history. She has never used smokeless tobacco. She reports that she does not drink alcohol and does not use drugs.  No Known Allergies Family History  Problem Relation Age of Onset   Cancer Mother    Breast cancer Mother    Cancer Father    Alzheimer's disease Father    Lung cancer Father        lung   Varicose Veins Brother    Heart attack Brother    Family history: Family history reviewed and not pertinent.  Prior to Admission medications   Medication Sig Start Date End Date Taking? Authorizing Provider  acyclovir (ZOVIRAX) 400 MG tablet Take 400 mg by mouth daily. 08/09/22   [provider]  albuterol (VENTOLIN HFA) 108 (90 Base) MCG/ACT inhaler TAKE 2 PUFFS BY MOUTH EVERY 6 HOURS AS NEEDED FOR WHEEZE OR SHORTNESS OF BREATH Patient taking differently: Inhale 2 puffs into the lungs every 6 (six) hours as needed for  wheezing or shortness of breath. TAKE 2 PUFFS BY MOUTH EVERY 6 HOURS AS NEEDED FOR WHEEZE OR SHORTNESS OF BREATH 06/17/22   SRalene MuskratB, MD  amoxicillin-clavulanate (AUGMENTIN) 875-125 MG tablet Take 1 tablet by mouth 2 (two) times daily. 11/17/22   RSindy Guadeloupe MD  clobetasol cream (TEMOVATE) 0AB-123456789% Apply 1 Application topically daily. 08/25/22   [provider]  furosemide (LASIX) 20 MG tablet Take 1 tablet (20 mg total) by mouth daily. 09/09/22   HAlisa Graff FNP  insulin NPH-regular Human (70-30) 100 UNIT/ML injection Inject 4 Units into the skin 2 (two) times daily with a meal. Use only for hyperglycemia, CBG+ 300. Start with 5 units, titrate up to max 10 unit per dose if need. Patient not taking: Reported on 12/01/2022 11/12/22   WLoletha Grayer MD  ipratropium-albuterol (DUONEB) 0.5-2.5 (3) MG/3ML SOLN Take 3 mLs by nebulization every 6 (six) hours as needed (shortness of breath or wheezing). 08/22/22   [provider]  lenalidomide (REVLIMID) 10 MG capsule Take 1 capsule (10 mg total) by mouth daily. Take for 14 days, then hold for 7 days. 11/16/22   RSindy Guadeloupe MD  levothyroxine (SYNTHROID) 50 MCG tablet Take 1 tablet (50 mcg total) by mouth daily at 6 (six) AM. 07/17/22   ZSharen Hones MD  loperamide (IMODIUM) 2 MG capsule Take 2 mg by mouth as needed for diarrhea or loose stools.    [provider]  midodrine (PROAMATINE) 10 MG tablet Take 1 tablet (10 mg total) by mouth 3 (three) times daily with meals. 10/28/22   Jennye Boroughs, MD  nystatin-triamcinolone ointment [facility]) Apply 1 Application topically 2 (two) times daily. 12/08/22   Hughie Closs, PA-C  ondansetron (ZOFRAN) 8 MG tablet Take 8 mg by mouth every 8 (eight) hours as needed for nausea, vomiting or refractory nausea / vomiting. Take 1 tablet q6H for 4 days after chemotherapy. Patient not taking: Reported on 12/01/2022 08/09/22   [provider]  pantoprazole (PROTONIX) 40 MG  tablet Take 1 tablet (40 mg total) by mouth daily. Patient not taking: Reported on 12/01/2022 11/12/22 12/12/22  Loletha Grayer, MD  prochlorperazine (COMPAZINE) 10 MG tablet Take 1 tablet by mouth every 6 (six) hours as needed for nausea or vomiting. Take 1 tab q6H for 4 days after chemotherapy. Patient not taking: Reported on 12/01/2022 08/09/22   [provider]  simethicone (MYLICON) 80 MG chewable tablet Chew 180 mg by mouth every 6 (six) hours as needed for flatulence. Take 1 capsule PO PRN gas relief.    [provider]  Tenofovir Alafenamide Fumarate (VEMLIDY) 25 MG TABS Take 1 tablet (25 mg total) by mouth daily. 11/26/22   Sindy Guadeloupe, MD   Physical Exam: Vitals:   12/14/22 1419 12/14/22 1813 12/14/22 2104  BP: (!) 108/49 (!) 112/52 (!) 117/97  Pulse: 72 74 77  Resp: 17 18 18  $ Temp: (!) 97.5 F (36.4 C) 97.8 F (36.6 C) 97.6 F (36.4 C)  TempSrc: Oral    SpO2: 92% 94% 95%   Constitutional: appears age-appropriate, frail, NAD, calm, comfortable Eyes: PERRL, lids and conjunctivae normal ENMT: Mucous membranes are moist. Posterior pharynx clear of any exudate or lesions. Age-appropriate dentition. Hearing appropriate Neck: normal, supple, no masses, no thyromegaly Respiratory: clear to auscultation bilaterally, no wheezing, no crackles. Normal respiratory effort. No accessory muscle use.  Cardiovascular: Regular rate and rhythm, no murmurs / rubs / gallops. No extremity edema. 2+ pedal pulses. No carotid bruits.  Abdomen: no tenderness, no masses palpated, no hepatosplenomegaly. Bowel sounds positive.  Musculoskeletal: no clubbing / cyanosis. No joint deformity upper and lower extremities. Good ROM, no contractures, no atrophy. Normal muscle tone.  Skin: no rashes, lesions, ulcers. No induration Neurologic: Sensation intact. Strength 5/5 in all 4.  Psychiatric: Normal judgment and insight. Alert and oriented x 3. Normal mood.   EKG: Not indicated at this  time  x-rays on Admission: I personally reviewed and I agree with radiologist reading as below.  DG Tibia/Fibula Left  Result Date: 12/14/2022 CLINICAL DATA:  Leg pain swelling EXAM: LEFT TIBIA AND FIBULA - 2 VIEW COMPARISON:  None Available. FINDINGS: No fracture or malalignment. No soft tissue emphysema. No periostitis or osseous destructive change. 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 Diffuse soft tissue swelling IMPRESSION: No acute osseous abnormality. 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 Electronically Signed   By: Donavan Foil M.D.   On: 12/14/2022 18:34   US Venous Img Lower Unilateral Left  Result Date: 12/14/2022 CLINICAL DATA:  left leg pain EXAM: LEFT LOWER EXTREMITY VENOUS DOPPLER ULTRASOUND TECHNIQUE: Gray-scale sonography with compression, as well as color and duplex ultrasound, were performed to evaluate the deep venous system(s) from the level of the common femoral vein through the popliteal and proximal calf veins. COMPARISON:  None Available. FINDINGS: VENOUS Normal compressibility  of the common femoral, superficial femoral, and popliteal veins, as well as the visualized calf veins. Visualized portions of profunda femoral vein and great saphenous vein unremarkable. No filling defects to suggest DVT on grayscale or color Doppler imaging. Doppler waveforms show normal direction of venous flow, normal respiratory plasticity and response to augmentation. Limited views of the contralateral common femoral vein are unremarkable. OTHER Multiple lymph nodes in the groins noted. The largest measures approximately 4.2 x 1.3 cm in the left groin. IMPRESSION: No evidence of DVT in the left lower extremity. Electronically Signed   By: Margaretha Sheffield M.D.   On: 12/14/2022 18:22    Labs on Admission: I have personally reviewed following labs  CBC: Recent Labs  Lab 12/08/22 0820 12/14/22 1436 12/14/22 1739  WBC 1.3* 1.4* 1.1*  NEUTROABS 0.1*  --  0.1*  HGB 7.6* 7.7* 7.0*  HCT 25.2* 26.2* 24.0*  MCV  96.6 95.3 95.6  PLT 146* 105* [id number]*   Basic Metabolic Panel: Recent Labs  Lab 12/08/22 0820 12/14/22 1436  NA 137 143  K 4.5 3.9  CL 95* 98  CO2 37* 36*  GLUCOSE 206* 227*  BUN 33* 47*  CREATININE 0.60 0.76  CALCIUM 10.7* 10.1   GFR: Estimated Creatinine Clearance: 61.9 mL/min (by C-G formula based on SCr of 0.76 mg/dL).  Liver Function Tests: Recent Labs  Lab 12/08/22 0820  AST 16  ALT 13  ALKPHOS 113  BILITOT 0.9  PROT 4.9*  ALBUMIN 3.0*   Urine analysis:    Component Value Date/Time   COLORURINE YELLOW (A) 11/07/2022 1323   APPEARANCEUR HAZY (A) 11/07/2022 1323   LABSPEC 1.015 11/07/2022 1323   PHURINE 5.0 11/07/2022 1323   GLUCOSEU NEGATIVE 11/07/2022 1323   HGBUR NEGATIVE 11/07/2022 1323   BILIRUBINUR NEGATIVE 11/07/2022 1323   KETONESUR NEGATIVE 11/07/2022 1323   PROTEINUR NEGATIVE 11/07/2022 1323   NITRITE NEGATIVE 11/07/2022 [place] 11/07/2022 1323   This document was prepared using Dragon Voice Recognition software and may include unintentional dictation errors.  Dr. Tobie Poet [facility]  If 7PM-7AM, please contact overnight-coverage provider If 7AM-7PM, please contact day coverage provider [email or web address]  12/14/2022, 10:36 PM

## 2022-12-14 NOTE — Consult Note (Signed)
CODE SEPSIS - PHARMACY COMMUNICATION  **Broad Spectrum Antibiotics should be administered within 1 hour of Sepsis diagnosis**  Time Code Sepsis Called/Page Received: 1708  Antibiotics Ordered: Cefepime, Vancomycin, Flagyl  Time of 1st antibiotic administration: 1804  Additional action taken by pharmacy: Messaged RN   Will M. Ouida Sills, PharmD PGY-1 Pharmacy Resident 12/14/2022 5:12 PM

## 2022-12-14 NOTE — Assessment & Plan Note (Addendum)
-   Not in acute exacerbation - Currently requiring 2 L nasal cannula, patient states at baseline she uses 4 L - DuoNebs every 6 hours as needed for shortness of breath and wheezing

## 2022-12-14 NOTE — Telephone Encounter (Signed)
Patient's granddaughter, Particia Lather, called and patient was with her.  I spoke with patient, but I was having trouble understanding what she needed.  Merry Proud from [facility] was there and he got on the phone [phone]).  Merry Proud states patient has infection in left leg and both legs are swollen, red, and hot to the touch.  Merry Proud states patient's left leg is weeping.  Merry Proud states patient has +3 pitting edema all the way up her thighs. Merry Proud states patient has an open sore on the front part of her left leg and it is green.  Merry Proud states he has called EMS and they are on their way.  Merry Proud will have his office fax [place] the visit note.

## 2022-12-14 NOTE — Patient Outreach (Signed)
  Care Coordination   Follow Up Visit Note   12/14/2022 Name: Najma Wellman Gil MRN: [id number] DOB: 03-09-1946  Prentice Docker Newhouse is a 77 y.o. year old female who sees [name], NP for primary care. I  spoke with daughter, [name].   What matters to the patients health and wellness today?  Daughter states patient is in the ED due to bilateral leg blisters. She states the home health nurse and PT visited  today and nurse evaluated patients legs and recommended ED evaluation.   Daughter states cancer center will provide patient morning transport to infusion/ chemotherapy treatments and she / daughter will pick her up.    Goals Addressed             This Visit's Progress    Patient / caregiver stated:  Management of health conditions       Interventions Today    Flowsheet Row Most Recent Value  Chronic Disease   Chronic disease during today's visit Other  [Bilateral leg blisters]  General Interventions   General Interventions Discussed/Reviewed General Interventions Reviewed  [Assessed for transportation arrangements.  Assessed for home health service start of care.]                    SDOH assessments and interventions completed:  No     Care Coordination Interventions:  Yes, provided   Follow up plan: Follow up call scheduled for 12/23/22    Encounter Outcome:  Pt. Visit Completed   Quinn Plowman RN,BSN,CCM [place] [phone] direct line

## 2022-12-14 NOTE — Telephone Encounter (Signed)
Noted  

## 2022-12-14 NOTE — Consult Note (Signed)
PHARMACY -  BRIEF ANTIBIOTIC NOTE   Pharmacy has received consult(s) for cefepime and vancomycin from an ED provider.  The patient's profile has been reviewed for ht/wt/allergies/indication/available labs.    One time order(s) placed for cefepime 2 g IV and vancomycin 1000 mg IV  Further antibiotics/pharmacy consults should be ordered by admitting physician if indicated.                       Thank you,  Will M. Ouida Sills, PharmD PGY-1 Pharmacy Resident 12/14/2022 5:13 PM

## 2022-12-14 NOTE — Consult Note (Signed)
Pharmacy Antibiotic Note  Natasha [name] is a 77 y.o. female admitted on 12/14/2022 with cellulitis.  Pharmacy has been consulted for cefepime and vancomycin dosing.  Assessment: 77 yo F with PMH B cell lymphoma (current treatment with rituximab and lenalidomide) presents with left leg erythema and ulceration. Ultrasound negative for DVT and Leg CT shows chondrocalcinosis at the knee and diffuse soft tissue swelling. Pt is neutropenic and afebrile. She has received cefepime x 1 and metronidazole x 1 in the ED. Patient's renal function currently near threshold for cefepime dose reduction but still above 60 mL/min.  Vancomycin 1250 mg IV q24H Goal AUC 400-550  Est AUC: 463.7; Cmax: 32.7; Cmin: 11.0 SCr 0.8; IBW; Vd 0.72  Plan: Give vancomycin 1750 mg IV x 1 and initiate vancomycin 1250 mg IV q24H Initiate cefepime 2 g IV q8H Follow up culture results to assess for antibiotic optimization Monitor renal function to assess for any necessary antibiotic dosing changes   Temp (24hrs), Avg:97.7 F (36.5 C), Min:97.5 F (36.4 C), Max:97.8 F (36.6 C)  Recent Labs  Lab 12/08/22 0820 12/14/22 1436 12/14/22 1739  WBC 1.3* 1.4* 1.1*  CREATININE 0.60 0.76  --   LATICACIDVEN  --  1.7 1.2    Estimated Creatinine Clearance: 61.9 mL/min (by C-G formula based on SCr of 0.76 mg/dL).    No Known Allergies  Antimicrobials this admission: Cefepime 2/20 >>  Vancomycin 2/20 >> Metronidazole x 1  Dose adjustments this admission: N/A  Microbiology results: 2/20 BCx: sent  Thank you for allowing pharmacy to be a part of this patient's care.  Will M. Ouida Sills, PharmD PGY-1 Pharmacy Resident 12/14/2022 7:49 PM

## 2022-12-14 NOTE — Assessment & Plan Note (Signed)
-   In setting of lymphoma with chronic leukopenia - Blood cultures x 2 ordered - Continue with cefepime and vancomycin per pharmacy - Admit to telemetry medical, inpatient

## 2022-12-14 NOTE — ED Provider Notes (Signed)
[facility] Provider Note    Event Date/Time   First MD Initiated Contact with Patient 12/14/22 1620     (approximate)   History   cellulitis   HPI  Natasha [name] is a 77 y.o. female with B cell lymphoma followed by Dr. Janese Banks. The left leg has erythema noted to it and a ulcer noted on it that she reports started last night. She has some swelling in the right leg but denies any swelling. No fevers. Denies any falls.  On 2/14 pt seen by onoclogy- where they noted normal legs, no edema.    Current Treatment: Rituxan and Revlimid with weekly Rituxan for 4 cycles followed by Rituxan Revlimid starting cycle 2 q. 28 days     Physical Exam   Triage Vital Signs: ED Triage Vitals  Enc Vitals Group     BP 12/14/22 1419 (!) 108/49     Pulse Rate 12/14/22 1419 72     Resp 12/14/22 1419 17     Temp 12/14/22 1419 (!) 97.5 F (36.4 C)     Temp Source 12/14/22 1419 Oral     SpO2 12/14/22 1419 92 %     Weight --      Height --      Head Circumference --      Peak Flow --      Pain Score 12/14/22 1357 4     Pain Loc --      Pain Edu? --      Excl. in [place]? --     Most recent vital signs: Vitals:   12/14/22 1419  BP: (!) 108/49  Pulse: 72  Resp: 17  Temp: (!) 97.5 F (36.4 C)  SpO2: 92%     General: Awake, no distress.  CV:  Good peripheral perfusion.  Resp:  Normal effort.  Abd:  No distention.  Other:  Patient has redness noted on the left leg going up to just below the knee.  Able to flex and extend the ankle without any pain.  No involvement of the knee.  No discolored black skin.  No crepitus does have a about a quarter sized ulcer noted on the leg.    ED Results / Procedures / Treatments   Labs (all labs ordered are listed, but only abnormal results are displayed) Labs Reviewed  CBC - Abnormal; Notable for the following components:      Result Value   WBC 1.4 (*)    RBC 2.75 (*)    Hemoglobin 7.7 (*)    HCT 26.2 (*)    MCHC 29.4 (*)     RDW 17.9 (*)    Platelets 105 (*)    All other components within normal limits  BASIC METABOLIC PANEL - Abnormal; Notable for the following components:   CO2 36 (*)    Glucose, Bld 227 (*)    BUN 47 (*)    All other components within normal limits  LACTIC ACID, PLASMA  LACTIC ACID, PLASMA      RADIOLOGY I have reviewed the xray personally and interpreted no gas noted   PROCEDURES:  Critical Care performed: No  .1-3 Lead EKG Interpretation  Performed by: Vanessa Adrian, MD Authorized by: Vanessa [place], MD     Interpretation: normal     ECG rate:  70   ECG rate assessment: normal     Rhythm: sinus rhythm     Ectopy: none     Conduction: normal   .Critical  Care  Performed by: Vanessa Iva, MD Authorized by: Vanessa [place], MD   Critical care provider statement:    Critical care time (minutes):  30   Critical care was necessary to treat or prevent imminent or life-threatening deterioration of the following conditions: neutropenic- cellulitis.   Critical care was time spent personally by me on the following activities:  Development of treatment plan with patient or surrogate, discussions with consultants, evaluation of patient's response to treatment, examination of patient, ordering and review of laboratory studies, ordering and review of radiographic studies, ordering and performing treatments and interventions, pulse oximetry, re-evaluation of patient's condition and review of old [place] ED: Medications  metroNIDAZOLE (FLAGYL) IVPB 500 mg (500 mg Intravenous New Bag/Given 12/14/22 1857)  vancomycin (VANCOCIN) IVPB 1000 mg/200 mL premix (has no administration in time range)  heparin injection 5,000 Units (has no administration in time range)  senna-docusate (Senokot-S) tablet 1 tablet (has no administration in time range)  ondansetron (ZOFRAN) tablet 4 mg (has no administration in time range)    Or  ondansetron (ZOFRAN) injection 4 mg (has no  administration in time range)  acetaminophen (TYLENOL) tablet 650 mg (has no administration in time range)    Or  acetaminophen (TYLENOL) suppository 650 mg (has no administration in time range)  ceFEPIme (MAXIPIME) 2 g in sodium chloride 0.9 % 100 mL IVPB (0 g Intravenous Stopped 12/14/22 1842)     IMPRESSION / MDM / [place] / ED COURSE  I reviewed the triage vital signs and the nursing notes.   Patient's presentation is most consistent with acute presentation with potential threat to life or bodily function.   Differential: cellulitis, drug reaction, chronic venous stasis.  Does not appear to be necrotizing fasciitis.  Patient is afebrile and otherwise well-appearing but given she is immunosuppressed with low BC CBC will cover with Vanco and cefepime.  Examination most concerning for cellulitis.  Patient has good distal pulse.  Given edema in her legs and patient is not hypotensive I will hold off on fluid resuscitation.  Lactate was normal CBC low white count similar to 6 days ago.  BMP shows stable creatinine.  X-ray, DVT ultrasound are reassuring.  Given patient is neutropenic although she does not have neutropenic fever I have covered her broadly with broad-spectrum antibiotics and I have discussed with the hospital team for admission    The patient is on the cardiac monitor to evaluate for evidence of arrhythmia and/or significant heart rate changes.      FINAL CLINICAL IMPRESSION(S) / ED DIAGNOSES   Final diagnoses:  Neutropenia, unspecified type ([place])  Cellulitis, unspecified cellulitis site     Rx / DC Orders   ED Discharge Orders     None        Note:  This document was prepared using Dragon voice recognition software and may include unintentional dictation errors.   Vanessa [place], MD 12/14/22 [phone]

## 2022-12-14 NOTE — Hospital Course (Addendum)
Natasha [name] is a 77 year old female with history of nodal marginal zone B cell lymphoma, chronic leukopenia, COPD on 4 L of oxygen, insulin-dependent diabetes mellitus, postherpetic neuralgia, who presents emergency department for chief concerns of left lower leg wound, along with pain and swelling.  No fever.  Initial vitals in the ED showed temperature of 97.8, respiration rate of 18, heart rate of 74, blood pressure 112/52, SpO2 of 94% on 2 L nasal cannula.  Serum sodium is 143, potassium 2.9, chloride 98, bicarb 36, BUN of 47, serum creatinine of 0.76, none fasting blood glucose 227, EGFR greater than 60, WBC 1.1, hemoglobin 7.0, platelets of 110.  Initial lactic acid was 1.7 and on repeat is 1.2.  ED treatment: Cefepime 2 g IV one-time dose, Flagyl, vancomycin.  2/21: Vitals normal.  Hemoglobin decreased to 6.6 with a decrease of WBC to 0.9, all cell lines decreased.  Ordered 2 unit of irradiated PRBC.  Oncology was also consulted today. 2/22: Vital stable.  Hemoglobin improved to 9.2 after getting 2 unit of PRBC.  WBC at 1.4 this morning.  Overnight shortness of breath after getting blood and additional fluid-one-time 20 mg of Lasix given.  Saturating well on her baseline oxygen requirement of 4 L.  BNP at 256. Patient is worried about missing her treatment for lymphoma.  ID was consulted to help with the choice and duration of antibiotic for cellulitis based on her immunocompromise status. Ordered PT/OT evaluation for discharge support.  2/22: Vitals and labs stable around her baseline, WBC 1.4, hemoglobin 9.2 and platelet 114. ID evaluated her and recommending switching to Keflex for discharge for total of 7-day antibiotics. Patient received cefepime and vancomycin while in the hospital due to her immunocompromise status.  She should use compression stockings and keep lower extremity elevated while sitting.  She will continue with rest of her home medications and planning to continue  chemotherapy.  Patient will follow-up with her providers for further recommendations.

## 2022-12-14 NOTE — Assessment & Plan Note (Signed)
-   Levothyroxine 50 mcg daily resumed ?

## 2022-12-14 NOTE — ED Triage Notes (Signed)
Pt comes via EMs from home with c/o cellulitis. Pt has swelling and edema to legs. Legs are wheeping. VSS  Pt was given 1 duo neb for some wheezing. Pt all clear now.  [id number]

## 2022-12-14 NOTE — Assessment & Plan Note (Signed)
-   Insulin SSI with at bedtime coverage ordered ?- Goal inpatient blood glucose levels 140-180 ?

## 2022-12-14 NOTE — Assessment & Plan Note (Signed)
Patient with history of nodal marginal zone B-cell lymphoma. Responsible for pancytopenia-prior oncology not a candidate for Neupogen -Oncology will follow here

## 2022-12-14 NOTE — Assessment & Plan Note (Signed)
-   As needed nicotine patch for nicotine craving ordered

## 2022-12-15 ENCOUNTER — Inpatient Hospital Stay: Payer: 59

## 2022-12-15 ENCOUNTER — Inpatient Hospital Stay: Payer: 59 | Admitting: Oncology

## 2022-12-15 ENCOUNTER — Other Ambulatory Visit: Payer: Self-pay

## 2022-12-15 DIAGNOSIS — L03116 Cellulitis of left lower limb: Secondary | ICD-10-CM

## 2022-12-15 DIAGNOSIS — D649 Anemia, unspecified: Secondary | ICD-10-CM | POA: Diagnosis not present

## 2022-12-15 DIAGNOSIS — C8590 Non-Hodgkin lymphoma, unspecified, unspecified site: Secondary | ICD-10-CM

## 2022-12-15 DIAGNOSIS — T451X5A Adverse effect of antineoplastic and immunosuppressive drugs, initial encounter: Secondary | ICD-10-CM

## 2022-12-15 DIAGNOSIS — E039 Hypothyroidism, unspecified: Secondary | ICD-10-CM | POA: Diagnosis not present

## 2022-12-15 DIAGNOSIS — D701 Agranulocytosis secondary to cancer chemotherapy: Secondary | ICD-10-CM

## 2022-12-15 DIAGNOSIS — Z794 Long term (current) use of insulin: Secondary | ICD-10-CM

## 2022-12-15 DIAGNOSIS — E119 Type 2 diabetes mellitus without complications: Secondary | ICD-10-CM | POA: Diagnosis not present

## 2022-12-15 LAB — GLUCOSE, CAPILLARY
Glucose-Capillary: 153 mg/dL — ABNORMAL HIGH (ref 70–99)
Glucose-Capillary: 198 mg/dL — ABNORMAL HIGH (ref 70–99)
Glucose-Capillary: 165 mg/dL — ABNORMAL HIGH (ref 70–99)
Glucose-Capillary: 152 mg/dL — ABNORMAL HIGH (ref 70–99)

## 2022-12-15 LAB — CBC
HCT: 22.4 % — ABNORMAL LOW (ref 36.0–46.0)
HCT: 30.3 % — ABNORMAL LOW (ref 36.0–46.0)
Hemoglobin: 6.6 g/dL — ABNORMAL LOW (ref 12.0–15.0)
Hemoglobin: 9.3 g/dL — ABNORMAL LOW (ref 12.0–15.0)
MCH: 28.1 pg (ref 26.0–34.0)
MCH: 29 pg (ref 26.0–34.0)
MCHC: 29.5 g/dL — ABNORMAL LOW (ref 30.0–36.0)
MCHC: 30.7 g/dL (ref 30.0–36.0)
MCV: 95.3 fL (ref 80.0–100.0)
MCV: 94.4 fL (ref 80.0–100.0)
Platelets: 105 10*3/uL — ABNORMAL LOW (ref 150–400)
Platelets: 125 10*3/uL — ABNORMAL LOW (ref 150–400)
RBC: 2.35 MIL/uL — ABNORMAL LOW (ref 3.87–5.11)
RBC: 3.21 MIL/uL — ABNORMAL LOW (ref 3.87–5.11)
RDW: 17.8 % — ABNORMAL HIGH (ref 11.5–15.5)
RDW: 17.9 % — ABNORMAL HIGH (ref 11.5–15.5)
WBC: 0.9 10*3/uL — CL (ref 4.0–10.5)
WBC: 1.9 10*3/uL — ABNORMAL LOW (ref 4.0–10.5)
nRBC: 0 % (ref 0.0–0.2)
nRBC: 0 % (ref 0.0–0.2)

## 2022-12-15 LAB — PREPARE RBC (CROSSMATCH)

## 2022-12-15 LAB — BASIC METABOLIC PANEL
Anion gap: 8 (ref 5–15)
BUN: 46 mg/dL — ABNORMAL HIGH (ref 8–23)
CO2: 34 mmol/L — ABNORMAL HIGH (ref 22–32)
Calcium: 9.5 mg/dL (ref 8.9–10.3)
Chloride: 99 mmol/L (ref 98–111)
Creatinine, Ser: 0.79 mg/dL (ref 0.44–1.00)
GFR, Estimated: >60 mL/min (ref >60)
Glucose, Bld: 224 mg/dL — ABNORMAL HIGH (ref 70–99)
Potassium: 3.8 mmol/L (ref 3.5–5.1)
Sodium: 141 mmol/L (ref 135–145)

## 2022-12-15 MED ORDER — TRAMADOL HCL 50 MG PO TABS
50.0000 mg | ORAL_TABLET | Freq: Four times a day (QID) | ORAL | Status: DC | PRN
  Administered 2022-12-15 – 2022-12-17 (×4): 50 mg via ORAL
  Filled 2022-12-15 (×4): qty 1

## 2022-12-15 MED ORDER — GUAIFENESIN-DM 100-10 MG/5ML PO SYRP
5.0000 mL | ORAL_SOLUTION | ORAL | Status: DC | PRN
  Administered 2022-12-15: 5 mL via ORAL
  Filled 2022-12-15: qty 10

## 2022-12-15 MED ORDER — SODIUM CHLORIDE 0.9% IV SOLUTION: Freq: Once | INTRAVENOUS | Status: DC

## 2022-12-15 MED ORDER — FUROSEMIDE 10 MG/ML IJ SOLN
20.0000 mg | Freq: Once | INTRAMUSCULAR | Status: AC
  Administered 2022-12-15: 20 mg via INTRAVENOUS
  Filled 2022-12-15: qty 4

## 2022-12-15 NOTE — Assessment & Plan Note (Addendum)
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  History of pancytopenia.  Hemoglobin decreased to 6.6 today. -Ordered 2 unit of irradiated PRBC -Monitor hemoglobin -Transfuse below 7

## 2022-12-15 NOTE — Assessment & Plan Note (Signed)
Secondary to lymphoma.  Patient with severe neutropenia and leukopenia which are part of her pancytopenia.  Per oncology this is due to her lymphoma and she is not a candidate for Neupogen. -Continue to monitor -Continue with supportive care

## 2022-12-15 NOTE — Plan of Care (Signed)
  Problem: Coping: Goal: Ability to adjust to condition or change in health will improve Outcome: Progressing   

## 2022-12-15 NOTE — Consult Note (Signed)
Hematology/Oncology Consult note [facility] [phone] [phone]  Patient Care Team: [name], NP as PCP - General (Nurse Practitioner) [name], RN (Inactive) as Case Manager [name], RN as [place] [facility]   Name of the patient: Natasha [name]  [id number]  08/03/1946    Reason for pancytopenia admitted for left lower extremity cellulitis   Requesting physician:DR. Amin  Date of visit: 12/15/2022    History of presenting illness-Patient is a 77 year old female with a past medical history significant for COPD, type 2 diabetes, postherpetic neuralgia who was referred for pancytopenia and was eventually found to have low-grade B-cell lymphoma likely nodal marginal zone lymphoma with extensive adenopathy both above and below the diaphragm, pleural effusion secondary to lymphoma as well as significant splenomegaly.  She was started on acalabrutinib as her initial treatment in November 2023 but despite 3 months of treatment there was not a significant response and therefore patient was switched to Rituxan weekly followed by Rituxan Revlimid.  Patient is only received weekly cycles of Rituxan so far.  Her treatment has been complicated by recurrent hospitalization for upper respiratory infections or acute hypoxic respiratory failure secondary to her pleural effusions.  This time she has been admitted for left lower extremity cellulitis.  Patient has had chronic bilateral lower extremity edema likely secondary to her inguinal adenopathy, baseline lymphedema as well as hypoalbuminemia.  She is currently on IV antibiotics for her left lower extremity cellulitis  ECOG PS- 3  Pain scale- 3   Review of systems- Review of Systems  Constitutional:  Positive for malaise/fatigue.  Cardiovascular:  Positive for leg swelling.    No Known Allergies  Patient Active Problem List   Diagnosis Date Noted   Left leg  cellulitis 12/14/2022   Acute GI bleeding 11/12/2022   Acute blood loss anemia 11/11/2022   Hemorrhagic shock ([place]) 11/07/2022   Overweight (BMI 25.0-29.9) 10/21/2022   AKI (acute kidney injury) ([place]) 10/21/2022   Anemia associated with chemotherapy 10/21/2022   Influenza A with pneumonia 10/20/2022   Neutropenia ([place]) 10/20/2022   Myocardial injury 10/20/2022   Type II diabetes mellitus with renal manifestations ([place]) 10/20/2022   Iron deficiency anemia 09/19/2022   COPD with acute exacerbation ([place]) 08/29/2022   Type 2 diabetes mellitus without complication, with long-term current use of insulin ([place]) 08/29/2022   Normocytic anemia 08/29/2022   Severe sepsis ([place]) 08/29/2022   Chronic respiratory failure with hypoxia ([place]) 08/16/2022   Nodal marginal zone B-cell lymphoma ([place]) 07/28/2022   Swelling of lower extremity 07/24/2022   Hypotension 07/24/2022   Palliative care encounter    COVID-19 virus infection 07/14/2022   Anemia of chronic disease 07/14/2022   Thrombocytopenia ([place]) 07/14/2022   Hypothyroidism, unspecified 07/14/2022   Chronic kidney disease, stage 3a ([place]) 07/14/2022   Bilateral pleural effusion 07/12/2022   Diabetes mellitus, type II ([place]) 07/12/2022   Acute on chronic respiratory failure with hypoxia and hypercarbia ([place]) 07/12/2022   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/12/2022   Lower extremity edema 05/12/2022   Other pancytopenia ([place]) 04/16/2022   Type 2 diabetes mellitus with proteinuria ([place]) 04/09/2022   Atherosclerosis of aorta ([place]) 04/09/2022   Chronic obstructive pulmonary disease (COPD) ([place]) 04/09/2022   SOB (shortness of breath) on exertion 04/09/2022   Acute cough 04/09/2022   Post herpetic neuralgia 03/14/2020   Gall stones 10/23/2019   Lymphoma ([place]) 10/23/2019   Generalized lymphadenopathy 09/08/2018   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[place]) 09/08/2018  Primary osteoarthritis of both knees 07/07/2018   Senile purpura ([place]) 07/07/2018      Past Medical History:  Diagnosis Date   Anemia    Cancer ([place])    lymphoma-stomach    Cancer ([place])    leulemia   CHF (congestive heart failure) (HCC)    COPD (chronic obstructive pulmonary disease) (HCC)    Diabetes mellitus without complication (HCC)    Hypotension    Pleural effusion    ARMc 832m,  2 weeks ago   Vaginal delivery    x 5     Past Surgical History:  Procedure Laterality Date   IR IMAGING GUIDED PORT INSERTION  08/16/2022   TONSILLECTOMY Bilateral    as a child    Social History   Socioeconomic History   Marital status: Widowed    Spouse name: Not on file   Number of children: 2   Years of education: Not on file   Highest education level: 9th grade  Occupational History   Occupation: unemployed  Tobacco Use   Smoking status: Former    Packs/day: 2.00    Years: 55.00    Total pack years: 110.00    Types: Cigarettes    Start date: 07/07/1961    Quit date: 08/02/2022    Years since quitting: 0.3   Smokeless tobacco: Never   Tobacco comments:    1/2 pack she states but family says 2 PPD  Vaping Use   Vaping Use: Never used  Substance and Sexual Activity   Alcohol use: No   Drug use: Never   Sexual activity: Not Currently    Partners: Male    Birth control/protection: Post-menopausal  Other Topics Concern   Not on file  Social History Narrative   08/14/20   [place]  Living: to be near daughter   Work: retired      FPhysiological scientistchildren - JIT consultant(nearby) and son JEvelena Peat[place]  8 grandchildren, & 2 great-grandchildren.      Enjoys: stays at home      Exercise: walking to her daughter's store   Diet: eats fruit, air fried chicken      Safety   Seat belts: Yes    Guns: No   Safe in relationships: Yes    Social Determinants of Health   Financial Resource Strain: Medium Risk (08/24/2022)   Overall Financial Resource Strain (CARDIA)    Difficulty of Paying Living Expenses: Somewhat hard  Food Insecurity: No Food Insecurity  (12/15/2022)   Hunger Vital Sign    Worried About Running Out of Food in the Last Year: Never true    Ran Out of Food in the Last Year: Never true  Transportation Needs: Unmet Transportation Needs (12/15/2022)   PRAPARE - Transportation    Lack of Transportation (Medical): Yes    Lack of Transportation (Non-Medical): Yes  Physical Activity: Inactive (08/24/2022)   Exercise Vital Sign    Days of Exercise per Week: 0 days    Minutes of Exercise per Session: 0 min  Stress: Stress Concern Present (08/24/2022)   [place]   Feeling of Stress : To some extent  Social Connections: Socially Isolated (08/24/2022)   Social Connection and Isolation Panel [NHANES]    Frequency of Communication with Friends and Family: More than three times a week    Frequency of Social Gatherings with Friends and Family: More than three times a week    Attends Religious Services: Never  Active Member of Clubs or Organizations: No    Attends Archivist Meetings: Never    Marital Status: Widowed  Intimate Partner Violence: Not At Risk (12/15/2022)   Humiliation, Afraid, Rape, and Kick questionnaire    Fear of Current or Ex-Partner: No    Emotionally Abused: No    Physically Abused: No    Sexually Abused: No     Family History  Problem Relation Age of Onset   Cancer Mother    Breast cancer Mother    Cancer Father    Alzheimer's disease Father    Lung cancer Father        lung   Varicose Veins Brother    Heart attack Brother      Current Facility-Administered Medications:    0.9 %  sodium chloride infusion (Manually program via Guardrails IV Fluids), , Intravenous, Once, Lorella Nimrod, MD   acetaminophen (TYLENOL) tablet 650 mg, 650 mg, Oral, Q6H PRN, 650 mg at 12/14/22 2121 **OR** acetaminophen (TYLENOL) suppository 650 mg, 650 mg, Rectal, Q6H PRN, Cox, Amy N, DO   acyclovir (ZOVIRAX) 200 MG capsule 400 mg, 400 mg, Oral,  Daily, Cox, Amy N, DO, 400 mg at 12/15/22 0853   ceFEPIme (MAXIPIME) 2 g in sodium chloride 0.9 % 100 mL IVPB, 2 g, Intravenous, Q8H, Cox, Amy N, DO, Last Rate: 200 mL/hr at 12/15/22 1723, 2 g at 12/15/22 1723   furosemide (LASIX) tablet 20 mg, 20 mg, Oral, Daily, Cox, Amy N, DO, 20 mg at 12/15/22 0854   guaiFENesin-dextromethorphan (ROBITUSSIN DM) 100-10 MG/5ML syrup 5 mL, 5 mL, Oral, Q4H PRN, Lorella Nimrod, MD, 5 mL at 12/15/22 1022   heparin injection 5,000 Units, 5,000 Units, Subcutaneous, Q8H, Cox, Amy N, DO   hydrALAZINE (APRESOLINE) injection 5 mg, 5 mg, Intravenous, Q8H PRN, Cox, Amy N, DO   insulin aspart (novoLOG) injection 0-15 Units, 0-15 Units, Subcutaneous, TID WC, Cox, Amy N, DO, 3 Units at 12/15/22 1218   insulin aspart (novoLOG) injection 0-5 Units, 0-5 Units, Subcutaneous, QHS, Cox, Amy N, DO   ipratropium-albuterol (DUONEB) 0.5-2.5 (3) MG/3ML nebulizer solution 3 mL, 3 mL, Nebulization, Q6H PRN, Cox, Amy N, DO, 3 mL at 12/15/22 0954   levothyroxine (SYNTHROID) tablet 50 mcg, 50 mcg, Oral, Q0600, Cox, Amy N, DO, 50 mcg at 12/15/22 [id number]   melatonin tablet 5 mg, 5 mg, Oral, QHS PRN, Cox, Amy N, DO   midodrine (PROAMATINE) tablet 10 mg, 10 mg, Oral, TID WC, Cox, Amy N, DO, 10 mg at 12/15/22 1218   nicotine (NICODERM CQ - dosed in mg/24 hours) patch 21 mg, 21 mg, Transdermal, Daily PRN, Cox, Amy N, DO   ondansetron (ZOFRAN) tablet 4 mg, 4 mg, Oral, Q6H PRN **OR** ondansetron (ZOFRAN) injection 4 mg, 4 mg, Intravenous, Q6H PRN, Cox, Amy N, DO   pantoprazole (PROTONIX) EC tablet 40 mg, 40 mg, Oral, Daily, Cox, Amy N, DO, 40 mg at 12/15/22 0853   senna-docusate (Senokot-S) tablet 1 tablet, 1 tablet, Oral, QHS PRN, Cox, Amy N, DO   Tenofovir Alafenamide Fumarate TABS 25 mg, 25 mg, Oral, Daily, Cox, Amy N, DO, 25 mg at 12/15/22 0853   traMADol (ULTRAM) tablet 50 mg, 50 mg, Oral, Q6H PRN, Lorella Nimrod, MD, 50 mg at 12/15/22 1546   vancomycin (VANCOREADY) IVPB 1250 mg/250 mL, 1,250 mg,  Intravenous, Q24H, Cox, Amy N, DO   Physical exam:  Vitals:   12/15/22 1100 12/15/22 1140 12/15/22 1509 12/15/22 1709  BP: 115/70 105/68 128/60 (!) 127/58  Pulse: 82 80 90 85  Resp: 18 18 15 16  $ Temp: 98.1 F (36.7 C) 98.4 F (36.9 C) 98.3 F (36.8 C) 98.4 F (36.9 C)  TempSrc: Oral Oral Oral Oral  SpO2: 92% 95% 96% 95%  Weight:      Height:       Physical Exam Cardiovascular:     Rate and Rhythm: Normal rate and regular rhythm.     Heart sounds: Normal heart sounds.  Pulmonary:     Effort: Pulmonary effort is normal.     Breath sounds: Normal breath sounds.  Abdominal:     General: Bowel sounds are normal.     Palpations: Abdomen is soft.     Comments: Palpable splenomegaly  Musculoskeletal:     Comments: Chronic bilateral lower extremity edema with changes of stasis dermatitis.  There is a superficial ulceration noted over the left leg.  Some local warmth and erythema noted  Lymphadenopathy:     Comments: Palpable bilateral axillary and inguinal adenopathy  Skin:    General: Skin is warm and dry.  Neurological:     Mental Status: She is alert and oriented to person, place, and time.           Latest Ref Rng & Units 12/15/2022    4:01 AM  CMP  Glucose 70 - 99 mg/dL 224   BUN 8 - 23 mg/dL 46   Creatinine 0.44 - 1.00 mg/dL 0.79   Sodium 135 - 145 mmol/L 141   Potassium 3.5 - 5.1 mmol/L 3.8   Chloride 98 - 111 mmol/L 99   CO2 22 - 32 mmol/L 34   Calcium 8.9 - 10.3 mg/dL 9.5       Latest Ref Rng & Units 12/15/2022    4:01 AM  CBC  WBC 4.0 - 10.5 K/uL 0.9   Hemoglobin 12.0 - 15.0 g/dL 6.6   Hematocrit 36.0 - 46.0 % 22.4   Platelets 150 - 400 K/uL 105     @IMAGES$ @  DG Chest 2 View  Result Date: 12/15/2022 CLINICAL DATA:  Shortness of breath, increased EXAM: CHEST - 2 VIEW COMPARISON:  12/07/2022 FINDINGS: RIGHT jugular Port-A-Cath with tip projecting over cavoatrial junction. Enlargement of cardiac silhouette with pulmonary vascular congestion. Diffuse  infiltrates consistent with pulmonary edema. Interval increase in BILATERAL pleural effusions and bibasilar atelectasis. No pneumothorax or acute osseous findings. IMPRESSION: Increased pulmonary edema and bibasilar pleural effusions/atelectasis. Electronically Signed   By: Lavonia Dana M.D.   On: 12/15/2022 16:27   DG Tibia/Fibula Left  Result Date: 12/14/2022 CLINICAL DATA:  Leg pain swelling EXAM: LEFT TIBIA AND FIBULA - 2 VIEW COMPARISON:  None Available. FINDINGS: No fracture or malalignment. No soft tissue emphysema. No periostitis or osseous destructive change. 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 Diffuse soft tissue swelling IMPRESSION: No acute osseous abnormality. 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 Electronically Signed   By: Donavan Foil M.D.   On: 12/14/2022 18:34   US Venous Img Lower Unilateral Left  Result Date: 12/14/2022 CLINICAL DATA:  left leg pain EXAM: LEFT LOWER EXTREMITY VENOUS DOPPLER ULTRASOUND TECHNIQUE: Gray-scale sonography with compression, as well as color and duplex ultrasound, were performed to evaluate the deep venous system(s) from the level of the common femoral vein through the popliteal and proximal calf veins. COMPARISON:  None Available. FINDINGS: VENOUS Normal compressibility of the common femoral, superficial femoral, and popliteal veins, as well as the visualized calf veins. Visualized portions of profunda femoral vein and great saphenous vein unremarkable. No filling defects  to suggest DVT on grayscale or color Doppler imaging. Doppler waveforms show normal direction of venous flow, normal respiratory plasticity and response to augmentation. Limited views of the contralateral common femoral vein are unremarkable. OTHER Multiple lymph nodes in the groins noted. The largest measures approximately 4.2 x 1.3 cm in the left groin. IMPRESSION: No evidence of DVT in the left lower extremity. Electronically Signed   By: Margaretha Sheffield M.D.   On: 12/14/2022 18:22   US  THORACENTESIS ASP PLEURAL SPACE W/IMG GUIDE  Result Date: 12/07/2022 INDICATION: History of gastric lymphoma with recurrent pleural effusions. Request received for diagnostic and therapeutic thoracentesis. Upon ultrasound exam right larger than left. EXAM: ULTRASOUND GUIDED DIAGNOSTIC AND THERAPEUTIC RIGHT THORACENTESIS MEDICATIONS: 10 mL 1 % lidocaine COMPLICATIONS: None immediate. PROCEDURE: An ultrasound guided thoracentesis was thoroughly discussed with the patient and questions answered. The benefits, risks, alternatives and complications were also discussed. The patient understands and wishes to proceed with the procedure. Written consent was obtained. Ultrasound was performed to localize and mark an adequate pocket of fluid in the right chest. The area was then prepped and draped in the normal sterile fashion. 1% Lidocaine was used for local anesthesia. Under ultrasound guidance a 6 Fr Safe-T-Centesis catheter was introduced. Thoracentesis was performed. The catheter was removed and a dressing applied. FINDINGS: A total of approximately 1.2 L of clear, amber fluid was removed. Samples were sent to the laboratory as requested by the clinical team. IMPRESSION: Successful ultrasound guided right thoracentesis yielding 1.2 L of pleural fluid. Read by: Narda Rutherford, AGNP-BC Electronically Signed   By: Albin Felling M.D.   On: 12/07/2022 16:19   DG Chest Port 1 View  Result Date: 12/07/2022 CLINICAL DATA:  Status post right thoracentesis. EXAM: PORTABLE CHEST 1 VIEW COMPARISON:  12/01/2022 and chest CT 11/07/2022 FINDINGS: Improved aeration in the right lower lung compatible with recent thoracentesis. Residual densities at the right lung base compatible with areas of volume loss and pleural fluid. There is a nodular density in the mid right lung which corresponds with the lesion seen on the previous CT. This nodule lesion measures roughly 2.2 cm. Right jugular Port-A-Cath with the tip near the superior  cavoatrial junction. Persistent left basilar densities compatible with pleural fluid and compressive atelectasis. Patchy interstitial densities in both lungs are similar to the previous chest CT. Negative for a pneumothorax. IMPRESSION: 1. Improved aeration in the right lower lung compatible with recent thoracentesis. Negative for a pneumothorax. 2. Residual densities at the lung bases compatible with pleural effusions and compressive atelectasis/consolidation. 3. Nodular density in the mid right lung corresponds with the lesion seen on the previous CT. 4. Persistent patchy interstitial densities in both lungs. Electronically Signed   By: Markus Daft M.D.   On: 12/07/2022 16:04   DG Chest 2 View  Result Date: 12/03/2022 CLINICAL DATA:  Pleural effusions.  Assess for amount of fluid. EXAM: CHEST - 2 VIEW COMPARISON:  11/10/2022.  CT, 11/07/2022. FINDINGS: Moderate left and moderate to large right pleural effusions, right increased from the prior exam, left likely also increased, but to a lesser degree. Central vascular congestion. Hazy perihilar opacity and more confluent lung base opacity. Lung apices are essentially clear. No pneumothorax. Cardiac silhouette is obscured. No gross mediastinal mass. No convincing hilar mass. Right anterior chest wall Port-A-Cath is stable. Skeletal structures are grossly intact. IMPRESSION: 1. Mild interval increase in the size of the right pleural effusion, now moderate to large. Moderate left pleural effusion suspected to be  slightly increased from the recent prior study. 2. Vascular congestion. Central hazy perihilar airspace opacities and more confluent lung base opacities. Lung opacities may reflect atelectasis, edema or a combination, similar in appearance to the prior exam. Electronically Signed   By: Lajean Manes M.D.   On: 12/03/2022 15:58    Assessment and plan- Patient is a 77 y.o. female with history of stage IV nodal marginal zone lymphoma currently on Rituxan  admitted for left lower extremity cellulitis  Left lower extremity cellulitis: She has chronic bilateral lower extremity edema likely secondary to her baseline lymphedema as well as inguinal adenopathy.  Her legs today does not look that much worse as compared to what her baseline is.  There is however some local warmth and erythema and it would be reasonable to continue antibiotics for a week.  Pancytopenia: Secondary to bone marrow involvement from lymphoma.  She has chronic severe neutropenia.  No role for Neupogen at this time.  Hemoglobin 6.6 likely secondary to lymphoma.  Transfuse PRBC irradiated products when hemoglobin is less than 7.  Thrombocytopenia actually has been improving recently.  Continue to monitor   Thank you for this kind referral and the opportunity to participate in the care of this  Patient   Visit Diagnosis 1. Neutropenia, unspecified type ([place])   2. Cellulitis, unspecified cellulitis site     Dr. Randa Evens, MD, MPH [facility] at [facility] [id number] 12/15/2022

## 2022-12-15 NOTE — Plan of Care (Signed)

## 2022-12-15 NOTE — Progress Notes (Signed)
       CROSS COVER NOTE  NAME: Natasha [name] MRN: [id number] DOB : 1946-10-04 ATTENDING PHYSICIAN: Lorella Nimrod, MD    Date of Service   12/15/2022   HPI/Events of Note   Message received from RN reporting M(r)s [place] is dyspneic and wheezing after receiving PRBC and additional 277m of fluid via IV antibiotics. Symptoms minimally improved with PRN duoneb.  Interventions   Assessment/Plan: 20 mg IV lasix      To reach the provider On-Call:   7AM- 7PM see care teams to locate the attending and reach out to them via [email or web address] 7PM-7AM contact night-coverage If you still have difficulty reaching the appropriate provider, please page the [facility](Director on Call) for [facility] on amion for assistance  This document was prepared using [employer] analystand may include unintentional dictation errors.  KNeomia GlassDNP, MBA, FNP-BC, PMHNP-BC Nurse Practitioner [facility] Francis[facility]ager ([phone]

## 2022-12-15 NOTE — Progress Notes (Signed)
Progress Note   Patient: Natasha [name] [id number] DOB: 1946-03-11 DOA: 12/14/2022     1 DOS: the patient was seen and examined on 12/15/2022   Brief hospital course: Ms. 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  No fever.  Initial vitals in the ED showed temperature of 97.8, respiration rate of 18, heart rate of 74, blood pressure 112/52, SpO2 of 94% on 2 L nasal cannula.  Serum sodium is 143, potassium 2.9, chloride 98, bicarb 36, BUN of 47, serum creatinine of 0.76, none fasting blood glucose 227, EGFR greater than 60, WBC 1.1, hemoglobin 7.0, platelets of 110.  Initial lactic acid was 1.7 and on repeat is 1.2.  ED treatment: Cefepime 2 g IV one-time dose, Flagyl, vancomycin.  2/21: Vitals normal.  Hemoglobin decreased to 6.6 with a decrease of WBC to 0.9, all cell lines decreased.  Ordered 2 unit of irradiated PRBC.  Oncology was also consulted today.  Assessment and Plan: * Left leg cellulitis - In setting of lymphoma with chronic leukopenia - Blood cultures negative in 12 hours - Continue with cefepime and vancomycin per pharmacy - Continue with supportive care  Type 2 diabetes mellitus without complication, with long-term current use of insulin (HCC) - Insulin SSI with at bedtime coverage ordered - Goal inpatient blood glucose levels 140-180  Hypothyroidism, unspecified - Levothyroxine 50 mcg daily resumed  Anemia of chronic disease Patient with anemia of chronic disease secondary to lymphoma.  History of pancytopenia.  Hemoglobin decreased to 6.6 today. 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  Patient with severe neutropenia and  leukopenia which are part of her pancytopenia.  Per oncology this is due to her lymphoma and she is not a candidate for Neupogen. -Continue to monitor -Continue with supportive care  Lymphoma [facility]) Patient with history of nodal marginal zone B-cell lymphoma. Responsible for pancytopenia-prior oncology not a candidate for Neupogen -Oncology will follow here  Post herpetic neuralgia - Resumed home acyclovir 400 mg daily  Chronic obstructive pulmonary disease (COPD) ([place]) - Not in acute exacerbation - Currently requiring 2 L nasal cannula, patient states at baseline she uses 4 L - DuoNebs every 6 hours as needed for shortness of breath and wheezing  Diabetes mellitus, type II (HCC) - Insulin SSI with at bedtime coverage ordered  Cigarette nicotine dependence, uncomplicated-resolved as of 12/14/2022 - As needed nicotine patch for nicotine craving ordered   Subjective: Patient was complaining of lower extremity pain.  Wearing compression stockings and does not want them to be removed.  Physical Exam: Vitals:   12/15/22 1002 12/15/22 1100 12/15/22 1140 12/15/22 1509  BP:  115/70 105/68 128/60  Pulse:  82 80 90  Resp:  18 18 15  $ Temp:  98.1 F (36.7 C) 98.4 F (36.9 C) 98.3 F (36.8 C)  TempSrc:  Oral Oral Oral  SpO2: 91% 92% 95% 96%  Weight:      Height:       General.  Frail elderly lady, in no acute distress. Pulmonary.  Lungs clear bilaterally, normal respiratory effort. CV.  Regular rate and rhythm, no JVD, rub or murmur. Abdomen.  Soft, nontender, nondistended, BS positive. CNS.  Alert and oriented .  No focal neurologic deficit.  Extremities.  Trace LE edema,  pulses intact and symmetrical. Psychiatry.  Judgment and insight appears normal.   Data Reviewed: Prior data reviewed  Family Communication: Talked with daughter on phone.  Disposition: Status is: Inpatient Remains inpatient appropriate because: Severity of illness  Planned Discharge Destination: Home with  Home Health  DVT prophylaxis.  Heparin Time spent: 50 minutes  This record has been created using Systems analyst. Errors have been sought and corrected,but may not always be located. Such creation errors do not reflect on the standard of care.   Author: Lorella Nimrod, MD 12/15/2022 3:23 PM  For on call review [email or web address].

## 2022-12-15 NOTE — TOC Progression Note (Signed)
Transition of Care [facility]) - Progression Note    Patient Details  Name: Natasha [name] MRN: [id number] Date of Birth: 20-Jan-1946  [facility]) CM/SW Contact  Laurena Slimmer, RN Phone Number: 12/15/2022, 10:58 PM  Clinical Narrative:    Case reviewed for needs and disposition.  Patient is currently active with [facility].         Expected Discharge Plan and Services                                               Social Determinants of Health (SDOH) Interventions [place]: No Food Insecurity (12/15/2022)  Housing: Low Risk  (12/15/2022)  Transportation Needs: Unmet Transportation Needs (12/15/2022)  Utilities: Not At Risk (12/15/2022)  Recent Concern: Utilities - At Risk (10/06/2022)  Alcohol Screen: Low Risk  (08/24/2022)  Depression (PHQ2-9): Low Risk  (08/24/2022)  Financial Resource Strain: Medium Risk (08/24/2022)  Physical Activity: Inactive (08/24/2022)  Social Connections: Socially Isolated (08/24/2022)  Stress: Stress Concern Present (08/24/2022)  Tobacco Use: Medium Risk (12/14/2022)    Readmission Risk Interventions    11/08/2022   10:53 AM 10/21/2022    1:13 PM 08/17/2022   11:05 AM  Readmission Risk Prevention Plan  Transportation Screening Complete Complete Complete  PCP or Specialist Appt within 3-5 Days   Complete  HRI or [place]   Complete  Social Work Consult for [place] Planning/Counseling   Complete  Palliative Care Screening   Not Applicable  Medication Review Press photographer) Complete Complete Complete  PCP or Specialist appointment within 3-5 days of discharge Complete Complete   HRI or [place] Complete Complete   SW Recovery Care/Counseling Consult Complete Complete   Palliative Care Screening Complete Not [place] Not Applicable Not Applicable

## 2022-12-16 ENCOUNTER — Other Ambulatory Visit (HOSPITAL_COMMUNITY): Payer: Self-pay

## 2022-12-16 ENCOUNTER — Inpatient Hospital Stay: Payer: 59

## 2022-12-16 DIAGNOSIS — E119 Type 2 diabetes mellitus without complications: Secondary | ICD-10-CM | POA: Diagnosis not present

## 2022-12-16 DIAGNOSIS — J910 Malignant pleural effusion: Secondary | ICD-10-CM

## 2022-12-16 DIAGNOSIS — L03116 Cellulitis of left lower limb: Secondary | ICD-10-CM | POA: Diagnosis not present

## 2022-12-16 DIAGNOSIS — E039 Hypothyroidism, unspecified: Secondary | ICD-10-CM | POA: Diagnosis not present

## 2022-12-16 DIAGNOSIS — D61818 Other pancytopenia: Secondary | ICD-10-CM | POA: Diagnosis not present

## 2022-12-16 DIAGNOSIS — C8598 Non-Hodgkin lymphoma, unspecified, lymph nodes of multiple sites: Secondary | ICD-10-CM

## 2022-12-16 DIAGNOSIS — D649 Anemia, unspecified: Secondary | ICD-10-CM | POA: Diagnosis not present

## 2022-12-16 DIAGNOSIS — F17210 Nicotine dependence, cigarettes, uncomplicated: Secondary | ICD-10-CM

## 2022-12-16 LAB — GLUCOSE, CAPILLARY
Glucose-Capillary: 185 mg/dL — ABNORMAL HIGH (ref 70–99)
Glucose-Capillary: 66 mg/dL — ABNORMAL LOW (ref 70–99)
Glucose-Capillary: 89 mg/dL (ref 70–99)
Glucose-Capillary: 193 mg/dL — ABNORMAL HIGH (ref 70–99)

## 2022-12-16 LAB — CBC
HCT: 29.9 % — ABNORMAL LOW (ref 36.0–46.0)
Hemoglobin: 9.2 g/dL — ABNORMAL LOW (ref 12.0–15.0)
MCH: 28.8 pg (ref 26.0–34.0)
MCHC: 30.8 g/dL (ref 30.0–36.0)
MCV: 93.7 fL (ref 80.0–100.0)
Platelets: 117 10*3/uL — ABNORMAL LOW (ref 150–400)
RBC: 3.19 MIL/uL — ABNORMAL LOW (ref 3.87–5.11)
RDW: 18.1 % — ABNORMAL HIGH (ref 11.5–15.5)
WBC: 1.4 10*3/uL — CL (ref 4.0–10.5)
nRBC: 0 % (ref 0.0–0.2)

## 2022-12-16 LAB — TYPE AND SCREEN
ABO/RH(D): O POS
Antibody Screen: NEGATIVE
Unit division: 0
Unit division: 0

## 2022-12-16 LAB — BPAM RBC
Blood Product Expiration Date: 202402282359
Blood Product Expiration Date: 202403152359
ISSUE DATE / TIME: 202402211119
ISSUE DATE / TIME: 202402211712
Unit Type and Rh: 5100
Unit Type and Rh: 9500

## 2022-12-16 LAB — BRAIN NATRIURETIC PEPTIDE: B Natriuretic Peptide: 256.4 pg/mL — ABNORMAL HIGH (ref 0.0–100.0)

## 2022-12-16 MED ORDER — CHLORHEXIDINE GLUCONATE CLOTH 2 % EX PADS
6.0000 | MEDICATED_PAD | Freq: Every day | CUTANEOUS | Status: DC
  Administered 2022-12-16 – 2022-12-17 (×2): 6 via TOPICAL

## 2022-12-16 NOTE — Assessment & Plan Note (Signed)
Secondary to lymphoma.  Patient with severe neutropenia and leukopenia which are part of her pancytopenia.  Per oncology this is due to her lymphoma and she is not a candidate for Neupogen. -Continue to monitor -Continue with supportive care

## 2022-12-16 NOTE — Progress Notes (Signed)
Progress Note   Patient: Natasha [name] Tunnell [id number] DOB: [birth year] DOA: 12/14/2022     2 DOS: the patient was seen and examined on 12/16/2022   Brief hospital course: Ms. 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  No fever.  Initial vitals in the ED showed temperature of 97.8, respiration rate of 18, heart rate of 74, blood pressure 112/52, SpO2 of 94% on 2 L nasal cannula.  Serum sodium is 143, potassium 2.9, chloride 98, bicarb 36, BUN of 47, serum creatinine of 0.76, none fasting blood glucose 227, EGFR greater than 60, WBC 1.1, hemoglobin 7.0, platelets of 110.  Initial lactic acid was 1.7 and on repeat is 1.2.  ED treatment: Cefepime 2 g IV one-time dose, Flagyl, vancomycin.  2/21: Vitals normal.  Hemoglobin decreased to 6.6 with a decrease of WBC to 0.9, all cell lines decreased.  Ordered 2 unit of irradiated PRBC.  Oncology was also consulted today. 2/22: Vital stable.  Hemoglobin improved to 9.2 after getting 2 unit of PRBC.  WBC at 1.4 this morning.  Overnight shortness of breath after getting blood and additional fluid-one-time 20 mg of Lasix given.  Saturating well on her baseline oxygen requirement of 4 L.  BNP at 256. Patient is worried about missing her treatment for lymphoma.  ID was consulted to help with the choice and duration of antibiotic for cellulitis based on her immunocompromise status. Ordered PT/OT evaluation for discharge support  Assessment and Plan: * Left leg cellulitis - In setting of lymphoma with chronic leukopenia - Blood cultures remain negative - Continue with cefepime and vancomycin per pharmacy -ID consult to determine the type and duration of appropriate antibiotic based on her immunocompromise  state. - Continue with supportive care  Type 2 diabetes mellitus without complication, with long-term current use of insulin (HCC) - Insulin SSI with at bedtime coverage ordered - Goal inpatient blood glucose levels 140-180  Hypothyroidism, unspecified - Levothyroxine 50 mcg daily resumed  Anemia of chronic disease Patient with anemia of chronic disease secondary to lymphoma.  History of pancytopenia.  Hemoglobin decreased to 6.6 today. 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  Patient with severe neutropenia and leukopenia which are part of her pancytopenia.  Per oncology this is due to her lymphoma and she is not a candidate for Neupogen. -Continue to monitor -Continue with supportive care  Lymphoma [facility]) Patient with history of nodal marginal zone B-cell lymphoma. Responsible for pancytopenia-prior oncology not a candidate for Neupogen -Oncology will follow here  Post herpetic neuralgia - Resumed home acyclovir 400 mg daily  Chronic obstructive pulmonary disease (COPD) (HCC) - Not in acute exacerbation - Currently requiring 2 L nasal cannula, patient states at baseline she uses 4 L - DuoNebs every 6 hours as needed for shortness of breath and wheezing  Diabetes mellitus, type II (HCC) - Insulin SSI with at bedtime coverage ordered  Cigarette nicotine dependence, uncomplicated-resolved as of 12/14/2022 - As needed nicotine patch for nicotine craving ordered   Subjective: Patient was sitting in chair when seen today.  Continues to have pretty tender legs, more on left today.  She was very concerned about missing her treatment for lymphoma.  Physical Exam: Vitals:   12/15/22 2042  12/15/22 2237 12/15/22 2323 12/16/22 0821  BP: 135/74  (!) 110/56 113/60  Pulse: 83  82 77  Resp: 18  18 18  $ Temp: 98.5 F (36.9 C)  98.5 F (36.9 C) 97.8 F (36.6 C)  TempSrc: Oral     SpO2: 99% 96% 96% 98%   Weight:      Height:       General.  Frail elderly lady, in no acute distress. Pulmonary.  Lungs clear bilaterally, normal respiratory effort. CV.  Regular rate and rhythm, no JVD, rub or murmur. Abdomen.  Soft, nontender, nondistended, BS positive. CNS.  Alert and oriented .  No focal neurologic deficit. Extremities.  1+ LE edema, bilateral erythema.  Patient does not want me to remove compression stockings Psychiatry.  Judgment and insight appears normal.    Data Reviewed: Prior data reviewed  Family Communication: Talked with daughter on phone.  Disposition: Status is: Inpatient Remains inpatient appropriate because: Severity of illness  Planned Discharge Destination: Home with Home Health  DVT prophylaxis.  Heparin Time spent: 45 minutes  This record has been created using Systems analyst. Errors have been sought and corrected,but may not always be located. Such creation errors do not reflect on the standard of care.   Author: Lorella Nimrod, MD 12/16/2022 2:36 PM  For on call review [email or web address].

## 2022-12-16 NOTE — TOC Progression Note (Signed)
Transition of Care [facility]) - Progression Note    Patient Details  Name: Teretha Ilg Mccuistion MRN: [id number] Date of Birth: 03/06/1946  [facility]) CM/SW Contact  Laurena Slimmer, RN Phone Number: 12/16/2022, 1:23 PM  Clinical Narrative:     Patient previously active with [facility]. Per [place] representative, [place]. Patient has been discharged from services.   Referral sent to Feliberto Harts from [facility].        Expected Discharge Plan and Services                                               Social Determinants of Health (SDOH) Interventions [place]: No Food Insecurity (12/15/2022)  Housing: Low Risk  (12/15/2022)  Transportation Needs: Unmet Transportation Needs (12/15/2022)  Utilities: Not At Risk (12/15/2022)  Recent Concern: Utilities - At Risk (10/06/2022)  Alcohol Screen: Low Risk  (08/24/2022)  Depression (PHQ2-9): Low Risk  (08/24/2022)  Financial Resource Strain: Medium Risk (08/24/2022)  Physical Activity: Inactive (08/24/2022)  Social Connections: Socially Isolated (08/24/2022)  Stress: Stress Concern Present (08/24/2022)  Tobacco Use: Medium Risk (12/14/2022)    Readmission Risk Interventions    11/08/2022   10:53 AM 10/21/2022    1:13 PM 08/17/2022   11:05 AM  Readmission Risk Prevention Plan  Transportation Screening Complete Complete Complete  PCP or Specialist Appt within 3-5 Days   Complete  HRI or [place]   Complete  Social Work Consult for [place] Planning/Counseling   Complete  Palliative Care Screening   Not Applicable  Medication Review Press photographer) Complete Complete Complete  PCP or Specialist appointment within 3-5 days of discharge Complete Complete   HRI or [place] Complete Complete   SW Recovery Care/Counseling Consult Complete Complete   Palliative Care Screening Complete Not [place] Not Applicable Not Applicable

## 2022-12-16 NOTE — Consult Note (Signed)
NAME: Natasha [name]  DOB: 11-Sep-1946  MRN: [id number]  Date/Time: 12/16/2022 1:58 PM  REQUESTING PROVIDER: Olin Pia Subjective:  REASON FOR CONSULT: cellulitis left leg ? Natasha [name] is a 77 y.o. 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 ( 1 dose received on 11/18/22), DM, COPD, b/l leg edema  presented with painful swelling left leg and blister /wound X 1 day duration on 12/14/22- denied any trauma In the ED vitals were N  12/14/22  BP 117/97 (H)  Temp 97.6 F (36.4 C)  Pulse Rate 77  Resp 18  SpO2 95 %  O2 Flow Rate (L/min) 4 L/min    Latest Reference Range & Units 12/14/22  WBC 4.0 - 10.5 K/uL 1.1 (LL) [1]  Hemoglobin 12.0 - 15.0 g/dL 7.0 (L)  HCT 36.0 - 46.0 % 24.0 (L)  Platelets 150 - 400 K/uL 110 (L)  Creatinine 0.44 - 1.00 mg/dL 0.76   Started on vanco and cefepime I am asked to see her for the same      Past Medical History:  Diagnosis Date   Anemia    Cancer ([place])    lymphoma-stomach    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   CHF (congestive heart failure) ([facility])    COPD (chronic obstructive pulmonary disease) ([place])    Diabetes mellitus without complication ([place])    Hypotension    Pleural effusion    ARMc 833m,  2 weeks ago   Vaginal delivery    x 5    Past Surgical History:  Procedure Laterality Date   IR IMAGING GUIDED PORT INSERTION  08/16/2022   TONSILLECTOMY Bilateral    as a child    Social History   Socioeconomic History   Marital status: Widowed    Spouse name: Not on file   Number of children: 2   Years of education: Not on file   Highest education level: 9th grade  Occupational History   Occupation: unemployed  Tobacco Use   Smoking status: Former    Packs/day: 2.00    Years: 55.00    Total pack years: 110.00    Types: Cigarettes    Start date: 07/07/1961    Quit date: 08/02/2022    Years since quitting: 0.3   Smokeless tobacco: Never   Tobacco comments:    1/2 pack she states but family says 2 PPD  Vaping Use    Vaping Use: Never used  Substance and Sexual Activity   Alcohol use: No   Drug use: Never   Sexual activity: Not Currently    Partners: Male    Birth control/protection: Post-menopausal  Other Topics Concern   Not on file  Social History Narrative   08/14/20   [place]  Living: to be near daughter   Work: retired      FPhysiological scientistchildren - JIT consultant(nearby) and son JEvelena Peat[place]in [place]  8 grandchildren, & 2 great-grandchildren.      Enjoys: stays at home      Exercise: walking to her daughter's store   Diet: eats fruit, air fried chicken      Safety   Seat belts: Yes    Guns: No   Safe in relationships: Yes    Social Determinants of Health   Financial Resource Strain: Medium Risk (08/24/2022)   Overall Financial Resource Strain (CARDIA)    Difficulty of Paying Living Expenses: Somewhat hard  Food Insecurity: No Food Insecurity (12/15/2022)   Hunger Vital Sign  Worried About Charity fundraiser in the Last Year: Never true    [place] in the Last Year: Never true  Transportation Needs: Unmet Transportation Needs (12/15/2022)   PRAPARE - Hydrologist (Medical): Yes    Lack of Transportation (Non-Medical): Yes  Physical Activity: Inactive (08/24/2022)   Exercise Vital Sign    Days of Exercise per Week: 0 days    Minutes of Exercise per Session: 0 min  Stress: Stress Concern Present (08/24/2022)   [place]    Feeling of Stress : To some extent  Social Connections: Socially Isolated (08/24/2022)   Social Connection and Isolation Panel [NHANES]    Frequency of Communication with Friends and Family: More than three times a week    Frequency of Social Gatherings with Friends and Family: More than three times a week    Attends Religious Services: Never    Marine scientist or Organizations: No    Attends Archivist Meetings: Never    Marital Status: Widowed   Intimate Partner Violence: Not At Risk (12/15/2022)   Humiliation, Afraid, Rape, and Kick questionnaire    Fear of Current or Ex-Partner: No    Emotionally Abused: No    Physically Abused: No    Sexually Abused: No    Family History  Problem Relation Age of Onset   Cancer Mother    Breast cancer Mother    Cancer Father    Alzheimer's disease Father    Lung cancer Father        lung   Varicose Veins Brother    Heart attack Brother    No Known Allergies I? Current Facility-Administered Medications  Medication Dose Route Frequency Provider Last Rate Last Admin   0.9 %  sodium chloride infusion (Manually program via Guardrails IV Fluids)   Intravenous Once Lorella Nimrod, MD       acetaminophen (TYLENOL) tablet 650 mg  650 mg Oral Q6H PRN Cox, Amy N, DO   650 mg at 12/14/22 2121   Or   acetaminophen (TYLENOL) suppository 650 mg  650 mg Rectal Q6H PRN Cox, Amy N, DO       acyclovir (ZOVIRAX) 200 MG capsule 400 mg  400 mg Oral Daily Cox, Amy N, DO   400 mg at 12/16/22 1033   ceFEPIme (MAXIPIME) 2 g in sodium chloride 0.9 % 100 mL IVPB  2 g Intravenous Q8H Cox, Amy N, DO 200 mL/hr at 12/16/22 1037 2 g at 12/16/22 1037   Chlorhexidine Gluconate Cloth 2 % PADS 6 each  6 each Topical Daily Lorella Nimrod, MD   6 each at 12/16/22 [phone]   furosemide (LASIX) tablet 20 mg  20 mg Oral Daily Cox, Amy N, DO   20 mg at 12/16/22 1033   guaiFENesin-dextromethorphan (ROBITUSSIN DM) 100-10 MG/5ML syrup 5 mL  5 mL Oral Q4H PRN Lorella Nimrod, MD   5 mL at 12/15/22 1022   heparin injection 5,000 Units  5,000 Units Subcutaneous Q8H Cox, Amy N, DO   5,000 Units at 12/16/22 1303   hydrALAZINE (APRESOLINE) injection 5 mg  5 mg Intravenous Q8H PRN Cox, Amy N, DO       insulin aspart (novoLOG) injection 0-15 Units  0-15 Units Subcutaneous TID WC Cox, Amy N, DO   3 Units at 12/16/22 1302   insulin aspart (novoLOG) injection 0-5 Units  0-5 Units Subcutaneous QHS Cox, Amy N, DO  ipratropium-albuterol (DUONEB)  0.5-2.5 (3) MG/3ML nebulizer solution 3 mL  3 mL Nebulization Q6H PRN Cox, Amy N, DO   3 mL at 12/15/22 2237   levothyroxine (SYNTHROID) tablet 50 mcg  50 mcg Oral Q0600 Cox, Amy N, DO   50 mcg at 12/16/22 0541   melatonin tablet 5 mg  5 mg Oral QHS PRN Cox, Amy N, DO       midodrine (PROAMATINE) tablet 10 mg  10 mg Oral TID WC Cox, Amy N, DO   10 mg at 12/16/22 1033   nicotine (NICODERM CQ - dosed in mg/24 hours) patch 21 mg  21 mg Transdermal Daily PRN Cox, Amy N, DO       ondansetron (ZOFRAN) tablet 4 mg  4 mg Oral Q6H PRN Cox, Amy N, DO       Or   ondansetron (ZOFRAN) injection 4 mg  4 mg Intravenous Q6H PRN Cox, Amy N, DO       pantoprazole (PROTONIX) EC tablet 40 mg  40 mg Oral Daily Cox, Amy N, DO   40 mg at 12/16/22 1033   senna-docusate (Senokot-S) tablet 1 tablet  1 tablet Oral QHS PRN Cox, Amy N, DO       Tenofovir Alafenamide Fumarate TABS 25 mg  25 mg Oral Daily Cox, Amy N, DO   25 mg at 12/16/22 1034   traMADol (ULTRAM) tablet 50 mg  50 mg Oral Q6H PRN Lorella Nimrod, MD   50 mg at 12/16/22 0550   vancomycin (VANCOREADY) IVPB 1250 mg/250 mL  1,250 mg Intravenous Q24H Cox, Amy N, DO 166.7 mL/hr at 12/15/22 2049 1,250 mg at 12/15/22 2049     Abtx:  Anti-infectives (From admission, onward)    Start     Dose/Rate Route Frequency Ordered Stop   12/15/22 2000  vancomycin (VANCOREADY) IVPB 1250 mg/250 mL        1,250 mg 166.7 mL/hr over 90 Minutes Intravenous Every 24 hours 12/14/22 1937     12/15/22 1000  acyclovir (ZOVIRAX) 200 MG capsule 400 mg        400 mg Oral Daily 12/14/22 2233     12/15/22 1000  Tenofovir Alafenamide Fumarate TABS 25 mg        25 mg Oral Daily 12/14/22 2233     12/15/22 0200  ceFEPIme (MAXIPIME) 2 g in sodium chloride 0.9 % 100 mL IVPB        2 g 200 mL/hr over 30 Minutes Intravenous Every 8 hours 12/14/22 1937     12/14/22 2000  vancomycin (VANCOREADY) IVPB 1750 mg/350 mL        1,750 mg 175 mL/hr over 120 Minutes Intravenous  Once 12/14/22 1937  12/15/22 0855   12/14/22 1715  ceFEPIme (MAXIPIME) 2 g in sodium chloride 0.9 % 100 mL IVPB        2 g 200 mL/hr over 30 Minutes Intravenous  Once 12/14/22 1708 12/14/22 1842   12/14/22 1715  metroNIDAZOLE (FLAGYL) IVPB 500 mg        500 mg 100 mL/hr over 60 Minutes Intravenous  Once 12/14/22 1708 12/14/22 1957   12/14/22 1715  vancomycin (VANCOCIN) IVPB 1000 mg/200 mL premix  Status:  Discontinued        1,000 mg 200 mL/hr over 60 Minutes Intravenous  Once 12/14/22 1708 12/14/22 1936       REVIEW OF SYSTEMS:  Const: negative fever, negative chills, negative weight loss Eyes: negative diplopia or visual changes, negative eye pain ENT:  negative coryza, negative sore throat Resp:, dyspnea has had thoracentesis Cards: negative for chest pain, palpitations, lower extremity edema GU: negative for frequency, dysuria and hematuria GI: Negative for abdominal pain, diarrhea, bleeding, constipation Skin: negative for rash and pruritus Heme: negative for easy bruising and gum/nose bleeding MS:  myalgias, arthralgias, back pain and muscle weakness Neurolo:negative for headaches, dizziness, vertigo, memory problems  Psych: negative for feelings of anxiety, depression  Endocrine: , diabetes Allergy/Immunology- negative for any medication or food allergies ?  Objective:  VITALS:  BP 113/60 (BP Location: Right Arm)   Pulse 77   Temp 97.8 F (36.6 C)   Resp 18   Ht '5\' 5"'$  (1.651 m)   Wt 77 kg   SpO2 98%   BMI 28.25 kg/m  LDA port PHYSICAL EXAM:  General: Alert, cooperative, no distress, pale Head: Normocephalic, without obvious abnormality, atraumatic. Eyes: Conjunctivae clear, anicteric sclerae. Pupils are equal ENT Nares normal. No drainage or sinus tenderness. Lips, mucosa, and tongue normal. No Thrush Neck: Supple, symmetrical,  adenopathy, thyroid: non tender no carotid bruit and no JVD. Back: No CVA tenderness. Lungs: b/la ir entry- crepts bases. Heart: Regular rate and  rhythm, no murmur, rub or gallop. Abdomen: Soft, non-tender,not distended. Bowel sounds normal. No masses Extremities: edema legs Lft > rt     Skin: No rashes or lesions. Or bruising Lymph: Cervical, supraclavicular normal. Neurologic: Grossly non-focal Pertinent Labs Lab Results CBC    Component Value Date/Time   WBC 1.4 (LL) 12/16/2022 0627   RBC 3.19 (L) 12/16/2022 0627   HGB 9.2 (L) 12/16/2022 0627   HGB 8.0 (L) 06/22/2022 0939   HCT 29.9 (L) 12/16/2022 0627   HCT 26.6 (L) 06/22/2022 0939   PLT 117 (L) 12/16/2022 0627   PLT 77 (LL) 06/22/2022 0939   MCV 93.7 12/16/2022 0627   MCV 93 06/22/2022 0939   MCH 28.8 12/16/2022 0627   MCHC 30.8 12/16/2022 0627   RDW 18.1 (H) 12/16/2022 0627   RDW 17.3 (H) 06/22/2022 0939   LYMPHSABS 0.8 12/14/2022 1739   LYMPHSABS 3.1 06/22/2022 0939   MONOABS 0.2 12/14/2022 1739   EOSABS 0.0 12/14/2022 1739   EOSABS 0.1 06/22/2022 0939   BASOSABS 0.0 12/14/2022 1739   BASOSABS 0.0 06/22/2022 0939       Latest Ref Rng & Units 12/15/2022    4:01 AM 12/14/2022    2:36 PM 12/08/2022    8:20 AM  CMP  Glucose 70 - 99 mg/dL 224  227  206   BUN 8 - 23 mg/dL 46  47  33   Creatinine 0.44 - 1.00 mg/dL 0.79  0.76  0.60   Sodium 135 - 145 mmol/L 141  143  137   Potassium 3.5 - 5.1 mmol/L 3.8  3.9  4.5   Chloride 98 - 111 mmol/L 99  98  95   CO2 22 - 32 mmol/L 34  36  37   Calcium 8.9 - 10.3 mg/dL 9.5  10.1  10.7   Total Protein 6.5 - 8.1 g/dL   4.9   Total Bilirubin 0.3 - 1.2 mg/dL   0.9   Alkaline Phos 38 - 126 U/L   113   AST 15 - 41 U/L   16   ALT 0 - 44 U/L   13       Microbiology: Recent Results (from the past 240 hour(s))  Fungus Culture With Stain     Status: None (Preliminary result)   Collection Time: 12/07/22  3:39 PM   Specimen: PATH Cytology Pleural fluid  Result Value Ref Range Status   Fungus Stain Final report  Final    Comment: (NOTE) Performed At: [facility] [place], [place]  [id number] Rush Farmer MD [id number]    Fungus (Mycology) Culture PENDING  Incomplete   Fungal Source PLEURAL  Final    Comment: Performed at [facility], [address] [place], [place] [address]  Body fluid culture w Gram Stain     Status: None   Collection Time: 12/07/22  3:39 PM   Specimen: PATH Cytology Pleural fluid  Result Value Ref Range Status   Specimen Description   Final    PLEURAL Performed at [facility], [address] [place], [place] [address]    Special Requests   Final    NONE Performed at [facility], [address] [place], [place] [address]    Gram Stain NO WBC SEEN NO ORGANISMS SEEN   Final   Culture   Final    NO GROWTH 3 DAYS Performed at [facility], [place] [address] [place], [place] [address]    Report Status 12/11/2022 FINAL  Final  Fungus Culture Result     Status: None   Collection Time: 12/07/22  3:39 PM  Result Value Ref Range Status   Result 1 Comment  Final    Comment: (NOTE) KOH/Calcofluor preparation:  no fungus observed. Performed At: [facility] [place], [place] [id number] Rush Farmer MD [id number]   Blood culture (routine x 2)     Status: None (Preliminary result)   Collection Time: 12/14/22  5:39 PM   Specimen: BLOOD  Result Value Ref Range Status   Specimen Description BLOOD PORTA CATH  Final   Special Requests   Final    BOTTLES DRAWN AEROBIC AND ANAEROBIC Blood Culture results may not be optimal due to an inadequate volume of blood received in culture bottles   Culture   Final    NO GROWTH 2 DAYS Performed at [facility], [address], [place], [place] [address]    Report Status PENDING  Incomplete  Culture, blood (Routine X 2) w Reflex to ID Panel     Status: None (Preliminary result)   Collection Time: 12/14/22 10:24 PM   Specimen: BLOOD  Result Value Ref Range Status   Specimen Description BLOOD  LEFT ARM  Final   Special  Requests   Final    BOTTLES DRAWN AEROBIC ONLY Blood Culture results may not be optimal due to an inadequate volume of blood received in culture bottles   Culture   Final    NO GROWTH 2 DAYS Performed at [facility], [address], [place], [place] [address]    Report Status PENDING  Incomplete    IMAGING RESULTS:  I have personally reviewed the films ?b/l pleural effusion Rt> left  Impression/Recommendation 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 ___________________________________________________

## 2022-12-16 NOTE — Progress Notes (Signed)
1714 BS 66 ax4 sitting up in chair. Pt given 195m of OJ, graham crackers and Peanut butter. Nurse will recheck within 180ms

## 2022-12-16 NOTE — Plan of Care (Signed)
  Problem: Education: Goal: Ability to describe self-care measures that may prevent or decrease complications (Diabetes Survival Skills Education) will improve Outcome: Progressing   Problem: Fluid Volume: Goal: Ability to maintain a balanced intake and output will improve Outcome: Progressing   Problem: Nutritional: Goal: Maintenance of adequate nutrition will improve Outcome: Progressing   Problem: Skin Integrity: Goal: Risk for impaired skin integrity will decrease Outcome: Progressing

## 2022-12-16 NOTE — Assessment & Plan Note (Signed)
-   In setting of lymphoma with chronic leukopenia - Blood cultures remain negative - Continue with cefepime and vancomycin per pharmacy -ID consult to determine the type and duration of appropriate antibiotic based on her immunocompromise state. - Continue with supportive care

## 2022-12-16 NOTE — Plan of Care (Signed)
  Problem: Coping: Goal: Ability to adjust to condition or change in health will improve Outcome: Progressing   

## 2022-12-17 ENCOUNTER — Other Ambulatory Visit: Payer: Self-pay | Admitting: Oncology

## 2022-12-17 ENCOUNTER — Ambulatory Visit: Payer: Medicaid Other | Admitting: Oncology

## 2022-12-17 ENCOUNTER — Ambulatory Visit: Payer: Medicaid Other

## 2022-12-17 ENCOUNTER — Telehealth: Payer: Self-pay | Admitting: Nurse Practitioner

## 2022-12-17 DIAGNOSIS — D709 Neutropenia, unspecified: Secondary | ICD-10-CM | POA: Diagnosis not present

## 2022-12-17 DIAGNOSIS — E119 Type 2 diabetes mellitus without complications: Secondary | ICD-10-CM | POA: Diagnosis not present

## 2022-12-17 DIAGNOSIS — J449 Chronic obstructive pulmonary disease, unspecified: Secondary | ICD-10-CM

## 2022-12-17 DIAGNOSIS — C8598 Non-Hodgkin lymphoma, unspecified, lymph nodes of multiple sites: Secondary | ICD-10-CM | POA: Diagnosis not present

## 2022-12-17 DIAGNOSIS — D61818 Other pancytopenia: Secondary | ICD-10-CM | POA: Diagnosis not present

## 2022-12-17 DIAGNOSIS — L03116 Cellulitis of left lower limb: Secondary | ICD-10-CM | POA: Diagnosis not present

## 2022-12-17 DIAGNOSIS — D649 Anemia, unspecified: Secondary | ICD-10-CM | POA: Diagnosis not present

## 2022-12-17 LAB — CBC
HCT: 29.9 % — ABNORMAL LOW (ref 36.0–46.0)
Hemoglobin: 9.2 g/dL — ABNORMAL LOW (ref 12.0–15.0)
MCH: 28.9 pg (ref 26.0–34.0)
MCHC: 30.8 g/dL (ref 30.0–36.0)
MCV: 94 fL (ref 80.0–100.0)
Platelets: 114 10*3/uL — ABNORMAL LOW (ref 150–400)
RBC: 3.18 MIL/uL — ABNORMAL LOW (ref 3.87–5.11)
RDW: 17.6 % — ABNORMAL HIGH (ref 11.5–15.5)
WBC: 1.4 10*3/uL — CL (ref 4.0–10.5)
nRBC: 0 % (ref 0.0–0.2)

## 2022-12-17 LAB — GLUCOSE, CAPILLARY
Glucose-Capillary: 106 mg/dL — ABNORMAL HIGH (ref 70–99)
Glucose-Capillary: 151 mg/dL — ABNORMAL HIGH (ref 70–99)
Glucose-Capillary: 142 mg/dL — ABNORMAL HIGH (ref 70–99)

## 2022-12-17 MED ORDER — CEFAZOLIN SODIUM-DEXTROSE 1-4 GM/50ML-% IV SOLN
1.0000 g | Freq: Three times a day (TID) | INTRAVENOUS | Status: DC
  Administered 2022-12-17: 1 g via INTRAVENOUS
  Filled 2022-12-17 (×2): qty 50

## 2022-12-17 MED ORDER — TRAMADOL HCL 50 MG PO TABS: 50.0000 mg | ORAL_TABLET | Freq: Four times a day (QID) | ORAL | 0 refills | Status: DC | PRN

## 2022-12-17 MED ORDER — NICOTINE 21 MG/24HR TD PT24: 21.0000 mg | MEDICATED_PATCH | Freq: Every day | TRANSDERMAL | 0 refills | Status: DC | PRN

## 2022-12-17 MED ORDER — CEPHALEXIN 500 MG PO CAPS: 500.0000 mg | ORAL_CAPSULE | Freq: Four times a day (QID) | ORAL | 0 refills | Status: AC

## 2022-12-17 MED ORDER — CEPHALEXIN 500 MG PO CAPS
500.0000 mg | ORAL_CAPSULE | Freq: Four times a day (QID) | ORAL | Status: DC
  Administered 2022-12-17 (×2): 500 mg via ORAL
  Filled 2022-12-17 (×2): qty 1

## 2022-12-17 MED ORDER — GUAIFENESIN-DM 100-10 MG/5ML PO SYRP: 5.0000 mL | ORAL_SOLUTION | ORAL | 0 refills | Status: DC | PRN

## 2022-12-17 NOTE — Telephone Encounter (Signed)
[facility] nurse called in to scheduled a hospital f/u for pt. Pt its been discharged today and will be going home. I scheduled her with her provider 3/1 '@11'$ :00am.

## 2022-12-17 NOTE — Discharge Summary (Signed)
Physician Discharge Summary   Patient: Natasha [name] MRN: [id number] DOB: 18-Oct-1946  Admit date:     12/14/2022  Discharge date: 12/17/22  Discharge Physician: Lorella Nimrod   PCP: [name], NP   Recommendations at discharge:  Please obtain CBC and BMP during next follow-up appointment Please ensure the completion of antibiotic course Follow-up with oncology Follow-up with primary care provider  Discharge Diagnoses: Principal Problem:   Left leg cellulitis Active Problems:   Type 2 diabetes mellitus without complication, with long-term current use of insulin (HCC)   Hypothyroidism, unspecified   Anemia of chronic disease   Neutropenia (HCC)   Lymphoma (HCC)   Post herpetic neuralgia   Chronic obstructive pulmonary disease (COPD) ([place])  Resolved Problems:   Acute on chronic anemia   Cigarette nicotine dependence, uncomplicated  Hospital Course: [name] is a 77 year old female with history of nodal marginal zone B cell lymphoma, chronic leukopenia, COPD on 4 L of oxygen, insulin-dependent diabetes mellitus, postherpetic neuralgia, who presents emergency department for chief concerns of left lower leg wound, along with pain and swelling.  No fever.  Initial vitals in the ED showed temperature of 97.8, respiration rate of 18, heart rate of 74, blood pressure 112/52, SpO2 of 94% on 2 L nasal cannula.  Serum sodium is 143, potassium 2.9, chloride 98, bicarb 36, BUN of 47, serum creatinine of 0.76, none fasting blood glucose 227, EGFR greater than 60, WBC 1.1, hemoglobin 7.0, platelets of 110.  Initial lactic acid was 1.7 and on repeat is 1.2.  ED treatment: Cefepime 2 g IV one-time dose, Flagyl, vancomycin.  2/21: Vitals normal.  Hemoglobin decreased to 6.6 with a decrease of WBC to 0.9, all cell lines decreased.  Ordered 2 unit of irradiated PRBC.  Oncology was also consulted today. 2/22: Vital stable.  Hemoglobin improved to 9.2 after getting 2 unit of PRBC.   WBC at 1.4 this morning.  Overnight shortness of breath after getting blood and additional fluid-one-time 20 mg of Lasix given.  Saturating well on her baseline oxygen requirement of 4 L.  BNP at 256. Patient is worried about missing her treatment for lymphoma.  ID was consulted to help with the choice and duration of antibiotic for cellulitis based on her immunocompromise status. Ordered PT/OT evaluation for discharge support.  2/22: Vitals and labs stable around her baseline, WBC 1.4, hemoglobin 9.2 and platelet 114. ID evaluated her and recommending switching to Keflex for discharge for total of 7-day antibiotics. Patient received cefepime and vancomycin while in the hospital due to her immunocompromise status.  She should use compression stockings and keep lower extremity elevated while sitting.  She will continue with rest of her home medications and planning to continue chemotherapy.  Patient will follow-up with her providers for further recommendations.  Assessment and Plan: * Left leg cellulitis - In setting of lymphoma with chronic leukopenia - Blood cultures remain negative - Continue with cefepime and vancomycin per pharmacy -ID consult to determine the type and duration of appropriate antibiotic based on her immunocompromise state. - Continue with supportive care  Type 2 diabetes mellitus without complication, with long-term current use of insulin (HCC) - Insulin SSI with at bedtime coverage ordered - Goal inpatient blood glucose levels 140-180  Hypothyroidism, unspecified - Levothyroxine 50 mcg daily resumed  Anemia of chronic disease Patient with anemia of chronic disease secondary to lymphoma.  History of pancytopenia.  Hemoglobin decreased to 6.6 today. -Ordered 2 unit of irradiated PRBC -Monitor hemoglobin -Transfuse  below 7  Neutropenia ([place]) Secondary to lymphoma.  Patient with severe neutropenia and leukopenia which are part of her pancytopenia.  Per oncology  this is due to her lymphoma and she is not a candidate for Neupogen. -Continue to monitor -Continue with supportive care  Lymphoma [facility]) Patient with history of nodal marginal zone B-cell lymphoma. Responsible for pancytopenia-prior oncology not a candidate for Neupogen -Oncology will follow here  Post herpetic neuralgia - Resumed home acyclovir 400 mg daily  Chronic obstructive pulmonary disease (COPD) ([place]) - Not in acute exacerbation - Currently requiring 2 L nasal cannula, patient states at baseline she uses 4 L - DuoNebs every 6 hours as needed for shortness of breath and wheezing  Diabetes mellitus, type II (HCC) - Insulin SSI with at bedtime coverage ordered  Cigarette nicotine dependence, uncomplicated-resolved as of 12/14/2022 - As needed nicotine patch for nicotine craving ordered   Pain control - [facility] Controlled Substance Reporting System database was reviewed. and patient was instructed, not to drive, operate heavy machinery, perform activities at heights, swimming or participation in water activities or provide baby-sitting services while on Pain, Sleep and Anxiety Medications; until their outpatient Physician has advised to do so again. Also recommended to not to take more than prescribed Pain, Sleep and Anxiety Medications.  Consultants: Oncology, infectious disease Procedures performed: None Disposition: Home health Diet recommendation:  Discharge Diet Orders (From admission, onward)     Start     Ordered   12/17/22 0000  Diet - low sodium heart healthy        12/17/22 1027           Cardiac and Carb modified diet DISCHARGE MEDICATION: Allergies as of 12/17/2022   No Known Allergies      Medication List     STOP taking these medications    amoxicillin-clavulanate 875-125 MG tablet Commonly known as: AUGMENTIN       TAKE these medications    acyclovir 400 MG tablet Commonly known as: ZOVIRAX Take 400 mg by mouth daily.   albuterol  108 (90 Base) MCG/ACT inhaler Commonly known as: VENTOLIN HFA TAKE 2 PUFFS BY MOUTH EVERY 6 HOURS AS NEEDED FOR WHEEZE OR SHORTNESS OF BREATH What changed:  how much to take how to take this when to take this reasons to take this   cephALEXin 500 MG capsule Commonly known as: KEFLEX Take 1 capsule (500 mg total) by mouth every 6 (six) hours for 5 days.   clobetasol cream 0.05 % Commonly known as: TEMOVATE Apply 1 Application topically daily.   furosemide 20 MG tablet Commonly known as: LASIX Take 1 tablet (20 mg total) by mouth daily.   guaiFENesin-dextromethorphan 100-10 MG/5ML syrup Commonly known as: ROBITUSSIN DM Take 5 mLs by mouth every 4 (four) hours as needed for cough.   insulin NPH-regular Human (70-30) 100 UNIT/ML injection Inject 4 Units into the skin 2 (two) times daily with a meal. Use only for hyperglycemia, CBG+ 300. Start with 5 units, titrate up to max 10 unit per dose if need.   ipratropium-albuterol 0.5-2.5 (3) MG/3ML Soln Commonly known as: DUONEB Take 3 mLs by nebulization every 6 (six) hours as needed (shortness of breath or wheezing).   lenalidomide 10 MG capsule Commonly known as: REVLIMID Take 1 capsule (10 mg total) by mouth daily. Take for 14 days, then hold for 7 days.   levothyroxine 50 MCG tablet Commonly known as: SYNTHROID Take 1 tablet (50 mcg total) by mouth daily at 6 (  six) AM.   loperamide 2 MG capsule Commonly known as: IMODIUM Take 2 mg by mouth as needed for diarrhea or loose stools.   midodrine 10 MG tablet Commonly known as: PROAMATINE Take 1 tablet (10 mg total) by mouth 3 (three) times daily with meals.   nicotine 21 mg/24hr patch Commonly known as: NICODERM CQ - dosed in mg/24 hours Place 1 patch (21 mg total) onto the skin daily as needed (nicotine craving).   nystatin-triamcinolone ointment Commonly known as: MYCOLOG Apply 1 Application topically 2 (two) times daily.   ondansetron 8 MG tablet Commonly known as:  ZOFRAN Take 8 mg by mouth every 8 (eight) hours as needed for nausea, vomiting or refractory nausea / vomiting. Take 1 tablet q6H for 4 days after chemotherapy.   pantoprazole 40 MG tablet Commonly known as: Protonix Take 1 tablet (40 mg total) by mouth daily.   prochlorperazine 10 MG tablet Commonly known as: COMPAZINE Take 1 tablet by mouth every 6 (six) hours as needed for nausea or vomiting. Take 1 tab q6H for 4 days after chemotherapy.   simethicone 80 MG chewable tablet Commonly known as: MYLICON Chew 99991111 mg by mouth every 6 (six) hours as needed for flatulence. Take 1 capsule PO PRN gas relief.   traMADol 50 MG tablet Commonly known as: ULTRAM Take 1 tablet (50 mg total) by mouth every 6 (six) hours as needed for moderate pain or severe pain.   Vemlidy 25 MG Tabs Generic drug: Tenofovir Alafenamide Fumarate Take 1 tablet (25 mg total) by mouth daily.               Discharge Care Instructions  (From admission, onward)           Start     Ordered   12/17/22 0000  No dressing needed        12/17/22 1027            Follow-up Information     [name], NP. Schedule an appointment as soon as possible for a visit in 1 week(s).   Specialties: Nurse Practitioner, Family Medicine Contact information: [place] [address] [phone]         Sindy Guadeloupe, MD Follow up.   Specialty: Oncology Contact information: [place] [address] [phone]                Discharge Exam: Danley Danker Weights   12/15/22 0640  Weight: 77 kg   General.     In no acute distress. Pulmonary.  Lungs clear bilaterally, normal respiratory effort. CV.  Regular rate and rhythm, no JVD, rub or murmur. Abdomen.  Soft, nontender, nondistended, BS positive. CNS.  Alert and oriented .  No focal neurologic deficit. Extremities.  Trace LE edema with some erythema, Psychiatry.  Judgment and insight appears normal.   Condition  at discharge: stable  The results of significant diagnostics from this hospitalization (including imaging, microbiology, ancillary and laboratory) are listed below for reference.   Imaging Studies: DG Chest 2 View  Result Date: 12/15/2022 CLINICAL DATA:  Shortness of breath, increased EXAM: CHEST - 2 VIEW COMPARISON:  12/07/2022 FINDINGS: RIGHT jugular Port-A-Cath with tip projecting over cavoatrial junction. Enlargement of cardiac silhouette with pulmonary vascular congestion. Diffuse infiltrates consistent with pulmonary edema. Interval increase in BILATERAL pleural effusions and bibasilar atelectasis. No pneumothorax or acute osseous findings. IMPRESSION: Increased pulmonary edema and bibasilar pleural effusions/atelectasis. Electronically Signed   By: Crist Infante.D.  On: 12/15/2022 16:27   DG Tibia/Fibula Left  Result Date: 12/14/2022 CLINICAL DATA:  Leg pain swelling EXAM: LEFT TIBIA AND FIBULA - 2 VIEW COMPARISON:  None Available. FINDINGS: No fracture or malalignment. No soft tissue emphysema. No periostitis or osseous destructive change. 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 Diffuse soft tissue swelling IMPRESSION: No acute osseous abnormality. 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 Electronically Signed   By: Donavan Foil M.D.   On: 12/14/2022 18:34   US Venous Img Lower Unilateral Left  Result Date: 12/14/2022 CLINICAL DATA:  left leg pain EXAM: LEFT LOWER EXTREMITY VENOUS DOPPLER ULTRASOUND TECHNIQUE: Gray-scale sonography with compression, as well as color and duplex ultrasound, were performed to evaluate the deep venous system(s) from the level of the common femoral vein through the popliteal and proximal calf veins. COMPARISON:  None Available. FINDINGS: VENOUS Normal compressibility of the common femoral, superficial femoral, and popliteal veins, as well as the visualized calf veins. Visualized portions of profunda femoral vein and great saphenous vein unremarkable. No filling defects to suggest  DVT on grayscale or color Doppler imaging. Doppler waveforms show normal direction of venous flow, normal respiratory plasticity and response to augmentation. Limited views of the contralateral common femoral vein are unremarkable. OTHER Multiple lymph nodes in the groins noted. The largest measures approximately 4.2 x 1.3 cm in the left groin. IMPRESSION: No evidence of DVT in the left lower extremity. Electronically Signed   By: Margaretha Sheffield M.D.   On: 12/14/2022 18:22   US THORACENTESIS ASP PLEURAL SPACE W/IMG GUIDE  Result Date: 12/07/2022 INDICATION: History of gastric lymphoma with recurrent pleural effusions. Request received for diagnostic and therapeutic thoracentesis. Upon ultrasound exam right larger than left. EXAM: ULTRASOUND GUIDED DIAGNOSTIC AND THERAPEUTIC RIGHT THORACENTESIS MEDICATIONS: 10 mL 1 % lidocaine COMPLICATIONS: None immediate. PROCEDURE: An ultrasound guided thoracentesis was thoroughly discussed with the patient and questions answered. The benefits, risks, alternatives and complications were also discussed. The patient understands and wishes to proceed with the procedure. Written consent was obtained. Ultrasound was performed to localize and mark an adequate pocket of fluid in the right chest. The area was then prepped and draped in the normal sterile fashion. 1% Lidocaine was used for local anesthesia. Under ultrasound guidance a 6 Fr Safe-T-Centesis catheter was introduced. Thoracentesis was performed. The catheter was removed and a dressing applied. FINDINGS: A total of approximately 1.2 L of clear, amber fluid was removed. Samples were sent to the laboratory as requested by the clinical team. IMPRESSION: Successful ultrasound guided right thoracentesis yielding 1.2 L of pleural fluid. Read by: Narda Rutherford, AGNP-BC Electronically Signed   By: Albin Felling M.D.   On: 12/07/2022 16:19   DG Chest Port 1 View  Result Date: 12/07/2022 CLINICAL DATA:  Status post right  thoracentesis. EXAM: PORTABLE CHEST 1 VIEW COMPARISON:  12/01/2022 and chest CT 11/07/2022 FINDINGS: Improved aeration in the right lower lung compatible with recent thoracentesis. Residual densities at the right lung base compatible with areas of volume loss and pleural fluid. There is a nodular density in the mid right lung which corresponds with the lesion seen on the previous CT. This nodule lesion measures roughly 2.2 cm. Right jugular Port-A-Cath with the tip near the superior cavoatrial junction. Persistent left basilar densities compatible with pleural fluid and compressive atelectasis. Patchy interstitial densities in both lungs are similar to the previous chest CT. Negative for a pneumothorax. IMPRESSION: 1. Improved aeration in the right lower lung compatible with recent thoracentesis. Negative for a pneumothorax. 2. Residual densities  at the lung bases compatible with pleural effusions and compressive atelectasis/consolidation. 3. Nodular density in the mid right lung corresponds with the lesion seen on the previous CT. 4. Persistent patchy interstitial densities in both lungs. Electronically Signed   By: Markus Daft M.D.   On: 12/07/2022 16:04   DG Chest 2 View  Result Date: 12/03/2022 CLINICAL DATA:  Pleural effusions.  Assess for amount of fluid. EXAM: CHEST - 2 VIEW COMPARISON:  11/10/2022.  CT, 11/07/2022. FINDINGS: Moderate left and moderate to large right pleural effusions, right increased from the prior exam, left likely also increased, but to a lesser degree. Central vascular congestion. Hazy perihilar opacity and more confluent lung base opacity. Lung apices are essentially clear. No pneumothorax. Cardiac silhouette is obscured. No gross mediastinal mass. No convincing hilar mass. Right anterior chest wall Port-A-Cath is stable. Skeletal structures are grossly intact. IMPRESSION: 1. Mild interval increase in the size of the right pleural effusion, now moderate to large. Moderate left pleural  effusion suspected to be slightly increased from the recent prior study. 2. Vascular congestion. Central hazy perihilar airspace opacities and more confluent lung base opacities. Lung opacities may reflect atelectasis, edema or a combination, similar in appearance to the prior exam. Electronically Signed   By: Lajean Manes M.D.   On: 12/03/2022 15:58    Microbiology: Results for orders placed or performed during the hospital encounter of 12/14/22  Blood culture (routine x 2)     Status: None (Preliminary result)   Collection Time: 12/14/22  5:39 PM   Specimen: BLOOD  Result Value Ref Range Status   Specimen Description BLOOD PORTA CATH  Final   Special Requests   Final    BOTTLES DRAWN AEROBIC AND ANAEROBIC Blood Culture results may not be optimal due to an inadequate volume of blood received in culture bottles   Culture   Final    NO GROWTH 3 DAYS Performed at [facility], [address] [place], [place] [address]    Report Status PENDING  Incomplete  Culture, blood (Routine X 2) w Reflex to ID Panel     Status: None (Preliminary result)   Collection Time: 12/14/22 10:24 PM   Specimen: BLOOD  Result Value Ref Range Status   Specimen Description BLOOD  LEFT ARM  Final   Special Requests   Final    BOTTLES DRAWN AEROBIC ONLY Blood Culture results may not be optimal due to an inadequate volume of blood received in culture bottles   Culture   Final    NO GROWTH 3 DAYS Performed at [facility], [address] [place], [place] [address]    Report Status PENDING  Incomplete    Labs: CBC: Recent Labs  Lab 12/14/22 1739 12/15/22 0401 12/15/22 2255 12/16/22 0627 12/17/22 0643  WBC 1.1* 0.9* 1.9* 1.4* 1.4*  NEUTROABS 0.1*  --   --   --   --   HGB 7.0* 6.6* 9.3* 9.2* 9.2*  HCT 24.0* 22.4* 30.3* 29.9* 29.9*  MCV 95.6 95.3 94.4 93.7 94.0  PLT 110* 105* 125* 117* [id number]*   Basic Metabolic Panel: Recent Labs  Lab 12/14/22 1436 12/15/22 0401  NA 143 141  K  3.9 3.8  CL 98 99  CO2 36* 34*  GLUCOSE 227* 224*  BUN 47* 46*  CREATININE 0.76 0.79  CALCIUM 10.1 9.5   Liver Function Tests: No results for input(s): "AST", "ALT", "ALKPHOS", "BILITOT", "PROT", "ALBUMIN" in the last 168 hours. CBG: Recent Labs  Lab  12/16/22 1154 12/16/22 1706 12/16/22 1733 12/16/22 2057 12/17/22 0811  GLUCAP 185* 66* 89 193* 106*    Discharge time spent: greater than 30 minutes.  This record has been created using Systems analyst. Errors have been sought and corrected,but may not always be located. Such creation errors do not reflect on the standard of care.   Signed: Lorella Nimrod, MD [facility] 12/17/2022

## 2022-12-17 NOTE — Care Management Important Message (Signed)
Important Message  Patient Details  Name: [name] MRN: [id number] Date of Birth: February 22, 1946   Medicare Important Message Given:  N/A - LOS <3 / Initial given by admissions     Juliann Pulse A Greysin Medlen 12/17/2022, 10:44 AM

## 2022-12-17 NOTE — TOC Transition Note (Signed)
Transition of Care [facility]) - CM/SW Discharge Note   Patient Details  Name: Natasha [name] MRN: [id number] Date of Birth: Jan 06, 1946  [facility] CM/SW Contact:  Laurena Slimmer, RN Phone Number: 12/17/2022, 1:52 PM   Clinical Narrative:    Spoke with patient regarding discharge today.  She was advised she was discharger from [employer] She did not have a choice of another agency She was agreeable to services from [facility] and is aware they will call her to schedule her [place].   TOC signing off.          Patient Goals and CMS Choice      Discharge Placement                         Discharge Plan and Services Additional resources added to the After Visit Summary for                                       Social Determinants of Health (SDOH) Interventions [place]: No Food Insecurity (12/15/2022)  Housing: Low Risk  (12/15/2022)  Transportation Needs: Unmet Transportation Needs (12/15/2022)  Utilities: Not At Risk (12/15/2022)  Recent Concern: Utilities - At Risk (10/06/2022)  Alcohol Screen: Low Risk  (08/24/2022)  Depression (PHQ2-9): Low Risk  (08/24/2022)  Financial Resource Strain: Medium Risk (08/24/2022)  Physical Activity: Inactive (08/24/2022)  Social Connections: Socially Isolated (08/24/2022)  Stress: Stress Concern Present (08/24/2022)  Tobacco Use: Medium Risk (12/14/2022)     Readmission Risk Interventions    11/08/2022   10:53 AM 10/21/2022    1:13 PM 08/17/2022   11:05 AM  Readmission Risk Prevention Plan  Transportation Screening Complete Complete Complete  PCP or Specialist Appt within 3-5 Days   Complete  HRI or [place]   Complete  Social Work Consult for [place] Planning/Counseling   Complete  Palliative Care Screening   Not Applicable  Medication Review [employer]) Complete Complete Complete  PCP or Specialist appointment within 3-5 days of discharge Complete Complete    HRI or [place] Complete Complete   SW Recovery Care/Counseling Consult Complete Complete   Palliative Care Screening Complete Not [place] Not Applicable Not Applicable

## 2022-12-17 NOTE — Evaluation (Signed)
Physical Therapy Evaluation Patient Details Name: Natasha [name] MRN: [id number] DOB: [birth year] Today's Date: 12/17/2022  History of Present Illness  Patient is a 77 year old female presenting to the emergency department for left lower leg wound, pain, swelling. History of anemia, neutropenia, lymphoma, COPD   Clinical Impression  Patient was agreeable to PT evaluation and is hopeful to discharge home today. She required no physical assistance with bed mobility, transfers, or short distance ambulation using rolling walker. Supervision provided for safety with occasional safety cues. Dyspnea with exertion that subsides with frequent rest breaks. Activity tolerance limited chronically with limited standing endurance at baseline. Recommend to continue PT to maximize independence and decrease caregiver burden. Anticipate patient can return home with family support. HHPT recommended.      Recommendations for follow up therapy are one component of a multi-disciplinary discharge planning process, led by the attending physician.  Recommendations may be updated based on patient status, additional functional criteria and insurance authorization.  Follow Up Recommendations Home health PT      Assistance Recommended at Discharge Set up Supervision/Assistance  Patient can return home with the following  A little help with walking and/or transfers;A little help with bathing/dressing/bathroom    Equipment Recommendations None recommended by PT  Recommendations for Other Services       Functional Status Assessment Patient has had a recent decline in their functional status and demonstrates the ability to make significant improvements in function in a reasonable and predictable amount of time.     Precautions / Restrictions Precautions Precautions: Fall Restrictions Weight Bearing Restrictions: No      Mobility  Bed Mobility Overal bed mobility: Needs Assistance Bed Mobility: Supine to Sit      Supine to sit: Supervision, HOB elevated     General bed mobility comments: increased time with no physical assistance needed    Transfers Overall transfer level: Needs assistance Equipment used: Rolling walker (2 wheels) Transfers: Sit to/from Stand Sit to Stand: Supervision           General transfer comment: increased time with no physical assistance required    Ambulation/Gait Ambulation/Gait assistance: Supervision Gait Distance (Feet): 5 Feet Assistive device: Rolling walker (2 wheels) Gait Pattern/deviations: Decreased stride length Gait velocity: decreased     General Gait Details: activity tolerance is limited by fatigue. patient is requesting to hold on further standing activity until after pain medication. occasional cues for safety with using rolling walker. encourage patient to continue using rolling walker for safety and fall prevention in home setting.  Stairs            Wheelchair Mobility    Modified Rankin (Stroke Patients Only)       Balance Overall balance assessment: Needs assistance Sitting-balance support: Feet supported Sitting balance-Leahy Scale: Fair     Standing balance support: Bilateral upper extremity supported, During functional activity Standing balance-Leahy Scale: Fair Standing balance comment: no external support required to maintain standing balance                             Pertinent Vitals/Pain Pain Assessment Pain Assessment: 0-10 Pain Score: 7  Pain Location: distal L leg Pain Descriptors / Indicators: Discomfort Pain Intervention(s): Limited activity within patient's tolerance, Monitored during session, Repositioned    Home Living Family/patient expects to be discharged to:: Private residence Living Arrangements: Other (Comment) (grandchildren) Available Help at Discharge: Family Type of Home: House Home Access: Stairs to enter  Entrance Stairs-Number of Steps: 2   Home Layout: One  level Home Equipment: Conservation officer, nature (2 wheels);Cane - single point;Hospital bed;BSC/3in1 Additional Comments: 4L O2    Prior Function Prior Level of Function : Needs assist;History of Falls (last six months)             Mobility Comments: assistance required on the stairs with difficulty reported due to fatigue. limited ambulation at baseline with rolling walker       Hand Dominance        Extremity/Trunk Assessment   Upper Extremity Assessment Upper Extremity Assessment: Generalized weakness    Lower Extremity Assessment Lower Extremity Assessment: Generalized weakness (bandaging from knee distally on BLE)       Communication   Communication: HOH  Cognition Arousal/Alertness: Awake/alert Behavior During Therapy: WFL for tasks assessed/performed Overall Cognitive Status: Within Functional Limits for tasks assessed                                          General Comments General comments (skin integrity, edema, etc.): Dyspneic with exertion that resolved after rest breaks. patient demonstrated pursed lip breathing techniques and rest breaks required frequently    Exercises     Assessment/Plan    PT Assessment Patient needs continued PT services  PT Problem List Cardiopulmonary status limiting activity;Decreased mobility;Decreased activity tolerance       PT Treatment Interventions DME instruction;Gait training;Stair training;Functional mobility training;Therapeutic exercise;Therapeutic activities;Balance training;Neuromuscular re-education;Cognitive remediation;Patient/family education    PT Goals (Current goals can be found in the Care Plan section)  Acute Rehab PT Goals Patient Stated Goal: to go home today PT Goal Formulation: With patient Time For Goal Achievement: 12/31/22 Potential to Achieve Goals: Fair    Frequency Min 2X/week     Co-evaluation               AM-PAC PT "6 Clicks" Mobility  Outcome Measure Help needed  turning from your back to your side while in a flat bed without using bedrails?: A Little Help needed moving from lying on your back to sitting on the side of a flat bed without using bedrails?: A Little Help needed moving to and from a bed to a chair (including a wheelchair)?: A Little Help needed standing up from a chair using your arms (e.g., wheelchair or bedside chair)?: A Little Help needed to walk in hospital room?: A Little Help needed climbing 3-5 steps with a railing? : A Little 6 Click Score: 18    End of Session Equipment Utilized During Treatment: Oxygen Activity Tolerance: Patient tolerated treatment well Patient left: in chair;with call bell/phone within reach;with chair alarm set;with nursing/sitter in room Nurse Communication: Mobility status PT Visit Diagnosis: Muscle weakness (generalized) (M62.81);Unsteadiness on feet (R26.81)    Time: 0910-0930 PT Time Calculation (min) (ACUTE ONLY): 20 min   Charges:   PT Evaluation $PT Eval Low Complexity: 1 Low PT Treatments $Therapeutic Activity: 8-22 mins        Natasha [name], PT, MPT   Natasha [name] 12/17/2022, 10:28 AM

## 2022-12-17 NOTE — Progress Notes (Signed)
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

## 2022-12-17 NOTE — Evaluation (Signed)
Occupational Therapy Evaluation Patient Details Name: Natasha [name] MRN: [id number] DOB: [birth year] Today's Date: 12/17/2022   History of Present Illness Patient is a 77 year old female presenting to the emergency department for left lower leg wound, pain, swelling. History of anemia, neutropenia, lymphoma, COPD   Clinical Impression   Natasha [name] was seen for OT evaluation this date. Prior to hospital admission, pt was MOD I using RW. Pt lives with family. Pt presents to acute OT demonstrating near baseline for ADLs. Pt currently requires SBA + RW toilet t/f and don pants sit<>stand at chair. Reports feeling mildly SOB and initiates seated rest break, resolves. Educated on ECS. All education complete, will sign off. Upon hospital discharge, recommend no OT follow up.   Recommendations for follow up therapy are one component of a multi-disciplinary discharge planning process, led by the attending physician.  Recommendations may be updated based on patient status, additional functional criteria and insurance authorization.   Follow Up Recommendations  No OT follow up     Assistance Recommended at Discharge Set up Supervision/Assistance  Patient can return home with the following A little help with walking and/or transfers;A little help with bathing/dressing/bathroom;Help with stairs or ramp for entrance    Functional Status Assessment  Patient has not had a recent decline in their functional status  Equipment Recommendations  None recommended by OT    Recommendations for Other Services       Precautions / Restrictions Precautions Precautions: Fall Restrictions Weight Bearing Restrictions: No      Mobility Bed Mobility               General bed mobility comments: not tested    Transfers Overall transfer level: Needs assistance Equipment used: Rolling walker (2 wheels) Transfers: Sit to/from Stand Sit to Stand: Supervision                  Balance Overall  balance assessment: Needs assistance Sitting-balance support: Feet supported Sitting balance-Leahy Scale: Fair     Standing balance support: During functional activity, No upper extremity supported Standing balance-Leahy Scale: Fair Standing balance comment: no external support required to maintain standing balance                           ADL either performed or assessed with clinical judgement   ADL Overall ADL's : Needs assistance/impaired                                       General ADL Comments: SBA + RW toilet t/f and don pants in chair      Pertinent Vitals/Pain Pain Assessment Pain Assessment: Faces Faces Pain Scale: Hurts a little bit Pain Location: distal L leg Pain Descriptors / Indicators: Discomfort Pain Intervention(s): Limited activity within patient's tolerance     Hand Dominance     Extremity/Trunk Assessment Upper Extremity Assessment Upper Extremity Assessment: Overall WFL for tasks assessed   Lower Extremity Assessment Lower Extremity Assessment: Generalized weakness       Communication Communication Communication: HOH   Cognition Arousal/Alertness: Awake/alert Behavior During Therapy: WFL for tasks assessed/performed Overall Cognitive Status: Within Functional Limits for tasks assessed  General Comments  reports mildly SOB is baseline            Home Living Family/patient expects to be discharged to:: Private residence Living Arrangements: Other (Comment) (grandchildren) Available Help at Discharge: Family Type of Home: House Home Access: Stairs to enter Technical brewer of Steps: 2   Home Layout: One level     Bathroom Shower/Tub: Teacher, early years/pre: Standard     Home Equipment: Conservation officer, nature (2 wheels);Cane - single point;Hospital bed;BSC/3in1   Additional Comments: 4L O2      Prior Functioning/Environment Prior  Level of Function : Needs assist;History of Falls (last six months)             Mobility Comments: assistance required on the stairs with difficulty reported due to fatigue. limited ambulation at baseline with rolling walker          OT Problem List: Decreased strength;Decreased activity tolerance         OT Goals(Current goals can be found in the care plan section) Acute Rehab OT Goals Patient Stated Goal: to go home OT Goal Formulation: With patient Time For Goal Achievement: 12/31/22 Potential to Achieve Goals: Good   AM-PAC OT "6 Clicks" Daily Activity     Outcome Measure Help from another person eating meals?: None Help from another person taking care of personal grooming?: A Little Help from another person toileting, which includes using toliet, bedpan, or urinal?: A Little Help from another person bathing (including washing, rinsing, drying)?: A Little Help from another person to put on and taking off regular upper body clothing?: None Help from another person to put on and taking off regular lower body clothing?: None 6 Click Score: 21   End of Session    Activity Tolerance: Patient tolerated treatment well Patient left: in chair;with call bell/phone within reach;with chair alarm set  OT Visit Diagnosis: Unsteadiness on feet (R26.81);Muscle weakness (generalized) (M62.81)                Time: [id number] OT Time Calculation (min): 10 min Charges:  OT General Charges $OT Visit: 1 Visit OT Evaluation $OT Eval Low Complexity: 1 Low  Natasha [name], M.S. OTR/L  12/17/22, 12:03 PM  ascom [phone]

## 2022-12-17 NOTE — Plan of Care (Signed)
Patient discharged per MD orders at this time.All dc instructions, education and medications reviewed with the patient.Pt expressed understanding and will comply with dc instructions.f/u appointments was also communicated to the patient.no verbal c/o or any ssx of distress.patient was discharged home with self-care per order.wound dressing instructions was provided to the family before transport per Dr Delaine Lame recommendations.Extra wound dressing supplies was also given to the patient.Pt was transported home by grand-dtrs in a privately owned vehicle.

## 2022-12-17 NOTE — Consult Note (Incomplete)
NAME: Natasha [name]  DOB: 1946-02-13  MRN: [id number]  Date/Time: 12/16/2022 1:58 PM  REQUESTING PROVIDER: dr.Amin Subjective:  REASON FOR CONSULT:    Paralee Semmler Sorter is a 76 y.o. with a history of   ID   Steroid/immune suppressants/splenectomy/Hardware Recent Procedure Surgery Injections Trauma Sick contacts Travel Antibiotic use Food- raw/exotic Animal bites Tick exposure Water sports Fishing/hunting/animal bird exposure Past Medical History:  Diagnosis Date  . Anemia   . 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 CHF (congestive heart failure) ([place])   . COPD (chronic obstructive pulmonary disease) ([place])   . Diabetes mellitus without complication ([place])   . Hypotension   . Pleural effusion    ARMc 874m,  2 weeks ago  . Vaginal delivery    x 5    Past Surgical History:  Procedure Laterality Date  . IR IMAGING GUIDED PORT INSERTION  08/16/2022  . TONSILLECTOMY Bilateral    as a child    Social History   Socioeconomic History  . Marital status: Widowed    Spouse name: Not on file  . Number of children: 2  . Years of education: Not on file  . Highest education level: 9th grade  Occupational History  . Occupation: unemployed  Tobacco Use  . Smoking status: Former    Packs/day: 2.00    Years: 55.00    Total pack years: 110.00    Types: Cigarettes    Start date: 07/07/1961    Quit date: 08/02/2022    Years since quitting: 0.3  . Smokeless tobacco: Never  . Tobacco comments:    1/2 pack she states but family says 2 PPD  Vaping Use  . Vaping Use: Never used  Substance and Sexual Activity  . Alcohol use: No  . Drug use: Never  . Sexual activity: Not Currently    Partners: Male    Birth control/protection: Post-menopausal  Other Topics Concern  . Not on file  Social History Narrative   08/14/20   [place]  Living: to be near daughter   Work: retired      FPhysiological scientistchildren - JIT consultant(nearby) and son JEvelena Peat[place]  8  grandchildren, & 2 great-grandchildren.      Enjoys: stays at home      Exercise: walking to her daughter's store   Diet: eats fruit, air fried chicken      Safety   Seat belts: Yes    Guns: No   Safe in relationships: Yes    Social Determinants of Health   Financial Resource Strain: Medium Risk (08/24/2022)   Overall Financial Resource Strain (CARDIA)   . Difficulty of Paying Living Expenses: Somewhat hard  Food Insecurity: No Food Insecurity (12/15/2022)   Hunger Vital Sign   . Worried About RCharity fundraiserin the Last Year: Never true   . Ran Out of Food in the Last Year: Never true  Transportation Needs: Unmet Transportation Needs (12/15/2022)   PRAPARE - Transportation   . Lack of Transportation (Medical): Yes   . Lack of Transportation (Non-Medical): Yes  Physical Activity: Inactive (08/24/2022)   Exercise Vital Sign   . Days of Exercise per Week: 0 days   . Minutes of Exercise per Session: 0 min  Stress: Stress Concern Present (08/24/2022)   [place]  . Feeling of Stress : To some extent  Social Connections: Socially Isolated (08/24/2022)  Social Connection and Isolation Panel [NHANES]   . Frequency of Communication with Friends and Family: More than three times a week   . Frequency of Social Gatherings with Friends and Family: More than three times a week   . Attends Religious Services: Never   . Active Member of Clubs or Organizations: No   . Attends Archivist Meetings: Never   . Marital Status: Widowed  Intimate Partner Violence: Not At Risk (12/15/2022)   Humiliation, Afraid, Rape, and Kick questionnaire   . Fear of Current or Ex-Partner: No   . Emotionally Abused: No   . Physically Abused: No   . Sexually Abused: No    Family History  Problem Relation Age of Onset  . Cancer Mother   . Breast cancer Mother   . Cancer Father   . Alzheimer's disease Father   . Lung cancer  Father        lung  . Varicose Veins Brother   . Heart attack Brother    No Known Allergies I  Current Facility-Administered Medications  Medication Dose Route Frequency Provider Last Rate Last Admin  . 0.9 %  sodium chloride infusion (Manually program via Guardrails IV Fluids)   Intravenous Once Lorella Nimrod, MD      . acetaminophen (TYLENOL) tablet 650 mg  650 mg Oral Q6H PRN Cox, Amy N, DO   650 mg at 12/14/22 2121   Or  . acetaminophen (TYLENOL) suppository 650 mg  650 mg Rectal Q6H PRN Cox, Amy N, DO      . acyclovir (ZOVIRAX) 200 MG capsule 400 mg  400 mg Oral Daily Cox, Amy N, DO   400 mg at 12/16/22 1033  . ceFEPIme (MAXIPIME) 2 g in sodium chloride 0.9 % 100 mL IVPB  2 g Intravenous Q8H Cox, Amy N, DO 200 mL/hr at 12/16/22 1037 2 g at 12/16/22 1037  . Chlorhexidine Gluconate Cloth 2 % PADS 6 each  6 each Topical Daily Lorella Nimrod, MD   6 each at 12/16/22 [phone]  . furosemide (LASIX) tablet 20 mg  20 mg Oral Daily Cox, Amy N, DO   20 mg at 12/16/22 1033  . guaiFENesin-dextromethorphan (ROBITUSSIN DM) 100-10 MG/5ML syrup 5 mL  5 mL Oral Q4H PRN Lorella Nimrod, MD   5 mL at 12/15/22 1022  . heparin injection 5,000 Units  5,000 Units Subcutaneous Q8H Cox, Amy N, DO   5,000 Units at 12/16/22 1303  . hydrALAZINE (APRESOLINE) injection 5 mg  5 mg Intravenous Q8H PRN Cox, Amy N, DO      . insulin aspart (novoLOG) injection 0-15 Units  0-15 Units Subcutaneous TID WC Cox, Amy N, DO   3 Units at 12/16/22 1302  . insulin aspart (novoLOG) injection 0-5 Units  0-5 Units Subcutaneous QHS Cox, Amy N, DO      . ipratropium-albuterol (DUONEB) 0.5-2.5 (3) MG/3ML nebulizer solution 3 mL  3 mL Nebulization Q6H PRN Cox, Amy N, DO   3 mL at 12/15/22 2237  . levothyroxine (SYNTHROID) tablet 50 mcg  50 mcg Oral Q0600 Cox, Amy N, DO   50 mcg at 12/16/22 0541  . melatonin tablet 5 mg  5 mg Oral QHS PRN Cox, Amy N, DO      . midodrine (PROAMATINE) tablet 10 mg  10 mg Oral TID WC Cox, Amy N, DO   10 mg at  12/16/22 1033  . nicotine (NICODERM CQ - dosed in mg/24 hours) patch 21 mg  21 mg Transdermal  Daily PRN Cox, Amy N, DO      . ondansetron (ZOFRAN) tablet 4 mg  4 mg Oral Q6H PRN Cox, Amy N, DO       Or  . ondansetron (ZOFRAN) injection 4 mg  4 mg Intravenous Q6H PRN Cox, Amy N, DO      . pantoprazole (PROTONIX) EC tablet 40 mg  40 mg Oral Daily Cox, Amy N, DO   40 mg at 12/16/22 1033  . senna-docusate (Senokot-S) tablet 1 tablet  1 tablet Oral QHS PRN Cox, Amy N, DO      . Tenofovir Alafenamide Fumarate TABS 25 mg  25 mg Oral Daily Cox, Amy N, DO   25 mg at 12/16/22 1034  . traMADol (ULTRAM) tablet 50 mg  50 mg Oral Q6H PRN Lorella Nimrod, MD   50 mg at 12/16/22 0550  . vancomycin (VANCOREADY) IVPB 1250 mg/250 mL  1,250 mg Intravenous Q24H Cox, Amy N, DO 166.7 mL/hr at 12/15/22 2049 1,250 mg at 12/15/22 2049     Abtx:  Anti-infectives (From admission, onward)    Start     Dose/Rate Route Frequency Ordered Stop   12/15/22 2000  vancomycin (VANCOREADY) IVPB 1250 mg/250 mL        1,250 mg 166.7 mL/hr over 90 Minutes Intravenous Every 24 hours 12/14/22 1937     12/15/22 1000  acyclovir (ZOVIRAX) 200 MG capsule 400 mg        400 mg Oral Daily 12/14/22 2233     12/15/22 1000  Tenofovir Alafenamide Fumarate TABS 25 mg        25 mg Oral Daily 12/14/22 2233     12/15/22 0200  ceFEPIme (MAXIPIME) 2 g in sodium chloride 0.9 % 100 mL IVPB        2 g 200 mL/hr over 30 Minutes Intravenous Every 8 hours 12/14/22 1937     12/14/22 2000  vancomycin (VANCOREADY) IVPB 1750 mg/350 mL        1,750 mg 175 mL/hr over 120 Minutes Intravenous  Once 12/14/22 1937 12/15/22 0855   12/14/22 1715  ceFEPIme (MAXIPIME) 2 g in sodium chloride 0.9 % 100 mL IVPB        2 g 200 mL/hr over 30 Minutes Intravenous  Once 12/14/22 1708 12/14/22 1842   12/14/22 1715  metroNIDAZOLE (FLAGYL) IVPB 500 mg        500 mg 100 mL/hr over 60 Minutes Intravenous  Once 12/14/22 1708 12/14/22 1957   12/14/22 1715  vancomycin (VANCOCIN)  IVPB 1000 mg/200 mL premix  Status:  Discontinued        1,000 mg 200 mL/hr over 60 Minutes Intravenous  Once 12/14/22 1708 12/14/22 1936       REVIEW OF SYSTEMS:  Const: negative fever, negative chills, negative weight loss Eyes: negative diplopia or visual changes, negative eye pain ENT: negative coryza, negative sore throat Resp: negative cough, hemoptysis, dyspnea Cards: negative for chest pain, palpitations, lower extremity edema GU: negative for frequency, dysuria and hematuria GI: Negative for abdominal pain, diarrhea, bleeding, constipation Skin: negative for rash and pruritus Heme: negative for easy bruising and gum/nose bleeding MS: negative for myalgias, arthralgias, back pain and muscle weakness Neurolo:negative for headaches, dizziness, vertigo, memory problems  Psych: negative for feelings of anxiety, depression  Endocrine: negative for thyroid, diabetes Allergy/Immunology- negative for any medication or food allergies   Pertinent Positives include : Objective:  VITALS:  BP 113/60 (BP Location: Right Arm)   Pulse 77   Temp 97.8  F (36.6 C)   Resp 18   Ht 5' 5"$  (1.651 m)   Wt 77 kg   SpO2 98%   BMI 28.25 kg/m  LDA Foley Central line Other drainage tubes PHYSICAL EXAM:  General: Alert, cooperative, no distress, appears stated age.  Head: Normocephalic, without obvious abnormality, atraumatic. Eyes: Conjunctivae clear, anicteric sclerae. Pupils are equal ENT Nares normal. No drainage or sinus tenderness. Lips, mucosa, and tongue normal. No Thrush Neck: Supple, symmetrical, no adenopathy, thyroid: non tender no carotid bruit and no JVD. Back: No CVA tenderness. Lungs: Clear to auscultation bilaterally. No Wheezing or Rhonchi. No rales. Heart: Regular rate and rhythm, no murmur, rub or gallop. Abdomen: Soft, non-tender,not distended. Bowel sounds normal. No masses Extremities: atraumatic, no cyanosis. No edema. No clubbing Skin: No rashes or lesions. Or  bruising Lymph: Cervical, supraclavicular normal. Neurologic: Grossly non-focal Pertinent Labs Lab Results CBC    Component Value Date/Time   WBC 1.4 (LL) 12/16/2022 0627   RBC 3.19 (L) 12/16/2022 0627   HGB 9.2 (L) 12/16/2022 0627   HGB 8.0 (L) 06/22/2022 0939   HCT 29.9 (L) 12/16/2022 0627   HCT 26.6 (L) 06/22/2022 0939   PLT 117 (L) 12/16/2022 0627   PLT 77 (LL) 06/22/2022 0939   MCV 93.7 12/16/2022 0627   MCV 93 06/22/2022 0939   MCH 28.8 12/16/2022 0627   MCHC 30.8 12/16/2022 0627   RDW 18.1 (H) 12/16/2022 0627   RDW 17.3 (H) 06/22/2022 0939   LYMPHSABS 0.8 12/14/2022 1739   LYMPHSABS 3.1 06/22/2022 0939   MONOABS 0.2 12/14/2022 1739   EOSABS 0.0 12/14/2022 1739   EOSABS 0.1 06/22/2022 0939   BASOSABS 0.0 12/14/2022 1739   BASOSABS 0.0 06/22/2022 0939       Latest Ref Rng & Units 12/15/2022    4:01 AM 12/14/2022    2:36 PM 12/08/2022    8:20 AM  CMP  Glucose 70 - 99 mg/dL 224  227  206   BUN 8 - 23 mg/dL 46  47  33   Creatinine 0.44 - 1.00 mg/dL 0.79  0.76  0.60   Sodium 135 - 145 mmol/L 141  143  137   Potassium 3.5 - 5.1 mmol/L 3.8  3.9  4.5   Chloride 98 - 111 mmol/L 99  98  95   CO2 22 - 32 mmol/L 34  36  37   Calcium 8.9 - 10.3 mg/dL 9.5  10.1  10.7   Total Protein 6.5 - 8.1 g/dL   4.9   Total Bilirubin 0.3 - 1.2 mg/dL   0.9   Alkaline Phos 38 - 126 U/L   113   AST 15 - 41 U/L   16   ALT 0 - 44 U/L   13       Microbiology: Recent Results (from the past 240 hour(s))  Fungus Culture With Stain     Status: None (Preliminary result)   Collection Time: 12/07/22  3:39 PM   Specimen: PATH Cytology Pleural fluid  Result Value Ref Range Status   Fungus Stain Final report  Final    Comment: (NOTE) Performed At: [facility] [address] [place], [place] [id number] Rush Farmer MD [id number]    Fungus (Mycology) Culture PENDING  Incomplete   Fungal Source PLEURAL  Final    Comment: Performed at [facility], [address] [place], [place] [address]  Body fluid culture w Gram Stain     Status: None   Collection Time:  12/07/22  3:39 PM   Specimen: PATH Cytology Pleural fluid  Result Value Ref Range Status   Specimen Description   Final    PLEURAL Performed at [facility], [address], [place], [place] [address]    Special Requests   Final    NONE Performed at [facility] [place], [place] [address]    Gram Stain NO WBC SEEN NO ORGANISMS SEEN   Final   Culture   Final    NO GROWTH 3 DAYS Performed at [facility], [place] [address], [place], [place] [address]    Report Status 12/11/2022 FINAL  Final  Fungus Culture Result     Status: None   Collection Time: 12/07/22  3:39 PM  Result Value Ref Range Status   Result 1 Comment  Final    Comment: (NOTE) KOH/Calcofluor preparation:  no fungus observed. Performed At: [facility] [place], [place] [id number] Rush Farmer MD [id number]   Blood culture (routine x 2)     Status: None (Preliminary result)   Collection Time: 12/14/22  5:39 PM   Specimen: BLOOD  Result Value Ref Range Status   Specimen Description BLOOD PORTA CATH  Final   Special Requests   Final    BOTTLES DRAWN AEROBIC AND ANAEROBIC Blood Culture results may not be optimal due to an inadequate volume of blood received in culture bottles   Culture   Final    NO GROWTH 2 DAYS Performed at [facility], [address], [place], [place] [address]    Report Status PENDING  Incomplete  Culture, blood (Routine X 2) w Reflex to ID Panel     Status: None (Preliminary result)   Collection Time: 12/14/22 10:24 PM   Specimen: BLOOD  Result Value Ref Range Status   Specimen Description BLOOD  LEFT ARM  Final   Special Requests   Final    BOTTLES DRAWN AEROBIC ONLY Blood Culture results may not be optimal due to an inadequate volume of blood received in culture bottles   Culture   Final    NO GROWTH 2  DAYS Performed at [facility], [address] [place], [place] [address]    Report Status PENDING  Incomplete    IMAGING RESULTS: I have personally reviewed the films   Impression/Recommendation       ___________________________________________________ Discussed with patient, requesting provider Note:  This document was prepared using Dragon voice recognition software and may include unintentional dictation errors.

## 2022-12-18 ENCOUNTER — Other Ambulatory Visit: Payer: Self-pay

## 2022-12-19 LAB — CULTURE, BLOOD (ROUTINE X 2)
Culture: NO GROWTH
Culture: NO GROWTH

## 2022-12-20 ENCOUNTER — Telehealth: Payer: Self-pay | Admitting: *Deleted

## 2022-12-20 NOTE — Transitions of Care (Post Inpatient/ED Visit) (Signed)
   12/20/2022  Name: Natasha [name] MRN: [id number] DOB: [birth year]  Today's TOC FU Call Status: Today's TOC FU Call Status:: Unsuccessul Call (1st Attempt) Unsuccessful Call (1st Attempt) Date: 12/20/22  Attempted to reach the patient regarding the most recent Inpatient/ED visit.  Follow Up Plan: Additional outreach attempts will be made to reach the patient to complete the Transitions of Care (Post Inpatient/ED visit) call.   [facility] [phone]

## 2022-12-21 ENCOUNTER — Other Ambulatory Visit: Payer: Self-pay

## 2022-12-21 ENCOUNTER — Inpatient Hospital Stay: Payer: 59 | Admitting: Oncology

## 2022-12-21 ENCOUNTER — Telehealth: Payer: Self-pay | Admitting: Nurse Practitioner

## 2022-12-21 ENCOUNTER — Telehealth: Payer: Self-pay | Admitting: *Deleted

## 2022-12-21 ENCOUNTER — Other Ambulatory Visit (HOSPITAL_COMMUNITY): Payer: Self-pay

## 2022-12-21 DIAGNOSIS — N1831 Chronic kidney disease, stage 3a: Secondary | ICD-10-CM | POA: Diagnosis not present

## 2022-12-21 DIAGNOSIS — E039 Hypothyroidism, unspecified: Secondary | ICD-10-CM | POA: Diagnosis not present

## 2022-12-21 DIAGNOSIS — C884 Extranodal marginal zone B-cell lymphoma of mucosa-associated lymphoid tissue [MALT-lymphoma]: Secondary | ICD-10-CM | POA: Diagnosis not present

## 2022-12-21 DIAGNOSIS — Z794 Long term (current) use of insulin: Secondary | ICD-10-CM | POA: Diagnosis not present

## 2022-12-21 DIAGNOSIS — L03116 Cellulitis of left lower limb: Secondary | ICD-10-CM | POA: Diagnosis not present

## 2022-12-21 DIAGNOSIS — D709 Neutropenia, unspecified: Secondary | ICD-10-CM | POA: Diagnosis not present

## 2022-12-21 DIAGNOSIS — I5033 Acute on chronic diastolic (congestive) heart failure: Secondary | ICD-10-CM | POA: Diagnosis not present

## 2022-12-21 DIAGNOSIS — D630 Anemia in neoplastic disease: Secondary | ICD-10-CM | POA: Diagnosis not present

## 2022-12-21 DIAGNOSIS — R21 Rash and other nonspecific skin eruption: Secondary | ICD-10-CM

## 2022-12-21 DIAGNOSIS — B0229 Other postherpetic nervous system involvement: Secondary | ICD-10-CM | POA: Diagnosis not present

## 2022-12-21 DIAGNOSIS — Z79899 Other long term (current) drug therapy: Secondary | ICD-10-CM | POA: Diagnosis not present

## 2022-12-21 DIAGNOSIS — Z79891 Long term (current) use of opiate analgesic: Secondary | ICD-10-CM | POA: Diagnosis not present

## 2022-12-21 DIAGNOSIS — J449 Chronic obstructive pulmonary disease, unspecified: Secondary | ICD-10-CM | POA: Diagnosis not present

## 2022-12-21 DIAGNOSIS — Z9981 Dependence on supplemental oxygen: Secondary | ICD-10-CM | POA: Diagnosis not present

## 2022-12-21 DIAGNOSIS — E119 Type 2 diabetes mellitus without complications: Secondary | ICD-10-CM | POA: Diagnosis not present

## 2022-12-21 DIAGNOSIS — I509 Heart failure, unspecified: Secondary | ICD-10-CM | POA: Diagnosis not present

## 2022-12-21 MED ORDER — CLOBETASOL PROPIONATE 0.05 % EX CREA: 1.0000 | TOPICAL_CREAM | Freq: Every day | CUTANEOUS | 0 refills | Status: DC

## 2022-12-21 MED ORDER — LANCET DEVICE MISC: 1.0000 | Freq: Three times a day (TID) | 0 refills | Status: DC

## 2022-12-21 MED ORDER — BLOOD GLUCOSE MONITORING SUPPL DEVI: 1.0000 | Freq: Three times a day (TID) | 0 refills | Status: AC

## 2022-12-21 MED ORDER — BLOOD GLUCOSE TEST VI STRP: 1.0000 | ORAL_STRIP | Freq: Three times a day (TID) | 0 refills | Status: AC

## 2022-12-21 MED ORDER — NYSTATIN-TRIAMCINOLONE 100000-0.1 UNIT/GM-% EX OINT: 1.0000 | TOPICAL_OINTMENT | Freq: Two times a day (BID) | CUTANEOUS | 0 refills | Status: DC

## 2022-12-21 MED ORDER — LANCETS MISC. MISC: 1.0000 | Freq: Three times a day (TID) | 0 refills | Status: DC

## 2022-12-21 MED ORDER — ALBUTEROL SULFATE HFA 108 (90 BASE) MCG/ACT IN AERS: INHALATION_SPRAY | RESPIRATORY_TRACT | 1 refills | Status: DC

## 2022-12-21 MED ORDER — IPRATROPIUM-ALBUTEROL 0.5-2.5 (3) MG/3ML IN SOLN: 3.0000 mL | Freq: Four times a day (QID) | RESPIRATORY_TRACT | 0 refills | Status: DC | PRN

## 2022-12-21 MED FILL — Dexamethasone Sodium Phosphate Inj 100 MG/10ML: INTRAMUSCULAR | Qty: 1 | Status: AC

## 2022-12-21 NOTE — Telephone Encounter (Signed)
Sharyn Lull with [facility] called stating  that she opened patient to services and she needs to speak with you regarding patient medications. Please return her call

## 2022-12-21 NOTE — Telephone Encounter (Signed)
S/w Sharyn Lull -  Pt is not getting adequate care at home by family ( granddaughter, granddaughter boyfriend, man named [name] who was helping pt and now refuses)  Keflex has not been picked up from pharmacy, has not had sugar checked or been given insulin since came home from hospital. No glucometer in home. The temovate cream has not been being applied. Does not have in home. Nystatin not being applied to fungal rash under breasts. Does not have in home.  Home is dirty, and unkempt - Sharyn Lull has concern for open wounds/pt welfare. L leg has draining wounds, R leg has weeping blisters.   Per Sharyn Lull the following was recommended :  Wound care via [facility] , Calcium algenate on wounds with Unna boots to combat swelling and keep wounds covered and free of outside contaminant - verbal order given Education officer, museum to help patient receive assistance, transportation, etc. - verbal order given Refills on medications she is supposed to have - Albuterol inhaler, Duoneb, Nystatin, Temovate, Glucometer and supplies sent to pharm.   Insulin :: asking for KwikPen instead of vial, which pen ?  Tramadol :: needs to be filled by you, sent to you.  Family instructed to [facility] all [facility] from [facility] [address].  Has appt w/ you 3/1

## 2022-12-21 NOTE — Telephone Encounter (Signed)
Sharyn Lull from [facility] called in staying that she would like to go over pt meds and also on helping her to get a Education officer, museum, boots for the legs, etc. She's available '@[phone].

## 2022-12-21 NOTE — Telephone Encounter (Signed)
Called Nursing facility due to patient has not been seen by NP since 07/17/22 and several Appt canceled due to the seriousness of this call advised Homehealth agency they should call Adult protective serves if the home is as unstable as described. Advised Patient really needs to be seen at [facility] on 3/1/ 2024. Patient is being followed by Oncology as well and the Home health agency has reached out to the Oncologist. [facility] was assigned to patient but Social worker Has not been able to reach patient Noted failed attempts in chart. Order for [facility] in chart but unable to contact patient through [facility]. Advised homehealth agency to hold  verbal [facility]) until provider can be made aware for Unna boots and wound care. Vicenta Dunning with [facility] home health if concerned for patient safety needs to notify Adult protective services , Sharyn Lull stated she would speak with her supervisor and get recommendations.

## 2022-12-22 ENCOUNTER — Inpatient Hospital Stay (HOSPITAL_BASED_OUTPATIENT_CLINIC_OR_DEPARTMENT_OTHER): Payer: 59 | Admitting: Oncology

## 2022-12-22 ENCOUNTER — Inpatient Hospital Stay: Payer: 59

## 2022-12-22 ENCOUNTER — Telehealth: Payer: Self-pay | Admitting: *Deleted

## 2022-12-22 ENCOUNTER — Encounter: Payer: Self-pay | Admitting: Oncology

## 2022-12-22 ENCOUNTER — Telehealth: Payer: Self-pay | Admitting: Pharmacist

## 2022-12-22 VITALS — BP 133/78 | HR 73 | Resp 18

## 2022-12-22 VITALS — BP 137/72 | HR 81 | Temp 98.3°F | Resp 18 | Ht 65.0 in | Wt 176.0 lb

## 2022-12-22 DIAGNOSIS — C8300 Small cell B-cell lymphoma, unspecified site: Secondary | ICD-10-CM | POA: Diagnosis not present

## 2022-12-22 DIAGNOSIS — R0602 Shortness of breath: Secondary | ICD-10-CM | POA: Diagnosis not present

## 2022-12-22 DIAGNOSIS — N644 Mastodynia: Secondary | ICD-10-CM | POA: Diagnosis not present

## 2022-12-22 DIAGNOSIS — M5136 Other intervertebral disc degeneration, lumbar region: Secondary | ICD-10-CM | POA: Diagnosis not present

## 2022-12-22 DIAGNOSIS — Z794 Long term (current) use of insulin: Secondary | ICD-10-CM | POA: Diagnosis not present

## 2022-12-22 DIAGNOSIS — J90 Pleural effusion, not elsewhere classified: Secondary | ICD-10-CM | POA: Diagnosis not present

## 2022-12-22 DIAGNOSIS — J948 Other specified pleural conditions: Secondary | ICD-10-CM | POA: Diagnosis not present

## 2022-12-22 DIAGNOSIS — Z5181 Encounter for therapeutic drug level monitoring: Secondary | ICD-10-CM

## 2022-12-22 DIAGNOSIS — C8308 Small cell B-cell lymphoma, lymph nodes of multiple sites: Secondary | ICD-10-CM | POA: Diagnosis not present

## 2022-12-22 DIAGNOSIS — E119 Type 2 diabetes mellitus without complications: Secondary | ICD-10-CM | POA: Diagnosis not present

## 2022-12-22 DIAGNOSIS — K8020 Calculus of gallbladder without cholecystitis without obstruction: Secondary | ICD-10-CM | POA: Diagnosis not present

## 2022-12-22 DIAGNOSIS — R5383 Other fatigue: Secondary | ICD-10-CM | POA: Diagnosis not present

## 2022-12-22 DIAGNOSIS — Z79620 Long term (current) use of immunosuppressive biologic: Secondary | ICD-10-CM

## 2022-12-22 DIAGNOSIS — K922 Gastrointestinal hemorrhage, unspecified: Secondary | ICD-10-CM | POA: Diagnosis not present

## 2022-12-22 DIAGNOSIS — I2510 Atherosclerotic heart disease of native coronary artery without angina pectoris: Secondary | ICD-10-CM | POA: Diagnosis not present

## 2022-12-22 DIAGNOSIS — J449 Chronic obstructive pulmonary disease, unspecified: Secondary | ICD-10-CM | POA: Diagnosis not present

## 2022-12-22 DIAGNOSIS — M47814 Spondylosis without myelopathy or radiculopathy, thoracic region: Secondary | ICD-10-CM | POA: Diagnosis not present

## 2022-12-22 DIAGNOSIS — R590 Localized enlarged lymph nodes: Secondary | ICD-10-CM | POA: Diagnosis not present

## 2022-12-22 DIAGNOSIS — D649 Anemia, unspecified: Secondary | ICD-10-CM | POA: Diagnosis not present

## 2022-12-22 DIAGNOSIS — Z5112 Encounter for antineoplastic immunotherapy: Secondary | ICD-10-CM | POA: Diagnosis not present

## 2022-12-22 DIAGNOSIS — M4856XA Collapsed vertebra, not elsewhere classified, lumbar region, initial encounter for fracture: Secondary | ICD-10-CM | POA: Diagnosis not present

## 2022-12-22 DIAGNOSIS — M7989 Other specified soft tissue disorders: Secondary | ICD-10-CM | POA: Diagnosis not present

## 2022-12-22 DIAGNOSIS — I509 Heart failure, unspecified: Secondary | ICD-10-CM | POA: Diagnosis not present

## 2022-12-22 DIAGNOSIS — D6959 Other secondary thrombocytopenia: Secondary | ICD-10-CM | POA: Diagnosis not present

## 2022-12-22 LAB — COMPREHENSIVE METABOLIC PANEL
ALT: 13 U/L (ref 0–44)
AST: 17 U/L (ref 15–41)
Albumin: 2.9 g/dL — ABNORMAL LOW (ref 3.5–5.0)
Alkaline Phosphatase: 99 U/L (ref 38–126)
Anion gap: 7 (ref 5–15)
BUN: 38 mg/dL — ABNORMAL HIGH (ref 8–23)
CO2: 35 mmol/L — ABNORMAL HIGH (ref 22–32)
Calcium: 10.1 mg/dL (ref 8.9–10.3)
Chloride: 100 mmol/L (ref 98–111)
Creatinine, Ser: 0.62 mg/dL (ref 0.44–1.00)
GFR, Estimated: >60 mL/min (ref >60)
Glucose, Bld: 180 mg/dL — ABNORMAL HIGH (ref 70–99)
Potassium: 4.4 mmol/L (ref 3.5–5.1)
Sodium: 142 mmol/L (ref 135–145)
Total Bilirubin: 0.6 mg/dL (ref 0.3–1.2)
Total Protein: 5.3 g/dL — ABNORMAL LOW (ref 6.5–8.1)

## 2022-12-22 LAB — CBC WITH DIFFERENTIAL/PLATELET
Abs Immature Granulocytes: 0.02 10*3/uL (ref 0.00–0.07)
Basophils Absolute: 0 10*3/uL (ref 0.0–0.1)
Basophils Relative: 1 %
Eosinophils Absolute: 0 10*3/uL (ref 0.0–0.5)
Eosinophils Relative: 3 %
HCT: 27.3 % — ABNORMAL LOW (ref 36.0–46.0)
Hemoglobin: 8.3 g/dL — ABNORMAL LOW (ref 12.0–15.0)
Immature Granulocytes: 2 %
Lymphocytes Relative: 69 %
Lymphs Abs: 0.8 10*3/uL (ref 0.7–4.0)
MCH: 28.2 pg (ref 26.0–34.0)
MCHC: 30.4 g/dL (ref 30.0–36.0)
MCV: 92.9 fL (ref 80.0–100.0)
Monocytes Absolute: 0.2 10*3/uL (ref 0.1–1.0)
Monocytes Relative: 14 %
Neutro Abs: 0.1 10*3/uL — CL (ref 1.7–7.7)
Neutrophils Relative %: 11 %
Platelets: 154 10*3/uL (ref 150–400)
RBC: 2.94 MIL/uL — ABNORMAL LOW (ref 3.87–5.11)
RDW: 16.5 % — ABNORMAL HIGH (ref 11.5–15.5)
WBC: 1.2 10*3/uL — ABNORMAL LOW (ref 4.0–10.5)
nRBC: 0 % (ref 0.0–0.2)

## 2022-12-22 MED ORDER — DIPHENHYDRAMINE HCL 25 MG PO CAPS
50.0000 mg | ORAL_CAPSULE | Freq: Once | ORAL | Status: AC
  Administered 2022-12-22: 50 mg via ORAL
  Filled 2022-12-22: qty 2

## 2022-12-22 MED ORDER — LENALIDOMIDE 5 MG PO CAPS: 5.0000 mg | ORAL_CAPSULE | Freq: Every day | ORAL | 0 refills | Status: DC

## 2022-12-22 MED ORDER — SODIUM CHLORIDE 0.9 % IV SOLN
400.0000 mg | Freq: Once | INTRAVENOUS | Status: AC
  Administered 2022-12-22: 400 mg via INTRAVENOUS
  Filled 2022-12-22: qty 40

## 2022-12-22 MED ORDER — ACETAMINOPHEN 325 MG PO TABS
650.0000 mg | ORAL_TABLET | Freq: Once | ORAL | Status: AC
  Administered 2022-12-22: 650 mg via ORAL
  Filled 2022-12-22: qty 2

## 2022-12-22 MED ORDER — SODIUM CHLORIDE 0.9 % IV SOLN
Freq: Once | INTRAVENOUS | Status: AC
  Filled 2022-12-22: qty 250

## 2022-12-22 MED ORDER — SODIUM CHLORIDE 0.9 % IV SOLN
10.0000 mg | Freq: Once | INTRAVENOUS | Status: AC
  Administered 2022-12-22: 10 mg via INTRAVENOUS
  Filled 2022-12-22: qty 10

## 2022-12-22 MED ORDER — HEPARIN SOD (PORK) LOCK FLUSH 100 UNIT/ML IV SOLN
500.0000 [IU] | Freq: Once | INTRAVENOUS | Status: AC | PRN
  Administered 2022-12-22: 500 [IU]
  Filled 2022-12-22: qty 5

## 2022-12-22 MED FILL — Dexamethasone Sodium Phosphate Inj 100 MG/10ML: INTRAMUSCULAR | Qty: 1 | Status: AC

## 2022-12-22 NOTE — Transitions of Care (Post Inpatient/ED Visit) (Signed)
   12/22/2022  Name: Natasha [name] MRN: [id number] DOB: Apr 14, 1946  Today's TOC FU Call Status: Today's TOC FU Call Status:: Successful TOC FU Call Competed TOC FU Call Complete Date: 12/22/22  Transition Care Management Follow-up Telephone Call Date of Discharge: 12/17/22 Discharge Facility: [facility]) Primary Inpatient Discharge Diagnosis:: Neutropenia How have you been since you were released from the hospital?: Better Any questions or concerns?: Yes Patient Questions/Concerns:: [place] patient daughter the [facility] aide hollered at her mother while bathing her. Patient Questions/Concerns Addressed: Other: (RN told her to talk with the Agency and discuss her concerns)  Items Reviewed: Did you receive and understand the discharge instructions provided?: Yes Medications obtained and verified?: Yes (Medications Reviewed) Any new allergies since your discharge?: No Dietary orders reviewed?: No Do you have support at home?: Yes People in Home: child(ren), adult Name of Support/Comfort Primary Source: [facility] and Equipment/Supplies: [place] Ordered?: Yes Name of [place]:: [place] set up a time to come to your home?: Yes [place] Visit Date: 12/22/22 Any new equipment or medical supplies ordered?: No  Functional Questionnaire: Do you need assistance with bathing/showering or dressing?: Yes Do you need assistance with meal preparation?: Yes Do you need assistance with eating?: No Do you have difficulty maintaining continence: No Do you need assistance with getting out of bed/getting out of a chair/moving?: No Do you have difficulty managing or taking your medications?: No  Folllow up appointments reviewed: PCP Follow-up appointment confirmed?: Yes Date of PCP follow-up appointment?: 12/24/22 Follow-up Provider: Tawni Carnes NP [facility] Follow-up appointment confirmed?: Yes Date of  Specialist follow-up appointment?: 12/22/22 Follow-Up Specialty Provider:: DR Hedwig Morton Do you need transportation to your follow-up appointment?: No Do you understand care options if your condition(s) worsen?: Yes-patient verbalized understanding  [facility] Coordinator is following up with patient.  Patient daughter and grand daughter are keeping up with appointments and care.  [facility] [phone]

## 2022-12-22 NOTE — Progress Notes (Signed)
After pt got treatment and Dr. Janese Banks wanted to see her sores on legs. It was wrapped in gauze bandage. I took it off of left leg. She has redness and some swelling. She has an open wound and dr Janese Banks took picture and per the pt. She has home health to come and change bandage

## 2022-12-22 NOTE — Patient Instructions (Signed)
[place]  Discharge Instructions: Thank you for choosing [facility] to provide your oncology and hematology care.  If you have a lab appointment with the [facility], please go directly to the [facility] and check in at the registration area.  Wear comfortable clothing and clothing appropriate for easy access to any Portacath or PICC line.   We strive to give you quality time with your provider. You may need to reschedule your appointment if you arrive late (15 or more minutes).  Arriving late affects you and other patients whose appointments are after yours.  Also, if you miss three or more appointments without notifying the office, you may be dismissed from the clinic at the provider's discretion.      For prescription refill requests, have your pharmacy contact our office and allow 72 hours for refills to be completed.    Today you received the following chemotherapy and/or immunotherapy agents Ruxience      To help prevent nausea and vomiting after your treatment, we encourage you to take your nausea medication as directed.  BELOW ARE SYMPTOMS THAT SHOULD BE REPORTED IMMEDIATELY: *FEVER GREATER THAN 100.4 F (38 C) OR HIGHER *CHILLS OR SWEATING *NAUSEA AND VOMITING THAT IS NOT CONTROLLED WITH YOUR NAUSEA MEDICATION *UNUSUAL SHORTNESS OF BREATH *UNUSUAL BRUISING OR BLEEDING *URINARY PROBLEMS (pain or burning when urinating, or frequent urination) *BOWEL PROBLEMS (unusual diarrhea, constipation, pain near the anus) TENDERNESS IN MOUTH AND THROAT WITH OR WITHOUT PRESENCE OF ULCERS (sore throat, sores in mouth, or a toothache) UNUSUAL RASH, SWELLING OR PAIN  UNUSUAL VAGINAL DISCHARGE OR ITCHING   Items with * indicate a potential emergency and should be followed up as soon as possible or go to the Emergency Department if any problems should occur.  Please show the CHEMOTHERAPY ALERT CARD or IMMUNOTHERAPY ALERT CARD at check-in to  the Emergency Department and triage nurse.  Should you have questions after your visit or need to cancel or reschedule your appointment, please contact [facility]  [phone] and follow the prompts.  Office hours are 8:00 a.m. to 4:30 p.m. Monday - Friday. Please note that voicemails left after 4:00 p.m. may not be returned until the following business day.  We are closed weekends and major holidays. You have access to a nurse at all times for urgent questions. Please call the main number to the clinic [phone] and follow the prompts.  For any non-urgent questions, you may also contact your provider using MyChart. We now offer e-Visits for anyone 63 and older to request care online for non-urgent symptoms. For details visit [email or web address].   Also download the MyChart app! Go to the app store, search "MyChart", open the app, select [facility], and log in with your MyChart username and password.

## 2022-12-22 NOTE — Telephone Encounter (Signed)
Call From Feliberto Harts with [facility] asking if Dr Janese Banks will sign [place] orders on this patient

## 2022-12-22 NOTE — Telephone Encounter (Signed)
yes

## 2022-12-22 NOTE — Telephone Encounter (Signed)
Call returned to Natasha [name] and left message on voice mail that Dr Janese Banks said yes she will sign orders for this patient.

## 2022-12-22 NOTE — Progress Notes (Signed)
Patient wants to know if treatment is working.

## 2022-12-22 NOTE — Transitions of Care (Post Inpatient/ED Visit) (Signed)
   12/22/2022  Name: Natasha [name] MRN: [id number] DOB: October 05, 1946  Today's TOC FU Call Status: Today's TOC FU Call Status:: Unsuccessful Call (2nd Attempt) Unsuccessful Call (2nd Attempt) Date: 12/20/22  Attempted to reach the patient regarding the most recent Inpatient/ED visit.  Follow Up Plan: Additional outreach attempts will be made to reach the patient to complete the Transitions of Care (Post Inpatient/ED visit) call.   [facility] [phone]

## 2022-12-22 NOTE — Progress Notes (Signed)
Hematology/Oncology Consult note [facility]  [phone] [phone]  Patient Care Team: [name], NP as PCP - General (Nurse Practitioner) [name], RN (Inactive) as Case Manager [name], RN as [place] Management   Name of the patient: Natasha [name]  [id number]  [birth year]   Date of visit: 12/22/22  Diagnosis-stage IV nodal marginal zone lymphoma  Chief complaint/ Reason for visit-on treatment assessment prior to cycle 4 of weekly split dose Rituxan  Heme/Onc history: Patient is a 77 year old female with a past medical history significant for type 2 diabetes COPD postherpetic neuralgia who has been referred for anemia.  Her most recent CBC from 06/22/2022 showed white count of 4.3, H&H of 8/26.6 with an MCV of 93 and a platelet count of 77.  Looking back at her prior CBCs her hemoglobin was normal at 13.3 until November 2020 and since June 2023 her hemoglobin has been fluctuating between 8.5-9.5.  Platelets were normal up until November 2020 but fluctuating between 70-80 since June 2023.  Her hemoglobin was normal at 13 in May 2021 as well and we do not have any labs between 2021 and 2023.  Patient states that she was diagnosed with a possible lymphoma a few years ago in [place] but was told that she does not require any treatment   Patient had a CT chest abdomen and pelvis with contrast which showed bulky bilateral axillary mediastinal and hilar adenopathy with the largest axillary node measuring 3.3 x 2.5 cm as compared to 1.7 cm back in 2020.  Severe splenomegaly of 21.9 cm.  Bulky intra-abdominal adenopathy.  Moderate right pleural effusion   Flow cytometry from peripheral blood that was done at [facility] showed clonal B cells which was CD5 negative CD10 negative CD103 negative and CD123 negative.  Differential diagnosis includes marginal zone lymphoma, lymphoplasmacytic lymphoma, DLBCL and atypical CLL.   Blood smear shows a small to medium lymphoid cells with irregular nuclear contours containing moderately condensed chromatin.  Large abnormal lymphocytes are rare.  Taken together these morphological and immunophenotypic findings favor the diagnosis of marginal zone lymphoma.   Patient had pleural fluid tapped as well and cytology was again consistent with involvement with patient's known B-cell lymphoma consistent with peripheral blood flow cytometry pathology.   Plan was to offer Bendamustine Rituxan chemotherapy for 6 cycles.  However patient had repeated episodes of hospitalization for respiratory failure and worsening performance status.  Plan was therefore made to proceed with acalabrutinib instead.  She started taking that on 08/31/2022   No significant response to acalabrutinib after 3 months of therapy.  Also had a major GI bleed.  Plan is to switch her to Rituxan and Revlimid with weekly Rituxan for 4 cycles followed by Rituxan Revlimid starting cycle 2 q. 28 days    Interval history-patient was admitted last week in the hospital for left lower extremity cellulitis requiring IV antibiotics.  She still has a dressing in place.  Denies any new complaints at this time  ECOG PS- 2 Pain scale- 3 Opioid associated constipation- no  Review of systems- Review of Systems  Constitutional:  Positive for malaise/fatigue. Negative for chills, fever and weight loss.  HENT:  Negative for congestion, ear discharge and nosebleeds.   Eyes:  Negative for blurred vision.  Respiratory:  Negative for cough, hemoptysis, sputum production, shortness of breath and wheezing.   Cardiovascular:  Positive for leg swelling. Negative for chest pain, palpitations, orthopnea and claudication.  Gastrointestinal:  Negative for abdominal pain, blood in stool, constipation, diarrhea, heartburn, melena, nausea and vomiting.  Genitourinary:  Negative for dysuria, flank pain, frequency, hematuria and urgency.   Musculoskeletal:  Negative for back pain, joint pain and myalgias.  Skin:  Negative for rash.  Neurological:  Negative for dizziness, tingling, focal weakness, seizures, weakness and headaches.  Endo/Heme/Allergies:  Does not bruise/bleed easily.  Psychiatric/Behavioral:  Negative for depression and suicidal ideas. The patient does not have insomnia.       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   CHF (congestive heart failure) (HCC)    COPD (chronic obstructive pulmonary disease) (HCC)    Diabetes mellitus without complication (HCC)    Hypotension    Pleural effusion    [facility] 875m,  2 weeks ago   Vaginal delivery    x 5     Past Surgical History:  Procedure Laterality Date   IR IMAGING GUIDED PORT INSERTION  08/16/2022   TONSILLECTOMY Bilateral    as a child    Social History   Socioeconomic History   Marital status: Widowed    Spouse name: Not on file   Number of children: 2   Years of education: Not on file   Highest education level: 9th grade  Occupational History   Occupation: unemployed  Tobacco Use   Smoking status: Former    Packs/day: 2.00    Years: 55.00    Total pack years: 110.00    Types: Cigarettes    Start date: 07/07/1961    Quit date: 08/02/2022    Years since quitting: 0.3   Smokeless tobacco: Never   Tobacco comments:    1/2 pack she states but family says 2 PPD  Vaping Use   Vaping Use: Never used  Substance and Sexual Activity   Alcohol use: No   Drug use: Never   Sexual activity: Not Currently    Partners: Male    Birth control/protection: Post-menopausal  Other Topics Concern   Not on file  Social History Narrative   08/14/20   [place]  Living: to be near daughter   Work: retired      FPhysiological scientistchildren - JIT consultant(nearby) and son JEvelena Peat[place]  8 grandchildren, & 2 great-grandchildren.      Enjoys: stays at home       Exercise: walking to her daughter's store   Diet: eats fruit, air fried chicken      Safety   Seat belts: Yes    Guns: No   Safe in relationships: Yes    Social Determinants of Health   Financial Resource Strain: Medium Risk (08/24/2022)   Overall Financial Resource Strain (CARDIA)    Difficulty of Paying Living Expenses: Somewhat hard  Food Insecurity: No Food Insecurity (12/15/2022)   Hunger Vital Sign    Worried About Running Out of Food in the Last Year: Never true    Ran Out of Food in the Last Year: Never true  Transportation Needs: Unmet Transportation Needs (12/22/2022)   PRAPARE - Transportation    Lack of Transportation (Medical): Yes    Lack of Transportation (Non-Medical): Yes  Physical Activity: Inactive (08/24/2022)   Exercise Vital Sign    Days of Exercise per Week: 0 days    Minutes of Exercise per Session: 0 min  Stress: Stress Concern Present (08/24/2022)  [place] Questionnaire    Feeling of Stress : To some extent  Social Connections: Socially Isolated (08/24/2022)   Social Connection and Isolation Panel [NHANES]    Frequency of Communication with Friends and Family: More than three times a week    Frequency of Social Gatherings with Friends and Family: More than three times a week    Attends Religious Services: Never    Marine scientist or Organizations: No    Attends Archivist Meetings: Never    Marital Status: Widowed  Intimate Partner Violence: Not At Risk (12/15/2022)   Humiliation, Afraid, Rape, and Kick questionnaire    Fear of Current or Ex-Partner: No    Emotionally Abused: No    Physically Abused: No    Sexually Abused: No    Family History  Problem Relation Age of Onset   Cancer Mother    Breast cancer Mother    Cancer Father    Alzheimer's disease Father    Lung cancer Father        lung   Varicose Veins Brother    Heart attack Brother      Current Outpatient  Medications:    acyclovir (ZOVIRAX) 400 MG tablet, Take 400 mg by mouth daily., Disp: , Rfl:    albuterol (VENTOLIN HFA) 108 (90 Base) MCG/ACT inhaler, TAKE 2 PUFFS BY MOUTH EVERY 6 HOURS AS NEEDED FOR WHEEZE OR SHORTNESS OF BREATH, Disp: 18 g, Rfl: 1   Blood Glucose Monitoring Suppl DEVI, 1 each by Does not apply route in the morning, at noon, and at bedtime. May substitute to any manufacturer covered by patient's insurance., Disp: 1 each, Rfl: 0   cephALEXin (KEFLEX) 500 MG capsule, Take 1 capsule (500 mg total) by mouth every 6 (six) hours for 5 days., Disp: 20 capsule, Rfl: 0   clobetasol cream (TEMOVATE) [id number] %, Apply 1 Application topically daily., Disp: 30 g, Rfl: 0   furosemide (LASIX) 20 MG tablet, Take 1 tablet (20 mg total) by mouth daily., Disp: 90 tablet, Rfl: 3   Glucose Blood (BLOOD GLUCOSE TEST STRIPS) STRP, 1 each by In Vitro route in the morning, at noon, and at bedtime. May substitute to any manufacturer covered by patient's insurance., Disp: 100 strip, Rfl: 0   guaiFENesin-dextromethorphan (ROBITUSSIN DM) 100-10 MG/5ML syrup, Take 5 mLs by mouth every 4 (four) hours as needed for cough., Disp: 118 mL, Rfl: 0   insulin NPH-regular Human (70-30) 100 UNIT/ML injection, Inject 4 Units into the skin 2 (two) times daily with a meal. Use only for hyperglycemia, CBG+ 300. Start with 5 units, titrate up to max 10 unit per dose if need., Disp: 10 mL, Rfl: 0   ipratropium-albuterol (DUONEB) 0.5-2.5 (3) MG/3ML SOLN, Take 3 mLs by nebulization every 6 (six) hours as needed (shortness of breath or wheezing)., Disp: 360 mL, Rfl: 0   Lancet Device MISC, 1 each by Does not apply route in the morning, at noon, and at bedtime. May substitute to any manufacturer covered by patient's insurance., Disp: 1 each, Rfl: 0   Lancets Misc. MISC, 1 each by Does not apply route in the morning, at noon, and at bedtime. May substitute to any manufacturer covered by patient's insurance., Disp: 100 each, Rfl: 0    lenalidomide (REVLIMID) 10 MG capsule, Take 1 capsule (10 mg total) by mouth daily. Take for 14 days, then hold for 7 days., Disp: 14 capsule, Rfl: 0   levothyroxine (SYNTHROID) 50  MCG tablet, Take 1 tablet (50 mcg total) by mouth daily at 6 (six) AM., Disp: 30 tablet, Rfl: 0   loperamide (IMODIUM) 2 MG capsule, Take 2 mg by mouth as needed for diarrhea or loose stools., Disp: , Rfl:    midodrine (PROAMATINE) 10 MG tablet, Take 1 tablet (10 mg total) by mouth 3 (three) times daily with meals., Disp: 60 tablet, Rfl: 0   nystatin-triamcinolone ointment (MYCOLOG), Apply 1 Application topically 2 (two) times daily., Disp: 30 g, Rfl: 0   ondansetron (ZOFRAN) 8 MG tablet, Take 8 mg by mouth every 8 (eight) hours as needed for nausea, vomiting or refractory nausea / vomiting. Take 1 tablet q6H for 4 days after chemotherapy., Disp: , Rfl:    prochlorperazine (COMPAZINE) 10 MG tablet, Take 1 tablet by mouth every 6 (six) hours as needed for nausea or vomiting. Take 1 tab q6H for 4 days after chemotherapy., Disp: , Rfl:    simethicone (MYLICON) 80 MG chewable tablet, Chew 180 mg by mouth every 6 (six) hours as needed for flatulence. Take 1 capsule PO PRN gas relief., Disp: , Rfl:    Tenofovir Alafenamide Fumarate (VEMLIDY) 25 MG TABS, Take 1 tablet (25 mg total) by mouth daily., Disp: 30 tablet, Rfl: 3   traMADol (ULTRAM) 50 MG tablet, Take 1 tablet (50 mg total) by mouth every 6 (six) hours as needed for moderate pain or severe pain., Disp: 30 tablet, Rfl: 0   pantoprazole (PROTONIX) 40 MG tablet, Take 1 tablet (40 mg total) by mouth daily., Disp: 30 tablet, Rfl: 0 No current facility-administered medications for this visit.  Facility-Administered Medications Ordered in Other Visits:    heparin lock flush 100 unit/mL, 500 Units, Intracatheter, Once PRN, Sindy Guadeloupe, MD  Physical exam:  Vitals:   12/22/22 0820  BP: 137/72  Pulse: 81  Resp: 18  Temp: 98.3 F (36.8 C)  TempSrc: Tympanic  SpO2: 100%   Weight: 176 lb (79.8 kg)  Height: '5\' 5"'$  (1.651 m)   Physical Exam Constitutional:      General: She is not in acute distress. Cardiovascular:     Rate and Rhythm: Normal rate and regular rhythm.     Heart sounds: Normal heart sounds.  Pulmonary:     Effort: Pulmonary effort is normal.     Breath sounds: Normal breath sounds.  Musculoskeletal:     Comments: Chronic bilateral lower extremity edema.  She has a dressing in place over bilateral lower extremities.  Skin:    General: Skin is warm and dry.  Neurological:     Mental Status: She is alert and oriented to person, place, and time.         Latest Ref Rng & Units 12/22/2022    8:01 AM  CMP  Glucose 70 - 99 mg/dL 180   BUN 8 - 23 mg/dL 38   Creatinine 0.44 - 1.00 mg/dL 0.62   Sodium 135 - 145 mmol/L 142   Potassium 3.5 - 5.1 mmol/L 4.4   Chloride 98 - 111 mmol/L 100   CO2 22 - 32 mmol/L 35   Calcium 8.9 - 10.3 mg/dL 10.1   Total Protein 6.5 - 8.1 g/dL 5.3   Total Bilirubin 0.3 - 1.2 mg/dL 0.6   Alkaline Phos 38 - 126 U/L 99   AST 15 - 41 U/L 17   ALT 0 - 44 U/L 13       Latest Ref Rng & Units 12/22/2022    8:01 AM  CBC  WBC 4.0 - 10.5 K/uL 1.2   Hemoglobin 12.0 - 15.0 g/dL 8.3   Hematocrit 36.0 - 46.0 % 27.3   Platelets 150 - 400 K/uL 154     No images are attached to the encounter.  DG Chest 2 View  Result Date: 12/15/2022 CLINICAL DATA:  Shortness of breath, increased EXAM: CHEST - 2 VIEW COMPARISON:  12/07/2022 FINDINGS: RIGHT jugular Port-A-Cath with tip projecting over cavoatrial junction. Enlargement of cardiac silhouette with pulmonary vascular congestion. Diffuse infiltrates consistent with pulmonary edema. Interval increase in BILATERAL pleural effusions and bibasilar atelectasis. No pneumothorax or acute osseous findings. IMPRESSION: Increased pulmonary edema and bibasilar pleural effusions/atelectasis. Electronically Signed   By: Lavonia Dana M.D.   On: 12/15/2022 16:27   DG Tibia/Fibula  Left  Result Date: 12/14/2022 CLINICAL DATA:  Leg pain swelling EXAM: LEFT TIBIA AND FIBULA - 2 VIEW COMPARISON:  None Available. FINDINGS: No fracture or malalignment. No soft tissue emphysema. No periostitis or osseous destructive change. 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 Diffuse soft tissue swelling IMPRESSION: No acute osseous abnormality. 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 Electronically Signed   By: Donavan Foil M.D.   On: 12/14/2022 18:34   US Venous Img Lower Unilateral Left  Result Date: 12/14/2022 CLINICAL DATA:  left leg pain EXAM: LEFT LOWER EXTREMITY VENOUS DOPPLER ULTRASOUND TECHNIQUE: Gray-scale sonography with compression, as well as color and duplex ultrasound, were performed to evaluate the deep venous system(s) from the level of the common femoral vein through the popliteal and proximal calf veins. COMPARISON:  None Available. FINDINGS: VENOUS Normal compressibility of the common femoral, superficial femoral, and popliteal veins, as well as the visualized calf veins. Visualized portions of profunda femoral vein and great saphenous vein unremarkable. No filling defects to suggest DVT on grayscale or color Doppler imaging. Doppler waveforms show normal direction of venous flow, normal respiratory plasticity and response to augmentation. Limited views of the contralateral common femoral vein are unremarkable. OTHER Multiple lymph nodes in the groins noted. The largest measures approximately 4.2 x 1.3 cm in the left groin. IMPRESSION: No evidence of DVT in the left lower extremity. Electronically Signed   By: Margaretha Sheffield M.D.   On: 12/14/2022 18:22   US THORACENTESIS ASP PLEURAL SPACE W/IMG GUIDE  Result Date: 12/07/2022 INDICATION: History of gastric lymphoma with recurrent pleural effusions. Request received for diagnostic and therapeutic thoracentesis. Upon ultrasound exam right larger than left. EXAM: ULTRASOUND GUIDED DIAGNOSTIC AND THERAPEUTIC RIGHT THORACENTESIS MEDICATIONS: 10 mL 1 %  lidocaine COMPLICATIONS: None immediate. PROCEDURE: An ultrasound guided thoracentesis was thoroughly discussed with the patient and questions answered. The benefits, risks, alternatives and complications were also discussed. The patient understands and wishes to proceed with the procedure. Written consent was obtained. Ultrasound was performed to localize and mark an adequate pocket of fluid in the right chest. The area was then prepped and draped in the normal sterile fashion. 1% Lidocaine was used for local anesthesia. Under ultrasound guidance a 6 Fr Safe-T-Centesis catheter was introduced. Thoracentesis was performed. The catheter was removed and a dressing applied. FINDINGS: A total of approximately 1.2 L of clear, amber fluid was removed. Samples were sent to the laboratory as requested by the clinical team. IMPRESSION: Successful ultrasound guided right thoracentesis yielding 1.2 L of pleural fluid. Read by: Narda Rutherford, AGNP-BC Electronically Signed   By: Albin Felling M.D.   On: 12/07/2022 16:19   DG Chest Port 1 View  Result Date: 12/07/2022 CLINICAL DATA:  Status post right thoracentesis. EXAM: PORTABLE CHEST  1 VIEW COMPARISON:  12/01/2022 and chest CT 11/07/2022 FINDINGS: Improved aeration in the right lower lung compatible with recent thoracentesis. Residual densities at the right lung base compatible with areas of volume loss and pleural fluid. There is a nodular density in the mid right lung which corresponds with the lesion seen on the previous CT. This nodule lesion measures roughly 2.2 cm. Right jugular Port-A-Cath with the tip near the superior cavoatrial junction. Persistent left basilar densities compatible with pleural fluid and compressive atelectasis. Patchy interstitial densities in both lungs are similar to the previous chest CT. Negative for a pneumothorax. IMPRESSION: 1. Improved aeration in the right lower lung compatible with recent thoracentesis. Negative for a pneumothorax. 2.  Residual densities at the lung bases compatible with pleural effusions and compressive atelectasis/consolidation. 3. Nodular density in the mid right lung corresponds with the lesion seen on the previous CT. 4. Persistent patchy interstitial densities in both lungs. Electronically Signed   By: Markus Daft M.D.   On: 12/07/2022 16:04   DG Chest 2 View  Result Date: 12/03/2022 CLINICAL DATA:  Pleural effusions.  Assess for amount of fluid. EXAM: CHEST - 2 VIEW COMPARISON:  11/10/2022.  CT, 11/07/2022. FINDINGS: Moderate left and moderate to large right pleural effusions, right increased from the prior exam, left likely also increased, but to a lesser degree. Central vascular congestion. Hazy perihilar opacity and more confluent lung base opacity. Lung apices are essentially clear. No pneumothorax. Cardiac silhouette is obscured. No gross mediastinal mass. No convincing hilar mass. Right anterior chest wall Port-A-Cath is stable. Skeletal structures are grossly intact. IMPRESSION: 1. Mild interval increase in the size of the right pleural effusion, now moderate to large. Moderate left pleural effusion suspected to be slightly increased from the recent prior study. 2. Vascular congestion. Central hazy perihilar airspace opacities and more confluent lung base opacities. Lung opacities may reflect atelectasis, edema or a combination, similar in appearance to the prior exam. Electronically Signed   By: Lajean Manes M.D.   On: 12/03/2022 15:58     Assessment and plan- Patient is a 77 y.o. female with history of stage IV nodal marginal zone lymphoma here for on treatment assessment prior to cycle 4 of weekly split dose Rituxan  Patient has baseline significant leukopenia/neutropenia secondary to her underlying lymphoma.  Anemia is also likely secondary to her lymphoma.  Previously patient had thrombocytopenia with a platelet count between 50s to 70s but now that has normalized possibly indicating response to  treatment.  We have not been able to get an outpatient PET scan due to patient being hospitalized each time that the PET scan was scheduled.  She had a CT chest abdomen pelvis with contrast in January 2024 after she was on [place] for 3 months and has not responded to that.  I plan is to repeat another CT sometime in April 2024.  She will proceed with cycle 4 of weekly Rituxan today split dose on day 1 and day 2.  I will see her back in 1 week to start monthly Rituxan.  I will discuss Revlimid with her next week which she will take 5 mg to start with 3 weeks on and 1 week off.  She is at high risk of DVT given her relative immobility and chronic lymphedema involving bilateral lower extremities.  I will consider Eliquis 2.5 mg twice daily as thromboprophylaxis for her   Visit Diagnosis 1. Nodal marginal zone B-cell lymphoma ([place])   2. Encounter for monitoring rituximab  therapy      Dr. Randa Evens, MD, MPH [facility] [id number] 12/22/2022 12:41 PM

## 2022-12-23 ENCOUNTER — Inpatient Hospital Stay: Payer: 59

## 2022-12-23 ENCOUNTER — Other Ambulatory Visit: Payer: Self-pay

## 2022-12-23 ENCOUNTER — Encounter: Payer: Self-pay | Admitting: Oncology

## 2022-12-23 ENCOUNTER — Ambulatory Visit: Payer: Self-pay

## 2022-12-23 VITALS — BP 118/63 | HR 82 | Temp 97.8°F | Resp 18

## 2022-12-23 DIAGNOSIS — R0602 Shortness of breath: Secondary | ICD-10-CM | POA: Diagnosis not present

## 2022-12-23 DIAGNOSIS — N644 Mastodynia: Secondary | ICD-10-CM | POA: Diagnosis not present

## 2022-12-23 DIAGNOSIS — I509 Heart failure, unspecified: Secondary | ICD-10-CM | POA: Diagnosis not present

## 2022-12-23 DIAGNOSIS — J90 Pleural effusion, not elsewhere classified: Secondary | ICD-10-CM | POA: Diagnosis not present

## 2022-12-23 DIAGNOSIS — C8300 Small cell B-cell lymphoma, unspecified site: Secondary | ICD-10-CM

## 2022-12-23 DIAGNOSIS — K8020 Calculus of gallbladder without cholecystitis without obstruction: Secondary | ICD-10-CM | POA: Diagnosis not present

## 2022-12-23 DIAGNOSIS — M7989 Other specified soft tissue disorders: Secondary | ICD-10-CM | POA: Diagnosis not present

## 2022-12-23 DIAGNOSIS — M4856XA Collapsed vertebra, not elsewhere classified, lumbar region, initial encounter for fracture: Secondary | ICD-10-CM | POA: Diagnosis not present

## 2022-12-23 DIAGNOSIS — I2510 Atherosclerotic heart disease of native coronary artery without angina pectoris: Secondary | ICD-10-CM | POA: Diagnosis not present

## 2022-12-23 DIAGNOSIS — D6959 Other secondary thrombocytopenia: Secondary | ICD-10-CM | POA: Diagnosis not present

## 2022-12-23 DIAGNOSIS — Z794 Long term (current) use of insulin: Secondary | ICD-10-CM | POA: Diagnosis not present

## 2022-12-23 DIAGNOSIS — M5136 Other intervertebral disc degeneration, lumbar region: Secondary | ICD-10-CM | POA: Diagnosis not present

## 2022-12-23 DIAGNOSIS — R590 Localized enlarged lymph nodes: Secondary | ICD-10-CM | POA: Diagnosis not present

## 2022-12-23 DIAGNOSIS — M47814 Spondylosis without myelopathy or radiculopathy, thoracic region: Secondary | ICD-10-CM | POA: Diagnosis not present

## 2022-12-23 DIAGNOSIS — E119 Type 2 diabetes mellitus without complications: Secondary | ICD-10-CM | POA: Diagnosis not present

## 2022-12-23 DIAGNOSIS — Z79620 Long term (current) use of immunosuppressive biologic: Secondary | ICD-10-CM | POA: Diagnosis not present

## 2022-12-23 DIAGNOSIS — K922 Gastrointestinal hemorrhage, unspecified: Secondary | ICD-10-CM | POA: Diagnosis not present

## 2022-12-23 DIAGNOSIS — C8308 Small cell B-cell lymphoma, lymph nodes of multiple sites: Secondary | ICD-10-CM | POA: Diagnosis not present

## 2022-12-23 DIAGNOSIS — J449 Chronic obstructive pulmonary disease, unspecified: Secondary | ICD-10-CM | POA: Diagnosis not present

## 2022-12-23 DIAGNOSIS — D649 Anemia, unspecified: Secondary | ICD-10-CM | POA: Diagnosis not present

## 2022-12-23 DIAGNOSIS — Z5112 Encounter for antineoplastic immunotherapy: Secondary | ICD-10-CM | POA: Diagnosis not present

## 2022-12-23 DIAGNOSIS — R5383 Other fatigue: Secondary | ICD-10-CM | POA: Diagnosis not present

## 2022-12-23 DIAGNOSIS — J948 Other specified pleural conditions: Secondary | ICD-10-CM | POA: Diagnosis not present

## 2022-12-23 MED ORDER — SODIUM CHLORIDE 0.9 % IV SOLN
10.0000 mg | Freq: Once | INTRAVENOUS | Status: AC
  Administered 2022-12-23: 10 mg via INTRAVENOUS
  Filled 2022-12-23: qty 10

## 2022-12-23 MED ORDER — SODIUM CHLORIDE 0.9 % IV SOLN
300.0000 mg | Freq: Once | INTRAVENOUS | Status: AC
  Administered 2022-12-23: 300 mg via INTRAVENOUS
  Filled 2022-12-23: qty 30

## 2022-12-23 MED ORDER — ACETAMINOPHEN 325 MG PO TABS
650.0000 mg | ORAL_TABLET | Freq: Once | ORAL | Status: AC
  Administered 2022-12-23: 650 mg via ORAL
  Filled 2022-12-23: qty 2

## 2022-12-23 MED ORDER — HEPARIN SOD (PORK) LOCK FLUSH 100 UNIT/ML IV SOLN
INTRAVENOUS | Status: AC
  Filled 2022-12-23: qty 5

## 2022-12-23 MED ORDER — HEPARIN SOD (PORK) LOCK FLUSH 100 UNIT/ML IV SOLN
INTRAVENOUS | Status: AC
  Administered 2022-12-23: 500 [IU]
  Filled 2022-12-23: qty 5

## 2022-12-23 MED ORDER — DIPHENHYDRAMINE HCL 25 MG PO CAPS
50.0000 mg | ORAL_CAPSULE | Freq: Once | ORAL | Status: AC
  Administered 2022-12-23: 50 mg via ORAL
  Filled 2022-12-23: qty 2

## 2022-12-23 MED ORDER — HEPARIN SOD (PORK) LOCK FLUSH 100 UNIT/ML IV SOLN
500.0000 [IU] | Freq: Once | INTRAVENOUS | Status: AC | PRN
  Filled 2022-12-23: qty 5

## 2022-12-23 MED ORDER — SODIUM CHLORIDE 0.9 % IV SOLN
Freq: Once | INTRAVENOUS | Status: AC
  Filled 2022-12-23: qty 250

## 2022-12-23 NOTE — Telephone Encounter (Signed)
Spoke with Natasha [name] in infusion to let her know a prescription was sent to [facility] for her Revlimid.   Called patient's daughter [name] to also let her know the medication was sent to [facility]. She would like to be the primary contact for medication filling. Sent the request via email to [facility].   Both patient and [name] were informed that she should not start the Revlimid until given the go ahead by DR. Janese Banks.   [name] knows to contact [facility] on Monday 12/27/22 if she has not heard from them about medication delivery.

## 2022-12-23 NOTE — Patient Instructions (Signed)
[place] [facility]  Discharge Instructions: Thank you for choosing [facility] to provide your oncology and hematology care.  If you have a lab appointment with the [facility], please go directly to the [facility] and check in at the registration area.  Wear comfortable clothing and clothing appropriate for easy access to any Portacath or PICC line.   We strive to give you quality time with your provider. You may need to reschedule your appointment if you arrive late (15 or more minutes).  Arriving late affects you and other patients whose appointments are after yours.  Also, if you miss three or more appointments without notifying the office, you may be dismissed from the clinic at the provider's discretion.      For prescription refill requests, have your pharmacy contact our office and allow 72 hours for refills to be completed.    Today you received the following chemotherapy and/or immunotherapy agents Rituximab      To help prevent nausea and vomiting after your treatment, we encourage you to take your nausea medication as directed.  BELOW ARE SYMPTOMS THAT SHOULD BE REPORTED IMMEDIATELY: *FEVER GREATER THAN 100.4 F (38 C) OR HIGHER *CHILLS OR SWEATING *NAUSEA AND VOMITING THAT IS NOT CONTROLLED WITH YOUR NAUSEA MEDICATION *UNUSUAL SHORTNESS OF BREATH *UNUSUAL BRUISING OR BLEEDING *URINARY PROBLEMS (pain or burning when urinating, or frequent urination) *BOWEL PROBLEMS (unusual diarrhea, constipation, pain near the anus) TENDERNESS IN MOUTH AND THROAT WITH OR WITHOUT PRESENCE OF ULCERS (sore throat, sores in mouth, or a toothache) UNUSUAL RASH, SWELLING OR PAIN  UNUSUAL VAGINAL DISCHARGE OR ITCHING   Items with * indicate a potential emergency and should be followed up as soon as possible or go to the Emergency Department if any problems should occur.  Please show the CHEMOTHERAPY ALERT CARD or IMMUNOTHERAPY ALERT CARD at check-in to  the Emergency Department and triage nurse.  Should you have questions after your visit or need to cancel or reschedule your appointment, please contact [facility]  [phone] and follow the prompts.  Office hours are 8:00 a.m. to 4:30 p.m. Monday - Friday. Please note that voicemails left after 4:00 p.m. may not be returned until the following business day.  We are closed weekends and major holidays. You have access to a nurse at all times for urgent questions. Please call the main number to the clinic [phone] and follow the prompts.  For any non-urgent questions, you may also contact your provider using MyChart. We now offer e-Visits for anyone 15 and older to request care online for non-urgent symptoms. For details visit [email or web address].   Also download the MyChart app! Go to the app store, search "MyChart", open the app, select [facility], and log in with your MyChart username and password.

## 2022-12-23 NOTE — Telephone Encounter (Signed)
Oral Chemotherapy Pharmacist Encounter   Received notification from Dr. Janese Banks that patient is now ready to start Revlimid (lenalidomide). She is wanting to start patient on the reduced dose of '5mg'$ , 21 days on and 7 days off due to ongoing neutropenia. 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 Janese Banks hopes this will improve with continued treatment.    Darl Pikes, PharmD, BCPS, BCOP, CPP Hematology/Oncology Clinical Pharmacist [place]/DB/AP Oral [facility] [phone]  12/23/2022 11:24 AM

## 2022-12-23 NOTE — Patient Outreach (Deleted)
  Care Coordination   Follow Up Visit Note   12/23/2022 Name: Natasha [name] MRN: [id number] DOB: [birth year]  Natasha [name] is a 77 y.o. year old female who sees Natasha Lo, NP for primary care. I spoke with  daughter, Natasha [name].  What matters to the patients health and wellness today?  ***    Goals Addressed   None     SDOH assessments and interventions completed:  No{THN Tip this will not be part of the note when signed-REQUIRED REPORT FIELD DO NOT DELETE (Optional):27901}     Care Coordination Interventions:  Yes, provided {THN Tip this will not be part of the note when signed-REQUIRED REPORT FIELD DO NOT DELETE (Optional):27901}  Follow up plan: {CCFOLLOWUP:27768}   Encounter Outcome:  {ENCOUTCOME:27770} {THN Tip this will not be part of the note when signed-REQUIRED REPORT FIELD DO NOT DELETE (Optional):27901}

## 2022-12-24 ENCOUNTER — Telehealth: Payer: Self-pay

## 2022-12-24 ENCOUNTER — Other Ambulatory Visit (HOSPITAL_COMMUNITY): Payer: Self-pay

## 2022-12-24 ENCOUNTER — Telehealth: Payer: Self-pay | Admitting: Nurse Practitioner

## 2022-12-24 ENCOUNTER — Inpatient Hospital Stay: Payer: Medicaid Other | Admitting: Nurse Practitioner

## 2022-12-24 DIAGNOSIS — L03116 Cellulitis of left lower limb: Secondary | ICD-10-CM

## 2022-12-24 DIAGNOSIS — Z9981 Dependence on supplemental oxygen: Secondary | ICD-10-CM | POA: Diagnosis not present

## 2022-12-24 DIAGNOSIS — Z794 Long term (current) use of insulin: Secondary | ICD-10-CM | POA: Diagnosis not present

## 2022-12-24 DIAGNOSIS — D630 Anemia in neoplastic disease: Secondary | ICD-10-CM | POA: Diagnosis not present

## 2022-12-24 DIAGNOSIS — E119 Type 2 diabetes mellitus without complications: Secondary | ICD-10-CM | POA: Diagnosis not present

## 2022-12-24 DIAGNOSIS — I509 Heart failure, unspecified: Secondary | ICD-10-CM | POA: Diagnosis not present

## 2022-12-24 DIAGNOSIS — D701 Agranulocytosis secondary to cancer chemotherapy: Secondary | ICD-10-CM

## 2022-12-24 DIAGNOSIS — D709 Neutropenia, unspecified: Secondary | ICD-10-CM | POA: Diagnosis not present

## 2022-12-24 DIAGNOSIS — J449 Chronic obstructive pulmonary disease, unspecified: Secondary | ICD-10-CM | POA: Diagnosis not present

## 2022-12-24 DIAGNOSIS — B0229 Other postherpetic nervous system involvement: Secondary | ICD-10-CM | POA: Diagnosis not present

## 2022-12-24 DIAGNOSIS — Z79899 Other long term (current) drug therapy: Secondary | ICD-10-CM | POA: Diagnosis not present

## 2022-12-24 DIAGNOSIS — Z79891 Long term (current) use of opiate analgesic: Secondary | ICD-10-CM | POA: Diagnosis not present

## 2022-12-24 DIAGNOSIS — C884 Extranodal marginal zone B-cell lymphoma of mucosa-associated lymphoid tissue [MALT-lymphoma]: Secondary | ICD-10-CM | POA: Diagnosis not present

## 2022-12-24 DIAGNOSIS — E039 Hypothyroidism, unspecified: Secondary | ICD-10-CM | POA: Diagnosis not present

## 2022-12-24 NOTE — Telephone Encounter (Signed)
Pt did not arrive for appointment today, phone call to [name], daughter. Could hear patient answering questions in the background.  [name] apologized for missing appointment, she reported that pt had chemo appointment yesterday and PT coming later today, she did not remember that her mother had this appointment. She reported that her mother has had "a lot of appointments to keep up with."  Made an appointment for next Friday 12/31/22 with [name], NP.

## 2022-12-24 NOTE — Patient Outreach (Unsigned)
  Care Coordination   Follow Up Visit Note   12/26/2022 Late entry for 12/23/22 Name: Getrude Cansler Schoof MRN: [id number] DOB: 1946/03/02  Prentice Docker Przybysz is a 77 y.o. year old female who sees [name], NP for primary care. I  spoke with [name] today   What matters to the patients health and wellness today?  Daughter states patient hospitalized from 12/14/22 to 12/17/22 due to left leg cellulitis.  Daughter states patient's home health services has resumed.  She states cancer center to clean and re-wrap patients left leg.  Daughter states patient continues on oral antibiotics. Daughter states patient received her DEXCOM and she will assist patient with application. Daughter states patient has at least 15 empty E-tanks at her home. She states she called to see if [employer] would pick them up but states she was told she would have to bring them to the [facility] office.  Daughter states she is unable to transport all of the E-Tanks.  Daughter states patient's D-tanks run low when she has to go for her cancer treatments. She states the D-tanks are not lasting and it would be to much for patient to have to transport the E-tank to her appointments.   Contact made with Pamala Hurry at [facility]. Requested empty E-tanks be picked up from patients home also explained family/ patient concern about D-tanks not lasting for duration of patients cancer treatments.   Pamala Hurry spoke with Freight forwarder and received approval for E-tank pick up.  Pamala Hurry states since patient now at 4L of O2 instead of 2 new order would be needed from provider requesting prefilled Oxygen tanks.  Pamala Hurry states she thinks this system would work better for patient at this time.    Goals Addressed             This Visit's Progress    Patient / caregiver stated:  Management of health conditions       Interventions Today    Flowsheet Row Most Recent Value  Chronic Disease   Chronic disease during today's visit Other, Chronic Obstructive Pulmonary  Disease (COPD), Diabetes  [Left leg cellulitis, chronic respiratory failure]  General Interventions   General Interventions Discussed/Reviewed General Interventions Reviewed, Doctor Visits  [Confirmed home health services resumed.  Daughter advised to contact home health agency to voice concerns about home health aid.  Discussed treatment plan regarding left leg wound/ cellulitis care.]  Doctor Visits Discussed/Reviewed Doctor Visits Reviewed  [Reviewed scheduled/ upcoming appointments]     Discussed oxygen equipment concerns. Called [employer] and spoke with Pamala Hurry regarding in home pick up of empty E tanks.  Message sent to primary care provider requesting order to be sent to [employer] requesting prefilled tanks for oxygen supplies Confirmed daughter knows how to apply patients DEXCOM.                 SDOH assessments and interventions completed:  No     Care Coordination Interventions:  Yes, provided   Follow up plan: Follow up call scheduled for 01/25/23    Encounter Outcome:  Pt. Visit Completed   Quinn Plowman RN,BSN,CCM [facility] [phone] direct line

## 2022-12-24 NOTE — Telephone Encounter (Signed)
[name], [phone]. When patient was last seen by a nurse on 12/21/22 her weight was 171, today her weight is  179.6.

## 2022-12-26 NOTE — Telephone Encounter (Signed)
Noted  

## 2022-12-26 NOTE — Patient Instructions (Signed)
Visit Information  Thank you for taking time to visit with me today. Please don't hesitate to contact me if I can be of assistance to you.   Following are the goals we discussed today:  Contact provider for worsening symptoms to left leg Continue to take antibiotic until completed. Contact your provider for concerns.  Contact home health agency for your concerns regarding the home health aid Keep wound area to left leg clean and dry   Our next appointment is by telephone on 01/25/23 at 10 am  Please call the care guide team at [phone] if you need to cancel or reschedule your appointment.   If you are experiencing a Mental Health or [place] or need someone to talk to, please call the Suicide and Crisis Lifeline: 988 call 1-800-273-TALK (toll free, 24 hour hotline)  Patient verbalizes understanding of instructions and care plan provided today and agrees to view in [facility]. Active MyChart status and patient understanding of how to access instructions and care plan via MyChart confirmed with patient.     Quinn Plowman RN,BSN,CCM [facility] Coordination [phone] direct line

## 2022-12-27 ENCOUNTER — Other Ambulatory Visit (HOSPITAL_COMMUNITY): Payer: Self-pay

## 2022-12-27 DIAGNOSIS — Z9981 Dependence on supplemental oxygen: Secondary | ICD-10-CM | POA: Diagnosis not present

## 2022-12-27 DIAGNOSIS — B0229 Other postherpetic nervous system involvement: Secondary | ICD-10-CM | POA: Diagnosis not present

## 2022-12-27 DIAGNOSIS — Z794 Long term (current) use of insulin: Secondary | ICD-10-CM | POA: Diagnosis not present

## 2022-12-27 DIAGNOSIS — C884 Extranodal marginal zone B-cell lymphoma of mucosa-associated lymphoid tissue [MALT-lymphoma]: Secondary | ICD-10-CM | POA: Diagnosis not present

## 2022-12-27 DIAGNOSIS — E119 Type 2 diabetes mellitus without complications: Secondary | ICD-10-CM | POA: Diagnosis not present

## 2022-12-27 DIAGNOSIS — D630 Anemia in neoplastic disease: Secondary | ICD-10-CM | POA: Diagnosis not present

## 2022-12-27 DIAGNOSIS — I509 Heart failure, unspecified: Secondary | ICD-10-CM | POA: Diagnosis not present

## 2022-12-27 DIAGNOSIS — E039 Hypothyroidism, unspecified: Secondary | ICD-10-CM | POA: Diagnosis not present

## 2022-12-27 DIAGNOSIS — D709 Neutropenia, unspecified: Secondary | ICD-10-CM | POA: Diagnosis not present

## 2022-12-27 DIAGNOSIS — Z79891 Long term (current) use of opiate analgesic: Secondary | ICD-10-CM | POA: Diagnosis not present

## 2022-12-27 DIAGNOSIS — L03116 Cellulitis of left lower limb: Secondary | ICD-10-CM | POA: Diagnosis not present

## 2022-12-27 DIAGNOSIS — Z79899 Other long term (current) drug therapy: Secondary | ICD-10-CM | POA: Diagnosis not present

## 2022-12-27 DIAGNOSIS — J449 Chronic obstructive pulmonary disease, unspecified: Secondary | ICD-10-CM | POA: Diagnosis not present

## 2022-12-28 ENCOUNTER — Other Ambulatory Visit: Payer: Self-pay | Admitting: Nurse Practitioner

## 2022-12-29 ENCOUNTER — Inpatient Hospital Stay: Payer: 59

## 2022-12-29 ENCOUNTER — Inpatient Hospital Stay (HOSPITAL_BASED_OUTPATIENT_CLINIC_OR_DEPARTMENT_OTHER): Payer: 59 | Admitting: Oncology

## 2022-12-29 ENCOUNTER — Inpatient Hospital Stay: Payer: 59 | Admitting: Pharmacist

## 2022-12-29 ENCOUNTER — Inpatient Hospital Stay: Payer: 59 | Attending: Oncology

## 2022-12-29 ENCOUNTER — Telehealth: Payer: Self-pay

## 2022-12-29 ENCOUNTER — Other Ambulatory Visit: Payer: Self-pay

## 2022-12-29 ENCOUNTER — Other Ambulatory Visit (HOSPITAL_COMMUNITY): Payer: Self-pay

## 2022-12-29 ENCOUNTER — Encounter: Payer: Self-pay | Admitting: Oncology

## 2022-12-29 VITALS — BP 129/65 | HR 79 | Temp 97.5°F | Resp 16

## 2022-12-29 VITALS — BP 121/71 | HR 82 | Temp 97.6°F | Resp 18 | Ht 65.0 in | Wt 176.3 lb

## 2022-12-29 DIAGNOSIS — C8300 Small cell B-cell lymphoma, unspecified site: Secondary | ICD-10-CM | POA: Diagnosis not present

## 2022-12-29 DIAGNOSIS — Z79620 Long term (current) use of immunosuppressive biologic: Secondary | ICD-10-CM

## 2022-12-29 DIAGNOSIS — Z8249 Family history of ischemic heart disease and other diseases of the circulatory system: Secondary | ICD-10-CM | POA: Diagnosis not present

## 2022-12-29 DIAGNOSIS — J449 Chronic obstructive pulmonary disease, unspecified: Secondary | ICD-10-CM | POA: Insufficient documentation

## 2022-12-29 DIAGNOSIS — Z5181 Encounter for therapeutic drug level monitoring: Secondary | ICD-10-CM

## 2022-12-29 DIAGNOSIS — Z803 Family history of malignant neoplasm of breast: Secondary | ICD-10-CM | POA: Diagnosis not present

## 2022-12-29 DIAGNOSIS — Z7901 Long term (current) use of anticoagulants: Secondary | ICD-10-CM | POA: Insufficient documentation

## 2022-12-29 DIAGNOSIS — Z809 Family history of malignant neoplasm, unspecified: Secondary | ICD-10-CM | POA: Diagnosis not present

## 2022-12-29 DIAGNOSIS — C8308 Small cell B-cell lymphoma, lymph nodes of multiple sites: Secondary | ICD-10-CM | POA: Insufficient documentation

## 2022-12-29 DIAGNOSIS — D61818 Other pancytopenia: Secondary | ICD-10-CM | POA: Diagnosis not present

## 2022-12-29 DIAGNOSIS — Z801 Family history of malignant neoplasm of trachea, bronchus and lung: Secondary | ICD-10-CM | POA: Insufficient documentation

## 2022-12-29 DIAGNOSIS — Z5112 Encounter for antineoplastic immunotherapy: Secondary | ICD-10-CM | POA: Diagnosis present

## 2022-12-29 DIAGNOSIS — Z79899 Other long term (current) drug therapy: Secondary | ICD-10-CM | POA: Diagnosis not present

## 2022-12-29 DIAGNOSIS — E119 Type 2 diabetes mellitus without complications: Secondary | ICD-10-CM | POA: Insufficient documentation

## 2022-12-29 DIAGNOSIS — Z818 Family history of other mental and behavioral disorders: Secondary | ICD-10-CM | POA: Insufficient documentation

## 2022-12-29 DIAGNOSIS — R0602 Shortness of breath: Secondary | ICD-10-CM

## 2022-12-29 DIAGNOSIS — D649 Anemia, unspecified: Secondary | ICD-10-CM | POA: Diagnosis present

## 2022-12-29 LAB — COMPREHENSIVE METABOLIC PANEL
ALT: 14 U/L (ref 0–44)
AST: 14 U/L — ABNORMAL LOW (ref 15–41)
Albumin: 3.2 g/dL — ABNORMAL LOW (ref 3.5–5.0)
Alkaline Phosphatase: 101 U/L (ref 38–126)
Anion gap: 7 (ref 5–15)
BUN: 28 mg/dL — ABNORMAL HIGH (ref 8–23)
CO2: 35 mmol/L — ABNORMAL HIGH (ref 22–32)
Calcium: 9.4 mg/dL (ref 8.9–10.3)
Chloride: 98 mmol/L (ref 98–111)
Creatinine, Ser: 0.65 mg/dL (ref 0.44–1.00)
GFR, Estimated: >60 mL/min (ref >60)
Glucose, Bld: 212 mg/dL — ABNORMAL HIGH (ref 70–99)
Potassium: 4.5 mmol/L (ref 3.5–5.1)
Sodium: 140 mmol/L (ref 135–145)
Total Bilirubin: 0.6 mg/dL (ref 0.3–1.2)
Total Protein: 5 g/dL — ABNORMAL LOW (ref 6.5–8.1)

## 2022-12-29 LAB — CBC WITH DIFFERENTIAL/PLATELET
Abs Immature Granulocytes: 0.01 10*3/uL (ref 0.00–0.07)
Basophils Absolute: 0 10*3/uL (ref 0.0–0.1)
Basophils Relative: 1 %
Eosinophils Absolute: 0.1 10*3/uL (ref 0.0–0.5)
Eosinophils Relative: 7 %
HCT: 27.3 % — ABNORMAL LOW (ref 36.0–46.0)
Hemoglobin: 8.2 g/dL — ABNORMAL LOW (ref 12.0–15.0)
Immature Granulocytes: 1 %
Lymphocytes Relative: 63 %
Lymphs Abs: 0.8 10*3/uL (ref 0.7–4.0)
MCH: 27.9 pg (ref 26.0–34.0)
MCHC: 30 g/dL (ref 30.0–36.0)
MCV: 92.9 fL (ref 80.0–100.0)
Monocytes Absolute: 0.1 10*3/uL (ref 0.1–1.0)
Monocytes Relative: 11 %
Neutro Abs: 0.2 10*3/uL — CL (ref 1.7–7.7)
Neutrophils Relative %: 17 %
Platelets: 186 10*3/uL (ref 150–400)
RBC: 2.94 MIL/uL — ABNORMAL LOW (ref 3.87–5.11)
RDW: 16.3 % — ABNORMAL HIGH (ref 11.5–15.5)
WBC: 1.3 10*3/uL — ABNORMAL LOW (ref 4.0–10.5)
nRBC: 0 % (ref 0.0–0.2)

## 2022-12-29 MED ORDER — APIXABAN 2.5 MG PO TABS: 2.5000 mg | ORAL_TABLET | Freq: Two times a day (BID) | ORAL | 3 refills | Status: DC

## 2022-12-29 MED ORDER — SODIUM CHLORIDE 0.9 % IV SOLN
12.0000 mg | Freq: Once | INTRAVENOUS | Status: AC
  Administered 2022-12-29: 12 mg via INTRAVENOUS
  Filled 2022-12-29: qty 1.2

## 2022-12-29 MED ORDER — DIPHENHYDRAMINE HCL 25 MG PO CAPS
50.0000 mg | ORAL_CAPSULE | Freq: Once | ORAL | Status: AC
  Administered 2022-12-29: 50 mg via ORAL
  Filled 2022-12-29: qty 2

## 2022-12-29 MED ORDER — ACETAMINOPHEN 325 MG PO TABS
650.0000 mg | ORAL_TABLET | Freq: Once | ORAL | Status: AC
  Administered 2022-12-29: 650 mg via ORAL
  Filled 2022-12-29: qty 2

## 2022-12-29 MED ORDER — SODIUM CHLORIDE 0.9 % IV SOLN
Freq: Once | INTRAVENOUS | Status: AC
  Filled 2022-12-29: qty 250

## 2022-12-29 MED ORDER — HEPARIN SOD (PORK) LOCK FLUSH 100 UNIT/ML IV SOLN
500.0000 [IU] | Freq: Once | INTRAVENOUS | Status: AC | PRN
  Administered 2022-12-29: 500 [IU]
  Filled 2022-12-29: qty 5

## 2022-12-29 MED ORDER — SODIUM CHLORIDE 0.9 % IV SOLN
375.0000 mg/m2 | Freq: Once | INTRAVENOUS | Status: AC
  Administered 2022-12-29: 700 mg via INTRAVENOUS
  Filled 2022-12-29: qty 50

## 2022-12-29 NOTE — Patient Outreach (Signed)
  Care Coordination   Follow Up Visit Note   12/29/2022 Name: Annyston Mcinnis Caprara MRN: [id number] DOB: Mar 10, 1946  Prentice Docker Maynez is a 77 y.o. year old female who sees [name], NP for primary care. I  spoke with daughter, [name]   What matters to the patients health and wellness today?  Daughter states Anabel Bene were recovered by [employer] . She states additional prefilled E-tanks were left. She reports patient still has her D-tank and refill tank.     Goals Addressed             This Visit's Progress    Patient / caregiver stated:  Management of health conditions       Interventions Today    Flowsheet Row Most Recent Value  Chronic Disease   Chronic disease during today's visit Chronic Obstructive Pulmonary Disease (COPD)  General Interventions   General Interventions Discussed/Reviewed General Interventions Reviewed  [Call to patients daughter to confirm empty E- tanks were picked up by [employer] [facility]]                    SDOH assessments and interventions completed:  No     Care Coordination Interventions:  Yes, provided   Follow up plan: Follow up call scheduled for as previously scheduled    Encounter Outcome:  Pt. Visit Completed   Quinn Plowman RN,BSN,CCM [facility] [phone] direct line

## 2022-12-29 NOTE — Patient Instructions (Signed)
[facility]  Discharge Instructions: Thank you for choosing [facility] to provide your oncology and hematology care.  If you have a lab appointment with the Cancer Center, please go directly to the Cancer Center and check in at the registration area.  Wear comfortable clothing and clothing appropriate for easy access to any Portacath or PICC line.   We strive to give you quality time with your provider. You may need to reschedule your appointment if you arrive late (15 or more minutes).  Arriving late affects you and other patients whose appointments are after yours.  Also, if you miss three or more appointments without notifying the office, you may be dismissed from the clinic at the provider's discretion.      For prescription refill requests, have your pharmacy contact our office and allow 72 hours for refills to be completed.     To help prevent nausea and vomiting after your treatment, we encourage you to take your nausea medication as directed.  BELOW ARE SYMPTOMS THAT SHOULD BE REPORTED IMMEDIATELY: *FEVER GREATER THAN 100.4 F (38 C) OR HIGHER *CHILLS OR SWEATING *NAUSEA AND VOMITING THAT IS NOT CONTROLLED WITH YOUR NAUSEA MEDICATION *UNUSUAL SHORTNESS OF BREATH *UNUSUAL BRUISING OR BLEEDING *URINARY PROBLEMS (pain or burning when urinating, or frequent urination) *BOWEL PROBLEMS (unusual diarrhea, constipation, pain near the anus) TENDERNESS IN MOUTH AND THROAT WITH OR WITHOUT PRESENCE OF ULCERS (sore throat, sores in mouth, or a toothache) UNUSUAL RASH, SWELLING OR PAIN  UNUSUAL VAGINAL DISCHARGE OR ITCHING   Items with * indicate a potential emergency and should be followed up as soon as possible or go to the Emergency Department if any problems should occur.  Please show the CHEMOTHERAPY ALERT CARD or IMMUNOTHERAPY ALERT CARD at check-in to the Emergency Department and triage nurse.  Should you have questions after your visit  or need to cancel or reschedule your appointment, please contact [facility]  [phone] and follow the prompts.  Office hours are 8:00 a.m. to 4:30 p.m. Monday - Friday. Please note that voicemails left after 4:00 p.m. may not be returned until the following business day.  We are closed weekends and major holidays. You have access to a nurse at all times for urgent questions. Please call the main number to the clinic [phone] and follow the prompts.  For any non-urgent questions, you may also contact your provider using MyChart. We now offer e-Visits for anyone 18 and older to request care online for non-urgent symptoms. For details visit [email or web address].   Also download the MyChart app! Go to the app store, search "MyChart", open the app, select [facility], and log in with your MyChart username and password.    

## 2022-12-29 NOTE — Progress Notes (Signed)
[facility]  Telephone:([phone] [phone]  Patient Care Team: [name], NP as PCP - General (Nurse Practitioner) [name], RN (Inactive) as Case Manager [name], RN as [place] Management   Name of the patient: Natasha [name]  [id number]  04/02/1946   Date of visit: 12/29/22  HPI: Patient is a 77 y.o. 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 Currently receiving rituxamab with the plan to add on lenalidomide.  Reason for Consult: Lenalidomide oral chemotherapy education.   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; [name]; 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[facility]  09/08/2018 Initial Diagnosis   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[facility]   Nodal marginal zone B-cell lymphoma ([facility]  07/28/2022 Initial Diagnosis   Marginal zone lymphoma ([facility]   08/01/2022 Cancer Staging   Staging form: Hodgkin and Non-Hodgkin Lymphoma, AJCC 8th Edition - Clinical stage from 08/01/2022: Stage IV (Marginal zone lymphoma) - Signed by RSindy Guadeloupe MD on 08/01/2022   08/17/2022 - 08/17/2022 Chemotherapy   Patient is on Treatment Plan :  NON-HODGKINS LYMPHOMA Rituximab D1 + Bendamustine D1,2 q28d x 6 cycles     11/17/2022 -  Chemotherapy   Patient is on Treatment Plan : NON-HODGKINS LYMPHOMA Rituximab q7d       ALLERGIES:  has No Known Allergies.  MEDICATIONS:  Current Outpatient Medications  Medication Sig Dispense Refill   Accu-Chek Softclix Lancets lancets 1 each 3 (three) times daily.     acyclovir (ZOVIRAX) 400 MG tablet Take 400 mg by mouth daily.     albuterol (VENTOLIN HFA) 108 (90 Base) MCG/ACT inhaler TAKE 2 PUFFS BY MOUTH EVERY 6 HOURS AS NEEDED FOR WHEEZE OR SHORTNESS OF BREATH 18 g 1   apixaban (ELIQUIS) 2.5 MG TABS tablet Take 1 tablet (2.5 mg total) by mouth 2 (two) times daily. 60 tablet 3   Blood Glucose Monitoring Suppl DEVI 1 each by Does not apply route in the morning, at noon, and at bedtime. May substitute to any manufacturer covered by patient's insurance. 1 each 0   clobetasol cream (TEMOVATE) 0AB-123456789% Apply 1 Application topically daily. 30 g 0   furosemide (LASIX) 20 MG tablet Take 1 tablet (20 mg total) by mouth daily. 90 tablet 3   Glucose Blood (BLOOD GLUCOSE TEST STRIPS) STRP 1 each by In Vitro route in the morning, at noon, and at bedtime. May substitute to any manufacturer covered by patient's insurance. 100 strip 0   guaiFENesin-dextromethorphan (ROBITUSSIN DM) 100-10 MG/5ML syrup Take 5 mLs by mouth every 4 (four) hours as needed for cough. 118 mL 0   insulin NPH-regular Human (70-30) 100 UNIT/ML injection Inject 4 Units  into the skin 2 (two) times daily with a meal. Use only for hyperglycemia, CBG+ 300. Start with 5 units, titrate up to max 10 unit per dose if need. 10 mL 0   ipratropium-albuterol (DUONEB) 0.5-2.5 (3) MG/3ML SOLN Take 3 mLs by nebulization every 6 (six) hours as needed (shortness of breath or wheezing). 360 mL 0   Lancet Device MISC 1 each by Does not apply route in the morning, at noon, and at bedtime. May substitute to any manufacturer covered by patient's insurance. 1 each 0    Lancets Misc. MISC 1 each by Does not apply route in the morning, at noon, and at bedtime. May substitute to any manufacturer covered by patient's insurance. 100 each 0   lenalidomide (REVLIMID) 5 MG capsule Take 1 capsule (5 mg total) by mouth daily. Take for 21 days, then hold for 7 days. Repeat every 28 days. 21 capsule 0   levothyroxine (SYNTHROID) 50 MCG tablet Take 1 tablet (50 mcg total) by mouth daily at 6 (six) AM. 30 tablet 0   loperamide (IMODIUM) 2 MG capsule Take 2 mg by mouth as needed for diarrhea or loose stools.     midodrine (PROAMATINE) 10 MG tablet Take 1 tablet (10 mg total) by mouth 3 (three) times daily with meals. 60 tablet 0   nystatin-triamcinolone ointment (MYCOLOG) Apply 1 Application topically 2 (two) times daily. 30 g 0   ondansetron (ZOFRAN) 8 MG tablet Take 8 mg by mouth every 8 (eight) hours as needed for nausea, vomiting or refractory nausea / vomiting. Take 1 tablet q6H for 4 days after chemotherapy.     pantoprazole (PROTONIX) 40 MG tablet Take 1 tablet (40 mg total) by mouth daily. 30 tablet 0   prochlorperazine (COMPAZINE) 10 MG tablet Take 1 tablet by mouth every 6 (six) hours as needed for nausea or vomiting. Take 1 tab q6H for 4 days after chemotherapy.     simethicone (MYLICON) 80 MG chewable tablet Chew 180 mg by mouth every 6 (six) hours as needed for flatulence. Take 1 capsule PO PRN gas relief.     Tenofovir Alafenamide Fumarate (VEMLIDY) 25 MG TABS Take 1 tablet (25 mg total) by mouth daily. 30 tablet 3   traMADol (ULTRAM) 50 MG tablet Take 1 tablet (50 mg total) by mouth every 6 (six) hours as needed for moderate pain or severe pain. 30 tablet 0   No current facility-administered medications for this visit.   Facility-Administered Medications Ordered in Other Visits  Medication Dose Route Frequency Provider Last Rate Last Admin   heparin lock flush 100 unit/mL  500 Units Intracatheter Once PRN Sindy Guadeloupe, MD        VITAL SIGNS: There were no  vitals taken for this visit. There were no vitals filed for this visit.  Estimated body mass index is 29.34 kg/m as calculated from the following:   Height as of an earlier encounter on 12/29/22: '5\' 5"'$  (1.651 m).   Weight as of an earlier encounter on 12/29/22: 80 kg (176 lb 4.8 oz).  LABS: CBC:    Component Value Date/Time   WBC 1.3 (L) 12/29/2022 0900   HGB 8.2 (L) 12/29/2022 0900   HGB 8.0 (L) 06/22/2022 0939   HCT 27.3 (L) 12/29/2022 0900   HCT 26.6 (L) 06/22/2022 0939   PLT 186 12/29/2022 0900   PLT 77 (LL) 06/22/2022 0939   MCV 92.9 12/29/2022 0900   MCV 93 06/22/2022 0939   NEUTROABS 0.2 (LL) 12/29/2022 0900  NEUTROABS 0.4 (LL) 06/22/2022 0939   LYMPHSABS 0.8 12/29/2022 0900   LYMPHSABS 3.1 06/22/2022 0939   MONOABS 0.1 12/29/2022 0900   EOSABS 0.1 12/29/2022 0900   EOSABS 0.1 06/22/2022 0939   BASOSABS 0.0 12/29/2022 0900   BASOSABS 0.0 06/22/2022 0939   Comprehensive Metabolic Panel:    Component Value Date/Time   NA 140 12/29/2022 0900   NA 143 06/22/2022 0939   K 4.5 12/29/2022 0900   CL 98 12/29/2022 0900   CO2 35 (H) 12/29/2022 0900   BUN 28 (H) 12/29/2022 0900   BUN 21 06/22/2022 0939   CREATININE 0.65 12/29/2022 0900   CREATININE 0.92 07/07/2018 0955   GLUCOSE 212 (H) 12/29/2022 0900   CALCIUM 9.4 12/29/2022 0900   AST 14 (L) 12/29/2022 0900   ALT 14 12/29/2022 0900   ALKPHOS 101 12/29/2022 0900   BILITOT 0.6 12/29/2022 0900   BILITOT 2.0 (H) 06/22/2022 0939   PROT 5.0 (L) 12/29/2022 0900   PROT 4.9 (L) 06/22/2022 0939   ALBUMIN 3.2 (L) 12/29/2022 0900   ALBUMIN 3.4 (L) 06/22/2022 0939     Present during today's visit: patient only  Start plan: Patient will start lenalidomide once she has it in hand   Patient Education I spoke with patient for overview of new oral chemotherapy medication: lenalidomide   Administration: Counseled patient on administration, dosing, side effects, monitoring, drug-food interactions, safe handling, storage, and  disposal. Patient will take 1 capsule (5 mg total) by mouth daily. Take for 21 days, then hold for 7 days. Repeat every 28 days.  Side Effects: Side effects include but not limited to: rash/itchy skin, N/V, fatigue, decreased wbc/hgb/plt, constipation or diarrhea.    Drug-drug Interactions (DDI): No current DDIs with lenalidomide  Adherence: After discussion with patient no patient barriers to medication adherence identified.  Reviewed with patient importance of keeping a medication schedule and plan for any missed doses.  Ms. Bubb voiced understanding and appreciation. All questions answered. Medication handout provided.  Provided patient with [facility] phone number. Patient knows to call the office with questions or concerns. Oral Chemotherapy Navigation Clinic will continue to follow.  Patient expressed understanding and was in agreement with this plan. She also understands that She can call clinic at any time with any questions, concerns, or complaints.   Medication Access Issues: [facility] is still processing her prescription  Follow-up plan: RTC in 2 weeks  Thank you for allowing me to participate in the care of this patient.   Time Total: 15 mins  Visit consisted of counseling and education on dealing with issues of symptom management in the setting of serious and potentially life-threatening illness.Greater than 50%  of this time was spent counseling and coordinating care related to the above assessment and plan.  Signed by: Darl Pikes, PharmD, BCPS, Salley Slaughter, CPP Hematology/Oncology Clinical Pharmacist Practitioner [place]/DB/AP [facility] [phone]  12/29/2022 3:26 PM

## 2022-12-29 NOTE — Progress Notes (Signed)
Hematology/Oncology Consult note [facility]  Telephone:([phone] [phone]  Patient Care Team: [name], NP as PCP - General (Nurse Practitioner) [name], RN (Inactive) as Case Manager [name], RN as [place] [facility]   Name of the patient: Natasha [name]  [id number]  1946-07-19   Date of visit: 12/29/22  Diagnosis-stage IV nodal marginal zone lymphoma  Chief complaint/ Reason for visit-on treatment assessment prior to cycle 2-day 1 of Rituxan  Heme/Onc history: Patient is a 77 year old female with a past medical history significant for type 2 diabetes COPD postherpetic neuralgia who has been referred for anemia.  Her most recent CBC from 06/22/2022 showed white count of 4.3, H&H of 8/26.6 with an MCV of 93 and a platelet count of 77.  Looking back at her prior CBCs her hemoglobin was normal at 13.3 until November 2020 and since June 2023 her hemoglobin has been fluctuating between 8.5-9.5.  Platelets were normal up until November 2020 but fluctuating between 70-80 since June 2023.  Her hemoglobin was normal at 13 in May 2021 as well and we do not have any labs between 2021 and 2023.  Patient states that she was diagnosed with a possible lymphoma a few years ago in [place] but was told that she does not require any treatment   Patient had a CT chest abdomen and pelvis with contrast which showed bulky bilateral axillary mediastinal and hilar adenopathy with the largest axillary node measuring 3.3 x 2.5 cm as compared to 1.7 cm back in 2020.  Severe splenomegaly of 21.9 cm.  Bulky intra-abdominal adenopathy.  Moderate right pleural effusion   Flow cytometry from peripheral blood that was done at [facility] showed clonal B cells which was CD5 negative CD10 negative CD103 negative and CD123 negative.  Differential diagnosis includes marginal zone lymphoma, lymphoplasmacytic lymphoma, DLBCL and atypical CLL.  Blood smear  shows a small to medium lymphoid cells with irregular nuclear contours containing moderately condensed chromatin.  Large abnormal lymphocytes are rare.  Taken together these morphological and immunophenotypic findings favor the diagnosis of marginal zone lymphoma.   Patient had pleural fluid tapped as well and cytology was again consistent with involvement with patient's known B-cell lymphoma consistent with peripheral blood flow cytometry pathology.   Plan was to offer Bendamustine Rituxan chemotherapy for 6 cycles.  However patient had repeated episodes of hospitalization for respiratory failure and worsening performance status.  Plan was therefore made to proceed with acalabrutinib instead.  She started taking that on 08/31/2022   No significant response to acalabrutinib after 3 months of therapy.  Also had a major GI bleed.  Plan is to switch her to Rituxan and Revlimid with weekly Rituxan for 4 cycles followed by Rituxan Revlimid starting cycle 2 q. 28 days      Interval history-patient is tolerating Rituxan well so far.  She has not had any significant nausea vomiting or diarrhea.  Appetite is stable.  She has chronic bilateral lower extremity edema which is overall stable.  ECOG PS- 2 Pain scale- 3 Opioid associated constipation- no  Review of systems- Review of Systems  Constitutional:  Positive for malaise/fatigue. Negative for chills, fever and weight loss.  HENT:  Negative for congestion, ear discharge and nosebleeds.   Eyes:  Negative for blurred vision.  Respiratory:  Negative for cough, hemoptysis, sputum production, shortness of breath and wheezing.   Cardiovascular:  Negative for chest pain, palpitations, orthopnea and claudication.  Gastrointestinal:  Negative for abdominal pain, blood in stool, constipation, diarrhea, heartburn, melena, nausea and vomiting.  Genitourinary:  Negative for dysuria, flank pain, frequency, hematuria and urgency.  Musculoskeletal:  Negative for  back pain, joint pain and myalgias.  Skin:  Negative for rash.  Neurological:  Negative for dizziness, tingling, focal weakness, seizures, weakness and headaches.  Endo/Heme/Allergies:  Does not bruise/bleed easily.  Psychiatric/Behavioral:  Negative for depression and suicidal ideas. The patient does not have insomnia.       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   CHF (congestive heart failure) (HCC)    COPD (chronic obstructive pulmonary disease) (HCC)    Diabetes mellitus without complication (HCC)    Hypotension    Pleural effusion    [facility] 885m,  2 weeks ago   Vaginal delivery    x 5     Past Surgical History:  Procedure Laterality Date   IR IMAGING GUIDED PORT INSERTION  08/16/2022   TONSILLECTOMY Bilateral    as a child    Social History   Socioeconomic History   Marital status: Widowed    Spouse name: Not on file   Number of children: 2   Years of education: Not on file   Highest education level: 9th grade  Occupational History   Occupation: unemployed  Tobacco Use   Smoking status: Former    Packs/day: 2.00    Years: 55.00    Total pack years: 110.00    Types: Cigarettes    Start date: 07/07/1961    Quit date: 08/02/2022    Years since quitting: 0.4   Smokeless tobacco: Never   Tobacco comments:    1/2 pack she states but family says 2 PPD  Vaping Use   Vaping Use: Never used  Substance and Sexual Activity   Alcohol use: No   Drug use: Never   Sexual activity: Not Currently    Partners: Male    Birth control/protection: Post-menopausal  Other Topics Concern   Not on file  Social History Narrative   08/14/20   [place]  Living: to be near daughter   Work: retired      FPhysiological scientistchildren - JIT consultant(nearby) and son JEvelena Peat[place]in [place]  8 grandchildren, & 2 great-grandchildren.      Enjoys: stays at home      Exercise: walking to her daughter's  store   Diet: eats fruit, air fried chicken      Safety   Seat belts: Yes    Guns: No   Safe in relationships: Yes    Social Determinants of Health   Financial Resource Strain: Medium Risk (08/24/2022)   Overall Financial Resource Strain (CARDIA)    Difficulty of Paying Living Expenses: Somewhat hard  Food Insecurity: No Food Insecurity (12/15/2022)   Hunger Vital Sign    Worried About Running Out of Food in the Last Year: Never true    Ran Out of Food in the Last Year: Never true  Transportation Needs: Unmet Transportation Needs (12/29/2022)   PRAPARE - Transportation    Lack of Transportation (Medical): Yes    Lack of Transportation (Non-Medical): Yes  Physical Activity: Inactive (08/24/2022)   Exercise Vital Sign    Days of Exercise per Week: 0 days    Minutes of Exercise per Session: 0 min  Stress: Stress Concern Present (08/24/2022)  [place] Questionnaire    Feeling of Stress : To some extent  Social Connections: Socially Isolated (08/24/2022)   Social Connection and Isolation Panel [NHANES]    Frequency of Communication with Friends and Family: More than three times a week    Frequency of Social Gatherings with Friends and Family: More than three times a week    Attends Religious Services: Never    Marine scientist or Organizations: No    Attends Archivist Meetings: Never    Marital Status: Widowed  Intimate Partner Violence: Not At Risk (12/15/2022)   Humiliation, Afraid, Rape, and Kick questionnaire    Fear of Current or Ex-Partner: No    Emotionally Abused: No    Physically Abused: No    Sexually Abused: No    Family History  Problem Relation Age of Onset   Cancer Mother    Breast cancer Mother    Cancer Father    Alzheimer's disease Father    Lung cancer Father        lung   Varicose Veins Brother    Heart attack Brother      Current Outpatient Medications:    Accu-Chek Softclix  Lancets lancets, 1 each 3 (three) times daily., Disp: , Rfl:    acyclovir (ZOVIRAX) 400 MG tablet, Take 400 mg by mouth daily., Disp: , Rfl:    albuterol (VENTOLIN HFA) 108 (90 Base) MCG/ACT inhaler, TAKE 2 PUFFS BY MOUTH EVERY 6 HOURS AS NEEDED FOR WHEEZE OR SHORTNESS OF BREATH, Disp: 18 g, Rfl: 1   apixaban (ELIQUIS) 2.5 MG TABS tablet, Take 1 tablet (2.5 mg total) by mouth 2 (two) times daily., Disp: 60 tablet, Rfl: 3   Blood Glucose Monitoring Suppl DEVI, 1 each by Does not apply route in the morning, at noon, and at bedtime. May substitute to any manufacturer covered by patient's insurance., Disp: 1 each, Rfl: 0   clobetasol cream (TEMOVATE) [id number] %, Apply 1 Application topically daily., Disp: 30 g, Rfl: 0   furosemide (LASIX) 20 MG tablet, Take 1 tablet (20 mg total) by mouth daily., Disp: 90 tablet, Rfl: 3   Glucose Blood (BLOOD GLUCOSE TEST STRIPS) STRP, 1 each by In Vitro route in the morning, at noon, and at bedtime. May substitute to any manufacturer covered by patient's insurance., Disp: 100 strip, Rfl: 0   guaiFENesin-dextromethorphan (ROBITUSSIN DM) 100-10 MG/5ML syrup, Take 5 mLs by mouth every 4 (four) hours as needed for cough., Disp: 118 mL, Rfl: 0   insulin NPH-regular Human (70-30) 100 UNIT/ML injection, Inject 4 Units into the skin 2 (two) times daily with a meal. Use only for hyperglycemia, CBG+ 300. Start with 5 units, titrate up to max 10 unit per dose if need., Disp: 10 mL, Rfl: 0   ipratropium-albuterol (DUONEB) 0.5-2.5 (3) MG/3ML SOLN, Take 3 mLs by nebulization every 6 (six) hours as needed (shortness of breath or wheezing)., Disp: 360 mL, Rfl: 0   Lancet Device MISC, 1 each by Does not apply route in the morning, at noon, and at bedtime. May substitute to any manufacturer covered by patient's insurance., Disp: 1 each, Rfl: 0   Lancets Misc. MISC, 1 each by Does not apply route in the morning, at noon, and at bedtime. May substitute to any manufacturer covered by patient's  insurance., Disp: 100 each, Rfl: 0   lenalidomide (REVLIMID) 5 MG capsule, Take 1 capsule (5 mg total) by mouth daily. Take for 21 days, then  hold for 7 days. Repeat every 28 days., Disp: 21 capsule, Rfl: 0   levothyroxine (SYNTHROID) 50 MCG tablet, Take 1 tablet (50 mcg total) by mouth daily at 6 (six) AM., Disp: 30 tablet, Rfl: 0   loperamide (IMODIUM) 2 MG capsule, Take 2 mg by mouth as needed for diarrhea or loose stools., Disp: , Rfl:    midodrine (PROAMATINE) 10 MG tablet, Take 1 tablet (10 mg total) by mouth 3 (three) times daily with meals., Disp: 60 tablet, Rfl: 0   nystatin-triamcinolone ointment (MYCOLOG), Apply 1 Application topically 2 (two) times daily., Disp: 30 g, Rfl: 0   ondansetron (ZOFRAN) 8 MG tablet, Take 8 mg by mouth every 8 (eight) hours as needed for nausea, vomiting or refractory nausea / vomiting. Take 1 tablet q6H for 4 days after chemotherapy., Disp: , Rfl:    prochlorperazine (COMPAZINE) 10 MG tablet, Take 1 tablet by mouth every 6 (six) hours as needed for nausea or vomiting. Take 1 tab q6H for 4 days after chemotherapy., Disp: , Rfl:    simethicone (MYLICON) 80 MG chewable tablet, Chew 180 mg by mouth every 6 (six) hours as needed for flatulence. Take 1 capsule PO PRN gas relief., Disp: , Rfl:    Tenofovir Alafenamide Fumarate (VEMLIDY) 25 MG TABS, Take 1 tablet (25 mg total) by mouth daily., Disp: 30 tablet, Rfl: 3   traMADol (ULTRAM) 50 MG tablet, Take 1 tablet (50 mg total) by mouth every 6 (six) hours as needed for moderate pain or severe pain., Disp: 30 tablet, Rfl: 0   pantoprazole (PROTONIX) 40 MG tablet, Take 1 tablet (40 mg total) by mouth daily., Disp: 30 tablet, Rfl: 0  Physical exam:  Vitals:   12/29/22 0917  BP: 121/71  Pulse: 82  Resp: 18  Temp: 97.6 F (36.4 C)  TempSrc: Tympanic  SpO2: 100%  Weight: 176 lb 4.8 oz (80 kg)  Height: '5\' 5"'$  (1.651 m)   Physical Exam Cardiovascular:     Rate and Rhythm: Normal rate and regular rhythm.     Heart  sounds: Normal heart sounds.  Pulmonary:     Effort: Pulmonary effort is normal.     Breath sounds: Normal breath sounds.  Abdominal:     General: Bowel sounds are normal.     Palpations: Abdomen is soft.     Comments: Palpable splenomegaly unchanged  Lymphadenopathy:     Comments: Right axillary adenopathy appears to be improving.  No palpable left axillary adenopathy.  Skin:    General: Skin is warm and dry.  Neurological:     Mental Status: She is alert and oriented to person, place, and time.         Latest Ref Rng & Units 12/29/2022    9:00 AM  CMP  Glucose 70 - 99 mg/dL 212   BUN 8 - 23 mg/dL 28   Creatinine 0.44 - 1.00 mg/dL 0.65   Sodium 135 - 145 mmol/L 140   Potassium 3.5 - 5.1 mmol/L 4.5   Chloride 98 - 111 mmol/L 98   CO2 22 - 32 mmol/L 35   Calcium 8.9 - 10.3 mg/dL 9.4   Total Protein 6.5 - 8.1 g/dL 5.0   Total Bilirubin 0.3 - 1.2 mg/dL 0.6   Alkaline Phos 38 - 126 U/L 101   AST 15 - 41 U/L 14   ALT 0 - 44 U/L 14       Latest Ref Rng & Units 12/29/2022    9:00 AM  CBC  WBC 4.0 - 10.5 K/uL 1.3   Hemoglobin 12.0 - 15.0 g/dL 8.2   Hematocrit 36.0 - 46.0 % 27.3   Platelets 150 - 400 K/uL 186     No images are attached to the encounter.  DG Chest 2 View  Result Date: 12/15/2022 CLINICAL DATA:  Shortness of breath, increased EXAM: CHEST - 2 VIEW COMPARISON:  12/07/2022 FINDINGS: RIGHT jugular Port-A-Cath with tip projecting over cavoatrial junction. Enlargement of cardiac silhouette with pulmonary vascular congestion. Diffuse infiltrates consistent with pulmonary edema. Interval increase in BILATERAL pleural effusions and bibasilar atelectasis. No pneumothorax or acute osseous findings. IMPRESSION: Increased pulmonary edema and bibasilar pleural effusions/atelectasis. Electronically Signed   By: Lavonia Dana M.D.   On: 12/15/2022 16:27   DG Tibia/Fibula Left  Result Date: 12/14/2022 CLINICAL DATA:  Leg pain swelling EXAM: LEFT TIBIA AND FIBULA - 2 VIEW  COMPARISON:  None Available. FINDINGS: No fracture or malalignment. No soft tissue emphysema. No periostitis or osseous destructive change. 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 Diffuse soft tissue swelling IMPRESSION: No acute osseous abnormality. 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 Electronically Signed   By: Donavan Foil M.D.   On: 12/14/2022 18:34   US Venous Img Lower Unilateral Left  Result Date: 12/14/2022 CLINICAL DATA:  left leg pain EXAM: LEFT LOWER EXTREMITY VENOUS DOPPLER ULTRASOUND TECHNIQUE: Gray-scale sonography with compression, as well as color and duplex ultrasound, were performed to evaluate the deep venous system(s) from the level of the common femoral vein through the popliteal and proximal calf veins. COMPARISON:  None Available. FINDINGS: VENOUS Normal compressibility of the common femoral, superficial femoral, and popliteal veins, as well as the visualized calf veins. Visualized portions of profunda femoral vein and great saphenous vein unremarkable. No filling defects to suggest DVT on grayscale or color Doppler imaging. Doppler waveforms show normal direction of venous flow, normal respiratory plasticity and response to augmentation. Limited views of the contralateral common femoral vein are unremarkable. OTHER Multiple lymph nodes in the groins noted. The largest measures approximately 4.2 x 1.3 cm in the left groin. IMPRESSION: No evidence of DVT in the left lower extremity. Electronically Signed   By: Margaretha Sheffield M.D.   On: 12/14/2022 18:22   US THORACENTESIS ASP PLEURAL SPACE W/IMG GUIDE  Result Date: 12/07/2022 INDICATION: History of gastric lymphoma with recurrent pleural effusions. Request received for diagnostic and therapeutic thoracentesis. Upon ultrasound exam right larger than left. EXAM: ULTRASOUND GUIDED DIAGNOSTIC AND THERAPEUTIC RIGHT THORACENTESIS MEDICATIONS: 10 mL 1 % lidocaine COMPLICATIONS: None immediate. PROCEDURE: An ultrasound guided thoracentesis was thoroughly  discussed with the patient and questions answered. The benefits, risks, alternatives and complications were also discussed. The patient understands and wishes to proceed with the procedure. Written consent was obtained. Ultrasound was performed to localize and mark an adequate pocket of fluid in the right chest. The area was then prepped and draped in the normal sterile fashion. 1% Lidocaine was used for local anesthesia. Under ultrasound guidance a 6 Fr Safe-T-Centesis catheter was introduced. Thoracentesis was performed. The catheter was removed and a dressing applied. FINDINGS: A total of approximately 1.2 L of clear, amber fluid was removed. Samples were sent to the laboratory as requested by the clinical team. IMPRESSION: Successful ultrasound guided right thoracentesis yielding 1.2 L of pleural fluid. Read by: Narda Rutherford, AGNP-BC Electronically Signed   By: Albin Felling M.D.   On: 12/07/2022 16:19   DG Chest Port 1 View  Result Date: 12/07/2022 CLINICAL DATA:  Status post right thoracentesis. EXAM: PORTABLE CHEST  1 VIEW COMPARISON:  12/01/2022 and chest CT 11/07/2022 FINDINGS: Improved aeration in the right lower lung compatible with recent thoracentesis. Residual densities at the right lung base compatible with areas of volume loss and pleural fluid. There is a nodular density in the mid right lung which corresponds with the lesion seen on the previous CT. This nodule lesion measures roughly 2.2 cm. Right jugular Port-A-Cath with the tip near the superior cavoatrial junction. Persistent left basilar densities compatible with pleural fluid and compressive atelectasis. Patchy interstitial densities in both lungs are similar to the previous chest CT. Negative for a pneumothorax. IMPRESSION: 1. Improved aeration in the right lower lung compatible with recent thoracentesis. Negative for a pneumothorax. 2. Residual densities at the lung bases compatible with pleural effusions and compressive  atelectasis/consolidation. 3. Nodular density in the mid right lung corresponds with the lesion seen on the previous CT. 4. Persistent patchy interstitial densities in both lungs. Electronically Signed   By: Markus Daft M.D.   On: 12/07/2022 16:04   DG Chest 2 View  Result Date: 12/03/2022 CLINICAL DATA:  Pleural effusions.  Assess for amount of fluid. EXAM: CHEST - 2 VIEW COMPARISON:  11/10/2022.  CT, 11/07/2022. FINDINGS: Moderate left and moderate to large right pleural effusions, right increased from the prior exam, left likely also increased, but to a lesser degree. Central vascular congestion. Hazy perihilar opacity and more confluent lung base opacity. Lung apices are essentially clear. No pneumothorax. Cardiac silhouette is obscured. No gross mediastinal mass. No convincing hilar mass. Right anterior chest wall Port-A-Cath is stable. Skeletal structures are grossly intact. IMPRESSION: 1. Mild interval increase in the size of the right pleural effusion, now moderate to large. Moderate left pleural effusion suspected to be slightly increased from the recent prior study. 2. Vascular congestion. Central hazy perihilar airspace opacities and more confluent lung base opacities. Lung opacities may reflect atelectasis, edema or a combination, similar in appearance to the prior exam. Electronically Signed   By: Lajean Manes M.D.   On: 12/03/2022 15:58     Assessment and plan- Patient is a 77 y.o. female with history of stage IV nodal marginal zone lymphoma.  She is here for on treatment assessment prior to cycle 2-day 1 of Rituxan  Patient has received 4 weekly doses of Rituxan so far and tolerated it well.  She will now proceed with cycle 2-day 1 of Rituxan today and moving forward she will receive Rituxan every 4 weeks.  I will plan to start Revlimid 5 mg along with this cycle 3 weeks on and 1 week off.  Patient has baseline severe neutropenia and anemia secondary to her nodal marginal zone lymphoma which  has been proven on bone marrow biopsy.  Thrombocytopenia has resolved.  It is going to be difficult to gauge her counts and toxicity from Revlimid with her existing neutropenia.  I am therefore starting on a low-dose Revlimid at 5 mg.  She is also at high risk of DVT given her relative immobility bilateral lower extremity lymphedema and stasis dermatitis.  I am therefore starting her on Eliquis 2.5 mg twice daily for DVT prophylaxis instead of low-dose aspirin.    She will be seen in 2 weeks's time by covering NP with CBC with differential and BMP for possible blood transfusion and I will see her back in 4 weeks for cycle 3-day 1 of Rituxan   Visit Diagnosis 1. Nodal marginal zone B-cell lymphoma (HCC)   2. Other pancytopenia ([place])   3.  High risk medication use   4. Encounter for monitoring rituximab therapy      Dr. Randa Evens, MD, MPH [facility] at [facility] [id number] 12/29/2022 12:43 PM

## 2022-12-29 NOTE — Progress Notes (Signed)
Proceed today with full dose Rituximab (not split dose) per MD

## 2022-12-30 ENCOUNTER — Telehealth: Payer: Self-pay

## 2022-12-30 ENCOUNTER — Other Ambulatory Visit (HOSPITAL_COMMUNITY): Payer: Self-pay

## 2022-12-30 NOTE — Telephone Encounter (Addendum)
Received notification from [facility] that they needed new insurance information in order to process patient's Revlimid. Called and verified that [employer] had the same billing information we did since we were getting a paid claim when test billing. [employer] did not have the correct group number. Once updated they received a paid claim as well.  Representative for [employer] informed me they sent a message to their nursing staff to reach out and schedule shipment ASAP to get patient their medication. I also called the patient's daughter and left her a VM letting her know insurance was taken care of and to call me back at [phone] with any further questions.   Berdine Addison, [facility] Patient [place]  [phone] (phone) [phone] (fax) 12/30/2022 10:08 AM

## 2022-12-31 ENCOUNTER — Telehealth: Payer: Self-pay

## 2022-12-31 ENCOUNTER — Ambulatory Visit: Payer: Medicaid Other | Admitting: Nurse Practitioner

## 2022-12-31 DIAGNOSIS — Z9981 Dependence on supplemental oxygen: Secondary | ICD-10-CM | POA: Diagnosis not present

## 2022-12-31 DIAGNOSIS — D709 Neutropenia, unspecified: Secondary | ICD-10-CM | POA: Diagnosis not present

## 2022-12-31 DIAGNOSIS — Z79899 Other long term (current) drug therapy: Secondary | ICD-10-CM | POA: Diagnosis not present

## 2022-12-31 DIAGNOSIS — E119 Type 2 diabetes mellitus without complications: Secondary | ICD-10-CM | POA: Diagnosis not present

## 2022-12-31 DIAGNOSIS — J449 Chronic obstructive pulmonary disease, unspecified: Secondary | ICD-10-CM | POA: Diagnosis not present

## 2022-12-31 DIAGNOSIS — L03116 Cellulitis of left lower limb: Secondary | ICD-10-CM | POA: Diagnosis not present

## 2022-12-31 DIAGNOSIS — E039 Hypothyroidism, unspecified: Secondary | ICD-10-CM | POA: Diagnosis not present

## 2022-12-31 DIAGNOSIS — Z79891 Long term (current) use of opiate analgesic: Secondary | ICD-10-CM | POA: Diagnosis not present

## 2022-12-31 DIAGNOSIS — I509 Heart failure, unspecified: Secondary | ICD-10-CM | POA: Diagnosis not present

## 2022-12-31 DIAGNOSIS — D630 Anemia in neoplastic disease: Secondary | ICD-10-CM | POA: Diagnosis not present

## 2022-12-31 DIAGNOSIS — Z794 Long term (current) use of insulin: Secondary | ICD-10-CM | POA: Diagnosis not present

## 2022-12-31 DIAGNOSIS — B0229 Other postherpetic nervous system involvement: Secondary | ICD-10-CM | POA: Diagnosis not present

## 2022-12-31 DIAGNOSIS — C884 Extranodal marginal zone B-cell lymphoma of mucosa-associated lymphoid tissue [MALT-lymphoma]: Secondary | ICD-10-CM | POA: Diagnosis not present

## 2022-12-31 NOTE — Telephone Encounter (Signed)
Called Patient's daughter to let them know she missed her hospital follow up appointment again due to the home phone being disconnected. Natasha [name] the daughter states that her daughter left with her mother ( the Patient) at 1:20 in a medicaid transportation vehicle. Natasha [name] states she will call her daughter to see what is going on and that I can call back in about 5 minutes to find out where she is.  I went to get another Patient and check in came back and stated the Patient was here one hour late and the caregiver was going to leave her. The care giver stated they were at PT and could not leave. Patient is getting rescheduled due to [place] being booked the rest of the day.

## 2022-12-31 NOTE — Telephone Encounter (Signed)
Called [employer] to confirm whether or not they were able to get in contact with patient to schedule delivery. Informed they have reached out and have not heard back yet. I called and left a VM for patient's daughter, [name], at [phone] to let them know [employer] was trying to reach out to schedule delivery and also let them know they could call [employer] and schedule the delivery.    Berdine Addison, [facility] Patient [place]  [phone] (phone) [phone] (fax) 12/31/2022 8:27 AM

## 2023-01-04 ENCOUNTER — Telehealth: Payer: Self-pay | Admitting: Nurse Practitioner

## 2023-01-04 DIAGNOSIS — E119 Type 2 diabetes mellitus without complications: Secondary | ICD-10-CM | POA: Diagnosis not present

## 2023-01-04 DIAGNOSIS — Z79891 Long term (current) use of opiate analgesic: Secondary | ICD-10-CM | POA: Diagnosis not present

## 2023-01-04 DIAGNOSIS — D709 Neutropenia, unspecified: Secondary | ICD-10-CM | POA: Diagnosis not present

## 2023-01-04 DIAGNOSIS — Z794 Long term (current) use of insulin: Secondary | ICD-10-CM | POA: Diagnosis not present

## 2023-01-04 DIAGNOSIS — Z79899 Other long term (current) drug therapy: Secondary | ICD-10-CM | POA: Diagnosis not present

## 2023-01-04 DIAGNOSIS — E039 Hypothyroidism, unspecified: Secondary | ICD-10-CM | POA: Diagnosis not present

## 2023-01-04 DIAGNOSIS — I509 Heart failure, unspecified: Secondary | ICD-10-CM | POA: Diagnosis not present

## 2023-01-04 DIAGNOSIS — C884 Extranodal marginal zone B-cell lymphoma of mucosa-associated lymphoid tissue [MALT-lymphoma]: Secondary | ICD-10-CM | POA: Diagnosis not present

## 2023-01-04 DIAGNOSIS — B0229 Other postherpetic nervous system involvement: Secondary | ICD-10-CM | POA: Diagnosis not present

## 2023-01-04 DIAGNOSIS — Z9981 Dependence on supplemental oxygen: Secondary | ICD-10-CM | POA: Diagnosis not present

## 2023-01-04 DIAGNOSIS — D630 Anemia in neoplastic disease: Secondary | ICD-10-CM | POA: Diagnosis not present

## 2023-01-04 DIAGNOSIS — L03116 Cellulitis of left lower limb: Secondary | ICD-10-CM | POA: Diagnosis not present

## 2023-01-04 DIAGNOSIS — J449 Chronic obstructive pulmonary disease, unspecified: Secondary | ICD-10-CM | POA: Diagnosis not present

## 2023-01-04 LAB — FUNGUS CULTURE WITH STAIN

## 2023-01-04 LAB — FUNGUS CULTURE RESULT

## 2023-01-04 LAB — FUNGAL ORGANISM REFLEX

## 2023-01-04 NOTE — Telephone Encounter (Signed)
dania from [facility] called stating pt need a refill on her insulin. Pt sugar was 300 on Saturday and pt is having a hard time pushing her inhalers down

## 2023-01-04 NOTE — Telephone Encounter (Signed)
I spoke with Dania with [facility] and informed her that pt has still not been seen by [facility]. Therefore we are unable to assist with her medications at this time.  I encouraged her to reach back out to the daughter & have her call our office to schedule her an appt & to make sure that she get here this time so that we can assist with her care.

## 2023-01-04 NOTE — Telephone Encounter (Signed)
Patient's daughter, [name], returned my call. She informed me she called to order medication and was told she could not since they did not have her as an authorized contact. Informed her to call back while with patient and the patient could provide verbal consent for her to manage refills and orders. Patient expressed understanding and knows to call me back at [phone] with any questions or concerns.    Berdine Addison, [facility] Patient [place]  [phone] (phone) [phone] (fax) 01/04/2023 9:38 AM

## 2023-01-05 ENCOUNTER — Ambulatory Visit (INDEPENDENT_AMBULATORY_CARE_PROVIDER_SITE_OTHER): Payer: 59 | Admitting: Nurse Practitioner

## 2023-01-05 ENCOUNTER — Telehealth: Payer: Self-pay | Admitting: Nurse Practitioner

## 2023-01-05 ENCOUNTER — Encounter: Payer: Self-pay | Admitting: Nurse Practitioner

## 2023-01-05 VITALS — BP 122/60 | HR 84 | Temp 98.5°F | Ht 65.0 in | Wt 174.4 lb

## 2023-01-05 DIAGNOSIS — E039 Hypothyroidism, unspecified: Secondary | ICD-10-CM

## 2023-01-05 DIAGNOSIS — E1122 Type 2 diabetes mellitus with diabetic chronic kidney disease: Secondary | ICD-10-CM | POA: Diagnosis not present

## 2023-01-05 DIAGNOSIS — Z794 Long term (current) use of insulin: Secondary | ICD-10-CM

## 2023-01-05 DIAGNOSIS — C8300 Small cell B-cell lymphoma, unspecified site: Secondary | ICD-10-CM

## 2023-01-05 DIAGNOSIS — J449 Chronic obstructive pulmonary disease, unspecified: Secondary | ICD-10-CM

## 2023-01-05 NOTE — Progress Notes (Unsigned)
Tomasita Morrow, NP-C Phone: [phone]  Natasha [name] is a 77 y.o. female who presents today to establish care.   DIABETES Disease Monitoring: Blood Sugar ranges-*** Polyuria/phagia/dipsia- ***      Optho- *** Medications: Compliance- *** Hypoglycemic symptoms- *** Lab Results  Component Value Date   HGBA1C 5.6 10/21/2022   HYPOTHYROIDISM Disease Monitoring Weight changes: ***  Skin Changes: *** Palpitations: *** Heat/Cold intolerance: *** Medication Monitoring Compliance:  ***   Last TSH:   Lab Results  Component Value Date   TSH 6.064 (H) 07/28/2022   COPD: Medication compliance- ***  Rescue inhaler use- *** Dyspnea- ***  Wheezing- ***  Cough- ***  Productive- ***   Active Ambulatory Problems    Diagnosis Date Noted   Primary osteoarthritis of both knees 07/07/2018   Senile purpura ([place]) 07/07/2018   Generalized lymphadenopathy 09/08/2018   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   Post herpetic neuralgia 03/14/2020   Gall stones 10/23/2019   Lymphoma ([place]) 10/23/2019   Type 2 diabetes mellitus with proteinuria ([place]) 04/09/2022   Atherosclerosis of aorta ([place]) 04/09/2022   Chronic obstructive pulmonary disease (COPD) ([place]) 04/09/2022   SOB (shortness of breath) on exertion 04/09/2022   Acute cough 04/09/2022   Other pancytopenia ([place]) 04/16/2022   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/12/2022   Lower extremity edema 05/12/2022   Bilateral pleural effusion 07/12/2022   Diabetes mellitus, type II ([place]) 07/12/2022   Acute on chronic respiratory failure with hypoxia and hypercarbia (HCC) 07/12/2022   COVID-19 virus infection 07/14/2022   Anemia of chronic disease 07/14/2022   Thrombocytopenia ([place]) 07/14/2022   Hypothyroidism, unspecified 07/14/2022   Chronic kidney disease, stage 3a ([place]) 07/14/2022   Palliative care encounter    Swelling of lower extremity 07/24/2022   Hypotension 07/24/2022   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    Chronic respiratory failure with hypoxia ([place]) 08/16/2022   COPD with acute exacerbation ([place]) 08/29/2022   Type 2 diabetes mellitus without complication, with long-term current use of insulin ([place]) 08/29/2022   Normocytic anemia 08/29/2022   Severe sepsis ([place]) 08/29/2022   Iron deficiency anemia 09/19/2022   Influenza A with pneumonia 10/20/2022   Neutropenia ([place]) 10/20/2022   Myocardial injury 10/20/2022   Type II diabetes mellitus with renal manifestations ([place]) 10/20/2022   Overweight (BMI 25.0-29.9) 10/21/2022   AKI (acute kidney injury) ([place]) 10/21/2022   Anemia associated with chemotherapy 10/21/2022   Hemorrhagic shock ([place]) 11/07/2022   Acute blood loss anemia 11/11/2022   Acute GI bleeding 11/12/2022   Left leg cellulitis 12/14/2022   Pancytopenia ([place]) 12/17/2022   Resolved Ambulatory Problems    Diagnosis Date Noted   Controlled type 2 diabetes mellitus without complication, without long-term current use of insulin ([place]) 07/07/2018   Leg cramping 07/07/2018   SI (sacroiliac) joint inflammation ([place]) 08/14/2018   Nail abnormality 08/14/2020   Cigarette nicotine dependence, uncomplicated [id number]   Lung cancer screening declined by patient 08/14/2020   Acute on chronic diastolic CHF (congestive heart failure) ([place]) 07/14/2022   Acute on chronic anemia 08/02/2022   Acute on chronic congestive heart failure ([place]) 08/02/2022   Chronic diastolic CHF (congestive heart failure) ([place]) 10/20/2022   Past Medical History:  Diagnosis Date   Anemia    Cancer ([place])    Cancer (HCC)    CHF (congestive heart failure) (HCC)    COPD (chronic obstructive pulmonary disease) ([place])    Diabetes mellitus without complication ([place])    Pleural effusion    Vaginal  delivery     Family History  Problem Relation Age of Onset   Cancer Mother    Breast cancer Mother    Cancer Father    Alzheimer's disease Father    Lung cancer Father        lung   Varicose Veins Brother    Heart  attack Brother     Social History   Socioeconomic History   Marital status: Widowed    Spouse name: Not on file   Number of children: 2   Years of education: Not on file   Highest education level: 9th grade  Occupational History   Occupation: unemployed  Tobacco Use   Smoking status: Former    Packs/day: 2.00    Years: 55.00    Total pack years: 110.00    Types: Cigarettes    Start date: 07/07/1961    Quit date: 08/02/2022    Years since quitting: 0.4   Smokeless tobacco: Never   Tobacco comments:    1/2 pack she states but family says 2 PPD  Vaping Use   Vaping Use: Never used  Substance and Sexual Activity   Alcohol use: No   Drug use: Never   Sexual activity: Not Currently    Partners: Male    Birth control/protection: Post-menopausal  Other Topics Concern   Not on file  Social History Narrative   08/14/20   From: [place]   Living: to be near daughter   Work: retired      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      Enjoys: stays at home      Exercise: walking to her daughter's store   Diet: eats fruit, air fried chicken      Safety   Seat belts: Yes    Guns: No   Safe in relationships: Yes    Social Determinants of Health   Financial Resource Strain: Medium Risk (08/24/2022)   Overall Financial Resource Strain (CARDIA)    Difficulty of Paying Living Expenses: Somewhat hard  Food Insecurity: No Food Insecurity (12/15/2022)   Hunger Vital Sign    Worried About Running Out of Food in the Last Year: Never true    Ran Out of Food in the Last Year: Never true  Transportation Needs: Unmet Transportation Needs (12/29/2022)   PRAPARE - Transportation    Lack of Transportation (Medical): Yes    Lack of Transportation (Non-Medical): Yes  Physical Activity: Inactive (08/24/2022)   Exercise Vital Sign    Days of Exercise per Week: 0 days    Minutes of Exercise per Session: 0 min  Stress: Stress  Concern Present (08/24/2022)   [place]    Feeling of Stress : To some extent  Social Connections: Socially Isolated (08/24/2022)   Social Connection and Isolation Panel [NHANES]    Frequency of Communication with Friends and Family: More than three times a week    Frequency of Social Gatherings with Friends and Family: More than three times a week    Attends Religious Services: Never    Marine scientist or Organizations: No    Attends Archivist Meetings: Never    Marital Status: Widowed  Intimate Partner Violence: Not At Risk (12/15/2022)   Humiliation, Afraid, Rape, and Kick questionnaire    Fear of Current or Ex-Partner: No    Emotionally Abused: No    Physically Abused: No  Sexually Abused: No    ROS  General:  Negative for nexplained weight loss, fever Skin: Negative for new or changing mole, sore that won't heal HEENT: Negative for trouble hearing, trouble seeing, ringing in ears, mouth sores, hoarseness, change in voice, dysphagia. CV:  Negative for chest pain, dyspnea, edema, palpitations Resp: Negative for cough, dyspnea, hemoptysis GI: Negative for nausea, vomiting, diarrhea, constipation, abdominal pain, melena, hematochezia. GU: Negative for dysuria, incontinence, urinary hesitance, hematuria, vaginal or penile discharge, polyuria, sexual difficulty, lumps in testicle or breasts MSK: Negative for muscle cramps or aches, joint pain or swelling Neuro: Negative for headaches, weakness, numbness, dizziness, passing out/fainting Psych: Negative for depression, anxiety, memory problems  Objective  Physical Exam There were no vitals filed for this visit.  BP Readings from Last 3 Encounters:  12/29/22 129/65  12/29/22 121/71  12/23/22 118/63   Wt Readings from Last 3 Encounters:  12/29/22 176 lb 4.8 oz (80 kg)  12/22/22 176 lb (79.8 kg)  12/15/22 169 lb 12.1 oz (77 kg)    Physical  Exam   Assessment/Plan:   There are no diagnoses linked to this encounter.  No follow-ups on file.   Tomasita Morrow, NP-C [employer]

## 2023-01-05 NOTE — Telephone Encounter (Signed)
Per [facility] tracking, Revlimid is going to be delivered today 01/05/23

## 2023-01-05 NOTE — Telephone Encounter (Signed)
[facility] called in staying that they had faxed [place] some orders to be sign by provider. However, provider forgot to sign the rag order, therefore, they will fax that form again so provider can sign back and return.

## 2023-01-06 ENCOUNTER — Other Ambulatory Visit: Payer: Self-pay

## 2023-01-06 ENCOUNTER — Inpatient Hospital Stay
Admission: EM | Admit: 2023-01-06 | Discharge: 2023-01-14 | DRG: 485 | Disposition: A | Discharge status: Home Health | Payer: 59 | Attending: Internal Medicine | Admitting: Internal Medicine

## 2023-01-06 ENCOUNTER — Encounter: Payer: Self-pay | Admitting: Emergency Medicine

## 2023-01-06 ENCOUNTER — Emergency Department: Payer: 59

## 2023-01-06 DIAGNOSIS — R109 Unspecified abdominal pain: Secondary | ICD-10-CM | POA: Diagnosis not present

## 2023-01-06 DIAGNOSIS — M00862 Arthritis due to other bacteria, left knee: Secondary | ICD-10-CM | POA: Diagnosis not present

## 2023-01-06 DIAGNOSIS — R339 Retention of urine, unspecified: Secondary | ICD-10-CM | POA: Diagnosis not present

## 2023-01-06 DIAGNOSIS — Z515 Encounter for palliative care: Secondary | ICD-10-CM

## 2023-01-06 DIAGNOSIS — C884 Extranodal marginal zone B-cell lymphoma of mucosa-associated lymphoid tissue [MALT-lymphoma]: Secondary | ICD-10-CM | POA: Diagnosis present

## 2023-01-06 DIAGNOSIS — Z7901 Long term (current) use of anticoagulants: Secondary | ICD-10-CM

## 2023-01-06 DIAGNOSIS — J9611 Chronic respiratory failure with hypoxia: Secondary | ICD-10-CM | POA: Diagnosis present

## 2023-01-06 DIAGNOSIS — Z79890 Hormone replacement therapy (postmenopausal): Secondary | ICD-10-CM | POA: Diagnosis not present

## 2023-01-06 DIAGNOSIS — L03116 Cellulitis of left lower limb: Secondary | ICD-10-CM | POA: Diagnosis not present

## 2023-01-06 DIAGNOSIS — Z66 Do not resuscitate: Secondary | ICD-10-CM | POA: Diagnosis present

## 2023-01-06 DIAGNOSIS — M2550 Pain in unspecified joint: Secondary | ICD-10-CM | POA: Diagnosis not present

## 2023-01-06 DIAGNOSIS — N1831 Chronic kidney disease, stage 3a: Secondary | ICD-10-CM | POA: Diagnosis not present

## 2023-01-06 DIAGNOSIS — B0229 Other postherpetic nervous system involvement: Secondary | ICD-10-CM | POA: Diagnosis not present

## 2023-01-06 DIAGNOSIS — Z801 Family history of malignant neoplasm of trachea, bronchus and lung: Secondary | ICD-10-CM

## 2023-01-06 DIAGNOSIS — R627 Adult failure to thrive: Secondary | ICD-10-CM | POA: Diagnosis present

## 2023-01-06 DIAGNOSIS — Z9981 Dependence on supplemental oxygen: Secondary | ICD-10-CM | POA: Diagnosis not present

## 2023-01-06 DIAGNOSIS — Z79891 Long term (current) use of opiate analgesic: Secondary | ICD-10-CM | POA: Diagnosis not present

## 2023-01-06 DIAGNOSIS — C9590 Leukemia, unspecified not having achieved remission: Secondary | ICD-10-CM | POA: Diagnosis present

## 2023-01-06 DIAGNOSIS — R0602 Shortness of breath: Secondary | ICD-10-CM | POA: Diagnosis not present

## 2023-01-06 DIAGNOSIS — R188 Other ascites: Secondary | ICD-10-CM | POA: Diagnosis not present

## 2023-01-06 DIAGNOSIS — Z5982 Transportation insecurity: Secondary | ICD-10-CM

## 2023-01-06 DIAGNOSIS — J449 Chronic obstructive pulmonary disease, unspecified: Secondary | ICD-10-CM | POA: Diagnosis present

## 2023-01-06 DIAGNOSIS — Z85028 Personal history of other malignant neoplasm of stomach: Secondary | ICD-10-CM

## 2023-01-06 DIAGNOSIS — M009 Pyogenic arthritis, unspecified: Secondary | ICD-10-CM | POA: Diagnosis not present

## 2023-01-06 DIAGNOSIS — M109 Gout, unspecified: Secondary | ICD-10-CM | POA: Diagnosis present

## 2023-01-06 DIAGNOSIS — E46 Unspecified protein-calorie malnutrition: Secondary | ICD-10-CM | POA: Diagnosis not present

## 2023-01-06 DIAGNOSIS — D61818 Other pancytopenia: Secondary | ICD-10-CM | POA: Diagnosis present

## 2023-01-06 DIAGNOSIS — R5081 Fever presenting with conditions classified elsewhere: Secondary | ICD-10-CM | POA: Diagnosis present

## 2023-01-06 DIAGNOSIS — M170 Bilateral primary osteoarthritis of knee: Secondary | ICD-10-CM | POA: Diagnosis not present

## 2023-01-06 DIAGNOSIS — N390 Urinary tract infection, site not specified: Secondary | ICD-10-CM | POA: Diagnosis not present

## 2023-01-06 DIAGNOSIS — Z794 Long term (current) use of insulin: Secondary | ICD-10-CM

## 2023-01-06 DIAGNOSIS — D696 Thrombocytopenia, unspecified: Secondary | ICD-10-CM | POA: Diagnosis not present

## 2023-01-06 DIAGNOSIS — D649 Anemia, unspecified: Secondary | ICD-10-CM | POA: Diagnosis not present

## 2023-01-06 DIAGNOSIS — R197 Diarrhea, unspecified: Secondary | ICD-10-CM | POA: Diagnosis not present

## 2023-01-06 DIAGNOSIS — D638 Anemia in other chronic diseases classified elsewhere: Secondary | ICD-10-CM | POA: Diagnosis present

## 2023-01-06 DIAGNOSIS — J90 Pleural effusion, not elsewhere classified: Secondary | ICD-10-CM | POA: Diagnosis not present

## 2023-01-06 DIAGNOSIS — R319 Hematuria, unspecified: Secondary | ICD-10-CM | POA: Diagnosis present

## 2023-01-06 DIAGNOSIS — R338 Other retention of urine: Secondary | ICD-10-CM | POA: Diagnosis not present

## 2023-01-06 DIAGNOSIS — I5032 Chronic diastolic (congestive) heart failure: Secondary | ICD-10-CM | POA: Diagnosis not present

## 2023-01-06 DIAGNOSIS — I5033 Acute on chronic diastolic (congestive) heart failure: Secondary | ICD-10-CM | POA: Diagnosis not present

## 2023-01-06 DIAGNOSIS — D630 Anemia in neoplastic disease: Secondary | ICD-10-CM | POA: Diagnosis not present

## 2023-01-06 DIAGNOSIS — Z743 Need for continuous supervision: Secondary | ICD-10-CM | POA: Diagnosis not present

## 2023-01-06 DIAGNOSIS — K5900 Constipation, unspecified: Secondary | ICD-10-CM | POA: Diagnosis not present

## 2023-01-06 DIAGNOSIS — Z8249 Family history of ischemic heart disease and other diseases of the circulatory system: Secondary | ICD-10-CM

## 2023-01-06 DIAGNOSIS — M25462 Effusion, left knee: Secondary | ICD-10-CM | POA: Diagnosis not present

## 2023-01-06 DIAGNOSIS — R0902 Hypoxemia: Secondary | ICD-10-CM | POA: Diagnosis not present

## 2023-01-06 DIAGNOSIS — Z7189 Other specified counseling: Secondary | ICD-10-CM

## 2023-01-06 DIAGNOSIS — Z6824 Body mass index (BMI) 24.0-24.9, adult: Secondary | ICD-10-CM

## 2023-01-06 DIAGNOSIS — M2242 Chondromalacia patellae, left knee: Secondary | ICD-10-CM | POA: Diagnosis present

## 2023-01-06 DIAGNOSIS — Z87891 Personal history of nicotine dependence: Secondary | ICD-10-CM

## 2023-01-06 DIAGNOSIS — E1165 Type 2 diabetes mellitus with hyperglycemia: Secondary | ICD-10-CM | POA: Diagnosis present

## 2023-01-06 DIAGNOSIS — I878 Other specified disorders of veins: Secondary | ICD-10-CM | POA: Diagnosis present

## 2023-01-06 DIAGNOSIS — R5383 Other fatigue: Secondary | ICD-10-CM | POA: Diagnosis not present

## 2023-01-06 DIAGNOSIS — K562 Volvulus: Secondary | ICD-10-CM | POA: Diagnosis not present

## 2023-01-06 DIAGNOSIS — Z820 Family history of epilepsy and other diseases of the nervous system: Secondary | ICD-10-CM

## 2023-01-06 DIAGNOSIS — Z803 Family history of malignant neoplasm of breast: Secondary | ICD-10-CM

## 2023-01-06 DIAGNOSIS — I700 Atherosclerosis of aorta: Secondary | ICD-10-CM | POA: Diagnosis not present

## 2023-01-06 DIAGNOSIS — R531 Weakness: Secondary | ICD-10-CM | POA: Diagnosis not present

## 2023-01-06 DIAGNOSIS — D709 Neutropenia, unspecified: Secondary | ICD-10-CM | POA: Diagnosis present

## 2023-01-06 DIAGNOSIS — E039 Hypothyroidism, unspecified: Secondary | ICD-10-CM | POA: Diagnosis not present

## 2023-01-06 DIAGNOSIS — E119 Type 2 diabetes mellitus without complications: Secondary | ICD-10-CM | POA: Diagnosis not present

## 2023-01-06 DIAGNOSIS — C8300 Small cell B-cell lymphoma, unspecified site: Secondary | ICD-10-CM | POA: Diagnosis not present

## 2023-01-06 DIAGNOSIS — Z79899 Other long term (current) drug therapy: Secondary | ICD-10-CM | POA: Diagnosis not present

## 2023-01-06 DIAGNOSIS — I509 Heart failure, unspecified: Secondary | ICD-10-CM | POA: Diagnosis not present

## 2023-01-06 LAB — CBC WITH DIFFERENTIAL/PLATELET
Abs Immature Granulocytes: 0 10*3/uL (ref 0.00–0.07)
Basophils Absolute: 0 10*3/uL (ref 0.0–0.1)
Basophils Relative: 1 %
Eosinophils Absolute: 0.1 10*3/uL (ref 0.0–0.5)
Eosinophils Relative: 4 %
HCT: 30.4 % — ABNORMAL LOW (ref 36.0–46.0)
Hemoglobin: 9.1 g/dL — ABNORMAL LOW (ref 12.0–15.0)
Immature Granulocytes: 0 %
Lymphocytes Relative: 56 %
Lymphs Abs: 1 10*3/uL (ref 0.7–4.0)
MCH: 26.9 pg (ref 26.0–34.0)
MCHC: 29.9 g/dL — ABNORMAL LOW (ref 30.0–36.0)
MCV: 89.9 fL (ref 80.0–100.0)
Monocytes Absolute: 0.4 10*3/uL (ref 0.1–1.0)
Monocytes Relative: 21 %
Neutro Abs: 0.3 10*3/uL — CL (ref 1.7–7.7)
Neutrophils Relative %: 18 %
Platelets: 179 10*3/uL (ref 150–400)
RBC: 3.38 MIL/uL — ABNORMAL LOW (ref 3.87–5.11)
RDW: 15.4 % (ref 11.5–15.5)
Smear Review: NORMAL
WBC: 1.7 10*3/uL — ABNORMAL LOW (ref 4.0–10.5)
nRBC: 0 % (ref 0.0–0.2)

## 2023-01-06 LAB — COMPREHENSIVE METABOLIC PANEL
ALT: 11 U/L (ref 0–44)
AST: 13 U/L — ABNORMAL LOW (ref 15–41)
Albumin: 2.9 g/dL — ABNORMAL LOW (ref 3.5–5.0)
Alkaline Phosphatase: 84 U/L (ref 38–126)
Anion gap: 7 (ref 5–15)
BUN: 24 mg/dL — ABNORMAL HIGH (ref 8–23)
CO2: 34 mmol/L — ABNORMAL HIGH (ref 22–32)
Calcium: 8.5 mg/dL — ABNORMAL LOW (ref 8.9–10.3)
Chloride: 99 mmol/L (ref 98–111)
Creatinine, Ser: 0.8 mg/dL (ref 0.44–1.00)
GFR, Estimated: >60 mL/min (ref >60)
Glucose, Bld: 106 mg/dL — ABNORMAL HIGH (ref 70–99)
Potassium: 4 mmol/L (ref 3.5–5.1)
Sodium: 140 mmol/L (ref 135–145)
Total Bilirubin: 1 mg/dL (ref 0.3–1.2)
Total Protein: 5.2 g/dL — ABNORMAL LOW (ref 6.5–8.1)

## 2023-01-06 LAB — URINALYSIS, ROUTINE W REFLEX MICROSCOPIC
Bilirubin Urine: NEGATIVE
Glucose, UA: NEGATIVE mg/dL
Ketones, ur: NEGATIVE mg/dL
Nitrite: POSITIVE — AB
Protein, ur: 30 mg/dL — AB
Specific Gravity, Urine: 1.013 (ref 1.005–1.030)
WBC, UA: >50 WBC/hpf (ref 0–5)
pH: 5 (ref 5.0–8.0)

## 2023-01-06 LAB — BRAIN NATRIURETIC PEPTIDE: B Natriuretic Peptide: 182.2 pg/mL — ABNORMAL HIGH (ref 0.0–100.0)

## 2023-01-06 MED ORDER — CEPHALEXIN 500 MG PO CAPS: 500.0000 mg | ORAL_CAPSULE | Freq: Four times a day (QID) | ORAL | 0 refills | Status: DC

## 2023-01-06 MED ORDER — LIDOCAINE HCL (PF) 1 % IJ SOLN
5.0000 mL | Freq: Once | INTRAMUSCULAR | Status: AC
  Administered 2023-01-06: 5 mL
  Filled 2023-01-06: qty 5

## 2023-01-06 MED ORDER — INSULIN NPH ISOPHANE & REGULAR (70-30) 100 UNIT/ML ~~LOC~~ SUSP: 4.0000 [IU] | Freq: Two times a day (BID) | SUBCUTANEOUS | 2 refills | Status: AC

## 2023-01-06 MED ORDER — SODIUM CHLORIDE 0.9 % IV SOLN
1.0000 g | Freq: Once | INTRAVENOUS | Status: AC
  Administered 2023-01-06: 1 g via INTRAVENOUS
  Filled 2023-01-06: qty 10

## 2023-01-06 NOTE — Assessment & Plan Note (Addendum)
Requiring 4 L nasal cannula. Not in acute exacerbation. Encouraged to continue inhaler use and DuoNebs PRN. Patient reports trying to get new nebulizer machine through home health.

## 2023-01-06 NOTE — ED Notes (Signed)
Bladder scan volume ~481m

## 2023-01-06 NOTE — ED Triage Notes (Signed)
Pt presents ambulatory to triage via [facility] from home with complaints of urinary retention for the last week. Pt has a hx of CHF wears 4L chronically and has had difficulty with voiding over the last several days with abdominal distention. A&Ox4 at this time. Denies CP or SOB.

## 2023-01-06 NOTE — Assessment & Plan Note (Signed)
Follow-up with Oncology as scheduled. 

## 2023-01-06 NOTE — Assessment & Plan Note (Signed)
Insulin refills sent. Patient reports eating a lot of sweets. Encouraged healthy diet and to follow up with PCP for lab work.

## 2023-01-06 NOTE — Telephone Encounter (Signed)
Paper has been signed.

## 2023-01-06 NOTE — Discharge Instructions (Addendum)
Please follow-up with urology to help manage your Foley catheter.  Additionally please follow-up with orthopedics to help manage your knee pain. Please seek medical attention for any high fevers, chest pain, shortness of breath, change in behavior, persistent vomiting, bloody stool or any other new or concerning symptoms.  Weightbearing as tolerated to the left knee. Keep ACE wrap intact, do not shower until sutures have been removed. Follow-up with [facility] in 10-14 days for suture removal.

## 2023-01-06 NOTE — Telephone Encounter (Signed)
Paperwork was placed on Kaur's desk for completion on 01/05/23.

## 2023-01-06 NOTE — ED Notes (Signed)
Pure wick placed.

## 2023-01-06 NOTE — Assessment & Plan Note (Addendum)
Continue Levothyroxine. Asymptomatic. Needs lab work to check TSH. Encouraged follow up with PCP.

## 2023-01-06 NOTE — ED Provider Notes (Signed)
[facility] Provider Note    Event Date/Time   First MD Initiated Contact with Patient 01/06/23 2026     (approximate)   History   Urinary Retention   HPI  Kerian Bibian Chevere is a 77 y.o. female who presents to the emergency department today because of concerns for urinary retention.  The patient states over the past couple days she has noted she is having a hard time urinating.  She will have the urge to urinate in the however when she tries she will only get a small amount out and then almost immediately feel the need to urinate again.  Patient denies any bad odor to urine.  Patient denies similar symptoms in the past.     Physical Exam   Triage Vital Signs: ED Triage Vitals  Enc Vitals Group     BP 01/06/23 1918 (!) 124/59     Pulse Rate 01/06/23 1918 85     Resp 01/06/23 1918 18     Temp 01/06/23 1918 98.6 F (37 C)     Temp Source 01/06/23 1918 Oral     SpO2 01/06/23 1918 100 %     Weight 01/06/23 1917 145 lb (65.8 kg)     Height 01/06/23 1917 5\' 5"  (1.651 m)     Head Circumference --      Peak Flow --      Pain Score 01/06/23 1916 4     Pain Loc --      Pain Edu? --      Excl. in [place]? --     Most recent vital signs: Vitals:   01/06/23 1918 01/06/23 2030  BP: (!) 124/59 124/66  Pulse: 85 87  Resp: 18   Temp: 98.6 F (37 C)   SpO2: 100% 99%   General: Awake, alert, oriented. CV:  Good peripheral perfusion. Regular rate and rhythm. Resp:  Normal effort. Lungs clear. Abd:  No distention.  Other:  Large left knee effusion. No erythema, no warmth.   ED Results / Procedures / Treatments   Labs (all labs ordered are listed, but only abnormal results are displayed) Labs Reviewed  CBC WITH DIFFERENTIAL/PLATELET - Abnormal; Notable for the following components:      Result Value   WBC 1.7 (*)    RBC 3.38 (*)    Hemoglobin 9.1 (*)    HCT 30.4 (*)    MCHC 29.9 (*)    All other components within normal limits  COMPREHENSIVE METABOLIC  PANEL - Abnormal; Notable for the following components:   CO2 34 (*)    Glucose, Bld 106 (*)    BUN 24 (*)    Calcium 8.5 (*)    Total Protein 5.2 (*)    Albumin 2.9 (*)    AST 13 (*)    All other components within normal limits  BRAIN NATRIURETIC PEPTIDE - Abnormal; Notable for the following components:   B Natriuretic Peptide 182.2 (*)    All other components within normal limits  URINALYSIS, ROUTINE W REFLEX MICROSCOPIC     EKG  None   RADIOLOGY I independently interpreted and visualized the left knee. My interpretation: no fracture Radiology interpretation:  IMPRESSION:  1. Interval development of large left knee effusion.  2. Diffuse subcutaneous edema throughout the visualized left leg,  which has progressed since prior study.  3. No acute bony abnormality.  4. Stable 3 compartmental osteoarthritis.    PROCEDURES:  Critical Care performed: No  Apiration of fluid Performed  by: Nance Pear Consent obtained. Required items: required blood products, implants, devices, and special equipment available Patient identity confirmed: verbally with patient Preparation: Patient was prepped and draped in the usual sterile fashion. Patient tolerance: Patient tolerated the procedure well with no immediate complications.  Location of aspiration: Right knee    MEDICATIONS ORDERED IN ED: Medications - No data to display   IMPRESSION / MDM / [place] / ED COURSE  I reviewed the triage vital signs and the nursing notes.                              Differential diagnosis includes, but is not limited to, infection, stone, neurogenic bladder.  Patient's presentation is most consistent with acute presentation with potential threat to life or bodily function.  Patient presented to the emergency department today because of concerns for urinary retention.  Bladder scan showed almost 500 mL.  Foley catheter was placed patient stated she did feel better.  UA was  concerning for infection.  At this time I do think that is likely what is causing the patient's urinary retention.  In addition the patient also complained of left knee pain.  She states that it started this morning.  When she woke up she states it was swollen and significantly painful.  On exam the knee is swollen.  No overlying erythema.  No appreciable warmth.  However on attempting any range of motion patient had extreme tenderness.  She did have recent admission for lower extremity cellulitis although I do not see any cellulitis overlying the knee.  X-ray was obtained which did not show any acute fracture.  I did discuss with the patient different etiologies of knee effusion and pain.  At this time I do have some concern for possible infection given significant tenderness with attempted range of motion.  We did however discuss risk of obtaining synovial fluid including introducing infection into the joint.  Patient however did consent to the procedure.  Performed.  Patient tolerated the procedure without any difficulty.  Awaiting synovial fluid results at time of signout.   FINAL CLINICAL IMPRESSION(S) / ED DIAGNOSES   Final diagnoses:  Urinary retention  Lower urinary tract infectious disease  Effusion of left knee   Note:  This document was prepared using Dragon voice recognition software and may include unintentional dictation errors.    Nance Pear, MD 01/07/23 [phone]

## 2023-01-07 ENCOUNTER — Other Ambulatory Visit: Payer: Self-pay

## 2023-01-07 ENCOUNTER — Inpatient Hospital Stay: Payer: 59 | Admitting: Anesthesiology

## 2023-01-07 ENCOUNTER — Encounter
Admission: EM | Disposition: A | Discharge status: Home Health | Payer: Self-pay | Source: Home / Self Care | Attending: Internal Medicine

## 2023-01-07 DIAGNOSIS — R0602 Shortness of breath: Secondary | ICD-10-CM | POA: Diagnosis not present

## 2023-01-07 DIAGNOSIS — N390 Urinary tract infection, site not specified: Secondary | ICD-10-CM | POA: Diagnosis present

## 2023-01-07 DIAGNOSIS — D709 Neutropenia, unspecified: Secondary | ICD-10-CM | POA: Diagnosis present

## 2023-01-07 DIAGNOSIS — Z9981 Dependence on supplemental oxygen: Secondary | ICD-10-CM | POA: Diagnosis not present

## 2023-01-07 DIAGNOSIS — E119 Type 2 diabetes mellitus without complications: Secondary | ICD-10-CM | POA: Diagnosis not present

## 2023-01-07 DIAGNOSIS — K562 Volvulus: Secondary | ICD-10-CM | POA: Diagnosis not present

## 2023-01-07 DIAGNOSIS — Z7901 Long term (current) use of anticoagulants: Secondary | ICD-10-CM | POA: Diagnosis not present

## 2023-01-07 DIAGNOSIS — Z66 Do not resuscitate: Secondary | ICD-10-CM | POA: Diagnosis present

## 2023-01-07 DIAGNOSIS — R339 Retention of urine, unspecified: Principal | ICD-10-CM | POA: Diagnosis present

## 2023-01-07 DIAGNOSIS — D638 Anemia in other chronic diseases classified elsewhere: Secondary | ICD-10-CM | POA: Diagnosis present

## 2023-01-07 DIAGNOSIS — R5383 Other fatigue: Secondary | ICD-10-CM | POA: Diagnosis not present

## 2023-01-07 DIAGNOSIS — Z515 Encounter for palliative care: Secondary | ICD-10-CM | POA: Diagnosis not present

## 2023-01-07 DIAGNOSIS — R338 Other retention of urine: Secondary | ICD-10-CM | POA: Diagnosis not present

## 2023-01-07 DIAGNOSIS — D696 Thrombocytopenia, unspecified: Secondary | ICD-10-CM | POA: Diagnosis not present

## 2023-01-07 DIAGNOSIS — M170 Bilateral primary osteoarthritis of knee: Secondary | ICD-10-CM | POA: Diagnosis not present

## 2023-01-07 DIAGNOSIS — M00862 Arthritis due to other bacteria, left knee: Secondary | ICD-10-CM | POA: Diagnosis not present

## 2023-01-07 DIAGNOSIS — Z794 Long term (current) use of insulin: Secondary | ICD-10-CM | POA: Diagnosis not present

## 2023-01-07 DIAGNOSIS — Z79890 Hormone replacement therapy (postmenopausal): Secondary | ICD-10-CM | POA: Diagnosis not present

## 2023-01-07 DIAGNOSIS — B0229 Other postherpetic nervous system involvement: Secondary | ICD-10-CM | POA: Diagnosis present

## 2023-01-07 DIAGNOSIS — R188 Other ascites: Secondary | ICD-10-CM | POA: Diagnosis not present

## 2023-01-07 DIAGNOSIS — M009 Pyogenic arthritis, unspecified: Secondary | ICD-10-CM | POA: Diagnosis present

## 2023-01-07 DIAGNOSIS — E1165 Type 2 diabetes mellitus with hyperglycemia: Secondary | ICD-10-CM | POA: Diagnosis present

## 2023-01-07 DIAGNOSIS — Z87891 Personal history of nicotine dependence: Secondary | ICD-10-CM | POA: Diagnosis not present

## 2023-01-07 DIAGNOSIS — I509 Heart failure, unspecified: Secondary | ICD-10-CM | POA: Diagnosis not present

## 2023-01-07 DIAGNOSIS — M2550 Pain in unspecified joint: Secondary | ICD-10-CM | POA: Diagnosis not present

## 2023-01-07 DIAGNOSIS — J9611 Chronic respiratory failure with hypoxia: Secondary | ICD-10-CM | POA: Diagnosis present

## 2023-01-07 DIAGNOSIS — D649 Anemia, unspecified: Secondary | ICD-10-CM | POA: Diagnosis not present

## 2023-01-07 DIAGNOSIS — R5081 Fever presenting with conditions classified elsewhere: Secondary | ICD-10-CM | POA: Diagnosis not present

## 2023-01-07 DIAGNOSIS — M25462 Effusion, left knee: Secondary | ICD-10-CM | POA: Diagnosis not present

## 2023-01-07 DIAGNOSIS — C884 Extranodal marginal zone B-cell lymphoma of mucosa-associated lymphoid tissue [MALT-lymphoma]: Secondary | ICD-10-CM | POA: Diagnosis present

## 2023-01-07 DIAGNOSIS — R531 Weakness: Secondary | ICD-10-CM | POA: Diagnosis not present

## 2023-01-07 DIAGNOSIS — C9590 Leukemia, unspecified not having achieved remission: Secondary | ICD-10-CM | POA: Diagnosis present

## 2023-01-07 DIAGNOSIS — I5032 Chronic diastolic (congestive) heart failure: Secondary | ICD-10-CM | POA: Diagnosis present

## 2023-01-07 DIAGNOSIS — I700 Atherosclerosis of aorta: Secondary | ICD-10-CM | POA: Diagnosis not present

## 2023-01-07 DIAGNOSIS — N1831 Chronic kidney disease, stage 3a: Secondary | ICD-10-CM | POA: Diagnosis not present

## 2023-01-07 DIAGNOSIS — E46 Unspecified protein-calorie malnutrition: Secondary | ICD-10-CM | POA: Diagnosis present

## 2023-01-07 DIAGNOSIS — J90 Pleural effusion, not elsewhere classified: Secondary | ICD-10-CM | POA: Diagnosis not present

## 2023-01-07 DIAGNOSIS — R627 Adult failure to thrive: Secondary | ICD-10-CM | POA: Diagnosis present

## 2023-01-07 DIAGNOSIS — D61818 Other pancytopenia: Secondary | ICD-10-CM | POA: Diagnosis present

## 2023-01-07 DIAGNOSIS — Z7189 Other specified counseling: Secondary | ICD-10-CM

## 2023-01-07 DIAGNOSIS — C8300 Small cell B-cell lymphoma, unspecified site: Secondary | ICD-10-CM | POA: Diagnosis not present

## 2023-01-07 DIAGNOSIS — R319 Hematuria, unspecified: Secondary | ICD-10-CM | POA: Diagnosis not present

## 2023-01-07 DIAGNOSIS — R109 Unspecified abdominal pain: Secondary | ICD-10-CM | POA: Diagnosis not present

## 2023-01-07 DIAGNOSIS — I5033 Acute on chronic diastolic (congestive) heart failure: Secondary | ICD-10-CM | POA: Diagnosis not present

## 2023-01-07 DIAGNOSIS — J449 Chronic obstructive pulmonary disease, unspecified: Secondary | ICD-10-CM | POA: Diagnosis present

## 2023-01-07 DIAGNOSIS — M109 Gout, unspecified: Secondary | ICD-10-CM | POA: Diagnosis present

## 2023-01-07 HISTORY — PX: INCISION AND DRAINAGE OF WOUND: SHX1803

## 2023-01-07 LAB — SYNOVIAL CELL COUNT + DIFF, W/ CRYSTALS
Crystals, Fluid: NONE SEEN
Crystals, Fluid: NONE SEEN
Eosinophils-Synovial: 0 % (ref 0–1)
Eosinophils-Synovial: 0 %
Lymphocytes-Synovial Fld: 4 % (ref 0–20)
Lymphocytes-Synovial Fld: 12 %
Monocyte-Macrophage-Synovial Fluid: 14 % — ABNORMAL LOW (ref 50–90)
Monocyte-Macrophage-Synovial Fluid: 7 %
Neutrophil, Synovial: 82 % — ABNORMAL HIGH (ref 0–25)
Neutrophil, Synovial: 81 %
Other Cells-SYN: 0
WBC, Synovial: 1534 /mm3 — ABNORMAL HIGH (ref 0–200)
WBC, Synovial: 2692 /mm3 — ABNORMAL HIGH (ref 0–200)

## 2023-01-07 LAB — CBG MONITORING, ED
Glucose-Capillary: 165 mg/dL — ABNORMAL HIGH (ref 70–99)
Glucose-Capillary: 82 mg/dL (ref 70–99)
Glucose-Capillary: 92 mg/dL (ref 70–99)
Glucose-Capillary: 95 mg/dL (ref 70–99)

## 2023-01-07 LAB — PROCALCITONIN: Procalcitonin: 0.21 ng/mL

## 2023-01-07 LAB — GLUCOSE, CAPILLARY
Glucose-Capillary: 105 mg/dL — ABNORMAL HIGH (ref 70–99)
Glucose-Capillary: 247 mg/dL — ABNORMAL HIGH (ref 70–99)

## 2023-01-07 SURGERY — IRRIGATION AND DEBRIDEMENT WOUND
Anesthesia: General | Site: Knee | Laterality: Left

## 2023-01-07 MED ORDER — MORPHINE SULFATE (PF) 2 MG/ML IV SOLN
2.0000 mg | INTRAVENOUS | Status: DC | PRN
  Administered 2023-01-09 – 2023-01-10 (×4): 2 mg via INTRAVENOUS
  Filled 2023-01-07 (×4): qty 1

## 2023-01-07 MED ORDER — CEFAZOLIN SODIUM-DEXTROSE 2-3 GM-%(50ML) IV SOLR
INTRAVENOUS | Status: DC | PRN
  Administered 2023-01-07: 2 g via INTRAVENOUS

## 2023-01-07 MED ORDER — RINGERS IRRIGATION IR SOLN
Status: DC | PRN
  Administered 2023-01-07 (×3): 3000 mL

## 2023-01-07 MED ORDER — FLEET ENEMA 7-19 GM/118ML RE ENEM: 1.0000 | ENEMA | Freq: Once | RECTAL | Status: DC | PRN

## 2023-01-07 MED ORDER — MIDAZOLAM HCL 2 MG/2ML IJ SOLN
INTRAMUSCULAR | Status: AC
  Filled 2023-01-07: qty 2

## 2023-01-07 MED ORDER — ALBUTEROL SULFATE (2.5 MG/3ML) 0.083% IN NEBU: 2.5000 mg | INHALATION_SOLUTION | RESPIRATORY_TRACT | Status: DC | PRN

## 2023-01-07 MED ORDER — IPRATROPIUM-ALBUTEROL 0.5-2.5 (3) MG/3ML IN SOLN
3.0000 mL | RESPIRATORY_TRACT | Status: DC
  Administered 2023-01-07 – 2023-01-08 (×2): 3 mL via RESPIRATORY_TRACT
  Filled 2023-01-07 (×2): qty 3

## 2023-01-07 MED ORDER — LIDOCAINE HCL (PF) 2 % IJ SOLN
INTRAMUSCULAR | Status: AC
  Filled 2023-01-07: qty 5

## 2023-01-07 MED ORDER — ONDANSETRON HCL 4 MG PO TABS: 4.0000 mg | ORAL_TABLET | Freq: Four times a day (QID) | ORAL | Status: DC | PRN

## 2023-01-07 MED ORDER — INSULIN ASPART 100 UNIT/ML IJ SOLN
0.0000 [IU] | INTRAMUSCULAR | Status: DC
  Administered 2023-01-07: 3 [IU] via SUBCUTANEOUS
  Administered 2023-01-07: 5 [IU] via SUBCUTANEOUS
  Administered 2023-01-08: 8 [IU] via SUBCUTANEOUS
  Administered 2023-01-08: 5 [IU] via SUBCUTANEOUS
  Administered 2023-01-08: 3 [IU] via SUBCUTANEOUS
  Administered 2023-01-08: 8 [IU] via SUBCUTANEOUS
  Administered 2023-01-08: 5 [IU] via SUBCUTANEOUS
  Administered 2023-01-08: 8 [IU] via SUBCUTANEOUS
  Administered 2023-01-09: 3 [IU] via SUBCUTANEOUS
  Administered 2023-01-09: 2 [IU] via SUBCUTANEOUS
  Administered 2023-01-10 (×3): 5 [IU] via SUBCUTANEOUS
  Administered 2023-01-10 (×2): 3 [IU] via SUBCUTANEOUS
  Administered 2023-01-10: 5 [IU] via SUBCUTANEOUS
  Administered 2023-01-11 (×2): 2 [IU] via SUBCUTANEOUS
  Administered 2023-01-11 (×3): 3 [IU] via SUBCUTANEOUS
  Administered 2023-01-12 (×3): 2 [IU] via SUBCUTANEOUS
  Administered 2023-01-13: 3 [IU] via SUBCUTANEOUS
  Administered 2023-01-13 (×2): 2 [IU] via SUBCUTANEOUS
  Administered 2023-01-14: 5 [IU] via SUBCUTANEOUS
  Filled 2023-01-07 (×30): qty 1

## 2023-01-07 MED ORDER — SODIUM CHLORIDE 0.9 % IV SOLN: 1.0000 g | INTRAVENOUS | Status: DC

## 2023-01-07 MED ORDER — METOCLOPRAMIDE HCL 5 MG/ML IJ SOLN: 5.0000 mg | Freq: Three times a day (TID) | INTRAMUSCULAR | Status: DC | PRN

## 2023-01-07 MED ORDER — ONDANSETRON HCL 4 MG/2ML IJ SOLN: 4.0000 mg | Freq: Four times a day (QID) | INTRAMUSCULAR | Status: DC | PRN

## 2023-01-07 MED ORDER — VANCOMYCIN HCL 1250 MG/250ML IV SOLN
1250.0000 mg | INTRAVENOUS | Status: DC
  Administered 2023-01-08 – 2023-01-09 (×2): 1250 mg via INTRAVENOUS
  Filled 2023-01-07 (×2): qty 250

## 2023-01-07 MED ORDER — PHENYLEPHRINE HCL (PRESSORS) 10 MG/ML IV SOLN
INTRAVENOUS | Status: DC | PRN
  Administered 2023-01-07: 160 ug via INTRAVENOUS
  Administered 2023-01-07: 80 ug via INTRAVENOUS

## 2023-01-07 MED ORDER — CEFAZOLIN SODIUM-DEXTROSE 2-4 GM/100ML-% IV SOLN: 2.0000 g | INTRAVENOUS | Status: DC

## 2023-01-07 MED ORDER — BUPIVACAINE HCL (PF) 0.5 % IJ SOLN
INTRAMUSCULAR | Status: AC
  Filled 2023-01-07: qty 30

## 2023-01-07 MED ORDER — FENTANYL CITRATE (PF) 100 MCG/2ML IJ SOLN
INTRAMUSCULAR | Status: AC
  Filled 2023-01-07: qty 2

## 2023-01-07 MED ORDER — DIPHENHYDRAMINE HCL 12.5 MG/5ML PO ELIX: 12.5000 mg | ORAL_SOLUTION | ORAL | Status: DC | PRN

## 2023-01-07 MED ORDER — LIDOCAINE HCL (CARDIAC) PF 100 MG/5ML IV SOSY
PREFILLED_SYRINGE | INTRAVENOUS | Status: DC | PRN
  Administered 2023-01-07: 60 mg via INTRAVENOUS

## 2023-01-07 MED ORDER — SODIUM CHLORIDE 0.9 % IV SOLN: INTRAVENOUS | Status: DC | PRN

## 2023-01-07 MED ORDER — TENOFOVIR ALAFENAMIDE FUMARATE 25 MG PO TABS
25.0000 mg | ORAL_TABLET | Freq: Every day | ORAL | Status: DC
  Administered 2023-01-07 – 2023-01-14 (×7): 25 mg via ORAL
  Filled 2023-01-07 (×8): qty 1

## 2023-01-07 MED ORDER — APIXABAN 2.5 MG PO TABS
2.5000 mg | ORAL_TABLET | Freq: Two times a day (BID) | ORAL | Status: DC
  Administered 2023-01-08 (×2): 2.5 mg via ORAL
  Filled 2023-01-07 (×2): qty 1

## 2023-01-07 MED ORDER — ONDANSETRON HCL 4 MG/2ML IJ SOLN: 4.0000 mg | Freq: Once | INTRAMUSCULAR | Status: DC | PRN

## 2023-01-07 MED ORDER — BISACODYL 10 MG RE SUPP: 10.0000 mg | Freq: Every day | RECTAL | Status: DC | PRN

## 2023-01-07 MED ORDER — ACETAMINOPHEN 325 MG PO TABS: 325.0000 mg | ORAL_TABLET | ORAL | Status: DC | PRN

## 2023-01-07 MED ORDER — EPINEPHRINE PF 1 MG/ML IJ SOLN
INTRAMUSCULAR | Status: AC
  Filled 2023-01-07: qty 1

## 2023-01-07 MED ORDER — OXYCODONE-ACETAMINOPHEN 5-325 MG PO TABS
1.0000 | ORAL_TABLET | Freq: Once | ORAL | Status: AC
  Administered 2023-01-07: 1 via ORAL
  Filled 2023-01-07: qty 1

## 2023-01-07 MED ORDER — ACETAMINOPHEN 325 MG PO TABS
650.0000 mg | ORAL_TABLET | Freq: Four times a day (QID) | ORAL | Status: DC | PRN
  Administered 2023-01-07: 650 mg via ORAL
  Filled 2023-01-07: qty 2

## 2023-01-07 MED ORDER — HYDROCODONE-ACETAMINOPHEN 5-325 MG PO TABS
1.0000 | ORAL_TABLET | ORAL | Status: DC | PRN
  Administered 2023-01-07: 1 via ORAL
  Administered 2023-01-07 – 2023-01-12 (×4): 2 via ORAL
  Filled 2023-01-07: qty 2
  Filled 2023-01-07: qty 1
  Filled 2023-01-07 (×3): qty 2

## 2023-01-07 MED ORDER — ONDANSETRON HCL 4 MG/2ML IJ SOLN
4.0000 mg | Freq: Four times a day (QID) | INTRAMUSCULAR | Status: DC | PRN
  Administered 2023-01-09 – 2023-01-10 (×2): 4 mg via INTRAVENOUS
  Filled 2023-01-07 (×2): qty 2

## 2023-01-07 MED ORDER — IPRATROPIUM-ALBUTEROL 0.5-2.5 (3) MG/3ML IN SOLN
RESPIRATORY_TRACT | Status: AC
  Administered 2023-01-07: 3 mL via RESPIRATORY_TRACT
  Filled 2023-01-07: qty 3

## 2023-01-07 MED ORDER — MAGNESIUM HYDROXIDE 400 MG/5ML PO SUSP
30.0000 mL | Freq: Every day | ORAL | Status: DC | PRN
  Administered 2023-01-08: 30 mL via ORAL
  Filled 2023-01-07 (×2): qty 30

## 2023-01-07 MED ORDER — ACETAMINOPHEN 160 MG/5ML PO SOLN: 325.0000 mg | ORAL | Status: DC | PRN

## 2023-01-07 MED ORDER — TENOFOVIR ALAFENAMIDE FUMARATE 25 MG PO TABS
25.0000 mg | ORAL_TABLET | Freq: Every day | ORAL | Status: DC
  Filled 2023-01-07: qty 30

## 2023-01-07 MED ORDER — MIDAZOLAM HCL 2 MG/2ML IJ SOLN
INTRAMUSCULAR | Status: DC | PRN
  Administered 2023-01-07: 1 mg via INTRAVENOUS

## 2023-01-07 MED ORDER — METOCLOPRAMIDE HCL 5 MG PO TABS: 5.0000 mg | ORAL_TABLET | Freq: Three times a day (TID) | ORAL | Status: DC | PRN

## 2023-01-07 MED ORDER — FENTANYL CITRATE (PF) 100 MCG/2ML IJ SOLN
INTRAMUSCULAR | Status: DC | PRN
  Administered 2023-01-07 (×2): 25 ug via INTRAVENOUS

## 2023-01-07 MED ORDER — GENTAMICIN SULFATE 40 MG/ML IJ SOLN
INTRAMUSCULAR | Status: AC
  Filled 2023-01-07: qty 12

## 2023-01-07 MED ORDER — FENTANYL CITRATE (PF) 100 MCG/2ML IJ SOLN: 25.0000 ug | INTRAMUSCULAR | Status: DC | PRN

## 2023-01-07 MED ORDER — LEVOTHYROXINE SODIUM 50 MCG PO TABS
50.0000 ug | ORAL_TABLET | Freq: Every day | ORAL | Status: DC
  Administered 2023-01-07 – 2023-01-14 (×8): 50 ug via ORAL
  Filled 2023-01-07 (×8): qty 1

## 2023-01-07 MED ORDER — ACETAMINOPHEN 650 MG RE SUPP: 650.0000 mg | Freq: Four times a day (QID) | RECTAL | Status: DC | PRN

## 2023-01-07 MED ORDER — LIDOCAINE HCL (PF) 1 % IJ SOLN
INTRAMUSCULAR | Status: AC
  Filled 2023-01-07: qty 30

## 2023-01-07 MED ORDER — DOCUSATE SODIUM 100 MG PO CAPS
100.0000 mg | ORAL_CAPSULE | Freq: Two times a day (BID) | ORAL | Status: DC
  Administered 2023-01-07 – 2023-01-14 (×11): 100 mg via ORAL
  Filled 2023-01-07 (×15): qty 1

## 2023-01-07 MED ORDER — HYDROCODONE-ACETAMINOPHEN 7.5-325 MG PO TABS: 1.0000 | ORAL_TABLET | Freq: Once | ORAL | Status: DC | PRN

## 2023-01-07 MED ORDER — VANCOMYCIN HCL 1500 MG/300ML IV SOLN
1500.0000 mg | Freq: Once | INTRAVENOUS | Status: AC
  Administered 2023-01-07: 1500 mg via INTRAVENOUS
  Filled 2023-01-07: qty 300

## 2023-01-07 MED ORDER — PROPOFOL 10 MG/ML IV BOLUS
INTRAVENOUS | Status: DC | PRN
  Administered 2023-01-07: 100 mg via INTRAVENOUS

## 2023-01-07 MED ORDER — SODIUM CHLORIDE 0.9 % IV SOLN: INTRAVENOUS | Status: DC

## 2023-01-07 MED ORDER — BUPIVACAINE LIPOSOME 1.3 % IJ SUSP
INTRAMUSCULAR | Status: AC
  Filled 2023-01-07: qty 20

## 2023-01-07 MED ORDER — DEXAMETHASONE SODIUM PHOSPHATE 10 MG/ML IJ SOLN
INTRAMUSCULAR | Status: DC | PRN
  Administered 2023-01-07: 8 mg via INTRAVENOUS

## 2023-01-07 MED ORDER — CEFAZOLIN SODIUM-DEXTROSE 2-4 GM/100ML-% IV SOLN
INTRAVENOUS | Status: AC
  Filled 2023-01-07: qty 100

## 2023-01-07 MED ORDER — ONDANSETRON HCL 4 MG/2ML IJ SOLN
INTRAMUSCULAR | Status: DC | PRN
  Administered 2023-01-07: 4 mg via INTRAVENOUS

## 2023-01-07 MED ORDER — SODIUM CHLORIDE 0.9 % IV SOLN
2.0000 g | INTRAVENOUS | Status: DC
  Administered 2023-01-07 – 2023-01-12 (×6): 2 g via INTRAVENOUS
  Filled 2023-01-07: qty 2
  Filled 2023-01-07: qty 20
  Filled 2023-01-07: qty 2
  Filled 2023-01-07 (×3): qty 20
  Filled 2023-01-07: qty 2

## 2023-01-07 MED ORDER — PANTOPRAZOLE SODIUM 40 MG PO TBEC
40.0000 mg | DELAYED_RELEASE_TABLET | Freq: Every day | ORAL | Status: DC
  Administered 2023-01-07 – 2023-01-14 (×7): 40 mg via ORAL
  Filled 2023-01-07 (×9): qty 1

## 2023-01-07 MED ORDER — ALBUTEROL SULFATE HFA 108 (90 BASE) MCG/ACT IN AERS: 1.0000 | INHALATION_SPRAY | RESPIRATORY_TRACT | Status: DC | PRN

## 2023-01-07 SURGICAL SUPPLY — 47 items
BLADE FULL RADIUS 3.5 (BLADE) ×1 IMPLANT
BLADE SHAVER 4.5X7 STR FR (MISCELLANEOUS) ×1 IMPLANT
BNDG ELASTIC 6INX 5YD STR LF (GAUZE/BANDAGES/DRESSINGS) ×1 IMPLANT
BNDG ESMARCH 6 X 12 STRL LF (GAUZE/BANDAGES/DRESSINGS) ×1
BNDG ESMARCH 6X12 STRL LF (GAUZE/BANDAGES/DRESSINGS) ×1 IMPLANT
CATH ROBINSON RED A/P 12FR (CATHETERS) IMPLANT
CHLORAPREP W/TINT 26 (MISCELLANEOUS) ×1 IMPLANT
CNTNR URN SCR LID CUP LEK RST (MISCELLANEOUS) IMPLANT
COLLECTOR GRAFT TISSUE (SYSTAGENIX WOUND MANAGEMENT)
CONT SPEC 4OZ STRL OR WHT (MISCELLANEOUS) ×2
CUFF TOURN SGL QUICK 24 (TOURNIQUET CUFF)
CUFF TOURN SGL QUICK 34 (TOURNIQUET CUFF)
CUFF TRNQT CYL 24X4X16.5-23 (TOURNIQUET CUFF) IMPLANT
CUFF TRNQT CYL 34X4.125X (TOURNIQUET CUFF) IMPLANT
CUP MEDICINE 2OZ PLAST GRAD ST (MISCELLANEOUS) ×1 IMPLANT
DRAPE ARTHRO LIMB 89X125 STRL (DRAPES) ×1 IMPLANT
DRAPE IMP U-DRAPE 54X76 (DRAPES) ×1 IMPLANT
ELECT REM PT RETURN 9FT ADLT (ELECTROSURGICAL) ×1
ELECTRODE REM PT RTRN 9FT ADLT (ELECTROSURGICAL) ×1 IMPLANT
GAUZE SPONGE 4X4 12PLY STRL (GAUZE/BANDAGES/DRESSINGS) ×1 IMPLANT
GAUZE SPONGE 4X4 16PLY XRAY LF (GAUZE/BANDAGES/DRESSINGS) IMPLANT
GAUZE XEROFORM 1X8 LF (GAUZE/BANDAGES/DRESSINGS) IMPLANT
GLOVE BIO SURGEON STRL SZ8 (GLOVE) ×2 IMPLANT
GLOVE SURG UNDER LTX SZ8 (GLOVE) ×1 IMPLANT
GOWN STRL REUS W/ TWL LRG LVL3 (GOWN DISPOSABLE) ×1 IMPLANT
GOWN STRL REUS W/ TWL XL LVL3 (GOWN DISPOSABLE) ×2 IMPLANT
GOWN STRL REUS W/TWL LRG LVL3 (GOWN DISPOSABLE) ×1
GOWN STRL REUS W/TWL XL LVL3 (GOWN DISPOSABLE) ×2
HEMOVAC 400CC 10FR (MISCELLANEOUS) IMPLANT
IV LACTATED RINGER IRRG 3000ML (IV SOLUTION) ×3
IV LR IRRIG 3000ML ARTHROMATIC (IV SOLUTION) ×1 IMPLANT
KIT TURNOVER KIT A (KITS) ×1 IMPLANT
MANIFOLD NEPTUNE II (INSTRUMENTS) ×2 IMPLANT
NDL HYPO 21X1.5 SAFETY (NEEDLE) ×1 IMPLANT
NEEDLE HYPO 21X1.5 SAFETY (NEEDLE) ×1 IMPLANT
PACK ARTHROSCOPY KNEE (MISCELLANEOUS) ×1 IMPLANT
PAD ABD DERMACEA PRESS 5X9 (GAUZE/BANDAGES/DRESSINGS) IMPLANT
SLEEVE REMOTE CONTROL 5X12 (DRAPES) IMPLANT
SUT PROLENE 4 0 PS 2 18 (SUTURE) ×1 IMPLANT
SYR 30ML LL (SYRINGE) ×1 IMPLANT
SYR 50ML LL SCALE MARK (SYRINGE) ×1 IMPLANT
SYR TOOMEY 50ML (SYRINGE) IMPLANT
TISSUE GRAFT COLLECTOR (SYSTAGENIX WOUND MANAGEMENT) IMPLANT
TRAP FLUID SMOKE EVACUATOR (MISCELLANEOUS) ×1 IMPLANT
TUBE SET DOUBLEFLO INFLOW (TUBING) ×1 IMPLANT
WAND WEREWOLF FLOW 90D (MISCELLANEOUS) ×1 IMPLANT
WATER STERILE IRR 500ML POUR (IV SOLUTION) ×1 IMPLANT

## 2023-01-07 NOTE — TOC Initial Note (Signed)
Transition of Care [facility]) - Initial/Assessment Note    Patient Details  Name: Natasha [name] MRN: [id number] Date of Birth: 24-Jul-1946  [facility] of [facility]) CM/SW Contact:    Tiburcio Bash, LCSW Phone Number: 01/07/2023, 2:07 PM  Clinical Narrative:                  Per MD patient/family agreeable to go home with hospice. Choice provided to daughter [name], she chooses [facility]. Marcene Brawn with [facility] informed of choice, plan for potential home with hospice on Monday or Tuesday per MD.   Expected Discharge Plan: [place] Barriers to Discharge: Continued Medical Work up   Patient Goals and CMS Choice            Expected Discharge Plan and Services       Living arrangements for the past 2 months: Single Family Home                                      Prior Living Arrangements/Services Living arrangements for the past 2 months: Single Family Home Lives with:: Self                   Activities of Daily Living      Permission Sought/Granted                  Emotional Assessment              Admission diagnosis:  Lower urinary tract infectious disease [N39.0] Urinary retention [R33.9] Arthritis, septic, knee ([place]) [M00.9] Effusion of left knee [M25.462] Patient Active Problem List   Diagnosis Date Noted   Urinary retention 01/07/2023   Effusion, left knee 01/07/2023   Urinary tract infection 01/07/2023   Pancytopenia ([place]) 12/17/2022   Left leg cellulitis 12/14/2022   Acute GI bleeding 11/12/2022   Acute blood loss anemia 11/11/2022   Hemorrhagic shock ([place]) 11/07/2022   Overweight (BMI 25.0-29.9) 10/21/2022   AKI (acute kidney injury) ([place]) 10/21/2022   Anemia associated with chemotherapy 10/21/2022   Influenza A with pneumonia 10/20/2022   Neutropenia ([place]) 10/20/2022   Myocardial injury 10/20/2022   Type II diabetes mellitus with renal manifestations ([place]) 10/20/2022   Iron deficiency anemia 09/19/2022   COPD  with acute exacerbation ([place]) 08/29/2022   Type 2 diabetes mellitus without complication, with long-term current use of insulin ([place]) 08/29/2022   Normocytic anemia 08/29/2022   Severe sepsis ([place]) 08/29/2022   Chronic respiratory failure with hypoxia ([place]) 08/16/2022   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   Swelling of lower extremity 07/24/2022   Hypotension 07/24/2022   Goals of care, counseling/discussion    COVID-19 virus infection 07/14/2022   Anemia of chronic disease 07/14/2022   Thrombocytopenia ([place]) 07/14/2022   Hypothyroidism, unspecified 07/14/2022   Chronic kidney disease, stage 3a ([place]) 07/14/2022   Bilateral pleural effusion 07/12/2022   Diabetes mellitus, type II ([place]) 07/12/2022   Acute on chronic respiratory failure with hypoxia and hypercarbia ([place]) 07/12/2022   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/12/2022   Lower extremity edema 05/12/2022   Other pancytopenia ([place]) 04/16/2022   Type 2 diabetes mellitus with proteinuria ([place]) 04/09/2022   Atherosclerosis of aorta ([place]) 04/09/2022   Chronic obstructive pulmonary disease (COPD) ([place]) 04/09/2022   SOB (shortness of breath) on exertion 04/09/2022   Acute cough 04/09/2022   Post herpetic neuralgia 03/14/2020   Gall stones 10/23/2019  Lymphoma ([place]) 10/23/2019   Generalized lymphadenopathy 09/08/2018   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   Primary osteoarthritis of both knees 07/07/2018   Senile purpura ([place]) 07/07/2018   PCP:  [name], NP Pharmacy:   [id number] [address], [place] [address] Phone: [phone] Fax: [phone]     Social Determinants of Health ([place]) Social History: [place]: No Food Insecurity (12/15/2022)  Housing: Low Risk  (12/15/2022)  Transportation Needs: Unmet Transportation Needs (12/29/2022)  Utilities: Not At Risk (12/15/2022)  Recent Concern: Utilities - At Risk  (10/06/2022)  Alcohol Screen: Low Risk  (08/24/2022)  Depression (PHQ2-9): Low Risk  (08/24/2022)  Financial Resource Strain: Medium Risk (08/24/2022)  Physical Activity: Inactive (08/24/2022)  Social Connections: Socially Isolated (08/24/2022)  Stress: Stress Concern Present (08/24/2022)  Tobacco Use: Medium Risk (01/06/2023)   SDOH Interventions:     Readmission Risk Interventions    11/08/2022   10:53 AM 10/21/2022    1:13 PM 08/17/2022   11:05 AM  Readmission Risk Prevention Plan  Transportation Screening Complete Complete Complete  PCP or Specialist Appt within 3-5 Days   Complete  HRI or [place]   Complete  Social Work Consult for [place] Planning/Counseling   Complete  Palliative Care Screening   Not Applicable  Medication Review Press photographer) Complete Complete Complete  PCP or Specialist appointment within 3-5 days of discharge Complete Complete   HRI or [place] Complete Complete   SW Recovery Care/Counseling Consult Complete Complete   Palliative Care Screening Complete Not [place] Not Applicable Not Applicable

## 2023-01-07 NOTE — Progress Notes (Signed)
Pharmacy Antibiotic Note  Natasha [name] is a 77 y.o. female admitted on 01/06/2023 with cellulitis.  Pharmacy has been consulted for Vancomycin dosing.  Plan: Vancomycin 1500 mg IV X 1 given in ED on 3/15 @ 0327. Vancomycin 1250 mg IV Q24H ordered to start on 3/15 @ 0300.  AUC = 530 Vanc trough = 12.3   Height: 5\' 5"  (165.1 cm) Weight: 65.8 kg (145 lb) IBW/kg (Calculated) : 57  Temp (24hrs), Avg:98.6 F (37 C), Min:98.5 F (36.9 C), Max:98.6 F (37 C)  Recent Labs  Lab 01/06/23 1920  WBC 1.7*  CREATININE 0.80    Estimated Creatinine Clearance: 54.7 mL/min (by C-G formula based on SCr of 0.8 mg/dL).    No Known Allergies  Antimicrobials this admission:   >>    >>   Dose adjustments this admission:   Microbiology results:  BCx:   UCx:    Sputum:    MRSA PCR:   Thank you for allowing pharmacy to be a part of this patient's care.  Tiffay Pinette D 01/07/2023 3:47 AM

## 2023-01-07 NOTE — Anesthesia Preprocedure Evaluation (Addendum)
Anesthesia Evaluation  Patient identified by MRN, date of birth, ID band Patient awake    Reviewed: Allergy & Precautions, NPO status , Patient's Chart, lab work & pertinent test results  Airway Mallampati: III  TM Distance: >3 FB Neck ROM: full    Dental  (+) Chipped, Upper Dentures, Missing, Poor Dentition   Pulmonary pneumonia, resolved, COPD,  oxygen dependent, former smoker 3l [place]    Pulmonary exam normal        Cardiovascular + Peripheral Vascular Disease and +CHF  Normal cardiovascular exam  06/2022 Echo 1. Left ventricular ejection fraction, by estimation, is 60 to 65%. The  left ventricle has normal function. The left ventricle has no regional  wall motion abnormalities. Left ventricular diastolic parameters were  normal.   2. Right ventricular systolic function is normal. The right ventricular  size is normal. There is mildly elevated pulmonary artery systolic  pressure.   3. The mitral valve is normal in structure. Mild mitral valve  regurgitation. No evidence of mitral stenosis.   4. Tricuspid valve regurgitation is mild to moderate.   5. The aortic valve has an indeterminant number of cusps. There is mild  thickening of the aortic valve. Aortic valve regurgitation is not  visualized. Aortic valve sclerosis is present, with no evidence of aortic  valve stenosis.   6. The inferior vena cava is normal in size with greater than 50%  respiratory variability, suggesting right atrial pressure of 3 mmHg.     Neuro/Psych negative neurological ROS  negative psych ROS   GI/Hepatic negative GI ROS, Neg liver ROS,,,  Endo/Other  diabetesHypothyroidism    Renal/GU Renal disease     Musculoskeletal   Abdominal   Peds  Hematology  (+) Blood dyscrasia, anemia   Anesthesia Other Findings Past Medical History: No date: Anemia No date: Cancer [facility])     Comment:  lymphoma-stomach  No date: Cancer ([place])      Comment:  leulemia No date: CHF (congestive heart failure) (HCC) No date: COPD (chronic obstructive pulmonary disease) (HCC) No date: Diabetes mellitus without complication (HCC) No date: Hypotension No date: Pleural effusion     Comment:  ARMc 811ml,  2 weeks ago No date: Vaginal delivery     Comment:  x 5  Past Surgical History: 08/16/2022: IR IMAGING GUIDED PORT INSERTION No date: TONSILLECTOMY; Bilateral     Comment:  as a child  BMI    Body Mass Index: 24.13 kg/m      Reproductive/Obstetrics negative OB ROS                             Anesthesia Physical Anesthesia Plan  ASA: 4  Anesthesia Plan: General LMA   Post-op Pain Management:    Induction: Intravenous  PONV Risk Score and Plan: Dexamethasone, Ondansetron and Treatment may vary due to age or medical condition  Airway Management Planned: LMA  Additional Equipment:   Intra-op Plan:   Post-operative Plan: Extubation in OR  Informed Consent: I have reviewed the patients History and Physical, chart, labs and discussed the procedure including the risks, benefits and alternatives for the proposed anesthesia with the patient or authorized representative who has indicated his/her understanding and acceptance.     Dental Advisory Given  Plan Discussed with: Anesthesiologist, CRNA and Surgeon  Anesthesia Plan Comments: (Patient consented for risks of anesthesia including but not limited to:  - adverse reactions to medications - damage to eyes, teeth, lips  or other oral mucosa - nerve damage due to positioning  - sore throat or hoarseness - Damage to heart, brain, nerves, lungs, other parts of body or loss of life  Patient voiced understanding.)       Anesthesia Quick Evaluation

## 2023-01-07 NOTE — Consult Note (Signed)
NAME: Natasha [name]  DOB: 1946/09/18  MRN: [id number]  Date/Time: 01/07/2023 11:18 AM  REQUESTING PROVIDER: Dr.shah Subjective:  REASON FOR CONSULT: left knee septic arthritis ?pt is a limited historian, chart reviewed Natasha [name] is a 77 y.o. female with a history of nodal marginal  B cell lymphoma, rt pleural effusion due to lymphoma, extensive lymphadenopathy, bone marrow involvement, hemorrhagic shock with PRBC in Jan 2024 presents with difficulty in passing urine- apparently having some trouble for the past week-but she had been to her PCP on 01/05/2023 and did not mention about that.  She had gone that to get her refills on her insulin.  But patient says  her left knee  was very painful when she woke up and was swollen, Between 12/14/2022 until 12/17/2022 for left leg cellulitis and was treated with antibiotics.  In the ED vitals were  01/06/23  BP 120/66  Temp 98.6 F (37 C)  Pulse Rate 86  Resp 18  SpO2 100 %  O2 Flow Rate (L/min) 4 L/min    Latest Reference Range & Units 01/06/23  WBC 4.0 - 10.5 K/uL 1.7 (L)  Hemoglobin 12.0 - 15.0 g/dL 9.1 (L)  HCT 36.0 - 46.0 % 30.4 (L)  Platelets 150 - 400 K/uL 179  Creatinine 0.44 - 1.00 mg/dL 0.80   She had urine retention of 600cc- foley placed Urinalysis showed more than 50 WBC. Blood culture and urine culture was sent. Her left knee was aspirated and it had 1534 cells with 82% neutrophils. She was taken for arthroscopy , wash out today, culture sen Pt on vanco and ceftriaxone She gets rituximab for the nodal marginal cell lymphoma and the last she to the medicine was on 12/29/2022. Multiple hospitalizations  in the past 5 months  Past Medical History:  Diagnosis Date   Anemia    Cancer ([place])    lymphoma-stomach    Cancer ([place])    leulemia   CHF (congestive heart failure) (HCC)    COPD (chronic obstructive pulmonary disease) (HCC)    Diabetes mellitus without complication (HCC)    Hypotension    Pleural effusion    ARMc  869ml,  2 weeks ago   Vaginal delivery    x 5    Past Surgical History:  Procedure Laterality Date   IR IMAGING GUIDED PORT INSERTION  08/16/2022   TONSILLECTOMY Bilateral    as a child    Social History   Socioeconomic History   Marital status: Widowed    Spouse name: Not on file   Number of children: 2   Years of education: Not on file   Highest education level: 9th grade  Occupational History   Occupation: unemployed  Tobacco Use   Smoking status: Former    Packs/day: 2.00    Years: 55.00    Additional pack years: 0.00    Total pack years: 110.00    Types: Cigarettes    Start date: 07/07/1961    Quit date: 08/02/2022    Years since quitting: 0.4   Smokeless tobacco: Never   Tobacco comments:    1/2 pack she states but family says 2 PPD  Vaping Use   Vaping Use: Never used  Substance and Sexual Activity   Alcohol use: No   Drug use: Never   Sexual activity: Not Currently    Partners: Male    Birth control/protection: Post-menopausal  Other Topics Concern   Not on file  Social History Narrative   08/14/20   From:  [place]: to be near daughter   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      Enjoys: stays at home      Exercise: walking to her daughter's store   Diet: eats fruit, air fried chicken      Safety   Seat belts: Yes    Guns: No   Safe in relationships: Yes    Social Determinants of Health   Financial Resource Strain: Medium Risk (08/24/2022)   Overall Financial Resource Strain (CARDIA)    Difficulty of Paying Living Expenses: Somewhat hard  Food Insecurity: No Food Insecurity (12/15/2022)   Hunger Vital Sign    Worried About Running Out of Food in the Last Year: Never true    Ran Out of Food in the Last Year: Never true  Transportation Needs: Unmet Transportation Needs (12/29/2022)   PRAPARE - Transportation    Lack of Transportation (Medical): Yes     Lack of Transportation (Non-Medical): Yes  Physical Activity: Inactive (08/24/2022)   Exercise Vital Sign    Days of Exercise per Week: 0 days    Minutes of Exercise per Session: 0 min  Stress: Stress Concern Present (08/24/2022)   [place]    Feeling of Stress : To some extent  Social Connections: Socially Isolated (08/24/2022)   Social Connection and Isolation Panel [NHANES]    Frequency of Communication with Friends and Family: More than three times a week    Frequency of Social Gatherings with Friends and Family: More than three times a week    Attends Religious Services: Never    Marine scientist or Organizations: No    Attends Archivist Meetings: Never    Marital Status: Widowed  Intimate Partner Violence: Not At Risk (12/15/2022)   Humiliation, Afraid, Rape, and Kick questionnaire    Fear of Current or Ex-Partner: No    Emotionally Abused: No    Physically Abused: No    Sexually Abused: No    Family History  Problem Relation Age of Onset   Cancer Mother    Breast cancer Mother    Cancer Father    Alzheimer's disease Father    Lung cancer Father        lung   Varicose Veins Brother    Heart attack Brother    No Known Allergies I? Current Facility-Administered Medications  Medication Dose Route Frequency Provider Last Rate Last Admin   acetaminophen (TYLENOL) tablet 650 mg  650 mg Oral Q6H PRN Athena Masse, MD   650 mg at 01/07/23 [id number]   Or   acetaminophen (TYLENOL) suppository 650 mg  650 mg Rectal Q6H PRN Athena Masse, MD       albuterol (PROVENTIL) (2.5 MG/3ML) 0.083% nebulizer solution 2.5 mg  2.5 mg Nebulization Q2H PRN Athena Masse, MD       ceFAZolin (ANCEF) IVPB 2g/100 mL premix  2 g Intravenous 30 min Pre-Op Poggi, Marshall Cork, MD       cefTRIAXone (ROCEPHIN) 1 g in sodium chloride 0.9 % 100 mL IVPB  1 g Intravenous Q24H Athena Masse, MD       HYDROcodone-acetaminophen  (NORCO/VICODIN) 5-325 MG per tablet 1-2 tablet  1-2 tablet Oral Q4H PRN Athena Masse, MD       insulin aspart (novoLOG) injection 0-15 Units  0-15 Units Subcutaneous  Q4H Athena Masse, MD   3 Units at 01/07/23 0445   levothyroxine (SYNTHROID) tablet 50 mcg  50 mcg Oral Q0600 Athena Masse, MD   50 mcg at 01/07/23 0600   morphine (PF) 2 MG/ML injection 2 mg  2 mg Intravenous Q2H PRN Athena Masse, MD       ondansetron [facility]) tablet 4 mg  4 mg Oral Q6H PRN Athena Masse, MD       Or   ondansetron [facility]) injection 4 mg  4 mg Intravenous Q6H PRN Athena Masse, MD       pantoprazole (PROTONIX) EC tablet 40 mg  40 mg Oral Daily Athena Masse, MD   40 mg at 01/07/23 [id number]   Tenofovir Alafenamide Fumarate TABS 25 mg  25 mg Oral Daily Athena Masse, MD   25 mg at 01/07/23 0942   [START ON 01/08/2023] vancomycin (VANCOREADY) IVPB 1250 mg/250 mL  1,250 mg Intravenous Q24H Athena Masse, MD       Current Outpatient Medications  Medication Sig Dispense Refill   acyclovir (ZOVIRAX) 400 MG tablet Take 400 mg by mouth daily.     albuterol (VENTOLIN HFA) 108 (90 Base) MCG/ACT inhaler TAKE 2 PUFFS BY MOUTH EVERY 6 HOURS AS NEEDED FOR WHEEZE OR SHORTNESS OF BREATH 18 g 1   apixaban (ELIQUIS) 2.5 MG TABS tablet Take 1 tablet (2.5 mg total) by mouth 2 (two) times daily. 60 tablet 3   cephALEXin (KEFLEX) 500 MG capsule Take 1 capsule (500 mg total) by mouth 4 (four) times daily for 10 days. 40 capsule 0   clobetasol cream (TEMOVATE) [id number] % Apply 1 Application topically daily. 30 g 0   furosemide (LASIX) 20 MG tablet Take 1 tablet (20 mg total) by mouth daily. 90 tablet 3   guaiFENesin-dextromethorphan (ROBITUSSIN DM) 100-10 MG/5ML syrup Take 5 mLs by mouth every 4 (four) hours as needed for cough. 118 mL 0   insulin NPH-regular Human (70-30) 100 UNIT/ML injection Inject 4 Units into the skin 2 (two) times daily with a meal. Use only for hyperglycemia, CBG+ 300. Start with 5 units, titrate up to  max 10 unit per dose if need. 10 mL 2   ipratropium-albuterol (DUONEB) 0.5-2.5 (3) MG/3ML SOLN Take 3 mLs by nebulization every 6 (six) hours as needed (shortness of breath or wheezing). 360 mL 0   lenalidomide (REVLIMID) 5 MG capsule Take 1 capsule (5 mg total) by mouth daily. Take for 21 days, then hold for 7 days. Repeat every 28 days. 21 capsule 0   levothyroxine (SYNTHROID) 50 MCG tablet Take 1 tablet (50 mcg total) by mouth daily at 6 (six) AM. 30 tablet 0   loperamide (IMODIUM) 2 MG capsule Take 2 mg by mouth as needed for diarrhea or loose stools.     midodrine (PROAMATINE) 10 MG tablet Take 1 tablet (10 mg total) by mouth 3 (three) times daily with meals. 60 tablet 0   nystatin-triamcinolone ointment (MYCOLOG) Apply 1 Application topically 2 (two) times daily. 30 g 0   ondansetron (ZOFRAN) 8 MG tablet Take 8 mg by mouth every 8 (eight) hours as needed for nausea, vomiting or refractory nausea / vomiting. Take 1 tablet q6H for 4 days after chemotherapy.     pantoprazole (PROTONIX) 40 MG tablet Take 1 tablet (40 mg total) by mouth daily. 30 tablet 0   prochlorperazine (COMPAZINE) 10 MG tablet Take 1 tablet by mouth every 6 (six) hours as needed for nausea  or vomiting. Take 1 tab q6H for 4 days after chemotherapy.     simethicone (MYLICON) 80 MG chewable tablet Chew 180 mg by mouth every 6 (six) hours as needed for flatulence. Take 1 capsule PO PRN gas relief.     Tenofovir Alafenamide Fumarate (VEMLIDY) 25 MG TABS Take 1 tablet (25 mg total) by mouth daily. 30 tablet 3   traMADol (ULTRAM) 50 MG tablet Take 1 tablet (50 mg total) by mouth every 6 (six) hours as needed for moderate pain or severe pain. 30 tablet 0   Accu-Chek Softclix Lancets lancets 1 each 3 (three) times daily.     Blood Glucose Monitoring Suppl DEVI 1 each by Does not apply route in the morning, at noon, and at bedtime. May substitute to any manufacturer covered by patient's insurance. 1 each 0   Glucose Blood (BLOOD GLUCOSE  TEST STRIPS) STRP 1 each by In Vitro route in the morning, at noon, and at bedtime. May substitute to any manufacturer covered by patient's insurance. 100 strip 0     Abtx:  Anti-infectives (From admission, onward)    Start     Dose/Rate Route Frequency Ordered Stop   01/08/23 0300  vancomycin (VANCOREADY) IVPB 1250 mg/250 mL        1,250 mg 166.7 mL/hr over 90 Minutes Intravenous Every 24 hours 01/07/23 0343     01/07/23 2200  cefTRIAXone (ROCEPHIN) 1 g in sodium chloride 0.9 % 100 mL IVPB        1 g 200 mL/hr over 30 Minutes Intravenous Every 24 hours 01/07/23 0428     01/07/23 1000  Tenofovir Alafenamide Fumarate TABS 25 mg  Status:  Discontinued        25 mg Oral Daily 01/07/23 0336 01/07/23 0600   01/07/23 1000  Tenofovir Alafenamide Fumarate TABS 25 mg        25 mg Oral Daily 01/07/23 0600     01/07/23 0749  ceFAZolin (ANCEF) IVPB 2g/100 mL premix        2 g 200 mL/hr over 30 Minutes Intravenous 30 min pre-op 01/07/23 0750     01/07/23 0245  vancomycin (VANCOREADY) IVPB 1500 mg/300 mL        1,500 mg 150 mL/hr over 120 Minutes Intravenous  Once 01/07/23 0237 01/07/23 0444   01/06/23 2215  cefTRIAXone (ROCEPHIN) 1 g in sodium chloride 0.9 % 100 mL IVPB        1 g 200 mL/hr over 30 Minutes Intravenous  Once 01/06/23 2204 01/06/23 2256   01/06/23 0000  cephALEXin (KEFLEX) 500 MG capsule        500 mg Oral 4 times daily 01/06/23 2244 01/16/23 2359       REVIEW OF SYSTEMS:  Const:  fever, negative chills, negative weight loss Eyes: negative diplopia or visual changes, negative eye pain ENT: negative coryza, negative sore throat Resp: negative cough, hemoptysis, has dyspnea Cards: negative for chest pain, palpitations,has b/l  lower extremity edema GU: as above GI: abdominal distension Skin: negative for rash and pruritus Heme: negative for easy bruising and gum/nose bleeding MS: weakness Neurolo:negative for headaches, dizziness, vertigo, memory problems  Psych: negative  for feelings of anxiety, depression  Endocrine: , diabetes Allergy/Immunology- negative for any medication or food allergies ?  Objective:  VITALS:  BP (!) 120/55   Pulse 98   Temp (!) 101 F (38.3 C) (Oral)   Resp 16   Ht 5\' 5"  (1.651 m)   Wt 65.8 kg   SpO2  98%   BMI 24.13 kg/m  LDA Foley  PHYSICAL EXAM:  General: Alert, cooperative, no distress, appears stated age.  Head: Normocephalic, without obvious abnormality, atraumatic. Eyes: Conjunctivae clear, anicteric sclerae. Pupils are equal ENT Nares normal. No drainage or sinus tenderness. Lips, mucosa, and tongue normal. No Thrush Neck: Supple, symmetrical, no adenopathy, thyroid: non tender no carotid bruit and no JVD. Back: No CVA tenderness. Lungs: b/l air ntry- decreased rt base Heart: Regular rate and rhythm, no murmur, rub or gallop. Abdomen: Soft, distended. Bowel sounds normal. splenomegaly Extremities: b/l edema left > rt= Venous stasis    Skin: as above Lymph: Cervical, supraclavicular normal. Neurologic: Grossly non-focal Pertinent Labs Lab Results CBC    Component Value Date/Time   WBC 1.7 (L) 01/06/2023 1920   RBC 3.38 (L) 01/06/2023 1920   HGB 9.1 (L) 01/06/2023 1920   HGB 8.0 (L) 06/22/2022 0939   HCT 30.4 (L) 01/06/2023 1920   HCT 26.6 (L) 06/22/2022 0939   PLT 179 01/06/2023 1920   PLT 77 (LL) 06/22/2022 0939   MCV 89.9 01/06/2023 1920   MCV 93 06/22/2022 0939   MCH 26.9 01/06/2023 1920   MCHC 29.9 (L) 01/06/2023 1920   RDW 15.4 01/06/2023 1920   RDW 17.3 (H) 06/22/2022 0939   LYMPHSABS 1.0 01/06/2023 1920   LYMPHSABS 3.1 06/22/2022 0939   MONOABS 0.4 01/06/2023 1920   EOSABS 0.1 01/06/2023 1920   EOSABS 0.1 06/22/2022 0939   BASOSABS 0.0 01/06/2023 1920   BASOSABS 0.0 06/22/2022 0939       Latest Ref Rng & Units 01/06/2023    7:20 PM 12/29/2022    9:00 AM 12/22/2022    8:01 AM  CMP  Glucose 70 - 99 mg/dL 106  212  180   BUN 8 - 23 mg/dL 24  28  38   Creatinine 0.44 - 1.00  mg/dL 0.80  0.65  0.62   Sodium 135 - 145 mmol/L 140  140  142   Potassium 3.5 - 5.1 mmol/L 4.0  4.5  4.4   Chloride 98 - 111 mmol/L 99  98  100   CO2 22 - 32 mmol/L 34  35  35   Calcium 8.9 - 10.3 mg/dL 8.5  9.4  10.1   Total Protein 6.5 - 8.1 g/dL 5.2  5.0  5.3   Total Bilirubin 0.3 - 1.2 mg/dL 1.0  0.6  0.6   Alkaline Phos 38 - 126 U/L 84  101  99   AST 15 - 41 U/L 13  14  17    ALT 0 - 44 U/L 11  14  13        Microbiology: Recent Results (from the past 240 hour(s))  Body fluid culture w Gram Stain     Status: None (Preliminary result)   Collection Time: 01/06/23 11:45 PM   Specimen: KNEE; Body Fluid  Result Value Ref Range Status   Specimen Description   Final    KNEE Performed at [facility], [address], [place], [place] [address]    Special Requests   Final    NONE Performed at [facility], [address] [place], [place] [address]    Gram Stain   Final    FEW WBC PRESENT,BOTH PMN AND MONONUCLEAR NO ORGANISMS SEEN Performed at [facility], [place] [address], [place], [place] [address]    Culture PENDING  Incomplete   Report Status PENDING  Incomplete  Blood culture (routine x 2)  Status: None (Preliminary result)   Collection Time: 01/07/23  2:55 AM   Specimen: BLOOD  Result Value Ref Range Status   Specimen Description BLOOD BLOOD RIGHT ARM  Final   Special Requests   Final    BOTTLES DRAWN AEROBIC AND ANAEROBIC Blood Culture results may not be optimal due to an inadequate volume of blood received in culture bottles   Culture   Final    NO GROWTH <12 HOURS Performed at [facility], [address] [place], [place] [address]    Report Status PENDING  Incomplete  Blood culture (routine x 2)     Status: None (Preliminary result)   Collection Time: 01/07/23  2:55 AM   Specimen: BLOOD  Result Value Ref Range Status   Specimen Description BLOOD BLOOD LEFT ARM  Final   Special Requests   Final    BOTTLES DRAWN AEROBIC  AND ANAEROBIC Blood Culture adequate volume   Culture   Final    NO GROWTH <12 HOURS Performed at [facility], [address] [place], [place] [address]    Report Status PENDING  Incomplete    IMAGING RESULTS: Xray left knee- effusion I have personally reviewed the films ? Impression/Recommendation ? Left knee effusion- sudden onset with pain- underlying edmea legs left > rt Synovial fluid cell count was not suggestive of septic arthritis but she has neutropenia /Leucopenia which could falsely lower the cell count in the synovial fluid Will send fluid for cytology to look for lymphoma cells Okay to give ceftriaxone and vanco  Febrile neutropenia Recommend CXR  Urinary retention- unclear why- will do renal US- may consider CT abdomen  Nodal marginal Bcell lymphoma with extensive involvement LN, bone marrow, Pleural effusion- on rituximab, lenalidomide  Chronic Edema legs ? Anemia ___________________________________________________ Discussed with patient, requesting provider [facility] available by phone for urgnt issues this weekend Note:  This document was prepared using Dragon voice recognition software and may include unintentional dictation errors.

## 2023-01-07 NOTE — ED Provider Notes (Addendum)
Patient signed out to me pending results of arthrocentesis. 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  She is found to have 500 cc in her bladder Foley placed and likely has UTI.  She also has not been able to bear much weight on the left knee since yesterday.  Dr. Archie Balboa did perform an arthrocentesis and results are pending at the time of signout.   Patient's synovial fluid shows 1500 white cells 82% neutrophils no crystals.  When I examined the patient she is quite tender at the knee somewhat warm and she has significant pain with really any movement and I would say she has positive pain with micro movements.  While the fluids results are not consistent with a typical septic joint patient has significant leukopenia and neutropenia at baseline so question whether she just may not mount the same white cell response in the synovial fluid that we would expect.  Will discuss with orthopedics.  Discussed with Dr. Roland Rack who agrees with admission for observation he will see the patient in the morning.  Recommends keeping her n.p.o.   Rada Hay, MD 01/07/23 0236    Rada Hay, MD 01/07/23 [phone]

## 2023-01-07 NOTE — Assessment & Plan Note (Addendum)
Chronic respiratory failure with hypoxia Not acutely exacerbated Continue home inhalers with DuoNebs as needed On room air

## 2023-01-07 NOTE — Op Note (Signed)
01/07/2023  2:34 PM  Patient:   Natasha [name]  Pre-Op Diagnosis:   Acute knee effusion with possible septic arthritis, left knee.  Post-Op Diagnosis:   Same with underlying degenerative joint disease and gout, left knee.  Procedure:   Arthroscopic irrigation and debridement of left knee.  Surgeon:   Pascal Lux, MD  Assistant:   None  Anesthesia:   General LMA  Findings:   As above.  Complications:   None  Fluids:   500 cc crystalloid  EBL:   2 cc  UOP:   650 cc  TT:   47 minutes at 300 mmHg  Drains:   Hemovac x 1  Closure:   4-0 Prolene interrupted sutures  Brief Clinical Note:   The patient is a 77 year old female with multiple medical problems who developed the acute onset of left knee pain and swelling yesterday morning without any antecedent injury.  Her history and examination are suspicious for septic arthritis as she is immunosuppressed with a chronically suppressed white cell count, has insulin-dependent diabetes, and chronic venous stasis ulcers bilaterally.  Although the white cell count of her knee aspirate was only 1500, in light of the serum white blood cell count of 1700, it was felt to be in the best interest of the patient to proceed with an arthroscopic irrigation and debridement of her left knee.  Procedure:   The patient was brought into the operating room and laid in the supine position.  After adequate general laryngeal mask anesthesia was obtained, the patient's left lower extremity was prepped with ChloraPrep solution before being draped sterilely.  Preoperative antibiotics were administered.  A timeout was performed to verify the appropriate surgical site while the leg was held in an elevated position for several minutes.  The tourniquet was then inflated to 300 mmHg.  The camera was placed through the anterolateral portal while instrumentation was formed to the anteromedial portal.  The knee was sequentially examined beginning the suprapatella  pouch and progressing to the patellofemoral compartment, medial gutter and compartment, the notch, and finally the lateral compartment and gutter.  The findings were as described above.  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  The medial and lateral menisci both exhibited substantial degenerative fraying with calcium deposition throughout both menisci.  The medial and lateral menisci were debrided back to stable margins using the full-radius resector and diffuse synovitic changes also were debrided back to stable margins using the full-radius resector.  The knee was irrigated thoroughly using first 3 L of sterile saline followed by 3 L of antibiotic irrigation, and finally an additional 3 L of sterile saline solution.  The instruments were removed after suctioning the excess fluid.    A single limb to Hemovac drain was placed before the portal sites were closed using 4-0 Prolene interrupted sutures.  A sterile bulky dressing was applied to the knee before the patient was awakened, extubated, and returned to the recovery room in satisfactory condition after tolerating the procedure well.

## 2023-01-07 NOTE — Telephone Encounter (Signed)
Was this faxed yesterday? 

## 2023-01-07 NOTE — Assessment & Plan Note (Signed)
Pancytopenia, hemoglobin 7.7.  Oncology consult

## 2023-01-07 NOTE — Assessment & Plan Note (Addendum)
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  Consult oncology.  Overall poor prognosis daughter in agreement with keeping her DNR and have hospice follow at discharge. 

## 2023-01-07 NOTE — Assessment & Plan Note (Signed)
Acute urinary retention Foley catheter was placed in the ED Voiding trial in the a.m. Rocephin for UTI Follow cultures

## 2023-01-07 NOTE — Transfer of Care (Signed)
Immediate Anesthesia Transfer of Care Note  Patient: Natasha [name]  Procedure(s) Performed: ARTHROSCOPIC LEFT KNEE WASHOUT (Left: Knee)  Patient Location: PACU  Anesthesia Type:General  Level of Consciousness: drowsy  Airway & Oxygen Therapy: Patient Spontanous Breathing and Patient connected to face mask oxygen  Post-op Assessment: Report given to RN and Post -op Vital signs reviewed and stable  Post vital signs: Reviewed and stable  Last Vitals:  Vitals Value Taken Time  BP 105/54 01/07/23 1430  Temp 36.9 C 01/07/23 1430  Pulse 82 01/07/23 1434  Resp 16 01/07/23 1434  SpO2 100 % 01/07/23 1434  Vitals shown include unvalidated device data.  Last Pain:  Vitals:   01/07/23 1049  TempSrc: Oral  PainSc:          Complications: No notable events documented.

## 2023-01-07 NOTE — IPAL (Signed)
  Interdisciplinary Goals of Care Family Meeting   Date carried out: 01/07/2023  Location of the meeting: Phone conference  Member's involved: Physician and Family Member or next of kin  Durable Power of Attorney or acting medical decision maker: Daughter [name]  Discussion: We discussed goals of care for [employer]   Code status:   Code Status: DNR   Disposition: Home with Hospice  Time spent for the meeting: 35 minutes    Max Sane, MD  01/07/2023, 1:31 PM

## 2023-01-07 NOTE — Final Progress Note (Signed)
Same day rounding progress note  Patient seen and examined while in the ED.  Please see Dr. Josefine Class dictated history and physical for further details.  I agree with her assessment and plan  Suspected left septic knee -Orthopedic planning for arthroscopic irrigation and debridement of left knee today -ID consult -Continue IV Rocephin and vancomycin  UTI Acute urinary retention Foley placed while in the ED.  Continue IV Rocephin  Time spent: 15 minutes

## 2023-01-07 NOTE — Anesthesia Procedure Notes (Signed)
Procedure Name: LMA Insertion Date/Time: 01/07/2023 1:15 PM  Performed by: Aline Brochure, CRNAPre-anesthesia Checklist: Patient identified, Patient being monitored, Timeout performed, Emergency Drugs available and Suction available Patient Re-evaluated:Patient Re-evaluated prior to induction Oxygen Delivery Method: Circle system utilized Preoxygenation: Pre-oxygenation with 100% oxygen Induction Type: IV induction Ventilation: Mask ventilation without difficulty LMA: LMA inserted LMA Size: 4.0 Tube type: Oral Number of attempts: 1 Placement Confirmation: positive ETCO2 and breath sounds checked- equal and bilateral Tube secured with: Tape Dental Injury: Teeth and Oropharynx as per pre-operative assessment

## 2023-01-07 NOTE — Telephone Encounter (Signed)
Paperwork was faxed on 01/06/23 to [place] the confirmation came through at 1:59 .

## 2023-01-07 NOTE — H&P (Signed)
History and Physical    Patient: Natasha [name] [id number] DOB: 1946/07/24 DOA: 01/06/2023 DOS: the patient was seen and examined on 01/07/2023 PCP: Tomasita Morrow, NP  Patient coming from: Home  Chief Complaint:  Chief Complaint  Patient presents with   Urinary Retention    HPI: Savilla Lineman Haman is a 77 y.o. female with medical history significant for type 2 diabetes mellitus, COPD on home O2 at 3 L, postherpetic neuralgia, tobacco use, diastolic CHF with recurrent pleural effusion, gastric lymphoma and well as anemia and thrombocytopenia, hospitalized from 2/20 to 12/17/2022 with left leg cellulitis who presents to the emergency room with complaint of urinary frequency and dysuria and a sensation of incomplete bladder emptying.  At the same time she complains of left knee pain and swelling, denying any recent injuries though she does have a known history of osteoarthritis.  She denied fever or chills, nausea or vomiting or diarrhea or abdominal pain. ED course and data review: Vitals within normal limits.  Labs notable for leukopenia of 1700 and anemia of 9.1, procalcitonin of 0.21 and urinalysis consistent with UTI.  BNP was 182. X-ray of the knee showed a large left knee effusion Arthrocentesis was done which showed 1534 WBCs with 82 neutrophils The ED provider consulted orthopedist, Dr. Roland Rack who recommended admission to medicine and will see in the a.m. For her urinary retention, Foley catheter was placed with return of 500 mL urine. Patient was started on ceftriaxone and vancomycin for treatment of possible septic joint as well as UTI.  Hospitalist consulted for admission.   Review of Systems: As mentioned in the history of present illness. All other systems reviewed and are negative.  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 Her smoking use included cigarettes. She started smoking about 61 years ago. She has a 110.00 pack-year smoking history. She has never used smokeless tobacco. She reports that she does not drink alcohol and does not use drugs.  No Known Allergies  Family History  Problem Relation Age of Onset   Cancer Mother    Breast cancer Mother    Cancer Father    Alzheimer's disease Father    Lung cancer Father        lung   Varicose Veins Brother    Heart attack Brother     Prior to Admission medications   Medication Sig Start Date End Date Taking? Authorizing Provider  cephALEXin (KEFLEX) 500 MG capsule Take 1 capsule (500 mg total) by mouth 4 (four) times daily for 10 days. 01/06/23 01/16/23 Yes Nance Pear, MD  Accu-Chek Softclix Lancets lancets 1 each 3 (three) times daily. 12/21/22   [provider]  acyclovir (ZOVIRAX) 400 MG tablet Take 400 mg by mouth daily. 08/09/22   [provider]  albuterol (VENTOLIN HFA) 108 (90 Base) MCG/ACT inhaler TAKE 2 PUFFS  BY MOUTH EVERY 6 HOURS AS NEEDED FOR WHEEZE OR SHORTNESS OF BREATH 12/21/22   [name], NP  apixaban (ELIQUIS) 2.5 MG TABS tablet Take 1 tablet (2.5 mg total) by mouth 2 (two) times daily. 12/29/22   Sindy Guadeloupe, MD  Blood Glucose Monitoring Suppl DEVI 1 each by Does not apply route in the morning, at noon, and at bedtime. May substitute to any manufacturer covered by patient's insurance. 12/21/22   [name], NP  clobetasol cream (TEMOVATE) [id number] % Apply 1 Application topically daily. 12/21/22   [name], NP  furosemide (LASIX) 20 MG tablet Take  1 tablet (20 mg total) by mouth daily. 09/09/22   Darylene Price A, FNP  Glucose Blood (BLOOD GLUCOSE TEST STRIPS) STRP 1 each by In Vitro route in the morning, at noon, and at bedtime. May substitute to any manufacturer covered by patient's insurance. 12/21/22 01/20/23  [name], NP  guaiFENesin-dextromethorphan (ROBITUSSIN DM) 100-10 MG/5ML syrup Take 5 mLs by mouth every 4 (four) hours as needed for cough. 12/17/22   Lorella Nimrod, MD  insulin NPH-regular Human (70-30) 100 UNIT/ML injection Inject 4 Units into the skin 2 (two) times daily with a meal. Use only for hyperglycemia, CBG+ 300. Start with 5 units, titrate up to max 10 unit per dose if need. 01/06/23   Tomasita Morrow, NP  ipratropium-albuterol (DUONEB) 0.5-2.5 (3) MG/3ML SOLN Take 3 mLs by nebulization every 6 (six) hours as needed (shortness of breath or wheezing). 12/21/22   [name], NP  Lancet Device MISC 1 each by Does not apply route in the morning, at noon, and at bedtime. May substitute to any manufacturer covered by patient's insurance. 12/21/22 01/20/23  [name], NP  Lancets Misc. MISC 1 each by Does not apply route in the morning, at noon, and at bedtime. May substitute to any manufacturer covered by patient's insurance. 12/21/22 01/20/23  [name], NP  lenalidomide (REVLIMID) 5 MG capsule Take 1 capsule (5 mg total) by mouth daily. Take for 21 days, then hold for 7 days. Repeat every 28 days. 12/22/22   Sindy Guadeloupe, MD  levothyroxine (SYNTHROID) 50 MCG tablet Take 1 tablet (50 mcg total) by mouth daily at 6 (six) AM. 07/17/22   Sharen Hones, MD  loperamide (IMODIUM) 2 MG capsule Take 2 mg by mouth as needed for diarrhea or loose stools.    [provider]  midodrine (PROAMATINE) 10 MG tablet Take 1 tablet (10 mg total) by mouth 3 (three) times daily with meals. 10/28/22   Jennye Boroughs, MD  nystatin-triamcinolone ointment [facility]) Apply 1 Application topically 2 (two) times daily. 12/21/22   [name], NP  ondansetron (ZOFRAN) 8 MG tablet Take 8 mg by mouth every 8 (eight) hours as needed for nausea, vomiting or refractory nausea / vomiting. Take 1 tablet q6H for 4 days after chemotherapy. 08/09/22   [provider]  pantoprazole (PROTONIX) 40 MG tablet Take 1 tablet (40 mg total) by mouth daily. 11/12/22 12/14/22  Loletha Grayer, MD  prochlorperazine (COMPAZINE) 10 MG tablet Take 1 tablet by mouth every 6 (six) hours as needed for nausea or vomiting. Take 1 tab q6H for 4 days after chemotherapy. 08/09/22   [provider]  simethicone (MYLICON) 80 MG chewable tablet Chew 180 mg by mouth every 6 (six) hours as needed for flatulence. Take 1 capsule PO PRN gas relief.    [provider]  Tenofovir Alafenamide Fumarate (VEMLIDY) 25 MG TABS Take 1 tablet (25 mg  total) by mouth daily. 11/26/22   Sindy Guadeloupe, MD  traMADol (ULTRAM) 50 MG tablet Take 1 tablet (50 mg total) by mouth every 6 (six) hours as needed for moderate pain or severe pain. 12/17/22   Lorella Nimrod, MD    Physical Exam: Vitals:   01/07/23 0130 01/07/23 0200 01/07/23 0230 01/07/23 0300  BP: 128/65 130/66 130/69 123/60  Pulse: 92 92 93 95  Resp:      Temp:    98.5 F (36.9 C)  TempSrc:    Oral  SpO2: 95% 100% 99% 97%  Weight:      Height:       Physical Exam Vitals and nursing note reviewed.  Constitutional:      General: She is not in acute distress. HENT:     Head: Normocephalic and atraumatic.  Cardiovascular:     Rate and Rhythm: Normal rate and regular rhythm.     Heart sounds: Normal heart sounds.  Pulmonary:     Effort: Pulmonary effort is normal.     Breath sounds: Normal breath sounds.  Abdominal:     Palpations: Abdomen is soft.     Tenderness: There is no abdominal tenderness.  Neurological:     Mental Status: Mental status is at baseline.     Labs on Admission: I have personally reviewed following labs and imaging studies  CBC: Recent Labs  Lab  01/06/23 1920  WBC 1.7*  NEUTROABS 0.3*  HGB 9.1*  HCT 30.4*  MCV 89.9  PLT 0000000   Basic Metabolic Panel: Recent Labs  Lab 01/06/23 1920  NA 140  K 4.0  CL 99  CO2 34*  GLUCOSE 106*  BUN 24*  CREATININE 0.80  CALCIUM 8.5*   GFR: Estimated Creatinine Clearance: 54.7 mL/min (by C-G formula based on SCr of 0.8 mg/dL). Liver Function Tests: Recent Labs  Lab 01/06/23 1920  AST 13*  ALT 11  ALKPHOS 84  BILITOT 1.0  PROT 5.2*  ALBUMIN 2.9*   No results for input(s): "LIPASE", "AMYLASE" in the last 168 hours. No results for input(s): "AMMONIA" in the last 168 hours. Coagulation Profile: No results for input(s): "INR", "PROTIME" in the last 168 hours. Cardiac Enzymes: No results for input(s): "CKTOTAL", "CKMB", "CKMBINDEX", "TROPONINI" in the last 168 hours. BNP (last 3 results) No results for input(s): "PROBNP" in the last 8760 hours. HbA1C: No results for input(s): "HGBA1C" in the last 72 hours. CBG: No results for input(s): "GLUCAP" in the last 168 hours. Lipid Profile: No results for input(s): "CHOL", "HDL", "LDLCALC", "TRIG", "CHOLHDL", "LDLDIRECT" in the last 72 hours. Thyroid Function Tests: No results for input(s): "TSH", "T4TOTAL", "FREET4", "T3FREE", "THYROIDAB" in the last 72 hours. Anemia Panel: No results for input(s): "VITAMINB12", "FOLATE", "FERRITIN", "TIBC", "IRON", "RETICCTPCT" in the last 72 hours. Urine analysis:    Component Value Date/Time   COLORURINE YELLOW (A) 01/06/2023 2112   APPEARANCEUR HAZY (A) 01/06/2023 2112   LABSPEC 1.013 01/06/2023 2112   PHURINE 5.0 01/06/2023 2112   GLUCOSEU NEGATIVE 01/06/2023 2112   HGBUR SMALL (A) 01/06/2023 2112   BILIRUBINUR NEGATIVE 01/06/2023 2112   [place] NEGATIVE 01/06/2023 2112   PROTEINUR 30 (A) 01/06/2023 2112   NITRITE POSITIVE (A) 01/06/2023 2112   LEUKOCYTESUR LARGE (A) 01/06/2023 2112    Radiological Exams on Admission: DG Knee Complete 4 Views Left  Result Date: 01/06/2023 CLINICAL  DATA:  Pain and swelling of left knee EXAM: LEFT KNEE - COMPLETE 4+ VIEW COMPARISON:  12/14/2022 FINDINGS: Frontal, bilateral oblique, lateral  views of the left knee are obtained. No acute fracture, subluxation, or dislocation. Interval development of large left knee effusion. Stable 3 compartmental osteoarthritis. There is progressive diffuse subcutaneous edema surrounding the left knee and throughout the visualized left lower extremity. IMPRESSION: 1. Interval development of large left knee effusion. 2. Diffuse subcutaneous edema throughout the visualized left leg, which has progressed since prior study. 3. No acute bony abnormality. 4. Stable 3 compartmental osteoarthritis. Electronically Signed   By: Randa Ngo M.D.   On: 01/06/2023 22:32     Data Reviewed: Relevant notes from primary care and specialist visits, past discharge summaries as available in EHR, including Care Everywhere. Prior diagnostic testing as pertinent to current admission diagnoses Updated medications and problem lists for reconciliation ED course, including vitals, labs, imaging, treatment and response to treatment Triage notes, nursing and pharmacy notes and ED provider's notes Notable results as noted in HPI   Assessment and Plan: Effusion, left knee Possible septic joint History of osteoarthritis both knees Continue ceftriaxone and vancomycin Orthopedic consult Pain control N.p.o. in case of need for procedure Hold Eliquis, seen on med list Follow synovial fluid Gram stain and culture  Urinary tract infection Acute urinary retention Foley catheter was placed in the ED Voiding trial in the a.m. Rocephin for UTI Follow cultures  Type 2 diabetes mellitus without complication, with long-term current use of insulin (HCC) Sliding scale insulin coverage  Anemia of chronic disease Leukopenia At baseline  Nodal marginal zone B-cell lymphoma (HCC) Followed by Dr. Janese Banks  Chronic obstructive pulmonary disease  (COPD) ([place]) Chronic respiratory failure with hypoxia Not acutely exacerbated Continue home inhalers with DuoNebs as needed Continue supplemental oxygen        DVT prophylaxis: SCDs  Consults: Orthopedics, Dr. Roland Rack  Advance Care Planning: full code  Family Communication: none  Disposition Plan: Back to previous home environment  Severity of Illness: The appropriate patient status for this patient is INPATIENT. Inpatient status is judged to be reasonable and necessary in order to provide the required intensity of service to ensure the patient's safety. The patient's presenting symptoms, physical exam findings, and initial radiographic and laboratory data in the context of their chronic comorbidities is felt to place them at high risk for further clinical deterioration. Furthermore, it is not anticipated that the patient will be medically stable for discharge from the hospital within 2 midnights of admission.   * I certify that at the point of admission it is my clinical judgment that the patient will require inpatient hospital care spanning beyond 2 midnights from the point of admission due to high intensity of service, high risk for further deterioration and high frequency of surveillance required.*  Author: Athena Masse, MD 01/07/2023 3:26 AM  For on call review [email or web address].

## 2023-01-07 NOTE — Assessment & Plan Note (Signed)
Sliding scale insulin coverage 

## 2023-01-07 NOTE — Assessment & Plan Note (Addendum)
Possible septic joint History of osteoarthritis both knees Continue ceftriaxone and vancomycin Status post left knee arthroscopy washout by Ortho on 3/15.  Joint fluid sent for diagnostics including cytology Pain control Hold Eliquis, seen on med list Follow synovial fluid Gram stain and culture

## 2023-01-07 NOTE — Consult Note (Signed)
ORTHOPAEDIC CONSULTATION  REQUESTING PHYSICIAN: Max Sane, MD  Chief Complaint:   Left knee pain and swelling.  History of Present Illness: Natasha [name] is a 77 y.o. 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  The patient was in her usual state of poor health yesterday morning when the visiting nurse came by to check her wounds.  The patient states she woke up complaining of increased pain and swelling in her knee.  The nurse was concerned that she may have infection in her knee and advised her to go to the emergency room.  In the emergency room, an aspiration of the knee demonstrated only 1500 white cells with 82% neutrophils and no crystals.  However, her normal white count was only 1700 due to her underlying bone marrow suppression.  The patient notes significant pain with any attempted active or passive motion of the knee.  She is unable to bear weight on the knee.  The patient denies any specific injury to the knee, and denies any fevers or chills.  The patient has been admitted to the medicine service for further evaluation and treatment.  Past Medical History:  Diagnosis Date   Anemia    Cancer ([place])    lymphoma-stomach    Cancer ([place])    leulemia   CHF (congestive heart failure) (HCC)    COPD (chronic obstructive pulmonary disease) (HCC)    Diabetes mellitus without complication (HCC)    Hypotension    Pleural effusion    [facility] 823ml,  2 weeks ago   Vaginal delivery    x 5   Past Surgical History:  Procedure Laterality Date   IR IMAGING GUIDED PORT INSERTION  08/16/2022   TONSILLECTOMY Bilateral    as a child   Social  History   Socioeconomic History   Marital status: Widowed    Spouse name: Not on file   Number of children: 2   Years of education: Not on file   Highest education level: 9th grade  Occupational History   Occupation: unemployed  Tobacco Use   Smoking status: Former    Packs/day: 2.00    Years: 55.00    Additional pack years: 0.00    Total pack years: 110.00    Types: Cigarettes    Start date: 07/07/1961    Quit date: 08/02/2022    Years since quitting: 0.4   Smokeless tobacco: Never   Tobacco comments:    1/2 pack she states but family says 2 PPD  Vaping Use   Vaping Use: Never used  Substance and Sexual Activity   Alcohol use: No   Drug use: Never   Sexual activity: Not Currently    Partners: Male    Birth control/protection: Post-menopausal  Other Topics Concern   Not on file  Social History Narrative   08/14/20   From: [place]   Living: to be near daughter   Work: retired      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      Enjoys: stays at home      Exercise: walking to her daughter's store   Diet: eats fruit, air fried chicken      Safety   Seat belts: Yes  Guns: No   Safe in relationships: Yes    Social Determinants of Health   Financial Resource Strain: Medium Risk (08/24/2022)   Overall Financial Resource Strain (CARDIA)    Difficulty of Paying Living Expenses: Somewhat hard  Food Insecurity: No Food Insecurity (12/15/2022)   Hunger Vital Sign    Worried About Running Out of Food in the Last Year: Never true    Ran Out of Food in the Last Year: Never true  Transportation Needs: Unmet Transportation Needs (12/29/2022)   PRAPARE - Hydrologist (Medical): Yes    Lack of Transportation (Non-Medical): Yes  Physical Activity: Inactive (08/24/2022)   Exercise Vital Sign    Days of Exercise per Week: 0 days    Minutes of Exercise per Session: 0 min  Stress: Stress  Concern Present (08/24/2022)   [place]    Feeling of Stress : To some extent  Social Connections: Socially Isolated (08/24/2022)   Social Connection and Isolation Panel [NHANES]    Frequency of Communication with Friends and Family: More than three times a week    Frequency of Social Gatherings with Friends and Family: More than three times a week    Attends Religious Services: Never    Marine scientist or Organizations: No    Attends Archivist Meetings: Never    Marital Status: Widowed   Family History  Problem Relation Age of Onset   Cancer Mother    Breast cancer Mother    Cancer Father    Alzheimer's disease Father    Lung cancer Father        lung   Varicose Veins Brother    Heart attack Brother    No Known Allergies Prior to Admission medications   Medication Sig Start Date End Date Taking? Authorizing Provider  cephALEXin (KEFLEX) 500 MG capsule Take 1 capsule (500 mg total) by mouth 4 (four) times daily for 10 days. 01/06/23 01/16/23 Yes Nance Pear, MD  Accu-Chek Softclix Lancets lancets 1 each 3 (three) times daily. 12/21/22   [provider]  acyclovir (ZOVIRAX) 400 MG tablet Take 400 mg by mouth daily. 08/09/22   [provider]  albuterol (VENTOLIN HFA) 108 (90 Base) MCG/ACT inhaler TAKE 2 PUFFS BY MOUTH EVERY 6 HOURS AS NEEDED FOR WHEEZE OR SHORTNESS OF BREATH 12/21/22   [name], NP  apixaban (ELIQUIS) 2.5 MG TABS tablet Take 1 tablet (2.5 mg total) by mouth 2 (two) times daily. 12/29/22   Sindy Guadeloupe, MD  Blood Glucose Monitoring Suppl DEVI 1 each by Does not apply route in the morning, at noon, and at bedtime. May substitute to any manufacturer covered by patient's insurance. 12/21/22   [name], NP  clobetasol cream (TEMOVATE) [id number] % Apply 1 Application topically daily. 12/21/22   [name], NP  furosemide (LASIX) 20 MG tablet Take 1  tablet (20 mg total) by mouth daily. 09/09/22   Darylene Price A, FNP  Glucose Blood (BLOOD GLUCOSE TEST STRIPS) STRP 1 each by In Vitro route in the morning, at noon, and at bedtime. May substitute to any manufacturer covered by patient's insurance. 12/21/22 01/20/23  [name], NP  guaiFENesin-dextromethorphan (ROBITUSSIN DM) 100-10 MG/5ML syrup Take 5 mLs by mouth every 4 (four) hours as needed for cough. 12/17/22   Lorella Nimrod, MD  insulin NPH-regular Human (70-30) 100 UNIT/ML injection Inject 4 Units into the skin 2 (two) times daily with a  meal. Use only for hyperglycemia, CBG+ 300. Start with 5 units, titrate up to max 10 unit per dose if need. 01/06/23   Tomasita Morrow, NP  ipratropium-albuterol (DUONEB) 0.5-2.5 (3) MG/3ML SOLN Take 3 mLs by nebulization every 6 (six) hours as needed (shortness of breath or wheezing). 12/21/22   [name], NP  Lancet Device MISC 1 each by Does not apply route in the morning, at noon, and at bedtime. May substitute to any manufacturer covered by patient's insurance. 12/21/22 01/20/23  [name], NP  Lancets Misc. MISC 1 each by Does not apply route in the morning, at noon, and at bedtime. May substitute to any manufacturer covered by patient's insurance. 12/21/22 01/20/23  [name], NP  lenalidomide (REVLIMID) 5 MG capsule Take 1 capsule (5 mg total) by mouth daily. Take for 21 days, then hold for 7 days. Repeat every 28 days. 12/22/22   Sindy Guadeloupe, MD  levothyroxine (SYNTHROID) 50 MCG tablet Take 1 tablet (50 mcg total) by mouth daily at 6 (six) AM. 07/17/22   Sharen Hones, MD  loperamide (IMODIUM) 2 MG capsule Take 2 mg by mouth as needed for diarrhea or loose stools.    [provider]  midodrine (PROAMATINE) 10 MG tablet Take 1 tablet (10 mg total) by mouth 3 (three) times daily with meals. 10/28/22   Jennye Boroughs, MD  nystatin-triamcinolone ointment [facility]) Apply 1 Application topically 2 (two) times daily. 12/21/22   [name], NP  ondansetron (ZOFRAN) 8 MG tablet Take 8 mg by mouth every 8 (eight) hours as needed for nausea, vomiting or refractory nausea / vomiting. Take 1 tablet q6H for 4 days after chemotherapy. 08/09/22   [provider]  pantoprazole (PROTONIX) 40 MG tablet Take 1 tablet (40 mg total) by mouth daily. 11/12/22 12/14/22  Loletha Grayer, MD  prochlorperazine (COMPAZINE) 10 MG tablet Take 1 tablet by mouth every 6 (six) hours as needed for nausea or vomiting. Take 1 tab q6H for 4 days after chemotherapy. 08/09/22   [provider]  simethicone (MYLICON) 80 MG chewable tablet Chew 180 mg by mouth every 6 (six) hours as needed for flatulence. Take 1 capsule PO PRN gas relief.    [provider]  Tenofovir Alafenamide Fumarate (VEMLIDY) 25 MG TABS Take 1 tablet (25 mg total) by mouth daily. 11/26/22   Sindy Guadeloupe, MD  traMADol (ULTRAM) 50 MG tablet Take 1 tablet (50 mg total) by mouth every 6 (six) hours as needed for moderate pain or severe pain. 12/17/22   Lorella Nimrod, MD   DG Knee Complete 4 Views Left  Result Date: 01/06/2023 CLINICAL DATA:  Pain and swelling of left knee EXAM: LEFT KNEE - COMPLETE 4+ VIEW COMPARISON:  12/14/2022 FINDINGS: Frontal, bilateral oblique, lateral views of the left knee are obtained. No acute fracture, subluxation, or dislocation. Interval development of large left knee effusion. Stable 3 compartmental osteoarthritis. There is progressive diffuse subcutaneous edema surrounding the left knee and throughout the visualized left lower extremity. IMPRESSION: 1. Interval development of large left knee effusion. 2. Diffuse subcutaneous edema throughout the visualized left leg, which has progressed since prior study. 3. No acute bony abnormality. 4. Stable 3 compartmental osteoarthritis. Electronically Signed   By: Randa Ngo M.D.   On: 01/06/2023 22:32    Positive ROS: All other systems have been reviewed and were otherwise negative with the  exception of those mentioned in the HPI and as above.  Physical Exam: General:  Alert, no acute  distress Psychiatric:  Patient is competent for consent with normal mood and affect   Cardiovascular:  No pedal edema Respiratory:  No wheezing, non-labored breathing GI:  Abdomen is soft and non-tender Skin:  No lesions in the area of chief complaint Neurologic:  Sensation intact distally Lymphatic:  No axillary or cervical lymphadenopathy  Orthopedic Exam:  Orthopedic examination is limited for the left knee and lower extremity.  Skin inspection around the left knee is notable for moderate swelling as well as a 2+ effusion.  No erythema, ecchymosis, abrasions, or other skin abnormalities are identified.  She has significant pain even gentle palpation over the medial and lateral aspects of the knee, as well as anteriorly.  She keeps her knee in about 30 to 40 degrees of flexion and has significant pain with any attempted active or passive flexion or extension of the knee.  The left lower extremity is notable for moderate swelling as well as for chronic venous stasis skin changes.  There is an Ace wrap dressing over the lower leg which was not taken down.  She is able to dorsiflex and plantarflex her toes and ankle.  She has fair capillary refill to her toes.  X-rays:  Recent AP, lateral, and oblique views of the right knee are available for review and have been reviewed by myself.  These films demonstrate moderate degenerative changes, primarily involving the patellofemoral compartment as manifested by sclerosis of the patellar surface and osteophyte formation, best seen on the lateral view.  Lesser degenerative changes of the medial compartment also noted.  There is a large effusion noted, again best seen on the lateral view.  No lytic lesions or acute bony abnormalities are identified.  Assessment: Sudden onset of increased left knee pain and swelling concerning for septic knee.  Plan: The  treatment options have been discussed with the patient.  Although the patient is afebrile, she is unable to mount a strong immune response due to her underlying bone marrow suppressive issues.  Given her physical examination findings, I am concerned that she does indeed have a septic knee.  Therefore, I have recommended that she undergo an arthroscopic irrigation and debridement of the left knee.  The patient is agreeable to this plan.  The risks of this procedure (including bleeding, infection, nerve and/or blood vessel injury, persistent or recurrent pain, persistent or recurrent infection, need for further surgery, blood clots, strokes, heart attacks or arrhythmias, pneumonia, etc.) and benefits of the surgical procedure were discussed. The patient states her understanding and agrees to proceed.  A formal written consent will be obtained by the nursing staff.  Thank you for asking me to participate in the care of this most unfortunate woman.  I will be happy to follow her with you.   Pascal Lux, MD  Beeper #:  [phone]  01/07/2023 7:40 AM

## 2023-01-08 ENCOUNTER — Inpatient Hospital Stay: Payer: 59

## 2023-01-08 DIAGNOSIS — Z794 Long term (current) use of insulin: Secondary | ICD-10-CM

## 2023-01-08 DIAGNOSIS — D61818 Other pancytopenia: Secondary | ICD-10-CM

## 2023-01-08 DIAGNOSIS — E119 Type 2 diabetes mellitus without complications: Secondary | ICD-10-CM | POA: Diagnosis not present

## 2023-01-08 DIAGNOSIS — M25462 Effusion, left knee: Secondary | ICD-10-CM | POA: Diagnosis not present

## 2023-01-08 DIAGNOSIS — R339 Retention of urine, unspecified: Secondary | ICD-10-CM | POA: Diagnosis not present

## 2023-01-08 LAB — BASIC METABOLIC PANEL
Anion gap: 11 (ref 5–15)
BUN: 36 mg/dL — ABNORMAL HIGH (ref 8–23)
CO2: 30 mmol/L (ref 22–32)
Calcium: 8.1 mg/dL — ABNORMAL LOW (ref 8.9–10.3)
Chloride: 97 mmol/L — ABNORMAL LOW (ref 98–111)
Creatinine, Ser: 1.03 mg/dL — ABNORMAL HIGH (ref 0.44–1.00)
GFR, Estimated: 57 mL/min — ABNORMAL LOW (ref >60)
Glucose, Bld: 294 mg/dL — ABNORMAL HIGH (ref 70–99)
Potassium: 3.8 mmol/L (ref 3.5–5.1)
Sodium: 138 mmol/L (ref 135–145)

## 2023-01-08 LAB — CBC
HCT: 25.9 % — ABNORMAL LOW (ref 36.0–46.0)
Hemoglobin: 7.7 g/dL — ABNORMAL LOW (ref 12.0–15.0)
MCH: 26.4 pg (ref 26.0–34.0)
MCHC: 29.7 g/dL — ABNORMAL LOW (ref 30.0–36.0)
MCV: 88.7 fL (ref 80.0–100.0)
Platelets: 137 10*3/uL — ABNORMAL LOW (ref 150–400)
RBC: 2.92 MIL/uL — ABNORMAL LOW (ref 3.87–5.11)
RDW: 15.1 % (ref 11.5–15.5)
WBC: 0.4 10*3/uL — CL (ref 4.0–10.5)
nRBC: 0 % (ref 0.0–0.2)

## 2023-01-08 LAB — GLUCOSE, CAPILLARY
Glucose-Capillary: 148 mg/dL — ABNORMAL HIGH (ref 70–99)
Glucose-Capillary: 200 mg/dL — ABNORMAL HIGH (ref 70–99)
Glucose-Capillary: 212 mg/dL — ABNORMAL HIGH (ref 70–99)
Glucose-Capillary: 225 mg/dL — ABNORMAL HIGH (ref 70–99)
Glucose-Capillary: 258 mg/dL — ABNORMAL HIGH (ref 70–99)
Glucose-Capillary: 268 mg/dL — ABNORMAL HIGH (ref 70–99)
Glucose-Capillary: 295 mg/dL — ABNORMAL HIGH (ref 70–99)

## 2023-01-08 LAB — PROTIME-INR
INR: 1.4 — ABNORMAL HIGH (ref 0.8–1.2)
Prothrombin Time: 16.8 seconds — ABNORMAL HIGH (ref 11.4–15.2)

## 2023-01-08 LAB — PREPARE RBC (CROSSMATCH)

## 2023-01-08 MED ORDER — IOHEXOL 9 MG/ML PO SOLN
500.0000 mL | ORAL | Status: DC
  Administered 2023-01-08: 500 mL via ORAL

## 2023-01-08 MED ORDER — IOHEXOL 300 MG/ML  SOLN
100.0000 mL | Freq: Once | INTRAMUSCULAR | Status: AC | PRN
  Administered 2023-01-08: 100 mL via INTRAVENOUS

## 2023-01-08 MED ORDER — ALUM & MAG HYDROXIDE-SIMETH 200-200-20 MG/5ML PO SUSP
30.0000 mL | Freq: Four times a day (QID) | ORAL | Status: DC | PRN
  Administered 2023-01-08: 30 mL via ORAL
  Filled 2023-01-08: qty 30

## 2023-01-08 MED ORDER — SODIUM CHLORIDE 0.9% IV SOLUTION: Freq: Once | INTRAVENOUS | Status: AC

## 2023-01-08 MED ORDER — IPRATROPIUM-ALBUTEROL 0.5-2.5 (3) MG/3ML IN SOLN
3.0000 mL | Freq: Four times a day (QID) | RESPIRATORY_TRACT | Status: DC
  Administered 2023-01-08 (×3): 3 mL via RESPIRATORY_TRACT
  Filled 2023-01-08 (×3): qty 3

## 2023-01-08 MED ORDER — METRONIDAZOLE 500 MG/100ML IV SOLN
500.0000 mg | Freq: Three times a day (TID) | INTRAVENOUS | Status: DC
  Administered 2023-01-08 – 2023-01-10 (×5): 500 mg via INTRAVENOUS
  Filled 2023-01-08 (×6): qty 100

## 2023-01-08 MED ORDER — IOHEXOL 9 MG/ML PO SOLN
500.0000 mL | ORAL | Status: AC
  Administered 2023-01-08: 500 mL via ORAL

## 2023-01-08 MED ORDER — CHLORHEXIDINE GLUCONATE CLOTH 2 % EX PADS
6.0000 | MEDICATED_PAD | Freq: Every day | CUTANEOUS | Status: DC
  Administered 2023-01-08 – 2023-01-14 (×7): 6 via TOPICAL

## 2023-01-08 NOTE — Assessment & Plan Note (Signed)
Tmax of 101.  WBC of 0.4-will consult oncology

## 2023-01-08 NOTE — Final Progress Note (Addendum)
Later this am, Patient started having lot of stomach pain and distended abd per nursing. I had ordered mylanta and CT abd-pelvis for further eval.  CT resulted later this evening/night, showed findings consistent with cecal volvulus - d/w Dr Dahlia Byes who will see her as new c/s and d/w daughter to decide surgery plans.  She is high risk for surgery and certainly at risk for dying. Daughter had agreed to keep her comfortable and hospice at D/C. Will let Dr Dahlia Byes d/w her to decide surgery plans.

## 2023-01-08 NOTE — Assessment & Plan Note (Addendum)
Plan for hospice at home upon discharge.  Patient has 9 readmissions in the last 6 months and 2 ED visits per [facility] record

## 2023-01-08 NOTE — Progress Notes (Addendum)
[facility]) Hospice hospital liaison note  Referral received from [facility] for patient for hospice services at home after discharge. Chart currently under review.   Talked with patient at bedside and daughter by phone to confirm referral, explain services and hospice philosophy of care.   At this time current plan is for discharge Monday or Tuesday and there are no immediate DME needs.   Contact information provided to patient's daughter [name].   Liaison will continue to follow throughout hospitalization. Please don't hesitate to call with any hospice related questions or concerns.   Thank you for the opportunity to participate in this patient's care.   Jhonnie Garner, BSN, RN [facility] hospital liaison [phone]

## 2023-01-08 NOTE — Evaluation (Signed)
Physical Therapy Evaluation Patient Details Name: Natasha [name] MRN: [id number] DOB: 31-Jan-1946 Today's Date: 01/08/2023  History of Present Illness  77 y.o. female with medical history significant for type 2 diabetes mellitus, COPD on home O2 at 3 L, postherpetic neuralgia, tobacco use, diastolic CHF with recurrent pleural effusion, gastric lymphoma and well as anemia and thrombocytopenia, hospitalized from 2/20 to 12/17/2022 with left leg cellulitis who presents to the emergency room with complaint of urinary frequency and dysuria and a sensation of incomplete bladder emptying.  At the same time she complains of left knee pain and swelling   01/07/23 Left knee arthroscopic washout by Ortho, ID consult for suspected septic knee, Foley placed in the ED for acute urinary retention.   Clinical Impression  Patient received in bed, she is agreeable to PT assessment. Patient is mod I for bed mobility with increased time and use of bed rails. Patient is able to stand with min A from slightly elevated surface. Patient is able to take a couple of steps from bed to recliner. Pain limited. She will continue to benefit from skilled PT to improve functional independence and safety.           Recommendations for follow up therapy are one component of a multi-disciplinary discharge planning process, led by the attending physician.  Recommendations may be updated based on patient status, additional functional criteria and insurance authorization.  Follow Up Recommendations Skilled nursing-short term rehab (<3 hours/day) Can patient physically be transported by private vehicle: No    Assistance Recommended at Discharge Frequent or constant Supervision/Assistance  Patient can return home with the following  A lot of help with walking and/or transfers;A lot of help with bathing/dressing/bathroom;Assist for transportation    Equipment Recommendations None recommended by PT  Recommendations for Other Services        Functional Status Assessment Patient has had a recent decline in their functional status and demonstrates the ability to make significant improvements in function in a reasonable and predictable amount of time.     Precautions / Restrictions Precautions Precautions: Fall Restrictions Weight Bearing Restrictions: Yes LLE Weight Bearing: Weight bearing as tolerated      Mobility  Bed Mobility Overal bed mobility: Modified Independent Bed Mobility: Supine to Sit     Supine to sit: Supervision     General bed mobility comments: increased time to get to edge of bed    Transfers Overall transfer level: Needs assistance Equipment used: Rolling walker (2 wheels) Transfers: Sit to/from Stand, Bed to chair/wheelchair/BSC Sit to Stand: Min assist   Step pivot transfers: Min assist            Ambulation/Gait Ambulation/Gait assistance: Min assist, Mod assist Gait Distance (Feet): 3 Feet Assistive device: Rolling walker (2 wheels) Gait Pattern/deviations: Step-to pattern, Decreased step length - right, Decreased step length - left, Decreased weight shift to left Gait velocity: decreased     General Gait Details: patient pain limited, but able to take a few steps from bed to recliner with min/mod A  Stairs            Wheelchair Mobility    Modified Rankin (Stroke Patients Only)       Balance Overall balance assessment: Needs assistance Sitting-balance support: Feet supported Sitting balance-Leahy Scale: Good     Standing balance support: Bilateral upper extremity supported, During functional activity, Reliant on assistive device for balance Standing balance-Leahy Scale: Fair  Pertinent Vitals/Pain Pain Assessment Pain Assessment: Faces Faces Pain Scale: Hurts whole lot Pain Location: L knee Pain Descriptors / Indicators: Discomfort, Grimacing, Guarding, Moaning, Sore Pain Intervention(s): Monitored during  session, Repositioned    Home Living Family/patient expects to be discharged to:: Private residence Living Arrangements: Other relatives Available Help at Discharge: Family;Available PRN/intermittently Type of Home: House Home Access: Stairs to enter Entrance Stairs-Rails: None Entrance Stairs-Number of Steps: 2   Home Layout: One level Home Equipment: Conservation officer, nature (2 wheels);Cane - single point;Hospital bed;BSC/3in1 Additional Comments: Chronic O2 use    Prior Function Prior Level of Function : Needs assist;History of Falls (last six months)             Mobility Comments: assistance required on the stairs with difficulty reported due to fatigue. limited ambulation at baseline with rolling walker ADLs Comments: needs assist for ADLs     Hand Dominance   Dominant Hand: Left    Extremity/Trunk Assessment   Upper Extremity Assessment Upper Extremity Assessment: Overall WFL for tasks assessed    Lower Extremity Assessment Lower Extremity Assessment: LLE deficits/detail LLE: Unable to fully assess due to pain LLE Coordination: decreased gross motor    Cervical / Trunk Assessment Cervical / Trunk Assessment: Normal  Communication   Communication: HOH  Cognition Arousal/Alertness: Awake/alert Behavior During Therapy: WFL for tasks assessed/performed Overall Cognitive Status: No family/caregiver present to determine baseline cognitive functioning                                          General Comments      Exercises     Assessment/Plan    PT Assessment Patient needs continued PT services  PT Problem List Decreased strength;Decreased activity tolerance;Decreased balance;Decreased mobility;Decreased knowledge of precautions;Decreased safety awareness;Decreased knowledge of use of DME;Pain       PT Treatment Interventions DME instruction;Gait training;Stair training;Functional mobility training;Therapeutic exercise;Therapeutic  activities;Balance training;Neuromuscular re-education;Cognitive remediation;Patient/family education    PT Goals (Current goals can be found in the Care Plan section)  Acute Rehab PT Goals Patient Stated Goal: to decrease pain PT Goal Formulation: With patient Time For Goal Achievement: 01/22/23 Potential to Achieve Goals: Good    Frequency Min 2X/week     Co-evaluation               AM-PAC PT "6 Clicks" Mobility  Outcome Measure Help needed turning from your back to your side while in a flat bed without using bedrails?: A Little Help needed moving from lying on your back to sitting on the side of a flat bed without using bedrails?: A Little Help needed moving to and from a bed to a chair (including a wheelchair)?: A Little Help needed standing up from a chair using your arms (e.g., wheelchair or bedside chair)?: A Little Help needed to walk in hospital room?: A Lot Help needed climbing 3-5 steps with a railing? : Total 6 Click Score: 15    End of Session Equipment Utilized During Treatment: Oxygen Activity Tolerance: Patient limited by pain Patient left: in chair;with call bell/phone within reach;with bed alarm set Nurse Communication: Mobility status PT Visit Diagnosis: Muscle weakness (generalized) (M62.81);Pain;Difficulty in walking, not elsewhere classified (R26.2);Other abnormalities of gait and mobility (R26.89) Pain - Right/Left: Left Pain - part of body: Knee    Time: 1147-1209 PT Time Calculation (min) (ACUTE ONLY): 22 min   Charges:   PT Evaluation $  PT Eval Moderate Complexity: 1 Mod PT Treatments $Therapeutic Activity: 8-22 mins        Amrit Cress, PT, GCS 01/08/23,12:50 PM

## 2023-01-08 NOTE — Plan of Care (Signed)
  Problem: Clinical Measurements: Goal: Ability to avoid or minimize complications of infection will improve Outcome: Progressing   Problem: Skin Integrity: Goal: Skin integrity will improve Outcome: Progressing   Problem: Education: Goal: Knowledge of General Education information will improve Description: Including pain rating scale, medication(s)/side effects and non-pharmacologic comfort measures Outcome: Progressing   Problem: Health Behavior/Discharge Planning: Goal: Ability to manage health-related needs will improve Outcome: Progressing   Problem: Activity: Goal: Risk for activity intolerance will decrease Outcome: Progressing

## 2023-01-08 NOTE — TOC Progression Note (Signed)
Transition of Care [facility]) - Progression Note    Patient Details  Name: Shawntay Ketcham Marsiglia MRN: [id number] Date of Birth: Jul 26, 1946  Transition [facility]) CM/SW Contact  Izola Price, RN Phone Number: 01/08/2023, 4:24 PM  Clinical Narrative:  3/16: Plan is to discharge home with hospice Monday or Tuesday per hospice liaison progress note. Simmie Davies RN CM      Expected Discharge Plan: Home w Hospice Care Barriers to Discharge: Continued Medical Work up  Expected Discharge Plan and Services       Living arrangements for the past 2 months: Single Family Home                                       Social Determinants of Health (SDOH) Interventions [place]: No Food Insecurity (01/07/2023)  Housing: Low Risk  (01/07/2023)  Transportation Needs: Unmet Transportation Needs (01/07/2023)  Utilities: Not At Risk (01/07/2023)  Alcohol Screen: Low Risk  (08/24/2022)  Depression (PHQ2-9): Low Risk  (08/24/2022)  Financial Resource Strain: Medium Risk (08/24/2022)  Physical Activity: Inactive (08/24/2022)  Social Connections: Socially Isolated (08/24/2022)  Stress: Stress Concern Present (08/24/2022)  Tobacco Use: Medium Risk (01/06/2023)    Readmission Risk Interventions    11/08/2022   10:53 AM 10/21/2022    1:13 PM 08/17/2022   11:05 AM  Readmission Risk Prevention Plan  Transportation Screening Complete Complete Complete  PCP or Specialist Appt within 3-5 Days   Complete  HRI or [place]   Complete  Social Work Consult for [place] Planning/Counseling   Complete  Palliative Care Screening   Not Applicable  Medication Review Press photographer) Complete Complete Complete  PCP or Specialist appointment within 3-5 days of discharge Complete Complete   HRI or [place] Complete Complete   SW Recovery Care/Counseling Consult Complete Complete   Palliative Care Screening Complete Not [place] Not  Applicable Not Applicable

## 2023-01-08 NOTE — Progress Notes (Addendum)
Subjective: 1 Day Post-Op Procedure(s) (LRB): ARTHROSCOPIC LEFT KNEE WASHOUT (Left) Patient reports pain as moderate.  Does feel that the knee feels better compared to yesterday prior to surgery. Pt and care management to assist with discharge planning. Negative for chest pain and shortness of breath Fever: Recent temp of 101. Gastrointestinal:Negative for nausea and vomiting, does report some abdominal pain today.  Objective: Vital signs in last 24 hours: Temp:  [97.9 F (36.6 C)-101 F (38.3 C)] 98.1 F (36.7 C) (03/16 0828) Pulse Rate:  [80-106] 91 (03/16 0828) Resp:  [15-18] 17 (03/16 0828) BP: (101-127)/(54-65) 103/57 (03/16 0828) SpO2:  [89 %-100 %] 92 % (03/16 0828)  Intake/Output from previous day:  Intake/Output Summary (Last 24 hours) at 01/08/2023 0834 Last data filed at 01/08/2023 0430 Gross per 24 hour  Intake 550 ml  Output 1050 ml  Net -500 ml    Intake/Output this shift: No intake/output data recorded.  Labs: Recent Labs    01/06/23 1920 01/08/23 0342  HGB 9.1* 7.7*   Recent Labs    01/06/23 1920 01/08/23 0342  WBC 1.7* 0.4*  RBC 3.38* 2.92*  HCT 30.4* 25.9*  PLT 179 137*   Recent Labs    01/06/23 1920 01/08/23 0342  NA 140 138  K 4.0 3.8  CL 99 97*  CO2 34* 30  BUN 24* 36*  CREATININE 0.80 1.03*  GLUCOSE 106* 294*  CALCIUM 8.5* 8.1*   No results for input(s): "LABPT", "INR" in the last 72 hours.   EXAM General - Patient is Alert, Appropriate, and Oriented Extremity - ABD soft Neurovascular intact Dorsiflexion/Plantar flexion intact Dressing/Incision - ACE wrap intact to the left knee.  No drainage into the wrap. Hemovac with mild bloody drainage noted, no purulent material ACE wrap intact to the left ankle/foot as well. Motor Function - intact, moving foot and toes well on exam.  Abdomen soft with intact bowel sounds.  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   CHF (congestive heart failure) (HCC)    COPD (chronic obstructive pulmonary disease) (HCC)    Diabetes mellitus without complication (HCC)    Hypotension    Pleural effusion    ARMc 869ml,  2 weeks ago   Vaginal delivery    x 5    Assessment/Plan: 1 Day Post-Op Procedure(s) (LRB): ARTHROSCOPIC LEFT KNEE WASHOUT (Left) Active Problems:   Primary osteoarthritis of both knees   Chronic obstructive pulmonary disease (COPD) (HCC)   Anemia of chronic disease   Goals of care, counseling/discussion   Nodal marginal zone B-cell lymphoma (HCC)   Chronic respiratory failure with hypoxia (HCC)   Type 2 diabetes mellitus without complication, with long-term current use of insulin (HCC)   Urinary retention   Effusion, left knee   Urinary tract infection  Estimated body mass index is 24.13 kg/m as calculated from the following:   Height as of this encounter: 5\' 5"  (1.651 m).   Weight as of this encounter: 65.8 kg. Advance diet Up with therapy D/C IV fluids when tolerating po intake.  Labs reviewed this AM, WBC 0.4 this morning.  Hg 7.7 this morning. Hemovac intact to the left knee, will plan on removing tomorrow. Cultures from aspiration and surgery still pending at this time. Up with therapy today as tolerated. Plan is for discharge home with hospice.  DVT Prophylaxis - TED hose and Eliquis Weight-Bearing as tolerated  to left leg  J. Cameron Proud, PA-C [facility] Orthopaedic Surgery 01/08/2023, 8:34 AM    Addendum: The patient also complains of moderate to severe abdominal pain.  She has a Foley catheter in place.  On examination, the patient demonstrates significant abdominal distention with tympany.  She is diffusely tender across the entire abdomen.  The hospitalist has been made aware and a CT of the abdomen with contrast has been ordered.  Pascal Lux, MD [facility] Orthopaedic Surgery 01/08/2023, 11:34 AM

## 2023-01-08 NOTE — Progress Notes (Signed)
  Progress Note   Patient: Natasha [name] [id number] DOB: 1946/09/08 DOA: 01/06/2023     1 DOS: the patient was seen and examined on 01/08/2023   Brief hospital course: 77 y.o. female with medical history significant for type 2 diabetes mellitus, COPD on home O2 at 3 L, postherpetic neuralgia, tobacco use, diastolic CHF with recurrent pleural effusion, gastric lymphoma and well as anemia and thrombocytopenia, hospitalized from 2/20 to 12/17/2022 with left leg cellulitis who presents to the emergency room with complaint of urinary frequency and dysuria and a sensation of incomplete bladder emptying.  At the same time she complains of left knee pain and swelling   3/15: Left knee arthroscopic washout by Ortho, ID consult for suspected septic knee, Foley placed in the ED for acute urinary retention 3/16: US renal pending, plan for hospice at discharge at home, consult oncology for neutropenia  Assessment and Plan: Effusion, left knee Possible septic joint History of osteoarthritis both knees Continue ceftriaxone and vancomycin Status post left knee arthroscopy washout by Ortho on 3/15.  Joint fluid sent for diagnostics including cytology Pain control Hold Eliquis, seen on med list Follow synovial fluid Gram stain and culture  Urinary tract infection Acute urinary retention Foley catheter was placed in the ED Voiding trial in the a.m. checking renal ultrasound Continue Rocephin for UTI Follow cultures  Urinary retention Foley placed in ED, checking renal ultrasound  Type 2 diabetes mellitus without complication, with long-term current use of insulin (HCC) Continue sliding scale insulin coverage  Anemia of chronic disease Pancytopenia, hemoglobin 7.7.  Oncology consult   Neutropenic fever (HCC) Tmax of 101.  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 Janese Banks.  Consult oncology.  Overall poor prognosis daughter in agreement with  keeping her DNR and have hospice follow at discharge  Chronic obstructive pulmonary disease (COPD) ([place]) Chronic respiratory failure with hypoxia Not acutely exacerbated Continue home inhalers with DuoNebs as needed On room air  Pancytopenia ([place]) Due to lymphoma.  Oncology consult  Goals of care, counseling/discussion Plan for hospice at home upon discharge.  Patient has 9 readmissions in the last 6 months and 2 ED visits per [facility] record        Subjective: Patient is feeling somewhat better, knee is still tender although improved from before.  Tmax 101  Physical Exam: Vitals:   01/07/23 2008 01/08/23 0014 01/08/23 0418 01/08/23 0828  BP: (!) 115/58 (!) 113/54 (!) 101/55 (!) 103/57  Pulse: 89 82 85 91  Resp: 18 18 18 17   Temp: 97.9 F (36.6 C) 98.7 F (37.1 C) 98.5 F (36.9 C) 98.1 F (36.7 C)  TempSrc:      SpO2: 95% 94% 95% 92%  Weight:      Height:       77 year old female lying in the bed comfortably without any acute distress Lungs clear to auscultation bilaterally Heart regular rate and rhythm Abdomen soft, benign Left knee:wrapped with ACE - Hemovac with mild bloody drainage noted, no purulent material Extremities: Edema present in both lower extremities Data Reviewed:  Pancytopenia,  Family Communication: None at bedside, daughter was updated over the phone yesterday  Disposition: Status is: Inpatient Remains inpatient appropriate because: Management of pancytopenia and possible sepsis  Planned Discharge Destination: Home   DVT prophylaxis-Eliquis Time spent: 35 minutes  Author: Max Sane, MD 01/08/2023 9:33 AM  For on call review [email or web address].

## 2023-01-08 NOTE — Progress Notes (Signed)
On call provider communication [phone]  S:  Critical White count 0.4  B:  77 y.o. female with a history of nodal marginal  B cell lymphoma, rt pleural effusion due to lymphoma, extensive lymphadenopathy, bone marrow involvement, hemorrhagic shock with PRBC in Jan 2024 presents with difficulty in passing urine- apparently having some trouble for the past week-but she had been to her PCP on 01/05/2023 and did not mention about that.  She had gone that to get her refills on her insulin.  But patient says  her left knee  was very painful when she woke up and was swollen, Between 12/14/2022 until 12/17/2022 for left leg cellulitis and was treated with antibiotics.  Active chemo recipient  A:  Report received from lab 0453  R:  Reported per protocol. Neutropenic Precautions?  0459-Protective precautions ordered

## 2023-01-08 NOTE — Assessment & Plan Note (Signed)
Due to lymphoma.  Oncology consult

## 2023-01-08 NOTE — Assessment & Plan Note (Signed)
Foley placed in ED, checking renal ultrasound

## 2023-01-08 NOTE — Plan of Care (Signed)
  Problem: Skin Integrity: Goal: Skin integrity will improve Outcome: Progressing   

## 2023-01-08 NOTE — Hospital Course (Addendum)
77 y.o. female with medical history significant for type 2 diabetes mellitus, COPD on home O2 at 3 L, postherpetic neuralgia, tobacco use, diastolic CHF with recurrent pleural effusion, gastric lymphoma and well as anemia and thrombocytopenia, hospitalized from 2/20 to 12/17/2022 with left leg cellulitis who presents to the emergency room with complaint of urinary frequency and dysuria and a sensation of incomplete bladder emptying.  At the same time she complains of left knee pain and swelling   3/15: Left knee arthroscopic washout by Ortho, ID consult for suspected septic knee, Foley placed in the ED for acute urinary retention 3/16: US renal pending, plan for hospice at discharge at home, consult oncology for neutropenia 3/17: Had severe abdominal pain later in yesterday, required CT which showed volvulus.  Status post right colectomy, end ileostomy and mucous fistula  3/18: Added Flagyl and diet 3/19: Still having difficulty controlling her pain, WOC nurse planning on training for ileostomy education, not eating much. 3/20: Continue to have abdominal pain.  Ileostomy is are working.  Palliative care was consulted by oncology as they were now recommending hospice care.  3/21: Vitals stable.  Patient will follow-up with orthopedic surgery in 2 weeks for suture removal. Drains were removed by general surgery today.  Patient refused to accept hospice and would like to continue with treatment although not a current candidate for continuation of cancer management.  Not sure whether she really understand her illness and its trajectory. Antibiotics switched with doxycycline for 2 weeks as recommended by ID.  3/22: Patient remained stable.  Wants to go home with home health which were ordered and arranged.  She does not want to go for hospice, again saying that she still has some hope. She will do doxycycline for 13 more days.  Pain medications were also given to be used as needed.  Patient need to have a  close follow-up with orthopedic surgery, general surgery and oncology for further recommendations.  She will continue on current medications and follow-up with her providers for further management.  Patient will remain high risk for deterioration, readmission and mortality based on significant life limiting underlying comorbidities and advanced malignancy.  Oncology recommending hospice but patient is currently refusing and continued to fight although not a candidate for further oncologic treatment.

## 2023-01-08 NOTE — Progress Notes (Signed)
Contacted by DR Manuella Ghazi. CT c/w Volvulus. Pt stage IV lymphoma pancytopenic, she is on eliquis. Has neutropenia fever now w and pain. D/w daughter, she is certainly very high risks for surgical intervention. We will start blood transfusion, antibiotics, keep npo and check coags.  Daughter wants to have a conversation again tomorrow. We will get repeat and xray in am. overall poor prognosis and I am not sure if she would be an operative  candidate.

## 2023-01-09 ENCOUNTER — Encounter: Payer: Self-pay | Admitting: Internal Medicine

## 2023-01-09 ENCOUNTER — Other Ambulatory Visit: Payer: Self-pay

## 2023-01-09 ENCOUNTER — Encounter
Admission: EM | Disposition: A | Discharge status: Home Health | Payer: Self-pay | Source: Home / Self Care | Attending: Internal Medicine

## 2023-01-09 ENCOUNTER — Inpatient Hospital Stay: Payer: 59

## 2023-01-09 ENCOUNTER — Inpatient Hospital Stay: Payer: 59 | Admitting: Anesthesiology

## 2023-01-09 DIAGNOSIS — M25462 Effusion, left knee: Secondary | ICD-10-CM | POA: Diagnosis not present

## 2023-01-09 DIAGNOSIS — C8300 Small cell B-cell lymphoma, unspecified site: Secondary | ICD-10-CM

## 2023-01-09 DIAGNOSIS — D696 Thrombocytopenia, unspecified: Secondary | ICD-10-CM

## 2023-01-09 DIAGNOSIS — K562 Volvulus: Secondary | ICD-10-CM

## 2023-01-09 DIAGNOSIS — R5081 Fever presenting with conditions classified elsewhere: Secondary | ICD-10-CM

## 2023-01-09 DIAGNOSIS — D61818 Other pancytopenia: Secondary | ICD-10-CM | POA: Diagnosis not present

## 2023-01-09 DIAGNOSIS — R339 Retention of urine, unspecified: Secondary | ICD-10-CM | POA: Diagnosis not present

## 2023-01-09 DIAGNOSIS — R531 Weakness: Secondary | ICD-10-CM

## 2023-01-09 DIAGNOSIS — R5383 Other fatigue: Secondary | ICD-10-CM

## 2023-01-09 DIAGNOSIS — C884 Extranodal marginal zone B-cell lymphoma of mucosa-associated lymphoid tissue [MALT-lymphoma]: Secondary | ICD-10-CM | POA: Diagnosis not present

## 2023-01-09 DIAGNOSIS — D709 Neutropenia, unspecified: Secondary | ICD-10-CM | POA: Diagnosis not present

## 2023-01-09 DIAGNOSIS — D649 Anemia, unspecified: Secondary | ICD-10-CM

## 2023-01-09 DIAGNOSIS — R109 Unspecified abdominal pain: Secondary | ICD-10-CM

## 2023-01-09 DIAGNOSIS — M2550 Pain in unspecified joint: Secondary | ICD-10-CM

## 2023-01-09 DIAGNOSIS — R0602 Shortness of breath: Secondary | ICD-10-CM

## 2023-01-09 HISTORY — PX: LAPAROTOMY: SHX154

## 2023-01-09 LAB — GLUCOSE, CAPILLARY
Glucose-Capillary: 89 mg/dL (ref 70–99)
Glucose-Capillary: 100 mg/dL — ABNORMAL HIGH (ref 70–99)
Glucose-Capillary: 91 mg/dL (ref 70–99)
Glucose-Capillary: 186 mg/dL — ABNORMAL HIGH (ref 70–99)

## 2023-01-09 LAB — COMPREHENSIVE METABOLIC PANEL
ALT: 8 U/L (ref 0–44)
AST: 13 U/L — ABNORMAL LOW (ref 15–41)
Albumin: 2.5 g/dL — ABNORMAL LOW (ref 3.5–5.0)
Alkaline Phosphatase: 62 U/L (ref 38–126)
Anion gap: 5 (ref 5–15)
BUN: 35 mg/dL — ABNORMAL HIGH (ref 8–23)
CO2: 31 mmol/L (ref 22–32)
Calcium: 7.7 mg/dL — ABNORMAL LOW (ref 8.9–10.3)
Chloride: 98 mmol/L (ref 98–111)
Creatinine, Ser: 0.85 mg/dL (ref 0.44–1.00)
GFR, Estimated: >60 mL/min (ref >60)
Glucose, Bld: 83 mg/dL (ref 70–99)
Potassium: 3.6 mmol/L (ref 3.5–5.1)
Sodium: 134 mmol/L — ABNORMAL LOW (ref 135–145)
Total Bilirubin: 1.2 mg/dL (ref 0.3–1.2)
Total Protein: 4.9 g/dL — ABNORMAL LOW (ref 6.5–8.1)

## 2023-01-09 LAB — CBC
HCT: 29.6 % — ABNORMAL LOW (ref 36.0–46.0)
Hemoglobin: 9.3 g/dL — ABNORMAL LOW (ref 12.0–15.0)
MCH: 27.1 pg (ref 26.0–34.0)
MCHC: 31.4 g/dL (ref 30.0–36.0)
MCV: 86.3 fL (ref 80.0–100.0)
Platelets: 152 10*3/uL (ref 150–400)
RBC: 3.43 MIL/uL — ABNORMAL LOW (ref 3.87–5.11)
RDW: 15.3 % (ref 11.5–15.5)
WBC: 1 10*3/uL — CL (ref 4.0–10.5)
nRBC: 0 % (ref 0.0–0.2)

## 2023-01-09 SURGERY — LAPAROTOMY, EXPLORATORY
Anesthesia: General

## 2023-01-09 MED ORDER — LIDOCAINE HCL (PF) 2 % IJ SOLN
INTRAMUSCULAR | Status: AC
  Filled 2023-01-09: qty 5

## 2023-01-09 MED ORDER — OXYCODONE HCL 5 MG PO TABS
5.0000 mg | ORAL_TABLET | ORAL | Status: DC | PRN
  Administered 2023-01-10 – 2023-01-14 (×6): 5 mg via ORAL
  Filled 2023-01-09 (×7): qty 1

## 2023-01-09 MED ORDER — VANCOMYCIN HCL IN DEXTROSE 1-5 GM/200ML-% IV SOLN
1000.0000 mg | INTRAVENOUS | Status: DC
  Administered 2023-01-10: 1000 mg via INTRAVENOUS
  Filled 2023-01-09: qty 200

## 2023-01-09 MED ORDER — PHENYLEPHRINE 80 MCG/ML (10ML) SYRINGE FOR IV PUSH (FOR BLOOD PRESSURE SUPPORT)
PREFILLED_SYRINGE | INTRAVENOUS | Status: AC
  Filled 2023-01-09: qty 10

## 2023-01-09 MED ORDER — ACETAMINOPHEN 10 MG/ML IV SOLN
INTRAVENOUS | Status: AC
  Filled 2023-01-09: qty 100

## 2023-01-09 MED ORDER — 0.9 % SODIUM CHLORIDE (POUR BTL) OPTIME
TOPICAL | Status: DC | PRN
  Administered 2023-01-09: 1000 mL

## 2023-01-09 MED ORDER — ROCURONIUM BROMIDE 100 MG/10ML IV SOLN
INTRAVENOUS | Status: DC | PRN
  Administered 2023-01-09 (×2): 30 mg via INTRAVENOUS
  Administered 2023-01-09: 40 mg via INTRAVENOUS

## 2023-01-09 MED ORDER — PROPOFOL 10 MG/ML IV BOLUS
INTRAVENOUS | Status: DC | PRN
  Administered 2023-01-09: 100 mg via INTRAVENOUS

## 2023-01-09 MED ORDER — ONDANSETRON HCL 4 MG/2ML IJ SOLN
INTRAMUSCULAR | Status: DC | PRN
  Administered 2023-01-09: 4 mg via INTRAVENOUS

## 2023-01-09 MED ORDER — TBO-FILGRASTIM 480 MCG/0.8ML ~~LOC~~ SOSY
480.0000 ug | PREFILLED_SYRINGE | Freq: Every day | SUBCUTANEOUS | Status: DC
  Administered 2023-01-10 – 2023-01-13 (×4): 480 ug via SUBCUTANEOUS
  Filled 2023-01-09 (×5): qty 0.8

## 2023-01-09 MED ORDER — ACETAMINOPHEN 500 MG PO TABS
1000.0000 mg | ORAL_TABLET | Freq: Four times a day (QID) | ORAL | Status: DC
  Administered 2023-01-10 – 2023-01-13 (×9): 1000 mg via ORAL
  Filled 2023-01-09 (×12): qty 2

## 2023-01-09 MED ORDER — ROCURONIUM BROMIDE 10 MG/ML (PF) SYRINGE
PREFILLED_SYRINGE | INTRAVENOUS | Status: AC
  Filled 2023-01-09: qty 10

## 2023-01-09 MED ORDER — FENTANYL CITRATE (PF) 100 MCG/2ML IJ SOLN
INTRAMUSCULAR | Status: AC
  Filled 2023-01-09: qty 2

## 2023-01-09 MED ORDER — STERILE WATER FOR IRRIGATION IR SOLN
Status: DC | PRN
  Administered 2023-01-09: 1500 mL

## 2023-01-09 MED ORDER — LIDOCAINE HCL (CARDIAC) PF 100 MG/5ML IV SOSY
PREFILLED_SYRINGE | INTRAVENOUS | Status: DC | PRN
  Administered 2023-01-09: 80 mg via INTRAVENOUS

## 2023-01-09 MED ORDER — IPRATROPIUM-ALBUTEROL 0.5-2.5 (3) MG/3ML IN SOLN
RESPIRATORY_TRACT | Status: AC
  Administered 2023-01-09: 3 mL via RESPIRATORY_TRACT
  Filled 2023-01-09: qty 3

## 2023-01-09 MED ORDER — IPRATROPIUM-ALBUTEROL 0.5-2.5 (3) MG/3ML IN SOLN: 3.0000 mL | RESPIRATORY_TRACT | Status: DC

## 2023-01-09 MED ORDER — ONDANSETRON HCL 4 MG/2ML IJ SOLN
INTRAMUSCULAR | Status: AC
  Filled 2023-01-09: qty 2

## 2023-01-09 MED ORDER — IPRATROPIUM-ALBUTEROL 0.5-2.5 (3) MG/3ML IN SOLN
3.0000 mL | Freq: Three times a day (TID) | RESPIRATORY_TRACT | Status: DC
  Administered 2023-01-09 – 2023-01-10 (×3): 3 mL via RESPIRATORY_TRACT
  Filled 2023-01-09 (×3): qty 3

## 2023-01-09 MED ORDER — ALBUTEROL SULFATE HFA 108 (90 BASE) MCG/ACT IN AERS
INHALATION_SPRAY | RESPIRATORY_TRACT | Status: DC | PRN
  Administered 2023-01-09: 4 via RESPIRATORY_TRACT

## 2023-01-09 MED ORDER — FENTANYL CITRATE (PF) 100 MCG/2ML IJ SOLN
INTRAMUSCULAR | Status: AC
  Administered 2023-01-09: 50 ug via INTRAVENOUS
  Filled 2023-01-09: qty 2

## 2023-01-09 MED ORDER — FENTANYL CITRATE (PF) 100 MCG/2ML IJ SOLN
25.0000 ug | INTRAMUSCULAR | Status: DC | PRN
  Administered 2023-01-09: 50 ug via INTRAVENOUS

## 2023-01-09 MED ORDER — DEXAMETHASONE SODIUM PHOSPHATE 10 MG/ML IJ SOLN
INTRAMUSCULAR | Status: AC
  Filled 2023-01-09: qty 1

## 2023-01-09 MED ORDER — DEXAMETHASONE SODIUM PHOSPHATE 10 MG/ML IJ SOLN
INTRAMUSCULAR | Status: DC | PRN
  Administered 2023-01-09: 10 mg via INTRAVENOUS

## 2023-01-09 MED ORDER — SUGAMMADEX SODIUM 200 MG/2ML IV SOLN
INTRAVENOUS | Status: DC | PRN
  Administered 2023-01-09: 200 mg via INTRAVENOUS

## 2023-01-09 MED ORDER — SODIUM CHLORIDE (PF) 0.9 % IJ SOLN
INTRAMUSCULAR | Status: DC | PRN
  Administered 2023-01-09: 100 mL

## 2023-01-09 MED ORDER — PHENYLEPHRINE HCL (PRESSORS) 10 MG/ML IV SOLN
INTRAVENOUS | Status: DC | PRN
  Administered 2023-01-09: 160 ug via INTRAVENOUS
  Administered 2023-01-09: 80 ug via INTRAVENOUS

## 2023-01-09 MED ORDER — FENTANYL CITRATE (PF) 100 MCG/2ML IJ SOLN
INTRAMUSCULAR | Status: DC | PRN
  Administered 2023-01-09 (×2): 50 ug via INTRAVENOUS

## 2023-01-09 MED ORDER — ACETAMINOPHEN 10 MG/ML IV SOLN
INTRAVENOUS | Status: DC | PRN
  Administered 2023-01-09: 1000 mg via INTRAVENOUS

## 2023-01-09 SURGICAL SUPPLY — 48 items
ADHESIVE MASTISOL STRL (MISCELLANEOUS) IMPLANT
APPLIER CLIP 11 MED OPEN (CLIP)
APPLIER CLIP 13 LRG OPEN (CLIP)
BARRIER ADH SEPRAFILM 3INX5IN (MISCELLANEOUS) IMPLANT
BLADE CLIPPER SURG (BLADE) ×1 IMPLANT
BULB RESERV EVAC DRAIN JP 100C (MISCELLANEOUS) IMPLANT
CHLORAPREP W/TINT 26 (MISCELLANEOUS) ×1 IMPLANT
CLIP APPLIE 11 MED OPEN (CLIP) IMPLANT
CLIP APPLIE 13 LRG OPEN (CLIP) IMPLANT
COVER BACK TABLE REUSABLE LG (DRAPES) IMPLANT
DRAIN CHANNEL JP 19F (MISCELLANEOUS) IMPLANT
DRAPE LAPAROTOMY 100X77 ABD (DRAPES) ×1 IMPLANT
DRSG OPSITE POSTOP 4X10 (GAUZE/BANDAGES/DRESSINGS) IMPLANT
DRSG OPSITE POSTOP 4X12 (GAUZE/BANDAGES/DRESSINGS) IMPLANT
ELECT BLADE 6.5 EXT (BLADE) ×1 IMPLANT
ELECT REM PT RETURN 9FT ADLT (ELECTROSURGICAL) ×1
ELECTRODE REM PT RTRN 9FT ADLT (ELECTROSURGICAL) ×1 IMPLANT
GLOVE BIO SURGEON STRL SZ7 (GLOVE) ×2 IMPLANT
GOWN STRL REUS W/ TWL LRG LVL3 (GOWN DISPOSABLE) ×2 IMPLANT
GOWN STRL REUS W/TWL LRG LVL3 (GOWN DISPOSABLE) ×2
KIT OSTOMY 2 PC DRNBL 2.25 STR (WOUND CARE) IMPLANT
KIT OSTOMY DRAINABLE 2.25 STR (WOUND CARE) ×2
LIGASURE IMPACT 36 18CM CVD LR (INSTRUMENTS) IMPLANT
MANIFOLD NEPTUNE II (INSTRUMENTS) ×1 IMPLANT
NEEDLE HYPO 22GX1.5 SAFETY (NEEDLE) ×2 IMPLANT
PACK BASIN MAJOR ARMC (MISCELLANEOUS) ×1 IMPLANT
RELOAD PROXIMATE 75MM BLUE (ENDOMECHANICALS) ×2 IMPLANT
RELOAD STAPLE 75 3.8 BLU REG (ENDOMECHANICALS) IMPLANT
SPONGE T-LAP 18X18 ~~LOC~~+RFID (SPONGE) ×1 IMPLANT
SPONGE T-LAP 18X36 ~~LOC~~+RFID STR (SPONGE) IMPLANT
STAPLER PROXIMATE 75MM BLUE (STAPLE) IMPLANT
STAPLER SKIN PROX 35W (STAPLE) ×1 IMPLANT
SUT ETHILON 3-0 (SUTURE) IMPLANT
SUT PDS AB 0 CT1 27 (SUTURE) ×3 IMPLANT
SUT SILK 2 0 (SUTURE) ×1
SUT SILK 2 0 SH CR/8 (SUTURE) ×1 IMPLANT
SUT SILK 2 0SH CR/8 30 (SUTURE) ×1 IMPLANT
SUT SILK 2-0 18XBRD TIE 12 (SUTURE) ×1 IMPLANT
SUT VIC AB 0 CT1 36 (SUTURE) ×2 IMPLANT
SUT VIC AB 2-0 SH 27 (SUTURE)
SUT VIC AB 2-0 SH 27XBRD (SUTURE) IMPLANT
SUT VIC AB 3-0 SH 27 (SUTURE) ×6
SUT VIC AB 3-0 SH 27X BRD (SUTURE) ×1 IMPLANT
SYR 20ML LL LF (SYRINGE) ×2 IMPLANT
SYR 3ML LL SCALE MARK (SYRINGE) ×1 IMPLANT
TRAP FLUID SMOKE EVACUATOR (MISCELLANEOUS) ×1 IMPLANT
TRAY FOLEY MTR SLVR 16FR STAT (SET/KITS/TRAYS/PACK) ×1 IMPLANT
WATER STERILE IRR 500ML POUR (IV SOLUTION) ×1 IMPLANT

## 2023-01-09 NOTE — Progress Notes (Signed)
Pharmacy Antibiotic Note  Natasha [name] is a 77 y.o. female admitted on 01/06/2023 with cellulitis. PMH significant for T2DM, COPD on 3L, postherpetic neuralgia, TUD, dCHF with recent pleural effusion, gastric lymphoma, anemia, thrombocytopenia. She was hospitalized from 2/20 to 12/17/2022 with L leg cellulitis and received vancomycin and cefepime. Cultures from the admission were negative. She presented to ED 3/14 again with L knee pain and swelling. She had L knee arthroscopic washout by Ortho on 3/15, and ID was consulted for suspected septic knee. Pharmacy has been consulted for Vancomycin dosing.  Plan: Day 3 of antibiotics Decrease vancomycin dose from 1250 mg IV Q24H to 1000 mg IV Q24H Goal AUC 400-550. Expected AUC: 448.0 Expected Css min: 10.5 SCr used: 0.85  Weight used: IBW, Vd used: 0.72 (BMI 24.1) Patient is also on ceftriaxone 2 g IV Q24H and metronidazole 500 mg Q8H Continue to monitor renal function and follow culture results  Height: 5\' 5"  (165.1 cm) Weight: 65.8 kg (145 lb) IBW/kg (Calculated) : 57  Temp (24hrs), Avg:98.6 F (37 C), Min:98 F (36.7 C), Max:99 F (37.2 C)  Recent Labs  Lab 01/06/23 1920 01/08/23 0342 01/09/23 0600  WBC 1.7* 0.4* 1.0*  CREATININE 0.80 1.03* 0.85     Estimated Creatinine Clearance: 51.5 mL/min (by C-G formula based on SCr of 0.85 mg/dL).    No Known Allergies  Antimicrobials this admission: 3/14 Ceftriaxone  >>  3/15 Vancomycin  >>  3/16 Metronidazole >>  Dose adjustments this admission: 3/17 Vancomycin 1250 mg IV Q24H >> Vancomycin 1000 mg IV Q24H  Microbiology results: 3/15 BCx: NG2D 3/14 Knee Fluid Cx: NG2D 3/15 L Knee Synovial Fluid Cx: NG<24H  Thank you for allowing pharmacy to be a part of this patient's care.  Gretel Acre, PharmD PGY1 Pharmacy Resident 01/09/2023 1:54 PM

## 2023-01-09 NOTE — Anesthesia Procedure Notes (Signed)
Procedure Name: Intubation Date/Time: 01/09/2023 4:22 PM  Performed by: Aline Brochure, CRNAPre-anesthesia Checklist: Patient identified, Patient being monitored, Timeout performed, Emergency Drugs available and Suction available Patient Re-evaluated:Patient Re-evaluated prior to induction Oxygen Delivery Method: Circle system utilized Preoxygenation: Pre-oxygenation with 100% oxygen Induction Type: IV induction Ventilation: Mask ventilation without difficulty Laryngoscope Size: 3 and McGraph Grade View: Grade I Tube type: Oral Tube size: 7.0 mm Number of attempts: 1 Airway Equipment and Method: Stylet and Video-laryngoscopy Placement Confirmation: ETT inserted through vocal cords under direct vision, positive ETCO2 and breath sounds checked- equal and bilateral Secured at: 21 cm Tube secured with: Tape Dental Injury: Teeth and Oropharynx as per pre-operative assessment

## 2023-01-09 NOTE — Assessment & Plan Note (Signed)
Stage IV followed by Dr. Janese Banks.  Consult oncology.  Overall poor prognosis daughter in agreement with keeping her DNR and have hospice follow at discharge.

## 2023-01-09 NOTE — Assessment & Plan Note (Signed)
Tmax of 101.  WBC of 0.4-> 1.0-consult oncology.  Granix ordered

## 2023-01-09 NOTE — Assessment & Plan Note (Signed)
Foley placed in ED, renal ultrasound does not show any hydronephrosis

## 2023-01-09 NOTE — Assessment & Plan Note (Signed)
Continue sliding scale insulin coverage ?

## 2023-01-09 NOTE — Progress Notes (Signed)
Subjective: 2 Days Post-Op Procedure(s) (LRB): ARTHROSCOPIC LEFT KNEE WASHOUT (Left) Patient reports pain as moderate.  Does feel that the knee feels better than prior to surgery. Patient recently developed a Volvulus, discussions with general surgery on surgery vs. Possible hospice. Pt and care management to assist with discharge planning. Negative for chest pain and shortness of breath Fever: No recent fevers. Gastrointestinal:Continued abdominal pain from Volvulus.  Objective: Vital signs in last 24 hours: Temp:  [98 F (36.7 C)-99 F (37.2 C)] 98.5 F (36.9 C) (03/17 0822) Pulse Rate:  [75-96] 82 (03/17 0822) Resp:  [17-19] 19 (03/17 0822) BP: (103-126)/(55-66) 110/55 (03/17 0822) SpO2:  [96 %-99 %] 99 % (03/17 0822)  Intake/Output from previous day:  Intake/Output Summary (Last 24 hours) at 01/09/2023 0957 Last data filed at 01/09/2023 0518 Gross per 24 hour  Intake 1403.17 ml  Output 1000 ml  Net 403.17 ml    Intake/Output this shift: No intake/output data recorded.  Labs: Recent Labs    01/06/23 1920 01/08/23 0342 01/09/23 0600  HGB 9.1* 7.7* 9.3*   Recent Labs    01/08/23 0342 01/09/23 0600  WBC 0.4* 1.0*  RBC 2.92* 3.43*  HCT 25.9* 29.6*  PLT 137* 152   Recent Labs    01/08/23 0342 01/09/23 0600  NA 138 134*  K 3.8 3.6  CL 97* 98  CO2 30 31  BUN 36* 35*  CREATININE 1.03* 0.85  GLUCOSE 294* 83  CALCIUM 8.1* 7.7*   Recent Labs    01/08/23 2200  INR 1.4*     EXAM General - Patient is Alert, Appropriate, and Oriented Extremity - Neurovascular intact Dorsiflexion/Plantar flexion intact Dressing/Incision - ACE wrap intact to the left knee.  Bloody drainage noted posteriorly. Hemovac with mild bloody drainage noted, no purulent material.  Hemovac removed today without issue. No erythema or purulent drainage noted to the left knee. New dressings and ACE wrap applied to the left knee. Motor Function - intact, moving foot and toes well on  exam.    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   CHF (congestive heart failure) (HCC)    COPD (chronic obstructive pulmonary disease) (HCC)    Diabetes mellitus without complication (HCC)    Hypotension    Pleural effusion    ARMc 816ml,  2 weeks ago   Vaginal delivery    x 5    Assessment/Plan: 2 Days Post-Op Procedure(s) (LRB): ARTHROSCOPIC LEFT KNEE WASHOUT (Left) Active Problems:   Primary osteoarthritis of both knees   Chronic obstructive pulmonary disease (COPD) (HCC)   Anemia of chronic disease   Goals of care, counseling/discussion   Nodal marginal zone B-cell lymphoma (HCC)   Chronic respiratory failure with hypoxia (HCC)   Type 2 diabetes mellitus without complication, with long-term current use of insulin (HCC)   Neutropenic fever (HCC)   Pancytopenia (HCC)   Urinary retention   Effusion, left knee   Urinary tract infection  Estimated body mass index is 24.13 kg/m as calculated from the following:   Height as of this encounter: 5\' 5"  (1.651 m).   Weight as of this encounter: 65.8 kg. Advance diet Up with therapy D/C IV fluids when tolerating po intake.  Labs reviewed this AM, WBC 1.0 this morning.  Hg 9.3 this morning. Hemovac removed this morning without complication. Cultures from aspiration and surgery still pending at this time. Volvulus developed,  general surgery has been consulted. Up with therapy today as tolerated.  DVT Prophylaxis - TED hose and Eliquis Weight-Bearing as tolerated to left leg  J. Cameron Proud, PA-C [facility] Orthopaedic Surgery 01/09/2023, 9:57 AM

## 2023-01-09 NOTE — Assessment & Plan Note (Signed)
Confirmed on a CT scan yesterday.  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  Surgery Dr. Adora Fridge has seen the patient in consultation and discussed with patient and her daughter who are wanting surgery even though understands this would be very high risk

## 2023-01-09 NOTE — Assessment & Plan Note (Addendum)
Hemoglobin 9.3

## 2023-01-09 NOTE — Assessment & Plan Note (Addendum)
Acute urinary retention Foley catheter was placed in the ED Renal ultrasound did not show any hydronephrosis or pathology Continue Rocephin

## 2023-01-09 NOTE — Progress Notes (Signed)
[facility])  Plan is for patient to be admitted at home with [facility] hospice services. Patient requestd CT scan due to abdominal pain. CT showed volvulus. Patient requested procedure for volvulus.     [facility] will continue to follow for any discharge planning needs and to coordinate continuation of hospice care.     Please call with any questions/concerns.    Thank you for the opportunity to participate in this patient's care.   Phillis Haggis, MSW [facility] Liaison  [phone]

## 2023-01-09 NOTE — Consult Note (Addendum)
Patient ID: Natasha [name], female   DOB: 06/15/1946, 77 y.o.   MRN: [id number]  HPI Natasha [name] is a 77 y.o. female seen in consultation at the request of Dr. Manuella Ghazi.  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  She came in with neutropenic fever thought to have a septic arthritis and recently underwent arthroscopy 2 days ago.  Yesterday she experienced severe pain in the lower abdomen constant today the pain seems to be a little bit better.  This prompted a CT scan of the abdomen and pelvis that I have personally reviewed showing evidence of a cecal volvulus.  No evidence of perforation.  Yesterday I did talk to the daughter immediately after I found out about the CT scan. 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  I had an extensive discussion and there is being talks for palliation.  However when I came into the room the patient did not want to have any hospice measures or palliation and wanted to have the surgery.  I was very candid with her regarding her situation.  She is malnourished she was on Eliquis which I stopped yesterday, she is pancytopenia heart failure and COPD this makes her extremely high risk for any operative morbidity or mortality, on the other hand if we do not think she will likely perforated die from sepsis.  I do not think that the patient is ready.  After an extensive discussion with both the daughter and the patient they have decided to proceed with emergent surgery. 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   CHF (congestive heart failure) ([facility])    COPD (chronic obstructive pulmonary disease) ([facility])    Diabetes mellitus without complication ([facility])    Hypotension    Pleural effusion    ARMc 813ml,  2  weeks ago   Vaginal delivery    x 5    Past Surgical History:  Procedure Laterality Date   IR IMAGING GUIDED PORT INSERTION  08/16/2022   TONSILLECTOMY Bilateral    as a child    Family History  Problem Relation Age of Onset   Cancer Mother    Breast cancer Mother    Cancer Father    Alzheimer's disease Father    Lung cancer Father        lung   Varicose Veins Brother    Heart attack Brother     Social History Social History   Tobacco Use   Smoking status: Former    Packs/day: 2.00    Years: 55.00    Additional pack years: 0.00    Total pack years: 110.00    Types: Cigarettes    Start date: 07/07/1961    Quit date: 08/02/2022    Years since quitting: 0.4   Smokeless tobacco: Never   Tobacco comments:    1/2 pack she states but family says 2 PPD  Vaping Use   Vaping Use: Never used  Substance Use Topics   Alcohol use: No   Drug use: Never    No Known Allergies  Current Facility-Administered Medications  Medication Dose Route Frequency Provider Last Rate Last Admin   0.9 %  sodium chloride infusion   Intravenous  Continuous Poggi, Marshall Cork, MD       acetaminophen (TYLENOL) tablet 650 mg  650 mg Oral Q6H PRN Poggi, Marshall Cork, MD   650 mg at 01/07/23 [id number]   Or   acetaminophen (TYLENOL) suppository 650 mg  650 mg Rectal Q6H PRN Poggi, Marshall Cork, MD       albuterol (PROVENTIL) (2.5 MG/3ML) 0.083% nebulizer solution 2.5 mg  2.5 mg Nebulization Q2H PRN Poggi, Marshall Cork, MD       alum & mag hydroxide-simeth (MAALOX/MYLANTA) 200-200-20 MG/5ML suspension 30 mL  30 mL Oral Q6H PRN Max Sane, MD   30 mL at 01/08/23 1220   bisacodyl (DULCOLAX) suppository 10 mg  10 mg Rectal Daily PRN Poggi, Marshall Cork, MD       ceFAZolin (ANCEF) IVPB 2g/100 mL premix  2 g Intravenous 30 min Pre-Op Poggi, Marshall Cork, MD       cefTRIAXone (ROCEPHIN) 2 g in sodium chloride 0.9 % 100 mL IVPB  2 g Intravenous Q24H Poggi, Marshall Cork, MD 200 mL/hr at 01/08/23 2110 2 g at 01/08/23 2110   Chlorhexidine Gluconate Cloth  2 % PADS 6 each  6 each Topical Daily Max Sane, MD   6 each at 01/08/23 [id number]   diphenhydrAMINE (BENADRYL) 12.5 MG/5ML elixir 12.5-25 mg  12.5-25 mg Oral Q4H PRN Poggi, Marshall Cork, MD       docusate sodium (COLACE) capsule 100 mg  100 mg Oral BID Corky Mull, MD   100 mg at 01/08/23 2110   HYDROcodone-acetaminophen (NORCO/VICODIN) 5-325 MG per tablet 1-2 tablet  1-2 tablet Oral Q4H PRN Poggi, Marshall Cork, MD   2 tablet at 01/09/23 0041   insulin aspart (novoLOG) injection 0-15 Units  0-15 Units Subcutaneous Q4H Corky Mull, MD   2 Units at 01/09/23 0155   ipratropium-albuterol (DUONEB) 0.5-2.5 (3) MG/3ML nebulizer solution 3 mL  3 mL Nebulization TID Emeterio Reeve, DO   3 mL at 01/09/23 0727   levothyroxine (SYNTHROID) tablet 50 mcg  50 mcg Oral Q0600 Corky Mull, MD   50 mcg at 01/09/23 0645   magnesium hydroxide (MILK OF MAGNESIA) suspension 30 mL  30 mL Oral Daily PRN Poggi, Marshall Cork, MD   30 mL at 01/08/23 0636   metoCLOPramide (REGLAN) tablet 5-10 mg  5-10 mg Oral Q8H PRN Poggi, Marshall Cork, MD       Or   metoCLOPramide (REGLAN) injection 5-10 mg  5-10 mg Intravenous Q8H PRN Poggi, Marshall Cork, MD       metroNIDAZOLE (FLAGYL) IVPB 500 mg  500 mg Intravenous Q8H Imani Sherrin, [place] F, MD 100 mL/hr at 01/09/23 0645 500 mg at 01/09/23 0645   morphine (PF) 2 MG/ML injection 2 mg  2 mg Intravenous Q2H PRN Poggi, Marshall Cork, MD       ondansetron (ZOFRAN) tablet 4 mg  4 mg Oral Q6H PRN Poggi, Marshall Cork, MD       Or   ondansetron (ZOFRAN) injection 4 mg  4 mg Intravenous Q6H PRN Poggi, Marshall Cork, MD       pantoprazole (PROTONIX) EC tablet 40 mg  40 mg Oral Daily Poggi, Marshall Cork, MD   40 mg at 01/08/23 0935   sodium phosphate (FLEET) 7-19 GM/118ML enema 1 enema  1 enema Rectal Once PRN Poggi, Marshall Cork, MD       [facility]) injection 480 mcg  480 mcg Subcutaneous Daily Cammie Sickle, MD       Tenofovir Alafenamide Fumarate TABS  25 mg  25 mg Oral Daily Poggi, Marshall Cork, MD   25 mg at 01/08/23 0935   vancomycin  (VANCOREADY) IVPB 1250 mg/250 mL  1,250 mg Intravenous Q24H Corky Mull, MD 166.7 mL/hr at 01/09/23 0501 1,250 mg at 01/09/23 0501     Review of Systems Full ROS  was asked and was negative except for the information on the HPI  Physical Exam Blood pressure (!) 110/55, pulse 82, temperature 98.5 F (36.9 C), resp. rate 19, height 5\' 5"  (1.651 m), weight 65.8 kg, SpO2 99 %. CONSTITUTIONAL: she is chronically ill, malnourished. EYES: Pupils are equal, round, Sclera are non-icteric. EARS, NOSE, MOUTH AND THROAT:. The oral mucosa is pink and moist. Hearing is intact to voice. LYMPH NODES:  Lymph nodes in the neck are normal. RESPIRATORY:  Lungs are decreased , with equal breath sounds bilaterally, and without pathologic use of accessory muscles. CARDIOVASCULAR: Heart is regular without murmurs, gallops, or rubs. GI: The abdomen is soft, distended with tenderness to palpation on Right disease and rebound tenderness and peritonitis GU: Rectal deferred.   MUSCULOSKELETAL: Normal muscle strength and tone. No cyanosis or edema.   SKIN: Turgor is good and there are no pathologic skin lesions or ulcers. NEUROLOGIC: Motor and sensation is grossly normal. Cranial nerves are grossly intact. PSYCH:  Oriented to person, place and time. Affect is normal.  Data Reviewed  I have personally reviewed the patient's imaging, laboratory findings and medical records.    Assessment/Plan 77 year old female debilitated with a stage IV lymphoma with malnutrition heart failure , COPD on home O2, failure to thrive pancytopenia now presents with cecal volvulus with concerning abdominal exam.  Very difficult situation.  He is an extremely high risk for any operative morbidity or mortality.  I had an extensive discussion with daughter and the patient.  Initially daughter wanted to do some hospice and palliative care.  The patient however is adamant about having surgical intervention.  I had an extensive discussion with  both the daughter and the patient.  Unfortunately no good outcomes and answers.  The best shot will be to proceed to the operating room for emergent colectomy and ileostomy.  She understands that this will be high risk , in addition she does have ascites that makes things complicated.  She also has significant comorbidities and she can die in the operating room.  They are also aware of a potential prolonged hospital course.  Currently the patient wishes to proceed.  She initially was very hesitant about a colostomy but I stated that this will be the only reasonable way to give her a decent chance. We will proceed with emergent laparotomy, bowel resection and creation of stoma.  I spent > 75 minutes in this encounter including extensive counseling, coordination of care, placing orders and performing appropriate documentation   Caroleen Hamman, MD FACS General Surgeon 01/09/2023, 9:54 AM

## 2023-01-09 NOTE — Consult Note (Signed)
[facility] NOTE  Patient Care Team: [name], NP as PCP - General (Nurse Practitioner) [name], RN (Inactive) as Case Manager [name], RN as [facility] Management  CHIEF COMPLAINTS/PURPOSE OF CONSULTATION: Marginal lymphoma  HISTORY OF PRESENTING ILLNESS:  Natasha [name] 77 y.o.  female female patient with multiple medical problems including type 2 diabetes poorly controlled COPD/CHF/chronic respiratory failure marginal lymphoma-also anemia/thrombocytopenia/neutropenia is currently admitted to hospital for acute urinary retention.  Also this admission noted to have left knee septic arthritis s/p arthrocentesis with orthopedics.  Again admission was complicated by abdominal pain which on further evaluation with a CT scan showed cecal volvulus.  Patient currently status post evaluation with surgery  Patient continues to have significant abdominal pain.  Patient not moving bowels.  Complains of nausea.  With regards to marginal lymphoma-patient has received Revlimid/rituximab with Dr. Janese Banks.  Review of Systems  Constitutional:  Positive for chills, fever and malaise/fatigue. Negative for diaphoresis and weight loss.  HENT:  Negative for nosebleeds and sore throat.   Eyes:  Negative for double vision.  Respiratory:  Positive for shortness of breath. Negative for cough, hemoptysis, sputum production and wheezing.   Cardiovascular:  Negative for chest pain, palpitations, orthopnea and leg swelling.  Gastrointestinal:  Positive for abdominal pain, nausea and vomiting. Negative for blood in stool, constipation, diarrhea, heartburn and melena.  Genitourinary:  Negative for dysuria, frequency and urgency.  Musculoskeletal:  Positive for joint pain. Negative for back pain.  Skin: Negative.  Negative for itching and rash.  Neurological:  Positive for weakness. Negative for dizziness, tingling, focal weakness and headaches.   Endo/Heme/Allergies:  Does not bruise/bleed easily.  Psychiatric/Behavioral:  Negative for depression. The patient is not nervous/anxious and does not have insomnia.     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   CHF (congestive heart failure) (H[facility])    COPD (chronic obstructive pulmonary disease) (HCC)    Diabetes mellitus without complication (HCC)    Hypotension    Pleural effusion    ARMc 84ml,  2 weeks ago   Vaginal delivery    x 5    SURGICAL HISTORY: Past Surgical History:  Procedure Laterality Date   IR IMAGING GUIDED PORT INSERTION  08/16/2022   TONSILLECTOMY Bilateral    as a child    SOCIAL HISTORY: Social History   Socioeconomic History   Marital status: Widowed    Spouse name: Not on file   Number of children: 2   Years of education: Not on file   Highest education level: 9th grade  Occupational History   Occupation: unemployed  Tobacco Use   Smoking status: Former    Packs/day: 2.00    Years: 55.00    Additional pack years: 0.00    Total pack years: 110.00    Types: Cigarettes    Start date: 07/07/1961    Quit date: 08/02/2022    Years since quitting: 0.4   Smokeless tobacco: Never   Tobacco comments:    1/2 pack she states but family says 2 PPD  Vaping Use   Vaping Use: Never used  Substance and Sexual Activity   Alcohol use: No   Drug use: Never   Sexual activity: Not Currently    Partners: Male    Birth control/protection: Post-menopausal  Other Topics Concern   Not  on file  Social History Narrative   08/14/20   From: [place]   Living: to be near daughter   Work: retired      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      Enjoys: stays at home      Exercise: walking to her daughter's store   Diet: eats fruit, air fried chicken      Safety   Seat belts: Yes    Guns: No   Safe  in relationships: Yes    Social Determinants of Health   Financial Resource Strain: Medium Risk (08/24/2022)   Overall Financial Resource Strain (CARDIA)    Difficulty of Paying Living Expenses: Somewhat hard  Food Insecurity: No Food Insecurity (01/07/2023)   Hunger Vital Sign    Worried About Running Out of Food in the Last Year: Never true    Ran Out of Food in the Last Year: Never true  Transportation Needs: Unmet Transportation Needs (01/07/2023)   PRAPARE - Transportation    Lack of Transportation (Medical): Yes    Lack of Transportation (Non-Medical): Yes  Physical Activity: Inactive (08/24/2022)   Exercise Vital Sign    Days of Exercise per Week: 0 days    Minutes of Exercise per Session: 0 min  Stress: Stress Concern Present (08/24/2022)   [place]    Feeling of Stress : To some extent  Social Connections: Socially Isolated (08/24/2022)   Social Connection and Isolation Panel [NHANES]    Frequency of Communication with Friends and Family: More than three times a week    Frequency of Social Gatherings with Friends and Family: More than three times a week    Attends Religious Services: Never    Marine scientist or Organizations: No    Attends Archivist Meetings: Never    Marital Status: Widowed  Intimate Partner Violence: Not At Risk (01/07/2023)   Humiliation, Afraid, Rape, and Kick questionnaire    Fear of Current or Ex-Partner: No    Emotionally Abused: No    Physically Abused: No    Sexually Abused: No    FAMILY HISTORY: Family History  Problem Relation Age of Onset   Cancer Mother    Breast cancer Mother    Cancer Father    Alzheimer's disease Father    Lung cancer Father        lung   Varicose Veins Brother    Heart attack Brother     ALLERGIES:  has No Known Allergies.  MEDICATIONS:  Current Facility-Administered Medications  Medication Dose Route Frequency Provider  Last Rate Last Admin   0.9 %  sodium chloride infusion   Intravenous Continuous Poggi, Marshall Cork, MD   New Bag at 01/09/23 1628   [MAR Hold] acetaminophen (TYLENOL) tablet 650 mg  650 mg Oral Q6H PRN Corky Mull, MD   650 mg at 01/07/23 0942   Or   [MAR Hold] acetaminophen (TYLENOL) suppository 650 mg  650 mg Rectal Q6H PRN Poggi, Marshall Cork, MD       [MAR Hold] albuterol (PROVENTIL) (2.5 MG/3ML) 0.083% nebulizer solution 2.5 mg  2.5 mg Nebulization Q2H PRN Poggi, Marshall Cork, MD       [MAR Hold] alum & mag hydroxide-simeth (MAALOX/MYLANTA) 200-200-20 MG/5ML suspension 30 mL  30 mL Oral Q6H PRN Max Sane, MD   30 mL at 01/08/23 1220   [MAR Hold] bisacodyl (DULCOLAX) suppository  10 mg  10 mg Rectal Daily PRN Poggi, Marshall Cork, MD       [MAR Hold] ceFAZolin (ANCEF) IVPB 2g/100 mL premix  2 g Intravenous 30 min Pre-Op Poggi, Marshall Cork, MD       [facility] Hold] cefTRIAXone (ROCEPHIN) 2 g in sodium chloride 0.9 % 100 mL IVPB  2 g Intravenous Q24H Poggi, Marshall Cork, MD 200 mL/hr at 01/08/23 2110 2 g at 01/08/23 2110   [MAR Hold] Chlorhexidine Gluconate Cloth 2 % PADS 6 each  6 each Topical Daily Max Sane, MD   6 each at 01/09/23 1112   [MAR Hold] diphenhydrAMINE (BENADRYL) 12.5 MG/5ML elixir 12.5-25 mg  12.5-25 mg Oral Q4H PRN Poggi, Marshall Cork, MD       [MAR Hold] docusate sodium (COLACE) capsule 100 mg  100 mg Oral BID Corky Mull, MD   100 mg at 01/08/23 2110   fentaNYL (SUBLIMAZE) injection 25-50 mcg  25-50 mcg Intravenous Q5 min PRN Martha Clan, MD   50 mcg at 01/09/23 1850   [MAR Hold] HYDROcodone-acetaminophen (NORCO/VICODIN) 5-325 MG per tablet 1-2 tablet  1-2 tablet Oral Q4H PRN Poggi, Marshall Cork, MD   2 tablet at 01/09/23 0041   [MAR Hold] insulin aspart (novoLOG) injection 0-15 Units  0-15 Units Subcutaneous Q4H Poggi, Marshall Cork, MD   2 Units at 01/09/23 0155   [MAR Hold] ipratropium-albuterol (DUONEB) 0.5-2.5 (3) MG/3ML nebulizer solution 3 mL  3 mL Nebulization TID Emeterio Reeve, DO   3 mL at 01/09/23 1843    ipratropium-albuterol (DUONEB) 0.5-2.5 (3) MG/3ML nebulizer solution 3 mL  3 mL Nebulization Q4H Martha Clan, MD       [MAR Hold] levothyroxine (SYNTHROID) tablet 50 mcg  50 mcg Oral Q0600 Corky Mull, MD   50 mcg at 01/09/23 0645   [MAR Hold] magnesium hydroxide (MILK OF MAGNESIA) suspension 30 mL  30 mL Oral Daily PRN Corky Mull, MD   30 mL at 01/08/23 0636   [MAR Hold] metoCLOPramide (REGLAN) tablet 5-10 mg  5-10 mg Oral Q8H PRN Poggi, Marshall Cork, MD       Or   [facility] Hold] metoCLOPramide (REGLAN) injection 5-10 mg  5-10 mg Intravenous Q8H PRN Poggi, Marshall Cork, MD       [MAR Hold] metroNIDAZOLE (FLAGYL) IVPB 500 mg  500 mg Intravenous Q8H Pabon, Diego F, MD 100 mL/hr at 01/09/23 1441 500 mg at 01/09/23 1441   [MAR Hold] morphine (PF) 2 MG/ML injection 2 mg  2 mg Intravenous Q2H PRN Poggi, Marshall Cork, MD       [MAR Hold] ondansetron [facility]) tablet 4 mg  4 mg Oral Q6H PRN Poggi, Marshall Cork, MD       Or   [facility] Hold] ondansetron (ZOFRAN) injection 4 mg  4 mg Intravenous Q6H PRN Poggi, Marshall Cork, MD       [MAR Hold] pantoprazole (PROTONIX) EC tablet 40 mg  40 mg Oral Daily Poggi, Marshall Cork, MD   40 mg at 01/08/23 0935   [MAR Hold] sodium phosphate (FLEET) 7-19 GM/118ML enema 1 enema  1 enema Rectal Once PRN Poggi, Marshall Cork, MD       [MAR Hold] Tbo-Filgrastim [facility]) injection 480 mcg  480 mcg Subcutaneous Daily Cammie Sickle, MD       [MAR Hold] Tenofovir Alafenamide Fumarate TABS 25 mg  25 mg Oral Daily Poggi, Marshall Cork, MD   25 mg at 01/08/23 0935   [MAR Hold] vancomycin (VANCOCIN) IVPB 1000 mg/200 mL  premix  1,000 mg Intravenous Q24H Coulter, Carolyn, [place]        PHYSICAL EXAMINATION:   Vitals:   01/09/23 1845 01/09/23 1900  BP: (!) 142/66   Pulse: 81 80  Resp: 20 15  Temp: 99.1 F (37.3 C)   SpO2: 95% 94%   Filed Weights   01/06/23 1917  Weight: 145 lb (65.8 kg)    Physical Exam Vitals and nursing note reviewed.  HENT:     Head: Normocephalic and atraumatic.     Mouth/Throat:      Pharynx: Oropharynx is clear.  Eyes:     Extraocular Movements: Extraocular movements intact.     Pupils: Pupils are equal, round, and reactive to light.  Cardiovascular:     Rate and Rhythm: Normal rate and regular rhythm.  Pulmonary:     Comments: Decreased breath sounds bilaterally.  Abdominal:     Palpations: Abdomen is soft.     Tenderness: There is abdominal tenderness.  Musculoskeletal:        General: Normal range of motion.     Cervical back: Normal range of motion.  Skin:    General: Skin is warm.  Neurological:     General: No focal deficit present.     Mental Status: She is alert and oriented to person, place, and time.  Psychiatric:        Behavior: Behavior normal.        Judgment: Judgment normal.     LABORATORY DATA:  I have reviewed the data as listed Lab Results  Component Value Date   WBC 1.0 (LL) 01/09/2023   HGB 9.3 (L) 01/09/2023   HCT 29.6 (L) 01/09/2023   MCV 86.3 01/09/2023   PLT 152 01/09/2023   Recent Labs    08/02/22 2106 08/04/22 0542 12/29/22 0900 01/06/23 1920 01/08/23 0342 01/09/23 0600  NA  --    < > 140 140 138 134*  K  --    < > 4.5 4.0 3.8 3.6  CL  --    < > 98 99 97* 98  CO2  --    < > 35* 34* 30 31  GLUCOSE  --    < > 212* 106* 294* 83  BUN  --    < > 28* 24* 36* 35*  CREATININE  --    < > 0.65 0.80 1.03* 0.85  CALCIUM  --    < > 9.4 8.5* 8.1* 7.7*  GFRNONAA  --    < > >60 >60 57* >60  PROT 5.4*   < > 5.0* 5.2*  --  4.9*  ALBUMIN 3.0*   < > 3.2* 2.9*  --  2.5*  AST 21   < > 14* 13*  --  13*  ALT 12   < > 14 11  --  8  ALKPHOS 126   < > 101 84  --  62  BILITOT 1.7*   < > 0.6 1.0  --  1.2  BILIDIR 0.5*  --   --   --   --   --   IBILI 1.2*  --   --   --   --   --    < > = values in this interval not displayed.    RADIOGRAPHIC STUDIES: I have personally reviewed the radiological images as listed and agreed with the findings in the report. DG ABD ACUTE 2+V W 1V CHEST  Result Date: 01/09/2023 CLINICAL DATA:  Cecal  volvulus EXAM: DG ABDOMEN  ACUTE WITH 1 VIEW CHEST COMPARISON:  CT from the previous day FINDINGS: Cardiac shadow is stable. Bilateral pleural effusions are again seen. Right chest wall port is again noted. Central consolidation/scarring in right lung is again seen. No pneumothorax is noted. No free air is seen. Persistent dilatation of the cecum and ascending colon is noted against highly suspicious for cecal volvulus. The overall appearance is similar to that seen on the previous day. Contrast is noted in the more proximal small bowel. No definitive distal colonic contrast is seen. IMPRESSION: Stable appearance of cecal volvulus. Bilateral pleural effusions and persistent changes in the right lung consistent of consolidation and scarring. Electronically Signed   By: Inez Catalina M.D.   On: 01/09/2023 11:24   CT ABDOMEN PELVIS W CONTRAST  Result Date: 01/08/2023 CLINICAL DATA:  Abdominal pain, acute, nonlocalized. Urinary retention. EXAM: CT ABDOMEN AND PELVIS WITH CONTRAST TECHNIQUE: Multidetector CT imaging of the abdomen and pelvis was performed using the standard protocol following bolus administration of intravenous contrast. RADIATION DOSE REDUCTION: This exam was performed according to the departmental dose-optimization program which includes automated exposure control, adjustment of the mA and/or kV according to patient size and/or use of iterative reconstruction technique. CONTRAST:  150mL OMNIPAQUE IOHEXOL 300 MG/ML  SOLN COMPARISON:  11/07/2022 FINDINGS: Lower chest: Bilateral pleural effusions, right larger than left, similar to prior study. Compressive atelectasis in the lower lobes. Heart is normal size. Hepatobiliary: At least 1 gallstone seen within the gallbladder measuring 1.5 cm. No focal hepatic abnormality or biliary ductal dilatation. Mild hepatomegaly with a craniocaudal length of 20 cm. Pancreas: No focal abnormality or ductal dilatation. Spleen: No focal abnormality. Splenomegaly with a  craniocaudal length of 16 cm. Adrenals/Urinary Tract: Foley catheter in the bladder which is decompressed. No renal or adrenal mass. No hydronephrosis. Stomach/Bowel: Dilated segment of large bowel in the right abdomen with air-fluid level and layering stool. On coronal imaging, there is a swirled appearance leading to this segment of large bowel. 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 Small bowel decompressed as is the stomach. Vascular/Lymphatic: No evidence of aneurysm or adenopathy. Aortic atherosclerosis. Reproductive: Uterus and adnexa unremarkable.  No mass. Other: Diffuse edema throughout the subcutaneous soft tissues compatible with anasarca. Small amount of free fluid in the abdomen and pelvis. Musculoskeletal: No acute bony abnormality. IMPRESSION: Dilated right colon with air-fluid level in the right upper abdomen. Swirled appearance noted on coronal imaging leading to this loop of bowel. 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 Hepatosplenomegaly. Small amount of free fluid in the abdomen and pelvis. Moderate to large bilateral effusions, right greater than left. These results will be called to the ordering clinician or representative by the Radiologist Assistant, and communication documented in the PACS or [employer]. Electronically Signed   By: Rolm Baptise M.D.   On: 01/08/2023 21:20   US RENAL  Result Date: 01/08/2023 CLINICAL DATA:  [id number] Urine retention [id number] EXAM: RENAL / URINARY TRACT ULTRASOUND COMPLETE COMPARISON:  CT 11/07/2022 FINDINGS: Right Kidney: Renal measurements: 8.2 x 3.7 x 3.6 cm = volume: 57.2 mL. No hydronephrosis. Mildly increased renal cortical echogenicity. Left Kidney: Renal measurements: 9.2 x 3.6 x 4.7 cm = volume: 81.3 mL. No hydronephrosis. Mildly increased renal cortical echogenicity. Bladder: Decompressed with Foley catheter in place. Other: Bilateral pleural effusions. IMPRESSION: No hydronephrosis. Mildly increased renal cortical echogenicity  bilaterally, as can be seen in medical renal disease. Bladder is decompressed with Foley catheter in place. Bilateral pleural effusions noted. Electronically Signed   By: Edison Nasuti  Chancy Milroy M.D.   On: 01/08/2023 18:13   DG Knee Complete 4 Views Left  Result Date: 01/06/2023 CLINICAL DATA:  Pain and swelling of left knee EXAM: LEFT KNEE - COMPLETE 4+ VIEW COMPARISON:  12/14/2022 FINDINGS: Frontal, bilateral oblique, lateral views of the left knee are obtained. No acute fracture, subluxation, or dislocation. Interval development of large left knee effusion. Stable 3 compartmental osteoarthritis. There is progressive diffuse subcutaneous edema surrounding the left knee and throughout the visualized left lower extremity. IMPRESSION: 1. Interval development of large left knee effusion. 2. Diffuse subcutaneous edema throughout the visualized left leg, which has progressed since prior study. 3. No acute bony abnormality. 4. Stable 3 compartmental osteoarthritis. Electronically Signed   By: Randa Ngo M.D.   On: 01/06/2023 22:32   DG Chest 2 View  Result Date: 12/15/2022 CLINICAL DATA:  Shortness of breath, increased EXAM: CHEST - 2 VIEW COMPARISON:  12/07/2022 FINDINGS: RIGHT jugular Port-A-Cath with tip projecting over cavoatrial junction. Enlargement of cardiac silhouette with pulmonary vascular congestion. Diffuse infiltrates consistent with pulmonary edema. Interval increase in BILATERAL pleural effusions and bibasilar atelectasis. No pneumothorax or acute osseous findings. IMPRESSION: Increased pulmonary edema and bibasilar pleural effusions/atelectasis. Electronically Signed   By: Lavonia Dana M.D.   On: 12/15/2022 16:27   DG Tibia/Fibula Left  Result Date: 12/14/2022 CLINICAL DATA:  Leg pain swelling EXAM: LEFT TIBIA AND FIBULA - 2 VIEW COMPARISON:  None Available. FINDINGS: No fracture or malalignment. No soft tissue emphysema. No periostitis or osseous destructive change. 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  Diffuse soft tissue swelling IMPRESSION: No acute osseous abnormality. 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 Electronically Signed   By: Donavan Foil M.D.   On: 12/14/2022 18:34   US Venous Img Lower Unilateral Left  Result Date: 12/14/2022 CLINICAL DATA:  left leg pain EXAM: LEFT LOWER EXTREMITY VENOUS DOPPLER ULTRASOUND TECHNIQUE: Gray-scale sonography with compression, as well as color and duplex ultrasound, were performed to evaluate the deep venous system(s) from the level of the common femoral vein through the popliteal and proximal calf veins. COMPARISON:  None Available. FINDINGS: VENOUS Normal compressibility of the common femoral, superficial femoral, and popliteal veins, as well as the visualized calf veins. Visualized portions of profunda femoral vein and great saphenous vein unremarkable. No filling defects to suggest DVT on grayscale or color Doppler imaging. Doppler waveforms show normal direction of venous flow, normal respiratory plasticity and response to augmentation. Limited views of the contralateral common femoral vein are unremarkable. OTHER Multiple lymph nodes in the groins noted. The largest measures approximately 4.2 x 1.3 cm in the left groin. IMPRESSION: No evidence of DVT in the left lower extremity. Electronically Signed   By: Margaretha Sheffield M.D.   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  # Cecal volvulus-causing symptomatic obstruction.  S/p evaluation with surgery.  # Marginal lymphoma-recurrent/progressive currently on rituximab-revlimid.  Further treatment on hold given acute issues see above.  # Severe neutropenia/anemia/thrombocytopenia-secondary to underlying marginal lymphoma/bone marrow  involvement.  Plan Granix given the severe neutropenia/ongoing infection/volvulus.  # Septic arthritis-left knee status post arthrocentesis on antibiotics.  # Acute retention on-Foley catheter.  # DNR/DNI  # recommendations:   # Reviewed with the patient the difficult situation that she is needing emergency surgery/colectomy for volvulus.  Also discussed the extremely high risk of complications  from surgery including infection/sepsis in the context of her immunocompromise state/underlying malignancy and ongoing infection and septic arthritis.  I also reached out to patient's daughter to discuss my concerns.  Unable to reach her left a voicemail for the daughter.  # In the context of multiple comorbidities-hospice would be recommended especially patient continues to decline clinically.  # Also discussed with Dr. Manuella Ghazi and Dr. Perrin Maltese.  Dr. Janese Banks will resume patient's care starting 3/18.   Addendum: After discussion with Dr.Pabon patient's family in agreement with proceeding with surgery.    Cammie Sickle, MD 01/09/2023 7:06 PM

## 2023-01-09 NOTE — Assessment & Plan Note (Signed)
Chronic respiratory failure with hypoxia Not acutely exacerbated Continue home inhalers with DuoNebs as needed On room air.

## 2023-01-09 NOTE — Plan of Care (Signed)
  Problem: Clinical Measurements: Goal: Ability to avoid or minimize complications of infection will improve Outcome: Progressing   Problem: Skin Integrity: Goal: Skin integrity will improve Outcome: Progressing   Problem: Education: Goal: Knowledge of General Education information will improve Description: Including pain rating scale, medication(s)/side effects and non-pharmacologic comfort measures Outcome: Progressing   Problem: Clinical Measurements: Goal: Ability to maintain clinical measurements within normal limits will improve Outcome: Progressing   Problem: Activity: Goal: Risk for activity intolerance will decrease Outcome: Progressing   Problem: Nutrition: Goal: Adequate nutrition will be maintained Outcome: Progressing   

## 2023-01-09 NOTE — Assessment & Plan Note (Signed)
Due to lymphoma.  Oncology consult.  Granix ordered by oncology

## 2023-01-09 NOTE — Progress Notes (Signed)
Progress Note   Patient: Natasha [name] [id number] DOB: [birth year] DOA: 01/06/2023     2 DOS: the patient was seen and examined on 01/09/2023   Brief hospital course: 77 y.o. female with medical history significant for type 2 diabetes mellitus, COPD on home O2 at 3 L, postherpetic neuralgia, tobacco use, diastolic CHF with recurrent pleural effusion, gastric lymphoma and well as anemia and thrombocytopenia, hospitalized from 2/20 to 12/17/2022 with left leg cellulitis who presents to the emergency room with complaint of urinary frequency and dysuria and a sensation of incomplete bladder emptying.  At the same time she complains of left knee pain and swelling   3/15: Left knee arthroscopic washout by Ortho, ID consult for suspected septic knee, Foley placed in the ED for acute urinary retention 3/16: US renal pending, plan for hospice at discharge at home, consult oncology for neutropenia 3/17: Had severe abdominal pain later in yesterday, required CT which showed volvulus.  Undergoing surgery -emergent laparotomy and ileostomy today  Assessment and Plan: Effusion, left knee Possible septic joint History of osteoarthritis both knees Continue ceftriaxone and vancomycin Status post left knee arthroscopy washout by Ortho on 3/15.  Joint fluid sent for diagnostics including cytology Pain control Hold Eliquis, seen on med list Follow synovial fluid Gram stain and culture  Urinary tract infection Acute urinary retention Foley catheter was placed in the ED Renal ultrasound did not show any hydronephrosis or pathology Continue Rocephin  Urinary retention Foley placed in ED, renal ultrasound does not show any hydronephrosis  Type 2 diabetes mellitus without complication, with long-term current use of insulin (HCC) Continue sliding scale insulin coverage.  Anemia of chronic disease Hemoglobin 9.3   Neutropenic fever (HCC) Tmax of 101.  WBC of 0.4-> 1.0-consult oncology.  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 Janese Banks.  Consult oncology.  Overall poor prognosis daughter in agreement with keeping her DNR and have hospice follow at discharge.  Chronic obstructive pulmonary disease (COPD) (HCC) Chronic respiratory failure with hypoxia Not acutely exacerbated Continue home inhalers with DuoNebs as needed On room air.  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  Surgery Dr. Adora Fridge has seen the patient in consultation and discussed with patient and her daughter who are wanting surgery even though understands this would be very high risk  Pancytopenia ([place]) Due to lymphoma.  Oncology consult.  Granix ordered by oncology  Goals of care, counseling/discussion Plan for hospice at home upon discharge.  Patient has 9 readmissions in the last 6 months and 2 ED visits per [facility] record.  Overall poor prognosis        Subjective: Patient sitting in the chair.  Wanting to undergo surgery for her volvulus  Physical Exam: Vitals:   01/09/23 0235 01/09/23 0250 01/09/23 0456 01/09/23 0822  BP: 116/63 108/62 124/66 (!) 110/55  Pulse: 85 85 75 82  Resp: 18 18 17 19   Temp: 98.6 F (37 C) 98.5 F (36.9 C) 98 F (36.7 C) 98.5 F (36.9 C)  TempSrc: Oral Oral Oral Oral  SpO2: 96% 98%  99%  Weight:      Height:       77 year old female sitting in the chair comfortably without any acute distress Lungs clear to auscultation bilaterally Heart regular rate and rhythm Abdomen distended, mild generalized tenderness, hypoactive bowel sounds Left knee:wrapped with ACE -  Hemovac with bloody drainage noted, no purulent material Extremities: Edema present in both lower extremities Data Reviewed:  CT scan of the abdomen showed cecal volvulus  Family Communication: Daughter was updated by Dr. Dahlia Byes to decide surgery plans for  volvulus  Disposition: Status is: Inpatient Remains inpatient appropriate because: Management of volvulus, pancytopenia, sepsis  Planned Discharge Destination: Home with Home Health and possible hospice   DVT prophylaxis-SCDs Time spent: 35 minutes  Author: Max Sane, MD 01/09/2023 11:15 AM  For on call review [email or web address].

## 2023-01-09 NOTE — Op Note (Signed)
PROCEDURES: Right colectomy , end ileostomy  and mucous fistula  Pre-operative Diagnosis:cecal volvulus  Post-operative Diagnosis: same  Surgeon: [place]   Assistants: Gladstone Lighter RNFA  Anesthesia: General endotracheal anesthesia  ASA Class: 4  Surgeon: Caroleen Hamman , MD FACS  Anesthesia: Gen. with endotracheal tube  Findings: Ascites 500cc Cecal volvulus , cecum measures 13 cm , serosal tear but no free perforation  Estimated Blood Loss: 50cc              Specimens: Right colon          Complications: none         Condition: stable  Procedure Details  The patient was seen again in the Holding Room. The benefits, complications, treatment options, and expected outcomes were discussed with the patient. The risks of bleeding, infection, recurrence of symptoms, failure to resolve symptoms,  bowel injury, any of which could require further surgery were reviewed with the patient.   The patient was taken to Operating Room, identified as Natasha [name] and the procedure verified.  A Time Out was held and the above information confirmed.  Prior to the induction of general anesthesia, antibiotic prophylaxis was administered. VTE prophylaxis was in place. General endotracheal anesthesia was then administered and tolerated well. After the induction, the abdomen was prepped with Chloraprep and draped in the sterile fashion. The patient was positioned in the supine position.  Generous midline laparotomy performed w a 10 blade and cautery used to dissect the sub q. Fascia elevated and fascia incised with metz. We entered the abdominal cavity. NO injuries observed. 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   We started by mobilizing the white line of taut lateral to medial.  We also identified the hepatic flexure and were able to divided with LigaSure device.  Was able to detorsed the cecum.  The distal margin of resection was found within the transverse colon.  A  window within the mesentery was created and the large bowel was divided with 75 GIA stapler.  We were able to also select point in the distal terminal ileum 10 cm from the ileocecal valve and divided the  bowel with a 75 mm GIA.  Mesentery was divided with LigaSure and the right colic pedicle was suture-ligated with a 2-0 silk in the standard fashion.  There was excellent hemostasis. Given pt fragility I felt prudent to perform end ileostomy and MF. Two defects on the upper abd wall were created and the fascia incised, To the right we exteriorize the TI , to the left we exteriorize the transverse colon  19 Blake drain was placed in the RUQ.  The drain was sutured in place with a 3-0 nylon. A second look showed no evidence of any bleeding or any other injuries. We closed the   abdomen with a -0 PDS suture in a running fashion and the skin was closed with staples. Liposomal Marcaine  was injected on all incision sites under direct visualization. Both ostomies were matured using multiple 3-0 vicryls on a brook fashion.   Needle and laparotomy count were correct and there were no immediate complications  Caroleen Hamman, MD, FACS

## 2023-01-09 NOTE — Assessment & Plan Note (Signed)
Plan for hospice at home upon discharge.  Patient has 9 readmissions in the last 6 months and 2 ED visits per [facility] record.  Overall poor prognosis

## 2023-01-09 NOTE — Assessment & Plan Note (Signed)
Possible septic joint History of osteoarthritis both knees Continue ceftriaxone and vancomycin Status post left knee arthroscopy washout by Ortho on 3/15.  Joint fluid sent for diagnostics including cytology Pain control Hold Eliquis, seen on med list Follow synovial fluid Gram stain and culture

## 2023-01-09 NOTE — Transfer of Care (Signed)
Immediate Anesthesia Transfer of Care Note  Patient: Prentice Docker Kilgore  Procedure(s) Performed: EXPLORATORY LAPAROTOMY AND RIGHT COLECTOMY  Patient Location: PACU  Anesthesia Type:General  Level of Consciousness: awake, alert , and oriented  Airway & Oxygen Therapy: Patient Spontanous Breathing and Patient connected to face mask oxygen  Post-op Assessment: Report given to RN and Post -op Vital signs reviewed and stable  Post vital signs: Reviewed and stable  Last Vitals:  Vitals Value Taken Time  BP 129/61 01/09/23 1830  Temp    Pulse 81 01/09/23 1832  Resp 16 01/09/23 1832  SpO2 95 % 01/09/23 1832  Vitals shown include unvalidated device data.  Last Pain:  Vitals:   01/09/23 0822  TempSrc: Oral  PainSc: 3          Complications: No notable events documented.

## 2023-01-09 NOTE — Anesthesia Preprocedure Evaluation (Signed)
Anesthesia Evaluation  Patient identified by MRN, date of birth, ID band Patient awake    Reviewed: Allergy & Precautions, NPO status , Patient's Chart, lab work & pertinent test results  History of Anesthesia Complications Negative for: history of anesthetic complications  Airway Mallampati: III  TM Distance: >3 FB Neck ROM: full    Dental  (+) Chipped, Upper Dentures, Missing, Poor Dentition, Dental Advidsory Given   Pulmonary shortness of breath and with exertion, pneumonia, resolved, COPD,  oxygen dependent, neg recent URI, former smoker 3l [place]    Pulmonary exam normal        Cardiovascular Exercise Tolerance: Poor (-) hypertension(-) angina + Peripheral Vascular Disease and +CHF  (-) CAD, (-) Past MI and (-) Cardiac Stents Normal cardiovascular exam(-) dysrhythmias (-) Valvular Problems/Murmurs  06/2022 Echo 1. Left ventricular ejection fraction, by estimation, is 60 to 65%. The  left ventricle has normal function. The left ventricle has no regional  wall motion abnormalities. Left ventricular diastolic parameters were  normal.   2. Right ventricular systolic function is normal. The right ventricular  size is normal. There is mildly elevated pulmonary artery systolic  pressure.   3. The mitral valve is normal in structure. Mild mitral valve  regurgitation. No evidence of mitral stenosis.   4. Tricuspid valve regurgitation is mild to moderate.   5. The aortic valve has an indeterminant number of cusps. There is mild  thickening of the aortic valve. Aortic valve regurgitation is not  visualized. Aortic valve sclerosis is present, with no evidence of aortic  valve stenosis.   6. The inferior vena cava is normal in size with greater than 50%  respiratory variability, suggesting right atrial pressure of 3 mmHg.     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 No date: CHF (congestive heart failure) (HCC) No date: COPD (chronic obstructive pulmonary disease) (HCC) No date: Diabetes mellitus without complication (HCC) No date: Hypotension No date: Pleural effusion     Comment:  ARMc 831ml,  2 weeks ago No date: Vaginal delivery     Comment:  x 5  Past Surgical History: 08/16/2022: IR IMAGING GUIDED PORT INSERTION No date: TONSILLECTOMY; Bilateral     Comment:  as a child  BMI    Body Mass Index: 24.13 kg/m      Reproductive/Obstetrics negative OB ROS                             Anesthesia Physical Anesthesia Plan  ASA: 4  Anesthesia Plan: General   Post-op Pain Management:    Induction: Intravenous  PONV Risk Score and Plan: Dexamethasone, Ondansetron and Treatment may vary due to age or medical condition  Airway Management Planned: Oral ETT  Additional Equipment:   Intra-op Plan:   Post-operative Plan: Extubation in OR and Possible Post-op intubation/ventilation  Informed Consent: I have reviewed the patients History and Physical, chart, labs and discussed the procedure including the risks, benefits and alternatives for the proposed anesthesia with the patient or authorized representative who has indicated  his/her understanding and acceptance.     Dental Advisory Given  Plan Discussed with: Anesthesiologist, CRNA and Surgeon  Anesthesia Plan Comments: (Patient consented for risks of anesthesia including but not limited to:  - adverse reactions to medications - damage to eyes, teeth, lips or other oral mucosa - nerve damage due to positioning  - sore throat or  hoarseness - Damage to heart, brain, nerves, lungs, other parts of body or loss of life  Patient voiced understanding.)       Anesthesia Quick Evaluation

## 2023-01-09 NOTE — Anesthesia Postprocedure Evaluation (Signed)
Anesthesia Post Note  Patient: Natasha [name]  Procedure(s) Performed: EXPLORATORY LAPAROTOMY AND RIGHT COLECTOMY  Patient location during evaluation: PACU Anesthesia Type: General Level of consciousness: awake and alert Pain management: pain level controlled Vital Signs Assessment: post-procedure vital signs reviewed and stable Respiratory status: spontaneous breathing, nonlabored ventilation, respiratory function stable and patient connected to nasal cannula oxygen Cardiovascular status: blood pressure returned to baseline and stable Postop Assessment: no apparent nausea or vomiting Anesthetic complications: no   No notable events documented.   Last Vitals:  Vitals:   01/09/23 1845 01/09/23 1900  BP: (!) 142/66   Pulse: 81 80  Resp: 20 15  Temp: 37.3 C   SpO2: 95% 94%    Last Pain:  Vitals:   01/09/23 1900  TempSrc:   PainSc: Asleep                 Martha Clan

## 2023-01-10 ENCOUNTER — Encounter: Payer: Self-pay | Admitting: Surgery

## 2023-01-10 DIAGNOSIS — R319 Hematuria, unspecified: Secondary | ICD-10-CM

## 2023-01-10 DIAGNOSIS — N390 Urinary tract infection, site not specified: Secondary | ICD-10-CM | POA: Diagnosis not present

## 2023-01-10 DIAGNOSIS — K562 Volvulus: Secondary | ICD-10-CM | POA: Diagnosis not present

## 2023-01-10 DIAGNOSIS — C8300 Small cell B-cell lymphoma, unspecified site: Secondary | ICD-10-CM | POA: Diagnosis not present

## 2023-01-10 DIAGNOSIS — D709 Neutropenia, unspecified: Secondary | ICD-10-CM | POA: Diagnosis not present

## 2023-01-10 DIAGNOSIS — M25462 Effusion, left knee: Secondary | ICD-10-CM | POA: Diagnosis not present

## 2023-01-10 LAB — CBC WITH DIFFERENTIAL/PLATELET
Abs Immature Granulocytes: 0 10*3/uL (ref 0.00–0.07)
Basophils Absolute: 0 10*3/uL (ref 0.0–0.1)
Basophils Relative: 2 %
Eosinophils Absolute: 0 10*3/uL (ref 0.0–0.5)
Eosinophils Relative: 2 %
HCT: 36.1 % (ref 36.0–46.0)
Hemoglobin: 11.2 g/dL — ABNORMAL LOW (ref 12.0–15.0)
Immature Granulocytes: 0 %
Lymphocytes Relative: 38 %
Lymphs Abs: 0.2 10*3/uL — ABNORMAL LOW (ref 0.7–4.0)
MCH: 26.5 pg (ref 26.0–34.0)
MCHC: 31 g/dL (ref 30.0–36.0)
MCV: 85.5 fL (ref 80.0–100.0)
Monocytes Absolute: 0.2 10*3/uL (ref 0.1–1.0)
Monocytes Relative: 48 %
Neutro Abs: 0.1 10*3/uL — CL (ref 1.7–7.7)
Neutrophils Relative %: 10 %
Platelets: 211 10*3/uL (ref 150–400)
RBC: 4.22 MIL/uL (ref 3.87–5.11)
RDW: 15.7 % — ABNORMAL HIGH (ref 11.5–15.5)
Smear Review: NORMAL
WBC: 0.5 10*3/uL — CL (ref 4.0–10.5)
nRBC: 0 % (ref 0.0–0.2)

## 2023-01-10 LAB — GLUCOSE, CAPILLARY
Glucose-Capillary: 192 mg/dL — ABNORMAL HIGH (ref 70–99)
Glucose-Capillary: 198 mg/dL — ABNORMAL HIGH (ref 70–99)
Glucose-Capillary: 221 mg/dL — ABNORMAL HIGH (ref 70–99)
Glucose-Capillary: 223 mg/dL — ABNORMAL HIGH (ref 70–99)
Glucose-Capillary: 254 mg/dL — ABNORMAL HIGH (ref 70–99)
Glucose-Capillary: 255 mg/dL — ABNORMAL HIGH (ref 70–99)

## 2023-01-10 LAB — BASIC METABOLIC PANEL
Anion gap: 11 (ref 5–15)
BUN: 33 mg/dL — ABNORMAL HIGH (ref 8–23)
CO2: 28 mmol/L (ref 22–32)
Calcium: 7.5 mg/dL — ABNORMAL LOW (ref 8.9–10.3)
Chloride: 98 mmol/L (ref 98–111)
Creatinine, Ser: 0.79 mg/dL (ref 0.44–1.00)
GFR, Estimated: >60 mL/min (ref >60)
Glucose, Bld: 203 mg/dL — ABNORMAL HIGH (ref 70–99)
Potassium: 4.3 mmol/L (ref 3.5–5.1)
Sodium: 137 mmol/L (ref 135–145)

## 2023-01-10 LAB — CBC
HCT: 35.8 % — ABNORMAL LOW (ref 36.0–46.0)
Hemoglobin: 11.2 g/dL — ABNORMAL LOW (ref 12.0–15.0)
MCH: 26.6 pg (ref 26.0–34.0)
MCHC: 31.3 g/dL (ref 30.0–36.0)
MCV: 85 fL (ref 80.0–100.0)
Platelets: 198 10*3/uL (ref 150–400)
RBC: 4.21 MIL/uL (ref 3.87–5.11)
RDW: 15.5 % (ref 11.5–15.5)
WBC: 0.5 10*3/uL — CL (ref 4.0–10.5)
nRBC: 0 % (ref 0.0–0.2)

## 2023-01-10 LAB — BODY FLUID CULTURE W GRAM STAIN: Culture: NO GROWTH

## 2023-01-10 LAB — IGG, IGA, IGM
IgA: 14 mg/dL — ABNORMAL LOW (ref 64–422)
IgG (Immunoglobin G), Serum: 195 mg/dL — ABNORMAL LOW (ref 586–1602)
IgM (Immunoglobulin M), Srm: <5 mg/dL — ABNORMAL LOW (ref 26–217)

## 2023-01-10 MED ORDER — ALBUMIN HUMAN 25 % IV SOLN
25.0000 g | Freq: Once | INTRAVENOUS | Status: AC
  Administered 2023-01-10: 25 g via INTRAVENOUS
  Filled 2023-01-10: qty 100

## 2023-01-10 MED ORDER — IPRATROPIUM-ALBUTEROL 0.5-2.5 (3) MG/3ML IN SOLN: 3.0000 mL | RESPIRATORY_TRACT | Status: DC | PRN

## 2023-01-10 MED ORDER — METRONIDAZOLE 500 MG/100ML IV SOLN
500.0000 mg | Freq: Two times a day (BID) | INTRAVENOUS | Status: DC
  Administered 2023-01-10 – 2023-01-13 (×6): 500 mg via INTRAVENOUS
  Filled 2023-01-10 (×6): qty 100

## 2023-01-10 NOTE — Assessment & Plan Note (Signed)
Due to lymphoma.  Oncology seen, status post Granix.  Overall poor prognosis

## 2023-01-10 NOTE — Plan of Care (Signed)
  Problem: Clinical Measurements: Goal: Ability to avoid or minimize complications of infection will improve Outcome: Progressing   Problem: Skin Integrity: Goal: Skin integrity will improve Outcome: Progressing   Problem: Education: Goal: Knowledge of General Education information will improve Description: Including pain rating scale, medication(s)/side effects and non-pharmacologic comfort measures Outcome: Progressing   Problem: Clinical Measurements: Goal: Ability to maintain clinical measurements within normal limits will improve Outcome: Progressing   Problem: Activity: Goal: Risk for activity intolerance will decrease Outcome: Progressing   Problem: Nutrition: Goal: Adequate nutrition will be maintained Outcome: Progressing

## 2023-01-10 NOTE — Progress Notes (Signed)
Manufacturing engineer [facility]) Liaison Note  Follow up on new referral for hospice services pending discharge from [facility].    Continued collaboration with patient/family and hospital staff through final disposition.  Please call with any hospice related questions or concerns.  Thank you for allowing participation in this patient's care.  Dimas Aguas, RN Nurse Liaison [phone]

## 2023-01-10 NOTE — Consult Note (Signed)
[place] Nurse ostomy consult note WOC consult requested for ileostomy and mucous fistula; surgery was performed yesterday.  Our team will begin teaching sessions tomorrow.  Thank-you,  Julien Girt MSN, [place], [place], [place], [place]

## 2023-01-10 NOTE — Assessment & Plan Note (Addendum)
Acute urinary retention Foley catheter was placed in the ED.  Will try to have it removed today Renal ultrasound did not show any hydronephrosis or pathology Continue Rocephin

## 2023-01-10 NOTE — Assessment & Plan Note (Signed)
Plan for hospice at home upon discharge.  Patient has 9 readmissions in the last 6 months and 2 ED visits per [facility] record.  Overall poor prognosis.

## 2023-01-10 NOTE — TOC Progression Note (Signed)
Transition of Care [facility]) - Progression Note    Patient Details  Name: Natasha [name] MRN: [id number] Date of Birth: [birth year]  Transition of Care [facility]) CM/SW [place], RN Phone Number: 01/10/2023, 8:09 AM  Clinical Narrative:     From home Therapy to work with the patient when she is able, TOC to follow for needs and assit with DC planning  Expected Discharge Plan: Home w Hospice Care Barriers to Discharge: Continued Medical Work up  Expected Discharge Plan and Services       Living arrangements for the past 2 months: Single Family Home                                       Social Determinants of Health (SDOH) Interventions [place]: No Food Insecurity (01/07/2023)  Housing: Low Risk  (01/07/2023)  Transportation Needs: Unmet Transportation Needs (01/07/2023)  Utilities: Not At Risk (01/07/2023)  Alcohol Screen: Low Risk  (08/24/2022)  Depression (PHQ2-9): Low Risk  (08/24/2022)  Financial Resource Strain: Medium Risk (08/24/2022)  Physical Activity: Inactive (08/24/2022)  Social Connections: Socially Isolated (08/24/2022)  Stress: Stress Concern Present (08/24/2022)  Tobacco Use: Medium Risk (01/10/2023)    Readmission Risk Interventions    11/08/2022   10:53 AM 10/21/2022    1:13 PM 08/17/2022   11:05 AM  Readmission Risk Prevention Plan  Transportation Screening Complete Complete Complete  PCP or Specialist Appt within 3-5 Days   Complete  HRI or [place]   Complete  Social Work Consult for [place] Planning/Counseling   [place]   Not Applicable  Medication Review Press photographer) Complete Complete Complete  PCP or Specialist appointment within 3-5 days of discharge Complete Complete   HRI or [place] Complete Complete   SW Recovery Care/Counseling Consult Complete Complete   Palliative Care Screening Complete Not [place] Not  Applicable Not Applicable

## 2023-01-10 NOTE — Assessment & Plan Note (Signed)
Chronic respiratory failure with hypoxia Not acutely exacerbated Continue home inhalers with DuoNebs as needed On room air.

## 2023-01-10 NOTE — Assessment & Plan Note (Signed)
Foley placed in ED, renal ultrasound does not show any hydronephrosis.  Remove Foley today

## 2023-01-10 NOTE — Progress Notes (Signed)
Pharmacy Antibiotic Note  Natasha [name] is a 77 y.o. female admitted on 01/06/2023 with cellulitis. PMH significant for T2DM, COPD on 3L, postherpetic neuralgia, TUD, dCHF with recent pleural effusion, gastric lymphoma, anemia, thrombocytopenia, osteoarthritis of both knees. Recent hospitalization 12/14/22 with L leg cellulitis- discharged on cephalexin. Returned to ED again 01/06/23 with L knee pain and swelling with effusion concerning for septic joint as well as urinary symptoms and UA concerning for UTI. Pharmacy has been consulted for vancomycin dosing for septic joint.  S/p arthroscopic washout for septic joint performed by ortho on 3/15. On 3/16, patient experienced new abdominal pain. CT was obtained which showed cecal volvulus and metronidazole was added to antibiotic regimen. Surgery was consulted and performed emergent laparotomy and ileostomy on 3/17.   Plan: Continue vancomycin IV 1,000 mg IV Q24H Goal AUC 400-600 Expected AUC: 530, Cmin 12.3 SCr used: 0.79, Weight used: IBW, Vd used: 0.72   Patient is also on ceftriaxone IV 2 grams Q24H and metronidazole IV 500 mg Q12H  Follow up renal function for dose adjustments and culture results.  Height: 5\' 5"  (165.1 cm) Weight: 65.8 kg (145 lb) IBW/kg (Calculated) : 57  Temp (24hrs), Avg:98.9 F (37.2 C), Min:98.3 F (36.8 C), Max:99.1 F (37.3 C)  Recent Labs  Lab 01/06/23 1920 01/08/23 0342 01/09/23 0600 01/10/23 0347  WBC 1.7* 0.4* 1.0* 0.5*  CREATININE 0.80 1.03* 0.85 0.79     Estimated Creatinine Clearance: 54.7 mL/min (by C-G formula based on SCr of 0.79 mg/dL).    No Known Allergies  Antimicrobials this admission: 3/14 Ceftriaxone  >>  3/15 Vancomycin  >>  3/16 Metronidazole >>  Dose adjustments this admission: 3/17 Vancomycin 1250 mg IV Q24H >> Vancomycin 1000 mg IV Q24H  Microbiology results: 3/15 BCx: NG3D 3/14 Knee Fluid Cx: NG3D 3/15 L Knee Synovial Fluid Cx: NG3D 3/14 UA: pyuria  Thank you for  allowing pharmacy to be a part of this patient's care.  Glean Salvo, PharmD, BCPS Clinical Pharmacist  01/10/2023 10:47 AM

## 2023-01-10 NOTE — Progress Notes (Signed)
POD # 1 Doing ok considering her physiologic reserve Taking clears Ostomy working AVSS  PE debilitated chronically ill Abd: soft, dressing intact, ostomies pink and patent. Ileostomy working, serous fluid from drain Ext: edema up to the thighs  A/P Mobilize Liberalize diet PT Ostomy nurse consult

## 2023-01-10 NOTE — Progress Notes (Signed)
PT Cancellation Note  Patient Details Name: Natasha [name] MRN: [id number] DOB: 05/31/1946   Cancelled Treatment:    Reason Eval/Treat Not Completed: Pain limiting ability to participate. Pt's chart reviewed. X2 attempts made today coordinating pain meds. Pt declining participation each time reporting she was told by the MD she is not allowed to get up. PT and RN educating pt there are no mobility restrictions for her recent surgery. Pt continuing to decline. PT to re-attempt as able.    Salem Caster. Fairly IV, PT, DPT Physical Therapist- [facility]  01/10/2023, 1:20 PM

## 2023-01-10 NOTE — Assessment & Plan Note (Signed)
H&H is stable

## 2023-01-10 NOTE — Assessment & Plan Note (Signed)
Tmax of 101.  WBC of 0.4-> 0.5-consult oncology.  Granix given yesterday

## 2023-01-10 NOTE — Plan of Care (Signed)
  Problem: Skin Integrity: Goal: Skin integrity will improve Outcome: Progressing   

## 2023-01-10 NOTE — Care Management Important Message (Signed)
Important Message  Patient Details  Name: Natasha [name] MRN: [id number] Date of Birth: November 20, 1945   Medicare Important Message Given:  Other (see comment)  Disposition to discharge with hospice services.  Medicare IM withheld at this time out of respect for patient and family.    Dannette Barbara 01/10/2023, 10:32 AM

## 2023-01-10 NOTE — Consult Note (Signed)
   [facility] Inpatient Consult   01/10/2023  Shakeara Font Vora December 08, 1945 [id number]  Orientation with Natividad Brood, [place] [facility] Liaison for review.   Location: [facility] Liaison Completed a remote screening [facility]).   [place] [facility]) [place] Patient: Nature conservation officer)    Primary Care Provider:  [name], NP with [facility] at [facility].  Patient (pt) screened with a 30 day hospitalization readmission with noted extreme high risk score  and 9 IP and 2 ED visits in 6 months. Pt  currently active with [facility] RN for care coordination services.     Plan:  [facility] hospital liaison will alert active [facility] RN care coordinator of pt's hospitalization and continue to follow ongoing disposition for any new issues for post hospital community care coordination/management needs.     [place] does not replace or interfere with any arrangements made by the Inpatient Transition of Care team.   For questions contact:    Raina Mina, RN, [place] Hours M-F 8:00 am to 5 pm [phone] mobile [phone] [Office toll free line][facility] Office Hours are M-F 8:30 - 5 pm 24 hour nurse advise line [phone] Conceirge  [email or web address]

## 2023-01-10 NOTE — Progress Notes (Signed)
Date and time results received: 01/10/23 12:30  Test: Labs Critical Value: NEUT #  Name of Provider Notified: 0.1  Orders Received? Or Actions Taken?:  MD Manuella Ghazi notified

## 2023-01-10 NOTE — Progress Notes (Signed)
Subjective: 1 Day Post-Op Procedure(s) (LRB): EXPLORATORY LAPAROTOMY AND RIGHT COLECTOMY (N/A) Patient reports minimal pain in the left knee. Patient recently developed a Volvulus, underwent surgery yesterday. PT and care management to assist with discharge planning. Negative for chest pain and shortness of breath Fever: No recent fevers.   Objective: Vital signs in last 24 hours: Temp:  [98.3 F (36.8 C)-99.1 F (37.3 C)] 99.1 F (37.3 C) (03/18 0356) Pulse Rate:  [80-90] 90 (03/18 0356) Resp:  [15-21] 18 (03/18 0356) BP: (110-147)/(55-75) 139/75 (03/18 0356) SpO2:  [72 %-99 %] 95 % (03/18 0356)  Intake/Output from previous day:  Intake/Output Summary (Last 24 hours) at 01/10/2023 0737 Last data filed at 01/10/2023 0700 Gross per 24 hour  Intake 988.16 ml  Output 1555 ml  Net -566.84 ml    Intake/Output this shift: No intake/output data recorded.  Labs: Recent Labs    01/08/23 0342 01/09/23 0600 01/10/23 0347  HGB 7.7* 9.3* 11.2*   Recent Labs    01/09/23 0600 01/10/23 0347  WBC 1.0* 0.5*  RBC 3.43* 4.21  HCT 29.6* 35.8*  PLT 152 198   Recent Labs    01/09/23 0600 01/10/23 0347  NA 134* 137  K 3.6 4.3  CL 98 98  CO2 31 28  BUN 35* 33*  CREATININE 0.85 0.79  GLUCOSE 83 203*  CALCIUM 7.7* 7.5*   Recent Labs    01/08/23 2200  INR 1.4*     EXAM General - Patient is Alert, Appropriate, and Oriented Extremity - Neurovascular intact Sensation intact distally Dorsiflexion/Plantar flexion intact Compartment soft Dressing/Incision - ACE wrap intact to the left knee. Minimal bloody drainage without purulence noted to the deep dressings. No erythema or purulent drainage noted to the left knee. Motor Function - intact, moving foot and toes well on exam.    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   CHF (congestive heart failure) (HCC)    COPD (chronic obstructive  pulmonary disease) (HCC)    Diabetes mellitus without complication (HCC)    Hypotension    Pleural effusion    [facility] 844ml,  2 weeks ago   Vaginal delivery    x 5    Assessment/Plan: 1 Day Post-Op Procedure(s) (LRB): EXPLORATORY LAPAROTOMY AND RIGHT COLECTOMY (N/A) Active Problems:   Primary osteoarthritis of both knees   Chronic obstructive pulmonary disease (COPD) (HCC)   Anemia of chronic disease   Goals of care, counseling/discussion   Nodal marginal zone B-cell lymphoma (HCC)   Chronic respiratory failure with hypoxia (HCC)   Type 2 diabetes mellitus without complication, with long-term current use of insulin (HCC)   Neutropenic fever (HCC)   Pancytopenia (HCC)   Urinary retention   Effusion, left knee   Urinary tract infection   Cecal volvulus (HCC)  Estimated body mass index is 24.13 kg/m as calculated from the following:   Height as of this encounter: 5\' 5"  (1.651 m).   Weight as of this encounter: 65.8 kg. Advance diet Up with therapy D/C IV fluids when tolerating po intake.  Labs reviewed this AM, WBC 0.5 this morning.  Hg 11.2 this morning. Mild bloody drainage noted to the lateral knee, less than yesterday. Cultures from aspiration and surgery without growth on culture at this time. Recent abdominal surgery with general surgery. Up with PT if tolerated today.  DVT Prophylaxis - TED hose Weight-Bearing as tolerated to  left leg  J. Cameron Proud, PA-C [facility] Orthopaedic Surgery 01/10/2023, 7:37 AM

## 2023-01-10 NOTE — Assessment & Plan Note (Signed)
Confirmed on a CT scan 3/16.  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  Surgery Dr. Dahlia Byes took her to the OR on 3/17 and has undergone colectomy, end ileostomy and mucous fistula On Flagyl, vancomycin and Rocephin for now

## 2023-01-10 NOTE — Assessment & Plan Note (Signed)
Continue sliding scale insulin coverage ?

## 2023-01-10 NOTE — Progress Notes (Signed)
Progress Note   Patient: Annalyn Binz Borg [id number] DOB: [birth year] DOA: 01/06/2023     3 DOS: the patient was seen and examined on 01/10/2023   Brief hospital course: 77 y.o. female with medical history significant for type 2 diabetes mellitus, COPD on home O2 at 3 L, postherpetic neuralgia, tobacco use, diastolic CHF with recurrent pleural effusion, gastric lymphoma and well as anemia and thrombocytopenia, hospitalized from 2/20 to 12/17/2022 with left leg cellulitis who presents to the emergency room with complaint of urinary frequency and dysuria and a sensation of incomplete bladder emptying.  At the same time she complains of left knee pain and swelling   3/15: Left knee arthroscopic washout by Ortho, ID consult for suspected septic knee, Foley placed in the ED for acute urinary retention 3/16: US renal pending, plan for hospice at discharge at home, consult oncology for neutropenia 3/17: Had severe abdominal pain later in yesterday, required CT which showed volvulus.  Status post right colectomy, end ileostomy and mucous fistula  3/18: Added Flagyl and diet  Assessment and Plan: Effusion, left knee Possible septic joint History of osteoarthritis both knees Continue ceftriaxone and vancomycin, Flagyl added Status post left knee arthroscopy washout by Ortho on 3/15.  Joint fluid sent for diagnostics including cytology Pain control Hold Eliquis, seen on med list Follow synovial fluid Gram stain and culture.  So far negative  Urinary tract infection Acute urinary retention Foley catheter was placed in the ED.  Will try to have it removed today Renal ultrasound did not show any hydronephrosis or pathology Continue Rocephin  Urinary retention Foley placed in ED, renal ultrasound does not show any hydronephrosis.  Remove Foley today  Type 2 diabetes mellitus without complication, with long-term current use of insulin (HCC) Continue sliding scale insulin coverage.  Anemia of chronic  disease H&H is stable   Neutropenic fever (HCC) Tmax of 101.  WBC of 0.4-> 0.5-consult oncology.  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 Janese Banks at the cancer center.  Dr. Rogue Bussing had seen her yesterday.  Overall poor prognosis daughter in agreement with keeping her DNR and have hospice follow at discharge.  Chronic obstructive pulmonary disease (COPD) (HCC) Chronic respiratory failure with hypoxia Not acutely exacerbated Continue home inhalers with DuoNebs as needed On room air.  Cecal volvulus ([place]) Confirmed on a CT scan 3/16.  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  Surgery Dr. Dahlia Byes took her to the OR on 3/17 and has undergone colectomy, end ileostomy and mucous fistula On Flagyl, vancomycin and Rocephin for now  Pancytopenia [facility]) Due to lymphoma.  Oncology seen, status post Granix.  Overall poor prognosis  Goals of care, counseling/discussion Plan for hospice at home upon discharge.  Patient has 9 readmissions in the last 6 months and 2 ED visits per [facility] record.  Overall poor prognosis.        Subjective: Sitting in the bed having pain in the abdomen mainly around the surgical area.  Physical Exam: Vitals:   01/10/23 0008 01/10/23 0356 01/10/23 0700 01/10/23 0822  BP: (!) 147/70 139/75  137/69  Pulse: 89 90 89 92  Resp: 18 18 18 18   Temp: 98.8 F (37.1 C) 99.1 F (37.3 C)  98.6 F (37 C)  TempSrc:      SpO2: 98% 95% 95% 95%  Weight:      Height:  77 year old female sitting in the bed reporting pain Lungs clear to auscultation bilaterally Heart regular rate and rhythm Abdomen soft, ileostomy in place dressing shows no signs of infection Left knee:wrapped with ACE - Hemovac with bloody drainage noted, no purulent material Extremities: Edema present in both lower extremities Data Reviewed:  WBC 8.5  Family Communication: Daughter  updated over phone  Disposition: Status is: Inpatient Remains inpatient appropriate because: Status post end ileostomy for cecal volvulus.  Diet started today to make sure tolerance  Planned Discharge Destination:  Home with hospice   DVT prophylaxis-SCDs Time spent: 35 minutes  Author: Max Sane, MD 01/10/2023 11:11 AM  For on call review [email or web address].

## 2023-01-10 NOTE — Progress Notes (Signed)
Date of Admission:  01/06/2023     ID: Natasha [name] is a 77 y.o. female  Active Problems:   Primary osteoarthritis of both knees   Chronic obstructive pulmonary disease (COPD) ([place])   Anemia of chronic disease   Goals of care, counseling/discussion   Nodal marginal zone B-cell lymphoma (HCC)   Chronic respiratory failure with hypoxia ([place])   Type 2 diabetes mellitus without complication, with long-term current use of insulin (HCC)   Neutropenic fever (HCC)   Pancytopenia (HCC)   Urinary retention   Effusion, left knee   Urinary tract infection   Cecal volvulus (HCC) Natasha [name] is a 77 y.o. female with a history of nodal marginal  B cell lymphoma, rt pleural effusion due to lymphoma, extensive lymphadenopathy, bone marrow involvement, hemorrhagic shock with PRBC in Jan 2024 presents with difficulty in passing urine- apparently having some trouble for the past week-but she had been to her PCP on 01/05/2023 and did not mention about that.  She had gone that to get her refills on her insulin.  But patient says  her left knee  was very painful when she woke up and was swollen, Between 12/14/2022 until 12/17/2022 for left leg cellulitis and was treated with antibiotics.   Pt had cecal volvulus over weekend and underwent surgey on 01/09/23 Subjective: Says she has some pain at the site of surgery Says the left knee pain is not an issue  Medications:   acetaminophen  1,000 mg Oral Q6H   Chlorhexidine Gluconate Cloth  6 each Topical Daily   docusate sodium  100 mg Oral BID   insulin aspart  0-15 Units Subcutaneous Q4H   levothyroxine  50 mcg Oral Q0600   pantoprazole  40 mg Oral Daily   Tbo-filgastrim (GRANIX) SQ  480 mcg Subcutaneous Daily   Tenofovir Alafenamide Fumarate  25 mg Oral Daily    Objective: Vital signs in last 24 hours: Patient Vitals for the past 24 hrs:  BP Temp Temp src Pulse Resp SpO2  01/10/23 0822 137/69 98.6 F (37 C) -- 92 18 95 %  01/10/23 0700 -- -- -- 89  18 95 %  01/10/23 0356 139/75 99.1 F (37.3 C) -- 90 18 95 %  01/10/23 0008 (!) 147/70 98.8 F (37.1 C) -- 89 18 98 %  01/09/23 2020 (!) 141/69 98.3 F (36.8 C) Axillary 86 18 98 %  01/09/23 1900 -- -- -- 80 15 94 %  01/09/23 1845 (!) 142/66 99.1 F (37.3 C) -- 81 20 95 %  01/09/23 1830 129/61 -- -- 84 19 (!) 73 %  01/09/23 1827 131/61 99.1 F (37.3 C) -- 85 (!) 21 (!) 72 %  01/09/23 1505 (!) 110/58 99 F (37.2 C) Oral 88 18 98 %       PHYSICAL EXAM:  General: Alert, cooperative, no distress. Lungs: b/l air entry. Heart: Regular rate and rhythm, no murmur, rub or gallop. Port in place Abdomen: Laparotomy site covered with dressing Has a ileostomy There is a mucous fistula Extremities: Left knee knee covered with dressing. Skin: No rashes or lesions. Or bruising Lymph: Cervical, supraclavicular normal. Neurologic: Grossly non-focal  Lab Results Recent Labs    01/09/23 0600 01/10/23 0347  WBC 1.0* 0.5*  HGB 9.3* 11.2*  HCT 29.6* 35.8*  NA 134* 137  K 3.6 4.3  CL 98 98  CO2 31 28  BUN 35* 33*  CREATININE 0.85 0.79   Liver Panel Recent Labs  01/09/23 0600  PROT 4.9*  ALBUMIN 2.5*  AST 13*  ALT 8  ALKPHOS 62  BILITOT 1.2    Microbiology: 01/06/2023 Synovial fluid culture no growth 01/07/2023 blood culture no growth 01/07/2023 synovial fluid culture no growth Studies/Results: DG ABD ACUTE 2+V W 1V CHEST  Result Date: 01/09/2023 CLINICAL DATA:  Cecal volvulus EXAM: DG ABDOMEN ACUTE WITH 1 VIEW CHEST COMPARISON:  CT from the previous day FINDINGS: Cardiac shadow is stable. Bilateral pleural effusions are again seen. Right chest wall port is again noted. Central consolidation/scarring in right lung is again seen. No pneumothorax is noted. No free air is seen. Persistent dilatation of the cecum and ascending colon is noted against highly suspicious for cecal volvulus. The overall appearance is similar to that seen on the previous day. Contrast is noted in the  more proximal small bowel. No definitive distal colonic contrast is seen. IMPRESSION: Stable appearance of cecal volvulus. Bilateral pleural effusions and persistent changes in the right lung consistent of consolidation and scarring. Electronically Signed   By: Inez Catalina M.D.   On: 01/09/2023 11:24   CT ABDOMEN PELVIS W CONTRAST  Result Date: 01/08/2023 CLINICAL DATA:  Abdominal pain, acute, nonlocalized. Urinary retention. EXAM: CT ABDOMEN AND PELVIS WITH CONTRAST TECHNIQUE: Multidetector CT imaging of the abdomen and pelvis was performed using the standard protocol following bolus administration of intravenous contrast. RADIATION DOSE REDUCTION: This exam was performed according to the departmental dose-optimization program which includes automated exposure control, adjustment of the mA and/or kV according to patient size and/or use of iterative reconstruction technique. CONTRAST:  136mL OMNIPAQUE IOHEXOL 300 MG/ML  SOLN COMPARISON:  11/07/2022 FINDINGS: Lower chest: Bilateral pleural effusions, right larger than left, similar to prior study. Compressive atelectasis in the lower lobes. Heart is normal size. Hepatobiliary: At least 1 gallstone seen within the gallbladder measuring 1.5 cm. No focal hepatic abnormality or biliary ductal dilatation. Mild hepatomegaly with a craniocaudal length of 20 cm. Pancreas: No focal abnormality or ductal dilatation. Spleen: No focal abnormality. Splenomegaly with a craniocaudal length of 16 cm. Adrenals/Urinary Tract: Foley catheter in the bladder which is decompressed. No renal or adrenal mass. No hydronephrosis. Stomach/Bowel: Dilated segment of large bowel in the right abdomen with air-fluid level and layering stool. On coronal imaging, there is a swirled appearance leading to this segment of large bowel. 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 Small bowel decompressed as is the stomach. Vascular/Lymphatic: No evidence of aneurysm or adenopathy. Aortic  atherosclerosis. Reproductive: Uterus and adnexa unremarkable.  No mass. Other: Diffuse edema throughout the subcutaneous soft tissues compatible with anasarca. Small amount of free fluid in the abdomen and pelvis. Musculoskeletal: No acute bony abnormality. IMPRESSION: Dilated right colon with air-fluid level in the right upper abdomen. Swirled appearance noted on coronal imaging leading to this loop of bowel. 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 Hepatosplenomegaly. Small amount of free fluid in the abdomen and pelvis. Moderate to large bilateral effusions, right greater than left. These results will be called to the ordering clinician or representative by the Radiologist Assistant, and communication documented in the PACS or [employer]. Electronically Signed   By: Rolm Baptise M.D.   On: 01/08/2023 21:20     Assessment/Plan: Left knee swelling acutely Concern for septic arthritis,  Synovitic fluid culture no growth  no fluid cytology sent to look for malignant cells Underwent I&D.  Surgical cultures no growth  Nodal marginal Bcell lymphoma with extensive involvement LN, bone marrow, Pleural effusion- on rituximab, lenalidomide  Neutropenia  combination of Lymphoma with marrow involovement- ?rituximab ( can cause leucopenia)  Cecal volvulus.  Status post right colectomy, ileostomy and mucous fistula creation On ceftriaxone + flagyl  Urinary retention-  foley   Chronic Edema legs ? Anemia  patient is now hospice care. So on discharge we may be able to do Amox/clav for 2 weeks Discussed the management with patient and hospitalist

## 2023-01-10 NOTE — Assessment & Plan Note (Signed)
Stage IV followed by Dr. Janese Banks at the cancer center.  Dr. Rogue Bussing had seen her yesterday.  Overall poor prognosis daughter in agreement with keeping her DNR and have hospice follow at discharge.

## 2023-01-10 NOTE — Assessment & Plan Note (Signed)
Possible septic joint History of osteoarthritis both knees Continue ceftriaxone and vancomycin, Flagyl added Status post left knee arthroscopy washout by Ortho on 3/15.  Joint fluid sent for diagnostics including cytology Pain control Hold Eliquis, seen on med list Follow synovial fluid Gram stain and culture.  So far negative

## 2023-01-11 ENCOUNTER — Inpatient Hospital Stay: Payer: 59

## 2023-01-11 ENCOUNTER — Inpatient Hospital Stay: Payer: 59 | Admitting: Nurse Practitioner

## 2023-01-11 DIAGNOSIS — C8300 Small cell B-cell lymphoma, unspecified site: Secondary | ICD-10-CM | POA: Diagnosis not present

## 2023-01-11 DIAGNOSIS — D709 Neutropenia, unspecified: Secondary | ICD-10-CM | POA: Diagnosis not present

## 2023-01-11 DIAGNOSIS — R339 Retention of urine, unspecified: Secondary | ICD-10-CM | POA: Diagnosis not present

## 2023-01-11 DIAGNOSIS — D638 Anemia in other chronic diseases classified elsewhere: Secondary | ICD-10-CM

## 2023-01-11 DIAGNOSIS — D61818 Other pancytopenia: Secondary | ICD-10-CM | POA: Diagnosis not present

## 2023-01-11 DIAGNOSIS — M25462 Effusion, left knee: Secondary | ICD-10-CM | POA: Diagnosis not present

## 2023-01-11 DIAGNOSIS — N390 Urinary tract infection, site not specified: Secondary | ICD-10-CM | POA: Diagnosis not present

## 2023-01-11 LAB — CBC
HCT: 33.2 % — ABNORMAL LOW (ref 36.0–46.0)
Hemoglobin: 10.2 g/dL — ABNORMAL LOW (ref 12.0–15.0)
MCH: 26.4 pg (ref 26.0–34.0)
MCHC: 30.7 g/dL (ref 30.0–36.0)
MCV: 85.8 fL (ref 80.0–100.0)
Platelets: 190 10*3/uL (ref 150–400)
RBC: 3.87 MIL/uL (ref 3.87–5.11)
RDW: 15.8 % — ABNORMAL HIGH (ref 11.5–15.5)
WBC: 1.4 10*3/uL — CL (ref 4.0–10.5)
nRBC: 0 % (ref 0.0–0.2)

## 2023-01-11 LAB — GLUCOSE, CAPILLARY
Glucose-Capillary: 104 mg/dL — ABNORMAL HIGH (ref 70–99)
Glucose-Capillary: 133 mg/dL — ABNORMAL HIGH (ref 70–99)
Glucose-Capillary: 136 mg/dL — ABNORMAL HIGH (ref 70–99)
Glucose-Capillary: 172 mg/dL — ABNORMAL HIGH (ref 70–99)
Glucose-Capillary: 175 mg/dL — ABNORMAL HIGH (ref 70–99)
Glucose-Capillary: 194 mg/dL — ABNORMAL HIGH (ref 70–99)

## 2023-01-11 LAB — BPAM RBC
Blood Product Expiration Date: 202403202359
Blood Product Expiration Date: 202403222359
ISSUE DATE / TIME: 202403170005
ISSUE DATE / TIME: 202403170229
Unit Type and Rh: 5100
Unit Type and Rh: 9500

## 2023-01-11 LAB — TYPE AND SCREEN
ABO/RH(D): O POS
Antibody Screen: NEGATIVE
Unit division: 0
Unit division: 0

## 2023-01-11 LAB — BASIC METABOLIC PANEL
Anion gap: 8 (ref 5–15)
BUN: 24 mg/dL — ABNORMAL HIGH (ref 8–23)
CO2: 31 mmol/L (ref 22–32)
Calcium: 7.8 mg/dL — ABNORMAL LOW (ref 8.9–10.3)
Chloride: 99 mmol/L (ref 98–111)
Creatinine, Ser: 0.73 mg/dL (ref 0.44–1.00)
GFR, Estimated: >60 mL/min (ref >60)
Glucose, Bld: 132 mg/dL — ABNORMAL HIGH (ref 70–99)
Potassium: 3.7 mmol/L (ref 3.5–5.1)
Sodium: 138 mmol/L (ref 135–145)

## 2023-01-11 MED ORDER — LOPERAMIDE HCL 2 MG/15ML PO SOLN
1.0000 mg | Freq: Two times a day (BID) | ORAL | Status: DC
  Administered 2023-01-11 – 2023-01-14 (×7): 1 mg via ORAL
  Filled 2023-01-11 (×8): qty 7.5

## 2023-01-11 NOTE — Assessment & Plan Note (Signed)
H&H is stable

## 2023-01-11 NOTE — Assessment & Plan Note (Signed)
Acute urinary retention Foley catheter was placed in the ED.  removed on 3/18 Renal ultrasound did not show any hydronephrosis or pathology Continue Rocephin

## 2023-01-11 NOTE — Assessment & Plan Note (Signed)
Stage IV followed by Dr. Janese Banks at the cancer center.  Overall poor prognosis daughter in agreement with keeping her DNR and have hospice follow at discharge.

## 2023-01-11 NOTE — Assessment & Plan Note (Signed)
Continue sliding scale insulin coverage ?

## 2023-01-11 NOTE — Assessment & Plan Note (Signed)
Confirmed on a CT scan 3/16.  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  Surgery Dr. Dahlia Byes took her to the OR on 3/17 and has undergone colectomy, end ileostomy and mucous fistula.  Started soft diet  On Flagyl, vancomycin and Rocephin for now.

## 2023-01-11 NOTE — Assessment & Plan Note (Signed)
Chronic respiratory failure with hypoxia Not acutely exacerbated Continue home inhalers with DuoNebs as needed On room air.

## 2023-01-11 NOTE — Assessment & Plan Note (Signed)
Possible septic joint History of osteoarthritis both knees Continue ceftriaxone and vancomycin, Flagyl added Status post left knee arthroscopy washout by Ortho on 3/15.  Joint fluid sent for diagnostics including cytology Pain control Hold Eliquis for now as she still had blood in joint drainage bulb.  Can resume if Ortho is okay Follow synovial fluid Gram stain and culture.  So far negative

## 2023-01-11 NOTE — TOC Progression Note (Signed)
Transition of Care [facility]) - Progression Note    Patient Details  Name: Natasha [name] MRN: [id number] Date of Birth: 11/21/1945  Transition of Care [facility]) CM/SW [place], RN Phone Number: 01/11/2023, 2:37 PM  Clinical Narrative:     TOC continues to follow, She will DC to home with Hospice   Expected Discharge Plan: Home w Hospice Care Barriers to Discharge: Continued Medical Work up  Expected Discharge Plan and Services       Living arrangements for the past 2 months: [place] Determinants of Health (SDOH) Interventions [place]: No Food Insecurity (01/07/2023)  Housing: Low Risk  (01/07/2023)  Transportation Needs: Unmet Transportation Needs (01/07/2023)  Utilities: Not At Risk (01/07/2023)  Alcohol Screen: Low Risk  (08/24/2022)  Depression (PHQ2-9): Low Risk  (08/24/2022)  Financial Resource Strain: Medium Risk (08/24/2022)  Physical Activity: Inactive (08/24/2022)  Social Connections: Socially Isolated (08/24/2022)  Stress: Stress Concern Present (08/24/2022)  Tobacco Use: Medium Risk (01/10/2023)    Readmission Risk Interventions    11/08/2022   10:53 AM 10/21/2022    1:13 PM 08/17/2022   11:05 AM  Readmission Risk Prevention Plan  Transportation Screening Complete Complete Complete  PCP or Specialist Appt within 3-5 Days   Complete  HRI or [place]   Complete  Social Work Consult for [place] Planning/Counseling   [place]   Not Applicable  Medication Review Press photographer) Complete Complete Complete  PCP or Specialist appointment within 3-5 days of discharge Complete Complete   HRI or [place] Complete Complete   SW Recovery Care/Counseling Consult Complete Complete   Palliative Care Screening Complete Not [place] Not Applicable Not Applicable

## 2023-01-11 NOTE — Assessment & Plan Note (Signed)
Plan for hospice at home upon discharge.  Patient has 9 readmissions in the last 6 months and 2 ED visits per [facility] record.  Overall poor prognosis.

## 2023-01-11 NOTE — Plan of Care (Signed)
  Problem: Skin Integrity: Goal: Skin integrity will improve Outcome: Progressing   

## 2023-01-11 NOTE — Progress Notes (Signed)
Subjective: 2 Days Post-Op Procedure(s) (LRB): EXPLORATORY LAPAROTOMY AND RIGHT COLECTOMY (N/A) Patient reports no pain in the left knee. Patient recently developed a Volvulus, underwent surgery for this. Patient is now on hospice care. Negative for chest pain and shortness of breath Fever: No recent fevers.   Objective: Vital signs in last 24 hours: Temp:  [98.7 F (37.1 C)-98.9 F (37.2 C)] 98.9 F (37.2 C) (03/19 0750) Pulse Rate:  [90-93] 90 (03/19 0750) Resp:  [16-18] 16 (03/19 0750) BP: (124-133)/(62-74) 128/74 (03/19 0750) SpO2:  [96 %-98 %] 97 % (03/19 0750)  Intake/Output from previous day:  Intake/Output Summary (Last 24 hours) at 01/11/2023 1029 Last data filed at 01/11/2023 0436 Gross per 24 hour  Intake 537.88 ml  Output 735 ml  Net -197.12 ml    Intake/Output this shift: No intake/output data recorded.  Labs: Recent Labs    01/09/23 0600 01/10/23 0344 01/10/23 0347 01/11/23 0449  HGB 9.3* 11.2* 11.2* 10.2*   Recent Labs    01/10/23 0347 01/11/23 0449  WBC 0.5* 1.4*  RBC 4.21 3.87  HCT 35.8* 33.2*  PLT 198 190   Recent Labs    01/10/23 0347 01/11/23 0449  NA 137 138  K 4.3 3.7  CL 98 99  CO2 28 31  BUN 33* 24*  CREATININE 0.79 0.73  GLUCOSE 203* 132*  CALCIUM 7.5* 7.8*   Recent Labs    01/08/23 2200  INR 1.4*     EXAM General - Patient is Alert, Appropriate, and Oriented Extremity - Neurovascular intact Sensation intact distally Dorsiflexion/Plantar flexion intact Compartment soft Dressing/Incision - ACE wrap intact to the left knee. Bloody drainage noted laterally. Fresh dressing and ACE wrap applied to the left knee. No erythema or purulent drainage noted to the left knee. Motor Function - intact, moving foot and toes well on exam.    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   CHF (congestive heart failure) (HCC)    COPD (chronic obstructive  pulmonary disease) (HCC)    Diabetes mellitus without complication (HCC)    Hypotension    Pleural effusion    ARMc 849ml,  2 weeks ago   Vaginal delivery    x 5    Assessment/Plan: 2 Days Post-Op Procedure(s) (LRB): EXPLORATORY LAPAROTOMY AND RIGHT COLECTOMY (N/A) Active Problems:   Primary osteoarthritis of both knees   Chronic obstructive pulmonary disease (COPD) (HCC)   Anemia of chronic disease   Goals of care, counseling/discussion   Nodal marginal zone B-cell lymphoma (HCC)   Chronic respiratory failure with hypoxia (HCC)   Type 2 diabetes mellitus without complication, with long-term current use of insulin (HCC)   Neutropenic fever (HCC)   Pancytopenia (HCC)   Urinary retention   Effusion of left knee   Urinary tract infection   Cecal volvulus (HCC)  Estimated body mass index is 24.13 kg/m as calculated from the following:   Height as of this encounter: 5\' 5"  (1.651 m).   Weight as of this encounter: 65.8 kg. Advance diet Up with therapy D/C IV fluids when tolerating po intake.  Labs reviewed this AM, WBC 1.4 this morning.  Hg 10.2 this morning. New dressing and ACE wrap applied today.  No signs of infection. Cultures from aspiration and surgery without growth on culture at this time. Recent abdominal surgery with general surgery. Up with PT if tolerated today. Plan is for  discharge with hospice  DVT Prophylaxis - TED hose Weight-Bearing as tolerated to left leg  J. Cameron Proud, PA-C [facility] Orthopaedic Surgery 01/11/2023, 10:29 AM

## 2023-01-11 NOTE — Progress Notes (Signed)
POD # 2 Doing ok reports she was not expecting to have 2 bags  Tolerating soft diet so far. Ileostomy working, with liquid stool. Mucous fistula pink. AVSS   PE debilitated chronically ill Abd: soft, honeycomb dressing intact trace of yellowish drainage inferior most portion of dressing, ostomies pink and patent. Ileostomy patent output, serous fluid from drain Ext: edema up to the thighs   A/P Mobilize Continue diet PT Ostomy nurse consult Add Imodium.

## 2023-01-11 NOTE — Assessment & Plan Note (Signed)
Foley placed in ED, renal ultrasound does not show any hydronephrosis.  Removed Foley on 3/18

## 2023-01-11 NOTE — Progress Notes (Signed)
[facility] ([facility])   [facility] HLT will continue to follow through discharge planning as patient has not been cleared medically for DC.    Please call with any questions/concerns.    Thank you for the opportunity to participate in this patient's care.   Phillis Haggis, MSW [facility] Liaison

## 2023-01-11 NOTE — Assessment & Plan Note (Signed)
Due to lymphoma.  Oncology seen, status post Granix.  Overall poor prognosis.

## 2023-01-11 NOTE — Plan of Care (Signed)
  Problem: Clinical Measurements: Goal: Ability to avoid or minimize complications of infection will improve Outcome: Progressing   Problem: Skin Integrity: Goal: Skin integrity will improve Outcome: Progressing   Problem: Activity: Goal: Risk for activity intolerance will decrease Outcome: Progressing   Problem: Nutrition: Goal: Adequate nutrition will be maintained Outcome: Progressing   Problem: Coping: Goal: Level of anxiety will decrease Outcome: Progressing

## 2023-01-11 NOTE — Consult Note (Addendum)
Natasha [name] Nurse ostomy consult note Stoma type/location:  LMQ mucous fistula RMQ ileostomy  Stomal assessment/size: Ileostomy 1 1/2" round edematous pink moist, mucous fistula 2 1/4" round edematous pink moist  Peristomal assessment: intact mucous fistula, mild erythema to ileostomy  Treatment options for stomal/peristomal skin: utilized stoma powder and no sting skin prep to crust around ileostomy then placed 2" barrier ring; 2" barrier ring alone to mucous fistula  Output 75 mls light yellow effluent from mucous fistula, 100 mls liquid brown stool from ileostomy  Ostomy pouching: 1pc. Flat flexible to mucous fistula Kellie Simmering [phone]); 2 piece 2 1/4" skin barrier Kellie Simmering [phone]) and 2 1/4" pouch Kellie Simmering [phone]) Education provided: Spoke to daughter over phone.  Daughter states she has had experience with ostomies in the past.  We discussed how ileostomies can be different in that the stool will be liquid and can become high output which could put patient at risk for dehydration.  Daughter states her daughters (patients granddaughters whom she lives with) are nurses.  Patient is also scheduled to have hospice at home after discharge.   Discussed with patient that the pouch needs to be emptied when 1/3 to 1/2 full.  Discussed how ileostomy output will be liquid, she has already been started on Imodium per surgery note.  We discussed that this may put her at risk for dehydration if her ostomy output is greater than her oral intake. We discussed how entire pouching system to ileostomy needs to be changed 2 times a week, mucous fistula may only need to be changed once a week depending on output and if skin barrier remains intact.  I demonstrated how to use the lock and roll mechanism to open pouch, clean spout with toilet paper wick and close pouch.  Patient deferred to practice this herself.  This RN demonstrated using push pull method to remove old pouch from both mucous fistula and ileostomy and size the stomas.  We  discussed that both stomas are edematous and will likely decrease in size over the next few weeks.  We did discuss sizing stomas with each pouch change for the next 4 weeks.  Demonstrated to patient cleaning around stomas with water moistened washcloth and discussed avoiding soaps, lotions or ointments around stoma. No baby wipes as these leave a residue.  I placed a 2" barrier ring around each stoma then demonstrated cutting new skin barriers.  I demonstrated how to attach pouch to skin barrier for 2 piece pouch utilized for ileostomy. Placed 1 piece pouch over mucous fistula and 2 piece pouch over ileostomy.    Went over educational materials left in room with patient.  Showed her the step by step written instructions for both 1 piece and 2 piece ostomy systems.  Patient had no questions at this time. Patient appeared very tired and was having abdominal pain during visit (nurse did provide po pain meds during the visit).   Enrolled patient in [place] program: Yes  5 sets supplies ordered for room: Kellie Simmering [id number] for mucous fistula, Kellie Simmering #[phone], [phone] for ileostomy and 2" barrier rings Kellie Simmering [phone]).   WOC will remain available to this patient and family for further ostomy education and support.   Thank you,    Shelton Silvas MSN, RN-BC, [employer] [phone]

## 2023-01-11 NOTE — Progress Notes (Signed)
Physical Therapy Treatment Patient Details Name: Natasha [name] MRN: [id number] DOB: Apr 15, 1946 Today's Date: 01/11/2023   History of Present Illness Natasha [name] is a 33yoF with medical history significant for type 2 diabetes mellitus, COPD on home O2 at 3 L, postherpetic neuralgia, tobacco use, diastolic CHF with recurrent pleural effusion, gastric lymphoma and well as anemia and thrombocytopenia, hospitalized from 2/20 to 12/17/2022 with left leg cellulitis who presents to the emergency room with complaint of urinary frequency and dysuria and a sensation of incomplete bladder emptying. Pt also c/o left knee pain and swelling 01/07/23 Left knee arthroscopic washout by ortho, ID consult for suspected septic knee, foley placed in the ED for acute urinary retention.    PT Comments    Pt asleep on entry awakes to voice. Pt is agreeable to limited session, still having a great deal of pain and nausea, but has not been OOB yet today. Pt motivated to get up to chair for a bit. Pt requires extensive assist with getting to EOB, but but from elevated EOB is able to rise minGuard assist with RW and safely transition to recliner. Pain is a major dictator of what movements and postures are allowed in session. Pt asks to defer any additional mobility thereafter. She asks to sit up tall in chair, let her feet down for a bit. O2 maintained throughout. Visitors enter as Chief Strategy Officer exits.     Recommendations for follow up therapy are one component of a multi-disciplinary discharge planning process, led by the attending physician.  Recommendations may be updated based on patient status, additional functional criteria and insurance authorization.  Follow Up Recommendations  Follow physician's recommendations for discharge plan and follow up therapies (pt now DC to home with hospice) Can patient physically be transported by private vehicle: No   Assistance Recommended at Discharge Intermittent Supervision/Assistance   Patient can return home with the following A lot of help with walking and/or transfers;A lot of help with bathing/dressing/bathroom;Assist for transportation   Equipment Recommendations  None recommended by PT;Other (comment) (defer to hospice liaison. \)    Recommendations for Other Services       Precautions / Restrictions Precautions Precautions: Fall Restrictions LLE Weight Bearing: Weight bearing as tolerated     Mobility  Bed Mobility Overal bed mobility: Needs Assistance Bed Mobility: Supine to Sit     Supine to sit: Max assist     General bed mobility comments: Multiple attempts from reclined bed thwarted by pain, nausea, but with HOB elevated even more, and with modA, pt able to come to EOB.    Transfers Overall transfer level: Needs assistance Equipment used: Rolling walker (2 wheels) Transfers: Sit to/from Stand Sit to Stand: Min guard, From elevated surface   Step pivot transfers: Min guard       General transfer comment: pt hesitant to fully errect, largely as antalgic measure for ABD pain.    Ambulation/Gait Ambulation/Gait assistance:  (deferred due to pain, nausea; pt elects to sit up in chair, feet down.)                 Stairs             Wheelchair Mobility    Modified Rankin (Stroke Patients Only)       Balance  Cognition Arousal/Alertness: Lethargic Behavior During Therapy: WFL for tasks assessed/performed Overall Cognitive Status: No family/caregiver present to determine baseline cognitive functioning                                          Exercises      General Comments        Pertinent Vitals/Pain Pain Assessment Pain Assessment: 0-10 Pain Score: 6  Pain Location: ABD pain Pain Descriptors / Indicators: Discomfort, Grimacing, Guarding, Sore Pain Intervention(s): Monitored during session, Limited activity within patient's  tolerance, Premedicated before session    Home Living                          Prior Function            PT Goals (current goals can now be found in the care plan section) Acute Rehab PT Goals Patient Stated Goal: to decrease pain PT Goal Formulation: With patient Time For Goal Achievement: 01/22/23 Potential to Achieve Goals: Good Progress towards PT goals: Not progressing toward goals - comment    Frequency    Min 2X/week      PT Plan Discharge plan needs to be updated    Co-evaluation              AM-PAC PT "6 Clicks" Mobility   Outcome Measure  Help needed turning from your back to your side while in a flat bed without using bedrails?: Total Help needed moving from lying on your back to sitting on the side of a flat bed without using bedrails?: Total Help needed moving to and from a bed to a chair (including a wheelchair)?: A Little Help needed standing up from a chair using your arms (e.g., wheelchair or bedside chair)?: A Little Help needed to walk in hospital room?: A Lot Help needed climbing 3-5 steps with a railing? : A Lot 6 Click Score: 12    End of Session Equipment Utilized During Treatment: Oxygen Activity Tolerance: Patient limited by pain;Patient tolerated treatment well Patient left: in chair;with call bell/phone within reach;with chair alarm set Nurse Communication: Mobility status PT Visit Diagnosis: Muscle weakness (generalized) (M62.81);Pain;Difficulty in walking, not elsewhere classified (R26.2);Other abnormalities of gait and mobility (R26.89) Pain - Right/Left: Left Pain - part of body: Knee     Time: [id number] PT Time Calculation (min) (ACUTE ONLY): 17 min  Charges:  $Therapeutic Activity: 8-22 mins                    3:51 PM, 01/11/23 [name], PT, DPT Physical Therapist - [facility]  [phone] ([place])    Uzoma Vivona C 01/11/2023, 3:48 PM

## 2023-01-11 NOTE — Progress Notes (Signed)
Hematology/Oncology Consult note [facility]  [phone] [phone]  Patient Care Team: [name], NP as PCP - General (Nurse Practitioner) [name], RN (Inactive) as Case Manager [name], RN as [place] Management   Name of the patient: Natasha [name]  [id number]  10/13/1946   Date of visit: @TODAY @  Diagnosis- ***  Chief complaint/ Reason for visit- ***  Heme/Onc history: ***  Interval history- ***  ECOG PS- *** Pain scale- *** Opioid associated constipation- ***  Review of systems- ROS    No Known Allergies   Past Medical History:  Diagnosis Date   Anemia    Cancer ([place])    lymphoma-stomach    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   CHF (congestive heart failure) (HCC)    COPD (chronic obstructive pulmonary disease) (HCC)    Diabetes mellitus without complication (HCC)    Hypotension    Pleural effusion    [facility] 832ml,  2 weeks ago   Vaginal delivery    x 5     Past Surgical History:  Procedure Laterality Date   INCISION AND DRAINAGE OF WOUND Left 01/07/2023   Procedure: ARTHROSCOPIC LEFT KNEE WASHOUT;  Surgeon: Corky Mull, MD;  Location: [facility] ORS;  Service: Orthopedics;  Laterality: Left;   IR IMAGING GUIDED PORT INSERTION  08/16/2022   LAPAROTOMY N/A 01/09/2023   Procedure: EXPLORATORY LAPAROTOMY AND RIGHT COLECTOMY;  Surgeon: Jules Husbands, MD;  Location: [facility] ORS;  Service: General;  Laterality: N/A;   TONSILLECTOMY Bilateral    as a child    Social History   Socioeconomic History   Marital status: Widowed    Spouse name: Not on file   Number of children: 2   Years of education: Not on file   Highest education level: 9th grade  Occupational History   Occupation: unemployed  Tobacco Use   Smoking status: Former    Packs/day: 2.00    Years: 55.00    Additional pack years: 0.00    Total pack years: 110.00    Types: Cigarettes    Start date: 07/07/1961    Quit  date: 08/02/2022    Years since quitting: 0.4   Smokeless tobacco: Never   Tobacco comments:    1/2 pack she states but family says 2 PPD  Vaping Use   Vaping Use: Never used  Substance and Sexual Activity   Alcohol use: No   Drug use: Never   Sexual activity: Not Currently    Partners: Male    Birth control/protection: Post-menopausal  Other Topics Concern   Not on file  Social History Narrative   08/14/20   From: [place]   Living: to be near daughter   Work: retired      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      Enjoys: stays at home      Exercise: walking to her daughter's store   Diet: eats fruit, air fried chicken      Safety   Seat belts: Yes    Guns: No   Safe in relationships: Yes    Social Determinants of Health   Financial Resource Strain: Medium Risk (08/24/2022)   Overall Financial Resource Strain (CARDIA)    Difficulty of Paying Living Expenses: Somewhat hard  Food Insecurity: No Food Insecurity (01/07/2023)   Hunger Vital Sign    Worried About Running Out of Food in  the Last Year: Never true    [place] in the Last Year: Never true  Transportation Needs: Unmet Transportation Needs (01/07/2023)   PRAPARE - Transportation    Lack of Transportation (Medical): Yes    Lack of Transportation (Non-Medical): Yes  Physical Activity: Inactive (08/24/2022)   Exercise Vital Sign    Days of Exercise per Week: 0 days    Minutes of Exercise per Session: 0 min  Stress: Stress Concern Present (08/24/2022)   [place]    Feeling of Stress : To some extent  Social Connections: Socially Isolated (08/24/2022)   Social Connection and Isolation Panel [NHANES]    Frequency of Communication with Friends and Family: More than three times a week    Frequency of Social Gatherings with Friends and Family: More than three times a week     Attends Religious Services: Never    Marine scientist or Organizations: No    Attends Archivist Meetings: Never    Marital Status: Widowed  Intimate Partner Violence: Not At Risk (01/07/2023)   Humiliation, Afraid, Rape, and Kick questionnaire    Fear of Current or Ex-Partner: No    Emotionally Abused: No    Physically Abused: No    Sexually Abused: No    Family History  Problem Relation Age of Onset   Cancer Mother    Breast cancer Mother    Cancer Father    Alzheimer's disease Father    Lung cancer Father        lung   Varicose Veins Brother    Heart attack Brother      Current Facility-Administered Medications:    0.9 %  sodium chloride infusion, , Intravenous, Continuous, Pabon, Diego F, MD, Stopped at 01/10/23 1042   acetaminophen (TYLENOL) tablet 1,000 mg, 1,000 mg, Oral, Q6H, Pabon, Diego F, MD, 1,000 mg at 01/11/23 1152   albuterol (PROVENTIL) (2.5 MG/3ML) 0.083% nebulizer solution 2.5 mg, 2.5 mg, Nebulization, Q2H PRN, Pabon, Diego F, MD   alum & mag hydroxide-simeth (MAALOX/MYLANTA) 200-200-20 MG/5ML suspension 30 mL, 30 mL, Oral, Q6H PRN, Pabon, Diego F, MD, 30 mL at 01/08/23 1220   bisacodyl (DULCOLAX) suppository 10 mg, 10 mg, Rectal, Daily PRN, Pabon, Diego F, MD   ceFAZolin (ANCEF) IVPB 2g/100 mL premix, 2 g, Intravenous, 30 min Pre-Op, Pabon, Diego F, MD   cefTRIAXone (ROCEPHIN) 2 g in sodium chloride 0.9 % 100 mL IVPB, 2 g, Intravenous, Q24H, Pabon, Diego F, MD, Last Rate: 200 mL/hr at 01/10/23 2143, 2 g at 01/10/23 2143   Chlorhexidine Gluconate Cloth 2 % PADS 6 each, 6 each, Topical, Daily, Pabon, Diego F, MD, 6 each at 01/11/23 0947   diphenhydrAMINE (BENADRYL) 12.5 MG/5ML elixir 12.5-25 mg, 12.5-25 mg, Oral, Q4H PRN, Pabon, Diego F, MD   docusate sodium (COLACE) capsule 100 mg, 100 mg, Oral, BID, Pabon, Diego F, MD, 100 mg at 01/11/23 0940   HYDROcodone-acetaminophen (NORCO/VICODIN) 5-325 MG per tablet 1-2 tablet, 1-2 tablet, Oral, Q4H PRN,  Pabon, Diego F, MD, 2 tablet at 01/09/23 0041   insulin aspart (novoLOG) injection 0-15 Units, 0-15 Units, Subcutaneous, Q4H, Pabon, Diego F, MD, 2 Units at 01/11/23 1152   ipratropium-albuterol (DUONEB) 0.5-2.5 (3) MG/3ML nebulizer solution 3 mL, 3 mL, Nebulization, Q4H PRN, Max Sane, MD   levothyroxine (SYNTHROID) tablet 50 mcg, 50 mcg, Oral, Q0600, Pabon, Diego F, MD, 50 mcg at 01/11/23 0510   loperamide HCl (IMODIUM) 2 MG/15ML solution  1 mg, 1 mg, Oral, BID, Ronny Bacon, MD, 1 mg at 01/11/23 1533   magnesium hydroxide (MILK OF MAGNESIA) suspension 30 mL, 30 mL, Oral, Daily PRN, Pabon, Diego F, MD, 30 mL at 01/08/23 0636   metoCLOPramide (REGLAN) tablet 5-10 mg, 5-10 mg, Oral, Q8H PRN **OR** metoCLOPramide (REGLAN) injection 5-10 mg, 5-10 mg, Intravenous, Q8H PRN, Pabon, Diego F, MD   metroNIDAZOLE (FLAGYL) IVPB 500 mg, 500 mg, Intravenous, Q12H, Wynelle Cleveland, RPH, Last Rate: 100 mL/hr at 01/11/23 0503, 500 mg at 01/11/23 0503   morphine (PF) 2 MG/ML injection 2 mg, 2 mg, Intravenous, Q2H PRN, Pabon, Diego F, MD, 2 mg at 01/10/23 2136   ondansetron (ZOFRAN) tablet 4 mg, 4 mg, Oral, Q6H PRN **OR** ondansetron (ZOFRAN) injection 4 mg, 4 mg, Intravenous, Q6H PRN, Pabon, Diego F, MD, 4 mg at 01/10/23 0548   oxyCODONE (Oxy IR/ROXICODONE) immediate release tablet 5 mg, 5 mg, Oral, Q4H PRN, Pabon, Diego F, MD, 5 mg at 01/11/23 0004   pantoprazole (PROTONIX) EC tablet 40 mg, 40 mg, Oral, Daily, Pabon, Diego F, MD, 40 mg at 01/11/23 0939   sodium phosphate (FLEET) 7-19 GM/118ML enema 1 enema, 1 enema, Rectal, Once PRN, Pabon, Diego F, MD   Tbo-Filgrastim [facility]) injection 480 mcg, 480 mcg, Subcutaneous, Daily, Pabon, [place] F, MD, 480 mcg at 01/11/23 0940   Tenofovir Alafenamide Fumarate TABS 25 mg, 25 mg, Oral, Daily, Pabon, [place] F, MD, 25 mg at 01/11/23 0940  Physical exam:  Vitals:   01/10/23 1519 01/11/23 0000 01/11/23 0750 01/11/23 1447  BP: 124/62 133/73 128/74 115/66  Pulse: 92 93  90 90  Resp: 18 17 16 16   Temp: 98.9 F (37.2 C) 98.7 F (37.1 C) 98.9 F (37.2 C) 98.3 F (36.8 C)  TempSrc:  Oral    SpO2: 96% 98% 97% 98%  Weight:      Height:       Physical Exam      Latest Ref Rng & Units 01/11/2023    4:49 AM  CMP  Glucose 70 - 99 mg/dL 132   BUN 8 - 23 mg/dL 24   Creatinine 0.44 - 1.00 mg/dL 0.73   Sodium 135 - 145 mmol/L 138   Potassium 3.5 - 5.1 mmol/L 3.7   Chloride 98 - 111 mmol/L 99   CO2 22 - 32 mmol/L 31   Calcium 8.9 - 10.3 mg/dL 7.8       Latest Ref Rng & Units 01/11/2023    4:49 AM  CBC  WBC 4.0 - 10.5 K/uL 1.4   Hemoglobin 12.0 - 15.0 g/dL 10.2   Hematocrit 36.0 - 46.0 % 33.2   Platelets 150 - 400 K/uL 190     @IMAGES @  DG ABD ACUTE 2+V W 1V CHEST  Result Date: 01/09/2023 CLINICAL DATA:  Cecal volvulus EXAM: DG ABDOMEN ACUTE WITH 1 VIEW CHEST COMPARISON:  CT from the previous day FINDINGS: Cardiac shadow is stable. Bilateral pleural effusions are again seen. Right chest wall port is again noted. Central consolidation/scarring in right lung is again seen. No pneumothorax is noted. No free air is seen. Persistent dilatation of the cecum and ascending colon is noted against highly suspicious for cecal volvulus. The overall appearance is similar to that seen on the previous day. Contrast is noted in the more proximal small bowel. No definitive distal colonic contrast is seen. IMPRESSION: Stable appearance of cecal volvulus. Bilateral pleural effusions and persistent changes in the right lung consistent of consolidation and scarring.  Electronically Signed   By: Inez Catalina M.D.   On: 01/09/2023 11:24   CT ABDOMEN PELVIS W CONTRAST  Result Date: 01/08/2023 CLINICAL DATA:  Abdominal pain, acute, nonlocalized. Urinary retention. EXAM: CT ABDOMEN AND PELVIS WITH CONTRAST TECHNIQUE: Multidetector CT imaging of the abdomen and pelvis was performed using the standard protocol following bolus administration of intravenous contrast. RADIATION DOSE  REDUCTION: This exam was performed according to the departmental dose-optimization program which includes automated exposure control, adjustment of the mA and/or kV according to patient size and/or use of iterative reconstruction technique. CONTRAST:  167mL OMNIPAQUE IOHEXOL 300 MG/ML  SOLN COMPARISON:  11/07/2022 FINDINGS: Lower chest: Bilateral pleural effusions, right larger than left, similar to prior study. Compressive atelectasis in the lower lobes. Heart is normal size. Hepatobiliary: At least 1 gallstone seen within the gallbladder measuring 1.5 cm. No focal hepatic abnormality or biliary ductal dilatation. Mild hepatomegaly with a craniocaudal length of 20 cm. Pancreas: No focal abnormality or ductal dilatation. Spleen: No focal abnormality. Splenomegaly with a craniocaudal length of 16 cm. Adrenals/Urinary Tract: Foley catheter in the bladder which is decompressed. No renal or adrenal mass. No hydronephrosis. Stomach/Bowel: Dilated segment of large bowel in the right abdomen with air-fluid level and layering stool. On coronal imaging, there is a swirled appearance leading to this segment of large bowel. 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 Small bowel decompressed as is the stomach. Vascular/Lymphatic: No evidence of aneurysm or adenopathy. Aortic atherosclerosis. Reproductive: Uterus and adnexa unremarkable.  No mass. Other: Diffuse edema throughout the subcutaneous soft tissues compatible with anasarca. Small amount of free fluid in the abdomen and pelvis. Musculoskeletal: No acute bony abnormality. IMPRESSION: Dilated right colon with air-fluid level in the right upper abdomen. Swirled appearance noted on coronal imaging leading to this loop of bowel. 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 Hepatosplenomegaly. Small amount of free fluid in the abdomen and pelvis. Moderate to large bilateral effusions, right greater than left. These results will be called to the ordering clinician or  representative by the Radiologist Assistant, and communication documented in the PACS or [employer]. Electronically Signed   By: Rolm Baptise M.D.   On: 01/08/2023 21:20   US RENAL  Result Date: 01/08/2023 CLINICAL DATA:  [id number] Urine retention [id number] EXAM: RENAL / URINARY TRACT ULTRASOUND COMPLETE COMPARISON:  CT 11/07/2022 FINDINGS: Right Kidney: Renal measurements: 8.2 x 3.7 x 3.6 cm = volume: 57.2 mL. No hydronephrosis. Mildly increased renal cortical echogenicity. Left Kidney: Renal measurements: 9.2 x 3.6 x 4.7 cm = volume: 81.3 mL. No hydronephrosis. Mildly increased renal cortical echogenicity. Bladder: Decompressed with Foley catheter in place. Other: Bilateral pleural effusions. IMPRESSION: No hydronephrosis. Mildly increased renal cortical echogenicity bilaterally, as can be seen in medical renal disease. Bladder is decompressed with Foley catheter in place. Bilateral pleural effusions noted. Electronically Signed   By: Maurine Simmering M.D.   On: 01/08/2023 18:13   DG Knee Complete 4 Views Left  Result Date: 01/06/2023 CLINICAL DATA:  Pain and swelling of left knee EXAM: LEFT KNEE - COMPLETE 4+ VIEW COMPARISON:  12/14/2022 FINDINGS: Frontal, bilateral oblique, lateral views of the left knee are obtained. No acute fracture, subluxation, or dislocation. Interval development of large left knee effusion. Stable 3 compartmental osteoarthritis. There is progressive diffuse subcutaneous edema surrounding the left knee and throughout the visualized left lower extremity. IMPRESSION: 1. Interval development of large left knee effusion. 2. Diffuse subcutaneous edema throughout the visualized left leg, which has progressed since prior study. 3. No acute  bony abnormality. 4. Stable 3 compartmental osteoarthritis. Electronically Signed   By: Randa Ngo M.D.   On: 01/06/2023 22:32   DG Chest 2 View  Result Date: 12/15/2022 CLINICAL DATA:  Shortness of breath, increased EXAM: CHEST - 2 VIEW COMPARISON:   12/07/2022 FINDINGS: RIGHT jugular Port-A-Cath with tip projecting over cavoatrial junction. Enlargement of cardiac silhouette with pulmonary vascular congestion. Diffuse infiltrates consistent with pulmonary edema. Interval increase in BILATERAL pleural effusions and bibasilar atelectasis. No pneumothorax or acute osseous findings. IMPRESSION: Increased pulmonary edema and bibasilar pleural effusions/atelectasis. Electronically Signed   By: Lavonia Dana M.D.   On: 12/15/2022 16:27   DG Tibia/Fibula Left  Result Date: 12/14/2022 CLINICAL DATA:  Leg pain swelling EXAM: LEFT TIBIA AND FIBULA - 2 VIEW COMPARISON:  None Available. FINDINGS: No fracture or malalignment. No soft tissue emphysema. No periostitis or osseous destructive change. 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 Diffuse soft tissue swelling IMPRESSION: No acute osseous abnormality. 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 Electronically Signed   By: Donavan Foil M.D.   On: 12/14/2022 18:34   US Venous Img Lower Unilateral Left  Result Date: 12/14/2022 CLINICAL DATA:  left leg pain EXAM: LEFT LOWER EXTREMITY VENOUS DOPPLER ULTRASOUND TECHNIQUE: Gray-scale sonography with compression, as well as color and duplex ultrasound, were performed to evaluate the deep venous system(s) from the level of the common femoral vein through the popliteal and proximal calf veins. COMPARISON:  None Available. FINDINGS: VENOUS Normal compressibility of the common femoral, superficial femoral, and popliteal veins, as well as the visualized calf veins. Visualized portions of profunda femoral vein and great saphenous vein unremarkable. No filling defects to suggest DVT on grayscale or color Doppler imaging. Doppler waveforms show normal direction of venous flow, normal respiratory plasticity and response to augmentation. Limited views of the contralateral common femoral vein are unremarkable. OTHER Multiple lymph nodes in the groins noted. The largest measures approximately 4.2 x 1.3 cm  in the left groin. IMPRESSION: No evidence of DVT in the left lower extremity. Electronically Signed   By: Margaretha Sheffield M.D.   On: 12/14/2022 18:22     Assessment and plan- Patient is a 77 y.o. female ***   Visit Diagnosis 1. Urinary retention   2. Lower urinary tract infectious disease   3. Effusion of left knee   4. Neutropenic fever ([place])      Dr. Randa Evens, MD, MPH [facility] at [facility] [id number] 01/11/2023 4:34 PM

## 2023-01-11 NOTE — Progress Notes (Signed)
Progress Note   Patient: Natasha [name] [id number] DOB: 1945/12/11 DOA: 01/06/2023     4 DOS: the patient was seen and examined on 01/11/2023   Brief hospital course: 76 y.o. female with medical history significant for type 2 diabetes mellitus, COPD on home O2 at 3 L, postherpetic neuralgia, tobacco use, diastolic CHF with recurrent pleural effusion, gastric lymphoma and well as anemia and thrombocytopenia, hospitalized from 2/20 to 12/17/2022 with left leg cellulitis who presents to the emergency room with complaint of urinary frequency and dysuria and a sensation of incomplete bladder emptying.  At the same time she complains of left knee pain and swelling   3/15: Left knee arthroscopic washout by Ortho, ID consult for suspected septic knee, Foley placed in the ED for acute urinary retention 3/16: US renal pending, plan for hospice at discharge at home, consult oncology for neutropenia 3/17: Had severe abdominal pain later in yesterday, required CT which showed volvulus.  Status post right colectomy, end ileostomy and mucous fistula  3/18: Added Flagyl and diet 3/19: Still having difficulty controlling her pain, WOC nurse planning on training for ileostomy education, not eating much  Assessment and Plan: Effusion of left knee Possible septic joint History of osteoarthritis both knees Continue ceftriaxone and vancomycin, Flagyl added Status post left knee arthroscopy washout by Ortho on 3/15.  Joint fluid sent for diagnostics including cytology Pain control Hold Eliquis for now as she still had blood in joint drainage bulb.  Can resume if Ortho is okay Follow synovial fluid Gram stain and culture.  So far negative  Urinary tract infection Acute urinary retention Foley catheter was placed in the ED.  removed on 3/18 Renal ultrasound did not show any hydronephrosis or pathology Continue Rocephin  Urinary retention Foley placed in ED, renal ultrasound does not show any hydronephrosis.   Removed Foley on 3/18  Type 2 diabetes mellitus without complication, with long-term current use of insulin (HCC) Continue sliding scale insulin coverage.  Anemia of chronic disease H&H is stable   Neutropenic fever (HCC) Fever curve improving.  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 Janese Banks at the cancer center.  Overall poor prognosis daughter in agreement with keeping her DNR and have hospice follow at discharge.  Chronic obstructive pulmonary disease (COPD) (HCC) Chronic respiratory failure with hypoxia Not acutely exacerbated Continue home inhalers with DuoNebs as needed On room air.  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  Surgery Dr. Dahlia Byes took her to the OR on 3/17 and has undergone colectomy, end ileostomy and mucous fistula.  Started soft diet  On Flagyl, vancomycin and Rocephin for now.  Pancytopenia ([place]) Due to lymphoma.  Oncology seen, status post Granix.  Overall poor prognosis.  Goals of care, counseling/discussion Plan for hospice at home upon discharge.  Patient has 9 readmissions in the last 6 months and 2 ED visits per [facility] record.  Overall poor prognosis.        Subjective: Reports pain at the surgical site.  Not able to eat much and feeling very weak  Physical Exam: Vitals:   01/10/23 0822 01/10/23 1519 01/11/23 0000 01/11/23 0750  BP: 137/69 124/62 133/73 128/74  Pulse: 92 92 93 90  Resp: 18 18 17 16   Temp: 98.6 F (37 C) 98.9 F (37.2 C) 98.7 F (37.1  C) 98.9 F (37.2 C)  TempSrc:   Oral   SpO2: 95% 96% 98% 97%  Weight:      Height:       77 year old female sitting in the bed reporting pain Lungs clear to auscultation bilaterally Heart regular rate and rhythm Abdomen soft, ileostomy in place draining liquid stool Left knee:wrapped with ACE -  Hemovac with bloody drainage noted Extremities: Edema present in both lower extremities Data Reviewed:  WBC 1.4  Family Communication: Daughter was updated over phone yesterday  Disposition: Status is: Inpatient Remains inpatient appropriate because: Management of postop pain and making sure she tolerates diet.  Ileostomy education by wound nurse.  Possible discharge in next 1 to 2 days on oral antibiotics/Augmentin for 2 weeks per ID  Planned Discharge Destination:  Home with hospice   DVT prophylaxis-SCDs Time spent: 35 minutes  Author: Max Sane, MD 01/11/2023 2:20 PM  For on call review [email or web address].

## 2023-01-11 NOTE — Assessment & Plan Note (Signed)
Fever curve improving.  ID recommends Augmentin for 2 weeks at discharge

## 2023-01-12 ENCOUNTER — Inpatient Hospital Stay: Payer: 59

## 2023-01-12 DIAGNOSIS — R5081 Fever presenting with conditions classified elsewhere: Secondary | ICD-10-CM | POA: Diagnosis not present

## 2023-01-12 DIAGNOSIS — Z515 Encounter for palliative care: Secondary | ICD-10-CM | POA: Diagnosis not present

## 2023-01-12 DIAGNOSIS — N390 Urinary tract infection, site not specified: Secondary | ICD-10-CM | POA: Diagnosis not present

## 2023-01-12 DIAGNOSIS — C8300 Small cell B-cell lymphoma, unspecified site: Secondary | ICD-10-CM | POA: Diagnosis not present

## 2023-01-12 DIAGNOSIS — R339 Retention of urine, unspecified: Secondary | ICD-10-CM | POA: Diagnosis not present

## 2023-01-12 DIAGNOSIS — K562 Volvulus: Secondary | ICD-10-CM | POA: Diagnosis not present

## 2023-01-12 DIAGNOSIS — D709 Neutropenia, unspecified: Secondary | ICD-10-CM | POA: Diagnosis not present

## 2023-01-12 DIAGNOSIS — J449 Chronic obstructive pulmonary disease, unspecified: Secondary | ICD-10-CM

## 2023-01-12 DIAGNOSIS — M25462 Effusion, left knee: Secondary | ICD-10-CM | POA: Diagnosis not present

## 2023-01-12 LAB — GLUCOSE, CAPILLARY
Glucose-Capillary: 107 mg/dL — ABNORMAL HIGH (ref 70–99)
Glucose-Capillary: 119 mg/dL — ABNORMAL HIGH (ref 70–99)
Glucose-Capillary: 121 mg/dL — ABNORMAL HIGH (ref 70–99)
Glucose-Capillary: 123 mg/dL — ABNORMAL HIGH (ref 70–99)
Glucose-Capillary: 129 mg/dL — ABNORMAL HIGH (ref 70–99)
Glucose-Capillary: 144 mg/dL — ABNORMAL HIGH (ref 70–99)
Glucose-Capillary: 94 mg/dL (ref 70–99)

## 2023-01-12 LAB — BASIC METABOLIC PANEL
Anion gap: 4 — ABNORMAL LOW (ref 5–15)
BUN: 20 mg/dL (ref 8–23)
CO2: 28 mmol/L (ref 22–32)
Calcium: 7.4 mg/dL — ABNORMAL LOW (ref 8.9–10.3)
Chloride: 104 mmol/L (ref 98–111)
Creatinine, Ser: 0.73 mg/dL (ref 0.44–1.00)
GFR, Estimated: >60 mL/min (ref >60)
Glucose, Bld: 83 mg/dL (ref 70–99)
Potassium: 4 mmol/L (ref 3.5–5.1)
Sodium: 136 mmol/L (ref 135–145)

## 2023-01-12 LAB — CBC
HCT: 32.9 % — ABNORMAL LOW (ref 36.0–46.0)
Hemoglobin: 9.8 g/dL — ABNORMAL LOW (ref 12.0–15.0)
MCH: 26.2 pg (ref 26.0–34.0)
MCHC: 29.8 g/dL — ABNORMAL LOW (ref 30.0–36.0)
MCV: 88 fL (ref 80.0–100.0)
Platelets: 195 10*3/uL (ref 150–400)
RBC: 3.74 MIL/uL — ABNORMAL LOW (ref 3.87–5.11)
RDW: 15.9 % — ABNORMAL HIGH (ref 11.5–15.5)
WBC: 1.9 10*3/uL — ABNORMAL LOW (ref 4.0–10.5)
nRBC: 0 % (ref 0.0–0.2)

## 2023-01-12 LAB — CULTURE, BLOOD (ROUTINE X 2)
Culture: NO GROWTH
Culture: NO GROWTH
Special Requests: ADEQUATE

## 2023-01-12 LAB — SURGICAL PATHOLOGY

## 2023-01-12 NOTE — Progress Notes (Signed)
POD#2 Doing well considering her physiology AVSS Tolerating PO and ostomy working  PE debilitated an chronically ill Abd: wound healing well, no infection , ostomies pink and patent  A/P Doing well considering her disease process Not much to add F/U w [place] in 10 days or so for staple removal

## 2023-01-12 NOTE — Progress Notes (Signed)
Progress Note   Patient: Natasha [name] [id number] DOB: 09-08-1946 DOA: 01/06/2023     5 DOS: the patient was seen and examined on 01/12/2023   Brief hospital course: 77 y.o. female with medical history significant for type 2 diabetes mellitus, COPD on home O2 at 3 L, postherpetic neuralgia, tobacco use, diastolic CHF with recurrent pleural effusion, gastric lymphoma and well as anemia and thrombocytopenia, hospitalized from 2/20 to 12/17/2022 with left leg cellulitis who presents to the emergency room with complaint of urinary frequency and dysuria and a sensation of incomplete bladder emptying.  At the same time she complains of left knee pain and swelling   3/15: Left knee arthroscopic washout by Ortho, ID consult for suspected septic knee, Foley placed in the ED for acute urinary retention 3/16: US renal pending, plan for hospice at discharge at home, consult oncology for neutropenia 3/17: Had severe abdominal pain later in yesterday, required CT which showed volvulus.  Status post right colectomy, end ileostomy and mucous fistula  3/18: Added Flagyl and diet 3/19: Still having difficulty controlling her pain, WOC nurse planning on training for ileostomy education, not eating much. 3/20: Continue to have abdominal pain.  Ileostomy is are working.  Palliative care was consulted by oncology as they were now recommending hospice care.   Assessment and Plan: Effusion of left knee Possible septic joint History of osteoarthritis both knees Continue ceftriaxone and vancomycin, Flagyl added Status post left knee arthroscopy washout by Ortho on 3/15.  Joint fluid sent for diagnostics including cytology Pain control Hold Eliquis for now as she still had blood in joint drainage bulb.  Can resume if Ortho is okay Follow synovial fluid Gram stain and culture.  So far negative  Urinary tract infection Acute urinary retention Foley catheter was placed in the ED.  removed on 3/18 Renal ultrasound  did not show any hydronephrosis or pathology Continue Rocephin  Urinary retention Foley placed in ED, renal ultrasound does not show any hydronephrosis.  Removed Foley on 3/18  Type 2 diabetes mellitus without complication, with long-term current use of insulin (HCC) Continue sliding scale insulin coverage.  Anemia of chronic disease H&H is stable   Neutropenic fever (HCC) Fever curve improving.  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 Janese Banks at the cancer center.  Overall poor prognosis daughter in agreement with keeping her DNR and have hospice follow at discharge.  Chronic obstructive pulmonary disease (COPD) (HCC) Chronic respiratory failure with hypoxia Not acutely exacerbated Continue home inhalers with DuoNebs as needed On room air.  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  Surgery Dr. Dahlia Byes took her to the OR on 3/17 and has undergone colectomy, end ileostomy and mucous fistula.  Started soft diet  On Flagyl, vancomycin and Rocephin for now.  Pancytopenia ([place]) Due to lymphoma.  Oncology seen, status post Granix.  Overall poor prognosis.  Goals of care, counseling/discussion Plan for hospice at home upon discharge.  Patient has 9 readmissions in the last 6 months and 2 ED visits per [facility] record.  Overall poor prognosis.        Subjective: Patient continued to feel significant abdominal pain.  Appetite and p.o. intake remained poor.  Feeling weak.  Physical Exam: Vitals:   01/11/23 1447 01/11/23 2339 01/12/23 0739 01/12/23 1639  BP: 115/66 124/67  126/71 117/65  Pulse: 90 91 90 87  Resp: 16 20 17 17   Temp: 98.3 F (36.8 C) 97.9 F (36.6 C) 99.2 F (37.3 C) 98.8 F (37.1 C)  TempSrc:      SpO2: 98% 98% 98% 97%  Weight:      Height:      General.  Chronically  ill-appearing elderly lady, in no acute distress. Pulmonary.  Lungs clear bilaterally, normal respiratory effort. CV.  Regular rate and rhythm, no JVD, rub or murmur. Abdomen.  Mild diffuse tenderness with 2 osteotomies in place.  CNS.  Alert and oriented .  No focal neurologic deficit. Extremities.  No edema, no cyanosis, pulses intact and symmetrical. Psychiatry.  Judgment and insight appears normal.   Data Reviewed: Prior data reviewed  Family Communication:   Disposition: Status is: Inpatient Remains inpatient appropriate because: Management of postop pain and making sure she tolerates diet.  Ileostomy education by wound nurse.  Possible discharge in next 1 to 2 days on oral antibiotics/Augmentin for 2 weeks per ID  Planned Discharge Destination:  Home with hospice  DVT prophylaxis-SCDs Time spent: 38 minutes  This record has been created using Systems analyst. Errors have been sought and corrected,but may not always be located. Such creation errors do not reflect on the standard of care.   Author: Lorella Nimrod, MD 01/12/2023 5:12 PM  For on call review [email or web address].

## 2023-01-12 NOTE — Progress Notes (Signed)
Date of Admission:  01/06/2023     ID: Natasha [name] is a 77 y.o. female  Active Problems:   Primary osteoarthritis of both knees   Chronic obstructive pulmonary disease (COPD) ([place])   Anemia of chronic disease   Goals of care, counseling/discussion   Nodal marginal zone B-cell lymphoma (HCC)   Chronic respiratory failure with hypoxia ([place])   Type 2 diabetes mellitus without complication, with long-term current use of insulin (HCC)   Neutropenic fever (HCC)   Pancytopenia (HCC)   Urinary retention   Effusion of left knee   Urinary tract infection   Cecal volvulus (HCC) Natasha [name] is a 77 y.o. female with a history of nodal marginal  B cell lymphoma, rt pleural effusion due to lymphoma, extensive lymphadenopathy, bone marrow involvement, hemorrhagic shock with PRBC in Jan 2024 presents with difficulty in passing urine- apparently having some trouble for the past week-but she had been to her PCP on 01/05/2023 and did not mention about that.  She had gone that to get her refills on her insulin.  But patient says  her left knee  was very painful when she woke up and was swollen, Between 12/14/2022 until 12/17/2022 for left leg cellulitis and was treated with antibiotics.   Pt had cecal volvulus over weekend and underwent surgey on 01/09/23 Subjective: Says she has some pain at the site of surgery Says the left knee pain is not an issue  Medications:   acetaminophen  1,000 mg Oral Q6H   Chlorhexidine Gluconate Cloth  6 each Topical Daily   docusate sodium  100 mg Oral BID   insulin aspart  0-15 Units Subcutaneous Q4H   levothyroxine  50 mcg Oral Q0600   loperamide HCl  1 mg Oral BID   pantoprazole  40 mg Oral Daily   Tbo-filgastrim (GRANIX) SQ  480 mcg Subcutaneous Daily   Tenofovir Alafenamide Fumarate  25 mg Oral Daily    Objective: Vital signs in last 24 hours: Patient Vitals for the past 24 hrs:  BP Temp Pulse Resp SpO2  01/12/23 0739 126/71 99.2 F (37.3 C) 90 17 98 %   01/11/23 2339 124/67 97.9 F (36.6 C) 91 20 98 %  01/11/23 1447 115/66 98.3 F (36.8 C) 90 16 98 %       PHYSICAL EXAM:  General: Alert, cooperative, no distress. Lungs: b/l air entry. Heart: Regular rate and rhythm, no murmur, rub or gallop. Port in place Abdomen: Laparotomy site covered with dressing Has a ileostomy There is a mucous fistula Extremities: Left knee knee covered with dressing. Skin: No rashes or lesions. Or bruising Lymph: Cervical, supraclavicular normal. Neurologic: Grossly non-focal  Lab Results Recent Labs    01/11/23 0449 01/12/23 0803  WBC 1.4* 1.9*  HGB 10.2* 9.8*  HCT 33.2* 32.9*  NA 138 136  K 3.7 4.0  CL 99 104  CO2 31 28  BUN 24* 20  CREATININE 0.73 0.73   Liver Panel No results for input(s): "PROT", "ALBUMIN", "AST", "ALT", "ALKPHOS", "BILITOT", "BILIDIR", "IBILI" in the last 72 hours.   Microbiology: 01/06/2023 Synovial fluid culture no growth 01/07/2023 blood culture no growth 01/07/2023 synovial fluid culture no growth Studies/Results: No results found.   Assessment/Plan: Left knee swelling acutely Concern for septic arthritis,  Synovitic fluid culture no growth  no fluid cytology sent to look for malignant cells Underwent I&D.  Surgical cultures no growth  Nodal marginal Bcell lymphoma with extensive involvement LN, bone marrow, Pleural effusion- on  rituximab, lenalidomide  Neutropenia combination of Lymphoma with marrow involovement- ?rituximab ( can cause leucopenia)  Cecal volvulus.  Status post right colectomy, ileostomy and mucous fistula creation On ceftriaxone + flagyl  Urinary retention-  foley   Chronic Edema legs ? Anemia  patient is now hospice care. So on discharge we may be able to do Amox/clav for 2 weeks Discussed the management with patient and hospitalist

## 2023-01-12 NOTE — Progress Notes (Signed)
Subjective: 3 Days Post-Op Procedure(s) (LRB): EXPLORATORY LAPAROTOMY AND RIGHT COLECTOMY (N/A) Patient reports no pain in the left knee. Patient recently developed a Volvulus, underwent surgery for this. Patient is now on hospice care. Negative for chest pain and shortness of breath Fever: Low grade temp this AM, 99.2.   Objective: Vital signs in last 24 hours: Temp:  [97.9 F (36.6 C)-99.2 F (37.3 C)] 99.2 F (37.3 C) (03/20 0739) Pulse Rate:  [90-91] 90 (03/20 0739) Resp:  [16-20] 17 (03/20 0739) BP: (115-126)/(66-71) 126/71 (03/20 0739) SpO2:  [98 %] 98 % (03/20 0739)  Intake/Output from previous day:  Intake/Output Summary (Last 24 hours) at 01/12/2023 0810 Last data filed at 01/12/2023 0645 Gross per 24 hour  Intake --  Output 1990 ml  Net -1990 ml    Intake/Output this shift: No intake/output data recorded.  Labs: Recent Labs    01/10/23 0344 01/10/23 0347 01/11/23 0449  HGB 11.2* 11.2* 10.2*   Recent Labs    01/10/23 0347 01/11/23 0449  WBC 0.5* 1.4*  RBC 4.21 3.87  HCT 35.8* 33.2*  PLT 198 190   Recent Labs    01/10/23 0347 01/11/23 0449  NA 137 138  K 4.3 3.7  CL 98 99  CO2 28 31  BUN 33* 24*  CREATININE 0.79 0.73  GLUCOSE 203* 132*  CALCIUM 7.5* 7.8*   No results for input(s): "LABPT", "INR" in the last 72 hours.    EXAM General - Patient is Alert, Appropriate, and Oriented Extremity - Neurovascular intact Sensation intact distally Dorsiflexion/Plantar flexion intact Compartment soft Dressing/Incision - ACE wrap intact to the left knee. No drainage noted laterally this morning. Skin examination demonstrates not erythema or purulent drainage. Motor Function - intact, moving foot and toes well on exam.    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   CHF (congestive heart failure) (HCC)    COPD (chronic obstructive pulmonary disease) (HCC)    Diabetes  mellitus without complication (HCC)    Hypotension    Pleural effusion    [facility] 8103ml,  2 weeks ago   Vaginal delivery    x 5    Assessment/Plan: 3 Days Post-Op Procedure(s) (LRB): EXPLORATORY LAPAROTOMY AND RIGHT COLECTOMY (N/A) Active Problems:   Primary osteoarthritis of both knees   Chronic obstructive pulmonary disease (COPD) (HCC)   Anemia of chronic disease   Goals of care, counseling/discussion   Nodal marginal zone B-cell lymphoma (HCC)   Chronic respiratory failure with hypoxia (HCC)   Type 2 diabetes mellitus without complication, with long-term current use of insulin (HCC)   Neutropenic fever (HCC)   Pancytopenia (HCC)   Urinary retention   Effusion of left knee   Urinary tract infection   Cecal volvulus (HCC)  Estimated body mass index is 24.13 kg/m as calculated from the following:   Height as of this encounter: 5\' 5"  (1.651 m).   Weight as of this encounter: 65.8 kg. Advance diet Up with therapy D/C IV fluids when tolerating po intake.  Vitals reviewed this morning. ACE wrap intact to the left knee without drainage noted. Cultures from aspiration and surgery without growth on culture at this time. Recent abdominal surgery with general surgery. Up with PT if tolerated today. Plan is for discharge with hospice. Plan for follow-up with [place] in 10-14 days for suture removal.  DVT Prophylaxis - TED hose Weight-Bearing as  tolerated to left leg  J. Cameron Proud, PA-C [facility] 01/12/2023, 8:10 AM

## 2023-01-12 NOTE — Consult Note (Addendum)
[place] Nurse Consult Note: Reason for Consult: Consult requested for bilat legs. Performed remotely after review of progress notes and photos in the EMR.  Pt has generalized edema and wears light compression.  Right leg with dry yellow scabbed skin. Left leg with same appearance and full thickness stasis ulcer to anterior calf, yellow and dry. Dressing procedure/placement/frequency: Topical treatment orders provided for bedside nurses to perform as follows to reduce edema and promote healing: Apply Xeroform gauze to bilat legs Q day, then cover with ABD pads and kerlex, then ace wrap in a spiral fashion, beginning just behind toes to below knee.  Please re-consult if further assistance is needed.  Thank-you,  Julien Girt MSN, [place], [place], [place], [place]

## 2023-01-12 NOTE — Progress Notes (Signed)
Order to discontinue JP drain.  MD Pabon notified RNs on this unit are unable to remove drain.

## 2023-01-12 NOTE — Progress Notes (Signed)
Manufacturing engineer [facility])   MSW contacted by PMT provider, Lennox Solders., NP, encouraging that [facility] confirm that patient remains interested in [facility] services @ D/C as difficult conversation held on this date. PMT concerned that patients goals do not align with hospice philosophy. MSW to speak with patient on 3.21 to allow patient the space to process goals following conversation with PMT.    Please call with any questions/concerns.    Thank you for the opportunity to participate in this patient's care.   Phillis Haggis, MSW [facility] Liaison

## 2023-01-12 NOTE — Consult Note (Signed)
[place] at [facility] [phone]) [phone] [phone]) [phone]   Name: Natasha [name] Date: 01/12/2023 MRN: [id number]  DOB: 07-11-1946  Patient Care Team: [name], NP as PCP - General (Nurse Practitioner) [name], RN (Inactive) as Case Manager [name], RN as [employer]    REASON FOR CONSULTATION: Natasha [name] is a 77 y.o. female with multiple medical problems including diabetes, COPD on home O2 at 3 L, diastolic CHF, recurrent pleural effusion, pancytopenia, and gastric lymphoma.  Patient has multiple recent hospitalizations including 12/14/2022 to 12/17/2022 with left lower extremity cellulitis, who was readmitted on 01/07/2023 with possible septic arthritis.  Her hospitalization was complicated by acute volvulus and patient was taken to the OR on 01/09/2023 for right colectomy, end ileostomy and mucous fistula.  Patient was seen in consultation by medical oncology with recommendation for hospice.  Palliative care was consulted to address goals.  SOCIAL HISTORY:     reports that she quit smoking about 5 months ago. Her smoking use included cigarettes. She started smoking about 61 years ago. She has a 110.00 pack-year smoking history. She has never used smokeless tobacco. She reports that she does not drink alcohol and does not use drugs.  Patient lives at home with her granddaughter.  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   CHF (congestive heart failure) ([place])    COPD (chronic obstructive pulmonary disease) ([place])    Diabetes mellitus without complication (HCC)    Hypotension    Pleural effusion    [facility] 840ml,  2 weeks ago   Vaginal delivery    x 5    PAST SURGICAL HISTORY:  Past Surgical History:  Procedure Laterality Date    INCISION AND DRAINAGE OF WOUND Left 01/07/2023   Procedure: ARTHROSCOPIC LEFT KNEE WASHOUT;  Surgeon: Corky Mull, MD;  Location: [facility] ORS;  Service: Orthopedics;  Laterality: Left;   IR IMAGING GUIDED PORT INSERTION  08/16/2022   LAPAROTOMY N/A 01/09/2023   Procedure: EXPLORATORY LAPAROTOMY AND RIGHT COLECTOMY;  Surgeon: Jules Husbands, MD;  Location: [facility] ORS;  Service: General;  Laterality: N/A;   TONSILLECTOMY Bilateral    as a child    HEMATOLOGY/ONCOLOGY HISTORY:  Oncology History Overview Note  # "LYMPHOMA" [[place] s/p Bx]; declines to give further information; [name]; [name]; 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  MEDICATIONS:  Current Facility-Administered Medications  Medication Dose Route Frequency Provider Last Rate Last Admin   0.9 %  sodium chloride infusion   Intravenous Continuous Pabon, Diego F, MD   Stopped at 01/10/23 1042   acetaminophen (TYLENOL) tablet 1,000 mg  1,000 mg Oral Q6H Pabon, Diego F, MD   1,000 mg  at 01/12/23 0548   albuterol (PROVENTIL) (2.5 MG/3ML) 0.083% nebulizer solution 2.5 mg  2.5 mg Nebulization Q2H PRN Pabon, Diego F, MD       alum & mag hydroxide-simeth (MAALOX/MYLANTA) 200-200-20 MG/5ML suspension 30 mL  30 mL Oral Q6H PRN Pabon, Diego F, MD   30 mL at 01/08/23 1220   bisacodyl (DULCOLAX) suppository 10 mg  10 mg Rectal Daily PRN Pabon, Diego F, MD       ceFAZolin (ANCEF) IVPB 2g/100 mL premix  2 g Intravenous 30 min Pre-Op Pabon, Diego F, MD       cefTRIAXone (ROCEPHIN) 2 g in sodium chloride 0.9 % 100 mL IVPB  2 g Intravenous Q24H Jules Husbands, MD   Stopped at 01/11/23 2221   Chlorhexidine Gluconate Cloth 2 % PADS 6 each  6 each Topical Daily Pabon, [place] F, MD   6 each at 01/12/23 0929   diphenhydrAMINE (BENADRYL) 12.5 MG/5ML elixir 12.5-25 mg  12.5-25 mg Oral Q4H PRN Pabon, Diego F, MD       docusate sodium (COLACE) capsule 100 mg  100 mg Oral BID Pabon, [place] F, MD   100 mg at 01/12/23 0859   insulin aspart (novoLOG) injection 0-15 Units  0-15 Units Subcutaneous Q4H Pabon, Bea Graff F, MD   2 Units at 01/12/23 0449   ipratropium-albuterol (DUONEB) 0.5-2.5 (3) MG/3ML nebulizer solution 3 mL  3 mL Nebulization Q4H PRN Max Sane, MD       levothyroxine (SYNTHROID) tablet 50 mcg  50 mcg Oral Q0600 Pabon, Bea Graff F, MD   50 mcg at 01/12/23 0548   loperamide HCl (IMODIUM) 2 MG/15ML solution 1 mg  1 mg Oral BID Ronny Bacon, MD   1 mg at 01/12/23 [id number]   magnesium hydroxide (MILK OF MAGNESIA) suspension 30 mL  30 mL Oral Daily PRN Pabon, Bea Graff F, MD   30 mL at 01/08/23 0636   metoCLOPramide (REGLAN) tablet 5-10 mg  5-10 mg Oral Q8H PRN Pabon, Diego F, MD       Or   metoCLOPramide (REGLAN) injection 5-10 mg  5-10 mg Intravenous Q8H PRN Pabon, Diego F, MD       metroNIDAZOLE (FLAGYL) IVPB 500 mg  500 mg Intravenous Q12H Wynelle Cleveland, RPH   Stopped at 01/12/23 [id number]   morphine (PF) 2 MG/ML injection 2 mg  2 mg Intravenous Q2H PRN Pabon, Diego F, MD   2 mg at 01/10/23 2136    ondansetron (ZOFRAN) tablet 4 mg  4 mg Oral Q6H PRN Pabon, Diego F, MD       Or   ondansetron (ZOFRAN) injection 4 mg  4 mg Intravenous Q6H PRN Pabon, Diego F, MD   4 mg at 01/10/23 0548   oxyCODONE (Oxy IR/ROXICODONE) immediate release tablet 5 mg  5 mg Oral Q4H PRN Pabon, Diego F, MD   5 mg at 01/11/23 2346   pantoprazole (PROTONIX) EC tablet 40 mg  40 mg Oral Daily Pabon, [place] F, MD   40 mg at 01/12/23 0904   sodium phosphate (FLEET) 7-19 GM/118ML enema 1 enema  1 enema Rectal Once PRN  Pabon, Diego F, MD       Tbo-Filgrastim [facility]) injection 480 mcg  480 mcg Subcutaneous Daily [place], [place] F, MD   480 mcg at 01/12/23 [id number]   Tenofovir Alafenamide Fumarate TABS 25 mg  25 mg Oral Daily Pabon, [place] F, MD   25 mg at 01/12/23 0859    VITAL SIGNS: BP 126/71 (BP Location: Left Arm)   Pulse 90   Temp 99.2 F (37.3 C)   Resp 17   Ht 5\' 5"  (1.651 m)   Wt 145 lb (65.8 kg)   SpO2 98%   BMI 24.13 kg/m  Filed Weights   01/06/23 1917  Weight: 145 lb (65.8 kg)    Estimated body mass index is 24.13 kg/m as calculated from the following:   Height as of this encounter: 5\' 5"  (1.651 m).   Weight as of this encounter: 145 lb (65.8 kg).  LABS: CBC:    Component Value Date/Time   WBC 1.9 (L) 01/12/2023 0803   HGB 9.8 (L) 01/12/2023 0803   HGB 8.0 (L) 06/22/2022 0939   HCT 32.9 (L) 01/12/2023 0803   HCT 26.6 (L) 06/22/2022 0939   PLT 195 01/12/2023 0803   PLT 77 (LL) 06/22/2022 0939   MCV 88.0 01/12/2023 0803   MCV 93 06/22/2022 0939   NEUTROABS 0.1 (LL) 01/10/2023 0344   NEUTROABS 0.4 (LL) 06/22/2022 0939   LYMPHSABS 0.2 (L) 01/10/2023 0344   LYMPHSABS 3.1 06/22/2022 0939   MONOABS 0.2 01/10/2023 0344   EOSABS 0.0 01/10/2023 0344   EOSABS 0.1 06/22/2022 0939   BASOSABS 0.0 01/10/2023 0344   BASOSABS 0.0 06/22/2022 0939   Comprehensive Metabolic Panel:    Component Value Date/Time   NA 136 01/12/2023 0803   NA 143 06/22/2022 0939   K 4.0 01/12/2023 0803   CL 104 01/12/2023  0803   CO2 28 01/12/2023 0803   BUN 20 01/12/2023 0803   BUN 21 06/22/2022 0939   CREATININE 0.73 01/12/2023 0803   CREATININE 0.92 07/07/2018 0955   GLUCOSE 83 01/12/2023 0803   CALCIUM 7.4 (L) 01/12/2023 0803   AST 13 (L) 01/09/2023 0600   ALT 8 01/09/2023 0600   ALKPHOS 62 01/09/2023 0600   BILITOT 1.2 01/09/2023 0600   BILITOT 2.0 (H) 06/22/2022 0939   PROT 4.9 (L) 01/09/2023 0600   PROT 4.9 (L) 06/22/2022 0939   ALBUMIN 2.5 (L) 01/09/2023 0600   ALBUMIN 3.4 (L) 06/22/2022 0939    RADIOGRAPHIC STUDIES: CT ABDOMEN PELVIS W CONTRAST  Addendum Date: 01/12/2023   ADDENDUM REPORT: 01/12/2023 00:33 ADDENDUM: Vascular/lymphatic: Retroperitoneal adenopathy improved since prior study. Portacaval lymph node on image 45 has a short axis diameter of 1.5 mm compared to 2.3 cm previously. Left periaortic lymph node has a short axis diameter of 1.3 cm compared to 1.7 cm previously. Right inguinal lymph node has a short axis diameter of 1.2 cm compared to 1.8 cm previously. Electronically Signed   By: Rolm Baptise M.D.   On: 01/12/2023 00:33   Result Date: 01/12/2023 CLINICAL DATA:  Abdominal pain, acute, nonlocalized. Urinary retention. EXAM: CT ABDOMEN AND PELVIS WITH CONTRAST TECHNIQUE: Multidetector CT imaging of the abdomen and pelvis was performed using the standard protocol following bolus administration of intravenous contrast. RADIATION DOSE REDUCTION: This exam was performed according to the departmental dose-optimization program which includes automated exposure control, adjustment of the mA and/or kV according to patient size and/or use of iterative reconstruction technique. CONTRAST:  121mL OMNIPAQUE IOHEXOL 300 MG/ML  SOLN COMPARISON:  11/07/2022 FINDINGS: Lower chest: Bilateral pleural effusions, right larger than left, similar to prior study. Compressive atelectasis in the lower lobes. Heart is normal size. Hepatobiliary: At least 1 gallstone seen within the gallbladder measuring 1.5 cm.  No focal hepatic abnormality or biliary ductal dilatation. Mild hepatomegaly with a craniocaudal length of 20 cm. Pancreas: No focal abnormality or ductal dilatation. Spleen: No focal abnormality. Splenomegaly with a craniocaudal length of 16 cm. Adrenals/Urinary Tract: Foley catheter in the bladder which is decompressed. No renal or adrenal mass. No hydronephrosis. Stomach/Bowel: Dilated segment of large bowel in the right abdomen with air-fluid level and layering stool. On coronal imaging, there is a swirled appearance leading to this segment of large bowel. 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 Small bowel decompressed as is the stomach. Vascular/Lymphatic: No evidence of aneurysm or adenopathy. Aortic atherosclerosis. Reproductive: Uterus and adnexa unremarkable.  No mass. Other: Diffuse edema throughout the subcutaneous soft tissues compatible with anasarca. Small amount of free fluid in the abdomen and pelvis. Musculoskeletal: No acute bony abnormality. IMPRESSION: Dilated right colon with air-fluid level in the right upper abdomen. Swirled appearance noted on coronal imaging leading to this loop of bowel. 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 Hepatosplenomegaly. Small amount of free fluid in the abdomen and pelvis. Moderate to large bilateral effusions, right greater than left. These results will be called to the ordering clinician or representative by the Radiologist Assistant, and communication documented in the PACS or [employer]. Electronically Signed: By: Rolm Baptise M.D. On: 01/08/2023 21:20   DG ABD ACUTE 2+V W 1V CHEST  Result Date: 01/09/2023 CLINICAL DATA:  Cecal volvulus EXAM: DG ABDOMEN ACUTE WITH 1 VIEW CHEST COMPARISON:  CT from the previous day FINDINGS: Cardiac shadow is stable. Bilateral pleural effusions are again seen. Right chest wall port is again noted. Central consolidation/scarring in right lung is again seen. No pneumothorax is noted. No free air is seen.  Persistent dilatation of the cecum and ascending colon is noted against highly suspicious for cecal volvulus. The overall appearance is similar to that seen on the previous day. Contrast is noted in the more proximal small bowel. No definitive distal colonic contrast is seen. IMPRESSION: Stable appearance of cecal volvulus. Bilateral pleural effusions and persistent changes in the right lung consistent of consolidation and scarring. Electronically Signed   By: Inez Catalina M.D.   On: 01/09/2023 11:24   US RENAL  Result Date: 01/08/2023 CLINICAL DATA:  [id number] Urine retention [id number] EXAM: RENAL / URINARY TRACT ULTRASOUND COMPLETE COMPARISON:  CT 11/07/2022 FINDINGS: Right Kidney: Renal measurements: 8.2 x 3.7 x 3.6 cm = volume: 57.2 mL. No hydronephrosis. Mildly increased renal cortical echogenicity. Left Kidney: Renal measurements: 9.2 x 3.6 x 4.7 cm = volume: 81.3 mL. No hydronephrosis. Mildly increased renal cortical echogenicity. Bladder: Decompressed with Foley catheter in place. Other: Bilateral pleural effusions. IMPRESSION: No hydronephrosis. Mildly increased renal cortical echogenicity bilaterally, as can be seen in medical renal disease. Bladder is decompressed with Foley catheter in place. Bilateral pleural effusions noted. Electronically Signed   By: Maurine Simmering M.D.   On: 01/08/2023 18:13   DG Knee Complete 4 Views Left  Result Date: 01/06/2023 CLINICAL DATA:  Pain and swelling of left knee EXAM: LEFT KNEE - COMPLETE 4+ VIEW COMPARISON:  12/14/2022 FINDINGS: Frontal, bilateral oblique, lateral views of the left knee are obtained. No acute fracture, subluxation, or dislocation. Interval development of large left knee effusion. Stable 3 compartmental osteoarthritis. There is progressive diffuse subcutaneous edema surrounding the  left knee and throughout the visualized left lower extremity. IMPRESSION: 1. Interval development of large left knee effusion. 2. Diffuse subcutaneous edema throughout the  visualized left leg, which has progressed since prior study. 3. No acute bony abnormality. 4. Stable 3 compartmental osteoarthritis. Electronically Signed   By: Randa Ngo M.D.   On: 01/06/2023 22:32   DG Chest 2 View  Result Date: 12/15/2022 CLINICAL DATA:  Shortness of breath, increased EXAM: CHEST - 2 VIEW COMPARISON:  12/07/2022 FINDINGS: RIGHT jugular Port-A-Cath with tip projecting over cavoatrial junction. Enlargement of cardiac silhouette with pulmonary vascular congestion. Diffuse infiltrates consistent with pulmonary edema. Interval increase in BILATERAL pleural effusions and bibasilar atelectasis. No pneumothorax or acute osseous findings. IMPRESSION: Increased pulmonary edema and bibasilar pleural effusions/atelectasis. Electronically Signed   By: Lavonia Dana M.D.   On: 12/15/2022 16:27   DG Tibia/Fibula Left  Result Date: 12/14/2022 CLINICAL DATA:  Leg pain swelling EXAM: LEFT TIBIA AND FIBULA - 2 VIEW COMPARISON:  None Available. FINDINGS: No fracture or malalignment. No soft tissue emphysema. No periostitis or osseous destructive change. 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 Diffuse soft tissue swelling IMPRESSION: No acute osseous abnormality. 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 Electronically Signed   By: Donavan Foil M.D.   On: 12/14/2022 18:34   US Venous Img Lower Unilateral Left  Result Date: 12/14/2022 CLINICAL DATA:  left leg pain EXAM: LEFT LOWER EXTREMITY VENOUS DOPPLER ULTRASOUND TECHNIQUE: Gray-scale sonography with compression, as well as color and duplex ultrasound, were performed to evaluate the deep venous system(s) from the level of the common femoral vein through the popliteal and proximal calf veins. COMPARISON:  None Available. FINDINGS: VENOUS Normal compressibility of the common femoral, superficial femoral, and popliteal veins, as well as the visualized calf veins. Visualized portions of profunda femoral vein and great saphenous vein unremarkable. No filling defects to  suggest DVT on grayscale or color Doppler imaging. Doppler waveforms show normal direction of venous flow, normal respiratory plasticity and response to augmentation. Limited views of the contralateral common femoral vein are unremarkable. OTHER Multiple lymph nodes in the groins noted. The largest measures approximately 4.2 x 1.3 cm in the left groin. IMPRESSION: No evidence of DVT in the left lower extremity. Electronically Signed   By: Margaretha Sheffield M.D.   On: 12/14/2022 18:22    PERFORMANCE STATUS (ECOG) : 3 - Symptomatic, >50% confined to bed  Review of Systems Unless otherwise noted, a complete review of systems is negative.  Physical Exam General: NAD Pulmonary: Unlabored, on O2 Extremities: no edema, no joint deformities Skin: no rashes Neurological: Weakness but otherwise nonfocal  IMPRESSION: Patient seemed angry when I introduced myself as the oncology palliative care nurse practitioner.  She states that she has been told that she would go home with hospice care and that means that she "has 6 months to live."  She asked me if I agreed with this prognosis.  We then explained what hospice would entail and that they would be focused on keeping her home and comfortable until end-of-life.  We discussed her multiple comorbidities that could prove life limiting.  I explained that hospice criteria means that a patient's end-of-life could occur within 6 months but that I was not ascribing any certain timeframe to her life expectancy.  Patient then accused me of wishing her dead.  I explained that I did not in any way say or feel that.  Patient told me that she does not wish to die.  She voiced frustration that she proceeded  with her abdominal surgery given what she is now told to be a limited prognosis.  I attempted to further explore her feelings and goals but she asked me to leave.  I offered to call her family but she stated she would prefer me not to do so.  I called and spoke with hospice  liaison, Shania Junious.  I would encourage further conversations prior to discharge as it does does not currently sound like her goals are aligned with a hospice philosophy of care.  PLAN: -Continue current scope of treatment -Recommend hospice liaison follow up with patient tomorrow  Case and plan discussed with Dr. Janese Banks  Time Total: 60 minutes  Visit consisted of counseling and education dealing with the complex and emotionally intense issues of symptom management and palliative care in the setting of serious and potentially life-threatening illness.Greater than 50%  of this time was spent counseling and coordinating care related to the above assessment and plan.  Signed by: Altha Harm, PhD, NP-C

## 2023-01-13 DIAGNOSIS — R339 Retention of urine, unspecified: Secondary | ICD-10-CM | POA: Diagnosis not present

## 2023-01-13 DIAGNOSIS — N390 Urinary tract infection, site not specified: Secondary | ICD-10-CM | POA: Diagnosis not present

## 2023-01-13 DIAGNOSIS — K562 Volvulus: Secondary | ICD-10-CM | POA: Diagnosis not present

## 2023-01-13 DIAGNOSIS — D709 Neutropenia, unspecified: Secondary | ICD-10-CM | POA: Diagnosis not present

## 2023-01-13 DIAGNOSIS — M25462 Effusion, left knee: Secondary | ICD-10-CM | POA: Diagnosis not present

## 2023-01-13 DIAGNOSIS — C8300 Small cell B-cell lymphoma, unspecified site: Secondary | ICD-10-CM | POA: Diagnosis not present

## 2023-01-13 LAB — AEROBIC/ANAEROBIC CULTURE W GRAM STAIN (SURGICAL/DEEP WOUND)
Culture: NO GROWTH
Culture: NO GROWTH

## 2023-01-13 LAB — GLUCOSE, CAPILLARY
Glucose-Capillary: 122 mg/dL — ABNORMAL HIGH (ref 70–99)
Glucose-Capillary: 135 mg/dL — ABNORMAL HIGH (ref 70–99)
Glucose-Capillary: 153 mg/dL — ABNORMAL HIGH (ref 70–99)
Glucose-Capillary: 87 mg/dL (ref 70–99)
Glucose-Capillary: 96 mg/dL (ref 70–99)
Glucose-Capillary: 98 mg/dL (ref 70–99)

## 2023-01-13 MED ORDER — APIXABAN 2.5 MG PO TABS
2.5000 mg | ORAL_TABLET | Freq: Two times a day (BID) | ORAL | Status: DC
  Administered 2023-01-13 – 2023-01-14 (×2): 2.5 mg via ORAL
  Filled 2023-01-13 (×2): qty 1

## 2023-01-13 MED ORDER — CALAMINE EX LOTN
1.0000 | TOPICAL_LOTION | Freq: Two times a day (BID) | CUTANEOUS | Status: DC
  Administered 2023-01-13 – 2023-01-14 (×4): 1 via TOPICAL
  Filled 2023-01-13: qty 177

## 2023-01-13 MED ORDER — DOXYCYCLINE HYCLATE 100 MG PO TABS
100.0000 mg | ORAL_TABLET | Freq: Two times a day (BID) | ORAL | Status: DC
  Administered 2023-01-13 – 2023-01-14 (×2): 100 mg via ORAL
  Filled 2023-01-13 (×2): qty 1

## 2023-01-13 NOTE — Assessment & Plan Note (Signed)
Fever curve improving.  Antibiotics switched with doxycycline for 2 weeks as recommended by ID

## 2023-01-13 NOTE — Assessment & Plan Note (Signed)
Acute urinary retention Foley catheter was placed in the ED.  removed on 3/18 Renal ultrasound did not show any hydronephrosis or pathology Completed a course of Rocephin

## 2023-01-13 NOTE — Progress Notes (Signed)
[facility])  Initially, plan was for patient to be admitted at home with [facility] hospice services. However, after difficult conversation with PMT on 3.20, MSW met with patient at bedside to confirm hospice interest.    MSW made introductions and patient not receptive to MSW visit. Patient reports, 'how do you live yourself? All you do is kill people off.' MSW provided education that this is not hospice philosophy but empowered her to make whatever she feels comfortable with. Patient has chosen not to proceed as she wants to pursue other options.  Patient is not an active patient with [facility] services as has declined any further conversations w/ [facility] for palliative or hospice services.  [facility] to sign off at this time.     Please call with any questions/concerns.    Thank you for the opportunity to participate in this patient's care.    Phillis Haggis, MSW [facility] Liaison  [phone]

## 2023-01-13 NOTE — Progress Notes (Addendum)
Seen and examined. Drain removed. Abd soft, ileostomy working , No peritonitis and no infection. JP ( serous) removed. Path no evidence of cancer F/U as outpt

## 2023-01-13 NOTE — Progress Notes (Signed)
PT Cancellation Note  Patient Details Name: Natasha [name] MRN: [id number] DOB: Aug 12, 1946   Cancelled Treatment:    Reason Eval/Treat Not Completed: Other (comment) (Pt actively preparring her oatmeal and coffee on arrival. She is assisted per requrest with reposition in bed to accomodate an eating-friendly position. Splenda and 1/2&1/2 given as requested. Pt denies desire to mobilize now or later, anxious to start the DC process ASAP and go home.)   12:35 PM, 01/13/23 [name], PT, DPT Physical Therapist - [facility]  [phone] ([place])    [name] 01/13/2023, 12:35 PM

## 2023-01-13 NOTE — Progress Notes (Signed)
Date of Admission:  01/06/2023     ID: Natasha [name] is a 77 y.o. female  Active Problems:   Primary osteoarthritis of both knees   Chronic obstructive pulmonary disease (COPD) ([place])   Anemia of chronic disease   Goals of care, counseling/discussion   Nodal marginal zone B-cell lymphoma (HCC)   Chronic respiratory failure with hypoxia ([place])   Type 2 diabetes mellitus without complication, with long-term current use of insulin (HCC)   Neutropenic fever (HCC)   Pancytopenia (HCC)   Urinary retention   Effusion of left knee   Urinary tract infection   Cecal volvulus (HCC)   Palliative care encounter Natasha [name] is a 77 y.o. female with a history of nodal marginal  B cell lymphoma, rt pleural effusion due to lymphoma, extensive lymphadenopathy, bone marrow involvement, hemorrhagic shock with PRBC in Jan 2024 presents with difficulty in passing urine- apparently having some trouble for the past week-but she had been to her PCP on 01/05/2023 and did not mention about that.  She had gone that to get her refills on her insulin.  But patient says  her left knee  was very painful when she woke up and was swollen, Between 12/14/2022 until 12/17/2022 for left leg cellulitis and was treated with antibiotics.   Pt had cecal volvulus  and underwent surgey on 01/09/23 Subjective: Pt is doing okay Says pain better  Medications:   acetaminophen  1,000 mg Oral Q6H   calamine  1 Application Topical BID   Chlorhexidine Gluconate Cloth  6 each Topical Daily   docusate sodium  100 mg Oral BID   insulin aspart  0-15 Units Subcutaneous Q4H   levothyroxine  50 mcg Oral Q0600   loperamide HCl  1 mg Oral BID   pantoprazole  40 mg Oral Daily   Tbo-filgastrim (GRANIX) SQ  480 mcg Subcutaneous Daily   Tenofovir Alafenamide Fumarate  25 mg Oral Daily    Objective: Vital signs in last 24 hours: Patient Vitals for the past 24 hrs:  BP Temp Temp src Pulse Resp SpO2  01/13/23 0736 (!) 117/57 99.8 F (37.7  C) Oral 95 17 97 %  01/12/23 2347 134/68 98.7 F (37.1 C) -- 97 20 97 %  01/12/23 1639 117/65 98.8 F (37.1 C) -- 87 17 97 %       PHYSICAL EXAM:  General: Alert, cooperative, no distress. Lungs: b/l air entry. Heart: Regular rate and rhythm, no murmur, rub or gallop. Port in place Abdomen: Laparotomy site looks fine Has a ileostomy There is a mucous fistula JP drain on the rt side Extremities: Left knee knee covered with dressing. Swelling down Skin: maculipapular rash noted      More a the site of sweating Lymph: Cervical, supraclavicular normal. Neurologic: Grossly non-focal  Lab Results Recent Labs    01/11/23 0449 01/12/23 0803  WBC 1.4* 1.9*  HGB 10.2* 9.8*  HCT 33.2* 32.9*  NA 138 136  K 3.7 4.0  CL 99 104  CO2 31 28  BUN 24* 20  CREATININE 0.73 0.73   Liver Panel No results for input(s): "PROT", "ALBUMIN", "AST", "ALT", "ALKPHOS", "BILITOT", "BILIDIR", "IBILI" in the last 72 hours.   Microbiology: 01/06/2023 Synovial fluid culture no growth 01/07/2023 blood culture no growth 01/07/2023 synovial fluid culture no growth Studies/Results: No results found.   Assessment/Plan: Left knee swelling Concern for septic arthritis,  Synovitic fluid culture no growth no fluid cytology sent to look for malignant cells Underwent I&D.  Surgical cultures no growth Was on ceftriaoxne - DC today because of rash Will do PO DOXY for 2- 3weeks   Nodal marginal Bcell lymphoma with extensive involvement LN, bone marrow, Pleural effusion- on rituximab, lenalidomide  Neutropenia combination of Lymphoma with marrow involovement- ?rituximab ( can cause leucopenia)  Cecal volvulus.  Status post right colectomy, ileostomy and mucous fistula creation On ceftriaxone + flagyl- DC flagyl  Urinary retention-  foley   Chronic Edema legs ? Anemia  New rash- maculopapular on the skin that is prone for sweating . She is on may meds including ceftriaxone, filgrastim,  morphine which can all cause rash Observe closely for worsening Calamine topically and benadryl   patient is now hospice care.  Discussed the management with patient and care team

## 2023-01-13 NOTE — Progress Notes (Signed)
Subjective: 4 Days Post-Op Procedure(s) (LRB): EXPLORATORY LAPAROTOMY AND RIGHT COLECTOMY (N/A) Patient reports no pain in the left knee. Patient recently developed a Volvulus, underwent surgery for this. Patient is now on hospice care. Negative for chest pain and shortness of breath Fever: Low grade temp this AM, 99.2.   Objective: Vital signs in last 24 hours: Temp:  [98.7 F (37.1 C)-99.8 F (37.7 C)] 99.8 F (37.7 C) (03/21 0736) Pulse Rate:  [87-97] 95 (03/21 0736) Resp:  [17-20] 17 (03/21 0736) BP: (117-134)/(57-68) 117/57 (03/21 0736) SpO2:  [97 %] 97 % (03/21 0736)  Intake/Output from previous day:  Intake/Output Summary (Last 24 hours) at 01/13/2023 0743 Last data filed at 01/13/2023 0500 Gross per 24 hour  Intake 353.73 ml  Output 1080 ml  Net -726.27 ml    Intake/Output this shift: No intake/output data recorded.  Labs: Recent Labs    01/11/23 0449 01/12/23 0803  HGB 10.2* 9.8*   Recent Labs    01/11/23 0449 01/12/23 0803  WBC 1.4* 1.9*  RBC 3.87 3.74*  HCT 33.2* 32.9*  PLT 190 195   Recent Labs    01/11/23 0449 01/12/23 0803  NA 138 136  K 3.7 4.0  CL 99 104  CO2 31 28  BUN 24* 20  CREATININE 0.73 0.73  GLUCOSE 132* 83  CALCIUM 7.8* 7.4*   No results for input(s): "LABPT", "INR" in the last 72 hours.    EXAM General - Patient is Alert, Appropriate, and Oriented Extremity - Neurovascular intact Sensation intact distally Dorsiflexion/Plantar flexion intact Compartment soft Dressing/Incision - ACE wrap intact to the left knee. No drainage noted laterally this morning. Skin examination demonstrates not erythema or purulent drainage. Motor Function - intact, moving foot and toes well on exam.    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   CHF (congestive heart failure) (HCC)    COPD (chronic obstructive pulmonary disease) (HCC)    Diabetes mellitus without  complication (HCC)    Hypotension    Pleural effusion    ARMc 894ml,  2 weeks ago   Vaginal delivery    x 5    Assessment/Plan: 4 Days Post-Op Procedure(s) (LRB): EXPLORATORY LAPAROTOMY AND RIGHT COLECTOMY (N/A) Active Problems:   Primary osteoarthritis of both knees   Chronic obstructive pulmonary disease (COPD) (HCC)   Anemia of chronic disease   Goals of care, counseling/discussion   Nodal marginal zone B-cell lymphoma (HCC)   Chronic respiratory failure with hypoxia (HCC)   Type 2 diabetes mellitus without complication, with long-term current use of insulin (HCC)   Neutropenic fever (HCC)   Pancytopenia (HCC)   Urinary retention   Effusion of left knee   Urinary tract infection   Cecal volvulus (HCC)   Palliative care encounter  Estimated body mass index is 24.13 kg/m as calculated from the following:   Height as of this encounter: 5\' 5"  (1.651 m).   Weight as of this encounter: 65.8 kg. Advance diet Up with therapy D/C IV fluids when tolerating po intake.  Vitals reviewed this morning. ACE wrap intact to the left knee without drainage noted. Cultures from aspiration and surgery without growth on culture at this time. Recent abdominal surgery with general surgery. Up with PT if tolerated today. Plan is for discharge with hospice. Plan for follow-up with [place] in 10-14 days for suture removal.  DVT Prophylaxis - TED  hose Weight-Bearing as tolerated to left leg  J. Cameron Proud, PA-C [facility] Orthopaedic Surgery 01/13/2023, 7:43 AM

## 2023-01-13 NOTE — Progress Notes (Signed)
Progress Note   Patient: Natasha [name] [id number] DOB: 1946-06-24 DOA: 01/06/2023     6 DOS: the patient was seen and examined on 01/13/2023   Brief hospital course: 77 y.o. female with medical history significant for type 2 diabetes mellitus, COPD on home O2 at 3 L, postherpetic neuralgia, tobacco use, diastolic CHF with recurrent pleural effusion, gastric lymphoma and well as anemia and thrombocytopenia, hospitalized from 2/20 to 12/17/2022 with left leg cellulitis who presents to the emergency room with complaint of urinary frequency and dysuria and a sensation of incomplete bladder emptying.  At the same time she complains of left knee pain and swelling   3/15: Left knee arthroscopic washout by Ortho, ID consult for suspected septic knee, Foley placed in the ED for acute urinary retention 3/16: US renal pending, plan for hospice at discharge at home, consult oncology for neutropenia 3/17: Had severe abdominal pain later in yesterday, required CT which showed volvulus.  Status post right colectomy, end ileostomy and mucous fistula  3/18: Added Flagyl and diet 3/19: Still having difficulty controlling her pain, WOC nurse planning on training for ileostomy education, not eating much. 3/20: Continue to have abdominal pain.  Ileostomy is are working.  Palliative care was consulted by oncology as they were now recommending hospice care.  3/21: Vitals stable.  Awaiting final decision from hospice, Patient will follow-up with orthopedic surgery in 2 weeks for suture removal. Drains were removed by general surgery today.  Patient refused to accept hospice and would like to continue with treatment although not a current candidate for continuation of cancer management.  Not sure whether she really understand her illness and its trajectory. Antibiotics switched with doxycycline for 2 weeks as recommended by ID  Assessment and Plan: Effusion of left knee Possible septic joint History of osteoarthritis  both knees Continue ceftriaxone and vancomycin, Flagyl added Status post left knee arthroscopy washout by Ortho on 3/15.  Joint fluid sent for diagnostics including cytology Pain control Follow synovial fluid Gram stain and culture.  So far negative  Urinary tract infection Acute urinary retention Foley catheter was placed in the ED.  removed on 3/18 Renal ultrasound did not show any hydronephrosis or pathology Completed a course of Rocephin  Urinary retention Foley placed in ED, renal ultrasound does not show any hydronephrosis.  Removed Foley on 3/18  Type 2 diabetes mellitus without complication, with long-term current use of insulin (HCC) Continue sliding scale insulin coverage.  Anemia of chronic disease H&H is stable   Neutropenic fever (HCC) Fever curve improving.  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 Janese Banks at the cancer center.  Overall poor prognosis daughter in agreement with keeping her DNR and have hospice follow at discharge.  Chronic obstructive pulmonary disease (COPD) (HCC) Chronic respiratory failure with hypoxia Not acutely exacerbated Continue home inhalers with DuoNebs as needed On room air.  Cecal volvulus ([place]) Confirmed on a CT scan 3/16.  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  Surgery Dr. Dahlia Byes took her to the OR on 3/17 and has undergone colectomy, end ileostomy and mucous fistula.  Started soft diet  On Flagyl, vancomycin and Rocephin for now.  Pancytopenia ([place]) Due to lymphoma.  Oncology seen, status post Granix.  Overall poor prognosis.  Goals of care, counseling/discussion Plan for hospice at home upon discharge.  Patient has 9  readmissions in the last 6 months and 2 ED visits per [facility] record.  Overall poor prognosis.        Subjective: Patient continued to have some  abdominal pain and feeling weak.  She was very upset when hospice suggestions were brought up.  She wants to continue with her treatment, also saying at times that she has only 6 months to live??  Physical Exam: Vitals:   01/12/23 1639 01/12/23 2347 01/13/23 0736 01/13/23 1509  BP: 117/65 134/68 (!) 117/57 114/60  Pulse: 87 97 95 89  Resp: 17 20 17 17   Temp: 98.8 F (37.1 C) 98.7 F (37.1 C) 99.8 F (37.7 C) 99.5 F (37.5 C)  TempSrc:   Oral Oral  SpO2: 97% 97% 97% 99%  Weight:      Height:      General.  Chronically ill-appearing elderly lady, in no acute distress. Pulmonary.  Lungs clear bilaterally, normal respiratory effort. CV.  Regular rate and rhythm, no JVD, rub or murmur. Abdomen.  Soft, 2  bags in place, BS positive. CNS.  Alert and oriented .  No focal neurologic deficit. Extremities.  No edema, no cyanosis, pulses intact and symmetrical. Psychiatry.  Appears to have some cognitive impairment  Data Reviewed: Prior data reviewed  Family Communication: Discussed with daughter on phone.  Disposition: Status is: Inpatient Remains inpatient appropriate because: Management of postop pain and making sure she tolerates diet.  Ileostomy education by wound nurse.  Possible discharge in next 1 to 2 days on oral antibiotics/Augmentin for 2 weeks per ID  Planned Discharge Destination:  Home with hospice  DVT prophylaxis-SCDs Time spent: 39 minutes  This record has been created using Systems analyst. Errors have been sought and corrected,but may not always be located. Such creation errors do not reflect on the standard of care.   Author: Lorella Nimrod, MD 01/13/2023 4:17 PM  For on call review [email or web address].

## 2023-01-13 NOTE — Consult Note (Signed)
[facility] Nurse ostomy follow up Stoma type/location: RMQ ileostomy LMQ mucus, leaking continuous clear yellow liquid Stomal assessment/size: Fistula 1 3/4" edematous, budded Ileostomy, 1 1/2" round pink and moist   Peristomal assessment:  skin to abdomen is raised and red.  Not itching.  She says she is unclear of the cause and "no one seems to know, they are watching it"  She does have cream to this area.  Treatment options for stomal/peristomal skin: stoma powder and skin prep  Barrier ring to ileostomy Output liquid yellow effluent to LMQ fistula RMQ ileostomy with liquid brown stool Ostomy pouching: 2pc. 2 1/4" pouch to ileostomy with barrier ring Switched to urostomy pouch for fistula due to continuous liquid effluent.  When lying in bed, the liquid rolls back over barrier and can create leakage.  A pouch designed for liquid as well as frequent emptying will solve this.   I leave an extra pouch set at the bedside.  Education provided: We discuss calling for help when pouch is 1/3 full for emptying.  Twice weekly pouch changes.  Patient Is largely uninvolved this morning but does not wish for family to come assist today.  Enrolled patient in [place] Start Discharge program: Yes  but will need to update to urostomy pouch  Will follow.  Estrellita Ludwig MSN, RN, FNP-BC CWON Wound, Ostomy, Continence Nurse [facility] [phone] Pager [phone]

## 2023-01-13 NOTE — Plan of Care (Signed)
  Problem: Clinical Measurements: Goal: Ability to avoid or minimize complications of infection will improve Outcome: Progressing   Problem: Clinical Measurements: Goal: Ability to maintain clinical measurements within normal limits will improve Outcome: Progressing Goal: Will remain free from infection Outcome: Progressing

## 2023-01-13 NOTE — Progress Notes (Addendum)
Subjective: 4 Days Post-Op Procedure(s) (LRB): EXPLORATORY LAPAROTOMY AND RIGHT COLECTOMY (N/A) Patient reports no pain in the left knee. Patient recently developed a Volvulus, underwent surgery for this. Patient is now on hospice care. Negative for chest pain and shortness of breath Fever: Low grade temp this AM, 99.8   Objective: Vital signs in last 24 hours: Temp:  [98.7 F (37.1 C)-99.8 F (37.7 C)] 99.8 F (37.7 C) (03/21 0736) Pulse Rate:  [87-97] 95 (03/21 0736) Resp:  [17-20] 17 (03/21 0736) BP: (117-134)/(57-68) 117/57 (03/21 0736) SpO2:  [97 %] 97 % (03/21 0736)  Intake/Output from previous day:  Intake/Output Summary (Last 24 hours) at 01/13/2023 1310 Last data filed at 01/13/2023 1020 Gross per 24 hour  Intake 333.51 ml  Output 1060 ml  Net -726.49 ml    Intake/Output this shift: Total I/O In: -  Out: 50 [Drains:50]  Labs: Recent Labs    01/11/23 0449 01/12/23 0803  HGB 10.2* 9.8*   Recent Labs    01/11/23 0449 01/12/23 0803  WBC 1.4* 1.9*  RBC 3.87 3.74*  HCT 33.2* 32.9*  PLT 190 195   Recent Labs    01/11/23 0449 01/12/23 0803  NA 138 136  K 3.7 4.0  CL 99 104  CO2 31 28  BUN 24* 20  CREATININE 0.73 0.73  GLUCOSE 132* 83  CALCIUM 7.8* 7.4*   No results for input(s): "LABPT", "INR" in the last 72 hours.    EXAM General - Patient is Alert, Appropriate, and Oriented Extremity - Neurovascular intact Sensation intact distally Dorsiflexion/Plantar flexion intact Compartment soft Dressing/Incision - ACE wrap intact to the left knee. No drainage noted laterally this morning. Skin examination demonstrates not erythema or purulent drainage. Motor Function - intact, moving foot and toes well on exam.    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   CHF (congestive heart failure) (HCC)    COPD (chronic obstructive pulmonary disease) (HCC)    Diabetes mellitus  without complication (HCC)    Hypotension    Pleural effusion    ARMc 861ml,  2 weeks ago   Vaginal delivery    x 5    Assessment/Plan: 4 Days Post-Op Procedure(s) (LRB): EXPLORATORY LAPAROTOMY AND RIGHT COLECTOMY (N/A) Active Problems:   Primary osteoarthritis of both knees   Chronic obstructive pulmonary disease (COPD) (HCC)   Anemia of chronic disease   Goals of care, counseling/discussion   Nodal marginal zone B-cell lymphoma (HCC)   Chronic respiratory failure with hypoxia (HCC)   Type 2 diabetes mellitus without complication, with long-term current use of insulin (HCC)   Neutropenic fever (HCC)   Pancytopenia (HCC)   Urinary retention   Effusion of left knee   Urinary tract infection   Cecal volvulus (HCC)   Palliative care encounter  Estimated body mass index is 24.13 kg/m as calculated from the following:   Height as of this encounter: 5\' 5"  (1.651 m).   Weight as of this encounter: 65.8 kg. Advance diet Up with therapy D/C IV fluids when tolerating po intake.  Vitals reviewed this morning. ACE wrap intact to the left knee without drainage noted. Cultures from aspiration and surgery without growth on culture Recent abdominal surgery with general surgery. Up with PT if tolerated today. Plan is for discharge with hospice. Plan for follow-up with [place] in 10-14 days for suture removal.  DVT Prophylaxis -  TED hose Weight-Bearing as tolerated to left leg  J. Cameron Proud, PA-C [facility] Orthopaedic Surgery 01/13/2023, 1:10 PM    Addendum: The patient's left knee cultures show no growth.  Most likely, the left knee effusion and pain with which she presented on admission was due to underlying degenerative joint disease with possible gout, although no crystals were identified on the initial aspirate.    We will sign off on this patient at this time, given that she is stable from an orthopedic standpoint.  If you have further need of orthopedic  input during this hospitalization, please reconsult [place].   Pascal Lux, MD [facility] Orthopaedic Surgery 01/13/2023, 4:55 PM

## 2023-01-13 NOTE — Assessment & Plan Note (Signed)
Possible septic joint History of osteoarthritis both knees Continue ceftriaxone and vancomycin, Flagyl added Status post left knee arthroscopy washout by Ortho on 3/15.  Joint fluid sent for diagnostics including cytology Pain control Follow synovial fluid Gram stain and culture.  So far negative

## 2023-01-14 ENCOUNTER — Encounter: Payer: Self-pay | Admitting: Oncology

## 2023-01-14 ENCOUNTER — Encounter: Payer: Self-pay | Admitting: Internal Medicine

## 2023-01-14 DIAGNOSIS — R339 Retention of urine, unspecified: Secondary | ICD-10-CM | POA: Diagnosis not present

## 2023-01-14 DIAGNOSIS — D709 Neutropenia, unspecified: Secondary | ICD-10-CM | POA: Diagnosis not present

## 2023-01-14 DIAGNOSIS — M25462 Effusion, left knee: Secondary | ICD-10-CM | POA: Diagnosis not present

## 2023-01-14 DIAGNOSIS — N390 Urinary tract infection, site not specified: Secondary | ICD-10-CM | POA: Diagnosis not present

## 2023-01-14 DIAGNOSIS — M170 Bilateral primary osteoarthritis of knee: Secondary | ICD-10-CM

## 2023-01-14 LAB — GLUCOSE, CAPILLARY
Glucose-Capillary: 107 mg/dL — ABNORMAL HIGH (ref 70–99)
Glucose-Capillary: 102 mg/dL — ABNORMAL HIGH (ref 70–99)
Glucose-Capillary: 203 mg/dL — ABNORMAL HIGH (ref 70–99)

## 2023-01-14 MED ORDER — PANTOPRAZOLE SODIUM 40 MG PO TBEC: 40.0000 mg | DELAYED_RELEASE_TABLET | Freq: Every day | ORAL | 0 refills | Status: DC

## 2023-01-14 MED ORDER — OXYCODONE HCL 5 MG PO TABS: 5.0000 mg | ORAL_TABLET | ORAL | 0 refills | Status: DC | PRN

## 2023-01-14 MED ORDER — DOXYCYCLINE HYCLATE 100 MG PO TABS: 100.0000 mg | ORAL_TABLET | Freq: Two times a day (BID) | ORAL | 0 refills | Status: AC

## 2023-01-14 MED ORDER — FUROSEMIDE 20 MG PO TABS: 20.0000 mg | ORAL_TABLET | Freq: Every day | ORAL | 3 refills | Status: DC | PRN

## 2023-01-14 NOTE — TOC Transition Note (Signed)
Transition of Care [facility]) - CM/SW Discharge Note   Patient Details  Name: Simrun Laroque Garfinkel MRN: [id number] Date of Birth: [birth year]  Transition of Care [facility]) CM/SW Contact:  Candie Chroman, LCSW Phone Number: 01/14/2023, 11:27 AM   Clinical Narrative:  Patient has orders to discharge home today. Patient was set up with [facility] last admission but they were never able to reach her to start services. They are willing to try again. No further concerns. CSW signing off.   Final next level of care: [place] Barriers to Discharge: Barriers Resolved   Patient Goals and CMS Choice      Discharge Placement                      Patient and family notified of of transfer: 01/14/23  Discharge Plan and Services Additional resources added to the After Visit Summary for                            [facility] Arranged: RN, PT, Social Work [employer] Agency: [place] Date [name] Agency Contacted: 01/14/23   Representative spoke with at [place]: [place] Determinants of Health ([place]) Interventions [place]: No Food Insecurity (01/07/2023)  Housing: Low Risk  (01/07/2023)  Transportation Needs: Unmet Transportation Needs (01/07/2023)  Utilities: Not At Risk (01/07/2023)  Alcohol Screen: Low Risk  (08/24/2022)  Depression (PHQ2-9): Low Risk  (08/24/2022)  Financial Resource Strain: Medium Risk (08/24/2022)  Physical Activity: Inactive (08/24/2022)  Social Connections: Socially Isolated (08/24/2022)  Stress: Stress Concern Present (08/24/2022)  Tobacco Use: Medium Risk (01/10/2023)     Readmission Risk Interventions    11/08/2022   10:53 AM 10/21/2022    1:13 PM 08/17/2022   11:05 AM  Readmission Risk Prevention Plan  Transportation Screening Complete Complete Complete  PCP or Specialist Appt within 3-5 Days   Complete  HRI or [place]   Complete  Social Work Consult for [place] Planning/Counseling    Complete  Palliative Care Screening   Not Applicable  Medication Review Press photographer) Complete Complete Complete  PCP or Specialist appointment within 3-5 days of discharge Complete Complete   HRI or [place] Complete Complete   SW Recovery Care/Counseling Consult Complete Complete   Palliative Care Screening Complete Not [place] Not Applicable Not Applicable

## 2023-01-14 NOTE — Discharge Summary (Signed)
Physician Discharge Summary   Patient: Natasha [name] Spiering MRN: [id number] DOB: 1946/03/27  Admit date:     01/06/2023  Discharge date: 01/14/23  Discharge Physician: Lorella Nimrod   PCP: [name], NP   Recommendations at discharge:  Please obtain CBC and BMP in 1 week Follow-up with general surgery Follow-up with orthopedic surgery Follow-up with oncology Patient need continuation of talk regarding goals of care.  Discharge Diagnoses: Active Problems:   Effusion of left knee   Primary osteoarthritis of both knees   Urinary retention   Urinary tract infection with hematuria   Type 2 diabetes mellitus without complication, with long-term current use of insulin (HCC)   Anemia of chronic disease   Nodal marginal zone B-cell lymphoma (HCC)   Neutropenic fever (HCC)   Chronic obstructive pulmonary disease (COPD) (HCC)   Goals of care, counseling/discussion   Chronic respiratory failure with hypoxia (HCC)   Pancytopenia (HCC)   Cecal volvulus (HCC)   Palliative care encounter   Hospital Course: 77 y.o. female with medical history significant for type 2 diabetes mellitus, COPD on home O2 at 3 L, postherpetic neuralgia, tobacco use, diastolic CHF with recurrent pleural effusion, gastric lymphoma and well as anemia and thrombocytopenia, hospitalized from 2/20 to 12/17/2022 with left leg cellulitis who presents to the emergency room with complaint of urinary frequency and dysuria and a sensation of incomplete bladder emptying.  At the same time she complains of left knee pain and swelling   3/15: Left knee arthroscopic washout by Ortho, ID consult for suspected septic knee, Foley placed in the ED for acute urinary retention 3/16: US renal pending, plan for hospice at discharge at home, consult oncology for neutropenia 3/17: Had severe abdominal pain later in yesterday, required CT which showed volvulus.  Status post right colectomy, end ileostomy and mucous fistula  3/18: Added Flagyl  and diet 3/19: Still having difficulty controlling her pain, WOC nurse planning on training for ileostomy education, not eating much. 3/20: Continue to have abdominal pain.  Ileostomy is are working.  Palliative care was consulted by oncology as they were now recommending hospice care.  3/21: Vitals stable.  Patient will follow-up with orthopedic surgery in 2 weeks for suture removal. Drains were removed by general surgery today.  Patient refused to accept hospice and would like to continue with treatment although not a current candidate for continuation of cancer management.  Not sure whether she really understand her illness and its trajectory. Antibiotics switched with doxycycline for 2 weeks as recommended by ID.  3/22: Patient remained stable.  Wants to go home with home health which were ordered and arranged.  She does not want to go for hospice, again saying that she still has some hope. She will do doxycycline for 13 more days.  Pain medications were also given to be used as needed.  Patient need to have a close follow-up with orthopedic surgery, general surgery and oncology for further recommendations.  She will continue on current medications and follow-up with her providers for further management.  Patient will remain high risk for deterioration, readmission and mortality based on significant life limiting underlying comorbidities and advanced malignancy.  Oncology recommending hospice but patient is currently refusing and continued to fight although not a candidate for further oncologic treatment.  Assessment and Plan: Effusion of left knee Possible septic joint History of osteoarthritis both knees Continue ceftriaxone and vancomycin, Flagyl added Status post left knee arthroscopy washout by Ortho on 3/15.  Joint fluid sent for  diagnostics including cytology Pain control Follow synovial fluid Gram stain and culture.  So far negative  Urinary tract infection with hematuria Acute  urinary retention Foley catheter was placed in the ED.  removed on 3/18 Renal ultrasound did not show any hydronephrosis or pathology Completed a course of Rocephin  Urinary retention Foley placed in ED, renal ultrasound does not show any hydronephrosis.  Removed Foley on 3/18  Type 2 diabetes mellitus without complication, with long-term current use of insulin (HCC) Continue sliding scale insulin coverage.  Anemia of chronic disease H&H is stable   Neutropenic fever (HCC) Fever curve improving.  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 Janese Banks at the cancer center.  Overall poor prognosis daughter in agreement with keeping her DNR and have hospice follow at discharge.  Chronic obstructive pulmonary disease (COPD) (HCC) Chronic respiratory failure with hypoxia Not acutely exacerbated Continue home inhalers with DuoNebs as needed On room air.  Cecal volvulus ([place]) Confirmed on a CT scan 3/16.  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  Surgery Dr. Dahlia Byes took her to the OR on 3/17 and has undergone colectomy, end ileostomy and mucous fistula.  Started soft diet  On Flagyl, vancomycin and Rocephin for now.  Pancytopenia ([place]) Due to lymphoma.  Oncology seen, status post Granix.  Overall poor prognosis.  Goals of care, counseling/discussion Plan for hospice at home upon discharge.  Patient has 9 readmissions in the last 6 months and 2 ED visits per [facility] record.  Overall poor prognosis.   Pain control - [employer] Controlled Substance Reporting System database was reviewed. and patient was instructed, not to drive, operate heavy machinery, perform activities at heights, swimming or participation in water activities or provide baby-sitting services while on Pain, Sleep and Anxiety Medications; until their outpatient  Physician has advised to do so again. Also recommended to not to take more than prescribed Pain, Sleep and Anxiety Medications.  Consultants: General surgery, urology, orthopedic surgery, oncology, palliative care Procedures performed: Laparotomy with colostomy placement Disposition: Home health Diet recommendation:  Discharge Diet Orders (From admission, onward)     Start     Ordered   01/14/23 0000  Diet - low sodium heart healthy        01/14/23 1017           Cardiac and Carb modified diet DISCHARGE MEDICATION: Allergies as of 01/14/2023   No Known Allergies      Medication List     STOP taking these medications    midodrine 10 MG tablet Commonly known as: PROAMATINE       TAKE these medications    Accu-Chek Softclix Lancets lancets 1 each 3 (three) times daily.   acyclovir 400 MG tablet Commonly known as: ZOVIRAX Take 400 mg by mouth daily.   albuterol 108 (90 Base) MCG/ACT inhaler Commonly known as: VENTOLIN HFA TAKE 2 PUFFS BY MOUTH EVERY 6 HOURS AS NEEDED FOR WHEEZE OR SHORTNESS OF BREATH   apixaban 2.5 MG Tabs tablet Commonly known as: Eliquis Take 1 tablet (2.5 mg total) by mouth 2 (two) times daily.   Blood Glucose Monitoring Suppl Devi 1 each by Does not apply route in the morning, at noon, and at bedtime. May substitute to any manufacturer covered by patient's insurance.   BLOOD GLUCOSE TEST STRIPS Strp 1 each by In Vitro route in  the morning, at noon, and at bedtime. May substitute to any manufacturer covered by patient's insurance.   clobetasol cream 0.05 % Commonly known as: TEMOVATE Apply 1 Application topically daily.   doxycycline 100 MG tablet Commonly known as: VIBRA-TABS Take 1 tablet (100 mg total) by mouth every 12 (twelve) hours for 13 days.   furosemide 20 MG tablet Commonly known as: LASIX Take 1 tablet (20 mg total) by mouth daily as needed for fluid or edema. What changed:  when to take this reasons to take this    guaiFENesin-dextromethorphan 100-10 MG/5ML syrup Commonly known as: ROBITUSSIN DM Take 5 mLs by mouth every 4 (four) hours as needed for cough.   insulin NPH-regular Human (70-30) 100 UNIT/ML injection Inject 4 Units into the skin 2 (two) times daily with a meal. Use only for hyperglycemia, CBG+ 300. Start with 5 units, titrate up to max 10 unit per dose if need.   ipratropium-albuterol 0.5-2.5 (3) MG/3ML Soln Commonly known as: DUONEB Take 3 mLs by nebulization every 6 (six) hours as needed (shortness of breath or wheezing).   lenalidomide 5 MG capsule Commonly known as: REVLIMID Take 1 capsule (5 mg total) by mouth daily. Take for 21 days, then hold for 7 days. Repeat every 28 days.   levothyroxine 50 MCG tablet Commonly known as: SYNTHROID Take 1 tablet (50 mcg total) by mouth daily at 6 (six) AM.   loperamide 2 MG capsule Commonly known as: IMODIUM Take 2 mg by mouth as needed for diarrhea or loose stools.   nystatin-triamcinolone ointment Commonly known as: MYCOLOG Apply 1 Application topically 2 (two) times daily.   ondansetron 8 MG tablet Commonly known as: ZOFRAN Take 8 mg by mouth every 8 (eight) hours as needed for nausea, vomiting or refractory nausea / vomiting. Take 1 tablet q6H for 4 days after chemotherapy.   oxyCODONE 5 MG immediate release tablet Commonly known as: Oxy IR/ROXICODONE Take 1 tablet (5 mg total) by mouth every 4 (four) hours as needed for moderate pain.   pantoprazole 40 MG tablet Commonly known as: Protonix Take 1 tablet (40 mg total) by mouth daily.   prochlorperazine 10 MG tablet Commonly known as: COMPAZINE Take 1 tablet by mouth every 6 (six) hours as needed for nausea or vomiting. Take 1 tab q6H for 4 days after chemotherapy.   simethicone 80 MG chewable tablet Commonly known as: MYLICON Chew 99991111 mg by mouth every 6 (six) hours as needed for flatulence. Take 1 capsule PO PRN gas relief.   traMADol 50 MG tablet Commonly known as:  ULTRAM Take 1 tablet (50 mg total) by mouth every 6 (six) hours as needed for moderate pain or severe pain.   Vemlidy 25 MG Tabs Generic drug: Tenofovir Alafenamide Fumarate Take 1 tablet (25 mg total) by mouth daily.               Discharge Care Instructions  (From admission, onward)           Start     Ordered   01/14/23 0000  Discharge wound care:       Comments: Apply Xeroform gauze to bilat legs Q day, then cover with ABD pads and kerlex, then ace wrap in a spiral fashion, beginning just behind toes to below knee.  Will also need colostomy care.   01/14/23 1017            Follow-up Information     Tomasita Morrow, NP .   Specialty: Nurse Practitioner Contact  information: [place] [address] [place] [address] [phone]         Poggi, Marshall Cork, MD .   Specialty: Orthopedic Surgery Contact information: [place] [address] [phone]         Billey Co, MD .   Specialty: Urology Why: Urology Contact information: [place] [address] [phone]         Corky Mull, MD Follow up in 14 day(s).   Specialty: Orthopedic Surgery Why: Follow-up with PA at [facility] for suture removal from the left knee. Contact information: [place] [address] [phone]         Tylene Fantasia, PA-C Follow up on 01/20/2023.   Specialty: Physician Assistant Contact information: [address] [place] [address] [phone]                Discharge Exam: Danley Danker Weights   01/06/23 1917  Weight: 65.8 kg   General.  Frail elderly lady, in no acute distress. Pulmonary.  Lungs clear bilaterally, normal respiratory effort. CV.  Regular rate and rhythm, no JVD, rub or murmur. Abdomen.  Soft, nontender, nondistended, BS positive. CNS.  Alert and oriented .  No focal neurologic  deficit.  Colostomy bags in place Extremities.  No edema, no cyanosis, pulses intact and symmetrical. Psychiatry.  Judgment and insight appears normal.   Condition at discharge: stable  The results of significant diagnostics from this hospitalization (including imaging, microbiology, ancillary and laboratory) are listed below for reference.   Imaging Studies: CT ABDOMEN PELVIS W CONTRAST  Addendum Date: 01/12/2023   ADDENDUM REPORT: 01/12/2023 00:33 ADDENDUM: Vascular/lymphatic: Retroperitoneal adenopathy improved since prior study. Portacaval lymph node on image 45 has a short axis diameter of 1.5 mm compared to 2.3 cm previously. Left periaortic lymph node has a short axis diameter of 1.3 cm compared to 1.7 cm previously. Right inguinal lymph node has a short axis diameter of 1.2 cm compared to 1.8 cm previously. Electronically Signed   By: Rolm Baptise M.D.   On: 01/12/2023 00:33   Result Date: 01/12/2023 CLINICAL DATA:  Abdominal pain, acute, nonlocalized. Urinary retention. EXAM: CT ABDOMEN AND PELVIS WITH CONTRAST TECHNIQUE: Multidetector CT imaging of the abdomen and pelvis was performed using the standard protocol following bolus administration of intravenous contrast. RADIATION DOSE REDUCTION: This exam was performed according to the departmental dose-optimization program which includes automated exposure control, adjustment of the mA and/or kV according to patient size and/or use of iterative reconstruction technique. CONTRAST:  128mL OMNIPAQUE IOHEXOL 300 MG/ML  SOLN COMPARISON:  11/07/2022 FINDINGS: Lower chest: Bilateral pleural effusions, right larger than left, similar to prior study. Compressive atelectasis in the lower lobes. Heart is normal size. Hepatobiliary: At least 1 gallstone seen within the gallbladder measuring 1.5 cm. No focal hepatic abnormality or biliary ductal dilatation. Mild hepatomegaly with a craniocaudal length of 20 cm. Pancreas: No focal abnormality or ductal  dilatation. Spleen: No focal abnormality. Splenomegaly with a craniocaudal length of 16 cm. Adrenals/Urinary Tract: Foley catheter in the bladder which is decompressed. No renal or adrenal mass. No hydronephrosis. Stomach/Bowel: Dilated segment of large bowel in the right abdomen with air-fluid level and layering stool. On coronal imaging, there is a swirled appearance leading to this segment of large bowel. 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 Small bowel decompressed as is the stomach. Vascular/Lymphatic: No evidence of aneurysm or adenopathy. Aortic atherosclerosis.  Reproductive: Uterus and adnexa unremarkable.  No mass. Other: Diffuse edema throughout the subcutaneous soft tissues compatible with anasarca. Small amount of free fluid in the abdomen and pelvis. Musculoskeletal: No acute bony abnormality. IMPRESSION: Dilated right colon with air-fluid level in the right upper abdomen. Swirled appearance noted on coronal imaging leading to this loop of bowel. 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 Hepatosplenomegaly. Small amount of free fluid in the abdomen and pelvis. Moderate to large bilateral effusions, right greater than left. These results will be called to the ordering clinician or representative by the Radiologist Assistant, and communication documented in the PACS or [employer]. Electronically Signed: By: Rolm Baptise M.D. On: 01/08/2023 21:20   DG ABD ACUTE 2+V W 1V CHEST  Result Date: 01/09/2023 CLINICAL DATA:  Cecal volvulus EXAM: DG ABDOMEN ACUTE WITH 1 VIEW CHEST COMPARISON:  CT from the previous day FINDINGS: Cardiac shadow is stable. Bilateral pleural effusions are again seen. Right chest wall port is again noted. Central consolidation/scarring in right lung is again seen. No pneumothorax is noted. No free air is seen. Persistent dilatation of the cecum and ascending colon is noted against highly suspicious for cecal volvulus. The overall appearance is similar to that seen on the  previous day. Contrast is noted in the more proximal small bowel. No definitive distal colonic contrast is seen. IMPRESSION: Stable appearance of cecal volvulus. Bilateral pleural effusions and persistent changes in the right lung consistent of consolidation and scarring. Electronically Signed   By: Inez Catalina M.D.   On: 01/09/2023 11:24   US RENAL  Result Date: 01/08/2023 CLINICAL DATA:  [id number] Urine retention [id number] EXAM: RENAL / URINARY TRACT ULTRASOUND COMPLETE COMPARISON:  CT 11/07/2022 FINDINGS: Right Kidney: Renal measurements: 8.2 x 3.7 x 3.6 cm = volume: 57.2 mL. No hydronephrosis. Mildly increased renal cortical echogenicity. Left Kidney: Renal measurements: 9.2 x 3.6 x 4.7 cm = volume: 81.3 mL. No hydronephrosis. Mildly increased renal cortical echogenicity. Bladder: Decompressed with Foley catheter in place. Other: Bilateral pleural effusions. IMPRESSION: No hydronephrosis. Mildly increased renal cortical echogenicity bilaterally, as can be seen in medical renal disease. Bladder is decompressed with Foley catheter in place. Bilateral pleural effusions noted. Electronically Signed   By: Maurine Simmering M.D.   On: 01/08/2023 18:13   DG Knee Complete 4 Views Left  Result Date: 01/06/2023 CLINICAL DATA:  Pain and swelling of left knee EXAM: LEFT KNEE - COMPLETE 4+ VIEW COMPARISON:  12/14/2022 FINDINGS: Frontal, bilateral oblique, lateral views of the left knee are obtained. No acute fracture, subluxation, or dislocation. Interval development of large left knee effusion. Stable 3 compartmental osteoarthritis. There is progressive diffuse subcutaneous edema surrounding the left knee and throughout the visualized left lower extremity. IMPRESSION: 1. Interval development of large left knee effusion. 2. Diffuse subcutaneous edema throughout the visualized left leg, which has progressed since prior study. 3. No acute bony abnormality. 4. Stable 3 compartmental osteoarthritis. Electronically Signed   By:  Randa Ngo M.D.   On: 01/06/2023 22:32   DG Chest 2 View  Result Date: 12/15/2022 CLINICAL DATA:  Shortness of breath, increased EXAM: CHEST - 2 VIEW COMPARISON:  12/07/2022 FINDINGS: RIGHT jugular Port-A-Cath with tip projecting over cavoatrial junction. Enlargement of cardiac silhouette with pulmonary vascular congestion. Diffuse infiltrates consistent with pulmonary edema. Interval increase in BILATERAL pleural effusions and bibasilar atelectasis. No pneumothorax or acute osseous findings. IMPRESSION: Increased pulmonary edema and bibasilar pleural effusions/atelectasis. Electronically Signed   By: Lavonia Dana M.D.   On: 12/15/2022 16:27  Microbiology: Results for orders placed or performed during the hospital encounter of 01/06/23  Body fluid culture w Gram Stain     Status: None   Collection Time: 01/06/23 11:45 PM   Specimen: KNEE; Body Fluid  Result Value Ref Range Status   Specimen Description   Final    KNEE Performed at [facility], [address] [place], [place] [address]    Special Requests   Final    NONE Performed at [facility], [address] [place], [place] [address]    Gram Stain   Final    FEW WBC PRESENT,BOTH PMN AND MONONUCLEAR NO ORGANISMS SEEN    Culture   Final    NO GROWTH 3 DAYS Performed at [facility], [place] [address], [place], [place] [address]    Report Status 01/10/2023 FINAL  Final  Blood culture (routine x 2)     Status: None   Collection Time: 01/07/23  2:55 AM   Specimen: BLOOD  Result Value Ref Range Status   Specimen Description   Final    BLOOD BLOOD RIGHT ARM Performed at [facility], [address], [place], [place] [address]    Special Requests   Final    BOTTLES DRAWN AEROBIC AND ANAEROBIC Blood Culture results may not be optimal due to an inadequate volume of blood received in culture bottles Performed at [facility], [address] [place], [place] [address]    Culture    Final    NO GROWTH 5 DAYS Performed at [facility], [place] [address] [place], [place] [address]    Report Status 01/12/2023 FINAL  Final  Blood culture (routine x 2)     Status: None   Collection Time: 01/07/23  2:55 AM   Specimen: BLOOD  Result Value Ref Range Status   Specimen Description   Final    BLOOD BLOOD LEFT ARM Performed at [facility], [address] [place], [place] [address]    Special Requests   Final    BOTTLES DRAWN AEROBIC AND ANAEROBIC Blood Culture adequate volume Performed at [facility], [address] [place], [place] [address]    Culture   Final    NO GROWTH 5 DAYS Performed at [facility], [place] [address], [place], [place] [address]    Report Status 01/12/2023 FINAL  Final  Aerobic/Anaerobic Culture w Gram Stain (surgical/deep wound)     Status: None   Collection Time: 01/07/23  1:36 PM   Specimen: PATH Other; Body Fluid  Result Value Ref Range Status   Specimen Description SYNOVIAL  Final   Special Requests LEFT KNEE SWAB  Final   Gram Stain   Final    RARE WBC PRESENT,BOTH PMN AND MONONUCLEAR NO ORGANISMS SEEN    Culture   Final    No growth aerobically or anaerobically. Performed at [facility], [place] [address], [place], [place] [address]    Report Status 01/13/2023 FINAL  Final  Aerobic/Anaerobic Culture w Gram Stain (surgical/deep wound)     Status: None   Collection Time: 01/07/23  1:42 PM   Specimen: PATH Other; Body Fluid  Result Value Ref Range Status   Specimen Description   Final    SYNOVIAL Performed at [facility], [address], [place],  [address]    Special Requests   Final    LEFT KNEE Performed at [facility], [place], [place]  [id number]    Gram Stain   Final    MODERATE WBC PRESENT,BOTH PMN AND MONONUCLEAR NO ORGANISMS SEEN    Culture   Final    No growth aerobically or anaerobically. Performed at [facility], [place] [address] [place], [place] [address]    Report Status 01/13/2023 FINAL  Final    Labs: CBC: Recent Labs  Lab 01/09/23 0600 01/10/23 0344 01/10/23 0347 01/11/23 0449 01/12/23 0803  WBC 1.0* 0.5* 0.5* 1.4* 1.9*  NEUTROABS  --  0.1*  --   --   --   HGB 9.3* 11.2* 11.2* 10.2* 9.8*  HCT 29.6* 36.1 35.8* 33.2* 32.9*  MCV 86.3 85.5 85.0 85.8 88.0  PLT 152 211 198 190 0000000   Basic Metabolic Panel: Recent Labs  Lab 01/08/23 0342 01/09/23 0600 01/10/23 0347 01/11/23 0449 01/12/23 0803  NA 138 134* 137 138 136  K 3.8 3.6 4.3 3.7 4.0  CL 97* 98 98 99 104  CO2 30 31 28 31 28   GLUCOSE 294* 83 203* 132* 83  BUN 36* 35* 33* 24* 20  CREATININE 1.03* 0.85 0.79 0.73 0.73  CALCIUM 8.1* 7.7* 7.5* 7.8* 7.4*   Liver Function Tests: Recent Labs  Lab 01/09/23 0600  AST 13*  ALT 8  ALKPHOS 62  BILITOT 1.2  PROT 4.9*  ALBUMIN 2.5*   CBG: Recent Labs  Lab 01/13/23 1612 01/13/23 2006 01/13/23 2357 01/14/23 0409 01/14/23 0808  GLUCAP 135* 153* 87 107* 102*    Discharge time spent: greater than 30 minutes.  This record has been created using Systems analyst. Errors have been sought and corrected,but may not always be located. Such creation errors do not reflect on the standard of care.   Signed: Lorella Nimrod, MD [facility] 01/14/2023

## 2023-01-14 NOTE — Care Management Important Message (Signed)
Important Message  Patient Details  Name: Natasha [name] MRN: [id number] Date of Birth: Sep 18, 1946   Medicare Important Message Given:  Yes  Reviewed Medicare IM with patient via room phone [phone]).  Copy of Medicare IM mailed to home address on chart.    Dannette Barbara 01/14/2023, 11:50 AM

## 2023-01-17 ENCOUNTER — Emergency Department
Admission: EM | Admit: 2023-01-17 | Discharge: 2023-01-17 | Disposition: A | Discharge status: Home / Self Care | Payer: 59 | Attending: Emergency Medicine | Admitting: Emergency Medicine

## 2023-01-17 ENCOUNTER — Other Ambulatory Visit: Payer: Self-pay

## 2023-01-17 ENCOUNTER — Encounter: Payer: Self-pay | Admitting: Emergency Medicine

## 2023-01-17 ENCOUNTER — Emergency Department: Payer: 59

## 2023-01-17 DIAGNOSIS — E1122 Type 2 diabetes mellitus with diabetic chronic kidney disease: Secondary | ICD-10-CM | POA: Diagnosis not present

## 2023-01-17 DIAGNOSIS — Z8616 Personal history of COVID-19: Secondary | ICD-10-CM | POA: Diagnosis not present

## 2023-01-17 DIAGNOSIS — L03311 Cellulitis of abdominal wall: Secondary | ICD-10-CM | POA: Diagnosis not present

## 2023-01-17 DIAGNOSIS — R6889 Other general symptoms and signs: Secondary | ICD-10-CM | POA: Diagnosis not present

## 2023-01-17 DIAGNOSIS — R109 Unspecified abdominal pain: Secondary | ICD-10-CM | POA: Diagnosis present

## 2023-01-17 DIAGNOSIS — E039 Hypothyroidism, unspecified: Secondary | ICD-10-CM | POA: Insufficient documentation

## 2023-01-17 DIAGNOSIS — Z743 Need for continuous supervision: Secondary | ICD-10-CM | POA: Diagnosis not present

## 2023-01-17 DIAGNOSIS — Z8572 Personal history of non-Hodgkin lymphomas: Secondary | ICD-10-CM | POA: Insufficient documentation

## 2023-01-17 DIAGNOSIS — R162 Hepatomegaly with splenomegaly, not elsewhere classified: Secondary | ICD-10-CM | POA: Diagnosis not present

## 2023-01-17 DIAGNOSIS — Z85028 Personal history of other malignant neoplasm of stomach: Secondary | ICD-10-CM | POA: Insufficient documentation

## 2023-01-17 DIAGNOSIS — Z794 Long term (current) use of insulin: Secondary | ICD-10-CM | POA: Diagnosis not present

## 2023-01-17 DIAGNOSIS — J449 Chronic obstructive pulmonary disease, unspecified: Secondary | ICD-10-CM | POA: Diagnosis not present

## 2023-01-17 DIAGNOSIS — N1831 Chronic kidney disease, stage 3a: Secondary | ICD-10-CM | POA: Insufficient documentation

## 2023-01-17 DIAGNOSIS — I509 Heart failure, unspecified: Secondary | ICD-10-CM | POA: Insufficient documentation

## 2023-01-17 DIAGNOSIS — K9403 Colostomy malfunction: Secondary | ICD-10-CM | POA: Diagnosis not present

## 2023-01-17 LAB — COMPREHENSIVE METABOLIC PANEL
ALT: 11 U/L (ref 0–44)
AST: 25 U/L (ref 15–41)
Albumin: 2.5 g/dL — ABNORMAL LOW (ref 3.5–5.0)
Alkaline Phosphatase: 107 U/L (ref 38–126)
Anion gap: 4 — ABNORMAL LOW (ref 5–15)
BUN: 15 mg/dL (ref 8–23)
CO2: 30 mmol/L (ref 22–32)
Calcium: 8 mg/dL — ABNORMAL LOW (ref 8.9–10.3)
Chloride: 104 mmol/L (ref 98–111)
Creatinine, Ser: 0.65 mg/dL (ref 0.44–1.00)
GFR, Estimated: >60 mL/min (ref >60)
Glucose, Bld: 179 mg/dL — ABNORMAL HIGH (ref 70–99)
Potassium: 4.5 mmol/L (ref 3.5–5.1)
Sodium: 138 mmol/L (ref 135–145)
Total Bilirubin: 0.8 mg/dL (ref 0.3–1.2)
Total Protein: 4.8 g/dL — ABNORMAL LOW (ref 6.5–8.1)

## 2023-01-17 LAB — CBC WITH DIFFERENTIAL/PLATELET
Abs Immature Granulocytes: 1.5 10*3/uL — ABNORMAL HIGH (ref 0.00–0.07)
Basophils Absolute: 0.1 10*3/uL (ref 0.0–0.1)
Basophils Relative: 1 %
Eosinophils Absolute: 0.5 10*3/uL (ref 0.0–0.5)
Eosinophils Relative: 5 %
HCT: 31 % — ABNORMAL LOW (ref 36.0–46.0)
Hemoglobin: 9.6 g/dL — ABNORMAL LOW (ref 12.0–15.0)
Immature Granulocytes: 13 %
Lymphocytes Relative: 15 %
Lymphs Abs: 1.7 10*3/uL (ref 0.7–4.0)
MCH: 26.7 pg (ref 26.0–34.0)
MCHC: 31 g/dL (ref 30.0–36.0)
MCV: 86.4 fL (ref 80.0–100.0)
Monocytes Absolute: 0.8 10*3/uL (ref 0.1–1.0)
Monocytes Relative: 7 %
Neutro Abs: 6.6 10*3/uL (ref 1.7–7.7)
Neutrophils Relative %: 59 %
Platelets: 230 10*3/uL (ref 150–400)
RBC: 3.59 MIL/uL — ABNORMAL LOW (ref 3.87–5.11)
RDW: 15.8 % — ABNORMAL HIGH (ref 11.5–15.5)
Smear Review: NORMAL
WBC: 11.2 10*3/uL — ABNORMAL HIGH (ref 4.0–10.5)
nRBC: 0 % (ref 0.0–0.2)

## 2023-01-17 MED ORDER — CEPHALEXIN 500 MG PO CAPS
500.0000 mg | ORAL_CAPSULE | Freq: Once | ORAL | Status: AC
  Administered 2023-01-17: 500 mg via ORAL
  Filled 2023-01-17: qty 1

## 2023-01-17 MED ORDER — IOHEXOL 300 MG/ML  SOLN
100.0000 mL | Freq: Once | INTRAMUSCULAR | Status: AC | PRN
  Administered 2023-01-17: 100 mL via INTRAVENOUS

## 2023-01-17 MED ORDER — CEPHALEXIN 500 MG PO CAPS: 500.0000 mg | ORAL_CAPSULE | Freq: Four times a day (QID) | ORAL | 0 refills | Status: DC

## 2023-01-17 MED ORDER — HEPARIN SOD (PORK) LOCK FLUSH 10 UNIT/ML IV SOLN
10.0000 [IU] | Freq: Once | INTRAVENOUS | Status: AC
  Administered 2023-01-17: 10 [IU]
  Filled 2023-01-17: qty 5

## 2023-01-17 MED ORDER — CEPHALEXIN 500 MG PO CAPS: 500.0000 mg | ORAL_CAPSULE | Freq: Four times a day (QID) | ORAL | 0 refills | Status: AC

## 2023-01-17 NOTE — ED Notes (Signed)
Pt given a lunch meal per her request so that the "medicine does not hurt my stomach."  She is very thankful.  Pt also given clean scrubs to travel home in.  Dirty clothing placed in bags and given to pt.

## 2023-01-17 NOTE — ED Triage Notes (Signed)
Per EMS, pt from home c/o post op surgical problems including colostomy bag is leaking and "my stiches are coming out."  Pt does have staples on her stomach.  Pt d/c for hospital on Friday, RN noticed port is still accessed.  98% on home O2 131/66 82 HR

## 2023-01-17 NOTE — ED Provider Notes (Signed)
[facility] Provider Note    Event Date/Time   First MD Initiated Contact with Patient 01/17/23 1921     (approximate)   History   Post-op Problem   HPI  Natasha [name] is a 77 y.o. 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  Patient recently had prolonged admission for possible septic arthritis of the left knee which was then complicated by cecal volvulus requiring colostomy.  She was just discharged 3 days ago.  Presents today because the ostomy is leaking.  Had 1 visit with home health but her daughters are helping her at home.  She says that the ostomy is busting but is just actually leaking.  They have had to change 4 bags.  She also is concerned that her staples are loose.  She denies fevers chills.  Does endorse abdominal pain that is new since leaving the hospital.  No fevers no nausea vomiting.   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   Urinary retention 01/07/2023   Effusion of left knee 01/07/2023   Urinary tract infection with hematuria 01/07/2023   Pancytopenia ([place]) 12/17/2022   Left leg cellulitis 12/14/2022   Acute GI bleeding 11/12/2022   Acute blood loss anemia 11/11/2022   Hemorrhagic shock ([place]) 11/07/2022   Overweight (BMI 25.0-29.9) 10/21/2022   AKI (acute kidney injury) ([place]) 10/21/2022   Anemia associated with chemotherapy 10/21/2022   Influenza A with pneumonia 10/20/2022    Neutropenic fever ([place]) 10/20/2022   Myocardial injury 10/20/2022   Type II diabetes mellitus with renal manifestations ([place]) 10/20/2022   Iron deficiency anemia 09/19/2022   COPD with acute exacerbation ([place]) 08/29/2022   Type 2 diabetes mellitus without complication, with long-term current use of insulin ([place]) 08/29/2022   Normocytic anemia 08/29/2022   Severe sepsis ([place]) 08/29/2022   Chronic respiratory failure with hypoxia ([place]) 08/16/2022   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   Swelling of lower extremity 07/24/2022   Hypotension 07/24/2022   Goals of care, counseling/discussion    COVID-19 virus infection 07/14/2022   Anemia of chronic disease 07/14/2022   Thrombocytopenia ([place]) 07/14/2022   Hypothyroidism, unspecified 07/14/2022   Chronic kidney disease, stage 3a ([place]) 07/14/2022   Bilateral pleural effusion 07/12/2022   Diabetes mellitus, type II ([place]) 07/12/2022   Acute on chronic respiratory failure with hypoxia and hypercarbia ([place]) 07/12/2022   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/12/2022   Lower extremity edema 05/12/2022   Other pancytopenia ([place]) 04/16/2022   Type 2 diabetes mellitus with proteinuria ([place]) 04/09/2022   Atherosclerosis of aorta ([place]) 04/09/2022   Chronic obstructive pulmonary disease (COPD) ([place]) 04/09/2022   SOB (shortness of breath) on exertion 04/09/2022   Acute cough 04/09/2022   Post herpetic neuralgia 03/14/2020   Gall stones 10/23/2019   Lymphoma ([place]) 10/23/2019   Generalized lymphadenopathy 09/08/2018  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 --      Excl. in [place]? --     Most recent vital signs: Vitals:   01/17/23 1928  BP: (!) 141/62  Pulse: 74  Resp: 20  Temp: 98.3 F (36.8 C)  SpO2: 100%     General: Awake, no distress. CV:  Good peripheral perfusion.  Resp:  Normal effort.  Abd:  No distention.  Fuhs abdominal tenderness without guarding Neuro:             Awake, Alert, Oriented x 3  Other:  Is pink, colostomy bag is leaking at the inferior portion, midline surgical incision with staples still in place, there is minimal serous drainage from the superior portion, there is diffuse erythema along the abdominal wall that blanches   ED Results / Procedures / Treatments  Labs (all labs ordered are listed, but only abnormal results are displayed) Labs Reviewed  COMPREHENSIVE METABOLIC PANEL - Abnormal; Notable for the following components:      Result Value   Glucose, Bld 179 (*)    Calcium 8.0 (*)    Total Protein 4.8 (*)    Albumin 2.5 (*)    Anion gap 4 (*)    All other components within normal limits  CBC WITH DIFFERENTIAL/PLATELET - Abnormal; Notable for the following components:   WBC 11.2 (*)    RBC 3.59 (*)    Hemoglobin 9.6 (*)    HCT 31.0 (*)    RDW 15.8 (*)    Abs Immature Granulocytes 1.50 (*)    All other components within normal limits     EKG    RADIOLOGY    PROCEDURES:  Critical Care performed: No  Procedures  The patient is on the cardiac monitor to evaluate for evidence of arrhythmia and/or significant heart rate changes.   MEDICATIONS ORDERED IN ED: Medications  cephALEXin (KEFLEX) capsule 500 mg (has no administration in time range)  iohexol (OMNIPAQUE) 300 MG/ML solution 100 mL (100 mLs Intravenous Contrast Given 01/17/23 2044)     IMPRESSION / MDM / ASSESSMENT AND PLAN / ED COURSE  I  reviewed the triage vital signs and the nursing notes.                              Patient's presentation is most consistent with acute complicated illness / injury requiring diagnostic workup.  Differential diagnosis includes, but is not limited to, abdominal wall cellulitis, seroma, bowel obstruction, postoperative pain   77 year old female with complex past medical history recently admitted for rule out septic arthritis of the left  knee which was complicated by cecal volvulus she developed during hospitalization requiring formation colostomy presents because ostomy bag is leaking and she is concerned about her staples being loose.  Patient is also endorsing abdominal pain that is new since leaving the hospital.  No fevers at home.  Patient's vital signs are reassuring.  The ostomy looks pink and healthy.  Colostomy bag is at the inferior portion.  This has been an issue for her at home multiple times.  Around her surgical incision where staples are still in place there is some minimal serous drainage and there appears to be a mild cellulitis of the abdominal wall.  Abdomen is diffusely tender.  Plan obtain a CT of the abdomen pelvis given her abdominal tenderness.  She is on doxycycline currently will likely need addition Keflex for the abdominal wall cellulitis.  I have asked nursing to troubleshoot her colostomy bag.  CT abdomen and pelvis does not show any acute findings in the abdomen.  Patient does have diffuse anasarca which I suspect is contributing to the erythema on her abdominal wall.  Does have large pleural effusions which are chronic.  No compressive consolidation/atelectasis which patient has had before.  She has no new cardiopulmonary symptoms I have low suspicion for bacterial pneumonia.  Nursing did change patient's ostomy.  I see is from social work notes during her admission that she will be getting home health which should hopefully help with her ostomy and the supplies.  I have  put in a social work consult to follow-up with the patient tomorrow to figure out the ostomy supplies.         FINAL CLINICAL IMPRESSION(S) / ED DIAGNOSES   Final diagnoses:  Abdominal wall cellulitis     Rx / DC Orders   ED Discharge Orders          Ordered    cephALEXin (KEFLEX) 500 MG capsule  4 times daily,   Status:  Discontinued        01/17/23 2136    cephALEXin (KEFLEX) 500 MG capsule  4 times daily        01/17/23 2136             Note:  This document was prepared using Dragon voice recognition software and may include unintentional dictation errors.   Rada Hay, MD 01/17/23 [phone]

## 2023-01-17 NOTE — ED Notes (Addendum)
When RN assessed pt, she found that her port was accessed.  According to the tag attached to the port access tubing it was accessed on 01/10/23.  Pt stated she left the hospital w/ the port accessed but it has not been used since last Friday.  RN Soil scientist.

## 2023-01-17 NOTE — ED Notes (Signed)
RN changed pt's colostomy bag and provided a lot of education on care.  Pt was taught about burping the bag and emptying the bag.  As soon as the bag was removed she had a moderate amount of stool released.  She stated that her stomach feels less bloated.  Skin around the stoma was very red and raw, sore and very tender to the touch as it was being cleaned.    Pt was given a partial bed bath as she had old stool all over her arms and legs.  Pt appeared to not understand how to care for colostomy and when asked who does understand in the home she stated it was her Granddaughter who might not be much help in the near future as she is having a baby tomorrow.  She also states that she only has two bags left at home.  She has had to use several bags b/c they keep leaking.  RN explained that the bags needed to be burped and emptied.

## 2023-01-17 NOTE — ED Notes (Signed)
Provider documented access and placed picture in the chart.  RN was able to flush the port w/ NS and then completed the heparin flush.  RN removed the port access tubing.  Site did not appear red or dirty.

## 2023-01-17 NOTE — ED Notes (Signed)
During discharge RN and pt spoke about the importance of making contact w/ the social worker when they tried to call her.  Pt stated RN verified phone numbers:  Pt's phone number - [phone] Pt's daughter's number Lenon Oms) [phone]

## 2023-01-19 ENCOUNTER — Telehealth: Payer: Self-pay

## 2023-01-19 ENCOUNTER — Other Ambulatory Visit (HOSPITAL_COMMUNITY): Payer: Self-pay

## 2023-01-19 DIAGNOSIS — I5033 Acute on chronic diastolic (congestive) heart failure: Secondary | ICD-10-CM | POA: Diagnosis not present

## 2023-01-19 DIAGNOSIS — N1831 Chronic kidney disease, stage 3a: Secondary | ICD-10-CM | POA: Diagnosis not present

## 2023-01-19 NOTE — Telephone Encounter (Signed)
        Patient  visited [facility] on 01/17/2023  for Post-op Problem.   Telephone encounter attempt :  1st  Unable to leave message busy/no answer.   [place] Resource Care Guide   [email or web address]  ?? [id number]   Website: [email or web address]  [email or web address]

## 2023-01-19 NOTE — Telephone Encounter (Signed)
Entered in error

## 2023-01-20 ENCOUNTER — Inpatient Hospital Stay: Payer: Medicaid Other | Admitting: Nurse Practitioner

## 2023-01-20 ENCOUNTER — Telehealth: Payer: Self-pay

## 2023-01-20 ENCOUNTER — Encounter: Payer: 59 | Admitting: Physician Assistant

## 2023-01-20 NOTE — Telephone Encounter (Signed)
Called Patient to check on her due to her not showing up for her hospital follow up again. She has no showed at least 4 times. Patient's daughter stated "she is not here but hospice came in and a hospice dr comes and takes care of her now." I asked her if that is what she said to be sure and she said a hospice doctor comes out to the home and takes care of her.

## 2023-01-20 NOTE — Telephone Encounter (Signed)
        Patient  visited [facility] on 01/17/2023  for Post-op Problem.   Telephone encounter attempt :  2nd  No answer/busy unable to leave message.   [place] Resource Care Guide   [email or web address]  ?? [id number]   Website: [email or web address]  [email or web address]

## 2023-01-25 ENCOUNTER — Other Ambulatory Visit: Payer: Self-pay

## 2023-01-25 ENCOUNTER — Ambulatory Visit: Payer: Self-pay

## 2023-01-25 NOTE — Patient Outreach (Signed)
  Care Coordination   Follow Up Visit Note   01/25/2023 Name: Danyele Cullipher Novosad MRN: [id number] DOB: 1945/11/02  Prentice Docker Dominik is a 77 y.o. year old female who sees No primary care provider on file. for primary care. I  spoke with daughter, [name].  What matters to the patients health and wellness today?  Daughter states patient is currently at hospice house.  She states patient is scheduled to transfer to home today with ongoing hospice care.     Goals Addressed             This Visit's Progress    Patient / caregiver stated:  Management of health conditions       Interventions Today    Flowsheet Row Most Recent Value  Chronic Disease   Chronic disease during today's visit Chronic Obstructive Pulmonary Disease (COPD), Diabetes, Congestive Heart Failure (CHF), Other  [Gastric lymphoma, s/p left knee sepsis]  General Interventions   General Interventions Discussed/Reviewed General Interventions Reviewed  [Evaluation of current treatment plan related to mentioned health conditions and patients adherence to plan as established by provider.  Assessed for home health services, colostomy supplies, provider follow up visits.]                    SDOH assessments and interventions completed:  No     Care Coordination Interventions:  Yes, provided   Follow up plan:  Plan to follow up with daughter within 2 business days to confirm patient transferred to home and hospice in place    Encounter Outcome:  Pt. Visit Completed   Quinn Plowman RN,BSN,CCM [place] [phone] direct line

## 2023-01-26 ENCOUNTER — Inpatient Hospital Stay: Payer: 59

## 2023-01-26 ENCOUNTER — Inpatient Hospital Stay: Payer: 59 | Admitting: Oncology

## 2023-01-26 ENCOUNTER — Ambulatory Visit: Payer: Medicaid Other

## 2023-01-27 ENCOUNTER — Other Ambulatory Visit: Payer: Self-pay | Admitting: *Deleted

## 2023-01-27 DIAGNOSIS — C8300 Small cell B-cell lymphoma, unspecified site: Secondary | ICD-10-CM

## 2023-01-28 ENCOUNTER — Telehealth: Payer: Self-pay

## 2023-01-28 NOTE — Patient Outreach (Signed)
  Care Coordination   Follow Up Visit Note   01/28/2023 Name: Natasha [name] MRN: [id number] DOB: 04-Dec-1945  Natasha [name] is a 77 y.o. year old female who sees No primary care provider on file. for primary care. I  spoke with Daughter Natasha [name] today.   What matters to the patients health and wellness today?  Daughter states patient is home with Hospice care. No further needs at this time. Care coordination services/ program complete.     Goals Addressed             This Visit's Progress    COMPLETED: Patient / caregiver stated:  Management of health conditions       Interventions Today    Flowsheet Row Most Recent Value  Chronic Disease   Chronic disease during today's visit Chronic Obstructive Pulmonary Disease (COPD), Congestive Heart Failure (CHF), Diabetes  General Interventions   General Interventions Discussed/Reviewed General Interventions Reviewed  [Confirmed with daughter patient transitioned to home with Hospice. Daughter advised care coordination service/program complete due to service with Hospice. Advised to call primary provider or RNCM if care coordination services needed in the future.]                    SDOH assessments and interventions completed:  No     Care Coordination Interventions:  Yes, provided   Follow up plan: No further intervention required.   Encounter Outcome:  Pt. Visit Completed   George Ina RN,BSN,CCM [facility] Care Coordination [phone] direct line

## 2023-02-02 ENCOUNTER — Telehealth: Payer: Self-pay

## 2023-02-02 NOTE — Patient Outreach (Signed)
  Care Coordination   02/02/2023 Name: Xiola Zablocki Wiacek MRN: [id number] DOB: [birth year]   Care Coordination Outreach Attempts:  Successful telephone outreach to patient's [name].  Daughter confirmed patient transitioned to home and receiving services with Hospice.    Follow Up Plan:  No further outreach attempts will be made at this time due to services being provider by Hospice.  Daughter notified.   Encounter Outcome:  Pt. Visit Completed   Care Coordination Interventions:  No, not indicated    George Ina [facility] Care Coordination [phone] direct line

## 2023-02-03 DIAGNOSIS — R279 Unspecified lack of coordination: Secondary | ICD-10-CM | POA: Diagnosis not present

## 2023-02-03 DIAGNOSIS — Z743 Need for continuous supervision: Secondary | ICD-10-CM | POA: Diagnosis not present

## 2023-02-20 ENCOUNTER — Emergency Department

## 2023-02-20 ENCOUNTER — Emergency Department
Admission: EM | Admit: 2023-02-20 | Discharge: 2023-02-20 | Disposition: A | Discharge status: Home / Self Care | Attending: Emergency Medicine | Admitting: Emergency Medicine

## 2023-02-20 ENCOUNTER — Other Ambulatory Visit: Payer: Self-pay

## 2023-02-20 DIAGNOSIS — I700 Atherosclerosis of aorta: Secondary | ICD-10-CM | POA: Diagnosis not present

## 2023-02-20 DIAGNOSIS — R1031 Right lower quadrant pain: Secondary | ICD-10-CM | POA: Diagnosis not present

## 2023-02-20 DIAGNOSIS — Z743 Need for continuous supervision: Secondary | ICD-10-CM | POA: Diagnosis not present

## 2023-02-20 DIAGNOSIS — I509 Heart failure, unspecified: Secondary | ICD-10-CM | POA: Diagnosis not present

## 2023-02-20 DIAGNOSIS — J449 Chronic obstructive pulmonary disease, unspecified: Secondary | ICD-10-CM | POA: Diagnosis not present

## 2023-02-20 DIAGNOSIS — R1084 Generalized abdominal pain: Secondary | ICD-10-CM | POA: Insufficient documentation

## 2023-02-20 DIAGNOSIS — R109 Unspecified abdominal pain: Secondary | ICD-10-CM | POA: Diagnosis not present

## 2023-02-20 DIAGNOSIS — E119 Type 2 diabetes mellitus without complications: Secondary | ICD-10-CM | POA: Diagnosis not present

## 2023-02-20 DIAGNOSIS — Z794 Long term (current) use of insulin: Secondary | ICD-10-CM | POA: Diagnosis not present

## 2023-02-20 DIAGNOSIS — R58 Hemorrhage, not elsewhere classified: Secondary | ICD-10-CM | POA: Diagnosis not present

## 2023-02-20 LAB — COMPREHENSIVE METABOLIC PANEL
ALT: 20 U/L (ref 0–44)
AST: 16 U/L (ref 15–41)
Albumin: 3.6 g/dL (ref 3.5–5.0)
Alkaline Phosphatase: 138 U/L — ABNORMAL HIGH (ref 38–126)
Anion gap: 5 (ref 5–15)
BUN: 28 mg/dL — ABNORMAL HIGH (ref 8–23)
CO2: 24 mmol/L (ref 22–32)
Calcium: 9.1 mg/dL (ref 8.9–10.3)
Chloride: 109 mmol/L (ref 98–111)
Creatinine, Ser: 0.79 mg/dL (ref 0.44–1.00)
GFR, Estimated: >60 mL/min (ref >60)
Glucose, Bld: 212 mg/dL — ABNORMAL HIGH (ref 70–99)
Potassium: 3.9 mmol/L (ref 3.5–5.1)
Sodium: 138 mmol/L (ref 135–145)
Total Bilirubin: 0.7 mg/dL (ref 0.3–1.2)
Total Protein: 5.6 g/dL — ABNORMAL LOW (ref 6.5–8.1)

## 2023-02-20 LAB — LIPASE, BLOOD: Lipase: 42 U/L (ref 11–51)

## 2023-02-20 LAB — CBC
HCT: 34.4 % — ABNORMAL LOW (ref 36.0–46.0)
Hemoglobin: 10.9 g/dL — ABNORMAL LOW (ref 12.0–15.0)
MCH: 26 pg (ref 26.0–34.0)
MCHC: 31.7 g/dL (ref 30.0–36.0)
MCV: 81.9 fL (ref 80.0–100.0)
Platelets: 177 10*3/uL (ref 150–400)
RBC: 4.2 MIL/uL (ref 3.87–5.11)
RDW: 17.1 % — ABNORMAL HIGH (ref 11.5–15.5)
WBC: 3.7 10*3/uL — ABNORMAL LOW (ref 4.0–10.5)
nRBC: 0 % (ref 0.0–0.2)

## 2023-02-20 MED ORDER — IOHEXOL 300 MG/ML  SOLN
100.0000 mL | Freq: Once | INTRAMUSCULAR | Status: AC | PRN
  Administered 2023-02-20: 100 mL via INTRAVENOUS

## 2023-02-20 NOTE — ED Provider Triage Note (Signed)
Emergency Medicine Provider Triage Evaluation Note  Natasha [name], a 77 y.o. female  was evaluated in triage.  Pt complains of BRB into her RLQ colostomy bag. She notes symptoms over the last few days. She denies FCS, or NV/diarrhea..  Review of Systems  Positive: BRB per stoma Negative: FCS  Physical Exam  BP 110/70 (BP Location: Left Arm)   Pulse 83   Temp 98.4 F (36.9 C) (Oral)   Resp 18   Ht 5\' 5"  (1.651 m)   Wt 65.8 kg   SpO2 100%   BMI 24.14 kg/m  Gen:   Awake, no distress  NAD Resp:  Normal effort CTA MSK:   Moves extremities without difficulty  ABD:  Soft, nontender  Medical Decision Making  Medically screening exam initiated at 3:23 PM.  Appropriate orders placed.  Dustin Flock Hart was informed that the remainder of the evaluation will be completed by another provider, this initial triage assessment does not replace that evaluation, and the importance of remaining in the ED until their evaluation is complete.  Patient to the ED for evaluation of BRB per RLQ stoma.    Lissa Hoard, PA-C 02/20/23 1610

## 2023-02-20 NOTE — Discharge Instructions (Signed)
Please make follow up appointment with GI.

## 2023-02-20 NOTE — ED Triage Notes (Signed)
Pt in via EMS from home. Pt is a hospice pt. EMS was called for possible GI bleed. Pt has black/green stuff in colonoscopy bag.EMS reports pt has pictures of what they saw. Pt normally low BP. Pt also has irritation around ostomy and the bag is leaking.

## 2023-02-20 NOTE — ED Triage Notes (Signed)
Pt here with bleeding from here stoma x2 days. Pt also having abd pain, states her bleeding has been bright red in color. Pt denies NVD. Pt currently has 2 stomas.

## 2023-02-20 NOTE — ED Notes (Signed)
Pt lives at home and not at facility per daughter.

## 2023-02-20 NOTE — ED Provider Notes (Signed)
[facility] Provider Note  Patient Contact: 5:31 PM (approximate)   History   Abdominal Pain   HPI  Natasha [name] is a 77 y.o. female with a history of diabetes, CHF, COPD, B-cell lymphoma presents to the emergency department because of bright red blood in ostomy bag and leaking from ostomy.  Patient was recently admitted in March 2024 for septic arthritis and developed cecal volvulus requiring colostomy.  Patient denies fever and chills but does report that she has diffuse abdominal pain.  No nausea or vomiting. No alleviating measures have been attempted.       Physical Exam   Triage Vital Signs: ED Triage Vitals [02/20/23 1513]  Enc Vitals Group     BP 110/70     Pulse Rate 83     Resp 18     Temp 98.4 F (36.9 C)     Temp Source Oral     SpO2 100 %     Weight 145 lb 1 oz (65.8 kg)     Height 5\' 5"  (1.651 m)     Head Circumference      Peak Flow      Pain Score 7     Pain Loc      Pain Edu?      Excl. in [place]?     Most recent vital signs: Vitals:   02/20/23 1513  BP: 110/70  Pulse: 83  Resp: 18  Temp: 98.4 F (36.9 C)  SpO2: 100%     General: Alert and in no acute distress. Eyes:  PERRL. EOMI. Head: No acute traumatic findings ENT:      Nose: No congestion/rhinnorhea.      Mouth/Throat: Mucous membranes are moist. Neck: No stridor. No cervical spine tenderness to palpation. Cardiovascular:  Good peripheral perfusion Respiratory: Normal respiratory effort without tachypnea or retractions. Lungs CTAB. Good air entry to the bases with no decreased or absent breath sounds. Gastrointestinal: Bowel sounds 4 quadrants. Soft and tender to palpation. No guarding or rigidity. No palpable masses. No distention. No CVA tenderness. Musculoskeletal: Full range of motion to all extremities.  Neurologic:  No gross focal neurologic deficits are appreciated.  Skin:   No rash noted    ED Results / Procedures / Treatments   Labs (all labs  ordered are listed, but only abnormal results are displayed) Labs Reviewed  COMPREHENSIVE METABOLIC PANEL - Abnormal; Notable for the following components:      Result Value   Glucose, Bld 212 (*)    BUN 28 (*)    Total Protein 5.6 (*)    Alkaline Phosphatase 138 (*)    All other components within normal limits  CBC - Abnormal; Notable for the following components:   WBC 3.7 (*)    Hemoglobin 10.9 (*)    HCT 34.4 (*)    RDW 17.1 (*)    All other components within normal limits  LIPASE, BLOOD  URINALYSIS, ROUTINE W REFLEX MICROSCOPIC  APTT        RADIOLOGY  I personally viewed and evaluated these images as part of my medical decision making, as well as reviewing the written report by the radiologist.  ED Provider Interpretation: No acute abnormality on CT abdomen and pelvis.    PROCEDURES:  Critical Care performed: No  Procedures   MEDICATIONS ORDERED IN ED: Medications  iohexol (OMNIPAQUE) 300 MG/ML solution 100 mL (100 mLs Intravenous Contrast Given 02/20/23 1816)     IMPRESSION / MDM / ASSESSMENT AND  PLAN / ED COURSE  I reviewed the triage vital signs and the nursing notes.                              Assessment and plan: Abdominal pain: 77 year old female with past medical history detailed above, presents to the emergency department with possible blood in her colostomy bag.  Vital signs are reassuring at triage.  On exam, patient was alert and nontoxic-appearing.  Her abdomen was tender to palpation but she had no guarding.  CBC and CMP consistent with baseline labs.  Lipase within range.  Hemoccult of stool in colostomy bag was negative.  Patient had no acute abnormality on CT abdomen pelvis.  Patient's colostomy bag was changed while in the emergency department and patient felt improved with her wound care.  I did recommend GI follow-up and return precautions were given to return with new or worsening symptoms.     FINAL CLINICAL IMPRESSION(S) / ED  DIAGNOSES   Final diagnoses:  Abdominal pain, unspecified abdominal location     Rx / DC Orders   ED Discharge Orders     None        Note:  This document was prepared using Dragon voice recognition software and may include unintentional dictation errors.   Pia Mau [place], Cordelia Poche 02/20/23 1906    Minna Antis, MD 02/21/23 [phone]

## 2023-02-21 ENCOUNTER — Telehealth: Payer: Self-pay | Admitting: Nurse Practitioner

## 2023-02-21 ENCOUNTER — Other Ambulatory Visit: Payer: Self-pay

## 2023-02-21 ENCOUNTER — Other Ambulatory Visit: Payer: Self-pay | Admitting: *Deleted

## 2023-02-21 DIAGNOSIS — C8300 Small cell B-cell lymphoma, unspecified site: Secondary | ICD-10-CM

## 2023-02-21 NOTE — Telephone Encounter (Signed)
Regina from [facility] called stating she needed orders fort this patient. This the patient first time ordering medication through them. [facility]  [address] [place], [place] Zip Code: [address] Phone Number: [phone]

## 2023-02-21 NOTE — Telephone Encounter (Signed)
See other refill request for pended medications.

## 2023-02-21 NOTE — Telephone Encounter (Signed)
Prescription Request  02/21/2023  LOV: 09/10/2022  What is the name of the medication or equipment? apixaban (ELIQUIS) 2.5 MG TABS tablet, levothyroxine (SYNTHROID) 50 MCG tablet, furosemide (LASIX) 20 MG tablet, ipratropium-albuterol (DUONEB) 0.5-2.5 (3) MG/3ML SOLN, traMADol (ULTRAM) 50 MG tablet, Expired - pantoprazole (PROTONIX) 40 MG tablet, albuterol (VENTOLIN HFA) 108 (90 Base) MCG/ACT inhaler and loperamide (IMODIUM) 2 MG capsule  Have you contacted your pharmacy to request a refill? Yes   Which pharmacy would you like this sent to?   [facility]- [address], [place], [place] [address] Phone number [phone] Fax [phone]     Patient notified that their request is being sent to the clinical staff for review and that they should receive a response within 2 business days.   Please advise at [facility] [phone]

## 2023-02-21 NOTE — Telephone Encounter (Signed)
Still not sure who this patient belongs to, but Evelene Croon has been completing all paperwork despite not seeing the patient.  She is scheduled to see Evelene Croon in June.  I have pended all meds requested for your review.

## 2023-02-23 ENCOUNTER — Ambulatory Visit: Payer: Medicaid Other | Admitting: Oncology

## 2023-02-23 ENCOUNTER — Ambulatory Visit: Payer: Medicaid Other

## 2023-02-23 ENCOUNTER — Other Ambulatory Visit: Payer: Medicaid Other

## 2023-02-23 MED ORDER — PANTOPRAZOLE SODIUM 40 MG PO TBEC: 40.0000 mg | DELAYED_RELEASE_TABLET | Freq: Every day | ORAL | 5 refills | Status: DC

## 2023-02-23 MED ORDER — LEVOTHYROXINE SODIUM 50 MCG PO TABS: 50.0000 ug | ORAL_TABLET | Freq: Every day | ORAL | 5 refills | Status: DC

## 2023-02-23 MED ORDER — ALBUTEROL SULFATE HFA 108 (90 BASE) MCG/ACT IN AERS: INHALATION_SPRAY | RESPIRATORY_TRACT | 1 refills | Status: AC

## 2023-02-23 MED ORDER — IPRATROPIUM-ALBUTEROL 0.5-2.5 (3) MG/3ML IN SOLN: 3.0000 mL | Freq: Four times a day (QID) | RESPIRATORY_TRACT | 0 refills | Status: AC | PRN

## 2023-02-23 MED ORDER — FUROSEMIDE 20 MG PO TABS: 20.0000 mg | ORAL_TABLET | Freq: Every day | ORAL | 3 refills | Status: AC | PRN

## 2023-02-23 MED ORDER — LOPERAMIDE HCL 2 MG PO CAPS: 2.0000 mg | ORAL_CAPSULE | ORAL | 5 refills | Status: DC | PRN

## 2023-02-23 MED ORDER — APIXABAN 2.5 MG PO TABS: 2.5000 mg | ORAL_TABLET | Freq: Two times a day (BID) | ORAL | 5 refills | Status: AC

## 2023-02-23 MED ORDER — TRAMADOL HCL 50 MG PO TABS: 50.0000 mg | ORAL_TABLET | Freq: Four times a day (QID) | ORAL | 0 refills | Status: AC | PRN

## 2023-02-23 NOTE — Telephone Encounter (Signed)
Medication has been refilled.

## 2023-02-24 NOTE — Telephone Encounter (Signed)
Called to notify pt-number not in service

## 2023-02-24 NOTE — Telephone Encounter (Signed)
Spoke to Pharmacist at [facility] and gave the directions for the Loperamide which is to take 1 capsule ( 2mg ) by mouth as needed for diarrhea and loose stools

## 2023-02-24 NOTE — Telephone Encounter (Signed)
[facility] pharmacy called requesting clarification on directions for loperamide.  [phone]

## 2023-02-25 NOTE — Telephone Encounter (Signed)
4 mg  after the first loose BM and 2 mg after each loose BM after the first dose has been taken. No more than 8 mg in  24-hour.

## 2023-02-25 NOTE — Telephone Encounter (Signed)
[facility] pharmacy called in staying that they need to know, up to how many a day pt can take med Loperamide? They available at [phone].

## 2023-02-25 NOTE — Telephone Encounter (Signed)
Called and spoke with pharmacist.

## 2023-02-27 ENCOUNTER — Emergency Department
Admission: EM | Admit: 2023-02-27 | Discharge: 2023-02-28 | Disposition: A | Discharge status: Home / Self Care | Attending: Emergency Medicine | Admitting: Emergency Medicine

## 2023-02-27 ENCOUNTER — Other Ambulatory Visit: Payer: Self-pay

## 2023-02-27 DIAGNOSIS — R6889 Other general symptoms and signs: Secondary | ICD-10-CM | POA: Diagnosis not present

## 2023-02-27 DIAGNOSIS — I509 Heart failure, unspecified: Secondary | ICD-10-CM | POA: Diagnosis not present

## 2023-02-27 DIAGNOSIS — R1084 Generalized abdominal pain: Secondary | ICD-10-CM | POA: Diagnosis present

## 2023-02-27 DIAGNOSIS — N390 Urinary tract infection, site not specified: Secondary | ICD-10-CM

## 2023-02-27 DIAGNOSIS — L03311 Cellulitis of abdominal wall: Secondary | ICD-10-CM | POA: Diagnosis not present

## 2023-02-27 DIAGNOSIS — B379 Candidiasis, unspecified: Secondary | ICD-10-CM | POA: Insufficient documentation

## 2023-02-27 DIAGNOSIS — E119 Type 2 diabetes mellitus without complications: Secondary | ICD-10-CM | POA: Insufficient documentation

## 2023-02-27 DIAGNOSIS — J449 Chronic obstructive pulmonary disease, unspecified: Secondary | ICD-10-CM | POA: Diagnosis not present

## 2023-02-27 DIAGNOSIS — R531 Weakness: Secondary | ICD-10-CM | POA: Diagnosis not present

## 2023-02-27 DIAGNOSIS — R109 Unspecified abdominal pain: Secondary | ICD-10-CM | POA: Diagnosis not present

## 2023-02-27 DIAGNOSIS — K8020 Calculus of gallbladder without cholecystitis without obstruction: Secondary | ICD-10-CM | POA: Diagnosis not present

## 2023-02-27 DIAGNOSIS — Z743 Need for continuous supervision: Secondary | ICD-10-CM | POA: Diagnosis not present

## 2023-02-27 LAB — COMPREHENSIVE METABOLIC PANEL
ALT: 18 U/L (ref 0–44)
AST: 16 U/L (ref 15–41)
Albumin: 3.7 g/dL (ref 3.5–5.0)
Alkaline Phosphatase: 150 U/L — ABNORMAL HIGH (ref 38–126)
Anion gap: 8 (ref 5–15)
BUN: 38 mg/dL — ABNORMAL HIGH (ref 8–23)
CO2: 25 mmol/L (ref 22–32)
Calcium: 9.6 mg/dL (ref 8.9–10.3)
Chloride: 107 mmol/L (ref 98–111)
Creatinine, Ser: 0.74 mg/dL (ref 0.44–1.00)
GFR, Estimated: >60 mL/min (ref >60)
Glucose, Bld: 173 mg/dL — ABNORMAL HIGH (ref 70–99)
Potassium: 4.5 mmol/L (ref 3.5–5.1)
Sodium: 140 mmol/L (ref 135–145)
Total Bilirubin: 0.7 mg/dL (ref 0.3–1.2)
Total Protein: 6 g/dL — ABNORMAL LOW (ref 6.5–8.1)

## 2023-02-27 LAB — CBC
HCT: 36.5 % (ref 36.0–46.0)
Hemoglobin: 11.3 g/dL — ABNORMAL LOW (ref 12.0–15.0)
MCH: 25.6 pg — ABNORMAL LOW (ref 26.0–34.0)
MCHC: 31 g/dL (ref 30.0–36.0)
MCV: 82.6 fL (ref 80.0–100.0)
Platelets: 212 10*3/uL (ref 150–400)
RBC: 4.42 MIL/uL (ref 3.87–5.11)
RDW: 16.8 % — ABNORMAL HIGH (ref 11.5–15.5)
WBC: 4.4 10*3/uL (ref 4.0–10.5)
nRBC: 0 % (ref 0.0–0.2)

## 2023-02-27 LAB — LIPASE, BLOOD: Lipase: 42 U/L (ref 11–51)

## 2023-02-27 LAB — LACTIC ACID, PLASMA: Lactic Acid, Venous: 0.8 mmol/L (ref 0.5–1.9)

## 2023-02-27 NOTE — ED Triage Notes (Addendum)
Pt has ostomy on left and right lower abdomen, pt was sent by PCP for swelling and pain. RLQ is red, warm, swollen. Pt AOX4, NAD noted. Pt reports increased drainage. Borders of rash outlined with surgical marker.

## 2023-02-27 NOTE — ED Provider Notes (Signed)
[facility] Provider Note    Event Date/Time   First MD Initiated Contact with Patient 02/27/23 2307     (approximate)   History   Abdominal Pain (Ostomy complications)   HPI  Natasha [name] is a 77 y.o. 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  Denies fever or chills.  She also states some dysuria recently.  White blood cell count is normal.   External Medical Documents Reviewed: Healthcare facility notes accompany the patient documenting the redness around her ostomy site and diffuse abdominal pain also documenting discussions with Dr. Claudine Mouton who advises imaging in the emergency department for further assessment      Physical Exam   Triage Vital Signs: ED Triage Vitals  Enc Vitals Group     BP 02/27/23 1948 122/72     Pulse Rate 02/27/23 1948 77     Resp 02/27/23 1948 17     Temp 02/27/23 1948 97.8 F (36.6 C)     Temp Source 02/27/23 1948 Oral     SpO2 02/27/23 1947 99 %     Weight --      Height --      Head Circumference --      Peak Flow --      Pain Score 02/27/23 1948 8     Pain Loc --      Pain Edu? --      Excl. in GC? --     Most recent vital signs: Vitals:   02/27/23 1947 02/27/23 1948  BP:  122/72  Pulse:  77  Resp:  17  Temp:  97.8 F (36.6 C)  SpO2: 99%     General: Awake, no distress.  CV:  Good peripheral perfusion.  Resp:  Normal effort. Abd:  No distention.  Other:  Her ostomy bag has come loose on the right side, and there is surrounding erythematous changes around that site.  Diffuse tenderness to palpation in the abdomen.  She also has some inguinal crease irritation/redness consistent with what looks like a yeast infection.   ED Results / Procedures / Treatments    Labs (all labs ordered are listed, but only abnormal results are displayed) Labs Reviewed  COMPREHENSIVE METABOLIC PANEL - Abnormal; Notable for the following components:      Result Value   Glucose, Bld 173 (*)    BUN 38 (*)    Total Protein 6.0 (*)    Alkaline Phosphatase 150 (*)    All other components within normal limits  CBC - Abnormal; Notable for the following components:   Hemoglobin 11.3 (*)    MCH 25.6 (*)    RDW 16.8 (*)    All other components within normal limits  CULTURE, BLOOD (ROUTINE X 2)  CULTURE, BLOOD (ROUTINE X 2)  LIPASE, BLOOD  LACTIC ACID, PLASMA  LACTIC ACID, PLASMA  URINALYSIS, W/ REFLEX TO CULTURE (INFECTION SUSPECTED)     I ordered and reviewed the above labs they are notable for white blood cell count is normal and H&H at baseline  RADIOLOGY I independently reviewed and interpreted CT of the abdomen pelvis is no obvious fluid collections   PROCEDURES:  Critical Care performed: No  Procedures   MEDICATIONS ORDERED IN ED: Medications  cephALEXin (KEFLEX) capsule 500 mg (500  mg Oral Given 02/28/23 0108)  sulfamethoxazole-trimethoprim (BACTRIM DS) 800-160 MG per tablet 1 tablet (1 tablet Oral Given 02/28/23 0108)  nystatin (MYCOSTATIN/NYSTOP) topical powder ( Topical Given 02/28/23 0109)  iohexol (OMNIPAQUE) 300 MG/ML solution 100 mL (100 mLs Intravenous Contrast Given 02/28/23 0030)    IMPRESSION / MDM / ASSESSMENT AND PLAN / ED COURSE  I reviewed the triage vital signs and the nursing notes.                                Patient's presentation is most consistent with acute presentation with potential threat to life or bodily function.  Differential diagnosis includes, but is not limited to, cellulitis, abscess, intra-abdominal infection, obstruction, urinary tract infection, yeast infection, sepsis   The patient is on the cardiac monitor to evaluate for evidence of arrhythmia and/or significant heart rate changes.  MDM: This is a  patient with multiple medical comorbidities who presents emergency department with cellulitic changes around her ostomy site and diffuse abdominal pain.  Also dysuria that she has been using her Lasix for several days.  Otherwise appears nontoxic with normal vital signs and no fever.  Get a CT of the abdomen pelvis to assess for intra-abdominal infection or abscess.  I will cover her with Keflex and Bactrim for cellulitis as well as nystatin powder for her inguinal yeast infection and obtain a urinalysis to assess for urinary tract infection.  --- Patient remained stable lactic and white blood cell count normal afebrile normal vital signs.  Comfortable.  Cellulitic changes on exam but CT scan shows no fluid collections or other intra-abdominal emergencies.  I will cover her with Keflex and Bactrim.  Her urinalysis is pending, I will await these results prior to discharge however she will be on Keflex anyways which should cover for urinary tract infection.  She understands to return with any new or worsening symptoms.        FINAL CLINICAL IMPRESSION(S) / ED DIAGNOSES   Final diagnoses:  Cellulitis of abdominal wall  Yeast infection     Rx / DC Orders   ED Discharge Orders          Ordered    nystatin (MYCOSTATIN/NYSTOP) powder  3 times daily        02/28/23 0014    cephALEXin (KEFLEX) 500 MG capsule  4 times daily        02/28/23 0014    sulfamethoxazole-trimethoprim (BACTRIM DS) 800-160 MG tablet  2 times daily        02/28/23 0014             Note:  This document was prepared using Dragon voice recognition software and may include unintentional dictation errors.    Pilar Jarvis, MD 02/28/23 Natasha [name]

## 2023-02-27 NOTE — ED Triage Notes (Signed)
FIRST NURSE NOTE:  Pt arrived via [facility] from home, with c/o R and L stoma, not adhesing to skin well x 5 days, leaking stool  VSS with EMS.

## 2023-02-28 ENCOUNTER — Emergency Department

## 2023-02-28 DIAGNOSIS — K8020 Calculus of gallbladder without cholecystitis without obstruction: Secondary | ICD-10-CM | POA: Diagnosis not present

## 2023-02-28 DIAGNOSIS — R109 Unspecified abdominal pain: Secondary | ICD-10-CM | POA: Diagnosis not present

## 2023-02-28 LAB — URINALYSIS, W/ REFLEX TO CULTURE (INFECTION SUSPECTED)
Bilirubin Urine: NEGATIVE
Glucose, UA: NEGATIVE mg/dL
Hgb urine dipstick: NEGATIVE
Ketones, ur: NEGATIVE mg/dL
Nitrite: NEGATIVE
Protein, ur: NEGATIVE mg/dL
Specific Gravity, Urine: 1.029 (ref 1.005–1.030)
WBC, UA: >50 WBC/hpf (ref 0–5)
pH: 5 (ref 5.0–8.0)

## 2023-02-28 LAB — CULTURE, BLOOD (ROUTINE X 2): Culture: NO GROWTH

## 2023-02-28 LAB — LACTIC ACID, PLASMA: Lactic Acid, Venous: 0.6 mmol/L (ref 0.5–1.9)

## 2023-02-28 MED ORDER — NYSTATIN 100000 UNIT/GM EX POWD
Freq: Once | CUTANEOUS | Status: AC
  Filled 2023-02-28: qty 15

## 2023-02-28 MED ORDER — IOHEXOL 300 MG/ML  SOLN
100.0000 mL | Freq: Once | INTRAMUSCULAR | Status: AC | PRN
  Administered 2023-02-28: 100 mL via INTRAVENOUS

## 2023-02-28 MED ORDER — CEPHALEXIN 500 MG PO CAPS
500.0000 mg | ORAL_CAPSULE | Freq: Once | ORAL | Status: AC
  Administered 2023-02-28: 500 mg via ORAL
  Filled 2023-02-28: qty 1

## 2023-02-28 MED ORDER — SULFAMETHOXAZOLE-TRIMETHOPRIM 800-160 MG PO TABS
1.0000 | ORAL_TABLET | Freq: Once | ORAL | Status: AC
  Administered 2023-02-28: 1 via ORAL
  Filled 2023-02-28: qty 1

## 2023-02-28 MED ORDER — NYSTATIN 100000 UNIT/GM EX POWD: 1.0000 | Freq: Three times a day (TID) | CUTANEOUS | 0 refills | Status: AC

## 2023-02-28 MED ORDER — HEPARIN SOD (PORK) LOCK FLUSH 100 UNIT/ML IV SOLN
500.0000 [IU] | Freq: Once | INTRAVENOUS | Status: AC
  Administered 2023-02-28: 500 [IU] via INTRAVENOUS
  Filled 2023-02-28 (×2): qty 5

## 2023-02-28 MED ORDER — SULFAMETHOXAZOLE-TRIMETHOPRIM 800-160 MG PO TABS: 1.0000 | ORAL_TABLET | Freq: Two times a day (BID) | ORAL | 0 refills | Status: DC

## 2023-02-28 MED ORDER — CEPHALEXIN 500 MG PO CAPS: 500.0000 mg | ORAL_CAPSULE | Freq: Four times a day (QID) | ORAL | 0 refills | Status: DC

## 2023-02-28 NOTE — Discharge Instructions (Signed)
Take antibiotics for the full course as prescribed.  See your doctor for a follow-up visit this week.  If you have any new, worsening, unexpected symptoms come back to the emergency department for recheck.

## 2023-03-01 LAB — URINE CULTURE: Culture: >=100000 — AB

## 2023-03-01 LAB — CULTURE, BLOOD (ROUTINE X 2)

## 2023-03-02 LAB — URINE CULTURE

## 2023-03-02 LAB — CULTURE, BLOOD (ROUTINE X 2): Culture: NO GROWTH

## 2023-03-03 ENCOUNTER — Inpatient Hospital Stay
Admission: EM | Admit: 2023-03-03 | Discharge: 2023-03-06 | DRG: 394 | Disposition: A | Discharge status: Hospice | Attending: Internal Medicine | Admitting: Internal Medicine

## 2023-03-03 DIAGNOSIS — Z794 Long term (current) use of insulin: Secondary | ICD-10-CM | POA: Diagnosis not present

## 2023-03-03 DIAGNOSIS — C8300 Small cell B-cell lymphoma, unspecified site: Secondary | ICD-10-CM | POA: Diagnosis present

## 2023-03-03 DIAGNOSIS — Z9049 Acquired absence of other specified parts of digestive tract: Secondary | ICD-10-CM | POA: Diagnosis not present

## 2023-03-03 DIAGNOSIS — R21 Rash and other nonspecific skin eruption: Secondary | ICD-10-CM | POA: Diagnosis not present

## 2023-03-03 DIAGNOSIS — Z8249 Family history of ischemic heart disease and other diseases of the circulatory system: Secondary | ICD-10-CM | POA: Diagnosis not present

## 2023-03-03 DIAGNOSIS — E039 Hypothyroidism, unspecified: Secondary | ICD-10-CM | POA: Diagnosis present

## 2023-03-03 DIAGNOSIS — J9611 Chronic respiratory failure with hypoxia: Secondary | ICD-10-CM | POA: Diagnosis present

## 2023-03-03 DIAGNOSIS — Z8719 Personal history of other diseases of the digestive system: Secondary | ICD-10-CM | POA: Diagnosis not present

## 2023-03-03 DIAGNOSIS — R6889 Other general symptoms and signs: Secondary | ICD-10-CM | POA: Diagnosis not present

## 2023-03-03 DIAGNOSIS — Z79899 Other long term (current) drug therapy: Secondary | ICD-10-CM | POA: Diagnosis not present

## 2023-03-03 DIAGNOSIS — L309 Dermatitis, unspecified: Secondary | ICD-10-CM

## 2023-03-03 DIAGNOSIS — E119 Type 2 diabetes mellitus without complications: Secondary | ICD-10-CM | POA: Diagnosis not present

## 2023-03-03 DIAGNOSIS — Z79890 Hormone replacement therapy (postmenopausal): Secondary | ICD-10-CM

## 2023-03-03 DIAGNOSIS — Z9981 Dependence on supplemental oxygen: Secondary | ICD-10-CM | POA: Diagnosis not present

## 2023-03-03 DIAGNOSIS — I959 Hypotension, unspecified: Secondary | ICD-10-CM | POA: Diagnosis present

## 2023-03-03 DIAGNOSIS — Z66 Do not resuscitate: Secondary | ICD-10-CM | POA: Diagnosis not present

## 2023-03-03 DIAGNOSIS — Z515 Encounter for palliative care: Secondary | ICD-10-CM

## 2023-03-03 DIAGNOSIS — J449 Chronic obstructive pulmonary disease, unspecified: Secondary | ICD-10-CM | POA: Diagnosis not present

## 2023-03-03 DIAGNOSIS — K9402 Colostomy infection: Principal | ICD-10-CM | POA: Diagnosis present

## 2023-03-03 DIAGNOSIS — Z801 Family history of malignant neoplasm of trachea, bronchus and lung: Secondary | ICD-10-CM

## 2023-03-03 DIAGNOSIS — L03311 Cellulitis of abdominal wall: Secondary | ICD-10-CM | POA: Diagnosis not present

## 2023-03-03 DIAGNOSIS — L24B3 Irritant contact dermatitis related to fecal or urinary stoma or fistula: Secondary | ICD-10-CM | POA: Diagnosis not present

## 2023-03-03 DIAGNOSIS — Z1152 Encounter for screening for COVID-19: Secondary | ICD-10-CM

## 2023-03-03 DIAGNOSIS — Z803 Family history of malignant neoplasm of breast: Secondary | ICD-10-CM

## 2023-03-03 DIAGNOSIS — Z87891 Personal history of nicotine dependence: Secondary | ICD-10-CM | POA: Diagnosis not present

## 2023-03-03 DIAGNOSIS — C8510 Unspecified B-cell lymphoma, unspecified site: Secondary | ICD-10-CM | POA: Diagnosis present

## 2023-03-03 DIAGNOSIS — R0689 Other abnormalities of breathing: Secondary | ICD-10-CM | POA: Diagnosis not present

## 2023-03-03 DIAGNOSIS — Z7901 Long term (current) use of anticoagulants: Secondary | ICD-10-CM

## 2023-03-03 DIAGNOSIS — I5032 Chronic diastolic (congestive) heart failure: Secondary | ICD-10-CM | POA: Diagnosis not present

## 2023-03-03 DIAGNOSIS — Z820 Family history of epilepsy and other diseases of the nervous system: Secondary | ICD-10-CM

## 2023-03-03 DIAGNOSIS — R109 Unspecified abdominal pain: Secondary | ICD-10-CM | POA: Diagnosis not present

## 2023-03-03 DIAGNOSIS — Z932 Ileostomy status: Secondary | ICD-10-CM

## 2023-03-03 DIAGNOSIS — Z743 Need for continuous supervision: Secondary | ICD-10-CM | POA: Diagnosis not present

## 2023-03-03 NOTE — ED Triage Notes (Signed)
Pt from home (on hospice care via home health) comes due to colostomy problems, pt ostomy site and abdomen is red and excoriated per hospice nurse, pt states she was on ABX on is unsure if she finished them. States she ran out of supplies as well.

## 2023-03-04 DIAGNOSIS — L24B3 Irritant contact dermatitis related to fecal or urinary stoma or fistula: Secondary | ICD-10-CM | POA: Diagnosis present

## 2023-03-04 DIAGNOSIS — Z932 Ileostomy status: Secondary | ICD-10-CM

## 2023-03-04 DIAGNOSIS — Z87891 Personal history of nicotine dependence: Secondary | ICD-10-CM | POA: Diagnosis not present

## 2023-03-04 DIAGNOSIS — Z79899 Other long term (current) drug therapy: Secondary | ICD-10-CM | POA: Diagnosis not present

## 2023-03-04 DIAGNOSIS — Z515 Encounter for palliative care: Secondary | ICD-10-CM | POA: Diagnosis not present

## 2023-03-04 DIAGNOSIS — Z1152 Encounter for screening for COVID-19: Secondary | ICD-10-CM | POA: Diagnosis not present

## 2023-03-04 DIAGNOSIS — J449 Chronic obstructive pulmonary disease, unspecified: Secondary | ICD-10-CM | POA: Diagnosis present

## 2023-03-04 DIAGNOSIS — I5032 Chronic diastolic (congestive) heart failure: Secondary | ICD-10-CM | POA: Diagnosis present

## 2023-03-04 DIAGNOSIS — Z66 Do not resuscitate: Secondary | ICD-10-CM | POA: Diagnosis present

## 2023-03-04 DIAGNOSIS — Z7901 Long term (current) use of anticoagulants: Secondary | ICD-10-CM

## 2023-03-04 DIAGNOSIS — Z8719 Personal history of other diseases of the digestive system: Secondary | ICD-10-CM | POA: Diagnosis not present

## 2023-03-04 DIAGNOSIS — Z803 Family history of malignant neoplasm of breast: Secondary | ICD-10-CM | POA: Diagnosis not present

## 2023-03-04 DIAGNOSIS — Z9049 Acquired absence of other specified parts of digestive tract: Secondary | ICD-10-CM | POA: Diagnosis not present

## 2023-03-04 DIAGNOSIS — Z9981 Dependence on supplemental oxygen: Secondary | ICD-10-CM | POA: Diagnosis not present

## 2023-03-04 DIAGNOSIS — Z794 Long term (current) use of insulin: Secondary | ICD-10-CM | POA: Diagnosis not present

## 2023-03-04 DIAGNOSIS — Z801 Family history of malignant neoplasm of trachea, bronchus and lung: Secondary | ICD-10-CM | POA: Diagnosis not present

## 2023-03-04 DIAGNOSIS — Z79890 Hormone replacement therapy (postmenopausal): Secondary | ICD-10-CM | POA: Diagnosis not present

## 2023-03-04 DIAGNOSIS — L309 Dermatitis, unspecified: Secondary | ICD-10-CM

## 2023-03-04 DIAGNOSIS — K9402 Colostomy infection: Secondary | ICD-10-CM | POA: Diagnosis present

## 2023-03-04 DIAGNOSIS — L03311 Cellulitis of abdominal wall: Secondary | ICD-10-CM | POA: Diagnosis present

## 2023-03-04 DIAGNOSIS — E119 Type 2 diabetes mellitus without complications: Secondary | ICD-10-CM | POA: Diagnosis present

## 2023-03-04 DIAGNOSIS — Z8249 Family history of ischemic heart disease and other diseases of the circulatory system: Secondary | ICD-10-CM | POA: Diagnosis not present

## 2023-03-04 DIAGNOSIS — J9611 Chronic respiratory failure with hypoxia: Secondary | ICD-10-CM | POA: Diagnosis present

## 2023-03-04 DIAGNOSIS — C8510 Unspecified B-cell lymphoma, unspecified site: Secondary | ICD-10-CM | POA: Diagnosis present

## 2023-03-04 DIAGNOSIS — Z820 Family history of epilepsy and other diseases of the nervous system: Secondary | ICD-10-CM | POA: Diagnosis not present

## 2023-03-04 DIAGNOSIS — I959 Hypotension, unspecified: Secondary | ICD-10-CM | POA: Diagnosis present

## 2023-03-04 LAB — CBC
HCT: 32.9 % — ABNORMAL LOW (ref 36.0–46.0)
Hemoglobin: 10.3 g/dL — ABNORMAL LOW (ref 12.0–15.0)
MCH: 25.9 pg — ABNORMAL LOW (ref 26.0–34.0)
MCHC: 31.3 g/dL (ref 30.0–36.0)
MCV: 82.9 fL (ref 80.0–100.0)
Platelets: 195 10*3/uL (ref 150–400)
RBC: 3.97 MIL/uL (ref 3.87–5.11)
RDW: 16.6 % — ABNORMAL HIGH (ref 11.5–15.5)
WBC: 4.8 10*3/uL (ref 4.0–10.5)
nRBC: 0 % (ref 0.0–0.2)

## 2023-03-04 LAB — URINALYSIS, W/ REFLEX TO CULTURE (INFECTION SUSPECTED)
Bilirubin Urine: NEGATIVE
Glucose, UA: NEGATIVE mg/dL
Hgb urine dipstick: NEGATIVE
Ketones, ur: NEGATIVE mg/dL
Nitrite: NEGATIVE
Protein, ur: NEGATIVE mg/dL
Specific Gravity, Urine: 1.017 (ref 1.005–1.030)
pH: 5 (ref 5.0–8.0)

## 2023-03-04 LAB — COMPREHENSIVE METABOLIC PANEL
ALT: 13 U/L (ref 0–44)
AST: 11 U/L — ABNORMAL LOW (ref 15–41)
Albumin: 3.3 g/dL — ABNORMAL LOW (ref 3.5–5.0)
Alkaline Phosphatase: 121 U/L (ref 38–126)
Anion gap: 7 (ref 5–15)
BUN: 37 mg/dL — ABNORMAL HIGH (ref 8–23)
CO2: 23 mmol/L (ref 22–32)
Calcium: 8.6 mg/dL — ABNORMAL LOW (ref 8.9–10.3)
Chloride: 106 mmol/L (ref 98–111)
Creatinine, Ser: 0.85 mg/dL (ref 0.44–1.00)
GFR, Estimated: >60 mL/min (ref >60)
Glucose, Bld: 175 mg/dL — ABNORMAL HIGH (ref 70–99)
Potassium: 3.9 mmol/L (ref 3.5–5.1)
Sodium: 136 mmol/L (ref 135–145)
Total Bilirubin: 0.6 mg/dL (ref 0.3–1.2)
Total Protein: 5.4 g/dL — ABNORMAL LOW (ref 6.5–8.1)

## 2023-03-04 LAB — PROCALCITONIN: Procalcitonin: <0.1 ng/mL

## 2023-03-04 LAB — RESP PANEL BY RT-PCR (RSV, FLU A&B, COVID)  RVPGX2
Influenza A by PCR: NEGATIVE
Influenza B by PCR: NEGATIVE
Resp Syncytial Virus by PCR: NEGATIVE
SARS Coronavirus 2 by RT PCR: NEGATIVE

## 2023-03-04 LAB — LACTIC ACID, PLASMA
Lactic Acid, Venous: 1 mmol/L (ref 0.5–1.9)
Lactic Acid, Venous: 1 mmol/L (ref 0.5–1.9)

## 2023-03-04 LAB — LIPASE, BLOOD: Lipase: 30 U/L (ref 11–51)

## 2023-03-04 LAB — GLUCOSE, CAPILLARY
Glucose-Capillary: 173 mg/dL — ABNORMAL HIGH (ref 70–99)
Glucose-Capillary: 191 mg/dL — ABNORMAL HIGH (ref 70–99)
Glucose-Capillary: 122 mg/dL — ABNORMAL HIGH (ref 70–99)
Glucose-Capillary: 153 mg/dL — ABNORMAL HIGH (ref 70–99)

## 2023-03-04 LAB — CULTURE, BLOOD (ROUTINE X 2)

## 2023-03-04 MED ORDER — MORPHINE SULFATE (PF) 2 MG/ML IV SOLN: 2.0000 mg | INTRAVENOUS | Status: DC | PRN

## 2023-03-04 MED ORDER — INSULIN ASPART 100 UNIT/ML IJ SOLN
0.0000 [IU] | Freq: Three times a day (TID) | INTRAMUSCULAR | Status: DC
  Administered 2023-03-04 (×2): 3 [IU] via SUBCUTANEOUS
  Administered 2023-03-04: 2 [IU] via SUBCUTANEOUS
  Administered 2023-03-05 (×2): 3 [IU] via SUBCUTANEOUS
  Administered 2023-03-06: 2 [IU] via SUBCUTANEOUS
  Administered 2023-03-06: 3 [IU] via SUBCUTANEOUS
  Filled 2023-03-04 (×7): qty 1

## 2023-03-04 MED ORDER — PANTOPRAZOLE SODIUM 40 MG PO TBEC
40.0000 mg | DELAYED_RELEASE_TABLET | Freq: Every day | ORAL | Status: DC
  Administered 2023-03-04 – 2023-03-06 (×3): 40 mg via ORAL
  Filled 2023-03-04 (×3): qty 1

## 2023-03-04 MED ORDER — LEVOTHYROXINE SODIUM 50 MCG PO TABS
50.0000 ug | ORAL_TABLET | Freq: Every day | ORAL | Status: DC
  Administered 2023-03-04 – 2023-03-06 (×3): 50 ug via ORAL
  Filled 2023-03-04 (×3): qty 1

## 2023-03-04 MED ORDER — VANCOMYCIN HCL IN DEXTROSE 1-5 GM/200ML-% IV SOLN: 1000.0000 mg | Freq: Once | INTRAVENOUS | Status: DC

## 2023-03-04 MED ORDER — SODIUM CHLORIDE 0.9 % IV SOLN
2.0000 g | Freq: Once | INTRAVENOUS | Status: AC
  Administered 2023-03-04: 2 g via INTRAVENOUS
  Filled 2023-03-04: qty 12.5

## 2023-03-04 MED ORDER — APIXABAN 2.5 MG PO TABS
2.5000 mg | ORAL_TABLET | Freq: Two times a day (BID) | ORAL | Status: DC
  Administered 2023-03-04 – 2023-03-06 (×5): 2.5 mg via ORAL
  Filled 2023-03-04 (×5): qty 1

## 2023-03-04 MED ORDER — LACTATED RINGERS IV BOLUS (SEPSIS)
1000.0000 mL | Freq: Once | INTRAVENOUS | Status: AC
  Administered 2023-03-04: 1000 mL via INTRAVENOUS

## 2023-03-04 MED ORDER — ONDANSETRON HCL 4 MG PO TABS: 4.0000 mg | ORAL_TABLET | Freq: Four times a day (QID) | ORAL | Status: DC | PRN

## 2023-03-04 MED ORDER — FENTANYL CITRATE PF 50 MCG/ML IJ SOSY
50.0000 ug | PREFILLED_SYRINGE | Freq: Once | INTRAMUSCULAR | Status: AC
  Administered 2023-03-04: 50 ug via INTRAVENOUS
  Filled 2023-03-04: qty 1

## 2023-03-04 MED ORDER — ACETAMINOPHEN 650 MG RE SUPP: 650.0000 mg | Freq: Four times a day (QID) | RECTAL | Status: DC | PRN

## 2023-03-04 MED ORDER — OXYCODONE HCL 5 MG PO TABS
5.0000 mg | ORAL_TABLET | ORAL | Status: DC | PRN
  Administered 2023-03-04 – 2023-03-06 (×6): 5 mg via ORAL
  Filled 2023-03-04 (×6): qty 1

## 2023-03-04 MED ORDER — LACTATED RINGERS IV SOLN: INTRAVENOUS | Status: AC

## 2023-03-04 MED ORDER — INSULIN ASPART 100 UNIT/ML IJ SOLN: 0.0000 [IU] | Freq: Every day | INTRAMUSCULAR | Status: DC

## 2023-03-04 MED ORDER — VANCOMYCIN HCL 1250 MG/250ML IV SOLN
1250.0000 mg | Freq: Once | INTRAVENOUS | Status: AC
  Administered 2023-03-04: 1250 mg via INTRAVENOUS
  Filled 2023-03-04: qty 250

## 2023-03-04 MED ORDER — SODIUM CHLORIDE 0.9 % IV SOLN
1.0000 g | INTRAVENOUS | Status: DC
  Administered 2023-03-04 – 2023-03-06 (×3): 1 g via INTRAVENOUS
  Filled 2023-03-04: qty 1
  Filled 2023-03-04 (×2): qty 10

## 2023-03-04 MED ORDER — ALBUTEROL SULFATE (2.5 MG/3ML) 0.083% IN NEBU: 2.5000 mg | INHALATION_SOLUTION | RESPIRATORY_TRACT | Status: DC | PRN

## 2023-03-04 MED ORDER — ONDANSETRON HCL 4 MG/2ML IJ SOLN: 4.0000 mg | Freq: Four times a day (QID) | INTRAMUSCULAR | Status: DC | PRN

## 2023-03-04 MED ORDER — ACETAMINOPHEN 325 MG PO TABS: 650.0000 mg | ORAL_TABLET | Freq: Four times a day (QID) | ORAL | Status: DC | PRN

## 2023-03-04 MED ORDER — DIPHENHYDRAMINE HCL 25 MG PO CAPS
25.0000 mg | ORAL_CAPSULE | Freq: Four times a day (QID) | ORAL | Status: DC | PRN
  Administered 2023-03-05: 25 mg via ORAL
  Filled 2023-03-04: qty 1

## 2023-03-04 NOTE — ED Notes (Signed)
Pt colostomy cleaned and new dressing and bag placed on pt. Pt had stool all over her, this RN helped pt clean up stool on legs and feet. Pt comfortable, warm blanket applied.

## 2023-03-04 NOTE — Progress Notes (Signed)
CODE SEPSIS - PHARMACY COMMUNICATION  **Broad Spectrum Antibiotics should be administered within 1 hour of Sepsis diagnosis**  Time Code Sepsis Called/Page Received: 5/10 @ 0014   Antibiotics Ordered: Cefepime , Vancomycin   Time of 1st antibiotic administration: Cefepime 2 gm IV X 1 on 5/10 @ 0100   Additional action taken by pharmacy:   If necessary, Name of Provider/Nurse Contacted:     Pati Thinnes D ,PharmD Clinical Pharmacist  03/04/2023  1:23 AM

## 2023-03-04 NOTE — Progress Notes (Signed)
[facility]) hospitalized hospice patient visit  Natasha [name] is a current [facility] patient with a terminal diagnosis of unspecified B-cell lymphoma. She has been ongoing issues with infection and pain around ostomy site. She has been leaving ostomy uncovered in the home and [facility] team has been working on solutions including different medications and ostomy care. She presented to ED on the evening of 5.9 and was admitted on 5.10 with a diagnosis of abdominal wall cellulitis. Per Dr. Patric Dykes with [facility] this is a related hospital admission.   Visited with patient at bedside, she is playing on her phone and reports to feeling some better. Nursing staff has replaced her ostomy bag and she reports that it is feeling much better. She reports that she plans to return home once her treatment is complete.   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  Not meeting sepsis criteria at this time Patient previously on midodrine Received a 2 L fluid bolus in the ED   Ileostomy status (HCC) Ileostomy care   Nodal marginal zone B-cell lymphoma  (HCC) No acute issues suspected   Chronic obstructive pulmonary disease (COPD) (HCC) Chronic respiratory failure with hypoxia COPD not in acute exacerbation Continue DuoNebs as needed Continue supplemental oxygen   History of GI bleed History of hemorrhagic shock January 2024 No evidence of bleeding.  Patient on chronic anticoagulation Monitor   Discharge Planning- Ongoing Family Contact- Patient alert and oriented and able to speak for herself IDT- Updated Goals of care - Ongoing, patient is DNR but wants treatment for acute episode. Thea Gist, Charity fundraiser, [facility] Liaison [phone]

## 2023-03-04 NOTE — Progress Notes (Signed)
                                                     Palliative Care Progress Note   Patient Name: Natasha [name]       Date: 03/04/2023 DOB: [birth year]  Age: 77 y.o. MRN#: [id number] Attending Physician: Loyce Dys, MD Primary Care Physician: Kara Dies, NP Admit Date: 03/03/2023  Chart reviewed.  Patient is active with a [facility] hosp[facility] services at home.  Message sent to hospice liaison Misty, who confirmed that [facility] is leading goals of care discussions as well as symptom management at this time.  PMT will remain available to patient/family/medical team.  PMT shadow the chart and monitor peripherally. Please re-engage with PMT for any acute palliative needs not addressed by [facility].  Thank you for allowing the Palliative Medicine Team to assist in the care of [facility].  Samara Deist L. Manon Hilding, FNP-BC Palliative Medicine Team Team Phone # [phone]  No charge

## 2023-03-04 NOTE — Assessment & Plan Note (Signed)
Sliding scale insulin coverage On NPH regular 70/30 at baseline

## 2023-03-04 NOTE — Assessment & Plan Note (Signed)
Chronic respiratory failure with hypoxia COPD not acutely flared Continue DuoNebs as needed Continue supplemental oxygen

## 2023-03-04 NOTE — Assessment & Plan Note (Addendum)
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

## 2023-03-04 NOTE — Assessment & Plan Note (Signed)
Ileostomy care   

## 2023-03-04 NOTE — ED Provider Notes (Addendum)
[facility] Provider Note    Event Date/Time   First MD Initiated Contact with Patient 03/03/23 2318     (approximate)   History   colostomy problem    HPI  Natasha [name] is a 77 y.o. female past medical history significant for COPD, CHF, diabetes, lymphoma, colectomy and end ileostomy for volvulus, currently followed by hospice care, who presents to the emergency department for abdominal pain.  Patient states that she has had abdominal redness and pain to the right side of her abdomen.  Recently evaluated in the emergency department and started on 2 different antibiotics.  States that the redness has worsened since that time.  Denies any fever or chills.  Denies any nausea or vomiting.  No diarrhea that is new.     Physical Exam   Triage Vital Signs: ED Triage Vitals  Enc Vitals Group     BP 03/03/23 2306 (!) 95/44     Pulse Rate 03/03/23 2306 81     Resp 03/03/23 2306 18     Temp 03/03/23 2306 98.1 F (36.7 C)     Temp Source 03/03/23 2306 Oral     SpO2 03/03/23 2306 96 %     Weight 03/03/23 2307 130 lb (59 kg)     Height 03/03/23 2307 5\' 5"  (1.651 m)     Head Circumference --      Peak Flow --      Pain Score 03/03/23 2307 0     Pain Loc --      Pain Edu? --      Excl. in GC? --     Most recent vital signs: Vitals:   03/03/23 2306  BP: (!) 95/44  Pulse: 81  Resp: 18  Temp: 98.1 F (36.7 C)  SpO2: 96%    Physical Exam Constitutional:      Appearance: She is well-developed.  HENT:     Head: Atraumatic.  Eyes:     Conjunctiva/sclera: Conjunctivae normal.  Cardiovascular:     Rate and Rhythm: Regular rhythm.  Pulmonary:     Effort: No respiratory distress.  Abdominal:     General: There is no distension.     Tenderness: There is abdominal tenderness.     Comments: Erythema and warmth to the right abdominal wall.  Induration.  No crepitance or purulent drainage.  Musculoskeletal:        General: Normal range of motion.      Cervical back: Normal range of motion.  Skin:    General: Skin is warm.  Neurological:     Mental Status: She is alert. Mental status is at baseline.         IMPRESSION / MDM / ASSESSMENT AND PLAN / ED COURSE  I reviewed the triage vital signs and the nursing notes.  Differential diagnosis including intra-abdominal abscess, cellulitis, fungal infection.  No crepitus, have a low suspicion for necrotizing soft tissue infection.  On chart review patient had a recent CT scan of her abdomen and pelvis that did not show any signs of an intra-abdominal abscess.  Patient was started on Keflex and Bactrim.  After discussion with the patient she is followed by hospice care but states that she would want admitted for IV antibiotics   Labs (all labs ordered are listed, but only abnormal results are displayed) Labs interpreted as -    Labs Reviewed  CBC - Abnormal; Notable for the following components:      Result Value  Hemoglobin 10.3 (*)    HCT 32.9 (*)    MCH 25.9 (*)    RDW 16.6 (*)    All other components within normal limits  COMPREHENSIVE METABOLIC PANEL - Abnormal; Notable for the following components:   Glucose, Bld 175 (*)    BUN 37 (*)    Calcium 8.6 (*)    Total Protein 5.4 (*)    Albumin 3.3 (*)    AST 11 (*)    All other components within normal limits  RESP PANEL BY RT-PCR (RSV, FLU A&B, COVID)  RVPGX2  CULTURE, BLOOD (ROUTINE X 2)  CULTURE, BLOOD (ROUTINE X 2)  LIPASE, BLOOD  LACTIC ACID, PLASMA  LACTIC ACID, PLASMA  URINALYSIS, W/ REFLEX TO CULTURE (INFECTION SUSPECTED)      Given concern for failed outpatient management of antibiotics for cellulitis blood cultures and lactic acid obtained.  Started the patient on vancomycin and cefepime for complicated cellulitis.  No significant abdominal tenderness to palpation we will hold on repeat CT imaging at this time.  No leukocytosis and normal lactic acid.  No significant electrolyte abnormalities.  Consulted  hospitalist for admission for acute cellulitis that has failed outpatient antibiotics.   PROCEDURES:  Critical Care performed: No  Procedures  Patient's presentation is most consistent with acute presentation with potential threat to life or bodily function.   MEDICATIONS ORDERED IN ED: Medications  lactated ringers infusion (has no administration in time range)  lactated ringers bolus 1,000 mL (1,000 mLs Intravenous New Bag/Given 03/04/23 0054)  ceFEPIme (MAXIPIME) 2 g in sodium chloride 0.9 % 100 mL IVPB (2 g Intravenous New Bag/Given 03/04/23 0100)  vancomycin (VANCOREADY) IVPB 1250 mg/250 mL (has no administration in time range)  fentaNYL (SUBLIMAZE) injection 50 mcg (50 mcg Intravenous Given 03/04/23 0054)    FINAL CLINICAL IMPRESSION(S) / ED DIAGNOSES   Final diagnoses:  Cellulitis of abdominal wall     Rx / DC Orders   ED Discharge Orders     None        Note:  This document was prepared using Dragon voice recognition software and may include unintentional dictation errors.   Corena Herter, MD 03/04/23 8657    Corena Herter, MD 03/04/23 0126

## 2023-03-04 NOTE — Assessment & Plan Note (Addendum)
Suspect chronic.  Not meeting sepsis criteria at this time Patient previously on midodrine Received a 2 L fluid bolus in the ED

## 2023-03-04 NOTE — H&P (Addendum)
History and Physical    Patient: Natasha [name] DOB: 13-Apr-1946 DOA: 03/03/2023 DOS: the patient was seen and examined on 03/04/2023 PCP: Kara Dies, NP  Patient coming from: Home  Chief Complaint:  Chief Complaint  Patient presents with   colostomy problem     HPI: Natasha [name] is a 77 y.o. 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  Patient was seen in the ED on 02/27/2023 with redness and leaking around the ostomy site as well as diffuse abdominal pain.  Workup was unremarkable with normal WBC and lactic acid and CT abdomen and pelvis showed no obvious fluid collections.  She was discharged on Keflex and Bactrim as well as nystatin powder.  Blood cultures were negative.  Urine culture did grow Klebsiella that was sensitive to Bactrim.  Due to worsening redness, she was sent back to the ED for evaluation.  She has had no fever or chills or nausea or vomiting.  She has no abdominal pain . ED course and data review: BP was soft on arrival at 95/44, fluid responsive to 111/59 with otherwise normal vitals.  WBC normal at 4800 with normal lactic acid of 1.0.  Hemoglobin at baseline at 10.3.  Labs otherwise unremarkable.  Urinalysis pending. Patient was started on cefepime and vancomycin and given a 2 L LR bolus for initial suspicion of sepsis.  She was also treated with fentanyl for pain at the site of the redness. Hospitalist consulted for admission.   Review of Systems: As mentioned in the history of present illness. All other systems reviewed and are negative.  Past Medical History:  Diagnosis Date    Anemia    Cancer (HCC)    lymphoma-stomach    Cancer (HCC)    leulemia   CHF (congestive heart failure) (HCC)    COPD (chronic obstructive pulmonary disease) (HCC)    Diabetes mellitus without complication (HCC)    Hypotension    Pleural effusion    [facility] ,  2 weeks ago   Vaginal delivery    x 5   Past Surgical History:  Procedure Laterality Date   INCISION AND DRAINAGE OF WOUND Left 01/07/2023   Procedure: ARTHROSCOPIC LEFT KNEE WASHOUT;  Surgeon: Christena Flake, MD;  Location: [facility] ORS;  Service: Orthopedics;  Laterality: Left;   IR IMAGING GUIDED PORT INSERTION  08/16/2022   LAPAROTOMY N/A 01/09/2023   Procedure: EXPLORATORY LAPAROTOMY AND RIGHT COLECTOMY;  Surgeon: Leafy Ro, MD;  Location: [facility] ORS;  Service: General;  Laterality: N/A;   TONSILLECTOMY Bilateral    as a child   Social History:  reports that she quit smoking about 7 months ago. Her smoking use included cigarettes. She started smoking about 61 years ago. She has a 110.00 pack-year smoking history. She has never used smokeless tobacco. She reports that she does not drink alcohol and does not use drugs.  No Known Allergies  Family History  Problem Relation Age of Onset   Cancer Mother    Breast cancer Mother    Cancer Father    Alzheimer's disease Father    Lung cancer Father        lung   Varicose  Veins Brother    Heart attack Brother     Prior to Admission medications   Medication Sig Start Date End Date Taking? Authorizing Provider  acetaminophen (TYLENOL) 500 MG tablet Take 1,000 mg by mouth 4 (four) times daily as needed.   Yes [provider]  albuterol (VENTOLIN HFA) 108 (90 Base) MCG/ACT inhaler TAKE 2 PUFFS BY MOUTH EVERY 6 HOURS AS NEEDED FOR WHEEZE OR SHORTNESS OF BREATH 02/23/23  Yes Kara Dies, NP  apixaban (ELIQUIS) 2.5 MG TABS tablet Take 1 tablet (2.5 mg total) by mouth 2 (two) times daily. 02/23/23  Yes Kara Dies, NP  cephALEXin (KEFLEX) 500 MG capsule Take 1  capsule (500 mg total) by mouth 4 (four) times daily for 10 days. 02/28/23 03/10/23 Yes Pilar Jarvis, MD  dronabinol (MARINOL) 5 MG capsule Take 5 mg by mouth 4 (four) times daily as needed.   Yes [provider]  furosemide (LASIX) 20 MG tablet Take 1 tablet (20 mg total) by mouth daily as needed for fluid or edema. 02/23/23  Yes Kara Dies, NP  ipratropium-albuterol (DUONEB) 0.5-2.5 (3) MG/3ML SOLN Take 3 mLs by nebulization every 6 (six) hours as needed (shortness of breath or wheezing). 02/23/23  Yes Kara Dies, NP  levothyroxine (SYNTHROID) 50 MCG tablet Take 1 tablet (50 mcg total) by mouth daily at 6 (six) AM. 02/23/23  Yes Kara Dies, NP  nystatin (MYCOSTATIN/NYSTOP) powder Apply 1 Application topically 3 (three) times daily. 02/28/23  Yes Pilar Jarvis, MD  oxyCODONE (OXY IR/ROXICODONE) 5 MG immediate release tablet Take 5 mg by mouth every 4 (four) hours as needed. 02/28/23  Yes [provider]  pantoprazole (PROTONIX) 40 MG tablet Take 1 tablet (40 mg total) by mouth daily. 02/23/23 08/22/23 Yes Kara Dies, NP  sulfamethoxazole-trimethoprim (BACTRIM DS) 800-160 MG tablet Take 1 tablet by mouth 2 (two) times daily for 10 days. 02/28/23 03/10/23 Yes Pilar Jarvis, MD  traMADol (ULTRAM) 50 MG tablet Take 1 tablet (50 mg total) by mouth every 6 (six) hours as needed for moderate pain or severe pain. 02/23/23  Yes Kara Dies, NP  Accu-Chek Softclix Lancets lancets 1 each 3 (three) times daily. 12/21/22   [provider]  acyclovir (ZOVIRAX) 400 MG tablet Take 400 mg by mouth daily. Patient not taking: Reported on 03/04/2023 08/09/22   [provider]  Blood Glucose Monitoring Suppl DEVI 1 each by Does not apply route in the morning, at noon, and at bedtime. May substitute to any manufacturer covered by patient's insurance. 12/21/22   Kara Dies, NP  clobetasol cream (TEMOVATE) 0.05 % Apply 1 Application topically daily. Patient not taking:  Reported on 03/04/2023 12/21/22   Kara Dies, NP  guaiFENesin-dextromethorphan (ROBITUSSIN DM) 100-10 MG/5ML syrup Take 5 mLs by mouth every 4 (four) hours as needed for cough. Patient not taking: Reported on 03/04/2023 12/17/22   Arnetha Courser, MD  insulin NPH-regular Human (70-30) 100 UNIT/ML injection Inject 4 Units into the skin 2 (two) times daily with a meal. Use only for hyperglycemia, CBG+ 300. Start with 5 units, titrate up to max 10 unit per dose if need. Patient not taking: Reported on 03/04/2023 01/06/23   Bethanie Dicker, NP  lenalidomide (REVLIMID) 5 MG capsule Take 1 capsule (5 mg total) by mouth daily. Take for 21 days, then hold for 7 days. Repeat every 28 days. Patient not taking: Reported on 03/04/2023 12/22/22   Creig Hines, MD  loperamide (IMODIUM) 2 MG capsule Take 1 capsule (2 mg total) by  mouth as needed for diarrhea or loose stools. Patient not taking: Reported on 03/04/2023 02/23/23   Kara Dies, NP  nystatin-triamcinolone ointment [facility]) Apply 1 Application topically 2 (two) times daily. Patient not taking: Reported on 03/04/2023 12/21/22   Kara Dies, NP  Tenofovir Alafenamide Fumarate (VEMLIDY) 25 MG TABS Take 1 tablet (25 mg total) by mouth daily. Patient not taking: Reported on 03/04/2023 11/26/22   Creig Hines, MD    Physical Exam: Vitals:   03/03/23 2306 03/03/23 2307 03/04/23 0100  BP: (!) 95/44  (!) 111/59  Pulse: 81  80  Resp: 18  13  Temp: 98.1 F (36.7 C)    TempSrc: Oral    SpO2: 96%    Weight:  59 kg   Height:  5\' 5"  (1.651 m)    Physical Exam Vitals and nursing note reviewed.  Constitutional:      General: She is not in acute distress. HENT:     Head: Normocephalic and atraumatic.  Cardiovascular:     Rate and Rhythm: Normal rate and regular rhythm.     Heart sounds: Normal heart sounds.  Pulmonary:     Effort: Pulmonary effort is normal.     Breath sounds: Normal breath sounds.  Abdominal:     Palpations: Abdomen is soft.      Tenderness: There is abdominal tenderness in the right lower quadrant.     Comments: Around ileostomy  Skin:    Comments: Pain warmth and erythema around ileostomy  Neurological:     Mental Status: Mental status is at baseline.     Labs on Admission: I have personally reviewed following labs and imaging studies  CBC: Recent Labs  Lab 02/27/23 1951 03/03/23 2258  WBC 4.4 4.8  HGB 11.3* 10.3*  HCT 36.5 32.9*  MCV 82.6 82.9  PLT 212 195   Basic Metabolic Panel: Recent Labs  Lab 02/27/23 1951 03/03/23 2258  NA 140 136  K 4.5 3.9  CL 107 106  CO2 25 23  GLUCOSE 173* 175*  BUN 38* 37*  CREATININE 0.74 0.85  CALCIUM 9.6 8.6*   GFR: Estimated Creatinine Clearance: 51.5 mL/min (by C-G formula based on SCr of 0.85 mg/dL). Liver Function Tests: Recent Labs  Lab 02/27/23 1951 03/03/23 2258  AST 16 11*  ALT 18 13  ALKPHOS 150* 121  BILITOT 0.7 0.6  PROT 6.0* 5.4*  ALBUMIN 3.7 3.3*   Recent Labs  Lab 02/27/23 1951 03/03/23 2258  LIPASE 42 30   No results for input(s): "AMMONIA" in the last 168 hours. Coagulation Profile: No results for input(s): "INR", "PROTIME" in the last 168 hours. Cardiac Enzymes: No results for input(s): "CKTOTAL", "CKMB", "CKMBINDEX", "TROPONINI" in the last 168 hours. BNP (last 3 results) No results for input(s): "PROBNP" in the last 8760 hours. HbA1C: No results for input(s): "HGBA1C" in the last 72 hours. CBG: No results for input(s): "GLUCAP" in the last 168 hours. Lipid Profile: No results for input(s): "CHOL", "HDL", "LDLCALC", "TRIG", "CHOLHDL", "LDLDIRECT" in the last 72 hours. Thyroid Function Tests: No results for input(s): "TSH", "T4TOTAL", "FREET4", "T3FREE", "THYROIDAB" in the last 72 hours. Anemia Panel: No results for input(s): "VITAMINB12", "FOLATE", "FERRITIN", "TIBC", "IRON", "RETICCTPCT" in the last 72 hours. Urine analysis:    Component Value Date/Time   COLORURINE YELLOW (A) 03/04/2023 0255   APPEARANCEUR  CLEAR (A) 03/04/2023 0255   LABSPEC 1.017 03/04/2023 0255   PHURINE 5.0 03/04/2023 0255   GLUCOSEU NEGATIVE 03/04/2023 0255   HGBUR NEGATIVE 03/04/2023 0255  BILIRUBINUR NEGATIVE 03/04/2023 0255   KETONESUR NEGATIVE 03/04/2023 0255   PROTEINUR NEGATIVE 03/04/2023 0255   NITRITE NEGATIVE 03/04/2023 0255   LEUKOCYTESUR TRACE (A) 03/04/2023 0255    Radiological Exams on Admission: No results found.   Data Reviewed: Relevant notes from primary care and specialist visits, past discharge summaries as available in EHR, including Care Everywhere. 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  Not meeting sepsis criteria at this time Patient previously on midodrine Received a 2 L fluid bolus in the ED  Ileostomy status (HCC) Ileostomy care  Nodal marginal zone B-cell lymphoma (HCC) No acute issues suspected  Chronic obstructive pulmonary disease (COPD) (HCC) Chronic respiratory failure with hypoxia COPD not acutely flared Continue DuoNebs as needed Continue supplemental oxygen  History of GI bleed History of hemorrhagic shock January 2024 No evidence of  bleeding.  Patient on chronic anticoagulation Monitor  Chronic anticoagulation Indication uncertain Will continue     DVT prophylaxis: Apixaban  Consults: none  Advance Care Planning:   Code Status: Prior   Family Communication: none  Disposition Plan: Back to previous home environment  Severity of Illness: The appropriate patient status for this patient is INPATIENT. Inpatient status is judged to be reasonable and necessary in order to provide the required intensity of service to ensure the patient's safety. The patient's presenting symptoms, physical exam findings, and initial radiographic and laboratory data in the context of their chronic comorbidities is felt to place them at high risk for further clinical deterioration. Furthermore, it is not anticipated that the patient will be medically stable for discharge from the hospital within 2 midnights of admission.   * I certify that at the point of admission it is my clinical judgment that the patient will require inpatient hospital care spanning beyond 2 midnights from the point of admission due to high intensity of service, high risk for further deterioration and high frequency of surveillance required.*  Author: Andris Baumann, MD 03/04/2023 3:24 AM  For on call review [email or web address].

## 2023-03-04 NOTE — Progress Notes (Signed)
Elink monitoring for the code sepsis protocol.  

## 2023-03-04 NOTE — Progress Notes (Signed)
Progress Note   Patient: Natasha [name] WUJ:[id number] DOB: 12/16/1945 DOA: 03/03/2023     0 DOS: the patient was seen and examined on 03/04/2023     Subjective:  Patient seen and examined at bedside this morning She complained of some itchiness around the site of her cellulitis in the abdominal area We added Benadryl for this She denied worsening abdominal pain chest pain cough nausea or vomiting   Brief hospital course:  From HPI from Dr. Para March "HPI: Natasha [name] is a 77 y.o. 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  Patient was seen in the ED on 02/27/2023 with redness and leaking around the ostomy site as well as diffuse abdominal pain.  Workup was unremarkable with normal WBC and lactic acid and CT abdomen and pelvis showed no obvious fluid collections.  She was discharged on Keflex and Bactrim as well as nystatin powder.  Blood cultures were negative.  Urine culture did grow Klebsiella that was sensitive to Bactrim.  Due to worsening redness, she was sent back to the ED for evaluation.  She has had no fever or chills or nausea or vomiting.  She has no abdominal pain . ED course and data review: BP was soft on arrival at 95/44, fluid responsive to 111/59 with otherwise normal vitals.  WBC normal at 4800 with normal lactic acid of 1.0.  Hemoglobin at baseline at 10.3.  Labs otherwise unremarkable.  Urinalysis pending. Patient was started on cefepime and vancomycin and given a 2 L LR bolus for initial suspicion of sepsis.  She was also treated with fentanyl for pain at the site of the redness. Hospitalist consulted for  admission. 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  Not meeting sepsis criteria at this time Patient previously on midodrine Received a 2 L fluid bolus in the ED   Ileostomy status (HCC) Ileostomy care   Nodal marginal zone B-cell lymphoma (HCC) No acute issues suspected   Chronic obstructive pulmonary disease (COPD) (HCC) Chronic respiratory failure with hypoxia COPD not in acute exacerbation Continue DuoNebs as needed Continue supplemental oxygen   History of GI bleed History of hemorrhagic shock January 2024 No evidence of bleeding.  Patient on chronic anticoagulation Monitor   Chronic anticoagulation Indication uncertain Will continue      DVT prophylaxis: Apixaban   Consults: none   Advance Care Planning:   Code Status: Prior    Family Communication: none  Physical Exam: Physical Exam Vitals and nursing note reviewed.  Constitutional:      General: She is not in acute distress. HENT:     Head: Normocephalic and atraumatic.  Cardiovascular:     Rate and Rhythm: Normal rate and  regular rhythm.     Heart sounds: Normal heart sounds.  Pulmonary:     Effort: Pulmonary effort is normal.     Breath sounds: Normal breath sounds.  Abdominal:     Palpations: Abdomen is soft.     Tenderness: There is abdominal tenderness in the right lower quadrant.     Comments: Around ileostomy.  Ileostomy remains in  place Skin:    Comments: Pain warmth and erythema around ileostomy  Neurological:     Mental Status: Mental status is at baseline.   Vitals:   03/03/23 2307 03/04/23 0100 03/04/23 0417 03/04/23 0921  BP:  (!) 111/59 (!) 111/57 (!) 106/52  Pulse:  80 81 81  Resp:  13 20 16   Temp:   99.1 F (37.3 C) 98.4 F (36.9 C)  TempSrc:   Oral   SpO2:   100% 97%  Weight: 59 kg     Height: 5\' 5"  (1.651 m)       Data Reviewed: I reviewed patient's laboratory results obtained yesterday showing WBC 4.3 hemoglobin 10.3 creatinine of 0.85 sodium 136  Family Communication: None present at bedside  Disposition: Status is: Inpatient Patient still needs antibiotic therapy  Planned Discharge Destination: Home   Time spent: 55 minutes  Author: Loyce Dys, MD 03/04/2023 3:04 PM  For on call review [email or web address].

## 2023-03-04 NOTE — Plan of Care (Signed)

## 2023-03-04 NOTE — Assessment & Plan Note (Signed)
History of hemorrhagic shock January 2024 No evidence of bleeding.  Patient on chronic anticoagulation Monitor

## 2023-03-04 NOTE — Assessment & Plan Note (Signed)
No acute issues suspected 

## 2023-03-04 NOTE — Assessment & Plan Note (Signed)
Indication uncertain Will continue

## 2023-03-04 NOTE — TOC Initial Note (Signed)
Transition of Care [facility] - Initial/Assessment Note    Patient Details  Name: Natasha [name] MRN: [id number] Date of Birth: 11-28-1945  Transition of [facility]) CM/SW Contact:    Liliana Cline, LCSW Phone Number: 03/04/2023, 10:37 AM  Clinical Narrative:                 Patient is from home with [facility] services, confirmed with Representative Misty.   Expected Discharge Plan: Home w Hospice Care Barriers to Discharge: Continued Medical Work up   Patient Goals and CMS Choice            Expected Discharge Plan and Services       Living arrangements for the past 2 months: Single Family Home                                      Prior Living Arrangements/Services Living arrangements for the past 2 months: Single Family Home                Current home services: DME    Activities of Daily Living Home Assistive Devices/Equipment: Environmental consultant (specify type) ADL Screening (condition at time of admission) Patient's cognitive ability adequate to safely complete daily activities?: Yes Is the patient deaf or have difficulty hearing?: No Does the patient have difficulty seeing, even when wearing glasses/contacts?: No Does the patient have difficulty concentrating, remembering, or making decisions?: No Patient able to express need for assistance with ADLs?: Yes Does the patient have difficulty dressing or bathing?: No Independently performs ADLs?: Yes (appropriate for developmental age) Does the patient have difficulty walking or climbing stairs?: No Weakness of Legs: None Weakness of Arms/Hands: None  Permission Sought/Granted                  Emotional Assessment              Admission diagnosis:  Cellulitis of abdominal wall [L03.311] Cellulitis, abdominal wall [L03.311] Patient Active Problem List   Diagnosis Date Noted   Cellulitis, abdominal wall 03/04/2023   Peristomal dermatitis and cellulitis 03/04/2023   Ileostomy status (HCC)  03/04/2023   Chronic anticoagulation 03/04/2023   History of GI bleed 03/04/2023   Palliative care encounter 01/12/2023   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   Urinary retention 01/07/2023   Effusion of left knee 01/07/2023   Urinary tract infection with hematuria 01/07/2023   Pancytopenia (HCC) 12/17/2022   Left leg cellulitis 12/14/2022   Acute GI bleeding 11/12/2022   Acute blood loss anemia 11/11/2022   Hemorrhagic shock (HCC) 11/07/2022   Overweight (BMI 25.0-29.9) 10/21/2022   AKI (acute kidney injury) (HCC) 10/21/2022   Anemia associated with chemotherapy 10/21/2022   Influenza A with pneumonia 10/20/2022   Neutropenic fever (HCC) 10/20/2022   Myocardial injury 10/20/2022   Type II diabetes mellitus with renal manifestations (HCC) 10/20/2022   Iron deficiency anemia 09/19/2022   COPD with acute exacerbation (HCC) 08/29/2022   Type 2 diabetes mellitus without complication, with long-term current use of insulin (HCC) 08/29/2022   Normocytic anemia 08/29/2022   Severe sepsis (HCC) 08/29/2022   Chronic respiratory failure with hypoxia (HCC) 08/16/2022   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   Swelling of lower extremity 07/24/2022   Hypotension 07/24/2022   Goals of care, counseling/discussion    COVID-19 virus infection 07/14/2022   Anemia of chronic disease 07/14/2022   Thrombocytopenia (HCC) 07/14/2022  Hypothyroidism, unspecified 07/14/2022   Chronic kidney disease, stage 3a (HCC) 07/14/2022   Bilateral pleural effusion 07/12/2022   Diabetes mellitus, type II (HCC) 07/12/2022   Acute on chronic respiratory failure with hypoxia and hypercarbia (HCC) 07/12/2022   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/12/2022   Lower extremity edema 05/12/2022   Other pancytopenia (HCC) 04/16/2022   Type 2 diabetes mellitus with proteinuria (HCC) 04/09/2022   Atherosclerosis of aorta (HCC) 04/09/2022   Chronic obstructive pulmonary disease (COPD) (HCC) 04/09/2022   SOB  (shortness of breath) on exertion 04/09/2022   Acute cough 04/09/2022   Post herpetic neuralgia 03/14/2020   Gall stones 10/23/2019   Lymphoma (HCC) 10/23/2019   Generalized lymphadenopathy 09/08/2018   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 [place] [address] Phone: [phone] Fax: [phone]  [facility] [place], [place] - [address] [place] [address] Phone: [phone] Fax: [phone]     Social Determinants of Health (SDOH) Social History: SDOH Screenings   Food Insecurity: No Food Insecurity (03/04/2023)  Housing: Low Risk  (03/04/2023)  Transportation Needs: No Transportation Needs (03/04/2023)  Recent Concern: Transportation Needs - Unmet Transportation Needs (01/07/2023)  Utilities: At Risk (03/04/2023)  Alcohol Screen: Low Risk  (08/24/2022)  Depression (PHQ2-9): Low Risk  (08/24/2022)  Financial Resource Strain: Medium Risk (08/24/2022)  Physical Activity: Inactive (08/24/2022)  Social Connections: Socially Isolated (08/24/2022)  Stress: Stress Concern Present (08/24/2022)  Tobacco Use: Medium Risk (03/03/2023)   SDOH Interventions:     Readmission Risk Interventions    11/08/2022   10:53 AM 10/21/2022    1:13 PM 08/17/2022   11:05 AM  Readmission Risk Prevention Plan  Transportation Screening Complete Complete Complete  PCP or Specialist Appt within 3-5 Days   Complete  HRI or Home Care Consult   Complete  Social Work Consult for Recovery Care Planning/Counseling   Complete  Palliative Care Screening   Not Applicable  Medication Review Oceanographer) Complete Complete Complete  PCP or Specialist appointment within 3-5 days of discharge Complete Complete   HRI or Home Care Consult Complete  Complete   SW Recovery Care/Counseling Consult Complete Complete   Palliative Care Screening Complete Not Applicable   Skilled Nursing Facility Not Applicable Not Applicable

## 2023-03-04 NOTE — Progress Notes (Signed)
PHARMACY -  BRIEF ANTIBIOTIC NOTE   Pharmacy has received consult(s) for Vancomycin, Cefepime  from an ED provider.  The patient's profile has been reviewed for ht/wt/allergies/indication/available labs.    One time order(s) placed for Vancomycin 1250 mg IV X 1 and Cefepime 2 gm IV X 1.   Further antibiotics/pharmacy consults should be ordered by admitting physician if indicated.                       Thank you, Trevel Dillenbeck D 03/04/2023  12:39 AM

## 2023-03-04 NOTE — Assessment & Plan Note (Deleted)
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

## 2023-03-05 LAB — BASIC METABOLIC PANEL
Anion gap: 4 — ABNORMAL LOW (ref 5–15)
BUN: 30 mg/dL — ABNORMAL HIGH (ref 8–23)
CO2: 20 mmol/L — ABNORMAL LOW (ref 22–32)
Calcium: 8.3 mg/dL — ABNORMAL LOW (ref 8.9–10.3)
Chloride: 112 mmol/L — ABNORMAL HIGH (ref 98–111)
Creatinine, Ser: 0.77 mg/dL (ref 0.44–1.00)
GFR, Estimated: >60 mL/min (ref >60)
Glucose, Bld: 245 mg/dL — ABNORMAL HIGH (ref 70–99)
Potassium: 3.9 mmol/L (ref 3.5–5.1)
Sodium: 136 mmol/L (ref 135–145)

## 2023-03-05 LAB — CBC WITH DIFFERENTIAL/PLATELET
Abs Immature Granulocytes: 0 10*3/uL (ref 0.00–0.07)
Basophils Absolute: 0 10*3/uL (ref 0.0–0.1)
Basophils Relative: 1 %
Eosinophils Absolute: 0.4 10*3/uL (ref 0.0–0.5)
Eosinophils Relative: 13 %
HCT: 29.8 % — ABNORMAL LOW (ref 36.0–46.0)
Hemoglobin: 9.6 g/dL — ABNORMAL LOW (ref 12.0–15.0)
Immature Granulocytes: 0 %
Lymphocytes Relative: 34 %
Lymphs Abs: 1 10*3/uL (ref 0.7–4.0)
MCH: 26.2 pg (ref 26.0–34.0)
MCHC: 32.2 g/dL (ref 30.0–36.0)
MCV: 81.4 fL (ref 80.0–100.0)
Monocytes Absolute: 0.2 10*3/uL (ref 0.1–1.0)
Monocytes Relative: 9 %
Neutro Abs: 1.2 10*3/uL — ABNORMAL LOW (ref 1.7–7.7)
Neutrophils Relative %: 43 %
Platelets: 162 10*3/uL (ref 150–400)
RBC: 3.66 MIL/uL — ABNORMAL LOW (ref 3.87–5.11)
RDW: 16.8 % — ABNORMAL HIGH (ref 11.5–15.5)
WBC: 2.8 10*3/uL — ABNORMAL LOW (ref 4.0–10.5)
nRBC: 0 % (ref 0.0–0.2)

## 2023-03-05 LAB — GLUCOSE, CAPILLARY
Glucose-Capillary: 195 mg/dL — ABNORMAL HIGH (ref 70–99)
Glucose-Capillary: 154 mg/dL — ABNORMAL HIGH (ref 70–99)
Glucose-Capillary: 96 mg/dL (ref 70–99)
Glucose-Capillary: 142 mg/dL — ABNORMAL HIGH (ref 70–99)

## 2023-03-05 LAB — CULTURE, BLOOD (ROUTINE X 2): Special Requests: ADEQUATE

## 2023-03-05 NOTE — Progress Notes (Signed)
[facility]) hospitalized hospice patient visit  Natasha [name] is a current [facility] patient with a terminal diagnosis of unspecified B-cell lymphoma. She has been ongoing issues with infection and pain around ostomy site. She has been leaving ostomy uncovered in the home and [facility] team has been working on solutions including different medications and ostomy care. She presented to ED on the evening of 5.9 and was admitted on 5.10 with a diagnosis of abdominal wall cellulitis. Per Dr. Patric Dykes with [facility] this is a related hospital admission.   Visited at bedside. Patient reports she has just had pain medicine for her stomach pain. She reports she feels she is improving as the redness on her abdomen is getting better. She remains inpatient appropriate due to need for IVAB for cellulitis.   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  Not meeting sepsis criteria at this time Patient previously on midodrine Received a 2 L fluid bolus in the ED   Ileostomy status (HCC) Ileostomy care   Nodal marginal zone B-cell lymphoma (HCC) No acute issues suspected   Chronic obstructive  pulmonary disease (COPD) (HCC) Chronic respiratory failure with hypoxia COPD not in acute exacerbation Continue DuoNebs as needed Continue supplemental oxygen   History of GI bleed History of hemorrhagic shock January 2024 No evidence of bleeding.  Patient on chronic anticoagulation Monitor   Discharge Planning- Ongoing, plan to return home once treatment complete Family Contact- Patient alert and oriented and able to speak for herself IDT- Updated Goals of care - Ongoing, patient is DNR but wants treatment for acute episode. Thea Gist, Charity fundraiser, [facility] Liaison [phone]

## 2023-03-05 NOTE — Progress Notes (Signed)
Progress Note   Patient: Natasha [name] ZOX:[id number] DOB: [birth year] DOA: 03/03/2023     1 DOS: the patient was seen and examined on 03/05/2023      Subjective:  Patient seen and examined at bedside this morning Abdominal redness and itchiness much better today She denied worsening abdominal pain chest pain cough nausea or vomiting     Brief hospital course:   From HPI from Dr. Para March "HPI: Natasha [name] is a 77 y.o. 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  Patient was seen in the ED on 02/27/2023 with redness and leaking around the ostomy site as well as diffuse abdominal pain.  Workup was unremarkable with normal WBC and lactic acid and CT abdomen and pelvis showed no obvious fluid collections.  She was discharged on Keflex and Bactrim as well as nystatin powder.  Blood cultures were negative.  Urine culture did grow Klebsiella that was sensitive to Bactrim.  Due to worsening redness, she was sent back to the ED for evaluation.  She has had no fever or chills or nausea or vomiting.  She has no abdominal pain . ED course and data review: BP was soft on arrival at 95/44, fluid responsive to 111/59 with otherwise normal vitals.  WBC normal at 4800 with normal lactic acid of 1.0.  Hemoglobin at baseline at 10.3.  Labs otherwise unremarkable.  Urinalysis pending. Patient was started on cefepime and vancomycin and given a 2 L LR bolus for initial suspicion of sepsis.  She was also treated with fentanyl for pain at the site of the redness. Hospitalist consulted for admission. 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  Not meeting sepsis criteria at this time Patient previously on midodrine Received a 2 L fluid bolus in the ED   Ileostomy status (HCC) Ileostomy care   Nodal marginal zone B-cell lymphoma (HCC) No acute issues suspected   Chronic obstructive pulmonary disease (COPD) (HCC) Chronic respiratory failure with hypoxia COPD not in acute exacerbation Continue DuoNebs as needed Continue supplemental oxygen   History of GI bleed History of hemorrhagic shock January 2024 No evidence of bleeding.  Patient on chronic anticoagulation Monitor   Chronic anticoagulation Indication uncertain Will continue       DVT prophylaxis: Apixaban   Consults: none   Advance Care Planning:   Code Status: Prior    Family Communication: none   Physical Exam: Physical Exam Vitals and nursing note reviewed.  Constitutional:      General: She is not in acute distress. HENT:     Head: Normocephalic and atraumatic.  Cardiovascular:     Rate and Rhythm: Normal rate and regular rhythm.     Heart sounds: Normal heart sounds.  Pulmonary:     Effort: Pulmonary effort is normal.     Breath sounds: Normal breath sounds.  Abdominal:     Palpations: Abdomen is soft.     Tenderness: Abdominal tenderness improved and erythema better today    Comments:  Around ileostomy.  Ileostomy remains in place Skin:    Comments: Pain warmth and erythema around ileostomy  Neurological:     Mental Status: Mental status is at baseline.   Data Reviewed: I reviewed patient's laboratory results obtained today showing sodium 133 potassium 3.9 creatinine 0.7   Family Communication: None present at bedside   Disposition: Status is: Inpatient Patient still needs antibiotic therapy  Planned Discharge Destination: Home  Total time spent: 40 minutes  Vitals:   03/04/23 0921 03/04/23 1604 03/05/23 0043 03/05/23 0809  BP: (!) 106/52 (!) 105/53 (!) 111/56 103/60  Pulse: 81 81 84 74  Resp: 16 16 18 17   Temp: 98.4 F (36.9 C) 98.5 F (36.9 C) 98.3 F (36.8 C) 98.1 F (36.7 C)  TempSrc:      SpO2: 97% 98% 97%   Weight:      Height:        Author: Loyce Dys, MD 03/05/2023 1:54 PM  For on call review [email or web address].

## 2023-03-05 NOTE — Plan of Care (Signed)

## 2023-03-05 NOTE — Progress Notes (Signed)
Mobility Specialist - Progress Note   03/05/23 1300  Mobility  Activity Ambulated with assistance to bathroom  Level of Assistance Standby assist, set-up cues, supervision of patient - no hands on  Assistive Device Front wheel walker  Distance Ambulated (ft) 15 ft  Activity Response Tolerated well  Mobility Referral Yes  $Mobility charge 1 Mobility  Mobility Specialist Start Time (ACUTE ONLY) 0152  Mobility Specialist Stop Time (ACUTE ONLY) 0158  Mobility Specialist Time Calculation (min) (ACUTE ONLY) 6 min   Pt supine in bed on RA upon arrival. Pt ambulates to/from bathroom SBA. Pt returns to bed with needs in reach and bed alarm set.   Terrilyn Saver  Mobility Specialist  03/05/23 2:01 PM

## 2023-03-06 ENCOUNTER — Other Ambulatory Visit: Payer: Self-pay

## 2023-03-06 LAB — CBC WITH DIFFERENTIAL/PLATELET
Abs Immature Granulocytes: 0.01 10*3/uL (ref 0.00–0.07)
Basophils Absolute: 0 10*3/uL (ref 0.0–0.1)
Basophils Relative: 0 %
Eosinophils Absolute: 0.3 10*3/uL (ref 0.0–0.5)
Eosinophils Relative: 12 %
HCT: 30.8 % — ABNORMAL LOW (ref 36.0–46.0)
Hemoglobin: 9.7 g/dL — ABNORMAL LOW (ref 12.0–15.0)
Immature Granulocytes: 0 %
Lymphocytes Relative: 37 %
Lymphs Abs: 1.1 10*3/uL (ref 0.7–4.0)
MCH: 25.5 pg — ABNORMAL LOW (ref 26.0–34.0)
MCHC: 31.5 g/dL (ref 30.0–36.0)
MCV: 81.1 fL (ref 80.0–100.0)
Monocytes Absolute: 0.3 10*3/uL (ref 0.1–1.0)
Monocytes Relative: 10 %
Neutro Abs: 1.2 10*3/uL — ABNORMAL LOW (ref 1.7–7.7)
Neutrophils Relative %: 41 %
Platelets: 171 10*3/uL (ref 150–400)
RBC: 3.8 MIL/uL — ABNORMAL LOW (ref 3.87–5.11)
RDW: 16.5 % — ABNORMAL HIGH (ref 11.5–15.5)
WBC: 2.9 10*3/uL — ABNORMAL LOW (ref 4.0–10.5)
nRBC: 0 % (ref 0.0–0.2)

## 2023-03-06 LAB — CULTURE, BLOOD (ROUTINE X 2)

## 2023-03-06 LAB — BASIC METABOLIC PANEL
Anion gap: 4 — ABNORMAL LOW (ref 5–15)
BUN: 29 mg/dL — ABNORMAL HIGH (ref 8–23)
CO2: 23 mmol/L (ref 22–32)
Calcium: 8.6 mg/dL — ABNORMAL LOW (ref 8.9–10.3)
Chloride: 110 mmol/L (ref 98–111)
Creatinine, Ser: 0.73 mg/dL (ref 0.44–1.00)
GFR, Estimated: >60 mL/min (ref >60)
Glucose, Bld: 162 mg/dL — ABNORMAL HIGH (ref 70–99)
Potassium: 4 mmol/L (ref 3.5–5.1)
Sodium: 137 mmol/L (ref 135–145)

## 2023-03-06 LAB — GLUCOSE, CAPILLARY
Glucose-Capillary: 135 mg/dL — ABNORMAL HIGH (ref 70–99)
Glucose-Capillary: 166 mg/dL — ABNORMAL HIGH (ref 70–99)

## 2023-03-06 MED ORDER — CLINDAMYCIN HCL 300 MG PO CAPS: 300.0000 mg | ORAL_CAPSULE | Freq: Three times a day (TID) | ORAL | 0 refills | Status: AC

## 2023-03-06 MED ORDER — CHLORHEXIDINE GLUCONATE 0.12 % MT SOLN
OROMUCOSAL | Status: AC
  Filled 2023-03-06: qty 30

## 2023-03-06 NOTE — Progress Notes (Signed)
Mobility Specialist - Progress Note   03/06/23 0913  Mobility  Activity Ambulated independently in hallway;Stood at bedside;Dangled on edge of bed  Level of Assistance Independent  Assistive Device None  Distance Ambulated (ft) 180 ft  Activity Response Tolerated well  Mobility Referral Yes  $Mobility charge 1 Mobility  Mobility Specialist Start Time (ACUTE ONLY) 0855  Mobility Specialist Stop Time (ACUTE ONLY) 0911  Mobility Specialist Time Calculation (min) (ACUTE ONLY) 16 min   Pt supine in bed on RA upon arrival. Pt STS and ambulates in hallway indep with no LOB noted. Pt returns to bed with needs in reach.   Terrilyn Saver  Mobility Specialist  03/06/23 9:14 AM

## 2023-03-06 NOTE — Discharge Summary (Signed)
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  Patient was seen in the ED on 02/27/2023 with redness and leaking around the ostomy site as well as diffuse abdominal pain.  Workup was unremarkable with normal WBC and lactic acid and CT abdomen and pelvis showed no obvious fluid collections.  She was discharged on Keflex and Bactrim as well as nystatin powder.  Blood cultures were negative.  Urine culture did grow Klebsiella that was sensitive to Bactrim.  Due to worsening redness, she was sent back to the ED for evaluation.  Admission patient was managed with ceftriaxone with significant improvement and resolution of her right index.  The results are negative to date.  Patient is therefore being discharged to complete outpatient antibiotic and to follow-up with hospice   Consultants: None Procedures performed: None Disposition:  Home Diet recommendation:  Discharge Diet Orders (From admission, onward)     Start     Ordered   03/06/23 0000  Diet - low sodium heart healthy        03/06/23 1324           Cardiac diet DISCHARGE MEDICATION: Allergies as of 03/06/2023   No Known Allergies      Medication List     STOP taking these medications    acyclovir 400 MG tablet Commonly known as: ZOVIRAX   cephALEXin 500 MG capsule Commonly known as: KEFLEX   clobetasol cream 0.05 % Commonly known as: TEMOVATE   guaiFENesin-dextromethorphan 100-10 MG/5ML syrup Commonly known as: ROBITUSSIN DM   lenalidomide 5 MG capsule Commonly known as: REVLIMID   loperamide 2 MG capsule Commonly known as: IMODIUM   nystatin-triamcinolone ointment Commonly known as: MYCOLOG   sulfamethoxazole-trimethoprim 800-160 MG tablet Commonly known as: BACTRIM DS   Vemlidy 25 MG Tabs Generic drug: Tenofovir Alafenamide Fumarate       TAKE these medications    Accu-Chek Softclix Lancets lancets 1 each 3 (three) times daily.   acetaminophen 500 MG tablet Commonly known as: TYLENOL Take 1,000 mg by mouth 4 (four) times daily as needed.   albuterol 108 (90 Base) MCG/ACT inhaler Commonly known as: VENTOLIN HFA TAKE 2 PUFFS BY MOUTH EVERY 6 HOURS AS NEEDED FOR WHEEZE OR SHORTNESS OF BREATH   apixaban 2.5 MG Tabs tablet Commonly known as: Eliquis Take 1 tablet (2.5 mg total) by mouth 2 (two) times daily.   Blood Glucose Monitoring Suppl Devi 1 each by Does not apply route in the morning, at noon, and at bedtime. May substitute to any manufacturer covered  by patient's insurance.   clindamycin 300 MG capsule Commonly known as: CLEOCIN Take 1 capsule (300 mg total) by mouth 3 (three) times daily for 10 days.   dronabinol 5 MG capsule Commonly known as: MARINOL Take 5 mg by mouth 4 (four) times daily as needed.   furosemide 20 MG tablet Commonly known as: LASIX Take 1 tablet (20 mg total) by mouth daily as  needed for fluid or edema.   insulin NPH-regular Human (70-30) 100 UNIT/ML injection Inject 4 Units into the skin 2 (two) times daily with a meal. Use only for hyperglycemia, CBG+ 300. Start with 5 units, titrate up to max 10 unit per dose if need.   ipratropium-albuterol 0.5-2.5 (3) MG/3ML Soln Commonly known as: DUONEB Take 3 mLs by nebulization every 6 (six) hours as needed (shortness of breath or wheezing).   levothyroxine 50 MCG tablet Commonly known as: SYNTHROID Take 1 tablet (50 mcg total) by mouth daily at 6 (six) AM.   nystatin powder Commonly known as: MYCOSTATIN/NYSTOP Apply 1 Application topically 3 (three) times daily.   oxyCODONE 5 MG immediate release tablet Commonly known as: Oxy IR/ROXICODONE Take 5 mg by mouth every 4 (four) hours as needed.   pantoprazole 40 MG tablet Commonly known as: Protonix Take 1 tablet (40 mg total) by mouth daily.   traMADol 50 MG tablet Commonly known as: ULTRAM Take 1 tablet (50 mg total) by mouth every 6 (six) hours as needed for moderate pain or severe pain.        Discharge Exam: Filed Weights   03/03/23 2307  Weight: 59 kg   Physical Exam Vitals and nursing note reviewed.  Constitutional:      General: She is not in acute distress. HENT:     Head: Normocephalic and atraumatic.  Cardiovascular:     Rate and Rhythm: Normal rate and regular rhythm.     Heart sounds: Normal heart sounds.  Pulmonary:     Effort: Pulmonary effort is normal.     Breath sounds: Normal breath sounds.  Abdominal:     Palpations: Abdomen is soft.     Tenderness: Tenderness and erythema resolved. ileostomy remains in place Neurological:     Mental Status: Mental status is at baseline.   Condition at discharge: good  Discharge time spent: greater than 30 minutes.  Signed: Loyce Dys, MD [facility] 03/06/2023

## 2023-03-06 NOTE — Progress Notes (Signed)
[facility] 140 [employer] [facility]) Hospital Liaison Note  Natasha [name] is a current [facility] patient with a terminal diagnosis of unspecified B-cell lymphoma. She has been ongoing issues with infection and pain around ostomy site. She has been leaving ostomy uncovered in the home and [facility] team has been working on solutions including different medications and ostomy care. She presented to ED on the evening of 5.9 and was admitted on 5.10 with a diagnosis of abdominal wall cellulitis. Per Dr. Patric Dykes with [facility] this is a related hospital admission.   Visited at bedside. Patient sleeping soundly and did not arouse to gentle verbal stimuli.  Patient remains inpatient appropriate for treatment of cellulitis with IV antibiotics.  V/S: 98.5, 104/62, 72, 16, 100% on room air  I/O: 820/620  Abnormal Labs: Glucose: 162 (H) BUN: 29 (H) Calcium: 8.6 (L) Anion gap: 4 (L) WBC: 2.9 (L) RBC: 3.80 (L) Hemoglobin: 9.7 (L) HCT: 30.8 (L) MCH: 25.5 (L) RDW: 16.5 (H) NEUT#: 1.2 (L)  Diagnostics: None new  IV/PRN Medications: Rocephin 1 g Q 24 hours, Diphenhydramine 25 mg po X 1, Oxycodone 5 mg IR X 2  Assement/Problem List: See Discharge Summary  Dischare Planning: Plan to return home with the support of hospice services.  Family contact: None  IDT: updated  Goals of care: Ongoing; DNR but wants treatment for the treatable.  Please call with any hospice related questions or concerns.  Thank you, Haynes Bast, BSN, RN [facility] Liaison [phone]

## 2023-03-07 LAB — CULTURE, BLOOD (ROUTINE X 2)
Culture: NO GROWTH
Culture: NO GROWTH

## 2023-03-08 ENCOUNTER — Other Ambulatory Visit: Payer: Self-pay

## 2023-03-08 LAB — CULTURE, BLOOD (ROUTINE X 2)

## 2023-03-09 LAB — CULTURE, BLOOD (ROUTINE X 2): Special Requests: ADEQUATE

## 2023-03-10 ENCOUNTER — Other Ambulatory Visit: Payer: Self-pay | Admitting: Nurse Practitioner

## 2023-03-10 ENCOUNTER — Telehealth: Payer: Self-pay | Admitting: Nurse Practitioner

## 2023-03-10 MED ORDER — LEVOTHYROXINE SODIUM 50 MCG PO TABS: 50.0000 ug | ORAL_TABLET | Freq: Every day | ORAL | 1 refills | Status: AC

## 2023-03-10 NOTE — Telephone Encounter (Signed)
Viviann Spare from [facility] called stating pt need a refill on levothyroxine

## 2023-03-10 NOTE — Telephone Encounter (Signed)
Pt has been notified.

## 2023-03-16 ENCOUNTER — Encounter: Payer: Self-pay | Admitting: Oncology

## 2023-03-23 ENCOUNTER — Emergency Department
Admission: EM | Admit: 2023-03-23 | Discharge: 2023-03-23 | Disposition: A | Discharge status: Home / Self Care | Attending: Emergency Medicine | Admitting: Emergency Medicine

## 2023-03-23 ENCOUNTER — Encounter: Payer: Self-pay | Admitting: Oncology

## 2023-03-23 ENCOUNTER — Emergency Department

## 2023-03-23 ENCOUNTER — Other Ambulatory Visit: Payer: Self-pay

## 2023-03-23 DIAGNOSIS — R6889 Other general symptoms and signs: Secondary | ICD-10-CM | POA: Diagnosis not present

## 2023-03-23 DIAGNOSIS — L03311 Cellulitis of abdominal wall: Secondary | ICD-10-CM | POA: Insufficient documentation

## 2023-03-23 DIAGNOSIS — E119 Type 2 diabetes mellitus without complications: Secondary | ICD-10-CM | POA: Diagnosis not present

## 2023-03-23 DIAGNOSIS — Z8572 Personal history of non-Hodgkin lymphomas: Secondary | ICD-10-CM | POA: Insufficient documentation

## 2023-03-23 DIAGNOSIS — J449 Chronic obstructive pulmonary disease, unspecified: Secondary | ICD-10-CM | POA: Insufficient documentation

## 2023-03-23 DIAGNOSIS — R609 Edema, unspecified: Secondary | ICD-10-CM | POA: Diagnosis not present

## 2023-03-23 DIAGNOSIS — R1084 Generalized abdominal pain: Secondary | ICD-10-CM | POA: Diagnosis not present

## 2023-03-23 DIAGNOSIS — R109 Unspecified abdominal pain: Secondary | ICD-10-CM | POA: Diagnosis not present

## 2023-03-23 DIAGNOSIS — I700 Atherosclerosis of aorta: Secondary | ICD-10-CM | POA: Diagnosis not present

## 2023-03-23 DIAGNOSIS — Z743 Need for continuous supervision: Secondary | ICD-10-CM | POA: Diagnosis not present

## 2023-03-23 LAB — COMPREHENSIVE METABOLIC PANEL
ALT: 22 U/L (ref 0–44)
AST: 18 U/L (ref 15–41)
Albumin: 3.6 g/dL (ref 3.5–5.0)
Alkaline Phosphatase: 107 U/L (ref 38–126)
Anion gap: 7 (ref 5–15)
BUN: 36 mg/dL — ABNORMAL HIGH (ref 8–23)
CO2: 25 mmol/L (ref 22–32)
Calcium: 9 mg/dL (ref 8.9–10.3)
Chloride: 108 mmol/L (ref 98–111)
Creatinine, Ser: 0.93 mg/dL (ref 0.44–1.00)
GFR, Estimated: >60 mL/min (ref >60)
Glucose, Bld: 177 mg/dL — ABNORMAL HIGH (ref 70–99)
Potassium: 3.5 mmol/L (ref 3.5–5.1)
Sodium: 140 mmol/L (ref 135–145)
Total Bilirubin: 0.7 mg/dL (ref 0.3–1.2)
Total Protein: 5.8 g/dL — ABNORMAL LOW (ref 6.5–8.1)

## 2023-03-23 LAB — CBC
HCT: 35.6 % — ABNORMAL LOW (ref 36.0–46.0)
Hemoglobin: 11 g/dL — ABNORMAL LOW (ref 12.0–15.0)
MCH: 25.4 pg — ABNORMAL LOW (ref 26.0–34.0)
MCHC: 30.9 g/dL (ref 30.0–36.0)
MCV: 82.2 fL (ref 80.0–100.0)
Platelets: 177 10*3/uL (ref 150–400)
RBC: 4.33 MIL/uL (ref 3.87–5.11)
RDW: 16.6 % — ABNORMAL HIGH (ref 11.5–15.5)
WBC: 5 10*3/uL (ref 4.0–10.5)
nRBC: 0 % (ref 0.0–0.2)

## 2023-03-23 LAB — LIPASE, BLOOD: Lipase: 33 U/L (ref 11–51)

## 2023-03-23 MED ORDER — CLINDAMYCIN HCL 300 MG PO CAPS: 300.0000 mg | ORAL_CAPSULE | Freq: Three times a day (TID) | ORAL | 0 refills | Status: AC

## 2023-03-23 MED ORDER — IOHEXOL 300 MG/ML  SOLN
100.0000 mL | Freq: Once | INTRAMUSCULAR | Status: AC | PRN
  Administered 2023-03-23: 100 mL via INTRAVENOUS

## 2023-03-23 NOTE — ED Triage Notes (Signed)
First Nurse Note;  Pt via EMS from home. States ileostomy site is swollen red and tender. Denies any pain. Pt is A&Ox4 and NAD. Pt is Hospice pt.  112/65 BP 71 HR  99% on RA  36 ETCO2  20 RR 190 CBG  97.8 oral

## 2023-03-23 NOTE — ED Provider Notes (Signed)
[facility] Provider Note    Event Date/Time   First MD Initiated Contact with Patient 03/23/23 1719     (approximate)   History   Abdominal Pain   HPI  Natasha [name] is a 77 y.o. female past medical history significant for diabetes, COPD on chronic 4 L, marginal cell lymphoma, history of GI bleed, colectomy and end ileostomy for volvulus currently on hospice, who presents to the emergency department for abdominal pain and swelling.  Patient had a recent admission for abdominal wall cellulitis.  Was treated with IV antibiotics and ultimately discharged home on clindamycin.  Did have a urine culture that grew out Klebsiella that was sensitive to Bactrim.  Patient returns to the emergency department for worsening pain and swelling to her abdomen.  Noted some mild redness to the right side of her abdomen and was concerned that it could be return of her cellulitis so she came into the emergency department.  Denies being on any antibiotics at this time.     Physical Exam   Triage Vital Signs: ED Triage Vitals  Enc Vitals Group     BP 03/23/23 1613 111/70     Pulse Rate 03/23/23 1613 74     Resp 03/23/23 1613 18     Temp 03/23/23 1613 97.6 F (36.4 C)     Temp src --      SpO2 03/23/23 1613 100 %     Weight 03/23/23 1614 130 lb (59 kg)     Height 03/23/23 1614 5\' 5"  (1.651 m)     Head Circumference --      Peak Flow --      Pain Score 03/23/23 1613 0     Pain Loc --      Pain Edu? --      Excl. in GC? --     Most recent vital signs: Vitals:   03/23/23 1930 03/23/23 2030  BP: 109/71 112/87  Pulse: 67 66  Resp:  16  Temp:    SpO2: 100% 96%    Physical Exam Constitutional:      Appearance: She is well-developed.  HENT:     Head: Atraumatic.  Eyes:     Conjunctiva/sclera: Conjunctivae normal.  Cardiovascular:     Rate and Rhythm: Regular rhythm.  Pulmonary:     Effort: No respiratory distress.  Abdominal:     General: There is no  distension.     Tenderness: There is abdominal tenderness.     Comments: 2 stomas in place, right stoma with surrounding erythema.  No significant induration or crepitus.  Musculoskeletal:        General: Normal range of motion.     Cervical back: Normal range of motion.  Skin:    General: Skin is warm.  Neurological:     Mental Status: She is alert. Mental status is at baseline.     IMPRESSION / MDM / ASSESSMENT AND PLAN / ED COURSE  I reviewed the triage vital signs and the nursing notes.  Differential diagnosis including abdominal wall cellulitis, small bowel obstruction, intra-abdominal abscess   RADIOLOGY I independently reviewed imaging, my interpretation of imaging: CT abdomen and pelvis with contrast no signs of a bowel obstruction.  Read as no acute findings.  LABS (all labs ordered are listed, but only abnormal results are displayed) Labs interpreted as -    Labs Reviewed  COMPREHENSIVE METABOLIC PANEL - Abnormal; Notable for the following components:      Result  Value   Glucose, Bld 177 (*)    BUN 36 (*)    Total Protein 5.8 (*)    All other components within normal limits  CBC - Abnormal; Notable for the following components:   Hemoglobin 11.0 (*)    HCT 35.6 (*)    MCH 25.4 (*)    RDW 16.6 (*)    All other components within normal limits  LIPASE, BLOOD     MDM No significant leukocytosis.  Anemia but hemoglobin is stable at baseline.  Creatinine at baseline.  No significant electrolyte abnormalities.  Concern for recurrence of possible abdominal wall cellulitis.  Will restart the patient on clindamycin.  Discussed close follow-up with primary care physician and her hospice team.  Given return precautions for any worsening symptoms.      PROCEDURES:  Critical Care performed: No  Procedures  Patient's presentation is most consistent with acute presentation with potential threat to life or bodily function.   MEDICATIONS ORDERED IN  ED: Medications  iohexol (OMNIPAQUE) 300 MG/ML solution 100 mL (100 mLs Intravenous Contrast Given 03/23/23 1943)    FINAL CLINICAL IMPRESSION(S) / ED DIAGNOSES   Final diagnoses:  Generalized abdominal pain  Abdominal wall cellulitis     Rx / DC Orders   ED Discharge Orders          Ordered    clindamycin (CLEOCIN) 300 MG capsule  3 times daily        03/23/23 2036             Note:  This document was prepared using Dragon voice recognition software and may include unintentional dictation errors.   Corena Herter, MD 03/23/23 2328

## 2023-03-23 NOTE — Discharge Instructions (Addendum)
You were seen in the emergency department for abdominal pain.  You had a CT scan that did not show any signs of an abscess or bowel obstruction.  You are restarted on antibiotics for concern for cellulitis.  Follow-up closely with your primary care physician.  Return to the emergency department for any worsening symptoms.

## 2023-03-23 NOTE — ED Triage Notes (Signed)
Pt to ED [facility] From home for swelling and increased redness and pain to ileostomy site. Reports started today.  Swelling noted.

## 2023-03-28 ENCOUNTER — Telehealth: Payer: Self-pay | Admitting: Surgery

## 2023-03-28 NOTE — Telephone Encounter (Signed)
Dr. Everlene Farrier, please advise. Unfortunately can't get this patient in tomorrow to see Ian Malkin as it takes them 3 days to coordinate transportation for this patient.

## 2023-03-28 NOTE — Telephone Encounter (Signed)
Please call [place] @ [phone] option 1, he is the care coordinator for this patient.  She had exploratory lap and right colectomy done on 01/09/23 with Dr. Everlene Farrier.  He states that she is having a lot of pain at the ileostomy site and has stopped draining now for over 24 hours.  Please call [place].  Thank you.

## 2023-03-28 NOTE — Telephone Encounter (Signed)
Outgoing call to [place] at [employer], asked if we could possibly get her in tomorrow afternoon to see our PA.  He said unfortunately can't do that as transportation takes 3 days to coordinate.

## 2023-03-29 ENCOUNTER — Telehealth: Payer: Self-pay | Admitting: Surgery

## 2023-03-29 ENCOUNTER — Emergency Department

## 2023-03-29 ENCOUNTER — Other Ambulatory Visit: Payer: Self-pay

## 2023-03-29 ENCOUNTER — Encounter: Payer: Self-pay | Admitting: Oncology

## 2023-03-29 ENCOUNTER — Emergency Department
Admission: EM | Admit: 2023-03-29 | Discharge: 2023-03-30 | Disposition: A | Discharge status: Home / Self Care | Attending: Emergency Medicine | Admitting: Emergency Medicine

## 2023-03-29 ENCOUNTER — Encounter: Payer: Self-pay | Admitting: Radiology

## 2023-03-29 DIAGNOSIS — J449 Chronic obstructive pulmonary disease, unspecified: Secondary | ICD-10-CM | POA: Insufficient documentation

## 2023-03-29 DIAGNOSIS — K8020 Calculus of gallbladder without cholecystitis without obstruction: Secondary | ICD-10-CM | POA: Diagnosis not present

## 2023-03-29 DIAGNOSIS — J90 Pleural effusion, not elsewhere classified: Secondary | ICD-10-CM | POA: Diagnosis not present

## 2023-03-29 DIAGNOSIS — I5030 Unspecified diastolic (congestive) heart failure: Secondary | ICD-10-CM | POA: Insufficient documentation

## 2023-03-29 DIAGNOSIS — E1122 Type 2 diabetes mellitus with diabetic chronic kidney disease: Secondary | ICD-10-CM | POA: Diagnosis not present

## 2023-03-29 DIAGNOSIS — J9311 Primary spontaneous pneumothorax: Secondary | ICD-10-CM

## 2023-03-29 DIAGNOSIS — Z856 Personal history of leukemia: Secondary | ICD-10-CM | POA: Diagnosis not present

## 2023-03-29 DIAGNOSIS — Z87891 Personal history of nicotine dependence: Secondary | ICD-10-CM | POA: Diagnosis not present

## 2023-03-29 DIAGNOSIS — Z8616 Personal history of COVID-19: Secondary | ICD-10-CM | POA: Diagnosis not present

## 2023-03-29 DIAGNOSIS — J939 Pneumothorax, unspecified: Secondary | ICD-10-CM | POA: Diagnosis not present

## 2023-03-29 DIAGNOSIS — R9431 Abnormal electrocardiogram [ECG] [EKG]: Secondary | ICD-10-CM | POA: Diagnosis not present

## 2023-03-29 DIAGNOSIS — N1831 Chronic kidney disease, stage 3a: Secondary | ICD-10-CM | POA: Insufficient documentation

## 2023-03-29 DIAGNOSIS — E039 Hypothyroidism, unspecified: Secondary | ICD-10-CM | POA: Insufficient documentation

## 2023-03-29 DIAGNOSIS — Z8571 Personal history of Hodgkin lymphoma: Secondary | ICD-10-CM | POA: Insufficient documentation

## 2023-03-29 DIAGNOSIS — R1084 Generalized abdominal pain: Secondary | ICD-10-CM | POA: Diagnosis not present

## 2023-03-29 DIAGNOSIS — R6889 Other general symptoms and signs: Secondary | ICD-10-CM | POA: Diagnosis not present

## 2023-03-29 DIAGNOSIS — R609 Edema, unspecified: Secondary | ICD-10-CM | POA: Diagnosis not present

## 2023-03-29 DIAGNOSIS — Z743 Need for continuous supervision: Secondary | ICD-10-CM | POA: Diagnosis not present

## 2023-03-29 DIAGNOSIS — R109 Unspecified abdominal pain: Secondary | ICD-10-CM | POA: Diagnosis present

## 2023-03-29 DIAGNOSIS — R1900 Intra-abdominal and pelvic swelling, mass and lump, unspecified site: Secondary | ICD-10-CM | POA: Diagnosis not present

## 2023-03-29 LAB — COMPREHENSIVE METABOLIC PANEL
ALT: 22 U/L (ref 0–44)
AST: 21 U/L (ref 15–41)
Albumin: 3.9 g/dL (ref 3.5–5.0)
Alkaline Phosphatase: 133 U/L — ABNORMAL HIGH (ref 38–126)
Anion gap: 8 (ref 5–15)
BUN: 39 mg/dL — ABNORMAL HIGH (ref 8–23)
CO2: 26 mmol/L (ref 22–32)
Calcium: 9.1 mg/dL (ref 8.9–10.3)
Chloride: 104 mmol/L (ref 98–111)
Creatinine, Ser: 0.87 mg/dL (ref 0.44–1.00)
GFR, Estimated: >60 mL/min (ref >60)
Glucose, Bld: 272 mg/dL — ABNORMAL HIGH (ref 70–99)
Potassium: 3.7 mmol/L (ref 3.5–5.1)
Sodium: 138 mmol/L (ref 135–145)
Total Bilirubin: 0.7 mg/dL (ref 0.3–1.2)
Total Protein: 6.2 g/dL — ABNORMAL LOW (ref 6.5–8.1)

## 2023-03-29 LAB — CBC WITH DIFFERENTIAL/PLATELET
Abs Immature Granulocytes: 0.04 10*3/uL (ref 0.00–0.07)
Basophils Absolute: 0 10*3/uL (ref 0.0–0.1)
Basophils Relative: 1 %
Eosinophils Absolute: 0.3 10*3/uL (ref 0.0–0.5)
Eosinophils Relative: 7 %
HCT: 36.4 % (ref 36.0–46.0)
Hemoglobin: 11.3 g/dL — ABNORMAL LOW (ref 12.0–15.0)
Immature Granulocytes: 1 %
Lymphocytes Relative: 40 %
Lymphs Abs: 1.6 10*3/uL (ref 0.7–4.0)
MCH: 25.4 pg — ABNORMAL LOW (ref 26.0–34.0)
MCHC: 31 g/dL (ref 30.0–36.0)
MCV: 81.8 fL (ref 80.0–100.0)
Monocytes Absolute: 0.3 10*3/uL (ref 0.1–1.0)
Monocytes Relative: 8 %
Neutro Abs: 1.8 10*3/uL (ref 1.7–7.7)
Neutrophils Relative %: 43 %
Platelets: 166 10*3/uL (ref 150–400)
RBC: 4.45 MIL/uL (ref 3.87–5.11)
RDW: 16 % — ABNORMAL HIGH (ref 11.5–15.5)
WBC: 4 10*3/uL (ref 4.0–10.5)
nRBC: 0 % (ref 0.0–0.2)

## 2023-03-29 MED ORDER — ONDANSETRON HCL 4 MG/2ML IJ SOLN
4.0000 mg | Freq: Once | INTRAMUSCULAR | Status: AC
  Administered 2023-03-29: 4 mg via INTRAVENOUS
  Filled 2023-03-29: qty 2

## 2023-03-29 MED ORDER — IOHEXOL 300 MG/ML  SOLN
100.0000 mL | Freq: Once | INTRAMUSCULAR | Status: AC | PRN
  Administered 2023-03-29: 100 mL via INTRAVENOUS

## 2023-03-29 MED ORDER — IOHEXOL 12 MG/ML PO SOLN
500.0000 mL | ORAL | Status: AC
  Administered 2023-03-29 (×2): 500 mL via ORAL

## 2023-03-29 MED ORDER — MORPHINE SULFATE (PF) 4 MG/ML IV SOLN
4.0000 mg | Freq: Once | INTRAVENOUS | Status: AC
  Administered 2023-03-29: 4 mg via INTRAVENOUS
  Filled 2023-03-29: qty 1

## 2023-03-29 MED ORDER — LACTATED RINGERS IV BOLUS
1000.0000 mL | Freq: Once | INTRAVENOUS | Status: AC
  Administered 2023-03-29: 1000 mL via INTRAVENOUS

## 2023-03-29 NOTE — ED Notes (Signed)
Patient provided fluids

## 2023-03-29 NOTE — ED Notes (Signed)
Patient provided box meal with diet cola.

## 2023-03-29 NOTE — ED Notes (Addendum)
6L [place] placed r/t pneumothorax findings. Humidity added.

## 2023-03-29 NOTE — ED Notes (Signed)
[facility] called to inform of discharge and enquire on transportation back home. Awaiting callback for further instructions.

## 2023-03-29 NOTE — ED Provider Notes (Signed)
[facility] Provider Note    Event Date/Time   First MD Initiated Contact with Patient 03/29/23 1251     (approximate)   History   Abdominal Pain   HPI  Natasha [name] is a 77 y.o. 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  Patient tells me that since leaving the hospital about a month ago she has had minimal drainage from her mucous fistula.  The ileostomy however is draining.  She is on increasing abdominal swelling.  However she denies abdominal pain.  Says she is eating and drinking well and denies fevers or chills or urinary symptoms.     Past Medical History:  Diagnosis Date   Anemia    Cancer (HCC)    lymphoma-stomach    Cancer (HCC)    leulemia   CHF (congestive heart failure) (HCC)    COPD (chronic obstructive pulmonary disease) (HCC)    Diabetes mellitus without complication (HCC)    Hypotension    Pleural effusion    [facility] ,  2 weeks ago   Vaginal delivery    x 5    Patient Active Problem List   Diagnosis Date Noted   Generalized abdominal pain 03/29/2023   Cellulitis, abdominal wall 03/04/2023   Peristomal dermatitis and cellulitis 03/04/2023   Ileostomy status (HCC) 03/04/2023   Chronic anticoagulation 03/04/2023   History of GI bleed 03/04/2023   Palliative care encounter 01/12/2023   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   Urinary retention 01/07/2023   Effusion of left knee 01/07/2023   Urinary tract infection with hematuria 01/07/2023   Pancytopenia (HCC) 12/17/2022   Left leg cellulitis 12/14/2022   Acute GI bleeding 11/12/2022   Acute blood loss anemia 11/11/2022   Hemorrhagic shock (HCC) 11/07/2022   Overweight (BMI 25.0-29.9) 10/21/2022   AKI (acute kidney injury) (HCC) 10/21/2022   Anemia associated with chemotherapy  10/21/2022   Influenza A with pneumonia 10/20/2022   Neutropenic fever (HCC) 10/20/2022   Myocardial injury 10/20/2022   Type II diabetes mellitus with renal manifestations (HCC) 10/20/2022   Iron deficiency anemia 09/19/2022   COPD with acute exacerbation (HCC) 08/29/2022   Type 2 diabetes mellitus without complication, with long-term current use of insulin (HCC) 08/29/2022   Normocytic anemia 08/29/2022   Severe sepsis (HCC) 08/29/2022   Chronic respiratory failure with hypoxia (HCC) 08/16/2022   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   Swelling of lower extremity 07/24/2022   Hypotension 07/24/2022   Goals of care, counseling/discussion    COVID-19 virus infection 07/14/2022   Anemia of chronic disease 07/14/2022   Thrombocytopenia (HCC) 07/14/2022   Hypothyroidism, unspecified 07/14/2022   Chronic kidney disease, stage 3a (HCC) 07/14/2022   Bilateral pleural effusion 07/12/2022   Diabetes mellitus, type II (HCC) 07/12/2022   Acute on chronic respiratory failure with hypoxia and hypercarbia (HCC) 07/12/2022   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/12/2022   Lower extremity edema 05/12/2022   Other pancytopenia (HCC) 04/16/2022   Type 2 diabetes mellitus with proteinuria (HCC) 04/09/2022   Atherosclerosis of aorta (HCC) 04/09/2022   Chronic obstructive pulmonary disease (COPD) (HCC) 04/09/2022   SOB (shortness of breath) on exertion 04/09/2022   Acute cough 04/09/2022   Post herpetic neuralgia 03/14/2020   Gall stones 10/23/2019   Lymphoma (HCC) 10/23/2019   Generalized lymphadenopathy 09/08/2018  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   Primary osteoarthritis of both knees 07/07/2018   Senile purpura (HCC) 07/07/2018     Physical Exam  Triage Vital Signs: ED Triage Vitals [03/29/23 1248]  Enc Vitals Group     BP 106/69     Pulse Rate 77     Resp 16     Temp      Temp src      SpO2 100 %     Weight 130 lb 1.1 oz (59 kg)     Height 5\' 5"   (1.651 m)     Head Circumference      Peak Flow      Pain Score 0     Pain Loc      Pain Edu?      Excl. in GC?     Most recent vital signs: Vitals:   03/29/23 2134 03/30/23 0000  BP: 122/63 109/69  Pulse: 70 68  Resp: 18 16  Temp:    SpO2: 100% 100%     General: Awake, no distress.  CV:  Good peripheral perfusion.  Resp:  Normal effort.  Abd:  No distention.  Mucous fistula in the left upper abdomen with minimal drainage in the bag, there is tenderness to palpation in the surrounding abdomen, right ileostomy is draining brown stool Neuro:             Awake, Alert, Oriented x 3  Other:     ED Results / Procedures / Treatments  Labs (all labs ordered are listed, but only abnormal results are displayed) Labs Reviewed  COMPREHENSIVE METABOLIC PANEL - Abnormal; Notable for the following components:      Result Value   Glucose, Bld 272 (*)    BUN 39 (*)    Total Protein 6.2 (*)    Alkaline Phosphatase 133 (*)    All other components within normal limits  CBC WITH DIFFERENTIAL/PLATELET - Abnormal; Notable for the following components:   Hemoglobin 11.3 (*)    MCH 25.4 (*)    RDW 16.0 (*)    All other components within normal limits     EKG     RADIOLOGY Pending at signout   PROCEDURES:  Critical Care performed: No  Procedures   MEDICATIONS ORDERED IN ED: Medications  lactated ringers bolus 1,000 mL (0 mLs Intravenous Stopped 03/29/23 1500)  iohexol (OMNIPAQUE) 12 MG/ML oral solution 500 mL (500 mLs Oral Contrast Given 03/29/23 1710)  iohexol (OMNIPAQUE) 300 MG/ML solution 100 mL (100 mLs Intravenous Contrast Given 03/29/23 1630)  morphine (PF) 4 MG/ML injection 4 mg (4 mg Intravenous Given 03/29/23 1735)  ondansetron (ZOFRAN) injection 4 mg (4 mg Intravenous Given 03/29/23 1736)     IMPRESSION / MDM / ASSESSMENT AND PLAN / ED COURSE  I reviewed the triage vital signs and the nursing notes.                              Patient's presentation is most  consistent with acute complicated illness / injury requiring diagnostic workup.  Differential diagnosis includes, but is not limited to, bowel obstruction, abscess,  Postsurgical complication is a 77 year old female with multiple medical comorbidities currently on hospice presents because of abdominal swelling.  She underwent colectomy with end ileostomy and creation of mucous fistula 3 months ago.  Has had recurrent visits for abdominal wall cellulitis.  Was seen in the ED 5 days ago for abdominal swelling and  pain and had a normal CT.  Presents again today because of increased swelling of the abdomen.  Patient denies pain but she is tender diffusely throughout her abdomen.  She is eating and drinking denies vomiting.  There is documentation from her home hospice that she has not had output from her ostomy and her abdominal girth is increasing.  Documentation says that she would like to see Dr. Vickii Chafe and that his office had communicated to them that she may need MRI and direct admission.  Patient's mucous fistula is what is not draining and she later tells me that she really has not had much drainage from this since she left the hospital.  Has had drainage from the ileostomy.  Patient's abdomen is mildly swollen and it is tender with voluntary guarding.  I did review patient's CT from several days ago which did not show any acute findings.  I also consulted with Dr. Everlene Farrier via secure chat who recommends getting a CT with p.o. contrast as well.  Patient was seen by Dr. Everlene Farrier who feels that there is low likelihood of intra-abdominal pathology but recommends going ahead with CT given hospice is concerned.  Signed out to oncoming provider, dispo pending CT results.       FINAL CLINICAL IMPRESSION(S) / ED DIAGNOSES   Final diagnoses:  Generalized abdominal pain  Primary spontaneous pneumothorax     Rx / DC Orders   ED Discharge Orders     None        Note:  This document was prepared  using Dragon voice recognition software and may include unintentional dictation errors.   Georga Hacking, MD 03/30/23 [phone]

## 2023-03-29 NOTE — ED Notes (Signed)
RLQ Ostomy bag removed, skin cleaned and redness noted around the stoma. Skin barrier applied, barrier ring, and then wafer barrier followed by bag. No leaking noted. CT notified that the patient is ready for diagnostic study.

## 2023-03-29 NOTE — ED Triage Notes (Signed)
Pt arrives via [facility] with postop complications. Pt had a colostomy and ileostomy done a month ago. Pt's ileostomy bag has not drained in 24 hrs. Family also states the contents of her colostomy bag is unual in color and consistency. Pt also states her abd has become swollen in the past few days. Pt denies pain.

## 2023-03-29 NOTE — ED Notes (Signed)
Patient to CT.

## 2023-03-29 NOTE — Consult Note (Addendum)
Patient ID: Natasha [name], female   DOB: June 17, 1946, 77 y.o.   MRN: [id number]  HPI Natasha [name] is a 77 y.o. 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 Multiple readmission and falls within the last 6 months  She has had multiple hospitalizations for acute hypoxic respiratory failure, GI bleeds, fall,  bacterial pneumonia and influenza pneumonia in the past I am seen her in consultation at the request of Dr Titus Dubin  She came in from her home hospice since nursing staff was concerned that her mucuous fistula  not putting out stool, she does have good output from ileostomy. Pt does have some cognitive deficits, she is DNR and on hospice home. She is tolerating PO but had some behavioral issues, leaving the ostomy bag uncovered. She did have a recent UTI and cellulitis of abdominal wall.Pt denies pain . Taking PO She endorses abd pain that is chronic intermittent and dull.  I have reviewed CT from 5 days ago and is completely nml, no obstruction, no abscess . S HPI  Past Medical History:  Diagnosis Date   Anemia    Cancer (HCC)    lymphoma-stomach    Cancer (HCC)    leulemia   CHF (congestive heart failure) (HCC)    COPD (chronic obstructive pulmonary disease) (HCC)    Diabetes mellitus without complication (HCC)    Hypotension    Pleural effusion    [facility] ,  2 weeks ago   Vaginal delivery    x 5    Past Surgical History:  Procedure Laterality Date   INCISION AND DRAINAGE OF WOUND Left 01/07/2023   Procedure: ARTHROSCOPIC LEFT KNEE WASHOUT;  Surgeon: Christena Flake, MD;  Location: [facility] ORS;  Service: Orthopedics;  Laterality: Left;   IR IMAGING GUIDED PORT INSERTION  08/16/2022   LAPAROTOMY N/A 01/09/2023   Procedure: EXPLORATORY LAPAROTOMY AND RIGHT COLECTOMY;  Surgeon: Leafy Ro, MD;   Location: [facility] ORS;  Service: General;  Laterality: N/A;   TONSILLECTOMY Bilateral    as a child    Family History  Problem Relation Age of Onset   Cancer Mother    Breast cancer Mother    Cancer Father    Alzheimer's disease Father    Lung cancer Father        lung   Varicose Veins Brother    Heart attack Brother     Social History Social History   Tobacco Use   Smoking status: Former    Packs/day: 2.00    Years: 55.00    Additional pack years: 0.00    Total pack years: 110.00    Types: Cigarettes    Start date: 07/07/1961    Quit date: 08/02/2022    Years since quitting: 0.6   Smokeless tobacco: Never   Tobacco comments:    1/2 pack she states but family says 2 PPD  Vaping Use   Vaping Use: Never used  Substance Use Topics   Alcohol use: No   Drug use: Never    No Known Allergies  Current Facility-Administered Medications  Medication Dose Route Frequency Provider Last Rate Last Admin   iohexol (OMNIPAQUE) 12 MG/ML oral solution 500 mL  500 mL Oral Q1H Georga Hacking, MD   500 mL at 03/29/23 1345   Current Outpatient Medications  Medication Sig Dispense  Refill   Accu-Chek Softclix Lancets lancets 1 each 3 (three) times daily.     acetaminophen (TYLENOL) 500 MG tablet Take 1,000 mg by mouth 4 (four) times daily as needed.     albuterol (VENTOLIN HFA) 108 (90 Base) MCG/ACT inhaler TAKE 2 PUFFS BY MOUTH EVERY 6 HOURS AS NEEDED FOR WHEEZE OR SHORTNESS OF BREATH 18 g 1   apixaban (ELIQUIS) 2.5 MG TABS tablet Take 1 tablet (2.5 mg total) by mouth 2 (two) times daily. 60 tablet 5   Blood Glucose Monitoring Suppl DEVI 1 each by Does not apply route in the morning, at noon, and at bedtime. May substitute to any manufacturer covered by patient's insurance. 1 each 0   dronabinol (MARINOL) 5 MG capsule Take 5 mg by mouth 4 (four) times daily as needed.     furosemide (LASIX) 20 MG tablet Take 1 tablet (20 mg total) by mouth daily as needed for fluid or edema. 90 tablet 3    insulin NPH-regular Human (70-30) 100 UNIT/ML injection Inject 4 Units into the skin 2 (two) times daily with a meal. Use only for hyperglycemia, CBG+ 300. Start with 5 units, titrate up to max 10 unit per dose if need. (Patient not taking: Reported on 03/04/2023) 10 mL 2   ipratropium-albuterol (DUONEB) 0.5-2.5 (3) MG/3ML SOLN Take 3 mLs by nebulization every 6 (six) hours as needed (shortness of breath or wheezing). 360 mL 0   levothyroxine (SYNTHROID) 50 MCG tablet Take 1 tablet (50 mcg total) by mouth daily at 6 (six) AM. 90 tablet 1   nystatin (MYCOSTATIN/NYSTOP) powder Apply 1 Application topically 3 (three) times daily. 15 g 0   oxyCODONE (OXY IR/ROXICODONE) 5 MG immediate release tablet Take 5 mg by mouth every 4 (four) hours as needed.     pantoprazole (PROTONIX) 40 MG tablet Take 1 tablet (40 mg total) by mouth daily. 30 tablet 5   traMADol (ULTRAM) 50 MG tablet Take 1 tablet (50 mg total) by mouth every 6 (six) hours as needed for moderate pain or severe pain. 30 tablet 0     Review of Systems Full ROS  was asked and was negative except for the information on the HPI  Physical Exam Blood pressure 125/72, pulse 74, temperature 98.2 F (36.8 C), temperature source Oral, resp. rate 14, height 5\' 5"  (1.651 m), weight 59 kg, SpO2 100 %. CONSTITUTIONAL: NAD. EYES: Pupils are equal, round,  Sclera are non-icteric. EARS, NOSE, MOUTH AND THROAT: The oropharynx is clear. The oral mucosa is pink and moist. Hearing is intact to voice. LYMPH NODES:  Lymph nodes in the neck are normal. RESPIRATORY:  Lungs are clear. There is normal respiratory effort, with equal breath sounds bilaterally, and without pathologic use of accessory muscles. CARDIOVASCULAR: Heart is regular without murmurs, gallops, or rubs. GI: The abdomen is  soft, End ileostomy on the right patent and pink w good output. MF on the left pink and patent w/o output. No peritonitis. Mild tenderness diffusely  GU: Rectal deferred.    MUSCULOSKELETAL: Normal muscle strength and tone. No cyanosis or edema.   SKIN: Turgor is good and there are no pathologic skin lesions or ulcers. NEUROLOGIC: Motor and sensation is grossly normal. Cranial nerves are grossly intact. PSYCH:  Oriented to person, place and time. Affect is normal.  Data Reviewed  I have personally reviewed the patient's imaging, laboratory findings and medical records.    Assessment/Plan Abd pain chronic. No evidence of surgical complications. SHe is frustrated since she  has to wear a bag and had issues with leaking. I do think there is cognitive deficits as well that imp[air her ability to care for the stoma. She wanted me to do a reversal of her ileostomy. I was very honest with her she is a very high risk for surgical complication with her COPD, lymphoma, DM and now failure to thrive and overall decline in her health. I personally think the risks of ileostomy reversal are higher than the benefits. She was frustrated. She can be seen in a tertiary center for second opinion regarding reversal.I do not think she understands fully the situation I DO THINK GOALS OF CARE NEED TO BE ADDRESSED AND HER DAUGHTER NEEDS TO BE INCLUDED. I DO THINK THERE IS A SIGNIFICANT COGNITIVE DEFICITS THAT IS A SIGNIFICANT BARRIER  I spent greater than 75 min in this encounter including per reviewing images, records, counseling the pt and performing appropriate documentation.  Sterling Big, MD FACS General Surgeon 03/29/2023, 2:06 PM

## 2023-03-29 NOTE — Telephone Encounter (Signed)
Dr. Everlene Farrier, received call back from [place] with [facility] coordinator.  Patient was scheduled for follow up in office for 03/30/23, was being worked in as she is having a lot of abdominal pain with swelling and has not been draining now for almost two days.  Patient had exploratory lap and right colectomy done March 2024.  Per [place], the hospice nurse and social worker felt it would be in the best interest for patient to be transported via EMS to the hospital as they are just really concerned with her symptoms.  They are in the process of coordinating this now for patient to go to the hospital now and wanted you to know as they thought maybe you can consult with her there.  Thank you.

## 2023-03-29 NOTE — ED Provider Notes (Signed)
Patient care assumed at 3 PM.  Briefly, 77 year old female here with concern for abdominal distention.  Patient has an extensive history and is currently on hospice for cancer, COPD, CHF.  She has no evidence of acute abnormality on her CT imaging.  Incidentally, however, she does appear to have a small right-sided pneumothorax.  This was not present on CT scan just several days ago.  Regarding her abdominal discomfort, no apparent indication for admission at this time.  This is a well-known issue.  Regarding her pneumothorax, this is small on CT but will obtain a chest x-ray.  I had a long discussion with the patient as well as her care power of attorney, and both feel strongly that she would not desire a chest tube which I think is reasonable, especially considering her hemodynamic instability and incidental note of this.  I do, however, think it is reasonable to treat with supplemental oxygen, repeat an x-ray to prove it stability, and treat supportively.   Repeat XR shows no PTX. Pt remains HDS. Pain controlled. D/c to Hospice.   Shaune Pollack, MD 03/30/23 [phone]

## 2023-03-29 NOTE — Discharge Instructions (Addendum)
CT scan today looked overall reassuring from an abdominal perspective  Your CT scan did show a small amount of air outside your lung, however. This is stable on repeat imaging.   Continue your usual treatment for COPD and wear oxygen at all times.

## 2023-03-30 ENCOUNTER — Ambulatory Visit: Payer: 59 | Admitting: Surgery

## 2023-03-30 DIAGNOSIS — R140 Abdominal distension (gaseous): Secondary | ICD-10-CM | POA: Diagnosis not present

## 2023-03-30 DIAGNOSIS — R279 Unspecified lack of coordination: Secondary | ICD-10-CM | POA: Diagnosis not present

## 2023-03-30 DIAGNOSIS — Z743 Need for continuous supervision: Secondary | ICD-10-CM | POA: Diagnosis not present

## 2023-03-30 NOTE — ED Notes (Signed)
[facility] callback from nurse Nedra Hai who reports pt would go back by non-emergent EMS transport and hospice nurse will meet pt at her home. Writer to provide callback to Hospice on-call RN Nedra Hai once transport departs.

## 2023-03-31 ENCOUNTER — Encounter: Payer: Self-pay | Admitting: Oncology

## 2023-04-05 ENCOUNTER — Telehealth: Payer: Self-pay

## 2023-04-05 NOTE — Telephone Encounter (Signed)
Transition Care Management Unsuccessful Follow-up Telephone Call  Date of discharge and from where:  03/30/2023 [facility]  Attempts:  1st Attempt  Reason for unsuccessful TCM follow-up call:  No answer/busy  Lionardo Haze [facility]  [facility] Population Health Community Resource Care Guide   [email or web address]  ?? [phone]   Website: [email or web address]  [email or web address]

## 2023-04-06 ENCOUNTER — Telehealth: Payer: Self-pay

## 2023-04-06 NOTE — Telephone Encounter (Signed)
Transition Care Management Unsuccessful Follow-up Telephone Call  Date of discharge and from where:  03/30/2023 [facility]  Attempts:  2nd Attempt  Reason for unsuccessful TCM follow-up call:  Left voice message  Kaydince Towles [facility]  [facility] Population Health Community Resource Care Guide   [email or web address]  ?? [phone]   Website: [email or web address]  [email or web address]

## 2023-04-07 ENCOUNTER — Ambulatory Visit: Payer: Medicaid Other | Admitting: Nurse Practitioner

## 2023-06-19 ENCOUNTER — Other Ambulatory Visit: Payer: Self-pay | Admitting: Family

## 2023-07-13 ENCOUNTER — Other Ambulatory Visit: Payer: Self-pay

## 2023-07-13 MED ORDER — PANTOPRAZOLE SODIUM 40 MG PO TBEC: 40.0000 mg | DELAYED_RELEASE_TABLET | Freq: Every day | ORAL | 5 refills | Status: AC

## 2023-08-16 ENCOUNTER — Other Ambulatory Visit: Payer: Self-pay | Admitting: Family

## 2024-01-27 ENCOUNTER — Encounter: Payer: Self-pay | Admitting: Oncology

## 2024-02-07 DIAGNOSIS — R519 Headache, unspecified: Secondary | ICD-10-CM | POA: Diagnosis not present

## 2024-02-07 DIAGNOSIS — J449 Chronic obstructive pulmonary disease, unspecified: Secondary | ICD-10-CM | POA: Diagnosis not present

## 2024-02-07 DIAGNOSIS — R161 Splenomegaly, not elsewhere classified: Secondary | ICD-10-CM | POA: Diagnosis not present

## 2024-02-07 DIAGNOSIS — R590 Localized enlarged lymph nodes: Secondary | ICD-10-CM | POA: Diagnosis not present

## 2024-02-07 DIAGNOSIS — Z1321 Encounter for screening for nutritional disorder: Secondary | ICD-10-CM | POA: Diagnosis not present

## 2024-02-07 DIAGNOSIS — M542 Cervicalgia: Secondary | ICD-10-CM | POA: Diagnosis not present

## 2024-02-07 DIAGNOSIS — R1900 Intra-abdominal and pelvic swelling, mass and lump, unspecified site: Secondary | ICD-10-CM | POA: Diagnosis not present

## 2024-02-07 DIAGNOSIS — R1084 Generalized abdominal pain: Secondary | ICD-10-CM | POA: Diagnosis not present

## 2024-02-07 DIAGNOSIS — E119 Type 2 diabetes mellitus without complications: Secondary | ICD-10-CM | POA: Diagnosis not present

## 2024-02-08 DIAGNOSIS — R590 Localized enlarged lymph nodes: Secondary | ICD-10-CM | POA: Diagnosis not present

## 2024-02-08 DIAGNOSIS — R161 Splenomegaly, not elsewhere classified: Secondary | ICD-10-CM | POA: Diagnosis not present

## 2024-02-08 DIAGNOSIS — R519 Headache, unspecified: Secondary | ICD-10-CM | POA: Diagnosis not present

## 2024-02-09 ENCOUNTER — Telehealth: Payer: Self-pay

## 2024-02-09 NOTE — Transitions of Care (Post Inpatient/ED Visit) (Signed)
   02/09/2024  Name: Natasha [name] MRN: [id number] DOB: 1946/08/10  Today's TOC FU Call Status: Today's TOC FU Call Status:: Unsuccessful Call (1st Attempt) Unsuccessful Call (1st Attempt) Date: 02/09/24  Attempted to reach the patient regarding the most recent Inpatient/ED visit.  Follow Up Plan: Additional outreach attempts will be made to reach the patient to complete the Transitions of Care (Post Inpatient/ED visit) call.   Signature Darrall Ellison, LPN [facility] Nurse Health Advisor Direct Dial [phone]

## 2024-02-10 NOTE — Transitions of Care (Post Inpatient/ED Visit) (Signed)
   02/10/2024  Name: Natasha [name] MRN: [id number] DOB: 08/24/1946  Today's TOC FU Call Status: Today's TOC FU Call Status:: Unsuccessful Call (2nd Attempt) Unsuccessful Call (1st Attempt) Date: 02/09/24 Unsuccessful Call (2nd Attempt) Date: 02/10/24  Attempted to reach the patient regarding the most recent Inpatient/ED visit.  Follow Up Plan: Additional outreach attempts will be made to reach the patient to complete the Transitions of Care (Post Inpatient/ED visit) call.   Signature Julian Lemmings, LPN [facility] Nurse Health Advisor Direct Dial [phone]

## 2024-02-14 NOTE — Transitions of Care (Post Inpatient/ED Visit) (Signed)
   02/14/2024  Name: Natasha [name] MRN: [id number] DOB: 30-Mar-1946  Today's TOC FU Call Status: Today's TOC FU Call Status:: Unsuccessful Call (3rd Attempt) Unsuccessful Call (1st Attempt) Date: 02/09/24 Unsuccessful Call (2nd Attempt) Date: 02/10/24 Unsuccessful Call (3rd Attempt) Date: 02/14/24  Attempted to reach the patient regarding the most recent Inpatient/ED visit.  Follow Up Plan: No further outreach attempts will be made at this time. We have been unable to contact the patient.  Signature Darrall Ellison, LPN [facility] Nurse Health Advisor Direct Dial [phone]

## 2024-02-22 ENCOUNTER — Encounter: Payer: Self-pay | Admitting: Oncology

## 2024-05-18 ENCOUNTER — Encounter: Payer: Self-pay | Admitting: Oncology

## 2024-08-10 ENCOUNTER — Encounter: Payer: Self-pay | Admitting: Oncology

## 2024-08-16 ENCOUNTER — Ambulatory Visit: Admitting: Nurse Practitioner

## 2024-08-16 DIAGNOSIS — Z91199 Patient's noncompliance with other medical treatment and regimen due to unspecified reason: Secondary | ICD-10-CM

## 2024-08-23 NOTE — Progress Notes (Signed)
Patient did not show for the scheduled appointment.

## 2024-09-07 ENCOUNTER — Telehealth: Payer: Self-pay

## 2024-09-07 NOTE — Telephone Encounter (Signed)
 Copied from CRM [id number]. Topic: General - Other >> Sep 07, 2024  3:48 PM Hadassah PARAS wrote: Reason for CRM: Pt was calling to set up an appointment to establish care at [facility], but currently has a pcp at [facility]. Pt stated she needed to see a dr to check her lungs and stomach before a procedure. Hernia also hurts when moving. When going over dr info pt was disconnected >> Sep 07, 2024  3:58 PM Avram MATSU wrote: Patient is needing an appt for pre op clarence, she does not know when her surgery is but was told her lungs needed to be checked.Please call to schedule an appt  [phone]

## 2024-09-07 NOTE — Telephone Encounter (Signed)
 Copied from CRM [id number]. Topic: General - Other >> Sep 07, 2024  3:48 PM Hadassah PARAS wrote: Reason for CRM: Pt was calling to set up an appointment to establish care at [facility], but currently has a pcp at [facility]. Pt stated she needed to see a dr to check her lungs and stomach before a procedure. Hernia also hurts when moving. When going over dr info pt was disconnected >> Sep 07, 2024  3:58 PM Avram MATSU wrote: Patient is needing an appt for pre op clarence, she does not know when her surgery is but was told her lungs needed to be checked.Please call to schedule an appt  [phone]  I called [phone] and left a voicemail requesting they return our call.  I also sent a message to patient via MyChart.  E2C2 - when patient calls back, please ask patient if she would like to speak with a triage nurse regarding her hernia, which she states hurts when she is moving.  Please also ask patient if she would like to have her pre-op clearance appointment with Chelsea Aurora, NP, or if she would like to wait and have that appointment with [facility] after she establishes with them.

## 2024-09-08 NOTE — Telephone Encounter (Signed)
 I have last seen patient on 09/10/2022 at [facility].  She had no-showed  on 08/16/2024.  I am okay either way, if pt wants to establish care at [facility] family practice.

## 2024-09-10 ENCOUNTER — Ambulatory Visit (INDEPENDENT_AMBULATORY_CARE_PROVIDER_SITE_OTHER): Admitting: Podiatry

## 2024-09-10 DIAGNOSIS — Z794 Long term (current) use of insulin: Secondary | ICD-10-CM

## 2024-09-10 DIAGNOSIS — B351 Tinea unguium: Secondary | ICD-10-CM | POA: Diagnosis not present

## 2024-09-10 DIAGNOSIS — M79674 Pain in right toe(s): Secondary | ICD-10-CM

## 2024-09-10 DIAGNOSIS — M2041 Other hammer toe(s) (acquired), right foot: Secondary | ICD-10-CM

## 2024-09-10 DIAGNOSIS — L84 Corns and callosities: Secondary | ICD-10-CM

## 2024-09-10 DIAGNOSIS — M2042 Other hammer toe(s) (acquired), left foot: Secondary | ICD-10-CM

## 2024-09-10 DIAGNOSIS — M79675 Pain in left toe(s): Secondary | ICD-10-CM | POA: Diagnosis not present

## 2024-09-10 DIAGNOSIS — M2012 Hallux valgus (acquired), left foot: Secondary | ICD-10-CM

## 2024-09-10 DIAGNOSIS — E119 Type 2 diabetes mellitus without complications: Secondary | ICD-10-CM

## 2024-09-10 DIAGNOSIS — Z0189 Encounter for other specified special examinations: Secondary | ICD-10-CM

## 2024-09-10 DIAGNOSIS — K9429 Other complications of gastrostomy: Secondary | ICD-10-CM | POA: Insufficient documentation

## 2024-09-10 DIAGNOSIS — Z933 Colostomy status: Secondary | ICD-10-CM | POA: Insufficient documentation

## 2024-09-10 DIAGNOSIS — M2011 Hallux valgus (acquired), right foot: Secondary | ICD-10-CM | POA: Diagnosis not present

## 2024-09-10 DIAGNOSIS — M159 Polyosteoarthritis, unspecified: Secondary | ICD-10-CM | POA: Insufficient documentation

## 2024-09-10 DIAGNOSIS — C8510 Unspecified B-cell lymphoma, unspecified site: Secondary | ICD-10-CM | POA: Insufficient documentation

## 2024-09-10 DIAGNOSIS — E1169 Type 2 diabetes mellitus with other specified complication: Secondary | ICD-10-CM

## 2024-09-10 DIAGNOSIS — K435 Parastomal hernia without obstruction or  gangrene: Secondary | ICD-10-CM | POA: Insufficient documentation

## 2024-09-10 DIAGNOSIS — Z79891 Long term (current) use of opiate analgesic: Secondary | ICD-10-CM | POA: Insufficient documentation

## 2024-09-10 NOTE — Progress Notes (Signed)
 Subjective: Natasha [name] presents today for diabetic foot evaluation and callus(es) of both feet and painful mycotic toenails that are difficult to trim. Painful toenails interfere with ambulation. Aggravating factors include wearing enclosed shoe gear. Pain is relieved with periodic professional debridement. Painful calluses are aggravated when weightbearing with and without shoegear. Pain is relieved with periodic professional debridement..  Chief Complaint  Patient presents with   Diabetes    DFC. She is diabetic. A1c 7.6 NP Vincente is her PCP   Patient denies any h/o foot wounds.  PCP is Gretel App, NP.  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 09/10/2024   Other complications of gastrostomy (HCC) 09/10/2024   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   Generalized abdominal pain 03/29/2023   Cellulitis, abdominal wall 03/04/2023   Peristomal dermatitis and cellulitis 03/04/2023   Ileostomy status (HCC) 03/04/2023   Chronic anticoagulation 03/04/2023   History of GI bleed 03/04/2023   Palliative care encounter 01/12/2023   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   Urinary retention 01/07/2023   Effusion of left knee 01/07/2023   Urinary tract infection with hematuria 01/07/2023   Septic arthritis of knee, left (HCC) 01/07/2023   Pancytopenia (HCC) 12/17/2022   Left leg cellulitis 12/14/2022   Acute GI bleeding 11/12/2022   Acute blood loss  anemia 11/11/2022   Hemorrhagic shock (HCC) 11/07/2022   Overweight (BMI 25.0-29.9) 10/21/2022   AKI (acute kidney injury) 10/21/2022   Anemia associated with chemotherapy 10/21/2022   Influenza A with pneumonia 10/20/2022   Neutropenic fever 10/20/2022   Myocardial injury 10/20/2022   Type II diabetes mellitus with renal manifestations (HCC) 10/20/2022   Iron  deficiency anemia 09/19/2022   COPD with acute exacerbation (HCC) 08/29/2022   Type 2 diabetes mellitus without complication, with long-term current use of insulin  (HCC) 08/29/2022   Normocytic anemia 08/29/2022   Severe sepsis (HCC) 08/29/2022   Chronic respiratory failure with hypoxia (HCC) 08/16/2022   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   Swelling of lower extremity 07/24/2022   Hypotension 07/24/2022   Goals of care, counseling/discussion    COVID-19 virus infection 07/14/2022   Anemia of chronic disease 07/14/2022   Thrombocytopenia 07/14/2022   Hypothyroidism, unspecified 07/14/2022   Chronic kidney disease, stage 3a (HCC) 07/14/2022   Bilateral pleural effusion 07/12/2022   Diabetes mellitus, type II (HCC) 07/12/2022   Acute on chronic respiratory failure with hypoxia and hypercarbia (HCC) 07/12/2022   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/12/2022   Lower extremity edema 05/12/2022   Other pancytopenia (HCC) 04/16/2022   Type 2 diabetes mellitus with proteinuria (HCC) 04/09/2022   Atherosclerosis of aorta 04/09/2022   Chronic obstructive pulmonary disease (COPD) (HCC) 04/09/2022   SOB (shortness of breath) on exertion 04/09/2022   Acute cough 04/09/2022   Post herpetic neuralgia 03/14/2020   Gall stones 10/23/2019   Lymphoma (  HCC) 10/23/2019   Generalized lymphadenopathy 09/08/2018   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   Primary osteoarthritis of both knees 07/07/2018   Senile purpura 07/07/2018    Past Surgical History:  Procedure Laterality Date   INCISION AND DRAINAGE OF WOUND  Left 01/07/2023   Procedure: ARTHROSCOPIC LEFT KNEE WASHOUT;  Surgeon: Edie Norleen PARAS, MD;  Location: [facility]S;  Service: Orthopedics;  Laterality: Left;   IR IMAGING GUIDED PORT INSERTION  08/16/2022   LAPAROTOMY N/A 01/09/2023   Procedure: EXPLORATORY LAPAROTOMY AND RIGHT COLECTOMY;  Surgeon: Jordis Laneta FALCON, MD;  Location: [facility];  Service: General;  Laterality: N/A;   TONSILLECTOMY Bilateral    as a child    Current Outpatient Medications on File Prior to Visit  Medication Sig Dispense Refill   Accu-Chek Softclix Lancets lancets 1 each 3 (three) times daily.     acetaminophen  (TYLENOL ) 500 MG tablet Take 1,000 mg by mouth 4 (four) times daily as needed.     albuterol  (VENTOLIN  HFA) 108 (90 Base) MCG/ACT inhaler TAKE 2 PUFFS BY MOUTH EVERY 6 HOURS AS NEEDED FOR WHEEZE OR SHORTNESS OF BREATH 18 g 1   apixaban  (ELIQUIS ) 2.5 MG TABS tablet Take 1 tablet (2.5 mg total) by mouth 2 (two) times daily. 60 tablet 5   Blood Glucose Monitoring Suppl DEVI 1 each by Does not apply route in the morning, at noon, and at bedtime. May substitute to any manufacturer covered by patient's insurance. 1 each 0   dronabinol (MARINOL) 5 MG capsule Take 5 mg by mouth 4 (four) times daily as needed.     furosemide  (LASIX ) 20 MG tablet Take 1 tablet (20 mg total) by mouth daily as needed for fluid or edema. 90 tablet 3   insulin  NPH-regular Human (70-30) 100 UNIT/ML injection Inject 4 Units into the skin 2 (two) times daily with a meal. Use only for hyperglycemia, CBG+ 300. Start with 5 units, titrate up to max 10 unit per dose if need. (Patient not taking: Reported on 03/04/2023) 10 mL 2   ipratropium-albuterol  (DUONEB) 0.5-2.5 (3) MG/3ML SOLN Take 3 mLs by nebulization every 6 (six) hours as needed (shortness of breath or wheezing). 360 mL 0   levothyroxine  (SYNTHROID ) 50 MCG tablet Take 1 tablet (50 mcg total) by mouth daily at 6 (six) AM. 90 tablet 1   nystatin  (MYCOSTATIN /NYSTOP ) powder Apply 1 Application topically  3 (three) times daily. 15 g 0   oxyCODONE  (OXY IR/ROXICODONE ) 5 MG immediate release tablet Take 5 mg by mouth every 4 (four) hours as needed.     pantoprazole  (PROTONIX ) 40 MG tablet Take 1 tablet (40 mg total) by mouth daily. 30 tablet 5   potassium chloride  (KLOR-CON ) 10 MEQ tablet Take 10 mEq by mouth daily.     traMADol  (ULTRAM ) 50 MG tablet Take 1 tablet (50 mg total) by mouth every 6 (six) hours as needed for moderate pain or severe pain. 30 tablet 0   No current facility-administered medications on file prior to visit.     No Known Allergies  Social History   Occupational History   Occupation: unemployed  Tobacco Use   Smoking status: Former    Current packs/day: 0.00    Average packs/day: 2.0 packs/day for 61.1 years (122.1 ttl pk-yrs)    Types: Cigarettes    Start date: 07/07/1961    Quit date: 08/02/2022    Years since quitting: 2.1   Smokeless tobacco: Never   Tobacco comments:    1/2 pack  she states but family says 2 PPD  Vaping Use   Vaping status: Never Used  Substance and Sexual Activity   Alcohol use: No   Drug use: Never   Sexual activity: Not Currently    Partners: Male    Birth control/protection: Post-menopausal    Family History  Problem Relation Age of Onset   Cancer Mother    Breast cancer Mother    Cancer Father    Alzheimer's disease Father    Lung cancer Father        lung   Varicose Veins Brother    Heart attack Brother     Immunization History  Administered Date(s) Administered   Fluad Quad(high Dose 65+) 07/16/2022   PNEUMOCOCCAL CONJUGATE-20 07/16/2022   Tdap 07/06/2021    Objective: There were no vitals filed for this visit.  Natasha [name] is a pleasant 78 y.o. female WD, WN in NAD. AAO X 3.   Diabetic foot exam was performed with the following findings:   Vascular Examination: Capillary refill time immediate b/l. Faintly palpable pedal pulses. Pedal hair decreased b/l. Pedal edema absent. No pain with calf compression b/l.  Skin temperature gradient WNL b/l. No cyanosis or clubbing b/l. No ischemia or gangrene noted b/l.   Neurological Examination: Sensation grossly intact b/l with 10 gram monofilament. Vibratory sensation intact b/l.   Dermatological Examination: Pedal skin with normal turgor, texture and tone b/l.  No open wounds. No interdigital macerations.   Toenails 1-5 b/l thick, discolored, elongated with subungual debris and pain on dorsal palpation.   Hyperkeratotic lesion(s) submet head 1-5 b/l and sub 5th met base b/l.SABRA No erythema, no edema, no drainage, no fluctuance.  Musculoskeletal Examination: Muscle strength 5/5 to all lower extremity muscle groups bilaterally. HAV with bunion b/l. Hammertoe(s) 2-5 b/l.SABRA No pain, crepitus or joint limitation noted with ROM b/l LE.  Patient ambulates independently without assistive aids.  Radiographs: None      Lab Results  Component Value Date   HGBA1C 5.6 10/21/2022   Assessment: 1. Pain due to onychomycosis of toenails of both feet   2. Hallux valgus, acquired, bilateral   3. Acquired hammertoes of both feet   4. Callus   5. Type 2 diabetes mellitus with other specified complication, with long-term current use of insulin  (HCC)   6. Encounter for diabetic foot exam (HCC)     ADA Risk Categorization: Low Risk:  Patient has all of the following: Intact protective sensation No prior foot ulcer  No severe deformity Pedal pulses present  Plan: Orders Placed This Encounter  Procedures   For Home Use Only DME Diabetic Shoe    Dispense one pair extra depth shoes with 3 pair custom insoles. Offload calluses submet heads 1-5 b/l and sub 5th met base b/l. Length of need:12 months.  Diabetic foot examination performed today. All patient's and/or POA's questions/concerns addressed on today's visit. Toenails 1-5 b/l debrided in length and girth without incident. Callus(es) submet head 1 b/l, submet head 2 b/l, submet head 3 b/l, submet head 4 b/l,  submet head 5 b/l, sub 5th met base left foot, and sub 5th met base right foot pared with sharp debridement without incident. Continue daily foot inspections and monitor blood glucose per PCP/Endocrinologist's recommendations.Continue soft, supportive shoe gear daily. Report any pedal injuries to medical professional. Call office if there are any questions/concerns. -Rx to be sent to [facility] for one pair diabetic shoes and 3 pair custom insoles. To [facility] [address] [place], [place] [address] Phone: [phone] Fax: [phone]. -Patient/POA to call should there be question/concern in the interim.

## 2024-09-10 NOTE — Telephone Encounter (Signed)
 Source Mcclurg, Graylin Idell Graylin Idell (Patient)   Subject Golinski, Graylin Idell Graylin Idell (Patient)   Topic Appointments - Scheduling Inquiry for Clinic   Voided CRM from Changed Reason for Contact [phone]  Communication Reason for CRM: Pt was scheduled for a 12/23 appt but received a call to reschedule for 11/19 due to needing to see her sooner. Pt does not have transportation and cannot make it with less than 3 days notice to arrange. Rescheduled for 12/23, please confirm if this is soon enough and if not, reschedule with enough notice.      -----------------------------------------------------------------------  From previous Reason for Contact - Cancel/Reschedule:  Patient/patient representative is calling to cancel or reschedule an appointment. Refer to attachments for appointment information.

## 2024-09-10 NOTE — Telephone Encounter (Signed)
 Since patient saw Gretel 3/24 technically she is a patient in office we will need to see for acute visit for surgical clearance.Then patient can establish else where if patient would like. Counsel on no show policy if patient decides to stay. Any more no shows could lead to dismissal from practice. Please call and schedule patient.

## 2024-09-12 ENCOUNTER — Ambulatory Visit: Admitting: Nurse Practitioner

## 2024-09-16 ENCOUNTER — Encounter: Payer: Self-pay | Admitting: Podiatry

## 2024-09-18 ENCOUNTER — Telehealth: Payer: Self-pay

## 2024-09-18 NOTE — Telephone Encounter (Signed)
 Copied from CRM [id number]. Topic: General - Other >> Sep 18, 2024  1:08 PM [facility] wrote: Reason for CRM: [place] a nurse with [facility] stated a CT scan would be coming in and some gynecology follow ups would be needed with  someone locally due to patients transportation issues. Asberry is requesting a call back.     Asberry- [phone] opt [phone]

## 2024-09-19 NOTE — Telephone Encounter (Signed)
 Called [facility] back and spoke with a Burnard and I informed them that we have not seen pt since 01-05-2023 and that she has cancelled and no showed a lot of appts  She stated that they wanted PCP to place a referral to a gynecologist that is local to pt. Pt is scheduled on 10-16-24, I called pt ans asked if she planned on keeping this appt as an appt would be needed for any forms to be filled out and referrals to be placed [facility] forms placed in provider folder until pts appt

## 2024-10-09 ENCOUNTER — Encounter: Payer: Self-pay | Admitting: Oncology

## 2024-10-16 ENCOUNTER — Ambulatory Visit: Admitting: Nurse Practitioner

## 2024-11-02 ENCOUNTER — Ambulatory Visit: Payer: Self-pay

## 2024-11-02 ENCOUNTER — Other Ambulatory Visit: Payer: Self-pay

## 2024-11-02 ENCOUNTER — Telehealth: Payer: Self-pay | Admitting: Nurse Practitioner

## 2024-11-02 ENCOUNTER — Emergency Department

## 2024-11-02 ENCOUNTER — Emergency Department
Admission: EM | Admit: 2024-11-02 | Discharge: 2024-11-02 | Disposition: A | Discharge status: Home / Self Care | Attending: Emergency Medicine | Admitting: Emergency Medicine

## 2024-11-02 DIAGNOSIS — K435 Parastomal hernia without obstruction or  gangrene: Secondary | ICD-10-CM | POA: Diagnosis not present

## 2024-11-02 DIAGNOSIS — R109 Unspecified abdominal pain: Secondary | ICD-10-CM | POA: Diagnosis present

## 2024-11-02 LAB — CBC
HCT: 37.8 % (ref 36.0–46.0)
Hemoglobin: 12 g/dL (ref 12.0–15.0)
MCH: 28.8 pg (ref 26.0–34.0)
MCHC: 31.7 g/dL (ref 30.0–36.0)
MCV: 90.6 fL (ref 80.0–100.0)
Platelets: 174 K/uL (ref 150–400)
RBC: 4.17 MIL/uL (ref 3.87–5.11)
RDW: 13.7 % (ref 11.5–15.5)
WBC: 6.2 K/uL (ref 4.0–10.5)
nRBC: 0 % (ref 0.0–0.2)

## 2024-11-02 LAB — COMPREHENSIVE METABOLIC PANEL WITH GFR
ALT: 8 U/L (ref 0–44)
AST: 12 U/L — ABNORMAL LOW (ref 15–41)
Albumin: 3.9 g/dL (ref 3.5–5.0)
Alkaline Phosphatase: 165 U/L — ABNORMAL HIGH (ref 38–126)
Anion gap: 11 (ref 5–15)
BUN: 25 mg/dL — ABNORMAL HIGH (ref 8–23)
CO2: 23 mmol/L (ref 22–32)
Calcium: 8.9 mg/dL (ref 8.9–10.3)
Chloride: 107 mmol/L (ref 98–111)
Creatinine, Ser: 1.25 mg/dL — ABNORMAL HIGH (ref 0.44–1.00)
GFR, Estimated: 44 mL/min — ABNORMAL LOW
Glucose, Bld: 115 mg/dL — ABNORMAL HIGH (ref 70–99)
Potassium: 4.4 mmol/L (ref 3.5–5.1)
Sodium: 141 mmol/L (ref 135–145)
Total Bilirubin: 0.5 mg/dL (ref 0.0–1.2)
Total Protein: 5.8 g/dL — ABNORMAL LOW (ref 6.5–8.1)

## 2024-11-02 LAB — LIPASE, BLOOD: Lipase: 30 U/L (ref 11–51)

## 2024-11-02 LAB — LACTIC ACID, PLASMA: Lactic Acid, Venous: 0.7 mmol/L (ref 0.5–1.9)

## 2024-11-02 MED ORDER — SODIUM CHLORIDE 0.9 % IV BOLUS
1000.0000 mL | Freq: Once | INTRAVENOUS | Status: AC
  Administered 2024-11-02: 1000 mL via INTRAVENOUS

## 2024-11-02 MED ORDER — IOHEXOL 350 MG/ML SOLN
80.0000 mL | Freq: Once | INTRAVENOUS | Status: AC | PRN
  Administered 2024-11-02: 80 mL via INTRAVENOUS

## 2024-11-02 NOTE — Telephone Encounter (Signed)
 FYI Only or Action Required?: FYI only for provider: ED advised.  Patient was last seen in primary care on 01/05/2023 by Gretel App, NP.  Called Nurse Triage reporting Bleeding/Bruising and Abdominal Pain.  Symptoms began several days ago.  Interventions attempted: Nothing.  Symptoms are: gradually worsening.  Triage Disposition: Go to ED Now (or PCP Triage), Go to ED Now (Notify PCP)  Patient/caregiver understands and will follow disposition?: No, refuses disposition   Copied from CRM [id number]. Topic: Clinical - Red Word Triage >> Nov 02, 2024 11:44 AM [name] ORN wrote: Red Word that prompted transfer to Nurse Triage: has a hernia that is coming out of her skin and is bleeding Reason for Disposition  Hernia is painful or tender to touch  Patient sounds very sick or weak to the triager  Answer Assessment - Initial Assessment Questions Pt states she has a hernia on the left side of her ostomy that has been bleeding for about 4 days. She states she is having to pull blood clots out. Rn advised ER- she said no as they tried to kill her the last time. Rn gave rationale multiple times as to why pt needs to go to the ER. She states she just needs her lungs and cancer in her stomach checked so she can have surgery. RN again advised why she needs to go to the ER. Pt again refused.  Due to ED dispo, not all questions answered   1. LOCATION: Where does it hurt?      middle 2. RADIATION: Does the pain shoot anywhere else? (e.g., chest, back)      3. ONSET: When did the pain begin? (e.g., minutes, hours or days ago)       4. SUDDEN: Gradual or sudden onset?      5. PATTERN Does the pain come and go, or is it constant?      6. SEVERITY: How bad is the pain?  (e.g., Scale 1-10; mild, moderate, or severe)      7. RECURRENT SYMPTOM: Have you ever had this type of stomach pain before? If Yes, ask: When was the last time? and What happened that time?       8. AGGRAVATING  FACTORS: Does anything seem to cause this pain? (e.g., foods, stress, alcohol)     Eating, not eating, just hurts all the time.  9. CARDIAC SYMPTOMS: Do you have any of the following symptoms: chest pain, difficulty breathing, sweating, nausea?      10. OTHER SYMPTOMS: Do you have any other symptoms? (e.g., back pain, diarrhea, fever, urination pain, vomiting)       Blood coming out of hernia that is by her ostomy  Answer Assessment - Initial Assessment Questions 1. ONSET:  When did this first appear?      2. APPEARANCE: What does it look like?     bleeding 3. SIZE: How big is it? (e.g., inches, cm; or compare to coins, fruit)      4. LOCATION: Where exactly is the hernia located?     Left side of ostomy 5. PATTERN: Does the swelling come and go, or has it been constant since it started?      6. PAIN: Is there any pain? If Yes, ask: How bad is it?  (Scale 0-10; or none, mild, moderate, severe)      7. DIAGNOSIS: Have you been seen by a doctor (or NP/PA) for this? Did the doctor diagnose you as having a hernia?  8. OTHER SYMPTOMS: Do you have any other symptoms? (e.g., fever, abdomen pain, vomiting) Blood coming out of hernia.  Protocols used: Abdominal Pain - Upper-A-AH, Hernia-A-AH

## 2024-11-02 NOTE — Discharge Instructions (Addendum)
 Your CT scan was reassuring I discussed with the surgical on-call Dr. Everet if there is some black-tinged over the area he stated that they would want to try to avoid surgery unless she developed any fevers, vomiting, worsening pain or other concerns.  He will get you a follow-up with Dr. Jordis next week to reevaluate the area and decide on next steps.  We offered admission to the hospital for surgical team to evaluate you tomorrow but given he reports that he suspect that you will be able to be discharged home without any sort of physical invention you prefer to follow-up outpatient which I think is reasonable.  However if you develop any new symptoms such as vomiting, fevers, worsening abdominal pain or any other concerns and please return to the ER for evaluation  1. No aortic aneurysm or aortic dissection. The mesenteric and renal arteries are widely patent without acute thrombus. 2. Moderate sized left parastomal hernia containing fat and a short segment of the distal colon. No small bowel obstruction or findings of vascular compromise. 3. Scattered sigmoid diverticulosis. No changes of acute diverticulitis. 4. Mild hepatosplenomegaly. 5. Cholecystolithiasis. No changes of acute cholecystitis.

## 2024-11-02 NOTE — Telephone Encounter (Signed)
 Copied from CRM #[id number]. Topic: Clinical - Red Word Triage >> Nov 02, 2024 11:44 AM [name] ORN wrote: Red Word that prompted transfer to Nurse Triage: has a hernia that is coming out of her skin and is bleeding >> Nov 02, 2024 12:45 PM [name] POUR wrote: Natasha [name] states she is waiting for a ride to go to the emergency room.

## 2024-11-02 NOTE — ED Triage Notes (Signed)
 Pt BIB [facility] from home C/O bowel discoloration and protruding from ostomy site. Pt reports she has called GI doc several times since this began about 2 weeks ago and he told her that it was normal.

## 2024-11-02 NOTE — ED Notes (Addendum)
 First nurse note: To ED from home, [facility] for wound at stoma site (has colostomy). Has about 4 inches of bowel protruding through stoma that has green and black discoloration per EMS. This has been ongoing for 2-3 weeks and has spoken to PCP on phone several times and she was told the protruding bowel was normal. Today they advised to go to ER. Stool output is normal. Has intermittent pain.   EMS VS: HR 77, 100% RA, 105/65, 97.8

## 2024-11-02 NOTE — Telephone Encounter (Signed)
 Triage nurse called to let office know that patient refused to go to the hospital. Triage nurse stated at the time of call she did not have the patient on the line.

## 2024-11-02 NOTE — Telephone Encounter (Signed)
 Called to speak with Patient verified correct patient with name date of birth. Patient stated hernia sticking out of about length of hand and that there is blood coming from area and that there are clots forming. Patient advised nurse she had spoken with [phone] patient HX lymphoma) and that they advised patient no need to be evaluated. ( No documentation in chart of call to [facility].) Patient care giver took phone and reiterated that patient has bulging hernia with bleeding and bruising with clots, and lays crying at night. Advised caregiver on DPR patient needs to go be evaluated at ED. Care giver stated to patient that  the need for going to be evaluated. Patient at first stated no I am not going but then agreed to go to ED. Patient denies weakness or pain at this time but stated hernia is much larger and bulging.  Confirmed patient stated she would go to ED and Care giver confirmed.

## 2024-11-02 NOTE — ED Notes (Signed)
 Pt given DC instructions. Pt verbalized understanding of follow care. Pt taken from ED in wheelchair by this RN.

## 2024-11-02 NOTE — Telephone Encounter (Signed)
 Detailed vm left informing pt that it is best advised that she go to the ED.

## 2024-11-02 NOTE — ED Provider Notes (Signed)
 "  [facility] Provider Note    Event Date/Time   First MD Initiated Contact with Patient 11/02/24 1537     (approximate)   History   Abdominal Pain and Post-op Problem   HPI  Natasha [name] is a 79 y.o. 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  Patient reports about 4 inches of bowel protruding through her stoma that has been black tinged discoloration.  This she reports has been ongoing for 2 to 3 weeks and she spoke to her PCP on the phone several times they told her that protruding bowel was normal but today they told her to go to the ER to be evaluated does report normal stool output on the R, no fevers, no pain unless some mild pain with eating.   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  She had prior cecal volvulus back in 2024  Patient reports for about 2 to 3 weeks she has been having increasing pain when she tries to eat and she has noticed that her hernia has more bowel coming out of it and there is a slight discoloration noted to the intestines.  She reports that it appears black dusty in nature.  She reports having some liquidy bloody tinged discharge coming out of the inside of normal stool.   Physical Exam   Triage Vital Signs: ED Triage Vitals  Encounter Vitals Group     BP 11/02/24 1439 101/75     Girls Systolic BP Percentile --      Girls Diastolic BP Percentile --      Boys Systolic BP Percentile --      Boys Diastolic BP Percentile --      Pulse Rate 11/02/24 1439 77     Resp 11/02/24 1439 18     Temp 11/02/24 1439 98.9 F (37.2 C)     Temp Source 11/02/24 1439 Oral     SpO2 11/02/24 1439 98 %     Weight --      Height --      Head Circumference --      Peak Flow --      Pain Score 11/02/24 1436 0     Pain Loc --      Pain Education --      Exclude from Growth Chart --     Most recent vital signs: Vitals:   11/02/24 1439  BP:  101/75  Pulse: 77  Resp: 18  Temp: 98.9 F (37.2 C)  SpO2: 98%     General: Awake, no distress.  CV:  Good peripheral perfusion.  Resp:  Normal effort.  Abd:  No distention.  Other:  Patient's right bag appears normal with brown stool.  Left bag has bloody tinged hue and area of intestines that is protruding that is slightly discolored with blackish hue.   ED Results / Procedures / Treatments   Labs (all labs ordered are listed, but only abnormal results are displayed) Labs Reviewed  COMPREHENSIVE METABOLIC PANEL WITH GFR - Abnormal; Notable for the following components:      Result Value   Glucose, Bld 115 (*)    BUN 25 (*)    Creatinine, Ser 1.25 (*)    Total Protein 5.8 (*)    AST 12 (*)    Alkaline Phosphatase 165 (*)    GFR, Estimated 44 (*)    All other components within  normal limits  LIPASE, BLOOD  CBC  LACTIC ACID, PLASMA     RADIOLOGY I have reviewed the CT personally and interpreted  no kidney stone- waiting on rads read    PROCEDURES:  Critical Care performed: No  Procedures   MEDICATIONS ORDERED IN ED: Medications - No data to display   IMPRESSION / MDM / ASSESSMENT AND PLAN / ED COURSE  I reviewed the triage vital signs and the nursing notes.   Patient's presentation is most consistent with acute presentation with potential threat to life or bodily function.    Workup was done to evaluate for any infections and CT ordered evaluate for any ischemia, obstructions.  CBC is reassuring lipase is normal CMP shows slightly elevated creatinine at 1.25  Patient's hemoglobin was stable.  Given she has had some of this bloody discharge over the past 2 to 3 weeks I am reassured that patient's hemoglobin is stable therefore I do not see any signs of acute GI bleed that would need emergent intervention.  Patient also has no fever, lactate normal   IMPRESSION: 1. No aortic aneurysm or aortic dissection. The mesenteric and renal arteries are widely  patent without acute thrombus. 2. Moderate sized left parastomal hernia containing fat and a short segment of the distal colon. No small bowel obstruction or findings of vascular compromise. 3. Scattered sigmoid diverticulosis. No changes of acute diverticulitis. 4. Mild hepatosplenomegaly. 5. Cholecystolithiasis. No changes of acute cholecystitis.  Patient has no right upper quadrant tenderness to suggest acute cholecystitis.  Discussed the case with Dr. Marinda from surgical team. This is a parastomal hernia, and is a nonfunctionable end-therefore they would not do any intervention.  There is also no evidence of obstruction.  I did discuss with him again about the black tinge  parts on the intestine that is not exposed and asked that this was a risk for her getting worse or concerns for gangrene. Given has been there for 2 to 3 weeks that she has got no fever, no white count, normal lactate and CT angio shows good perfusion he does not feel like patient needs any emergent surgery.  He did state that if patient wanted to stay in the hospital he can evaluate patient tomorrow.   discussed with patient and she reports her symptoms have been stable for the past 2 to 3 weeks that she prefer to go home and does not want to stay in the hospital.  Given it is unlikely that any surgical intervention will be done emergently patient states that she prefers to go home.  Dr. Marinda will get her follow-up next week with Dr. Jordis to reevaluate the area.  I had extensive conversation with patient about return precautions in regards to fevers, vomiting, abdominal pain or any other concerns and she expressed understanding but preferred to follow-up outpatient given the CT overall is reassuring and this has been ongoing for a few weeks now.  6:17 PM out of precaution given patient did not want to stay in the hospital I did upload pictures to the chart for Dr. Marinda to review and he did not feel that there was anything  emergent that needed to be done and was okay with patient to be discharged home.  Look more consistent with some sloughing.  The patient is on the cardiac monitor to evaluate for evidence of arrhythmia and/or significant heart rate changes.      FINAL CLINICAL IMPRESSION(S) / ED DIAGNOSES   Final diagnoses:  Parastomal hernia without  obstruction or gangrene     Rx / DC Orders   ED Discharge Orders     None        Note:  This document was prepared using Dragon voice recognition software and may include unintentional dictation errors.   Ernest Ronal BRAVO, MD 11/02/24 1818  "

## 2024-11-02 NOTE — Telephone Encounter (Signed)
 Note added to original triage note

## 2024-11-07 ENCOUNTER — Ambulatory Visit: Payer: Self-pay

## 2024-11-07 NOTE — Telephone Encounter (Signed)
 FYI Only or Action Required?: FYI only for provider: ED advised. refused  Patient was last seen in primary care on 01/05/2023 by Gretel App, NP.  Called Nurse Triage reporting GI Problem.  Symptoms began today.  Interventions attempted: Nothing.  Symptoms are: gradually worsening.  Triage Disposition: Go to ED Now (Notify PCP)  Patient/caregiver understands and will follow disposition?: No, refuses disposition    Went to ED on 1/9 for issue with her colostomy after speaking with triagee. 4 inches protruding and bleeding. Ongoing 2-3 weeks. Pt states she was discharged home as she wasn't throwing up and didn't have a fever and no obstruction, CT reassuring at that time.   Pt calling back to report worse pain. Hernia on left side of abdomen sticking out 4 inches, colostomy on the right is fine. 9/10 pain with movement, tender to touch. Reports it is bleeding a lot through the stoma. Triage cut short d/t severity of pt symptoms. Advised ED, pt declines. States they told her there was nothing they could do. Explained pts symptoms could not be treated in office.   Called [facility] and spoke with [name] to notify of ED refusal. Pt states she wants to try calling a different doctor to schedule an appt. Explained they likely could not treat symptoms in office either. Pt continues to decline. Advised pt go to ED or call 911 for worsening symptoms.     Copied from CRM [id number]. Topic: Clinical - Red Word Triage >> Nov 07, 2024  4:25 PM Adelita E wrote: [facility] that prompted transfer to Nurse Triage: Hernia on abdomen, patient stated that it is still bleeding even after she went to the ED. Reason for Disposition  SEVERE abdominal pain  Hernia is painful or tender to touch  Answer Assessment - Initial Assessment Questions 1. ONSET:  When did this first appear?     *No Answer*  2. APPEARANCE: What does it look like?     *No Answer*  3. SIZE: How big is it? (e.g., inches, cm; or  compare to coins, fruit)     Protruding 4 inches  4. LOCATION: Where exactly is the hernia located?     Left abdomen  5. PATTERN: Does the swelling come and go, or has it been constant since it started?     *No Answer*  6. PAIN: Is there any pain? If Yes, ask: How bad is it?  (Scale 0-10; or none, mild, moderate, severe)     9/10 with certain movements, painful when touched.  7. DIAGNOSIS: Have you been seen by a doctor (or NP/PA) for this? Did the doctor diagnose you as having a hernia?     Went to ED and it was dx on 1/9  8. OTHER SYMPTOMS: Do you have any other symptoms? (e.g., fever, abdomen pain, vomiting)     *No Answer*  Protocols used: Hernia-A-AH

## 2024-11-08 NOTE — Telephone Encounter (Signed)
 Detailed vm left informing pt that she should go to the ED

## 2024-11-12 ENCOUNTER — Encounter: Payer: Self-pay | Admitting: Oncology

## 2024-11-16 ENCOUNTER — Telehealth: Payer: Self-pay | Admitting: Nurse Practitioner

## 2024-11-16 NOTE — Telephone Encounter (Signed)
 Copied from CRM #[id number]. Topic: General - Other >> Nov 16, 2024 11:35 AM Avram MATSU wrote: Reason for CRM: patient applied to chronic condition special needs plan and would confirm if the patient has congestive heart failure and diabetes, please advise [phone] option 4

## 2024-11-19 ENCOUNTER — Ambulatory Visit: Admitting: Surgery

## 2024-11-23 ENCOUNTER — Telehealth: Payer: Self-pay

## 2024-11-23 NOTE — Telephone Encounter (Signed)
 [employer] forms placed in provider folder

## 2024-11-28 ENCOUNTER — Ambulatory Visit: Admitting: Surgery

## 2024-11-30 NOTE — Telephone Encounter (Signed)
 faxed

## 2024-12-05 ENCOUNTER — Ambulatory Visit: Admitting: Surgery

## 2024-12-10 ENCOUNTER — Ambulatory Visit: Admitting: Surgery

## 2024-12-17 ENCOUNTER — Ambulatory Visit: Admitting: Podiatry
